# Patient Record
Sex: Female | Born: 1950 | Race: White | Hispanic: No | Marital: Married | Smoking: Former smoker
Health system: Southern US, Community
[De-identification: ages and names within clinical notes are randomized; demographics above are authoritative.]

## PROBLEM LIST (undated history)

## (undated) DIAGNOSIS — I10 Essential (primary) hypertension: Secondary | ICD-10-CM

## (undated) DIAGNOSIS — D649 Anemia, unspecified: Secondary | ICD-10-CM

## (undated) DIAGNOSIS — M545 Low back pain, unspecified: Secondary | ICD-10-CM

## (undated) DIAGNOSIS — K219 Gastro-esophageal reflux disease without esophagitis: Secondary | ICD-10-CM

## (undated) DIAGNOSIS — E785 Hyperlipidemia, unspecified: Secondary | ICD-10-CM

## (undated) DIAGNOSIS — F419 Anxiety disorder, unspecified: Secondary | ICD-10-CM

## (undated) DIAGNOSIS — G4700 Insomnia, unspecified: Secondary | ICD-10-CM

## (undated) DIAGNOSIS — G459 Transient cerebral ischemic attack, unspecified: Secondary | ICD-10-CM

## (undated) DIAGNOSIS — E538 Deficiency of other specified B group vitamins: Secondary | ICD-10-CM

## (undated) DIAGNOSIS — E559 Vitamin D deficiency, unspecified: Secondary | ICD-10-CM

## (undated) DIAGNOSIS — K589 Irritable bowel syndrome without diarrhea: Secondary | ICD-10-CM

## (undated) HISTORY — PX: BACK SURGERY: SHX140

## (undated) HISTORY — DX: Anxiety disorder, unspecified: F41.9

## (undated) HISTORY — PX: TONSILLECTOMY: SUR1361

## (undated) HISTORY — DX: Gastro-esophageal reflux disease without esophagitis: K21.9

## (undated) HISTORY — DX: Vitamin D deficiency, unspecified: E55.9

## (undated) HISTORY — DX: Deficiency of other specified B group vitamins: E53.8

## (undated) HISTORY — DX: Insomnia, unspecified: G47.00

## (undated) HISTORY — DX: Transient cerebral ischemic attack, unspecified: G45.9

## (undated) HISTORY — DX: Low back pain, unspecified: M54.50

## (undated) HISTORY — DX: Hyperlipidemia, unspecified: E78.5

## (undated) HISTORY — DX: Low back pain: M54.5

## (undated) HISTORY — DX: Essential (primary) hypertension: I10

## (undated) HISTORY — DX: Irritable bowel syndrome, unspecified: K58.9

## (undated) HISTORY — PX: ORIF RADIUS & ULNA FRACTURES: SHX2129

## (undated) HISTORY — PX: HERNIA MESH REMOVAL: SHX1745

## (undated) HISTORY — PX: OTHER SURGICAL HISTORY: SHX169

---

## 1995-01-09 HISTORY — PX: BREAST EXCISIONAL BIOPSY: SUR124

## 1998-12-05 ENCOUNTER — Other Ambulatory Visit: Admission: RE | Admit: 1998-12-05 | Discharge: 1998-12-05 | Payer: Self-pay | Admitting: Family Medicine

## 1998-12-26 ENCOUNTER — Ambulatory Visit (HOSPITAL_COMMUNITY): Admission: RE | Admit: 1998-12-26 | Discharge: 1998-12-26 | Payer: Self-pay | Admitting: Gastroenterology

## 1999-11-13 ENCOUNTER — Other Ambulatory Visit: Admission: RE | Admit: 1999-11-13 | Discharge: 1999-11-13 | Payer: Self-pay | Admitting: Internal Medicine

## 2000-04-30 ENCOUNTER — Encounter: Payer: Self-pay | Admitting: Gastroenterology

## 2000-04-30 ENCOUNTER — Encounter: Admission: RE | Admit: 2000-04-30 | Discharge: 2000-04-30 | Payer: Self-pay | Admitting: Gastroenterology

## 2000-10-04 ENCOUNTER — Ambulatory Visit (HOSPITAL_COMMUNITY): Admission: RE | Admit: 2000-10-04 | Discharge: 2000-10-04 | Payer: Self-pay | Admitting: Family Medicine

## 2000-10-04 ENCOUNTER — Encounter: Payer: Self-pay | Admitting: Family Medicine

## 2002-09-04 ENCOUNTER — Ambulatory Visit (HOSPITAL_COMMUNITY): Admission: RE | Admit: 2002-09-04 | Discharge: 2002-09-04 | Payer: Self-pay | Admitting: Gastroenterology

## 2003-01-15 ENCOUNTER — Other Ambulatory Visit: Admission: RE | Admit: 2003-01-15 | Discharge: 2003-01-15 | Payer: Self-pay | Admitting: Family Medicine

## 2003-08-26 ENCOUNTER — Ambulatory Visit (HOSPITAL_COMMUNITY): Admission: RE | Admit: 2003-08-26 | Discharge: 2003-08-26 | Payer: Self-pay | Admitting: Family Medicine

## 2003-10-11 ENCOUNTER — Encounter: Admission: RE | Admit: 2003-10-11 | Discharge: 2003-10-11 | Payer: Self-pay | Admitting: Family Medicine

## 2003-12-23 ENCOUNTER — Encounter: Admission: RE | Admit: 2003-12-23 | Discharge: 2003-12-23 | Payer: Self-pay | Admitting: Family Medicine

## 2004-01-20 ENCOUNTER — Other Ambulatory Visit: Admission: RE | Admit: 2004-01-20 | Discharge: 2004-01-20 | Payer: Self-pay | Admitting: Family Medicine

## 2004-08-03 ENCOUNTER — Encounter: Admission: RE | Admit: 2004-08-03 | Discharge: 2004-08-03 | Payer: Self-pay | Admitting: Family Medicine

## 2005-01-19 ENCOUNTER — Other Ambulatory Visit: Admission: RE | Admit: 2005-01-19 | Discharge: 2005-01-19 | Payer: Self-pay | Admitting: Family Medicine

## 2005-06-25 ENCOUNTER — Encounter: Admission: RE | Admit: 2005-06-25 | Discharge: 2005-06-25 | Payer: Self-pay | Admitting: Orthopaedic Surgery

## 2005-08-07 ENCOUNTER — Encounter: Admission: RE | Admit: 2005-08-07 | Discharge: 2005-08-07 | Payer: Self-pay | Admitting: Family Medicine

## 2006-01-22 ENCOUNTER — Encounter: Admission: RE | Admit: 2006-01-22 | Discharge: 2006-01-22 | Payer: Self-pay | Admitting: Gastroenterology

## 2006-01-22 ENCOUNTER — Other Ambulatory Visit: Admission: RE | Admit: 2006-01-22 | Discharge: 2006-01-22 | Payer: Self-pay | Admitting: Family Medicine

## 2006-11-29 ENCOUNTER — Encounter: Admission: RE | Admit: 2006-11-29 | Discharge: 2006-11-29 | Payer: Self-pay | Admitting: Family Medicine

## 2007-01-30 ENCOUNTER — Other Ambulatory Visit: Admission: RE | Admit: 2007-01-30 | Discharge: 2007-01-30 | Payer: Self-pay | Admitting: Family Medicine

## 2008-02-03 ENCOUNTER — Other Ambulatory Visit: Admission: RE | Admit: 2008-02-03 | Discharge: 2008-02-03 | Payer: Self-pay | Admitting: Family Medicine

## 2008-02-24 ENCOUNTER — Encounter: Admission: RE | Admit: 2008-02-24 | Discharge: 2008-02-24 | Payer: Self-pay | Admitting: Family Medicine

## 2008-11-08 ENCOUNTER — Inpatient Hospital Stay (HOSPITAL_COMMUNITY): Admission: AC | Admit: 2008-11-08 | Discharge: 2008-11-15 | Payer: Self-pay

## 2008-11-10 ENCOUNTER — Ambulatory Visit: Payer: Self-pay | Admitting: Physical Medicine & Rehabilitation

## 2009-02-04 ENCOUNTER — Other Ambulatory Visit: Admission: RE | Admit: 2009-02-04 | Discharge: 2009-02-04 | Payer: Self-pay | Admitting: Family Medicine

## 2009-02-24 ENCOUNTER — Ambulatory Visit (HOSPITAL_COMMUNITY): Admission: RE | Admit: 2009-02-24 | Discharge: 2009-02-24 | Payer: Self-pay | Admitting: Neurosurgery

## 2009-02-25 ENCOUNTER — Encounter (INDEPENDENT_AMBULATORY_CARE_PROVIDER_SITE_OTHER): Payer: Self-pay | Admitting: Neurosurgery

## 2009-03-11 ENCOUNTER — Encounter: Admission: RE | Admit: 2009-03-11 | Discharge: 2009-03-11 | Payer: Self-pay | Admitting: Family Medicine

## 2009-03-15 ENCOUNTER — Encounter: Admission: RE | Admit: 2009-03-15 | Discharge: 2009-03-15 | Payer: Self-pay | Admitting: Family Medicine

## 2009-04-30 ENCOUNTER — Emergency Department (HOSPITAL_COMMUNITY): Admission: EM | Admit: 2009-04-30 | Discharge: 2009-04-30 | Payer: Self-pay | Admitting: Emergency Medicine

## 2010-02-03 ENCOUNTER — Other Ambulatory Visit: Payer: Self-pay | Admitting: Family Medicine

## 2010-02-03 DIAGNOSIS — Z1239 Encounter for other screening for malignant neoplasm of breast: Secondary | ICD-10-CM

## 2010-03-15 ENCOUNTER — Ambulatory Visit
Admission: RE | Admit: 2010-03-15 | Discharge: 2010-03-15 | Disposition: A | Discharge status: Home / Self Care | Payer: Commercial Indemnity | Source: Ambulatory Visit | Attending: Family Medicine | Admitting: Family Medicine

## 2010-03-15 DIAGNOSIS — Z1239 Encounter for other screening for malignant neoplasm of breast: Secondary | ICD-10-CM

## 2010-03-29 LAB — HEPATIC FUNCTION PANEL
ALT: 16 U/L (ref 0–35)
AST: 16 U/L (ref 0–37)
Albumin: 4.5 g/dL (ref 3.5–5.2)
Alkaline Phosphatase: 55 U/L (ref 39–117)
Bilirubin, Direct: 0.2 mg/dL (ref 0.0–0.3)
Indirect Bilirubin: 0.2 mg/dL — ABNORMAL LOW (ref 0.3–0.9)
Total Bilirubin: 0.4 mg/dL (ref 0.3–1.2)
Total Protein: 6.9 g/dL (ref 6.0–8.3)

## 2010-03-29 LAB — CBC
HCT: 44 % (ref 36.0–46.0)
Hemoglobin: 15.4 g/dL — ABNORMAL HIGH (ref 12.0–15.0)
MCHC: 34.8 g/dL (ref 30.0–36.0)
MCV: 105 fL — ABNORMAL HIGH (ref 78.0–100.0)
Platelets: 274 10*3/uL (ref 150–400)
RBC: 4.2 MIL/uL (ref 3.87–5.11)
RDW: 14.1 % (ref 11.5–15.5)
WBC: 7 10*3/uL (ref 4.0–10.5)

## 2010-03-29 LAB — BASIC METABOLIC PANEL
BUN: 5 mg/dL — ABNORMAL LOW (ref 6–23)
CO2: 27 mEq/L (ref 19–32)
Calcium: 9.5 mg/dL (ref 8.4–10.5)
Chloride: 92 mEq/L — ABNORMAL LOW (ref 96–112)
Creatinine, Ser: 0.65 mg/dL (ref 0.4–1.2)
GFR calc Af Amer: >60 mL/min (ref >60)
GFR calc non Af Amer: >60 mL/min (ref >60)
Glucose, Bld: 105 mg/dL — ABNORMAL HIGH (ref 70–99)
Potassium: 4.3 mEq/L (ref 3.5–5.1)
Sodium: 128 mEq/L — ABNORMAL LOW (ref 135–145)

## 2010-03-29 LAB — SURGICAL PCR SCREEN
MRSA, PCR: NEGATIVE
Staphylococcus aureus: NEGATIVE

## 2010-03-29 LAB — DIFFERENTIAL
Basophils Absolute: 0 10*3/uL (ref 0.0–0.1)
Basophils Relative: 0 % (ref 0–1)
Eosinophils Absolute: 0.5 10*3/uL (ref 0.0–0.7)
Eosinophils Relative: 7 % — ABNORMAL HIGH (ref 0–5)
Lymphocytes Relative: 18 % (ref 12–46)
Lymphs Abs: 1.3 10*3/uL (ref 0.7–4.0)
Monocytes Absolute: 0.3 10*3/uL (ref 0.1–1.0)
Monocytes Relative: 5 % (ref 3–12)
Neutro Abs: 4.8 10*3/uL (ref 1.7–7.7)
Neutrophils Relative %: 70 % (ref 43–77)

## 2010-04-12 LAB — CBC
HCT: 31.2 % — ABNORMAL LOW (ref 36.0–46.0)
HCT: 35.2 % — ABNORMAL LOW (ref 36.0–46.0)
HCT: 35.8 % — ABNORMAL LOW (ref 36.0–46.0)
HCT: 35.9 % — ABNORMAL LOW (ref 36.0–46.0)
HCT: 36 % (ref 36.0–46.0)
HCT: 36 % (ref 36.0–46.0)
HCT: 36.3 % (ref 36.0–46.0)
HCT: 36.5 % (ref 36.0–46.0)
HCT: 36.5 % (ref 36.0–46.0)
Hemoglobin: 11.2 g/dL — ABNORMAL LOW (ref 12.0–15.0)
Hemoglobin: 12.6 g/dL (ref 12.0–15.0)
Hemoglobin: 12.7 g/dL (ref 12.0–15.0)
Hemoglobin: 12.8 g/dL (ref 12.0–15.0)
Hemoglobin: 12.9 g/dL (ref 12.0–15.0)
Hemoglobin: 12.9 g/dL (ref 12.0–15.0)
Hemoglobin: 12.9 g/dL (ref 12.0–15.0)
Hemoglobin: 13 g/dL (ref 12.0–15.0)
Hemoglobin: 13 g/dL (ref 12.0–15.0)
MCHC: 35.4 g/dL (ref 30.0–36.0)
MCHC: 35.6 g/dL (ref 30.0–36.0)
MCHC: 35.6 g/dL (ref 30.0–36.0)
MCHC: 35.6 g/dL (ref 30.0–36.0)
MCHC: 35.7 g/dL (ref 30.0–36.0)
MCHC: 35.8 g/dL (ref 30.0–36.0)
MCHC: 35.8 g/dL (ref 30.0–36.0)
MCHC: 35.8 g/dL (ref 30.0–36.0)
MCHC: 35.9 g/dL (ref 30.0–36.0)
MCV: 103 fL — ABNORMAL HIGH (ref 78.0–100.0)
MCV: 103.5 fL — ABNORMAL HIGH (ref 78.0–100.0)
MCV: 103.7 fL — ABNORMAL HIGH (ref 78.0–100.0)
MCV: 103.7 fL — ABNORMAL HIGH (ref 78.0–100.0)
MCV: 103.8 fL — ABNORMAL HIGH (ref 78.0–100.0)
MCV: 104 fL — ABNORMAL HIGH (ref 78.0–100.0)
MCV: 104 fL — ABNORMAL HIGH (ref 78.0–100.0)
MCV: 104.4 fL — ABNORMAL HIGH (ref 78.0–100.0)
MCV: 104.8 fL — ABNORMAL HIGH (ref 78.0–100.0)
Platelets: 136 10*3/uL — ABNORMAL LOW (ref 150–400)
Platelets: 152 10*3/uL (ref 150–400)
Platelets: 158 10*3/uL (ref 150–400)
Platelets: 160 10*3/uL (ref 150–400)
Platelets: 161 10*3/uL (ref 150–400)
Platelets: 168 10*3/uL (ref 150–400)
Platelets: 177 10*3/uL (ref 150–400)
Platelets: 180 10*3/uL (ref 150–400)
Platelets: 181 10*3/uL (ref 150–400)
RBC: 3.01 MIL/uL — ABNORMAL LOW (ref 3.87–5.11)
RBC: 3.4 MIL/uL — ABNORMAL LOW (ref 3.87–5.11)
RBC: 3.43 MIL/uL — ABNORMAL LOW (ref 3.87–5.11)
RBC: 3.46 MIL/uL — ABNORMAL LOW (ref 3.87–5.11)
RBC: 3.47 MIL/uL — ABNORMAL LOW (ref 3.87–5.11)
RBC: 3.47 MIL/uL — ABNORMAL LOW (ref 3.87–5.11)
RBC: 3.47 MIL/uL — ABNORMAL LOW (ref 3.87–5.11)
RBC: 3.51 MIL/uL — ABNORMAL LOW (ref 3.87–5.11)
RBC: 3.55 MIL/uL — ABNORMAL LOW (ref 3.87–5.11)
RDW: 13.4 % (ref 11.5–15.5)
RDW: 13.4 % (ref 11.5–15.5)
RDW: 13.5 % (ref 11.5–15.5)
RDW: 13.6 % (ref 11.5–15.5)
RDW: 13.8 % (ref 11.5–15.5)
RDW: 13.8 % (ref 11.5–15.5)
RDW: 13.9 % (ref 11.5–15.5)
RDW: 13.9 % (ref 11.5–15.5)
RDW: 14.3 % (ref 11.5–15.5)
WBC: 5.4 10*3/uL (ref 4.0–10.5)
WBC: 5.4 10*3/uL (ref 4.0–10.5)
WBC: 5.6 10*3/uL (ref 4.0–10.5)
WBC: 6.1 10*3/uL (ref 4.0–10.5)
WBC: 6.2 10*3/uL (ref 4.0–10.5)
WBC: 6.4 10*3/uL (ref 4.0–10.5)
WBC: 6.9 10*3/uL (ref 4.0–10.5)
WBC: 7.2 10*3/uL (ref 4.0–10.5)
WBC: 7.6 10*3/uL (ref 4.0–10.5)

## 2010-04-12 LAB — BASIC METABOLIC PANEL
BUN: 12 mg/dL (ref 6–23)
BUN: 3 mg/dL — ABNORMAL LOW (ref 6–23)
CO2: 23 mEq/L (ref 19–32)
CO2: 28 mEq/L (ref 19–32)
Calcium: 8 mg/dL — ABNORMAL LOW (ref 8.4–10.5)
Calcium: 8.4 mg/dL (ref 8.4–10.5)
Chloride: 103 mEq/L (ref 96–112)
Chloride: 107 mEq/L (ref 96–112)
Creatinine, Ser: 0.63 mg/dL (ref 0.4–1.2)
Creatinine, Ser: 0.65 mg/dL (ref 0.4–1.2)
GFR calc Af Amer: >60 mL/min (ref >60)
GFR calc Af Amer: >60 mL/min (ref >60)
GFR calc non Af Amer: >60 mL/min (ref >60)
GFR calc non Af Amer: >60 mL/min (ref >60)
Glucose, Bld: 109 mg/dL — ABNORMAL HIGH (ref 70–99)
Glucose, Bld: 136 mg/dL — ABNORMAL HIGH (ref 70–99)
Potassium: 3.7 mEq/L (ref 3.5–5.1)
Potassium: 4 mEq/L (ref 3.5–5.1)
Sodium: 136 mEq/L (ref 135–145)
Sodium: 137 mEq/L (ref 135–145)

## 2010-04-12 LAB — DIFFERENTIAL
Basophils Absolute: 0 10*3/uL (ref 0.0–0.1)
Basophils Relative: 0 % (ref 0–1)
Eosinophils Absolute: 0.1 10*3/uL (ref 0.0–0.7)
Eosinophils Relative: 2 % (ref 0–5)
Lymphocytes Relative: 13 % (ref 12–46)
Lymphs Abs: 0.7 10*3/uL (ref 0.7–4.0)
Monocytes Absolute: 0.2 10*3/uL (ref 0.1–1.0)
Monocytes Relative: 4 % (ref 3–12)
Neutro Abs: 4.4 10*3/uL (ref 1.7–7.7)
Neutrophils Relative %: 81 % — ABNORMAL HIGH (ref 43–77)

## 2010-04-12 LAB — MRSA PCR SCREENING: MRSA by PCR: NEGATIVE

## 2010-04-12 LAB — GLUCOSE, CAPILLARY: Glucose-Capillary: 130 mg/dL — ABNORMAL HIGH (ref 70–99)

## 2010-04-13 LAB — TYPE AND SCREEN
ABO/RH(D): O POS
Antibody Screen: NEGATIVE

## 2010-04-13 LAB — COMPREHENSIVE METABOLIC PANEL
ALT: 136 U/L — ABNORMAL HIGH (ref 0–35)
AST: 182 U/L — ABNORMAL HIGH (ref 0–37)
Albumin: 4.1 g/dL (ref 3.5–5.2)
Alkaline Phosphatase: 46 U/L (ref 39–117)
BUN: 15 mg/dL (ref 6–23)
CO2: 23 mEq/L (ref 19–32)
Calcium: 8.9 mg/dL (ref 8.4–10.5)
Chloride: 102 mEq/L (ref 96–112)
Creatinine, Ser: 0.89 mg/dL (ref 0.4–1.2)
GFR calc Af Amer: >60 mL/min (ref >60)
GFR calc non Af Amer: >60 mL/min (ref >60)
Glucose, Bld: 117 mg/dL — ABNORMAL HIGH (ref 70–99)
Potassium: 3.8 mEq/L (ref 3.5–5.1)
Sodium: 136 mEq/L (ref 135–145)
Total Bilirubin: 0.5 mg/dL (ref 0.3–1.2)
Total Protein: 6.4 g/dL (ref 6.0–8.3)

## 2010-04-13 LAB — CBC
HCT: 43.2 % (ref 36.0–46.0)
Hemoglobin: 15.3 g/dL — ABNORMAL HIGH (ref 12.0–15.0)
MCHC: 35.3 g/dL (ref 30.0–36.0)
MCV: 105.3 fL — ABNORMAL HIGH (ref 78.0–100.0)
Platelets: 253 10*3/uL (ref 150–400)
RBC: 4.1 MIL/uL (ref 3.87–5.11)
RDW: 14 % (ref 11.5–15.5)
WBC: 15.2 10*3/uL — ABNORMAL HIGH (ref 4.0–10.5)

## 2010-04-13 LAB — POCT I-STAT, CHEM 8
BUN: 18 mg/dL (ref 6–23)
Calcium, Ion: 0.8 mmol/L — ABNORMAL LOW (ref 1.12–1.32)
Chloride: 105 meq/L (ref 96–112)
Creatinine, Ser: 0.6 mg/dL (ref 0.4–1.2)
Glucose, Bld: 105 mg/dL — ABNORMAL HIGH (ref 70–99)
HCT: 44 % (ref 36.0–46.0)
Hemoglobin: 15 g/dL (ref 12.0–15.0)
Potassium: 4.1 mEq/L (ref 3.5–5.1)
Sodium: 132 mEq/L — ABNORMAL LOW (ref 135–145)
TCO2: 21 mmol/L (ref 0–100)

## 2010-04-13 LAB — PROTIME-INR
INR: 1.04 (ref 0.00–1.49)
Prothrombin Time: 13.5 seconds (ref 11.6–15.2)

## 2010-04-13 LAB — APTT: aPTT: 28 seconds (ref 24–37)

## 2010-04-13 LAB — LACTIC ACID, PLASMA: Lactic Acid, Venous: 2.6 mmol/L — ABNORMAL HIGH (ref 0.5–2.2)

## 2010-04-13 LAB — ABO/RH: ABO/RH(D): O POS

## 2010-05-26 NOTE — Op Note (Signed)
NAME:  Patricia [name], Patricia [name]                         ACCOUNT NO.:  [id number]   MEDICAL RECORD NO.:  [id number]                   PATIENT TYPE:  AMB   LOCATION:  ENDO                                 FACILITY:  [facility]   PHYSICIAN:  Petra Kuba, M.D.                 DATE OF BIRTH:  1950-10-11   DATE OF PROCEDURE:  09/04/2002  DATE OF DISCHARGE:                                 OPERATIVE REPORT   PROCEDURE:  Colonoscopy.   INDICATIONS FOR PROCEDURE:  Patient due for colonic screening, increased GI  symptoms probably related to her IBS, family history of colon polyps.   Consent was signed after risks, benefits, methods, and options were  thoroughly discussed in the office on multiple occasions.   MEDICINES USED:  Demerol 90, Versed 9.   DESCRIPTION OF PROCEDURE:  Rectal inspection was pertinent for small  external hemorrhoids. Digital exam was negative. The pediatric video  adjustable colonoscope was inserted and anal rectal pull through and  retroflexion confirmed some tiny to small hemorrhoids but no other anal  rectal abnormality. The scope was then straightened and despite a very  tortuous long looping colon was able to move into the cecum. This did  require various abdominal pressures and rolling her on her back. No obvious  abnormality was seen on insertion. The cecum was identified by the  appendiceal orifice and the ileocecal valve. In fact, the scope was inserted  a short ways into the terminal ileum which was normal. Photo documentation  was obtained. The scope was slowly withdrawn.  On slow withdrawal through  the colon, no abnormalities were seen. When we fell back around the loop, we  did try to readvance around the curves to decrease chances of missing things  but on slow withdrawal back to the rectum no abnormalities were seen. Again  anal rectal pull through and retroflexion did not reveal any additional  findings other than some tiny to small hemorrhoids. The scope was  reinserted  a short ways up the left side of the colon, air was suctioned, scope  removed. The patient tolerated the procedure well. There was no obvious or  immediate complications.   ENDOSCOPIC DIAGNOSIS:  1. Internal and external small hemorrhoids.  2. Long looping tortuous colon.  3. Otherwise within normal limits to the terminal ileum.    PLAN:  Happy to see back p.r.n., restart her usual medicines, consider  surgical consultation if her hemorrhoids continue to bother her. Repeat  screening probably in five years.                                               Petra Kuba, M.D.    MEM/MEDQ  D:  09/04/2002  T:  09/05/2002  Job:  [id number]   cc:  Holley Bouche, M.D.  [address]  [place], [place] [address]  Fax: [phone]

## 2010-12-11 ENCOUNTER — Other Ambulatory Visit: Payer: Self-pay | Admitting: Family Medicine

## 2010-12-11 DIAGNOSIS — N644 Mastodynia: Secondary | ICD-10-CM

## 2010-12-11 DIAGNOSIS — M542 Cervicalgia: Secondary | ICD-10-CM

## 2010-12-11 DIAGNOSIS — G8929 Other chronic pain: Secondary | ICD-10-CM

## 2010-12-18 ENCOUNTER — Ambulatory Visit
Admission: RE | Admit: 2010-12-18 | Discharge: 2010-12-18 | Disposition: A | Discharge status: Home / Self Care | Payer: Managed Care, Other (non HMO) | Source: Ambulatory Visit | Attending: Family Medicine | Admitting: Family Medicine

## 2010-12-18 DIAGNOSIS — G8929 Other chronic pain: Secondary | ICD-10-CM

## 2010-12-20 ENCOUNTER — Ambulatory Visit
Admission: RE | Admit: 2010-12-20 | Discharge: 2010-12-20 | Disposition: A | Discharge status: Home / Self Care | Payer: Managed Care, Other (non HMO) | Source: Ambulatory Visit | Attending: Family Medicine | Admitting: Family Medicine

## 2010-12-20 ENCOUNTER — Other Ambulatory Visit: Payer: Self-pay | Admitting: Family Medicine

## 2010-12-20 DIAGNOSIS — N644 Mastodynia: Secondary | ICD-10-CM

## 2011-09-21 ENCOUNTER — Other Ambulatory Visit: Payer: Self-pay | Admitting: Family Medicine

## 2011-09-21 DIAGNOSIS — Z1231 Encounter for screening mammogram for malignant neoplasm of breast: Secondary | ICD-10-CM

## 2011-10-16 ENCOUNTER — Ambulatory Visit
Admission: RE | Admit: 2011-10-16 | Discharge: 2011-10-16 | Disposition: A | Discharge status: Home / Self Care | Payer: Managed Care, Other (non HMO) | Source: Ambulatory Visit | Attending: Family Medicine | Admitting: Family Medicine

## 2011-10-16 DIAGNOSIS — Z1231 Encounter for screening mammogram for malignant neoplasm of breast: Secondary | ICD-10-CM

## 2012-02-18 ENCOUNTER — Other Ambulatory Visit (HOSPITAL_COMMUNITY)
Admission: RE | Admit: 2012-02-18 | Discharge: 2012-02-18 | Disposition: A | Discharge status: Home / Self Care | Payer: BC Managed Care – PPO | Source: Ambulatory Visit | Attending: Family Medicine | Admitting: Family Medicine

## 2012-02-18 ENCOUNTER — Other Ambulatory Visit: Payer: Self-pay | Admitting: Family Medicine

## 2012-02-18 DIAGNOSIS — Z01419 Encounter for gynecological examination (general) (routine) without abnormal findings: Secondary | ICD-10-CM | POA: Insufficient documentation

## 2012-09-16 ENCOUNTER — Other Ambulatory Visit: Payer: Self-pay

## 2012-09-16 DIAGNOSIS — Z1231 Encounter for screening mammogram for malignant neoplasm of breast: Secondary | ICD-10-CM

## 2012-10-02 ENCOUNTER — Other Ambulatory Visit: Payer: Self-pay | Admitting: Gastroenterology

## 2012-10-16 ENCOUNTER — Ambulatory Visit
Admission: RE | Admit: 2012-10-16 | Discharge: 2012-10-16 | Disposition: A | Discharge status: Home / Self Care | Payer: BC Managed Care – PPO | Source: Ambulatory Visit

## 2012-10-16 DIAGNOSIS — Z1231 Encounter for screening mammogram for malignant neoplasm of breast: Secondary | ICD-10-CM

## 2013-10-09 ENCOUNTER — Other Ambulatory Visit: Payer: Self-pay

## 2013-10-09 DIAGNOSIS — Z1231 Encounter for screening mammogram for malignant neoplasm of breast: Secondary | ICD-10-CM

## 2013-10-23 ENCOUNTER — Ambulatory Visit
Admission: RE | Admit: 2013-10-23 | Discharge: 2013-10-23 | Disposition: A | Discharge status: Home / Self Care | Payer: BC Managed Care – PPO | Source: Ambulatory Visit

## 2013-10-23 DIAGNOSIS — Z1231 Encounter for screening mammogram for malignant neoplasm of breast: Secondary | ICD-10-CM

## 2014-09-24 ENCOUNTER — Other Ambulatory Visit: Payer: Self-pay

## 2014-09-24 DIAGNOSIS — Z1231 Encounter for screening mammogram for malignant neoplasm of breast: Secondary | ICD-10-CM

## 2014-09-28 ENCOUNTER — Other Ambulatory Visit: Payer: Self-pay | Admitting: Family Medicine

## 2014-09-28 DIAGNOSIS — E2839 Other primary ovarian failure: Secondary | ICD-10-CM

## 2014-10-27 ENCOUNTER — Ambulatory Visit
Admission: RE | Admit: 2014-10-27 | Discharge: 2014-10-27 | Disposition: A | Discharge status: Home / Self Care | Payer: Medicare Other | Source: Ambulatory Visit

## 2014-10-27 DIAGNOSIS — Z1231 Encounter for screening mammogram for malignant neoplasm of breast: Secondary | ICD-10-CM

## 2014-11-03 ENCOUNTER — Ambulatory Visit
Admission: RE | Admit: 2014-11-03 | Discharge: 2014-11-03 | Disposition: A | Discharge status: Home / Self Care | Payer: Medicare Other | Source: Ambulatory Visit | Attending: Family Medicine | Admitting: Family Medicine

## 2014-11-03 DIAGNOSIS — E2839 Other primary ovarian failure: Secondary | ICD-10-CM

## 2014-12-15 ENCOUNTER — Ambulatory Visit
Admission: RE | Admit: 2014-12-15 | Discharge: 2014-12-15 | Disposition: A | Discharge status: Home / Self Care | Payer: Medicare Other | Source: Ambulatory Visit | Attending: Family Medicine | Admitting: Family Medicine

## 2015-03-11 ENCOUNTER — Other Ambulatory Visit: Payer: Self-pay | Admitting: Family Medicine

## 2015-03-11 ENCOUNTER — Other Ambulatory Visit (HOSPITAL_COMMUNITY)
Admission: RE | Admit: 2015-03-11 | Discharge: 2015-03-11 | Disposition: A | Discharge status: Home / Self Care | Payer: Medicare Other | Source: Ambulatory Visit | Attending: Family Medicine | Admitting: Family Medicine

## 2015-03-11 DIAGNOSIS — Z124 Encounter for screening for malignant neoplasm of cervix: Secondary | ICD-10-CM | POA: Diagnosis present

## 2015-03-14 LAB — CYTOLOGY - PAP

## 2015-09-27 ENCOUNTER — Other Ambulatory Visit: Payer: Self-pay | Admitting: Family Medicine

## 2015-09-27 DIAGNOSIS — Z1231 Encounter for screening mammogram for malignant neoplasm of breast: Secondary | ICD-10-CM

## 2015-10-28 ENCOUNTER — Ambulatory Visit
Admission: RE | Admit: 2015-10-28 | Discharge: 2015-10-28 | Disposition: A | Discharge status: Home / Self Care | Payer: Medicare Other | Source: Ambulatory Visit | Attending: Family Medicine | Admitting: Family Medicine

## 2015-10-28 DIAGNOSIS — Z1231 Encounter for screening mammogram for malignant neoplasm of breast: Secondary | ICD-10-CM

## 2016-01-27 DIAGNOSIS — K581 Irritable bowel syndrome with constipation: Secondary | ICD-10-CM | POA: Diagnosis not present

## 2016-03-19 DIAGNOSIS — E559 Vitamin D deficiency, unspecified: Secondary | ICD-10-CM | POA: Diagnosis not present

## 2016-03-19 DIAGNOSIS — K581 Irritable bowel syndrome with constipation: Secondary | ICD-10-CM | POA: Diagnosis not present

## 2016-03-19 DIAGNOSIS — M545 Low back pain: Secondary | ICD-10-CM | POA: Diagnosis not present

## 2016-03-19 DIAGNOSIS — E538 Deficiency of other specified B group vitamins: Secondary | ICD-10-CM | POA: Diagnosis not present

## 2016-03-19 DIAGNOSIS — Z0000 Encounter for general adult medical examination without abnormal findings: Secondary | ICD-10-CM | POA: Diagnosis not present

## 2016-03-19 DIAGNOSIS — I10 Essential (primary) hypertension: Secondary | ICD-10-CM | POA: Diagnosis not present

## 2016-03-19 DIAGNOSIS — K219 Gastro-esophageal reflux disease without esophagitis: Secondary | ICD-10-CM | POA: Diagnosis not present

## 2016-03-19 DIAGNOSIS — E785 Hyperlipidemia, unspecified: Secondary | ICD-10-CM | POA: Diagnosis not present

## 2016-03-19 DIAGNOSIS — R1031 Right lower quadrant pain: Secondary | ICD-10-CM | POA: Diagnosis not present

## 2016-05-21 DIAGNOSIS — H5201 Hypermetropia, right eye: Secondary | ICD-10-CM | POA: Diagnosis not present

## 2016-05-21 DIAGNOSIS — H353131 Nonexudative age-related macular degeneration, bilateral, early dry stage: Secondary | ICD-10-CM | POA: Diagnosis not present

## 2016-05-21 DIAGNOSIS — H2513 Age-related nuclear cataract, bilateral: Secondary | ICD-10-CM | POA: Diagnosis not present

## 2016-05-21 DIAGNOSIS — H52222 Regular astigmatism, left eye: Secondary | ICD-10-CM | POA: Diagnosis not present

## 2016-05-21 DIAGNOSIS — H524 Presbyopia: Secondary | ICD-10-CM | POA: Diagnosis not present

## 2016-05-21 DIAGNOSIS — H04123 Dry eye syndrome of bilateral lacrimal glands: Secondary | ICD-10-CM | POA: Diagnosis not present

## 2016-08-15 DIAGNOSIS — J208 Acute bronchitis due to other specified organisms: Secondary | ICD-10-CM | POA: Diagnosis not present

## 2016-09-19 ENCOUNTER — Other Ambulatory Visit: Payer: Self-pay | Admitting: Family Medicine

## 2016-09-19 DIAGNOSIS — I10 Essential (primary) hypertension: Secondary | ICD-10-CM | POA: Diagnosis not present

## 2016-09-19 DIAGNOSIS — G4700 Insomnia, unspecified: Secondary | ICD-10-CM | POA: Diagnosis not present

## 2016-09-19 DIAGNOSIS — M545 Low back pain: Secondary | ICD-10-CM | POA: Diagnosis not present

## 2016-09-19 DIAGNOSIS — K5900 Constipation, unspecified: Secondary | ICD-10-CM | POA: Diagnosis not present

## 2016-09-19 DIAGNOSIS — E785 Hyperlipidemia, unspecified: Secondary | ICD-10-CM | POA: Diagnosis not present

## 2016-09-19 DIAGNOSIS — Z1231 Encounter for screening mammogram for malignant neoplasm of breast: Secondary | ICD-10-CM

## 2016-09-19 DIAGNOSIS — Z23 Encounter for immunization: Secondary | ICD-10-CM | POA: Diagnosis not present

## 2016-09-19 DIAGNOSIS — K219 Gastro-esophageal reflux disease without esophagitis: Secondary | ICD-10-CM | POA: Diagnosis not present

## 2016-10-05 DIAGNOSIS — J209 Acute bronchitis, unspecified: Secondary | ICD-10-CM | POA: Diagnosis not present

## 2016-10-29 ENCOUNTER — Ambulatory Visit: Payer: Medicare Other

## 2016-10-29 ENCOUNTER — Ambulatory Visit
Admission: RE | Admit: 2016-10-29 | Discharge: 2016-10-29 | Disposition: A | Discharge status: Home / Self Care | Payer: Medicare HMO | Source: Ambulatory Visit | Attending: Family Medicine | Admitting: Family Medicine

## 2016-10-29 DIAGNOSIS — Z1231 Encounter for screening mammogram for malignant neoplasm of breast: Secondary | ICD-10-CM

## 2016-11-15 DIAGNOSIS — M9901 Segmental and somatic dysfunction of cervical region: Secondary | ICD-10-CM | POA: Diagnosis not present

## 2016-11-15 DIAGNOSIS — M9902 Segmental and somatic dysfunction of thoracic region: Secondary | ICD-10-CM | POA: Diagnosis not present

## 2016-11-15 DIAGNOSIS — M5384 Other specified dorsopathies, thoracic region: Secondary | ICD-10-CM | POA: Diagnosis not present

## 2016-11-15 DIAGNOSIS — M5412 Radiculopathy, cervical region: Secondary | ICD-10-CM | POA: Diagnosis not present

## 2016-11-19 DIAGNOSIS — M9901 Segmental and somatic dysfunction of cervical region: Secondary | ICD-10-CM | POA: Diagnosis not present

## 2016-11-19 DIAGNOSIS — M5384 Other specified dorsopathies, thoracic region: Secondary | ICD-10-CM | POA: Diagnosis not present

## 2016-11-19 DIAGNOSIS — M5412 Radiculopathy, cervical region: Secondary | ICD-10-CM | POA: Diagnosis not present

## 2016-11-19 DIAGNOSIS — M9902 Segmental and somatic dysfunction of thoracic region: Secondary | ICD-10-CM | POA: Diagnosis not present

## 2016-11-22 DIAGNOSIS — M5384 Other specified dorsopathies, thoracic region: Secondary | ICD-10-CM | POA: Diagnosis not present

## 2016-11-22 DIAGNOSIS — M9901 Segmental and somatic dysfunction of cervical region: Secondary | ICD-10-CM | POA: Diagnosis not present

## 2016-11-22 DIAGNOSIS — M5412 Radiculopathy, cervical region: Secondary | ICD-10-CM | POA: Diagnosis not present

## 2016-11-22 DIAGNOSIS — M9902 Segmental and somatic dysfunction of thoracic region: Secondary | ICD-10-CM | POA: Diagnosis not present

## 2017-01-03 DIAGNOSIS — J209 Acute bronchitis, unspecified: Secondary | ICD-10-CM | POA: Diagnosis not present

## 2017-02-21 DIAGNOSIS — J209 Acute bronchitis, unspecified: Secondary | ICD-10-CM | POA: Diagnosis not present

## 2017-03-20 DIAGNOSIS — Z1159 Encounter for screening for other viral diseases: Secondary | ICD-10-CM | POA: Diagnosis not present

## 2017-03-20 DIAGNOSIS — K219 Gastro-esophageal reflux disease without esophagitis: Secondary | ICD-10-CM | POA: Diagnosis not present

## 2017-03-20 DIAGNOSIS — M545 Low back pain: Secondary | ICD-10-CM | POA: Diagnosis not present

## 2017-03-20 DIAGNOSIS — F411 Generalized anxiety disorder: Secondary | ICD-10-CM | POA: Diagnosis not present

## 2017-03-20 DIAGNOSIS — G4700 Insomnia, unspecified: Secondary | ICD-10-CM | POA: Diagnosis not present

## 2017-03-20 DIAGNOSIS — K581 Irritable bowel syndrome with constipation: Secondary | ICD-10-CM | POA: Diagnosis not present

## 2017-03-20 DIAGNOSIS — J300 Vasomotor rhinitis: Secondary | ICD-10-CM | POA: Diagnosis not present

## 2017-03-20 DIAGNOSIS — I10 Essential (primary) hypertension: Secondary | ICD-10-CM | POA: Diagnosis not present

## 2017-03-20 DIAGNOSIS — Z0000 Encounter for general adult medical examination without abnormal findings: Secondary | ICD-10-CM | POA: Diagnosis not present

## 2017-03-20 DIAGNOSIS — E559 Vitamin D deficiency, unspecified: Secondary | ICD-10-CM | POA: Diagnosis not present

## 2017-05-27 DIAGNOSIS — H52222 Regular astigmatism, left eye: Secondary | ICD-10-CM | POA: Diagnosis not present

## 2017-05-27 DIAGNOSIS — H35033 Hypertensive retinopathy, bilateral: Secondary | ICD-10-CM | POA: Diagnosis not present

## 2017-05-27 DIAGNOSIS — H2513 Age-related nuclear cataract, bilateral: Secondary | ICD-10-CM | POA: Diagnosis not present

## 2017-05-27 DIAGNOSIS — H524 Presbyopia: Secondary | ICD-10-CM | POA: Diagnosis not present

## 2017-05-27 DIAGNOSIS — H353131 Nonexudative age-related macular degeneration, bilateral, early dry stage: Secondary | ICD-10-CM | POA: Diagnosis not present

## 2017-05-27 DIAGNOSIS — H04123 Dry eye syndrome of bilateral lacrimal glands: Secondary | ICD-10-CM | POA: Diagnosis not present

## 2017-09-19 ENCOUNTER — Other Ambulatory Visit: Payer: Self-pay | Admitting: Family Medicine

## 2017-09-19 DIAGNOSIS — Z1231 Encounter for screening mammogram for malignant neoplasm of breast: Secondary | ICD-10-CM

## 2017-09-24 DIAGNOSIS — G4700 Insomnia, unspecified: Secondary | ICD-10-CM | POA: Diagnosis not present

## 2017-09-24 DIAGNOSIS — M545 Low back pain: Secondary | ICD-10-CM | POA: Diagnosis not present

## 2017-09-24 DIAGNOSIS — K5901 Slow transit constipation: Secondary | ICD-10-CM | POA: Diagnosis not present

## 2017-09-24 DIAGNOSIS — I10 Essential (primary) hypertension: Secondary | ICD-10-CM | POA: Diagnosis not present

## 2017-10-08 DIAGNOSIS — G459 Transient cerebral ischemic attack, unspecified: Secondary | ICD-10-CM

## 2017-10-08 HISTORY — DX: Transient cerebral ischemic attack, unspecified: G45.9

## 2017-10-14 DIAGNOSIS — R202 Paresthesia of skin: Secondary | ICD-10-CM | POA: Diagnosis not present

## 2017-10-14 DIAGNOSIS — G459 Transient cerebral ischemic attack, unspecified: Secondary | ICD-10-CM | POA: Diagnosis not present

## 2017-10-14 DIAGNOSIS — E538 Deficiency of other specified B group vitamins: Secondary | ICD-10-CM | POA: Diagnosis not present

## 2017-10-15 ENCOUNTER — Other Ambulatory Visit: Payer: Self-pay | Admitting: Family Medicine

## 2017-10-15 DIAGNOSIS — R200 Anesthesia of skin: Secondary | ICD-10-CM

## 2017-10-15 DIAGNOSIS — R531 Weakness: Secondary | ICD-10-CM

## 2017-10-16 ENCOUNTER — Ambulatory Visit
Admission: RE | Admit: 2017-10-16 | Discharge: 2017-10-16 | Disposition: A | Discharge status: Home / Self Care | Payer: Medicare HMO | Source: Ambulatory Visit | Attending: Family Medicine | Admitting: Family Medicine

## 2017-10-16 DIAGNOSIS — R531 Weakness: Secondary | ICD-10-CM

## 2017-10-16 DIAGNOSIS — R200 Anesthesia of skin: Secondary | ICD-10-CM

## 2017-10-16 DIAGNOSIS — I63532 Cerebral infarction due to unspecified occlusion or stenosis of left posterior cerebral artery: Secondary | ICD-10-CM | POA: Diagnosis not present

## 2017-10-16 MED ORDER — GADOBENATE DIMEGLUMINE 529 MG/ML IV SOLN
15.0000 mL | Freq: Once | INTRAVENOUS | Status: AC | PRN
  Administered 2017-10-16: 15 mL via INTRAVENOUS

## 2017-10-18 ENCOUNTER — Encounter: Payer: Self-pay | Admitting: *Deleted

## 2017-10-18 ENCOUNTER — Encounter: Payer: Self-pay | Admitting: Diagnostic Neuroimaging

## 2017-10-18 ENCOUNTER — Ambulatory Visit: Payer: Medicare HMO | Admitting: Diagnostic Neuroimaging

## 2017-10-18 VITALS — BP 119/74 | HR 80 | Ht 67.5 in | Wt 169.0 lb

## 2017-10-18 DIAGNOSIS — I63432 Cerebral infarction due to embolism of left posterior cerebral artery: Secondary | ICD-10-CM | POA: Diagnosis not present

## 2017-10-18 MED ORDER — ATORVASTATIN CALCIUM 20 MG PO TABS: 20.0000 mg | ORAL_TABLET | Freq: Every day | ORAL | 6 refills | Status: DC

## 2017-10-18 MED ORDER — ASPIRIN 81 MG PO TABS: 81.0000 mg | ORAL_TABLET | Freq: Every day | ORAL | Status: DC

## 2017-10-18 NOTE — Progress Notes (Signed)
[facility]  PATIENT: Patricia [name] DOB: Jun 13, 1950  REFERRING CLINICIAN: Harris  HISTORY FROM: patient  REASON FOR VISIT: new consult    HISTORICAL  CHIEF COMPLAINT:  Chief Complaint  Patient presents with  . New Patient (Initial Visit)    Rm 7, husband  . Dr. Orlie Pollen    CVA, intermittent RU, LE numbness and weakness since last Wednesday.      HISTORY OF PRESENT ILLNESS:   67 year old female here for evaluation of stroke.  Patient has history of tobacco abuse, hypertension, insomnia, snoring.  Last Wednesday patient had 1 to 2-minute episode of right tongue, right face and right arm numbness.  On Thursday she had 2 more similar events each lasting 10 minutes.  On Friday she had right face arm and leg numbness and weakness lasting for 40 minutes.  On Saturday patient had another episode lasting for 5 minutes.  On Sunday he had another episode lasting for 5 minutes.  She spoke with her daughter who recommended she take an aspirin.  On Monday she went to PCP for evaluation and MRI of the brain was ordered.  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  Patient was started on aspirin and Plavix and referred here for further evaluation.  Patient denies any chest pain, shortness of breath, palpitations or history of atrial fibrillation.   REVIEW OF SYSTEMS: Full 14 system review of systems performed and negative with exception of: Constipation back pain neck pain frequent waking snoring.  ALLERGIES: Allergies  Allergen Reactions  . Augmentin [Amoxicillin-Pot Clavulanate]     GI upset  . Levaquin [Levofloxacin In D5w]     Joint problems   . Mom [Magnesium Hydroxide]     Welts   . Oxycodone     Nausea     HOME MEDICATIONS: Outpatient Medications Prior to Visit  Medication Sig Dispense Refill  . aspirin 325 MG tablet Take 325 mg by mouth daily.    . carboxymethylcellulose (REFRESH PLUS)  0.5 % SOLN 1 drop 3 (three) times daily as needed.    . carisoprodol (SOMA) 350 MG tablet TK 1 T PO TID.  5  . clonazePAM (KLONOPIN) 1 MG tablet TK 1 T PO QD HS  3  . clopidogrel (PLAVIX) 75 MG tablet     . diclofenac (VOLTAREN) 75 MG EC tablet Take 75 mg by mouth 2 (two) times daily.    . fluticasone (FLONASE) 50 MCG/ACT nasal spray U 2 SPRAYS IEN ONCE D  5  . gabapentin (NEURONTIN) 600 MG tablet Take 600 mg by mouth 2 (two) times daily.     Marland Kitchen ipratropium (ATROVENT) 0.06 % nasal spray     . LINZESS 290 MCG CAPS capsule TK ONE C PO ONCE A DAY WITH FIRST MEAL  6  . losartan (COZAAR) 100 MG tablet     . norethindrone-ethinyl estradiol (FEMHRT 1/5) 1-5 MG-MCG TABS tablet TK 1 T PO QD  6  . omeprazole (PRILOSEC) 20 MG capsule     . polyethylene glycol (MIRALAX / GLYCOLAX) packet Take 17 g by mouth daily.    . traMADol (ULTRAM) 50 MG tablet TK 1 T PO TID PRF PAIN  4  . VENTOLIN HFA 108 (90 Base) MCG/ACT inhaler INL 2 PFS PO Q 6 H PRN  5  . Vitamin D, Ergocalciferol, 2000 units CAPS Take by mouth.    . zaleplon (SONATA) 10 MG capsule Take 10 mg by mouth at  bedtime as needed for sleep.     No facility-administered medications prior to visit.     PAST MEDICAL HISTORY: Past Medical History:  Diagnosis Date  . Anxiety   . GERD (gastroesophageal reflux disease)   . Hyperlipidemia   . Hypertension   . IBS (irritable bowel syndrome)   . Insomnia   . Low back pain   . TIA (transient ischemic attack)   . Vitamin B12 deficiency   . Vitamin D deficiency     PAST SURGICAL HISTORY: Past Surgical History:  Procedure Laterality Date  . BACK SURGERY    . BREAST EXCISIONAL BIOPSY Left 1997   benign  . hemorrhoidecotmy    . HERNIA MESH REMOVAL    . ORIF RADIUS & ULNA FRACTURES      FAMILY HISTORY: No family history on file.  SOCIAL HISTORY: Social History   Socioeconomic History  . Marital status: Married    Spouse name: Not on file  . Number of children: Not on file  . Years of  education: Not on file  . Highest education level: Not on file  Occupational History  . Not on file  Social Needs  . Financial resource strain: Not on file  . Food insecurity:    Worry: Not on file    Inability: Not on file  . Transportation needs:    Medical: Not on file    Non-medical: Not on file  Tobacco Use  . Smoking status: Current Every Day Smoker    Packs/day: 1.00  . Smokeless tobacco: Never Used  Substance and Sexual Activity  . Alcohol use: Yes    Comment: occ once every 2 months  . Drug use: Never  . Sexual activity: Not on file  Lifestyle  . Physical activity:    Days per week: Not on file    Minutes per session: Not on file  . Stress: Not on file  Relationships  . Social connections:    Talks on phone: Not on file    Gets together: Not on file    Attends religious service: Not on file    Active member of club or organization: Not on file    Attends meetings of clubs or organizations: Not on file    Relationship status: Not on file  . Intimate partner violence:    Fear of current or ex partner: Not on file    Emotionally abused: Not on file    Physically abused: Not on file    Forced sexual activity: Not on file  Other Topics Concern  . Not on file  Social History Narrative   Live with husband.  Education HS.  Retired.  Children 2 (daughter). 1 cup coffee.       PHYSICAL EXAM  GENERAL EXAM/CONSTITUTIONAL: Vitals:  Vitals:   10/18/17 0841  BP: 119/74  Pulse: 80  Weight: 169 lb (76.7 kg)  Height: 5' 7.5" (1.715 m)     Body mass index is 26.08 kg/m. Wt Readings from Last 3 Encounters:  10/18/17 169 lb (76.7 kg)     Patient is in no distress; well developed, nourished and groomed; neck is supple  CARDIOVASCULAR:  Examination of carotid arteries is normal; no carotid bruits  Regular rate and rhythm, no murmurs  Examination of peripheral vascular system by observation and palpation is normal  EYES:  Ophthalmoscopic exam of optic  discs and posterior segments is normal; no papilledema or hemorrhages  Visual Acuity Screening   Right eye Left eye Both eyes  Without correction: 20/40 20/40   With correction:        MUSCULOSKELETAL:  Gait, strength, tone, movements noted in Neurologic exam below  NEUROLOGIC: MENTAL STATUS:  No flowsheet data found.  awake, alert, oriented to person, place and time  recent and remote memory intact  normal attention and concentration  language fluent, comprehension intact, naming intact  fund of knowledge appropriate  CRANIAL NERVE:   2nd - no papilledema on fundoscopic exam  2nd, 3rd, 4th, 6th - pupils equal and reactive to light, visual fields full to confrontation, extraocular muscles intact, no nystagmus  5th - facial sensation symmetric  7th - facial strength symmetric  8th - hearing intact  9th - palate elevates symmetrically, uvula midline  11th - shoulder shrug symmetric  12th - tongue protrusion midline  MOTOR:   normal bulk and tone, full strength in the BUE, BLE  SENSORY:   normal and symmetric to light touch, temperature, vibration  COORDINATION:   finger-nose-finger, fine finger movements normal  REFLEXES:   deep tendon reflexes present and symmetric  GAIT/STATION:   narrow based gait     DIAGNOSTIC DATA (LABS, IMAGING, TESTING) - I reviewed patient records, labs, notes, testing and imaging myself where available.  Lab Results  Component Value Date   WBC 7.0 02/22/2009   HGB 15.4 (H) 02/22/2009   HCT 44.0 02/22/2009   MCV 105.0 (H) 02/22/2009   PLT 274 02/22/2009      Component Value Date/Time   NA 128 (L) 02/22/2009 1429   K 4.3 02/22/2009 1429   CL 92 (L) 02/22/2009 1429   CO2 27 02/22/2009 1429   GLUCOSE 105 (H) 02/22/2009 1429   BUN 5 (L) 02/22/2009 1429   CREATININE 0.65 02/22/2009 1429   CALCIUM 9.5 02/22/2009 1429   PROT 6.9 02/22/2009 1429   ALBUMIN 4.5 02/22/2009 1429   AST 16 02/22/2009 1429   ALT 16  02/22/2009 1429   ALKPHOS 55 02/22/2009 1429   BILITOT 0.4 02/22/2009 1429   GFRNONAA >60 02/22/2009 1429   GFRAA  02/22/2009 1429    >60        The eGFR has been calculated using the MDRD equation. This calculation has not been validated in all clinical situations. eGFR's persistently <60 mL/min signify possible Chronic Kidney Disease.   No results found for: CHOL, HDL, LDLCALC, LDLDIRECT, TRIG, CHOLHDL No results found for: HGBA1C No results found for: VITAMINB12 No results found for: TSH   10/16/17 MRI brain / MRA head [I reviewed images myself and agree with interpretation. -VRP]  1. Cluster of small acute infarcts in the left occipital cortex. 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 2. Advanced right P3 branch narrowing. 3. No flow limiting stenosis or infarct in the anterior circulation.    ASSESSMENT AND PLAN  67 y.o. year old female here with intermittent episodes of right face, arm, leg numbness and weakness, sometimes associated with visual disturbance, here for evaluation of stroke.  On neuroanatomic basis, TIAs could have been triggered by left thalamic ischemia, arising from left PCA stenosis.  Patient had some visual disturbance as well and embolic phenomenon to the left occipital lobe could be responsible.  Will complete work-up with MRA of the neck and echocardiogram.  Also will check sleep study.    Patient currently on aspirin 325 mg plus Plavix 75 mg daily (per PCP), but we will change this to aspirin 81 mg with Plavix 75 mg daily, as this would have similar stroke  prevention benefit slightly less bleeding risk.  She can take this for 3 months and then released to single antiplatelet agent Plavix 75 mg daily alone.  Also advised patient to stop smoking immediately to reduce risk of stroke.   Dx:  1. 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  - check A1c diabetes screening per PCP - check MRA neck w/wo - check TTE - check sleep study (snoring, HTN, stroke; eval for OSA)  Orders Placed This Encounter  Procedures  . MR MRA NECK W WO CONTRAST  . Ambulatory referral to Sleep Studies  . ECHOCARDIOGRAM COMPLETE   Meds ordered this encounter  Medications  . aspirin 81 MG tablet    Sig: Take 1 tablet (81 mg total) by mouth daily.    Dispense:  30 tablet  . atorvastatin (LIPITOR) 20 MG tablet    Sig: Take 1 tablet (20 mg total) by mouth daily.    Dispense:  30 tablet    Refill:  6   Return in about 6 months (around 04/19/2018).  I reviewed images, labs, notes, records myself. I summarized findings and reviewed with patient, for this high risk condition (stroke) requiring high complexity decision making.    VIKRPenni Bombard 10/181/27/[id number] in Neurology, Neurophysiology and Neuroimaging  [facility] [address] [phone]

## 2017-10-18 NOTE — Patient Instructions (Signed)
-   continue aspirin 81mg  + plavix 75mg  daily x 3 months; then reduce to plavix 75mg  alone  - continue BP control  - stop smoking  - start atorvastatin 20mg  daily; follow up with Dr. Kenton Kingfisher  - check MRA neck w/wo  - check TTE (echocardiogram)  - check sleep study consult

## 2017-10-21 ENCOUNTER — Telehealth: Payer: Self-pay | Admitting: Diagnostic Neuroimaging

## 2017-10-21 NOTE — Telephone Encounter (Signed)
Mcarthur Rossetti Josem Kaufmann: [id number] (exp. 10/21/17 to 11/20/17) order sent to GI. They will reach out to the pt to schedule.

## 2017-10-21 NOTE — Telephone Encounter (Signed)
Pt is aware of this if she has not heard anything in the next 2-3 business days to give them at call. I also gave her their number of [phone].

## 2017-10-23 ENCOUNTER — Ambulatory Visit
Admission: RE | Admit: 2017-10-23 | Discharge: 2017-10-23 | Disposition: A | Discharge status: Home / Self Care | Payer: Medicare HMO | Source: Ambulatory Visit | Attending: Diagnostic Neuroimaging | Admitting: Diagnostic Neuroimaging

## 2017-10-23 DIAGNOSIS — I63432 Cerebral infarction due to embolism of left posterior cerebral artery: Secondary | ICD-10-CM | POA: Diagnosis not present

## 2017-10-23 MED ORDER — GADOBENATE DIMEGLUMINE 529 MG/ML IV SOLN
15.0000 mL | Freq: Once | INTRAVENOUS | Status: AC | PRN
  Administered 2017-10-23: 15 mL via INTRAVENOUS

## 2017-10-25 ENCOUNTER — Ambulatory Visit (HOSPITAL_COMMUNITY): Payer: Medicare HMO | Attending: Diagnostic Neuroimaging

## 2017-10-25 ENCOUNTER — Other Ambulatory Visit: Payer: Self-pay

## 2017-10-25 DIAGNOSIS — I63432 Cerebral infarction due to embolism of left posterior cerebral artery: Secondary | ICD-10-CM | POA: Diagnosis not present

## 2017-10-30 ENCOUNTER — Telehealth: Payer: Self-pay | Admitting: *Deleted

## 2017-10-30 NOTE — Telephone Encounter (Signed)
LVM informing the patient that her echocardiogram was an unremarkable study with no major findings. Advised her MRA neck showed unremarkable imaging results. Reviewed Dr Gladstone Lighter plan and left number for any questions.

## 2017-10-31 ENCOUNTER — Ambulatory Visit
Admission: RE | Admit: 2017-10-31 | Discharge: 2017-10-31 | Disposition: A | Discharge status: Home / Self Care | Payer: Medicare HMO | Source: Ambulatory Visit | Attending: Family Medicine | Admitting: Family Medicine

## 2017-10-31 DIAGNOSIS — Z1231 Encounter for screening mammogram for malignant neoplasm of breast: Secondary | ICD-10-CM

## 2017-10-31 NOTE — Telephone Encounter (Signed)
Pt has called RN Stanton Kidney C back, she is asking for a call back

## 2017-10-31 NOTE — Telephone Encounter (Addendum)
Returned call form patient who stated she wanted ot be sure her record didn't state she is diabetic. This RN reviewed Dr Gladstone Lighter note specifically: check A1c diabetes screening per PCP.  She stated she does get that screening done regularly. This RN assured her the chart does not state she is a diabetic.  She verbalized understanding, appreciation.

## 2017-12-09 ENCOUNTER — Ambulatory Visit: Payer: Medicare HMO | Admitting: Neurology

## 2017-12-09 ENCOUNTER — Encounter: Payer: Self-pay | Admitting: Neurology

## 2017-12-09 VITALS — BP 132/87 | HR 81 | Ht 67.75 in | Wt 170.0 lb

## 2017-12-09 DIAGNOSIS — R0683 Snoring: Secondary | ICD-10-CM | POA: Diagnosis not present

## 2017-12-09 DIAGNOSIS — E663 Overweight: Secondary | ICD-10-CM

## 2017-12-09 DIAGNOSIS — I63432 Cerebral infarction due to embolism of left posterior cerebral artery: Secondary | ICD-10-CM
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
## 2017-12-09 NOTE — Patient Instructions (Signed)
Thank you for choosing [facility] for your sleep related care! It was nice to meet you today! I appreciate that you entrust me with your sleep related healthcare concerns. I hope, I was able to address at least some of your concerns today, and that I can help you feel reassured and also get better.    Here is what we discussed today and what we came up with as our plan for you:    Based on your history of stroke, I believe you should have a sleep study to rule out obstructive sleep apnea (aka OSA), and I think we should proceed with a sleep study to determine whether you do or do not have OSA and how severe it is. Even, if you have mild OSA, I may want you to consider treatment with CPAP, as treatment of even borderline or mild sleep apnea can result and improvement of symptoms such as sleep disruption, daytime sleepiness, nighttime bathroom breaks, restless leg symptoms, improvement of headache syndromes, even improved mood disorder.   Please remember, the long-term risks and ramifications of untreated moderate to severe obstructive sleep apnea are: increased Cardiovascular disease, including congestive heart failure, stroke, difficult to control hypertension, treatment resistant obesity, arrhythmias, especially irregular heartbeat commonly known as A. Fib. (atrial fibrillation); even type 2 diabetes has been linked to untreated OSA.   Sleep apnea can cause disruption of sleep and sleep deprivation in most cases, which, in turn, can cause recurrent headaches, problems with memory, mood, concentration, focus, and vigilance. Most people with untreated sleep apnea report excessive daytime sleepiness, which can affect their ability to drive. Please do not drive if you feel sleepy. Patients with sleep apnea developed difficulty initiating and maintaining sleep (aka insomnia).   Having sleep apnea may increase your risk for other sleep disorders, including involuntary behaviors sleep such  as sleep terrors, sleep talking, sleepwalking.    Having sleep apnea can also increase your risk for restless leg syndrome and leg movements at night.   Please note that untreated obstructive sleep apnea may carry additional perioperative morbidity. Patients with significant obstructive sleep apnea (typically, in the moderate to severe degree) should receive, if possible, perioperative PAP (positive airway pressure) therapy and the surgeons and particularly the anesthesiologists should be informed of the diagnosis and the severity of the sleep disordered breathing.   I will likely see you back after your sleep study to go over the test results and where to go from there. We will call you after your sleep study to advise about the results (most likely, you will hear from Webb, my nurse) and to set up an appointment at the time, as necessary.    Our sleep lab administrative assistant will call you to schedule your sleep study and give you further instructions, regarding the check in process for the sleep study, arrival time, what to bring, when you can expect to leave after the study, etc., and to answer any other logistical questions you may have. If you don't hear back from her by about 2 weeks from now, please feel free to call her direct line at [phone] or you can call our general clinic number, or email [place] through My Chart.

## 2017-12-11 DIAGNOSIS — H35033 Hypertensive retinopathy, bilateral: Secondary | ICD-10-CM | POA: Diagnosis not present

## 2017-12-11 DIAGNOSIS — H2513 Age-related nuclear cataract, bilateral: Secondary | ICD-10-CM | POA: Diagnosis not present

## 2017-12-11 DIAGNOSIS — H04123 Dry eye syndrome of bilateral lacrimal glands: Secondary | ICD-10-CM | POA: Diagnosis not present

## 2017-12-11 DIAGNOSIS — H353131 Nonexudative age-related macular degeneration, bilateral, early dry stage: Secondary | ICD-10-CM | POA: Diagnosis not present

## 2018-01-20 DIAGNOSIS — J209 Acute bronchitis, unspecified: Secondary | ICD-10-CM | POA: Diagnosis not present

## 2018-02-28 DIAGNOSIS — J209 Acute bronchitis, unspecified: Secondary | ICD-10-CM | POA: Diagnosis not present

## 2018-03-12 DIAGNOSIS — H353131 Nonexudative age-related macular degeneration, bilateral, early dry stage: Secondary | ICD-10-CM | POA: Diagnosis not present

## 2018-03-12 DIAGNOSIS — H2513 Age-related nuclear cataract, bilateral: Secondary | ICD-10-CM | POA: Diagnosis not present

## 2018-03-12 DIAGNOSIS — H04123 Dry eye syndrome of bilateral lacrimal glands: Secondary | ICD-10-CM | POA: Diagnosis not present

## 2018-03-12 DIAGNOSIS — H53453 Other localized visual field defect, bilateral: Secondary | ICD-10-CM | POA: Diagnosis not present

## 2018-03-18 DIAGNOSIS — I10 Essential (primary) hypertension: Secondary | ICD-10-CM | POA: Diagnosis not present

## 2018-03-18 DIAGNOSIS — R42 Dizziness and giddiness: Secondary | ICD-10-CM | POA: Diagnosis not present

## 2018-03-18 DIAGNOSIS — F17210 Nicotine dependence, cigarettes, uncomplicated: Secondary | ICD-10-CM | POA: Diagnosis not present

## 2018-03-18 DIAGNOSIS — Z8673 Personal history of transient ischemic attack (TIA), and cerebral infarction without residual deficits: Secondary | ICD-10-CM | POA: Diagnosis not present

## 2018-03-18 DIAGNOSIS — R000 Tachycardia, unspecified: Secondary | ICD-10-CM | POA: Diagnosis not present

## 2018-04-03 DIAGNOSIS — K581 Irritable bowel syndrome with constipation: Secondary | ICD-10-CM | POA: Diagnosis not present

## 2018-04-03 DIAGNOSIS — F411 Generalized anxiety disorder: Secondary | ICD-10-CM | POA: Diagnosis not present

## 2018-04-03 DIAGNOSIS — I10 Essential (primary) hypertension: Secondary | ICD-10-CM | POA: Diagnosis not present

## 2018-04-03 DIAGNOSIS — L299 Pruritus, unspecified: Secondary | ICD-10-CM | POA: Diagnosis not present

## 2018-04-03 DIAGNOSIS — E559 Vitamin D deficiency, unspecified: Secondary | ICD-10-CM | POA: Diagnosis not present

## 2018-04-03 DIAGNOSIS — Z0000 Encounter for general adult medical examination without abnormal findings: Secondary | ICD-10-CM | POA: Diagnosis not present

## 2018-04-03 DIAGNOSIS — M545 Low back pain: Secondary | ICD-10-CM | POA: Diagnosis not present

## 2018-04-03 DIAGNOSIS — E538 Deficiency of other specified B group vitamins: Secondary | ICD-10-CM | POA: Diagnosis not present

## 2018-04-03 DIAGNOSIS — K5901 Slow transit constipation: Secondary | ICD-10-CM | POA: Diagnosis not present

## 2018-04-03 DIAGNOSIS — E7800 Pure hypercholesterolemia, unspecified: Secondary | ICD-10-CM | POA: Diagnosis not present

## 2018-04-20 NOTE — Progress Notes (Deleted)
Hello Patricia [name] continue yearly therapy just turned your microphone off there is ago.  We will return a video camera on [facility] [place]. [place] [address] [phone]     Virtual Visit via Telephone Note  I connected with Patricia [name] on 04/20/18 at 10:45 AM EDT by telephone located remotely within my own home and verified that I am speaking with the correct person using two identifiers who reports being located ***.    I discussed the limitations, risks, security and privacy concerns of performing an evaluation and management service by telephone and the availability of in person appointments. I also discussed with the patient that there may be a patient responsible charge related to this service. The patient expressed understanding and agreed to proceed.   History of Present Illness:  Patricia [name] is a 68 y.o. female who has been followed in this office for ***. *** was initially scheduled for face-to-face office visit today at this time but due to Patricia [name], face-to-face office visit rescheduled for non-face-to-face telephone visit.   No reoccurences  Stopped aspirin feb 11 plavix alone  Atorvastatin dr. Jeanette [name] satisfactory no change leg pain 20mg  every other day 20mg    Did not undergo sleep study - refuses at this time   Smoking 8 per day has been trying to decrease with nicorette gum. Was on a pack prior   3/8 tachycardic BP 97/77 HR 184 with SOB/lightheadedness - checked BP - has not experienced since. PCP will do EKG while happening   BP typically 140/84 HR 80s  Night sweats and hot flashes - femhrt - tried to stop but severely increased       Observations/Objective:    Assessment and Plan:   Follow Up Instructions:       I discussed the assessment and treatment plan with the patient.  The patient was provided an opportunity to ask questions and all were answered to their satisfaction. The patient agreed with  the plan and verbalized an understanding of the instructions.   I provided *** minutes of non-face-to-face time during this encounter.    Patricia [name], AGNP-BC  [facility] [address] [place], [place] [address]  Phone [phone] Fax [phone] Note: This document was prepared with digital dictation and possible smart phrase technology. Any transcriptional errors that result from this process are unintentional.

## 2018-04-21 ENCOUNTER — Ambulatory Visit (INDEPENDENT_AMBULATORY_CARE_PROVIDER_SITE_OTHER): Payer: Medicare HMO | Admitting: Adult Health

## 2018-04-21 ENCOUNTER — Other Ambulatory Visit: Payer: Self-pay

## 2018-04-21 ENCOUNTER — Encounter: Payer: Self-pay | Admitting: Adult Health

## 2018-04-21 DIAGNOSIS — E785 Hyperlipidemia, unspecified: Secondary | ICD-10-CM | POA: Diagnosis not present

## 2018-04-21 DIAGNOSIS — R000 Tachycardia, unspecified: Secondary | ICD-10-CM

## 2018-04-21 DIAGNOSIS — I63432 Cerebral infarction due to embolism of left posterior cerebral artery: Secondary | ICD-10-CM

## 2018-04-21 DIAGNOSIS — Z720 Tobacco use: Secondary | ICD-10-CM | POA: Diagnosis not present

## 2018-04-21 DIAGNOSIS — I10 Essential (primary) hypertension: Secondary | ICD-10-CM | POA: Diagnosis not present

## 2018-04-21 NOTE — Progress Notes (Signed)
[facility]  PATIENT: Patricia [name] DOB: [birth year]  REFERRING CLINICIAN: Harris  HISTORY FROM: patient  REASON FOR VISIT: Stroke follow-up   Virtual Visit via Video Note  I connected with Patricia [name] on 04/21/18 at 10:45 AM EDT by a video enabled telemedicine application located remotely in my own home and verified that I am speaking with the correct person using two identifiers who was located at their own home.   I discussed the limitations of evaluation and management by telemedicine and the availability of in person appointments. The patient expressed understanding and agreed to proceed.   HPI  Patricia [name] is a 68 y.o. female who has been followed in this office for left occipital ischemic infarct. She was initially scheduled for face-to-face office visit today at this time but due to [place], face-to-face office visit rescheduled for non-face-to-face telephone visit.   She was initially evaluated in this office by Dr. Leta Baptist on 10/18/2017 with office visit notes below.  She underwent echocardiogram and MRA which were unremarkable.  She has been stable since this time without any reoccurring or residual symptoms.  It was recommended DAPT for 3 months which was completed and continues on Plavix alone without side effects of bleeding or bruising.  She experienced statin myalgia on atorvastatin 20 mg daily and therefore recommended taking 20 mg every other day by her PCP.  She denies ongoing symptoms since reduction of dose.  She did have lab work obtained this month by PCP and per patient report, satisfactory (unable to see lab work as PCP at [place]).  She was evaluated by Dr. Rexene Alberts on 12/09/2017 for OSA evaluation and recommended sleep study but patient declined further evaluation at that time.  Blood pressure stable.  She continues tobacco use smoking approximately 8 cigarettes/day but continues to try to decrease use with assistance of Nicorette gum.  She  does report episode on 03/16/18 when she started to feel lightheaded with SOB and BP reading 97/77 and heart rate 184.  She continue to monitor throughout the day and normalized by the next day.  She denies feeling this sensation prior and has not experienced this since.  She denied any other associated symptom during this time.  Per review of her medication list, she is currently on norethindrone-ethinyl estradiol for management of hot flashes and night sweats and endorses being on this for "many years".  She has attempted to stop in the past but restarted due to increase in symptoms.  No further concerns at this time.      INITIAL VISIT 10/18/17 Copied from Dr. Gladstone Lighter office visit for reference purposes only   68 year old female here for evaluation of stroke.  Patient has history of tobacco abuse, hypertension, insomnia, snoring.  Last Wednesday patient had 1 to 2-minute episode of right tongue, right face and right arm numbness.  On Thursday she had 2 more similar events each lasting 10 minutes.  On Friday she had right face arm and leg numbness and weakness lasting for 40 minutes.  On Saturday patient had another episode lasting for 5 minutes.  On Sunday he had another episode lasting for 5 minutes.  She spoke with her daughter who recommended she take an aspirin.  On Monday she went to PCP for evaluation and MRI of the brain was ordered.  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  Patient was started on aspirin and Plavix and referred here for further evaluation.  Patient denies any chest pain, shortness of breath, palpitations or history of atrial fibrillation.   REVIEW OF SYSTEMS: Full 14 system review of systems performed and negative with exception of: Seasonal allergies   ALLERGIES: Allergies  Allergen Reactions  . Augmentin [Amoxicillin-Pot Clavulanate]     GI upset  . Levaquin [Levofloxacin In D5w]     Joint  problems   . Mom [Magnesium Hydroxide]     Welts   . Oxycodone     Nausea     HOME MEDICATIONS: Outpatient Medications Prior to Visit  Medication Sig Dispense Refill  . atorvastatin (LIPITOR) 20 MG tablet Take 1 tablet (20 mg total) by mouth daily. (Patient taking differently: Take 20 mg by mouth every other day. ) 30 tablet 6  . carboxymethylcellulose (REFRESH PLUS) 0.5 % SOLN 1 drop 3 (three) times daily as needed.    . carisoprodol (SOMA) 350 MG tablet TK 1 T PO TID.  5  . clonazePAM (KLONOPIN) 1 MG tablet TK 1 T PO QD HS  3  . clopidogrel (PLAVIX) 75 MG tablet     . fluticasone (FLONASE) 50 MCG/ACT nasal spray U 2 SPRAYS IEN ONCE D  5  . gabapentin (NEURONTIN) 600 MG tablet Take 600 mg by mouth 2 (two) times daily.     Marland Kitchen ipratropium (ATROVENT) 0.06 % nasal spray     . LINZESS 290 MCG CAPS capsule TK ONE C PO ONCE A DAY WITH FIRST MEAL  6  . losartan (COZAAR) 100 MG tablet     . norethindrone-ethinyl estradiol (FEMHRT 1/5) 1-5 MG-MCG TABS tablet TK 1 T PO QD  6  . omeprazole (PRILOSEC) 20 MG capsule     . polyethylene glycol (MIRALAX / GLYCOLAX) packet Take 17 g by mouth daily.    . traMADol (ULTRAM) 50 MG tablet TK 1 T PO TID PRF PAIN  4  . VENTOLIN HFA 108 (90 Base) MCG/ACT inhaler INL 2 PFS PO Q 6 H PRN  5  . Vitamin D, Ergocalciferol, 2000 units CAPS Take by mouth.    . zaleplon (SONATA) 10 MG capsule Take 10 mg by mouth at bedtime as needed for sleep.     No facility-administered medications prior to visit.     PAST MEDICAL HISTORY: Past Medical History:  Diagnosis Date  . Anxiety   . GERD (gastroesophageal reflux disease)   . Hyperlipidemia   . Hypertension   . IBS (irritable bowel syndrome)   . Insomnia   . Low back pain   . TIA (transient ischemic attack)   . Vitamin B12 deficiency   . Vitamin D deficiency     PAST SURGICAL HISTORY: Past Surgical History:  Procedure Laterality Date  . BACK SURGERY    . BREAST EXCISIONAL BIOPSY Left 1997   benign  .  hemorrhoidecotmy    . HERNIA MESH REMOVAL    . ORIF RADIUS & ULNA FRACTURES      FAMILY HISTORY: Family History  Problem Relation Age of Onset  . Breast cancer Sister   . Breast cancer Maternal Aunt   . Breast cancer Paternal Aunt     SOCIAL HISTORY: Social History   Socioeconomic History  . Marital status: Married    Spouse name: Not on file  . Number of children: Not on file  . Years of education: Not on file  . Highest education level: Not on file  Occupational History  . Not on file  Social Needs  . Financial resource strain: Not on  file  . Food insecurity:    Worry: Not on file    Inability: Not on file  . Transportation needs:    Medical: Not on file    Non-medical: Not on file  Tobacco Use  . Smoking status: Current Every Day Smoker    Packs/day: 1.00  . Smokeless tobacco: Never Used  Substance and Sexual Activity  . Alcohol use: Yes    Comment: occ once every 2 months  . Drug use: Never  . Sexual activity: Not on file  Lifestyle  . Physical activity:    Days per week: Not on file    Minutes per session: Not on file  . Stress: Not on file  Relationships  . Social connections:    Talks on phone: Not on file    Gets together: Not on file    Attends religious service: Not on file    Active member of club or organization: Not on file    Attends meetings of clubs or organizations: Not on file    Relationship status: Not on file  . Intimate partner violence:    Fear of current or ex partner: Not on file    Emotionally abused: Not on file    Physically abused: Not on file    Forced sexual activity: Not on file  Other Topics Concern  . Not on file  Social History Narrative   Live with husband.  Education HS.  Retired.  Children 2 (daughter). 1 cup coffee.       PHYSICAL EXAM  GENERAL EXAM/CONSTITUTIONAL:  *Limited exam due to visit type*   well developed pleasant middle-aged Caucasian female in no apparent distress   NEUROLOGIC: MENTAL  STATUS:   awake, alert, oriented to person, place and time  recent and remote memory intact  normal attention and concentration  language fluent, comprehension intact, naming intact  fund of knowledge appropriate  CRANIAL NERVE:   2nd - UTA  2nd, 3rd, 4th, 6th -  extraocular muscles intact, no nystagmus  5th - UTA  7th - facial strength symmetric  8th - hearing intact to voice  9th - UTA  11th - shoulder shrug symmetric  12th - tongue protrusion midline  MOTOR:   No evidence of weakness per drift assessment   SENSORY:   UTA - denies temperature variations or numbness/tingling   COORDINATION:   finger-nose-finger, fine finger movements normal  REFLEXES:   deep tendon reflexes present and symmetric  GAIT/STATION:   narrow based gait     DIAGNOSTIC DATA (LABS, IMAGING, TESTING) - I reviewed patient records, labs, notes, testing and imaging myself where available.  MR MRA NECK W WO CONTRAST 10/23/17 IMPRESSION:  Normal MRA neck (with and without).  ECHOCARDIOGRAM 10/25/17 Study Conclusions - Left ventricle: The cavity size was normal. Systolic function was   normal. The estimated ejection fraction was in the range of 55%   to 60%. Wall motion was normal; there were no regional wall   motion abnormalities. Doppler parameters are consistent with   abnormal left ventricular relaxation (grade 1 diastolic   dysfunction). There was no evidence of elevated ventricular   filling pressure by Doppler parameters. - Aortic valve: There was no regurgitation. - Mitral valve: There was trivial regurgitation. - Right ventricle: The cavity size was normal. Wall thickness was   normal. Systolic function was normal. - Tricuspid valve: There was mild regurgitation. - Pulmonary arteries: Systolic pressure was within the normal   range. - Inferior vena cava: The vessel  was normal in size. - Pericardium, extracardiac: There was no pericardial effusion.       Lab Results  Component Value Date   WBC 7.0 02/22/2009   HGB 15.4 (H) 02/22/2009   HCT 44.0 02/22/2009   MCV 105.0 (H) 02/22/2009   PLT 274 02/22/2009      Component Value Date/Time   NA 128 (L) 02/22/2009 1429   K 4.3 02/22/2009 1429   CL 92 (L) 02/22/2009 1429   CO2 27 02/22/2009 1429   GLUCOSE 105 (H) 02/22/2009 1429   BUN 5 (L) 02/22/2009 1429   CREATININE 0.65 02/22/2009 1429   CALCIUM 9.5 02/22/2009 1429   PROT 6.9 02/22/2009 1429   ALBUMIN 4.5 02/22/2009 1429   AST 16 02/22/2009 1429   ALT 16 02/22/2009 1429   ALKPHOS 55 02/22/2009 1429   BILITOT 0.4 02/22/2009 1429   GFRNONAA >60 02/22/2009 1429   GFRAA  02/22/2009 1429    >60        The eGFR has been calculated using the MDRD equation. This calculation has not been validated in all clinical situations. eGFR's persistently <60 mL/min signify possible Chronic Kidney Disease.   No results found for: CHOL, HDL, LDLCALC, LDLDIRECT, TRIG, CHOLHDL No results found for: HGBA1C No results found for: VITAMINB12 No results found for: TSH   10/16/17 MRI brain / MRA head [I reviewed images myself and agree with interpretation. -VRP]  1. Cluster of small acute infarcts in the left occipital cortex. 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 2. Advanced right P3 branch narrowing. 3. No flow limiting stenosis or infarct in the anterior circulation.      ASSESSMENT AND PLAN  68 y.o. year old female here with intermittent episodes of right face, arm, leg numbness and weakness, sometimes associated with visual disturbance, here for evaluation of stroke.  On neuroanatomic basis, TIAs could have been triggered by left thalamic ischemia, arising from left PCA stenosis.  Patient had some visual disturbance as well and embolic phenomenon to the left occipital lobe could be responsible.  Has been stable since prior visit without residual deficits, recurring or new stroke/TIA symptoms.  1. STROKE : Continue clopidogrel 75  mg daily  and lipitor  for secondary stroke prevention. Maintain strict control of hypertension with blood pressure goal below 130/90, diabetes with hemoglobin A1c goal below 6.5% and cholesterol with LDL cholesterol (bad cholesterol) goal below 70 mg/dL.  I also advised the patient to eat a healthy diet with plenty of whole grains, cereals, fruits and vegetables, exercise regularly with at least 30 minutes of continuous activity daily and maintain ideal body weight. 2. HTN: Advised to continue current treatment regimen.  Advised to continue to monitor at home along with continued follow-up with PCP for management 3. HLD: Advised to continue current treatment regimen along with continued follow-up with PCP for future prescribing and monitoring of lipid panel. If repeat levels elevated, may need to change statin medication as unable to tolerate increased dose of lipitor 4. ?PAF: Due to recent symptomatic tachycardic episode, recommend undergoing 30-day cardiac event monitor to assess for atrial fibrillation.  Order will be placed 5. ?OSA: Continues to decline sleep study at this time.  Educated on importance and risk of untreated sleep apnea and is aware to contact office if she would like to pursue further evaluation 6. Tobacco use: Highly encouraged smoking cessation with education of greatly increasing stroke risk with tobacco continuation   Follow up in 6 months or call earlier  if needed  Greater than 50% of time during this 25 minute visit was spent on counseling,explanation of diagnosis of stroke, reviewing risk factor management of HTN, HLD, and tobacco use, education provided regarding tobacco use and potential untreated OSA, need for cardiac evaluation,  planning of further management, discussion with patient and family and coordination of care     Venancio Poisson, AGNP-BC  [facility] [address] [place], [place] [address]  Phone [phone] Fax  [phone] Note: This document was prepared with digital dictation and possible smart phrase technology. Any transcriptional errors that result from this process are unintentional.

## 2018-04-28 NOTE — Progress Notes (Signed)
I reviewed note and agree with plan.   Penni Bombard, MD 5/87/2761, 8:48 AM Certified in Neurology, Neurophysiology and Neuroimaging  [facility] [address], [place], [place] [address] [phone]

## 2018-05-12 ENCOUNTER — Telehealth: Payer: Self-pay | Admitting: *Deleted

## 2018-05-12 NOTE — Telephone Encounter (Signed)
[employer] 30 day cardiac event monitor to be mailed to patients home.  Instruction reviewed briefly as they are included in monitor kit.

## 2018-05-14 ENCOUNTER — Encounter (INDEPENDENT_AMBULATORY_CARE_PROVIDER_SITE_OTHER): Payer: Medicare HMO

## 2018-05-14 DIAGNOSIS — I4891 Unspecified atrial fibrillation: Secondary | ICD-10-CM

## 2018-05-14 DIAGNOSIS — R000 Tachycardia, unspecified: Secondary | ICD-10-CM | POA: Diagnosis not present

## 2018-05-14 DIAGNOSIS — I639 Cerebral infarction, unspecified: Secondary | ICD-10-CM | POA: Diagnosis not present

## 2018-05-15 ENCOUNTER — Telehealth: Payer: Self-pay

## 2018-05-15 NOTE — Telephone Encounter (Signed)
Received serious notification from [employer] that on 05/14/2018 at 0506PM CST the patient had an autotriggered event of SVT. She is not currently a cardiac patient. The monitor was ordered by Neurology to r/o afib after a symptomatic event with elevated HR in March. Reviewed with Dr. Tamala Julian, who states the event was SVT. "R/o aflutter - doubt afib" The patient understands to continue to wear monitor and to call if she has any symptoms.

## 2018-05-16 ENCOUNTER — Telehealth: Payer: Self-pay

## 2018-05-16 NOTE — Telephone Encounter (Signed)
Received serious notification from [employer] that on 05/15/2018 at 8:57 PM the patient had an autotriggered event of PSVT. She is not currently a cardiac patient. The monitor was ordered by Neurology to r/o afib after a symptomatic event with elevated HR in March.  Reviewed with Dr. Nelda Severe to Pt not symptomatic (Pt denies feeling anything) with rate to continue to monitor.    The patient understands to continue to wear monitor and to call if she has any symptoms.

## 2018-05-19 ENCOUNTER — Other Ambulatory Visit: Payer: Self-pay | Admitting: Adult Health

## 2018-05-19 DIAGNOSIS — R000 Tachycardia, unspecified: Secondary | ICD-10-CM

## 2018-05-19 NOTE — Progress Notes (Signed)
Thank you for this update.  Referral was placed for evaluation by EP

## 2018-05-20 ENCOUNTER — Telehealth: Payer: Self-pay

## 2018-05-20 NOTE — Telephone Encounter (Signed)
Will send message to neurology to see if they would like to put in an order for EP referral.

## 2018-05-20 NOTE — Telephone Encounter (Signed)
Referral for EP evaluation was placed yesterday.  Please let me know if you have any further questions or concerns. Thank you, Janett Billow, NP

## 2018-05-21 DIAGNOSIS — Z03818 Encounter for observation for suspected exposure to other biological agents ruled out: Secondary | ICD-10-CM | POA: Diagnosis not present

## 2018-06-09 ENCOUNTER — Telehealth: Payer: Self-pay

## 2018-06-09 ENCOUNTER — Telehealth: Payer: Self-pay | Admitting: *Deleted

## 2018-06-09 NOTE — Telephone Encounter (Signed)

## 2018-06-09 NOTE — Telephone Encounter (Signed)
Received serious event from patient's monitor faxed by [employer]. Report analysis shows sinus rhythm, SVT (36 seconds) with PVCs (1 in 1 min)/Artifact at 4:52 PM on 06/06/18. Will review with DOD, Dr. Harrington Challenger. Patient stated she was cooking and eating around that time and did not feel anything. Dr. Harrington Challenger reviewed, no change, continue to monitor, and keep appt with week with Dr. Curt Bears.

## 2018-06-12 ENCOUNTER — Other Ambulatory Visit: Payer: Self-pay

## 2018-06-12 ENCOUNTER — Other Ambulatory Visit: Payer: Self-pay | Admitting: Adult Health

## 2018-06-12 DIAGNOSIS — R000 Tachycardia, unspecified: Secondary | ICD-10-CM

## 2018-06-12 DIAGNOSIS — I4891 Unspecified atrial fibrillation: Secondary | ICD-10-CM

## 2018-06-12 DIAGNOSIS — I639 Cerebral infarction, unspecified: Secondary | ICD-10-CM

## 2018-06-13 ENCOUNTER — Telehealth (INDEPENDENT_AMBULATORY_CARE_PROVIDER_SITE_OTHER): Payer: Medicare HMO | Admitting: Cardiology

## 2018-06-13 ENCOUNTER — Other Ambulatory Visit: Payer: Self-pay

## 2018-06-13 DIAGNOSIS — I471 Supraventricular tachycardia: Secondary | ICD-10-CM

## 2018-06-13 NOTE — Progress Notes (Signed)
Virtual Visit via Telephone Note   This visit type was conducted due to national recommendations for restrictions regarding the COVID-19 Pandemic (e.g. social distancing) in an effort to limit this patient's exposure and mitigate transmission in our community.  Due to her co-morbid illnesses, this patient is at least at moderate risk for complications without adequate follow up.  This format is felt to be most appropriate for this patient at this time.  The patient did not have access to video technology/had technical difficulties with video requiring transitioning to audio format only (telephone).  All issues noted in this document were discussed and addressed.  No physical exam could be performed with this format.  Please refer to the patient's chart for her  consent to telehealth for [facility].   Date:  06/13/2018   ID:  Patricia [name], DOB 05/01/1950, MRN [id number]   Patient Location: Home Provider Location: Home  PCP:  [name], MD  Cardiologist:  No primary care provider on file.  Electrophysiologist:  None   Evaluation Performed:  Consultation - Patricia [name] was referred by Venancio Poisson for the evaluation of SVT.  Chief Complaint:  SVT  History of Present Illness:    Patricia [name] is a 68 y.o. female with a history of hypertension, hyperlipidemia, and TIA.  She wore a cardiac monitor that showed evidence of SVT, but no atrial fibrillation was noted.She also smokes 8 cigarettes/day.  She had an episode 03/16/2018 when she was noted to feel lightheaded with shortness of breath.  Her blood pressure was 97/77 and her heart rate was reportedly 184 bpm. This episode lasted one hour.  Since that time, the patient has noted no further episodes of SVT.  She had 3 episodes while wearing a cardiac monitor.    The patient does not have symptoms concerning for COVID-19 infection (fever, chills, cough, or new shortness of breath).    Past Medical History:  Diagnosis Date   . Anxiety   . GERD (gastroesophageal reflux disease)   . Hyperlipidemia   . Hypertension   . IBS (irritable bowel syndrome)   . Insomnia   . Low back pain   . TIA (transient ischemic attack)   . Vitamin B12 deficiency   . Vitamin D deficiency    Past Surgical History:  Procedure Laterality Date  . BACK SURGERY    . BREAST EXCISIONAL BIOPSY Left 1997   benign  . hemorrhoidecotmy    . HERNIA MESH REMOVAL    . ORIF RADIUS & ULNA FRACTURES       Current Meds  Medication Sig  . atorvastatin (LIPITOR) 20 MG tablet Take 1 tablet (20 mg total) by mouth daily. (Patient taking differently: Take 20 mg by mouth every other day. )  . carboxymethylcellulose (REFRESH PLUS) 0.5 % SOLN 1 drop 3 (three) times daily as needed.  . carisoprodol (SOMA) 350 MG tablet TK 1 T PO TID.  Marland Kitchen clonazePAM (KLONOPIN) 1 MG tablet TK 1 T PO QD HS  . clopidogrel (PLAVIX) 75 MG tablet   . fluticasone (FLONASE) 50 MCG/ACT nasal spray U 2 SPRAYS IEN ONCE D  . gabapentin (NEURONTIN) 600 MG tablet Take 600 mg by mouth 2 (two) times daily.   Marland Kitchen ipratropium (ATROVENT) 0.06 % nasal spray   . LINZESS 290 MCG CAPS capsule TK ONE C PO ONCE A DAY WITH FIRST MEAL  . losartan (COZAAR) 100 MG tablet   . norethindrone-ethinyl estradiol (FEMHRT 1/5) 1-5 MG-MCG TABS tablet TK 1  T PO QD  . omeprazole (PRILOSEC) 20 MG capsule   . polyethylene glycol (MIRALAX / GLYCOLAX) packet Take 17 g by mouth daily.  . traMADol (ULTRAM) 50 MG tablet TK 1 T PO TID PRF PAIN  . VENTOLIN HFA 108 (90 Base) MCG/ACT inhaler INL 2 PFS PO Q 6 H PRN  . Vitamin D, Ergocalciferol, 2000 units CAPS Take by mouth.  . zaleplon (SONATA) 10 MG capsule Take 10 mg by mouth at bedtime as needed for sleep.     Allergies:   Augmentin [amoxicillin-pot clavulanate]; Levaquin [levofloxacin in d5w]; Mom [magnesium hydroxide]; and Oxycodone   Social History   Tobacco Use  . Smoking status: Current Every Day Smoker    Packs/day: 1.00  . Smokeless tobacco: Never Used   Substance Use Topics  . Alcohol use: Yes    Comment: occ once every 2 months  . Drug use: Never     Family Hx: The patient's family history includes Breast cancer in her maternal aunt, paternal aunt, and sister.  ROS:   Please see the history of present illness.     All other systems reviewed and are negative.   Prior CV studies:   The following studies were reviewed today:  TTE 10/25/17 - Left ventricle: The cavity size was normal. Systolic function was   normal. The estimated ejection fraction was in the range of 55%   to 60%. Wall motion was normal; there were no regional wall   motion abnormalities. Doppler parameters are consistent with   abnormal left ventricular relaxation (grade 1 diastolic   dysfunction). There was no evidence of elevated ventricular   filling pressure by Doppler parameters. - Aortic valve: There was no regurgitation. - Mitral valve: There was trivial regurgitation. - Right ventricle: The cavity size was normal. Wall thickness was   normal. Systolic function was normal. - Tricuspid valve: There was mild regurgitation. - Pulmonary arteries: Systolic pressure was within the normal   range. - Inferior vena cava: The vessel was normal in size. - Pericardium, extracardiac: There was no pericardial effusion.  Cardiac monitor 06/12/2018 personally reviewed Sinus rhythm with episodes of SVT Labs/Other Tests and Data Reviewed:    EKG:  No ECG reviewed.  Recent Labs: No results found for requested labs within last 8760 hours.   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  That being said, she certainly wishes to avoid medications or procedures.  I have discussed with her vagal maneuvers to try if she goes back into SVT. 2. Hypertension: Well-controlled 3. Hyperlipidemia: Plan per primary physician 4. CVA: Currently on Plavix and Lipitor.  Plan per neurology.  I Ayad Nieman discuss with them if they feel that Linq monitoring is indicated.  We Jennings Corado also check to see what the final results of her monitor showed.  COVID-19 Education: The signs and symptoms of COVID-19 were discussed with the patient and how to seek care for testing (follow up with PCP  or arrange E-visit).  The importance of social distancing was discussed today.  Time:   Today, I have spent 12 minutes with the patient with telehealth technology discussing the above problems.     Medication Adjustments/Labs and Tests Ordered: Current medicines are reviewed at length with the patient today.  Concerns regarding medicines are outlined above.   Tests Ordered: No orders of the defined types were placed in this encounter.   Medication Changes: No orders of the defined types were placed in this encounter.   Disposition:  Follow up in 1 year(s)  Signed, Aleyda Gindlesperger Meredith Leeds, MD  06/13/2018 9:10 AM    [place]

## 2018-06-18 NOTE — Progress Notes (Signed)
It looks as though her cardiac monitor was negative for atrial fibrillation but did show SVT.  I wonder if her symptoms are in relation to the SVT?  I do not think we need to pursue loop recorder from a stroke standpoint for longer duration monitoring may be beneficial from a cardiology standpoint.  Please let me know your thoughts regarding this.  I will forward you over the results note from cardiac monitor as I just received this today.

## 2018-06-20 NOTE — Progress Notes (Signed)
LINQ sounds reasonable. Fable Huisman discuss with the patient.

## 2018-06-23 NOTE — Progress Notes (Signed)
Sounds good.  Thank you for this update.

## 2018-08-14 ENCOUNTER — Telehealth: Payer: Self-pay | Admitting: *Deleted

## 2018-08-14 NOTE — Telephone Encounter (Signed)
Followed up with pt about ILR implant (delayed d/t COVID-19).   Pt reports only one episode since seeing Dr. Curt Bears on 6/5. States it was at night and she used cold water, "what Dr. Curt Bears told me to try", and it brought her HR down.  She doesn't feel that loop recorder is warranted at this time d/t infrequency of events and having to go through procedure during current pandemic along with monthly monitoring cost of ILR.  Informed pt of [employer] monitoring that she could purchase. She unfortunately doesn't own a smartphone. Advised to research b/c they carried a watch at one time and may be able to use that w/o smartphone. Pt appreciates the recommendation. Pt aware she will be considered PRN going forward w/ Dr. Curt Bears but we would gladly see her again, if necessary, in the future if events continue/become more frequent/worsen in nature.   Will forward to Dr. Curt Bears and Venancio Poisson, NP w/ neurology for their [facility], [place].

## 2018-08-18 NOTE — Telephone Encounter (Signed)
Thank you for this update.

## 2018-09-16 DIAGNOSIS — E7800 Pure hypercholesterolemia, unspecified: Secondary | ICD-10-CM | POA: Diagnosis not present

## 2018-09-16 DIAGNOSIS — I10 Essential (primary) hypertension: Secondary | ICD-10-CM | POA: Diagnosis not present

## 2018-09-16 DIAGNOSIS — G459 Transient cerebral ischemic attack, unspecified: Secondary | ICD-10-CM | POA: Diagnosis not present

## 2018-09-25 ENCOUNTER — Other Ambulatory Visit: Payer: Self-pay | Admitting: Family Medicine

## 2018-09-25 DIAGNOSIS — Z1231 Encounter for screening mammogram for malignant neoplasm of breast: Secondary | ICD-10-CM

## 2018-10-06 DIAGNOSIS — K581 Irritable bowel syndrome with constipation: Secondary | ICD-10-CM | POA: Diagnosis not present

## 2018-10-06 DIAGNOSIS — E7800 Pure hypercholesterolemia, unspecified: Secondary | ICD-10-CM | POA: Diagnosis not present

## 2018-10-06 DIAGNOSIS — K219 Gastro-esophageal reflux disease without esophagitis: Secondary | ICD-10-CM | POA: Diagnosis not present

## 2018-10-06 DIAGNOSIS — I10 Essential (primary) hypertension: Secondary | ICD-10-CM | POA: Diagnosis not present

## 2018-10-06 DIAGNOSIS — M545 Low back pain: Secondary | ICD-10-CM | POA: Diagnosis not present

## 2018-10-06 DIAGNOSIS — G4700 Insomnia, unspecified: Secondary | ICD-10-CM | POA: Diagnosis not present

## 2018-10-06 DIAGNOSIS — F411 Generalized anxiety disorder: Secondary | ICD-10-CM | POA: Diagnosis not present

## 2018-10-18 ENCOUNTER — Other Ambulatory Visit: Payer: Self-pay | Admitting: Diagnostic Neuroimaging

## 2018-10-22 ENCOUNTER — Telehealth: Payer: Self-pay

## 2018-10-22 NOTE — Telephone Encounter (Signed)
Opened in error

## 2018-10-23 ENCOUNTER — Ambulatory Visit: Payer: Medicare HMO | Admitting: Adult Health

## 2018-10-24 DIAGNOSIS — G459 Transient cerebral ischemic attack, unspecified: Secondary | ICD-10-CM | POA: Diagnosis not present

## 2018-10-24 DIAGNOSIS — I10 Essential (primary) hypertension: Secondary | ICD-10-CM | POA: Diagnosis not present

## 2018-10-24 DIAGNOSIS — E7800 Pure hypercholesterolemia, unspecified: Secondary | ICD-10-CM | POA: Diagnosis not present

## 2018-11-11 ENCOUNTER — Other Ambulatory Visit: Payer: Self-pay

## 2018-11-11 ENCOUNTER — Ambulatory Visit
Admission: RE | Admit: 2018-11-11 | Discharge: 2018-11-11 | Disposition: A | Discharge status: Home / Self Care | Payer: Medicare HMO | Source: Ambulatory Visit | Attending: Family Medicine | Admitting: Family Medicine

## 2018-11-11 DIAGNOSIS — Z1231 Encounter for screening mammogram for malignant neoplasm of breast: Secondary | ICD-10-CM | POA: Diagnosis not present

## 2018-11-26 ENCOUNTER — Encounter: Payer: Self-pay | Admitting: Adult Health

## 2018-11-26 ENCOUNTER — Ambulatory Visit (INDEPENDENT_AMBULATORY_CARE_PROVIDER_SITE_OTHER): Payer: Medicare HMO | Admitting: Adult Health

## 2018-11-26 ENCOUNTER — Other Ambulatory Visit: Payer: Self-pay

## 2018-11-26 VITALS — BP 136/78 | HR 84 | Temp 97.1°F | Ht 68.0 in | Wt 175.2 lb

## 2018-11-26 DIAGNOSIS — I10 Essential (primary) hypertension: Secondary | ICD-10-CM | POA: Diagnosis not present

## 2018-11-26 DIAGNOSIS — I63432 Cerebral infarction due to embolism of left posterior cerebral artery: Secondary | ICD-10-CM

## 2018-11-26 DIAGNOSIS — E785 Hyperlipidemia, unspecified: Secondary | ICD-10-CM

## 2018-11-26 DIAGNOSIS — Z720 Tobacco use: Secondary | ICD-10-CM | POA: Diagnosis not present

## 2018-11-26 DIAGNOSIS — N951 Menopausal and female climacteric states: Secondary | ICD-10-CM

## 2018-11-26 MED ORDER — ROSUVASTATIN CALCIUM 5 MG PO TABS: 5.0000 mg | ORAL_TABLET | Freq: Every day | ORAL | 6 refills | Status: DC

## 2018-11-26 NOTE — Patient Instructions (Signed)
Continue clopidogrel 75 mg daily  for secondary stroke prevention  Due to Lipitor side effects, discontinue and start rosuvastatin (Crestor) 5 mg daily -follow-up with your PCP to repeat lab work at next visit  Continue to follow up with PCP regarding cholesterol and blood pressure management   Continue to monitor blood pressure at home  Maintain strict control of hypertension with blood pressure goal below 130/90, diabetes with hemoglobin A1c goal below 6.5% and cholesterol with LDL cholesterol (bad cholesterol) goal below 70 mg/dL. I also advised the patient to eat a healthy diet with plenty of whole grains, cereals, fruits and vegetables, exercise regularly and maintain ideal body weight.         Thank you for coming to see [place] at [facility]. I hope we have been able to provide you high quality care today.  You may receive a patient satisfaction survey over the next few weeks. We would appreciate your feedback and comments so that we may continue to improve ourselves and the health of our patients.

## 2018-11-26 NOTE — Progress Notes (Signed)
[facility]  PATIENT: Patricia [name] DOB: 1950-04-07  REFERRING CLINICIAN: Kenton Kingfisher  HISTORY FROM: patient  REASON FOR VISIT: Stroke follow-up    HPI  Update 11/26/2018: Patricia [name] is a 68 year old female who is being seen today for stroke follow-up.  She has been doing well from a stroke standpoint without new or recurring stroke/TIA symptoms.  Discussion at prior visit regarding potential atorvastatin side effects causing myalgias and PCP recommended taking every other day.  She had difficulty obtaining refills therefore was off atorvastatin for 3 weeks and had complete resolution of symptoms.  She recently restarted atorvastatin and myalgias returned.  Discussion at prior visit regarding discontinuing hormonal treatment for menopausal symptoms and since that time, she has had worsening hot flashes.  She continues to smoke approximately 8 cigarettes/day and is aware of risks of continuing to smoke but has no interest in cessation at this time.  She continues on Plavix without bleeding or bruising.  Blood pressure today satisfactory 136/78.  Complaints of symptomatic elevated heart rate at prior visit therefore recommended 30-day cardiac event monitor to assess for potential atrial fibrillation.  Cardiac monitor completed which showed episodes of SVT.  She had follow-up with cardiology who recommended potentially having loop recorder placed but she declined at this time.  She has not had any additional episodes.  No further concerns at this time.  Virtual visit 04/21/2018: She was initially evaluated in this office by Dr. Leta Baptist on 10/18/2017 with office visit notes below.  She underwent echocardiogram and MRA which were unremarkable.  She has been stable since this time without any reoccurring or residual symptoms.  It was recommended DAPT for 3 months which was completed and continues on Plavix alone without side effects of bleeding or bruising.  She experienced statin  myalgia on atorvastatin 20 mg daily and therefore recommended taking 20 mg every other day by her PCP.  She denies ongoing symptoms since reduction of dose.  She did have lab work obtained this month by PCP and per patient report, satisfactory (unable to see lab work as PCP at [place]).  She was evaluated by Dr. Rexene Alberts on 12/09/2017 for OSA evaluation and recommended sleep study but patient declined further evaluation at that time.  Blood pressure stable.  She continues tobacco use smoking approximately 8 cigarettes/day but continues to try to decrease use with assistance of Nicorette gum.  She does report episode on 03/16/18 when she started to feel lightheaded with SOB and BP reading 97/77 and heart rate 184.  She continue to monitor throughout the day and normalized by the next day.  She denies feeling this sensation prior and has not experienced this since.  She denied any other associated symptom during this time.  Per review of her medication list, she is currently on norethindrone-ethinyl estradiol for management of hot flashes and night sweats and endorses being on this for "many years".  She has attempted to stop in the past but restarted due to increase in symptoms.  No further concerns at this time.      INITIAL VISIT 10/18/17 Copied from Dr. Gladstone Lighter office visit for reference purposes only   68 year old female here for evaluation of stroke.  Patient has history of tobacco abuse, hypertension, insomnia, snoring.  Last Wednesday patient had 1 to 2-minute episode of right tongue, right face and right arm numbness.  On Thursday she had 2 more similar events each lasting 10 minutes.  On Friday she had right face arm and leg numbness  and weakness lasting for 40 minutes.  On Saturday patient had another episode lasting for 5 minutes.  On Sunday he had another episode lasting for 5 minutes.  She spoke with her daughter who recommended she take an aspirin.  On Monday she went to PCP for evaluation and  MRI of the brain was ordered.  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  Patient was started on aspirin and Plavix and referred here for further evaluation.  Patient denies any chest pain, shortness of breath, palpitations or history of atrial fibrillation.   REVIEW OF SYSTEMS: Full 14 system review of systems performed and negative with exception of: Seasonal allergies, hot flashes and muscle pain   ALLERGIES: Allergies  Allergen Reactions  . Augmentin [Amoxicillin-Pot Clavulanate]     GI upset  . Levaquin [Levofloxacin In D5w]     Joint problems   . Mom [Magnesium Hydroxide]     Welts   . Oxycodone     Nausea     HOME MEDICATIONS: Outpatient Medications Prior to Visit  Medication Sig Dispense Refill  . carboxymethylcellulose (REFRESH PLUS) 0.5 % SOLN 1 drop 3 (three) times daily as needed.    . carisoprodol (SOMA) 350 MG tablet TK 1 T PO TID.  5  . clonazePAM (KLONOPIN) 1 MG tablet TK 1 T PO QD HS  3  . clopidogrel (PLAVIX) 75 MG tablet     . fluticasone (FLONASE) 50 MCG/ACT nasal spray U 2 SPRAYS IEN ONCE D  5  . gabapentin (NEURONTIN) 600 MG tablet Take 600 mg by mouth 2 (two) times daily.     Marland Kitchen LINZESS 290 MCG CAPS capsule TK ONE C PO ONCE A DAY WITH FIRST MEAL  6  . losartan (COZAAR) 100 MG tablet     . omeprazole (PRILOSEC) 20 MG capsule     . polyethylene glycol (MIRALAX / GLYCOLAX) packet Take 17 g by mouth daily.    . traMADol (ULTRAM) 50 MG tablet TK 1 T PO TID PRF PAIN  4  . VENTOLIN HFA 108 (90 Base) MCG/ACT inhaler INL 2 PFS PO Q 6 H PRN  5  . Vitamin D, Ergocalciferol, 2000 units CAPS Take by mouth.    . zaleplon (SONATA) 10 MG capsule Take 10 mg by mouth at bedtime as needed for sleep.    Marland Kitchen atorvastatin (LIPITOR) 20 MG tablet Take 1 tablet (20 mg total) by mouth daily. (Patient not taking: Reported on 11/26/2018) 30 tablet 6  . ipratropium (ATROVENT) 0.06 % nasal spray     . norethindrone-ethinyl  estradiol (FEMHRT 1/5) 1-5 MG-MCG TABS tablet TK 1 T PO QD  6   No facility-administered medications prior to visit.     PAST MEDICAL HISTORY: Past Medical History:  Diagnosis Date  . Anxiety   . GERD (gastroesophageal reflux disease)   . Hyperlipidemia   . Hypertension   . IBS (irritable bowel syndrome)   . Insomnia   . Low back pain   . TIA (transient ischemic attack)   . Vitamin B12 deficiency   . Vitamin D deficiency     PAST SURGICAL HISTORY: Past Surgical History:  Procedure Laterality Date  . BACK SURGERY    . BREAST EXCISIONAL BIOPSY Left 1997   benign  . hemorrhoidecotmy    . HERNIA MESH REMOVAL    . ORIF RADIUS & ULNA FRACTURES      FAMILY HISTORY: Family History  Problem Relation Age of Onset  .  Breast cancer Sister   . Breast cancer Maternal Aunt   . Breast cancer Paternal Aunt     SOCIAL HISTORY: Social History   Socioeconomic History  . Marital status: Married    Spouse name: Not on file  . Number of children: Not on file  . Years of education: Not on file  . Highest education level: Not on file  Occupational History  . Not on file  Social Needs  . Financial resource strain: Not on file  . Food insecurity    Worry: Not on file    Inability: Not on file  . Transportation needs    Medical: Not on file    Non-medical: Not on file  Tobacco Use  . Smoking status: Current Every Day Smoker    Packs/day: 1.00  . Smokeless tobacco: Never Used  Substance and Sexual Activity  . Alcohol use: Yes    Comment: occ once every 2 months  . Drug use: Never  . Sexual activity: Not on file  Lifestyle  . Physical activity    Days per week: Not on file    Minutes per session: Not on file  . Stress: Not on file  Relationships  . Social Herbalist on phone: Not on file    Gets together: Not on file    Attends religious service: Not on file    Active member of club or organization: Not on file    Attends meetings of clubs or organizations:  Not on file    Relationship status: Not on file  . Intimate partner violence    Fear of current or ex partner: Not on file    Emotionally abused: Not on file    Physically abused: Not on file    Forced sexual activity: Not on file  Other Topics Concern  . Not on file  Social History Narrative   Live with husband.  Education HS.  Retired.  Children 2 (daughter). 1 cup coffee.       PHYSICAL EXAM  Today's Vitals   11/26/18 1110  BP: 136/78  Pulse: 84  Temp: (!) 97.1 F (36.2 C)  TempSrc: Oral  Weight: 175 lb 3.2 oz (79.5 kg)  Height: '5\' 8"'  (1.727 m)   Body mass index is 26.64 kg/m.  General: well developed, well nourished,  pleasant middle-age Caucasian female, seated, in no evident distress Head: head normocephalic and atraumatic.   Neck: supple with no carotid or supraclavicular bruits Cardiovascular: regular rate and rhythm, no murmurs Musculoskeletal: no deformity Skin:  no rash/petichiae Vascular:  Normal pulses all extremities   Neurologic Exam Mental Status: Awake and fully alert. Oriented to place and time. Recent and remote memory intact. Attention span, concentration and fund of knowledge appropriate. Mood and affect appropriate.  Cranial Nerves: Pupils equal, briskly reactive to light. Extraocular movements full without nystagmus. Visual fields full to confrontation. Hearing intact. Facial sensation intact. Face, tongue, palate moves normally and symmetrically.  Motor: Normal bulk and tone. Normal strength in all tested extremity muscles. Sensory.: intact to touch , pinprick , position and vibratory sensation.  Coordination: Rapid alternating movements normal in all extremities. Finger-to-nose and heel-to-shin performed accurately bilaterally. Gait and Station: Arises from chair without difficulty. Stance is normal. Gait demonstrates normal stride length and balance Reflexes: 1+ and symmetric. Toes downgoing.       DIAGNOSTIC DATA (LABS, IMAGING, TESTING) -  I reviewed patient records, labs, notes, testing and imaging myself where available.  MR MRA NECK  W WO CONTRAST 10/23/17 IMPRESSION:  Normal MRA neck (with and without).  ECHOCARDIOGRAM 10/25/17 Study Conclusions - Left ventricle: The cavity size was normal. Systolic function was   normal. The estimated ejection fraction was in the range of 55%   to 60%. Wall motion was normal; there were no regional wall   motion abnormalities. Doppler parameters are consistent with   abnormal left ventricular relaxation (grade 1 diastolic   dysfunction). There was no evidence of elevated ventricular   filling pressure by Doppler parameters. - Aortic valve: There was no regurgitation. - Mitral valve: There was trivial regurgitation. - Right ventricle: The cavity size was normal. Wall thickness was   normal. Systolic function was normal. - Tricuspid valve: There was mild regurgitation. - Pulmonary arteries: Systolic pressure was within the normal   range. - Inferior vena cava: The vessel was normal in size. - Pericardium, extracardiac: There was no pericardial effusion.      Lab Results  Component Value Date   WBC 7.0 02/22/2009   HGB 15.4 (H) 02/22/2009   HCT 44.0 02/22/2009   MCV 105.0 (H) 02/22/2009   PLT 274 02/22/2009      Component Value Date/Time   NA 128 (L) 02/22/2009 1429   K 4.3 02/22/2009 1429   CL 92 (L) 02/22/2009 1429   CO2 27 02/22/2009 1429   GLUCOSE 105 (H) 02/22/2009 1429   BUN 5 (L) 02/22/2009 1429   CREATININE 0.65 02/22/2009 1429   CALCIUM 9.5 02/22/2009 1429   PROT 6.9 02/22/2009 1429   ALBUMIN 4.5 02/22/2009 1429   AST 16 02/22/2009 1429   ALT 16 02/22/2009 1429   ALKPHOS 55 02/22/2009 1429   BILITOT 0.4 02/22/2009 1429   GFRNONAA >60 02/22/2009 1429   GFRAA  02/22/2009 1429    >60        The eGFR has been calculated using the MDRD equation. This calculation has not been validated in all clinical situations. eGFR's persistently <60 mL/min signify  possible Chronic Kidney Disease.   10/16/17 MRI brain / MRA head  [I reviewed images myself and agree with interpretation. -VRP]  1. Cluster of small acute infarcts in the left occipital cortex. 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 2. Advanced right P3 branch narrowing. 3. No flow limiting stenosis or infarct in the anterior circulation.      ASSESSMENT AND PLAN  68 y.o. year old female here with intermittent episodes of right face, arm, leg numbness and weakness, sometimes associated with visual disturbance, here for evaluation of stroke.  On neuroanatomic basis, TIAs could have been triggered by left thalamic ischemia, arising from left PCA stenosis.  Patient had some visual disturbance as well and embolic phenomenon to the left occipital lobe could be responsible.  Has been stable since prior visit without residual deficits, recurring or new stroke/TIA symptoms.   1. STROKE : Continue clopidogrel 75 mg daily  for secondary stroke prevention.  Due to atorvastatin intolerance, recommend discontinuing and initiating Crestor 5 mg daily.  Maintain strict control of hypertension with blood pressure goal below 130/90, diabetes with hemoglobin A1c goal below 6.5% and cholesterol with LDL cholesterol (bad cholesterol) goal below 70 mg/dL.  I also advised the patient to eat a healthy diet with plenty of whole grains, cereals, fruits and vegetables, exercise regularly with at least 30 minutes of continuous activity daily and maintain ideal body weight. 2. HTN: Advised to continue current treatment regimen.  Advised to continue to monitor at home along with  continued follow-up with PCP for management 3. HLD: Atorvastatin intolerance and recommended discontinuing and initiating Crestor 5 mg daily.  Advised her to follow-up with her PCP for repeat lab work at her next follow-up visit 4. Tobacco use: Highly encouraged smoking cessation 5. Hot Flashes: Provided patient with education regarding  nonhormonal treatment options and to discuss further with PCP but to avoid restarting any type of hormonal treatment options due to risk of increased stroke   Overall stable from stroke standpoint recommend follow-up as needed  Greater than 50% of time during this 25 minute visit was spent on counseling,explanation of diagnosis of stroke, reviewing risk factor management of HTN, HLD, and tobacco use,  planning of further management, discussion with patient and family and coordination of care   Venancio Poisson, AGNP-BC  [facility] [address] [place], [place] [address]  Phone [phone] Fax [phone] Note: This document was prepared with digital dictation and possible smart phrase technology. Any transcriptional errors that result from this process are unintentional.

## 2018-11-28 NOTE — Progress Notes (Signed)
I agree with the above plan 

## 2018-12-15 DIAGNOSIS — H04123 Dry eye syndrome of bilateral lacrimal glands: Secondary | ICD-10-CM | POA: Diagnosis not present

## 2018-12-15 DIAGNOSIS — H35033 Hypertensive retinopathy, bilateral: Secondary | ICD-10-CM | POA: Diagnosis not present

## 2018-12-15 DIAGNOSIS — H353131 Nonexudative age-related macular degeneration, bilateral, early dry stage: Secondary | ICD-10-CM | POA: Diagnosis not present

## 2018-12-15 DIAGNOSIS — H2513 Age-related nuclear cataract, bilateral: Secondary | ICD-10-CM | POA: Diagnosis not present

## 2019-03-10 DIAGNOSIS — G4700 Insomnia, unspecified: Secondary | ICD-10-CM | POA: Diagnosis not present

## 2019-03-10 DIAGNOSIS — E7800 Pure hypercholesterolemia, unspecified: Secondary | ICD-10-CM | POA: Diagnosis not present

## 2019-03-10 DIAGNOSIS — I10 Essential (primary) hypertension: Secondary | ICD-10-CM | POA: Diagnosis not present

## 2019-03-10 DIAGNOSIS — G459 Transient cerebral ischemic attack, unspecified: Secondary | ICD-10-CM | POA: Diagnosis not present

## 2019-04-01 DIAGNOSIS — B078 Other viral warts: Secondary | ICD-10-CM | POA: Diagnosis not present

## 2019-04-08 DIAGNOSIS — E559 Vitamin D deficiency, unspecified: Secondary | ICD-10-CM | POA: Diagnosis not present

## 2019-04-08 DIAGNOSIS — E7800 Pure hypercholesterolemia, unspecified: Secondary | ICD-10-CM | POA: Diagnosis not present

## 2019-04-08 DIAGNOSIS — Z0000 Encounter for general adult medical examination without abnormal findings: Secondary | ICD-10-CM | POA: Diagnosis not present

## 2019-04-08 DIAGNOSIS — F411 Generalized anxiety disorder: Secondary | ICD-10-CM | POA: Diagnosis not present

## 2019-04-08 DIAGNOSIS — N760 Acute vaginitis: Secondary | ICD-10-CM | POA: Diagnosis not present

## 2019-04-08 DIAGNOSIS — I10 Essential (primary) hypertension: Secondary | ICD-10-CM | POA: Diagnosis not present

## 2019-04-08 DIAGNOSIS — G4700 Insomnia, unspecified: Secondary | ICD-10-CM | POA: Diagnosis not present

## 2019-04-08 DIAGNOSIS — E538 Deficiency of other specified B group vitamins: Secondary | ICD-10-CM | POA: Diagnosis not present

## 2019-04-08 DIAGNOSIS — K5901 Slow transit constipation: Secondary | ICD-10-CM | POA: Diagnosis not present

## 2019-04-29 DIAGNOSIS — B078 Other viral warts: Secondary | ICD-10-CM | POA: Diagnosis not present

## 2019-05-18 DIAGNOSIS — I10 Essential (primary) hypertension: Secondary | ICD-10-CM | POA: Diagnosis not present

## 2019-05-18 DIAGNOSIS — G4700 Insomnia, unspecified: Secondary | ICD-10-CM | POA: Diagnosis not present

## 2019-05-18 DIAGNOSIS — E7800 Pure hypercholesterolemia, unspecified: Secondary | ICD-10-CM | POA: Diagnosis not present

## 2019-05-18 DIAGNOSIS — G459 Transient cerebral ischemic attack, unspecified: Secondary | ICD-10-CM | POA: Diagnosis not present

## 2019-06-23 ENCOUNTER — Other Ambulatory Visit: Payer: Self-pay | Admitting: Adult Health

## 2019-06-25 DIAGNOSIS — Z8673 Personal history of transient ischemic attack (TIA), and cerebral infarction without residual deficits: Secondary | ICD-10-CM | POA: Diagnosis not present

## 2019-06-25 DIAGNOSIS — N9089 Other specified noninflammatory disorders of vulva and perineum: Secondary | ICD-10-CM | POA: Diagnosis not present

## 2019-06-25 DIAGNOSIS — N951 Menopausal and female climacteric states: Secondary | ICD-10-CM | POA: Diagnosis not present

## 2019-06-25 DIAGNOSIS — N952 Postmenopausal atrophic vaginitis: Secondary | ICD-10-CM | POA: Diagnosis not present

## 2019-06-29 DIAGNOSIS — I10 Essential (primary) hypertension: Secondary | ICD-10-CM | POA: Diagnosis not present

## 2019-06-29 DIAGNOSIS — E7800 Pure hypercholesterolemia, unspecified: Secondary | ICD-10-CM | POA: Diagnosis not present

## 2019-06-29 DIAGNOSIS — G4700 Insomnia, unspecified: Secondary | ICD-10-CM | POA: Diagnosis not present

## 2019-06-29 DIAGNOSIS — G459 Transient cerebral ischemic attack, unspecified: Secondary | ICD-10-CM | POA: Diagnosis not present

## 2019-07-01 DIAGNOSIS — B078 Other viral warts: Secondary | ICD-10-CM | POA: Diagnosis not present

## 2019-07-22 ENCOUNTER — Other Ambulatory Visit: Payer: Self-pay | Admitting: Adult Health

## 2019-08-05 DIAGNOSIS — G459 Transient cerebral ischemic attack, unspecified: Secondary | ICD-10-CM | POA: Diagnosis not present

## 2019-08-05 DIAGNOSIS — E7800 Pure hypercholesterolemia, unspecified: Secondary | ICD-10-CM | POA: Diagnosis not present

## 2019-08-05 DIAGNOSIS — I10 Essential (primary) hypertension: Secondary | ICD-10-CM | POA: Diagnosis not present

## 2019-08-05 DIAGNOSIS — G4700 Insomnia, unspecified: Secondary | ICD-10-CM | POA: Diagnosis not present

## 2019-09-29 DIAGNOSIS — Z8673 Personal history of transient ischemic attack (TIA), and cerebral infarction without residual deficits: Secondary | ICD-10-CM | POA: Diagnosis not present

## 2019-09-29 DIAGNOSIS — N952 Postmenopausal atrophic vaginitis: Secondary | ICD-10-CM | POA: Diagnosis not present

## 2019-09-29 DIAGNOSIS — L309 Dermatitis, unspecified: Secondary | ICD-10-CM | POA: Diagnosis not present

## 2019-10-02 ENCOUNTER — Other Ambulatory Visit: Payer: Self-pay | Admitting: Family Medicine

## 2019-10-02 DIAGNOSIS — Z1231 Encounter for screening mammogram for malignant neoplasm of breast: Secondary | ICD-10-CM

## 2019-10-08 DIAGNOSIS — M545 Low back pain: Secondary | ICD-10-CM | POA: Diagnosis not present

## 2019-10-08 DIAGNOSIS — K219 Gastro-esophageal reflux disease without esophagitis: Secondary | ICD-10-CM | POA: Diagnosis not present

## 2019-10-08 DIAGNOSIS — Z23 Encounter for immunization: Secondary | ICD-10-CM | POA: Diagnosis not present

## 2019-10-08 DIAGNOSIS — I10 Essential (primary) hypertension: Secondary | ICD-10-CM | POA: Diagnosis not present

## 2019-10-08 DIAGNOSIS — E7800 Pure hypercholesterolemia, unspecified: Secondary | ICD-10-CM | POA: Diagnosis not present

## 2019-10-15 DIAGNOSIS — M7062 Trochanteric bursitis, left hip: Secondary | ICD-10-CM | POA: Diagnosis not present

## 2019-10-15 DIAGNOSIS — M7061 Trochanteric bursitis, right hip: Secondary | ICD-10-CM | POA: Diagnosis not present

## 2019-10-15 DIAGNOSIS — M25561 Pain in right knee: Secondary | ICD-10-CM | POA: Diagnosis not present

## 2019-10-23 DIAGNOSIS — M25561 Pain in right knee: Secondary | ICD-10-CM | POA: Diagnosis not present

## 2019-11-02 DIAGNOSIS — M25561 Pain in right knee: Secondary | ICD-10-CM | POA: Diagnosis not present

## 2019-11-12 ENCOUNTER — Other Ambulatory Visit: Payer: Self-pay

## 2019-11-12 ENCOUNTER — Ambulatory Visit
Admission: RE | Admit: 2019-11-12 | Discharge: 2019-11-12 | Disposition: A | Discharge status: Home / Self Care | Payer: Medicare HMO | Source: Ambulatory Visit | Attending: Family Medicine | Admitting: Family Medicine

## 2019-11-12 DIAGNOSIS — Z1231 Encounter for screening mammogram for malignant neoplasm of breast: Secondary | ICD-10-CM | POA: Diagnosis not present

## 2019-11-17 ENCOUNTER — Other Ambulatory Visit: Payer: Self-pay

## 2019-11-17 ENCOUNTER — Encounter (HOSPITAL_COMMUNITY): Payer: Self-pay

## 2019-11-17 ENCOUNTER — Emergency Department (HOSPITAL_COMMUNITY)
Admission: EM | Admit: 2019-11-17 | Discharge: 2019-11-17 | Disposition: A | Discharge status: Home / Self Care | Payer: Medicare HMO | Attending: Emergency Medicine | Admitting: Emergency Medicine

## 2019-11-17 ENCOUNTER — Emergency Department (HOSPITAL_COMMUNITY): Payer: Medicare HMO

## 2019-11-17 DIAGNOSIS — Z20822 Contact with and (suspected) exposure to covid-19: Secondary | ICD-10-CM | POA: Insufficient documentation

## 2019-11-17 DIAGNOSIS — Z7901 Long term (current) use of anticoagulants: Secondary | ICD-10-CM | POA: Insufficient documentation

## 2019-11-17 DIAGNOSIS — S52601A Unspecified fracture of lower end of right ulna, initial encounter for closed fracture: Secondary | ICD-10-CM | POA: Diagnosis not present

## 2019-11-17 DIAGNOSIS — F17200 Nicotine dependence, unspecified, uncomplicated: Secondary | ICD-10-CM | POA: Diagnosis not present

## 2019-11-17 DIAGNOSIS — I10 Essential (primary) hypertension: Secondary | ICD-10-CM | POA: Diagnosis not present

## 2019-11-17 DIAGNOSIS — S6991XA Unspecified injury of right wrist, hand and finger(s), initial encounter: Secondary | ICD-10-CM | POA: Diagnosis present

## 2019-11-17 DIAGNOSIS — W08XXXA Fall from other furniture, initial encounter: Secondary | ICD-10-CM | POA: Insufficient documentation

## 2019-11-17 DIAGNOSIS — Z79899 Other long term (current) drug therapy: Secondary | ICD-10-CM | POA: Diagnosis not present

## 2019-11-17 DIAGNOSIS — R42 Dizziness and giddiness: Secondary | ICD-10-CM | POA: Diagnosis not present

## 2019-11-17 DIAGNOSIS — S52501A Unspecified fracture of the lower end of right radius, initial encounter for closed fracture: Secondary | ICD-10-CM

## 2019-11-17 DIAGNOSIS — S52291A Other fracture of shaft of right ulna, initial encounter for closed fracture: Secondary | ICD-10-CM | POA: Diagnosis not present

## 2019-11-17 DIAGNOSIS — I959 Hypotension, unspecified: Secondary | ICD-10-CM | POA: Diagnosis not present

## 2019-11-17 DIAGNOSIS — S52571A Other intraarticular fracture of lower end of right radius, initial encounter for closed fracture: Secondary | ICD-10-CM | POA: Diagnosis not present

## 2019-11-17 DIAGNOSIS — W19XXXA Unspecified fall, initial encounter: Secondary | ICD-10-CM | POA: Diagnosis not present

## 2019-11-17 DIAGNOSIS — R52 Pain, unspecified: Secondary | ICD-10-CM | POA: Diagnosis not present

## 2019-11-17 DIAGNOSIS — S52591A Other fractures of lower end of right radius, initial encounter for closed fracture: Secondary | ICD-10-CM | POA: Diagnosis not present

## 2019-11-17 DIAGNOSIS — Y9389 Activity, other specified: Secondary | ICD-10-CM | POA: Insufficient documentation

## 2019-11-17 DIAGNOSIS — R102 Pelvic and perineal pain: Secondary | ICD-10-CM | POA: Diagnosis not present

## 2019-11-17 DIAGNOSIS — R61 Generalized hyperhidrosis: Secondary | ICD-10-CM | POA: Diagnosis not present

## 2019-11-17 DIAGNOSIS — S52351A Displaced comminuted fracture of shaft of radius, right arm, initial encounter for closed fracture: Secondary | ICD-10-CM | POA: Diagnosis not present

## 2019-11-17 DIAGNOSIS — S52691A Other fracture of lower end of right ulna, initial encounter for closed fracture: Secondary | ICD-10-CM | POA: Diagnosis not present

## 2019-11-17 LAB — BASIC METABOLIC PANEL
Anion gap: 9 (ref 5–15)
BUN: 13 mg/dL (ref 8–23)
CO2: 26 mmol/L (ref 22–32)
Calcium: 8.5 mg/dL — ABNORMAL LOW (ref 8.9–10.3)
Chloride: 98 mmol/L (ref 98–111)
Creatinine, Ser: 0.91 mg/dL (ref 0.44–1.00)
GFR, Estimated: >60 mL/min (ref >60)
Glucose, Bld: 101 mg/dL — ABNORMAL HIGH (ref 70–99)
Potassium: 4 mmol/L (ref 3.5–5.1)
Sodium: 133 mmol/L — ABNORMAL LOW (ref 135–145)

## 2019-11-17 LAB — CBC WITH DIFFERENTIAL/PLATELET
Abs Immature Granulocytes: 0.03 10*3/uL (ref 0.00–0.07)
Basophils Absolute: 0 10*3/uL (ref 0.0–0.1)
Basophils Relative: 1 %
Eosinophils Absolute: 0.1 10*3/uL (ref 0.0–0.5)
Eosinophils Relative: 1 %
HCT: 42.6 % (ref 36.0–46.0)
Hemoglobin: 14.5 g/dL (ref 12.0–15.0)
Immature Granulocytes: 0 %
Lymphocytes Relative: 13 %
Lymphs Abs: 1.1 10*3/uL (ref 0.7–4.0)
MCH: 35 pg — ABNORMAL HIGH (ref 26.0–34.0)
MCHC: 34 g/dL (ref 30.0–36.0)
MCV: 102.9 fL — ABNORMAL HIGH (ref 80.0–100.0)
Monocytes Absolute: 0.5 10*3/uL (ref 0.1–1.0)
Monocytes Relative: 6 %
Neutro Abs: 6.7 10*3/uL (ref 1.7–7.7)
Neutrophils Relative %: 79 %
Platelets: 260 10*3/uL (ref 150–400)
RBC: 4.14 MIL/uL (ref 3.87–5.11)
RDW: 13.8 % (ref 11.5–15.5)
WBC: 8.5 10*3/uL (ref 4.0–10.5)
nRBC: 0 % (ref 0.0–0.2)

## 2019-11-17 LAB — RESPIRATORY PANEL BY RT PCR (FLU A&B, COVID)
Influenza A by PCR: NEGATIVE
Influenza B by PCR: NEGATIVE
SARS Coronavirus 2 by RT PCR: NEGATIVE

## 2019-11-17 MED ORDER — HYDROMORPHONE HCL 1 MG/ML IJ SOLN
1.0000 mg | Freq: Once | INTRAMUSCULAR | Status: AC
  Administered 2019-11-17: 1 mg via INTRAVENOUS
  Filled 2019-11-17: qty 1

## 2019-11-17 MED ORDER — OXYCODONE HCL 5 MG PO TABS: 5.0000 mg | ORAL_TABLET | ORAL | 0 refills | Status: DC | PRN

## 2019-11-17 MED ORDER — ONDANSETRON HCL 4 MG PO TABS: 4.0000 mg | ORAL_TABLET | Freq: Three times a day (TID) | ORAL | 1 refills | Status: AC | PRN

## 2019-11-17 MED ORDER — OXYCODONE-ACETAMINOPHEN 5-325 MG PO TABS
2.0000 | ORAL_TABLET | Freq: Once | ORAL | Status: AC
  Administered 2019-11-17: 2 via ORAL
  Filled 2019-11-17: qty 2

## 2019-11-17 MED ORDER — ONDANSETRON HCL 4 MG/2ML IJ SOLN
4.0000 mg | Freq: Once | INTRAMUSCULAR | Status: AC
  Administered 2019-11-17: 4 mg via INTRAVENOUS
  Filled 2019-11-17: qty 2

## 2019-11-17 NOTE — ED Notes (Signed)
Rounded on pt, pt reports being comfortable - pain tolerable on right wrist area. Pt already on short arm splint upon rounding. Family remains at bedside. Call bell within reach.

## 2019-11-17 NOTE — Consult Note (Signed)
Reason for Consult: Patient presents with right radius and ulna fractures after injury process. Referring Physician: ER staff  Patricia [name] is an 69 y.o. female.  HPI: Pleasant 69 year old female with history of left wrist fracture who presents with a right wrist fracture about the radius and ulna.  I have reviewed her examination at length and the findings.   This appears to be same level fall she is stable awake alert and oriented.  She has no signs of instability infection or dystrophy.  She is here with her husband of course.  Lower extremity examination appears to be benign there is no evidence of instability about the left upper extremity.  She has nonlabored breathing.  I reviewed her examination at length. Past Medical History:  Diagnosis Date  . Anxiety   . GERD (gastroesophageal reflux disease)   . Hyperlipidemia   . Hypertension   . IBS (irritable bowel syndrome)   . Insomnia   . Low back pain   . TIA (transient ischemic attack)   . Vitamin B12 deficiency   . Vitamin D deficiency     Past Surgical History:  Procedure Laterality Date  . BACK SURGERY    . BREAST EXCISIONAL BIOPSY Left 1997   benign  . hemorrhoidecotmy    . HERNIA MESH REMOVAL    . ORIF RADIUS & ULNA FRACTURES      Family History  Problem Relation Age of Onset  . Breast cancer Sister   . Breast cancer Maternal Aunt   . Breast cancer Paternal Aunt     Social History:  reports that she has been smoking. She has been smoking about 1.00 pack per day. She has never used smokeless tobacco. She reports current alcohol use. She reports that she does not use drugs.  Allergies:  Allergies  Allergen Reactions  . Augmentin [Amoxicillin-Pot Clavulanate] Nausea Only and Other (Patricia Comments)    GI upset  . Levaquin [Levofloxacin In D5w] Other (Patricia Comments)    Joint problems   . Mom [Magnesium Hydroxide] Other (Patricia Comments)    Welts   . Oxycodone Other (Patricia Comments)    Nausea      Medications: I have reviewed the patient's current medications.  Results for orders placed or performed during the hospital encounter of 11/17/19 (from the past 48 hour(s))  CBC with Differential/Platelet     Status: Abnormal   Collection Time: 11/17/19  5:01 PM  Result Value Ref Range   WBC 8.5 4.0 - 10.5 K/uL   RBC 4.14 3.87 - 5.11 MIL/uL   Hemoglobin 14.5 12.0 - 15.0 g/dL   HCT 42.6 36 - 46 %   MCV 102.9 (H) 80.0 - 100.0 fL   MCH 35.0 (H) 26.0 - 34.0 pg   MCHC 34.0 30.0 - 36.0 g/dL   RDW 13.8 11.5 - 15.5 %   Platelets 260 150 - 400 K/uL   nRBC 0.0 0.0 - 0.2 %   Neutrophils Relative % 79 %   Neutro Abs 6.7 1.7 - 7.7 K/uL   Lymphocytes Relative 13 %   Lymphs Abs 1.1 0.7 - 4.0 K/uL   Monocytes Relative 6 %   Monocytes Absolute 0.5 0.1 - 1.0 K/uL   Eosinophils Relative 1 %   Eosinophils Absolute 0.1 0.0 - 0.5 K/uL   Basophils Relative 1 %   Basophils Absolute 0.0 0.0 - 0.1 K/uL   Immature Granulocytes 0 %   Abs Immature Granulocytes 0.03 0.00 - 0.07 K/uL    Comment: Performed  at [facility], [place] [address] [place], [place] [address]  Basic metabolic panel     Status: Abnormal   Collection Time: 11/17/19  5:01 PM  Result Value Ref Range   Sodium 133 (L) 135 - 145 mmol/L   Potassium 4.0 3.5 - 5.1 mmol/L   Chloride 98 98 - 111 mmol/L   CO2 26 22 - 32 mmol/L   Glucose, Bld 101 (H) 70 - 99 mg/dL    Comment: Glucose reference range applies only to samples taken after fasting for at least 8 hours.   BUN 13 8 - 23 mg/dL   Creatinine, Ser 0.91 0.44 - 1.00 mg/dL   Calcium 8.5 (L) 8.9 - 10.3 mg/dL   GFR, Estimated >60 >60 mL/min    Comment: (NOTE) Calculated using the CKD-EPI Creatinine Equation (2021)    Anion gap 9 5 - 15    Comment: Performed at [facility] [address], [place], [place] [address]  Respiratory Panel by RT PCR (Flu A&B, Covid) - Nasopharyngeal Swab     Status: None   Collection Time: 11/17/19  5:01 PM   Specimen: Nasopharyngeal Swab   Result Value Ref Range   SARS Coronavirus 2 by RT PCR NEGATIVE NEGATIVE    Comment: (NOTE) SARS-CoV-2 target nucleic acids are NOT DETECTED.  The SARS-CoV-2 RNA is generally detectable in upper respiratoy specimens during the acute phase of infection. The lowest concentration of SARS-CoV-2 viral copies this assay can detect is 131 copies/mL. A negative result does not preclude SARS-Cov-2 infection and should not be used as the sole basis for treatment or other patient management decisions. A negative result may occur with  improper specimen collection/handling, submission of specimen other than nasopharyngeal swab, presence of viral mutation(s) within the areas targeted by this assay, and inadequate number of viral copies (<131 copies/mL). A negative result must be combined with clinical observations, patient history, and epidemiological information. The expected result is Negative.  Fact Sheet for Patients:  PinkCheek.be  Fact Sheet for Healthcare Providers:  GravelBags.it  This test is no t yet approved or cleared by the [place] FDA and  has been authorized for detection and/or diagnosis of SARS-CoV-2 by FDA under an Emergency Use Authorization (EUA). This EUA will remain  in effect (meaning this test can be used) for the duration of the COVID-19 declaration under Section 564(b)(1) of the Act, 21 U.S.C. section 360bbb-3(b)(1), unless the authorization is terminated or revoked sooner.     Influenza A by PCR NEGATIVE NEGATIVE   Influenza B by PCR NEGATIVE NEGATIVE    Comment: (NOTE) The Xpert Xpress SARS-CoV-2/FLU/RSV assay is intended as an aid in  the diagnosis of influenza from Nasopharyngeal swab specimens and  should not be used as a sole basis for treatment. Nasal washings and  aspirates are unacceptable for Xpert Xpress SARS-CoV-2/FLU/RSV  testing.  Fact Sheet for  Patients: PinkCheek.be  Fact Sheet for Healthcare Providers: GravelBags.it  This test is not yet approved or cleared by the [place] FDA and  has been authorized for detection and/or diagnosis of SARS-CoV-2 by  FDA under an Emergency Use Authorization (EUA). This EUA will remain  in effect (meaning this test can be used) for the duration of the  Covid-19 declaration under Section 564(b)(1) of the Act, 21  U.S.C. section 360bbb-3(b)(1), unless the authorization is  terminated or revoked. Performed at [facility], [place] [address] [place], [place] [address]     DG Pelvis 1-2  Views  Result Date: 11/17/2019 CLINICAL DATA:  Pain following fall EXAM: PELVIS - 1-2 VIEW COMPARISON:  October 15, 2019 FINDINGS: There is no evidence of pelvic fracture or dislocation. Joint spaces appear unremarkable. No erosive change. There are surgical clips in the lower left pelvis. IMPRESSION: No appreciable fracture or dislocation. No appreciable joint space narrowing or erosion. Electronically Signed   By: Lowella Grip [name] M.D.   On: 11/17/2019 17:40   DG Wrist Complete Right  Result Date: 11/17/2019 CLINICAL DATA:  Pain following fall EXAM: RIGHT WRIST - COMPLETE 3+ VIEW COMPARISON:  None. FINDINGS: Frontal, oblique, and lateral views were obtained. There is a comminuted fracture of the distal radial metaphysis with lateral displacement dorsal angulation distally and impaction at fracture site. There is an obliquely oriented fracture of the distal diaphysis of the ulna with lateral displacement and angulation distally. There is avulsion of the ulnar styloid. No other fractures are evident. No dislocation. 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 IMPRESSION: Comminuted fracture distal radial metaphysis with lateral displacement and dorsal angulation distally. There is a degree of impaction at  the fracture site. There is a fracture of the distal ulnar diaphysis with lateral displacement and angulation distally. Avulsion ulnar styloid. No dislocation. 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 Electronically Signed   By: Lowella Grip [name] M.D.   On: 11/17/2019 17:39    Review of Systems  Respiratory: Negative.   Cardiovascular: Negative.   Gastrointestinal: Negative.   Endocrine: Negative.    Blood pressure 130/74, pulse 69, temperature 98.5 F (36.9 C), temperature source Oral, resp. rate 18, height 5\' 7"  (1.702 m), weight 78 kg, SpO2 98 %. Physical Exam very pleasant female comminuted complex right distal radius and ulna fracture.  No evidence of shoulder or left upper extremity injury lower extreme examination is benign.  She has nonlabored breathing abdomen is nontender.  The patient is alert and oriented in no acute distress. The patient complains of pain in the affected upper extremity.  The patient is noted to have a normal HEENT exam. Lung fields show equal chest expansion and no shortness of breath. Abdomen exam is nontender without distention. Lower extremity examination does not show any fracture dislocation or blood clot symptoms. Pelvis is stable and the neck and back are stable and nontender.  Assessment/Plan: Comminuted complex right distal radius and ulna fracture.  She underwent provisional stabilization by the emergency room department.  I examined her after this.  She is in a sugar tong splint she is sensate has good refill and no signs of compartment syndrome.  We will ask her to hold her Plavix and will get her booked for surgical intervention in the form of open reduction internal fixation.  She had a TIA years ago and has been on a statin and Plavix.  We feel comfortable taking her off the Plavix at this juncture and moving forward with surgical reconstruction.  I discussed these issues with her at  length.  We are planning surgery for your upper extremity. The risk and benefits of surgery to include risk of bleeding, infection, anesthesia,  damage to normal structures and failure of the surgery to accomplish its intended goals of relieving symptoms and restoring function have been discussed in detail. With this in mind we plan to proceed. I have specifically discussed with the patient the pre-and postoperative regime and the dos and don'ts and risk and benefits in great detail. Risk and benefits  of surgery also include risk of dystrophy(CRPS), chronic nerve pain, failure of the healing process to go onto completion and other inherent risks of surgery The relavent the pathophysiology of the disease/injury process, as well as the alternatives for treatment and postoperative course of action has been discussed in great detail with the patient who desires to proceed.  We will do everything in our power to help you (the patient) restore function to the upper extremity. It is a pleasure to Patricia this patient today. Given the timeframe duration from injury to now and the history of blood thinner we will go ahead and schedule this electively.  She understands this.  Patricia [name] 11/17/2019, 7:58 PM

## 2019-11-17 NOTE — ED Triage Notes (Signed)
Pt fell from step stool. Obvious deformity right wrist/arm. Denies any other complaints. Pt in obvious pain.

## 2019-11-17 NOTE — ED Notes (Signed)
Ortho tech aware of need for pt to be in a sling.

## 2019-11-17 NOTE — ED Notes (Signed)
Patient transported to Xray via stretcher in stable condition. 

## 2019-11-17 NOTE — ED Provider Notes (Signed)
[facility] EMERGENCY DEPARTMENT Provider Note   CSN: [id number] Arrival date & time: 11/17/19  1612     History Chief Complaint  Patient presents with  . Fall    right arm deformity    Patricia [name] is a 69 y.o. female hx of IBS, previous left wrist fracture, here presenting with right wrist deformity.  Patient states that she was having some curtains and was coming down from a stepstool and missed the step and landed on the left hip and outstretched hand on the right.  Patient did not have any head injury.  Patient states that she did not pass out.  Patient has obvious right wrist deformity.  Patient states that she had ORIF of the left wrist previously by Dr. Amedeo Plenty and wants to request them again.   The history is provided by the patient.       Past Medical History:  Diagnosis Date  . Anxiety   . GERD (gastroesophageal reflux disease)   . Hyperlipidemia   . Hypertension   . IBS (irritable bowel syndrome)   . Insomnia   . Low back pain   . TIA (transient ischemic attack)   . Vitamin B12 deficiency   . Vitamin D deficiency     There are no problems to display for this patient.   Past Surgical History:  Procedure Laterality Date  . BACK SURGERY    . BREAST EXCISIONAL BIOPSY Left 1997   benign  . hemorrhoidecotmy    . HERNIA MESH REMOVAL    . ORIF RADIUS & ULNA FRACTURES       OB History   No obstetric history on file.     Family History  Problem Relation Age of Onset  . Breast cancer Sister   . Breast cancer Maternal Aunt   . Breast cancer Paternal Aunt     Social History   Tobacco Use  . Smoking status: Current Every Day Smoker    Packs/day: 1.00  . Smokeless tobacco: Never Used  Substance Use Topics  . Alcohol use: Yes    Comment: occ once every 2 months  . Drug use: Never    Home Medications Prior to Admission medications   Medication Sig Start Date End Date Taking? Authorizing Provider  carboxymethylcellulose  (REFRESH PLUS) 0.5 % SOLN 1 drop 3 (three) times daily as needed.    [provider]  carisoprodol (SOMA) 350 MG tablet TK 1 T PO TID. 09/26/17   [provider]  clonazePAM (KLONOPIN) 1 MG tablet TK 1 T PO QD HS 10/15/17   [provider]  clopidogrel (PLAVIX) 75 MG tablet  10/16/17   [provider]  fluticasone (FLONASE) 50 MCG/ACT nasal spray U 2 SPRAYS IEN ONCE D 10/11/17   [provider]  gabapentin (NEURONTIN) 600 MG tablet Take 600 mg by mouth 2 (two) times daily.  09/16/17   [provider]  LINZESS 290 MCG CAPS capsule TK ONE C PO ONCE A DAY WITH FIRST MEAL 09/13/17   [provider]  losartan (COZAAR) 100 MG tablet  08/27/17   [provider]  omeprazole (PRILOSEC) 20 MG capsule  08/27/17   [provider]  polyethylene glycol (MIRALAX / GLYCOLAX) packet Take 17 g by mouth daily.    [provider]  rosuvastatin (CRESTOR) 5 MG tablet TAKE 1 TABLET(5 MG) BY MOUTH DAILY 06/23/19   Frann Rider, NP  traMADol (ULTRAM) 50 MG tablet TK 1 T PO TID PRF PAIN  10/11/17   [provider]  VENTOLIN HFA 108 (90 Base) MCG/ACT inhaler INL 2 PFS PO Q 6 H PRN 09/30/17   [provider]  Vitamin D, Ergocalciferol, 2000 units CAPS Take by mouth.    [provider]  zaleplon (SONATA) 10 MG capsule Take 10 mg by mouth at bedtime as needed for sleep.    [provider]    Allergies    Augmentin [amoxicillin-pot clavulanate], Levaquin [levofloxacin in d5w], Mom [magnesium hydroxide], and Oxycodone  Review of Systems   Review of Systems  Musculoskeletal:       R wrist pain   All other systems reviewed and are negative.   Physical Exam Updated Vital Signs BP 123/71 (BP Location: Left Arm)   Pulse 96   Temp 97.9 F (36.6 C) (Oral)   Resp 15   Ht 5\' 7"  (1.702 m)   Wt 78 kg   SpO2 96%   BMI 26.94 kg/m   Physical Exam Vitals and nursing note reviewed.  HENT:     Head:  Atraumatic.     Nose: Nose normal.     Mouth/Throat:     Mouth: Mucous membranes are moist.  Eyes:     Extraocular Movements: Extraocular movements intact.     Pupils: Pupils are equal, round, and reactive to light.  Cardiovascular:     Rate and Rhythm: Normal rate and regular rhythm.     Pulses: Normal pulses.  Pulmonary:     Effort: Pulmonary effort is normal.     Breath sounds: Normal breath sounds.  Abdominal:     General: Abdomen is flat.     Palpations: Abdomen is soft.  Musculoskeletal:     Cervical back: Normal range of motion.     Comments: R wrist with obvious deformity.  Patient able to wiggle fingers.  Normal capillary refill.  Able to palpate radial pulse.  No spinal tenderness.  Normal range of motion of bilateral hips.   Skin:    General: Skin is warm.     Capillary Refill: Capillary refill takes less than 2 seconds.  Neurological:     General: No focal deficit present.     Mental Status: She is alert.  Psychiatric:        Mood and Affect: Mood normal.        Behavior: Behavior normal.     ED Results / Procedures / Treatments   Labs (all labs ordered are listed, but only abnormal results are displayed) Labs Reviewed  CBC WITH DIFFERENTIAL/PLATELET - Abnormal; Notable for the following components:      Result Value   MCV 102.9 (*)    MCH 35.0 (*)    All other components within normal limits  RESPIRATORY PANEL BY RT PCR (FLU A&B, COVID)  BASIC METABOLIC PANEL    EKG None  Radiology No results found.  Procedures Procedures (including critical care time)  SPLINT APPLICATION Date/Time: 9:98 PM Authorized by: Wandra Arthurs Consent: Verbal consent obtained. Risks and benefits: risks, benefits and alternatives were discussed Consent given by: patient Splint applied by: orthopedic technician and myself Location details: R wrist Splint type: sugar tongue Supplies used: fiber glass Post-procedure: The splinted body part was neurovascularly unchanged  following the procedure. Patient tolerance: Patient tolerated the procedure well with no immediate complications.     Medications Ordered in ED Medications  HYDROmorphone (DILAUDID) injection 1 mg (1 mg Intravenous Given 11/17/19 1700)  ondansetron (ZOFRAN) injection 4 mg (4 mg Intravenous Given 11/17/19  1700)    ED Course  I have reviewed the triage vital signs and the nursing notes.  Pertinent labs & imaging results that were available during my care of the patient were reviewed by me and considered in my medical decision making (see chart for details).    MDM Rules/Calculators/A&P                          Patricia [name] is a 69 y.o. female here presenting with fall with obvious right hip deformity.  Concern for possible right wrist fracture.  Plan to get x-rays and consult hand.  Will give pain medicine as well.  8:39 PM X-ray showed distal ulna and radius fractures.  Consulted Dr. Amedeo Plenty from hand surgery.  He recommended sugar tong splint which I applied with Ortho tech.  He also examined the patient afterwards and schedule her for surgery on Thursday.  Will hold Plavix until then.  Patient's pain is under control.  Was stable for discharge.  Dr. Amedeo Plenty prescribed pain medicine and nausea medicine for her already.   Final Clinical Impression(s) / ED Diagnoses Final diagnoses:  None    Rx / DC Orders ED Discharge Orders    None       Drenda Freeze, MD 11/17/19 2040

## 2019-11-17 NOTE — ED Notes (Signed)
Ortho tech at bedside, sling in place.

## 2019-11-17 NOTE — Progress Notes (Signed)
Orthopedic Tech Progress Note Patient Details:  Patricia [name] 1950-05-28 [id number]  Ortho Devices Type of Ortho Device: Sugartong splint Ortho Device/Splint Location: RUE Ortho Device/Splint Interventions: Ordered, Application, Adjustment   Post Interventions Patient Tolerated: Well Instructions Provided: Care of device, Poper ambulation with device   [name] 11/17/2019, 7:30 PM

## 2019-11-17 NOTE — Progress Notes (Signed)
Orthopedic Tech Progress Note Patient Details:  Beretta Ginsberg 04/26/1950 [id number]  Ortho Devices Type of Ortho Device: Sling immobilizer Ortho Device/Splint Location: Right Upper Extremity Ortho Device/Splint Interventions: Ordered, Application   Post Interventions Patient Tolerated: Well Instructions Provided: Adjustment of device, Care of device, Poper ambulation with device   Tammy Sours 11/17/2019, 9:31 PM

## 2019-11-17 NOTE — Discharge Instructions (Signed)
Take Zofran and oxycodone as prescribed by Dr. Amedeo Plenty for nausea and pain.  Please hold your Plavix.  You have an appointment for surgery with Dr. Amedeo Plenty on Thursday.  Return to ER if you have worse wrist pain, fingers turning blue.

## 2019-11-18 ENCOUNTER — Other Ambulatory Visit: Payer: Self-pay

## 2019-11-18 ENCOUNTER — Encounter (HOSPITAL_COMMUNITY): Payer: Self-pay | Admitting: Orthopedic Surgery

## 2019-11-18 NOTE — Progress Notes (Signed)
Patient denies shortness of breath, fever, cough or chest pain.  PCP - Dr [name] Cardiologist - Dr Allegra Lai  Chest x-ray - n/a EKG - DOS 11/19/19 Stress Test - n/a ECHO - 10/25/17 Cardiac Cath - n/a  ERAS: Clears til 2:30 pm  Plavix last dose was on 11/17/19.  STOP now taking any Aspirin (unless otherwise instructed by your surgeon), Aleve, Naproxen, Ibuprofen, Motrin, Advil, Goody's, BC's, all herbal medications, fish oil, and all vitamins.   Coronavirus Screening Covid test on 11/17/19 was negative.  Patient verbalized understanding of instructions that were given via phone.

## 2019-11-19 ENCOUNTER — Encounter (HOSPITAL_COMMUNITY)
Admission: RE | Disposition: A | Discharge status: Home / Self Care | Payer: Self-pay | Source: Ambulatory Visit | Attending: Orthopedic Surgery

## 2019-11-19 ENCOUNTER — Encounter (HOSPITAL_COMMUNITY): Payer: Self-pay | Admitting: Orthopedic Surgery

## 2019-11-19 ENCOUNTER — Ambulatory Visit (HOSPITAL_COMMUNITY): Payer: Medicare HMO | Admitting: Anesthesiology

## 2019-11-19 ENCOUNTER — Observation Stay (HOSPITAL_COMMUNITY)
Admission: RE | Admit: 2019-11-19 | Discharge: 2019-11-20 | Disposition: A | Discharge status: Home / Self Care | Payer: Medicare HMO | Source: Ambulatory Visit | Attending: Orthopedic Surgery | Admitting: Orthopedic Surgery

## 2019-11-19 ENCOUNTER — Ambulatory Visit (HOSPITAL_COMMUNITY): Payer: Medicare HMO

## 2019-11-19 DIAGNOSIS — X58XXXA Exposure to other specified factors, initial encounter: Secondary | ICD-10-CM | POA: Diagnosis not present

## 2019-11-19 DIAGNOSIS — F17210 Nicotine dependence, cigarettes, uncomplicated: Secondary | ICD-10-CM | POA: Diagnosis not present

## 2019-11-19 DIAGNOSIS — Z79899 Other long term (current) drug therapy: Secondary | ICD-10-CM | POA: Diagnosis not present

## 2019-11-19 DIAGNOSIS — G5601 Carpal tunnel syndrome, right upper limb: Secondary | ICD-10-CM | POA: Diagnosis not present

## 2019-11-19 DIAGNOSIS — I10 Essential (primary) hypertension: Secondary | ICD-10-CM | POA: Diagnosis not present

## 2019-11-19 DIAGNOSIS — S52501A Unspecified fracture of the lower end of right radius, initial encounter for closed fracture: Secondary | ICD-10-CM | POA: Diagnosis not present

## 2019-11-19 DIAGNOSIS — M6281 Muscle weakness (generalized): Secondary | ICD-10-CM | POA: Diagnosis not present

## 2019-11-19 DIAGNOSIS — S52291A Other fracture of shaft of right ulna, initial encounter for closed fracture: Secondary | ICD-10-CM | POA: Diagnosis not present

## 2019-11-19 DIAGNOSIS — E785 Hyperlipidemia, unspecified: Secondary | ICD-10-CM | POA: Diagnosis not present

## 2019-11-19 DIAGNOSIS — Z7901 Long term (current) use of anticoagulants: Secondary | ICD-10-CM | POA: Diagnosis not present

## 2019-11-19 DIAGNOSIS — S52601A Unspecified fracture of lower end of right ulna, initial encounter for closed fracture: Principal | ICD-10-CM | POA: Insufficient documentation

## 2019-11-19 DIAGNOSIS — S52571A Other intraarticular fracture of lower end of right radius, initial encounter for closed fracture: Secondary | ICD-10-CM | POA: Diagnosis not present

## 2019-11-19 DIAGNOSIS — S52531A Colles' fracture of right radius, initial encounter for closed fracture: Secondary | ICD-10-CM | POA: Diagnosis present

## 2019-11-19 HISTORY — PX: OPEN REDUCTION INTERNAL FIXATION (ORIF) DISTAL RADIAL FRACTURE: SHX5989

## 2019-11-19 SURGERY — OPEN REDUCTION INTERNAL FIXATION (ORIF) DISTAL RADIUS FRACTURE
Anesthesia: Monitor Anesthesia Care | Site: Wrist | Laterality: Right

## 2019-11-19 MED ORDER — PHENYLEPHRINE HCL-NACL 10-0.9 MG/250ML-% IV SOLN
INTRAVENOUS | Status: DC | PRN
  Administered 2019-11-19: 50 ug/min via INTRAVENOUS

## 2019-11-19 MED ORDER — CEFAZOLIN SODIUM-DEXTROSE 1-4 GM/50ML-% IV SOLN: 1.0000 g | Freq: Once | INTRAVENOUS | Status: DC

## 2019-11-19 MED ORDER — LACTATED RINGERS IV SOLN: INTRAVENOUS | Status: DC | PRN

## 2019-11-19 MED ORDER — OXYCODONE HCL 5 MG PO TABS
10.0000 mg | ORAL_TABLET | ORAL | Status: DC | PRN
  Administered 2019-11-20 (×2): 15 mg via ORAL
  Filled 2019-11-19 (×2): qty 3

## 2019-11-19 MED ORDER — PROPOFOL 500 MG/50ML IV EMUL
INTRAVENOUS | Status: DC | PRN
  Administered 2019-11-19: 75 ug/kg/min via INTRAVENOUS

## 2019-11-19 MED ORDER — PROPOFOL 10 MG/ML IV BOLUS
INTRAVENOUS | Status: AC
  Filled 2019-11-19: qty 40

## 2019-11-19 MED ORDER — LACTATED RINGERS IV SOLN: INTRAVENOUS | Status: DC

## 2019-11-19 MED ORDER — ONDANSETRON HCL 4 MG/2ML IJ SOLN
INTRAMUSCULAR | Status: AC
  Filled 2019-11-19: qty 2

## 2019-11-19 MED ORDER — 0.9 % SODIUM CHLORIDE (POUR BTL) OPTIME
TOPICAL | Status: DC | PRN
  Administered 2019-11-19: 1000 mL

## 2019-11-19 MED ORDER — LOSARTAN POTASSIUM 50 MG PO TABS
100.0000 mg | ORAL_TABLET | Freq: Every day | ORAL | Status: DC
  Administered 2019-11-20: 100 mg via ORAL
  Filled 2019-11-19: qty 2

## 2019-11-19 MED ORDER — LIDOCAINE 2% (20 MG/ML) 5 ML SYRINGE
INTRAMUSCULAR | Status: AC
  Filled 2019-11-19: qty 10

## 2019-11-19 MED ORDER — OXYCODONE HCL 5 MG PO TABS: 5.0000 mg | ORAL_TABLET | Freq: Once | ORAL | Status: DC | PRN

## 2019-11-19 MED ORDER — FENTANYL CITRATE (PF) 250 MCG/5ML IJ SOLN
INTRAMUSCULAR | Status: AC
  Filled 2019-11-19: qty 5

## 2019-11-19 MED ORDER — METHOCARBAMOL 1000 MG/10ML IJ SOLN
500.0000 mg | Freq: Four times a day (QID) | INTRAVENOUS | Status: DC | PRN
  Filled 2019-11-19: qty 5

## 2019-11-19 MED ORDER — HYDROMORPHONE HCL 1 MG/ML IJ SOLN: 0.5000 mg | INTRAMUSCULAR | Status: DC | PRN

## 2019-11-19 MED ORDER — PANTOPRAZOLE SODIUM 40 MG PO TBEC
40.0000 mg | DELAYED_RELEASE_TABLET | Freq: Every day | ORAL | Status: DC
  Administered 2019-11-20: 40 mg via ORAL
  Filled 2019-11-19: qty 1

## 2019-11-19 MED ORDER — ONDANSETRON HCL 4 MG/2ML IJ SOLN: 4.0000 mg | Freq: Four times a day (QID) | INTRAMUSCULAR | Status: DC | PRN

## 2019-11-19 MED ORDER — PHENYLEPHRINE 40 MCG/ML (10ML) SYRINGE FOR IV PUSH (FOR BLOOD PRESSURE SUPPORT)
PREFILLED_SYRINGE | INTRAVENOUS | Status: AC
  Filled 2019-11-19: qty 20

## 2019-11-19 MED ORDER — CEFAZOLIN SODIUM-DEXTROSE 1-4 GM/50ML-% IV SOLN
1.0000 g | Freq: Once | INTRAVENOUS | Status: DC
  Filled 2019-11-19: qty 50

## 2019-11-19 MED ORDER — PHENYLEPHRINE HCL (PRESSORS) 10 MG/ML IV SOLN
INTRAVENOUS | Status: DC | PRN
  Administered 2019-11-19: 100 ug via INTRAVENOUS
  Administered 2019-11-19: 50 ug via INTRAVENOUS

## 2019-11-19 MED ORDER — ONDANSETRON HCL 4 MG/2ML IJ SOLN
INTRAMUSCULAR | Status: DC | PRN
  Administered 2019-11-19: 4 mg via INTRAVENOUS

## 2019-11-19 MED ORDER — CEFAZOLIN SODIUM-DEXTROSE 2-3 GM-%(50ML) IV SOLR
INTRAVENOUS | Status: DC | PRN
  Administered 2019-11-19: 2 g via INTRAVENOUS

## 2019-11-19 MED ORDER — ONDANSETRON HCL 4 MG PO TABS: 4.0000 mg | ORAL_TABLET | Freq: Three times a day (TID) | ORAL | Status: DC | PRN

## 2019-11-19 MED ORDER — ACETAMINOPHEN 10 MG/ML IV SOLN: 1000.0000 mg | Freq: Once | INTRAVENOUS | Status: DC | PRN

## 2019-11-19 MED ORDER — CHLORHEXIDINE GLUCONATE 0.12 % MT SOLN
15.0000 mL | Freq: Once | OROMUCOSAL | Status: AC
  Administered 2019-11-19: 15 mL via OROMUCOSAL
  Filled 2019-11-19: qty 15

## 2019-11-19 MED ORDER — ASCORBIC ACID 500 MG PO TABS
1000.0000 mg | ORAL_TABLET | Freq: Every day | ORAL | Status: DC
  Administered 2019-11-20: 1000 mg via ORAL
  Filled 2019-11-19: qty 2

## 2019-11-19 MED ORDER — CEFAZOLIN SODIUM-DEXTROSE 2-4 GM/100ML-% IV SOLN
2.0000 g | INTRAVENOUS | Status: DC
  Filled 2019-11-19: qty 100

## 2019-11-19 MED ORDER — OXYCODONE HCL 5 MG PO TABS: 5.0000 mg | ORAL_TABLET | ORAL | Status: DC | PRN

## 2019-11-19 MED ORDER — DOCUSATE SODIUM 100 MG PO CAPS
100.0000 mg | ORAL_CAPSULE | Freq: Two times a day (BID) | ORAL | Status: DC
  Administered 2019-11-20 (×2): 100 mg via ORAL
  Filled 2019-11-19 (×2): qty 1

## 2019-11-19 MED ORDER — MIDAZOLAM HCL 2 MG/2ML IJ SOLN: 2.0000 mg | Freq: Once | INTRAMUSCULAR | Status: AC

## 2019-11-19 MED ORDER — FENTANYL CITRATE (PF) 100 MCG/2ML IJ SOLN
INTRAMUSCULAR | Status: DC | PRN
  Administered 2019-11-19: 25 ug via INTRAVENOUS

## 2019-11-19 MED ORDER — ACETAMINOPHEN 500 MG PO TABS: 1000.0000 mg | ORAL_TABLET | Freq: Once | ORAL | Status: DC | PRN

## 2019-11-19 MED ORDER — MIDAZOLAM HCL 2 MG/2ML IJ SOLN
INTRAMUSCULAR | Status: AC
  Administered 2019-11-19: 1 mg via INTRAVENOUS
  Filled 2019-11-19: qty 2

## 2019-11-19 MED ORDER — METHOCARBAMOL 500 MG PO TABS: 500.0000 mg | ORAL_TABLET | Freq: Four times a day (QID) | ORAL | Status: DC | PRN

## 2019-11-19 MED ORDER — ACETAMINOPHEN 160 MG/5ML PO SOLN: 1000.0000 mg | Freq: Once | ORAL | Status: DC | PRN

## 2019-11-19 MED ORDER — ROCURONIUM BROMIDE 10 MG/ML (PF) SYRINGE
PREFILLED_SYRINGE | INTRAVENOUS | Status: AC
  Filled 2019-11-19: qty 10

## 2019-11-19 MED ORDER — POLYETHYLENE GLYCOL 3350 17 G PO PACK: 34.0000 g | PACK | Freq: Every morning | ORAL | Status: DC

## 2019-11-19 MED ORDER — FENTANYL CITRATE (PF) 100 MCG/2ML IJ SOLN
INTRAMUSCULAR | Status: AC
  Administered 2019-11-19: 100 ug via INTRAVENOUS
  Filled 2019-11-19: qty 2

## 2019-11-19 MED ORDER — ACETAMINOPHEN 325 MG PO TABS: 325.0000 mg | ORAL_TABLET | Freq: Four times a day (QID) | ORAL | Status: DC | PRN

## 2019-11-19 MED ORDER — DEXAMETHASONE SODIUM PHOSPHATE 10 MG/ML IJ SOLN
INTRAMUSCULAR | Status: AC
  Filled 2019-11-19: qty 1

## 2019-11-19 MED ORDER — ONDANSETRON HCL 4 MG PO TABS: 4.0000 mg | ORAL_TABLET | Freq: Four times a day (QID) | ORAL | Status: DC | PRN

## 2019-11-19 MED ORDER — BUPIVACAINE-EPINEPHRINE (PF) 0.5% -1:200000 IJ SOLN
INTRAMUSCULAR | Status: DC | PRN
  Administered 2019-11-19: 30 mL via PERINEURAL

## 2019-11-19 MED ORDER — LIDOCAINE-EPINEPHRINE (PF) 1.5 %-1:200000 IJ SOLN
INTRAMUSCULAR | Status: DC | PRN
  Administered 2019-11-19: 10 mL via PERINEURAL

## 2019-11-19 MED ORDER — OXYCODONE HCL 5 MG/5ML PO SOLN: 5.0000 mg | Freq: Once | ORAL | Status: DC | PRN

## 2019-11-19 MED ORDER — ACETAMINOPHEN 500 MG PO TABS
1000.0000 mg | ORAL_TABLET | Freq: Four times a day (QID) | ORAL | Status: DC
  Administered 2019-11-20 (×3): 1000 mg via ORAL
  Filled 2019-11-19 (×3): qty 2

## 2019-11-19 MED ORDER — ORAL CARE MOUTH RINSE: 15.0000 mL | Freq: Once | OROMUCOSAL | Status: AC

## 2019-11-19 MED ORDER — PROMETHAZINE HCL 12.5 MG RE SUPP
12.5000 mg | Freq: Four times a day (QID) | RECTAL | Status: DC | PRN
  Filled 2019-11-19: qty 1

## 2019-11-19 MED ORDER — CEFAZOLIN SODIUM-DEXTROSE 1-4 GM/50ML-% IV SOLN
1.0000 g | Freq: Three times a day (TID) | INTRAVENOUS | Status: DC
  Administered 2019-11-20 (×2): 1 g via INTRAVENOUS
  Filled 2019-11-19 (×2): qty 50

## 2019-11-19 MED ORDER — FENTANYL CITRATE (PF) 100 MCG/2ML IJ SOLN: 100.0000 ug | Freq: Once | INTRAMUSCULAR | Status: AC

## 2019-11-19 MED ORDER — FENTANYL CITRATE (PF) 100 MCG/2ML IJ SOLN: 25.0000 ug | INTRAMUSCULAR | Status: DC | PRN

## 2019-11-19 MED ORDER — ALPRAZOLAM 0.5 MG PO TABS: 0.5000 mg | ORAL_TABLET | Freq: Four times a day (QID) | ORAL | Status: DC | PRN

## 2019-11-19 SURGICAL SUPPLY — 72 items
BIT DRILL 2.2 SS TIBIAL (BIT) ×3 IMPLANT
BLADE CLIPPER SURG (BLADE) IMPLANT
BNDG CMPR 9X4 STRL LF SNTH (GAUZE/BANDAGES/DRESSINGS) ×1
BNDG ELASTIC 3X5.8 VLCR STR LF (GAUZE/BANDAGES/DRESSINGS) ×3 IMPLANT
BNDG ELASTIC 4X5.8 VLCR STR LF (GAUZE/BANDAGES/DRESSINGS) ×3 IMPLANT
BNDG ESMARK 4X9 LF (GAUZE/BANDAGES/DRESSINGS) ×3 IMPLANT
BNDG GAUZE ELAST 4 BULKY (GAUZE/BANDAGES/DRESSINGS) ×6 IMPLANT
CORD BIPOLAR FORCEPS 12FT (ELECTRODE) ×3 IMPLANT
COVER SURGICAL LIGHT HANDLE (MISCELLANEOUS) ×3 IMPLANT
COVER WAND RF STERILE (DRAPES) ×3 IMPLANT
CUFF TOURN SGL QUICK 18X4 (TOURNIQUET CUFF) ×3 IMPLANT
CUFF TOURN SGL QUICK 24 (TOURNIQUET CUFF)
CUFF TRNQT CYL 24X4X16.5-23 (TOURNIQUET CUFF) IMPLANT
DECANTER SPIKE VIAL GLASS SM (MISCELLANEOUS) IMPLANT
DRAIN TLS ROUND 10FR (DRAIN) IMPLANT
DRAPE OEC MINIVIEW 54X84 (DRAPES) ×3 IMPLANT
DRAPE U-SHAPE 47X51 STRL (DRAPES) IMPLANT
DRSG ADAPTIC 3X8 NADH LF (GAUZE/BANDAGES/DRESSINGS) ×6 IMPLANT
GAUZE SPONGE 4X4 12PLY STRL (GAUZE/BANDAGES/DRESSINGS) ×6 IMPLANT
GAUZE XEROFORM 5X9 LF (GAUZE/BANDAGES/DRESSINGS) ×6 IMPLANT
GLOVE BIOGEL M 8.0 STRL (GLOVE) IMPLANT
GLOVE SS BIOGEL STRL SZ 8 (GLOVE) ×1 IMPLANT
GLOVE SUPERSENSE BIOGEL SZ 8 (GLOVE) ×2
GOWN STRL REUS W/ TWL LRG LVL3 (GOWN DISPOSABLE) ×2 IMPLANT
GOWN STRL REUS W/ TWL XL LVL3 (GOWN DISPOSABLE) ×1 IMPLANT
GOWN STRL REUS W/TWL LRG LVL3 (GOWN DISPOSABLE) ×6
GOWN STRL REUS W/TWL XL LVL3 (GOWN DISPOSABLE) ×3
KIT BASIN OR (CUSTOM PROCEDURE TRAY) ×3 IMPLANT
KIT TURNOVER KIT B (KITS) ×3 IMPLANT
MANIFOLD NEPTUNE II (INSTRUMENTS) ×3 IMPLANT
NEEDLE 22X1 1/2 (OR ONLY) (NEEDLE) IMPLANT
NS IRRIG 1000ML POUR BTL (IV SOLUTION) ×3 IMPLANT
PACK ORTHO EXTREMITY (CUSTOM PROCEDURE TRAY) ×3 IMPLANT
PAD ARMBOARD 7.5X6 YLW CONV (MISCELLANEOUS) ×6 IMPLANT
PAD CAST 3X4 CTTN HI CHSV (CAST SUPPLIES) ×2 IMPLANT
PAD CAST 4YDX4 CTTN HI CHSV (CAST SUPPLIES) IMPLANT
PADDING CAST COTTON 3X4 STRL (CAST SUPPLIES) ×6
PADDING CAST COTTON 4X4 STRL (CAST SUPPLIES)
PADDING UNDERCAST 2 STRL (CAST SUPPLIES) ×2
PADDING UNDERCAST 2X4 STRL (CAST SUPPLIES) ×1 IMPLANT
PEG LOCKING SMOOTH 2.2X18 (Peg) ×6 IMPLANT
PEG LOCKING SMOOTH 2.2X20 (Screw) ×15 IMPLANT
PILLOW ARM CARTER ADULT (MISCELLANEOUS) ×3 IMPLANT
PLATE DVR RT. RADIAL (Plate) ×3 IMPLANT
PLATE VOLAR RIM ST RT (Plate) ×3 IMPLANT
SCREW LOCK 12X2.7X 3 LD (Screw) ×4 IMPLANT
SCREW LOCK 14X2.7X 3 LD TPR (Screw) ×2 IMPLANT
SCREW LOCKING 2.7X12MM (Screw) ×12 IMPLANT
SCREW LOCKING 2.7X13MM (Screw) ×3 IMPLANT
SCREW LOCKING 2.7X14 (Screw) ×6 IMPLANT
SCREW LOCKING 2.7X15MM (Screw) ×3 IMPLANT
SCREW MULTI DIRECTIONAL 2.7X12 (Screw) ×3 IMPLANT
SCREW MULTI DIRECTIONAL 2.7X18 (Screw) ×3 IMPLANT
SOL PREP POV-IOD 4OZ 10% (MISCELLANEOUS) ×3 IMPLANT
SPLINT FIBERGLASS 3X12 (CAST SUPPLIES) ×3 IMPLANT
SPLINT FIBERGLASS 3X35 (CAST SUPPLIES) ×3 IMPLANT
SPONGE LAP 4X18 RFD (DISPOSABLE) IMPLANT
SUT MNCRL AB 4-0 PS2 18 (SUTURE) IMPLANT
SUT PROLENE 3 0 PS 1 (SUTURE) ×12 IMPLANT
SUT PROLENE 3 0 PS 2 (SUTURE) ×3 IMPLANT
SUT PROLENE 4 0 PS 2 18 (SUTURE) ×3 IMPLANT
SUT VIC AB 3-0 FS2 27 (SUTURE) ×3 IMPLANT
SYR CONTROL 10ML LL (SYRINGE) IMPLANT
SYSTEM CHEST DRAIN TLS 7FR (DRAIN) IMPLANT
TOWEL GREEN STERILE (TOWEL DISPOSABLE) ×3 IMPLANT
TOWEL GREEN STERILE FF (TOWEL DISPOSABLE) ×3 IMPLANT
TRAP DIGIT (INSTRUMENTS) ×3 IMPLANT
TUBE CONNECTING 12'X1/4 (SUCTIONS) ×1
TUBE CONNECTING 12X1/4 (SUCTIONS) ×2 IMPLANT
TUBE EVACUATION TLS (MISCELLANEOUS) IMPLANT
UNDERPAD 30X36 HEAVY ABSORB (UNDERPADS AND DIAPERS) ×3 IMPLANT
WATER STERILE IRR 1000ML POUR (IV SOLUTION) ×3 IMPLANT

## 2019-11-19 NOTE — Anesthesia Procedure Notes (Signed)
Procedure Name: MAC Date/Time: 11/19/2019 7:25 PM Performed by: Eligha Bridegroom, CRNA Pre-anesthesia Checklist: Patient identified, Emergency Drugs available, Suction available, Patient being monitored and Timeout performed Patient Re-evaluated:Patient Re-evaluated prior to induction Oxygen Delivery Method: Nasal cannula

## 2019-11-19 NOTE — Anesthesia Postprocedure Evaluation (Signed)
Anesthesia Post Note  Patient: Patricia [name]  Procedure(s) Performed: OPEN REDUCTION INTERNAL FIXATION (ORIF) DISTAL RADIUS AND ULNA FRACTURE WITH REPAIR AS NECESSARY (Right Wrist)     Patient location during evaluation: PACU Anesthesia Type: Regional Level of consciousness: awake and alert Pain management: pain level controlled Vital Signs Assessment: post-procedure vital signs reviewed and stable Respiratory status: spontaneous breathing, nonlabored ventilation, respiratory function stable and patient connected to nasal cannula oxygen Cardiovascular status: stable and blood pressure returned to baseline Postop Assessment: no apparent nausea or vomiting Anesthetic complications: no   No complications documented.  Last Vitals:  Vitals:   11/19/19 2215 11/19/19 2256  BP: 119/69 123/63  Pulse: 84 88  Resp: (!) 21 16  Temp: 37.1 C 36.8 C  SpO2: 99% 97%    Last Pain:  Vitals:   11/19/19 2215  TempSrc:   PainSc: 0-No pain                 Catalina Gravel

## 2019-11-19 NOTE — Transfer of Care (Signed)
Immediate Anesthesia Transfer of Care Note  Patient: Patricia [name]  Procedure(s) Performed: OPEN REDUCTION INTERNAL FIXATION (ORIF) DISTAL RADIUS AND ULNA FRACTURE WITH REPAIR AS NECESSARY (Right Wrist)  Patient Location: PACU  Anesthesia Type:MAC combined with regional for post-op pain  Level of Consciousness: awake and alert   Airway & Oxygen Therapy: Patient Spontanous Breathing  Post-op Assessment: Report given to RN and Post -op Vital signs reviewed and stable  Post vital signs: Reviewed and stable  Last Vitals:  Vitals Value Taken Time  BP    Temp    Pulse 94 11/19/19 2106  Resp    SpO2 89 % 11/19/19 2106  Vitals shown include unvalidated device data.  Last Pain:  Vitals:   11/19/19 1731  TempSrc:   PainSc: 0-No pain      Patients Stated Pain Goal: 3 (94/32/76 1470)  Complications: No complications documented.

## 2019-11-19 NOTE — Anesthesia Preprocedure Evaluation (Addendum)
Anesthesia Evaluation  Patient identified by MRN, date of birth, ID band Patient awake    Reviewed: Allergy & Precautions, NPO status , Patient's Chart, lab work & pertinent test results  Airway Mallampati: II  TM Distance: >3 FB Neck ROM: Full    Dental  (+) Dental Advisory Given   Pulmonary Current Smoker,  Covid-19 Nucleic Acid Test Results Lab Results      Component                Value               Date                      SARSCOV2NAA              NEGATIVE            11/17/2019              breath sounds clear to auscultation       Cardiovascular hypertension,  Rhythm:Regular  Left ventricle: The cavity size was normal. Systolic function was  normal. The estimated ejection fraction was in the range of 55%  to 60%. Wall motion was normal; there were no regional wall  motion abnormalities. Doppler parameters are consistent with  abnormal left ventricular relaxation (grade 1 diastolic  dysfunction). There was no evidence of elevated ventricular  filling pressure by Doppler parameters.  - Aortic valve: There was no regurgitation.  - Mitral valve: There was trivial regurgitation.  - Right ventricle: The cavity size was normal. Wall thickness was  normal. Systolic function was normal.  - Tricuspid valve: There was mild regurgitation.  - Pulmonary arteries: Systolic pressure was within the normal  range.  - Inferior vena cava: The vessel was normal in size.  - Pericardium, extracardiac: There was no pericardial effusion.    Neuro/Psych PSYCHIATRIC DISORDERS Anxiety TIA   GI/Hepatic   Endo/Other    Renal/GU      Musculoskeletal   Abdominal   Peds  Hematology   Anesthesia Other Findings   Reproductive/Obstetrics                           Anesthesia Physical Anesthesia Plan  ASA: II  Anesthesia Plan: MAC and Regional   Post-op Pain Management:    Induction:  Intravenous  PONV Risk Score and Plan: 1 and Propofol infusion and Treatment may vary due to age or medical condition  Airway Management Planned: Nasal Cannula  Additional Equipment: None  Intra-op Plan:   Post-operative Plan:   Informed Consent: I have reviewed the patients History and Physical, chart, labs and discussed the procedure including the risks, benefits and alternatives for the proposed anesthesia with the patient or authorized representative who has indicated his/her understanding and acceptance.     Dental advisory given  Plan Discussed with: CRNA and Surgeon  Anesthesia Plan Comments:         Anesthesia Quick Evaluation

## 2019-11-19 NOTE — Op Note (Signed)
Operative note November 19, 2019  Roseanne Kaufman MD  Preoperative diagnosis: Right distal radius fracture comminuted complex greater than 3 part intra-articular.  Right distal ulna fracture including the shaft and ulnar styloid displaced.  Numbness and tingling indicative of evolving carpal tunnel syndrome.  Postop diagnosis: Same  Procedure: #1 right open reduction internal fixation comminuted complex distal radius fracture with DVR [employer] cross lock plate and screw construct.  This was a greater than 3 part intra-articular fracture.  #2 AP lateral and oblique x-rays performed examined and interpreted by myself #3 right open reduction internal fixation ulna shaft fracture with Biomet plate #4 open right carpal tunnel release  Ruperto Kiernan MD  Anesthesia: Block with IV sedation  Estimated blood loss minimal  Complications none immediate  Operative indications the patient presents for evaluation and surgical care.  Patient understands risk benefits and desires to proceed.  We have discussed with the patient all issues plans and concerns with this in mind we will proceed accordingly. We are planning surgery for your upper extremity. The risk and benefits of surgery to include risk of bleeding, infection, anesthesia,  damage to normal structures and failure of the surgery to accomplish its intended goals of relieving symptoms and restoring function have been discussed in detail. With this in mind we plan to proceed. I have specifically discussed with the patient the pre-and postoperative regime and the dos and don'ts and risk and benefits in great detail. Risk and benefits of surgery also include risk of dystrophy(CRPS), chronic nerve pain, failure of the healing process to go onto completion and other inherent risks of surgery The relavent the pathophysiology of the disease/injury process, as well as the alternatives for treatment and postoperative course of action has been discussed in great detail with  the patient who desires to proceed.  We will do everything in our power to help you (the patient) restore function to the upper extremity. It is a pleasure to see this patient today.    Operative procedure: Patient was seen by myself and anesthesia.  Appropriate anesthesia was induced and following this the patient was prepped with a Hibiclens pre-scrub followed by 10-minute surgical Betadine scrub and paint. The patient had a preop exam which showed evolving carpal tunnel syndrome thus she was consented for carpal tunnel release.  She has some numbness and tingling that is evolving.  Given her history of blood thinners and the severity of the fractures this is not overly surprising.  She was consented and taken to the operative theater.  After prep and drape a timeout was observed of course.   Once this was completed the extremity was elevated and the tourniquet was insufflated to 250 mmHg.  Timeout was observed preoperative antibiotics were given and the patient then underwent a very careful and cautious approach to the extremity with volar radial incision under 250 mm tourniquet control.  FCR tendon sheath was identified and dissected.  There were no complicating features.  Once this was completed the carpal canal contents were retracted ulnarly and the FCR was retracted radially.  We took very meticulous care of the radial artery and the carpal canal contents during the approach.  The pronator was accessed incised and lifted off of the fracture.  The fracture was then reassembled with standard orthopedic equipment and a DVR plate and screw construct from [employer] was accomplished in terms of placement and fixation of the fracture.  I did use a volar rim plate given the complexity of the fracture I was  pleased with the recreation of anatomy and the stability.  Adequate radial height, volar tilt and radial inclination was restored.  The distal radial ulnar joint, radiocarpal and midcarpal joints all  were  stable and satisfactory.  We irrigated copiously and closed the pronator with 3-0 Vicryl followed by closure of the skin edge with Prolene.  Once again, the distal radius underwent open reduction internal fixation without complications.  Following ORIF of the radius and x-ray examination I then performed an open carpal tunnel release.  1 inch incision was made dissection was carried down the palmar fascia was incised.  Following incision of the palmar fascia retractor was placed and patient had the transverse carpal ligament released under 4.3 loupe magnification.  There was blood in the canal.  I verify complete distal release with fat pad aggression followed by distal to proximal dissection to release portions and antebrachial fascia and the proximal leaflet.  The median nerve was intact hyperemic and given the blood in the canal I felt this was a very wise decision.  That is the carpal tunnel release.  Following this we then turned attention towards the ulna patient was placed in a fingertrap traction tower tourniquet was deflated and a subcutaneous incision about the ulna border was made.  Dissection was carried down the fracture was accessed between the ECU and FCU interval.  This was reduced and a [employer] titanium plate was placed I was able achieve good reduction.  It was very fragile distally in terms of the bony architecture but I was pleased with the stability as tested under live fluoroscopy.  6 cortices proximal and 4 cortices distal were obtained.  Patient tolerated this well.  Area was irrigated and ultimately closed.  Thus ORIF of the radius distally with DVR plate and ORIF of an ulnar shaft fracture with associated carpal tunnel release and stress radiography was accomplished without difficulty.  There were no complicating features.  Given the patient's age and other issues I would like for her to spend the night for IV antibiotics and general postop observation I discussed this with  the patient's husband at great length.  All questions have been addressed.   Standard dressing of Adaptic Xeroform 4 x 4's gauze web roll Kerlix and a volar splint were applied.  The patient understands instructions of elevate move massage fingers notify us any problems occur and follow-up care according to our standard protocol for a DVR plate and screw construct.  Sugar tong cast/splint was applied noncompressive in nature to allow for additional swelling.  He has been a pleasure participate in the patient's care and we look forward to spent in the patient's recovery.   Given the complexity of the injury I do feel she has a long road ahead of her however we will do everything in our power to give her the best operative outcome possible Roseanne Kaufman MD

## 2019-11-19 NOTE — Anesthesia Procedure Notes (Signed)
Anesthesia Regional Block: Axillary brachial plexus block   Pre-Anesthetic Checklist: ,, timeout performed, Correct Patient, Correct Site, Correct Laterality, Correct Procedure, Correct Position, site marked, Risks and benefits discussed,  Surgical consent,  Pre-op evaluation,  At surgeon's request and post-op pain management  Laterality: Right and Upper  Prep: chloraprep       Needles:  Injection technique: Single-shot     Needle Length: 9cm  Needle Gauge: 22     Additional Needles: Arrow StimuQuik ECHO Echogenic Stimulating PNB Needle  Procedures:,,,, ultrasound used (permanent image in chart),,,,  Narrative:  Start time: 11/19/2019 4:41 PM End time: 11/19/2019 4:46 PM Injection made incrementally with aspirations every 5 mL.  Performed by: Personally  Anesthesiologist: Oleta Mouse, MD

## 2019-11-19 NOTE — H&P (Signed)
Patricia [name] is an 69 y.o. female.   Chief Complaint: Patient presents for right wrist reconstruction of the radius and ulna with repair is necessary. HPI: Patient presents for surgical management right distal radius and distal ulna fractures I have counseled the patient in regards to risk and benefits and she desires to proceed.  Patient presents for evaluation and treatment of the of their upper extremity predicament. The patient denies neck, back, chest or  abdominal pain. The patient notes that they have no lower extremity problems. The patients primary complaint is noted. We are planning surgical care pathway for the upper extremity.  Past Medical History:  Diagnosis Date  . Anxiety   . GERD (gastroesophageal reflux disease)   . Hyperlipidemia   . Hypertension   . IBS (irritable bowel syndrome)   . Insomnia   . Low back pain   . TIA (transient ischemic attack) 10/2017   no deficits  . Vitamin B12 deficiency   . Vitamin D deficiency     Past Surgical History:  Procedure Laterality Date  . BACK SURGERY    . BREAST EXCISIONAL BIOPSY Left 1997   benign  . hemorrhoidecotmy    . HERNIA MESH REMOVAL    . ORIF RADIUS & ULNA FRACTURES    . TONSILLECTOMY      Family History  Problem Relation Age of Onset  . Breast cancer Sister   . Breast cancer Maternal Aunt   . Breast cancer Paternal Aunt    Social History:  reports that she has been smoking cigarettes. She has a 50.00 pack-year smoking history. She has never used smokeless tobacco. She reports current alcohol use. She reports that she does not use drugs.  Allergies:  Allergies  Allergen Reactions  . Augmentin [Amoxicillin-Pot Clavulanate] Nausea Only and Other (See Comments)    GI upset  . Levaquin [Levofloxacin In D5w] Other (See Comments)    Joint problems   . Mom [Magnesium Hydroxide] Other (See Comments)    Welts   . Oxycodone Other (See Comments)    Nausea     Medications Prior to Admission   Medication Sig Dispense Refill  . Black Cohosh (REMIFEMIN PO) Take 1 tablet by mouth in the morning and at bedtime.    . carboxymethylcellulose (REFRESH PLUS) 0.5 % SOLN Place 1 drop into both eyes 3 (three) times daily as needed (for dryness).     . carisoprodol (SOMA) 350 MG tablet Take 350 mg by mouth 3 (three) times daily.   5  . Cholecalciferol (VITAMIN D3) 50 MCG (2000 UT) TABS Take 2,000 Units by mouth daily.    . clobetasol ointment (TEMOVATE) 3.24 % Apply 1 application topically See admin instructions. Apply to vaginal area daily as directed     . clonazePAM (KLONOPIN) 1 MG tablet Take 1 mg by mouth at bedtime.   3  . clopidogrel (PLAVIX) 75 MG tablet Take 75 mg by mouth daily.    Marland Kitchen gabapentin (NEURONTIN) 600 MG tablet Take 600 mg by mouth See admin instructions. Take 600 mg by mouth in the morning and at lunchtime    . hydrOXYzine (ATARAX/VISTARIL) 10 MG tablet Take 10 mg by mouth at bedtime as needed (for sleep).    Marland Kitchen LINZESS 290 MCG CAPS capsule Take 290 mcg by mouth daily at 4 PM.   6  . losartan (COZAAR) 100 MG tablet Take 100 mg by mouth daily.     Marland Kitchen omeprazole (PRILOSEC) 20 MG capsule Take 20 mg by mouth  daily before breakfast.     . ondansetron (ZOFRAN) 4 MG tablet Take 1 tablet (4 mg total) by mouth every 8 (eight) hours as needed for nausea or vomiting. 30 tablet 1  . oxyCODONE (ROXICODONE) 5 MG immediate release tablet Take 1 tablet (5 mg total) by mouth every 4 (four) hours as needed. 40 tablet 0  . polyethylene glycol (MIRALAX / GLYCOLAX) packet Take 34 g by mouth in the morning.     . rosuvastatin (CRESTOR) 5 MG tablet TAKE 1 TABLET(5 MG) BY MOUTH DAILY (Patient taking differently: Take 5 mg by mouth daily. ) 30 tablet 0  . traMADol (ULTRAM) 50 MG tablet Take 50-100 mg by mouth See admin instructions. Take 100 mg by mouth in the morning and 50 mg at lunchtime  4  . VENTOLIN HFA 108 (90 Base) MCG/ACT inhaler Inhale 2 puffs into the lungs every 6 (six) hours as needed for  wheezing or shortness of breath.   5  . zaleplon (SONATA) 10 MG capsule Take 10 mg by mouth at bedtime as needed (for interrupted sleep).     . fluticasone (FLONASE) 50 MCG/ACT nasal spray Place 2 sprays into both nostrils daily as needed for allergies or rhinitis.   5    No results found for this or any previous visit (from the past 48 hour(s)). No results found.  Review of Systems  Respiratory: Negative.   Cardiovascular: Negative.   Endocrine: Negative.   Genitourinary: Negative.     Blood pressure (!) 95/47, pulse 82, temperature 99.4 F (37.4 C), temperature source Oral, resp. rate 16, height 5' 7.5" (1.715 m), weight 78 kg, SpO2 100 %. Physical Exam  Comminuted fracture right distal radius with instability and displacement.  We will plan to proceed with surgical management.  Patient understands risk and benefits and desires to proceed.  There is no evidence of compartment syndrome or dystrophic abnormality at present time.  I reviewed this with the patient at length and the findings.  The patient is alert and oriented in no acute distress. The patient complains of pain in the affected upper extremity.  The patient is noted to have a normal HEENT exam. Lung fields show equal chest expansion and no shortness of breath. Abdomen exam is nontender without distention. Lower extremity examination does not show any fracture dislocation or blood clot symptoms. Pelvis is stable and the neck and back are stable and nontender. Assessment/Plan  We will plan for open reduction internal fixation comminuted complex radius and ulna fracture distally about the right upper extremity.  Based upon interoperative conditions we may perform a carpal tunnel release as well.  We are planning surgery for your upper extremity. The risk and benefits of surgery to include risk of bleeding, infection, anesthesia,  damage to normal structures and failure of the surgery to accomplish its intended goals of  relieving symptoms and restoring function have been discussed in detail. With this in mind we plan to proceed. I have specifically discussed with the patient the pre-and postoperative regime and the dos and don'ts and risk and benefits in great detail. Risk and benefits of surgery also include risk of dystrophy(CRPS), chronic nerve pain, failure of the healing process to go onto completion and other inherent risks of surgery The relavent the pathophysiology of the disease/injury process, as well as the alternatives for treatment and postoperative course of action has been discussed in great detail with the patient who desires to proceed.  We will do everything in our power  to help you (the patient) restore function to the upper extremity. It is a pleasure to see this patient today.   Willa Frater III, MD 11/19/2019, 5:39 PM

## 2019-11-20 ENCOUNTER — Encounter (HOSPITAL_COMMUNITY): Payer: Self-pay | Admitting: Orthopedic Surgery

## 2019-11-20 DIAGNOSIS — S52601A Unspecified fracture of lower end of right ulna, initial encounter for closed fracture: Secondary | ICD-10-CM | POA: Diagnosis not present

## 2019-11-20 DIAGNOSIS — M6281 Muscle weakness (generalized): Secondary | ICD-10-CM | POA: Diagnosis not present

## 2019-11-20 DIAGNOSIS — I10 Essential (primary) hypertension: Secondary | ICD-10-CM | POA: Diagnosis not present

## 2019-11-20 DIAGNOSIS — Z7901 Long term (current) use of anticoagulants: Secondary | ICD-10-CM | POA: Diagnosis not present

## 2019-11-20 DIAGNOSIS — F17210 Nicotine dependence, cigarettes, uncomplicated: Secondary | ICD-10-CM | POA: Diagnosis not present

## 2019-11-20 DIAGNOSIS — Z79899 Other long term (current) drug therapy: Secondary | ICD-10-CM | POA: Diagnosis not present

## 2019-11-20 MED ORDER — CLONAZEPAM 1 MG PO TABS
1.0000 mg | ORAL_TABLET | Freq: Every day | ORAL | Status: DC
  Administered 2019-11-20: 1 mg via ORAL
  Filled 2019-11-20: qty 1

## 2019-11-20 MED ORDER — NICOTINE 7 MG/24HR TD PT24
7.0000 mg | MEDICATED_PATCH | Freq: Every day | TRANSDERMAL | Status: DC
  Administered 2019-11-20: 7 mg via TRANSDERMAL
  Filled 2019-11-20: qty 1

## 2019-11-20 MED ORDER — DIPHENHYDRAMINE HCL 50 MG/ML IJ SOLN
25.0000 mg | Freq: Four times a day (QID) | INTRAMUSCULAR | Status: DC | PRN
  Administered 2019-11-20 (×2): 25 mg via INTRAVENOUS
  Filled 2019-11-20 (×2): qty 1
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
## 2019-11-20 NOTE — Discharge Instructions (Signed)
Elevate move massage keep your hands moving keep your arm elevated and call Dr. Amedeo Plenty for any problems.  Dr. Amedeo Plenty cell phone is [phone].  We will call for your follow-up appointment and 14 days.  We recommend that you to take vitamin C 1000 mg a day to promote healing. We also recommend that if you require  pain medicine that you take a stool softener to prevent constipation as most pain medicines will have constipation side effects. We recommend either Peri-Colace or Senokot and recommend that you also consider adding MiraLAX as well to prevent the constipation affects from pain medicine if you are required to use them. These medicines are over the counter and may be purchased at a local pharmacy. A cup of yogurt and a probiotic can also be helpful during the recovery process as the medicines can disrupt your intestinal environment. Keep bandage clean and dry.  Call for any problems.  No smoking.  Criteria for driving a car: you should be off your pain medicine for 7-8 hours, able to drive one handed(confident), thinking clearly and feeling able in your judgement to drive. Continue elevation as it will decrease swelling.  If instructed by MD move your fingers within the confines of the bandage/splint.  Use ice if instructed by your MD. Call immediately for any sudden loss of feeling in your hand/arm or change in functional abilities of the extremity.

## 2019-11-20 NOTE — Progress Notes (Signed)
Pt was given her AVS discharge papers and went over with her. IV was removed with catheter intact. Pt had no further questions.

## 2019-11-20 NOTE — Discharge Summary (Signed)
Physician Discharge Summary  Patient ID: Patricia [name] MRN: [id number] DOB/AGE: May 13, 1950 69 y.o.  Admit date: 11/19/2019 Discharge date:   Admission Diagnoses: Right distal radius and ulna fracture Past Medical History:  Diagnosis Date  . Anxiety   . GERD (gastroesophageal reflux disease)   . Hyperlipidemia   . Hypertension   . IBS (irritable bowel syndrome)   . Insomnia   . Low back pain   . TIA (transient ischemic attack) 10/2017   no deficits  . Vitamin B12 deficiency   . Vitamin D deficiency     Discharge Diagnoses:  Active Problems:   Colles' fracture of right radius, initial encounter for closed fracture   Surgeries: Procedure(s): OPEN REDUCTION INTERNAL FIXATION (ORIF) DISTAL RADIUS AND ULNA FRACTURE WITH REPAIR AS NECESSARY on 11/19/2019    Consultants:   Discharged Condition: Improved  Hospital Course: Patricia [name] is an 69 y.o. female who was admitted 11/19/2019 with a chief complaint of No chief complaint on file. , and found to have a diagnosis of Right distal radius and ulna fracture.  They were brought to the operating room on 11/19/2019 and underwent Procedure(s): OPEN REDUCTION INTERNAL FIXATION (ORIF) DISTAL RADIUS AND ULNA FRACTURE WITH REPAIR AS NECESSARY.    They were given perioperative antibiotics:  Anti-infectives (From admission, onward)   Start     Dose/Rate Route Frequency Ordered Stop   11/20/19 0700  ceFAZolin (ANCEF) IVPB 1 g/50 mL premix        1 g 100 mL/hr over 30 Minutes Intravenous Every 8 hours 11/19/19 2255     11/20/19 0600  ceFAZolin (ANCEF) IVPB 2g/100 mL premix  Status:  Discontinued        2 g 200 mL/hr over 30 Minutes Intravenous On call to O.R. 11/19/19 1506 11/19/19 2232   11/20/19 0200  ceFAZolin (ANCEF) IVPB 1 g/50 mL premix  Status:  Discontinued        1 g 100 mL/hr over 30 Minutes Intravenous  Once 11/19/19 2255 11/19/19 2301   11/19/19 2315  ceFAZolin (ANCEF) IVPB 1 g/50 mL premix        1 g 100 mL/hr  over 30 Minutes Intravenous  Once 11/19/19 2301      .  They were given sequential compression devices, early ambulation, and Other (comment) for DVT prophylaxis.  Recent vital signs:  Patient Vitals for the past 24 hrs:  BP Temp Temp src Pulse Resp SpO2  11/20/19 1425 127/71 98.6 F (37 C) Oral 83 17 93 %  11/20/19 0900 130/67 98.2 F (36.8 C) Oral 79 16 94 %  11/20/19 0432 122/65 98.2 F (36.8 C) Oral 83 15 91 %  11/20/19 0034 118/72 98 F (36.7 C) Oral 76 15 96 %  11/19/19 2256 123/63 98.2 F (36.8 C) Oral 88 16 97 %  11/19/19 2215 119/69 98.8 F (37.1 C) - 84 (!) 21 99 %  11/19/19 2200 126/77 - - 85 (!) 23 98 %  11/19/19 2145 (!) 118/54 - - 81 (!) 22 96 %  11/19/19 2130 123/75 - - 84 18 95 %  11/19/19 2115 - - - 100 19 90 %  11/19/19 2108 122/89 98.1 F (36.7 C) - - (!) 25 -  11/19/19 1731 (!) 95/47 - - 82 16 -  11/19/19 1725 91/69 - - 80 16 100 %  11/19/19 1720 (!) 88/26 - - 83 16 100 %  11/19/19 1715 (!) 94/46 - - 86 16 100 %  11/19/19 1710 (!) 90/35 - -  80 16 100 %  11/19/19 1705 (!) 90/39 - - 82 16 100 %  11/19/19 1700 (!) 89/39 - - 80 16 100 %  11/19/19 1655 (!) 95/40 - - 88 16 100 %  11/19/19 1650 - - - - 16 -  11/19/19 1645 (!) 75/29 - - 81 16 98 %  11/19/19 1635 (!) 112/58 - - 82 20 99 %  11/19/19 1615 (!) 111/58 - - 81 16 94 %  .  Recent laboratory studies: DG MINI C-ARM IMAGE ONLY  Result Date: 11/19/2019 There is no interpretation for this exam.  This order is for images obtained during a surgical procedure.  Please See "Surgeries" Tab for more information regarding the procedure.    Discharge Medications:   Allergies as of 11/20/2019      Reactions   Augmentin [amoxicillin-pot Clavulanate] Nausea Only, Other (See Comments)   GI upset   Levaquin [levofloxacin In D5w] Other (See Comments)   Joint problems   Mom [magnesium Hydroxide] Other (See Comments)   Welts   Oxycodone Other (See Comments)   Nausea      Medication List    TAKE these  medications   carboxymethylcellulose 0.5 % Soln Commonly known as: REFRESH PLUS Place 1 drop into both eyes 3 (three) times daily as needed (for dryness).   carisoprodol 350 MG tablet Commonly known as: SOMA Take 350 mg by mouth 3 (three) times daily.   clobetasol ointment 0.05 % Commonly known as: TEMOVATE Apply 1 application topically See admin instructions. Apply to vaginal area daily as directed   clonazePAM 1 MG tablet Commonly known as: KLONOPIN Take 1 mg by mouth at bedtime.   clopidogrel 75 MG tablet Commonly known as: PLAVIX Take 75 mg by mouth daily.   fluticasone 50 MCG/ACT nasal spray Commonly known as: FLONASE Place 2 sprays into both nostrils daily as needed for allergies or rhinitis.   gabapentin 600 MG tablet Commonly known as: NEURONTIN Take 600 mg by mouth See admin instructions. Take 600 mg by mouth in the morning and at lunchtime   hydrOXYzine 10 MG tablet Commonly known as: ATARAX/VISTARIL Take 10 mg by mouth at bedtime as needed (for sleep).   Linzess 290 MCG Caps capsule Generic drug: linaclotide Take 290 mcg by mouth daily at 4 PM.   losartan 100 MG tablet Commonly known as: COZAAR Take 100 mg by mouth daily.   omeprazole 20 MG capsule Commonly known as: PRILOSEC Take 20 mg by mouth daily before breakfast.   ondansetron 4 MG tablet Commonly known as: Zofran Take 1 tablet (4 mg total) by mouth every 8 (eight) hours as needed for nausea or vomiting.   oxyCODONE 5 MG immediate release tablet Commonly known as: Roxicodone Take 1 tablet (5 mg total) by mouth every 4 (four) hours as needed.   polyethylene glycol 17 g packet Commonly known as: MIRALAX / GLYCOLAX Take 34 g by mouth in the morning.   REMIFEMIN PO Take 1 tablet by mouth in the morning and at bedtime.   rosuvastatin 5 MG tablet Commonly known as: CRESTOR TAKE 1 TABLET(5 MG) BY MOUTH DAILY What changed: See the new instructions.   traMADol 50 MG tablet Commonly known as:  ULTRAM Take 50-100 mg by mouth See admin instructions. Take 100 mg by mouth in the morning and 50 mg at lunchtime   Ventolin HFA 108 (90 Base) MCG/ACT inhaler Generic drug: albuterol Inhale 2 puffs into the lungs every 6 (six) hours as needed for wheezing  or shortness of breath.   Vitamin D3 50 MCG (2000 UT) Tabs Take 2,000 Units by mouth daily.   zaleplon 10 MG capsule Commonly known as: SONATA Take 10 mg by mouth at bedtime as needed (for interrupted sleep).       Diagnostic Studies: DG Pelvis 1-2 Views  Result Date: 11/17/2019 CLINICAL DATA:  Pain following fall EXAM: PELVIS - 1-2 VIEW COMPARISON:  October 15, 2019 FINDINGS: There is no evidence of pelvic fracture or dislocation. Joint spaces appear unremarkable. No erosive change. There are surgical clips in the lower left pelvis. IMPRESSION: No appreciable fracture or dislocation. No appreciable joint space narrowing or erosion. Electronically Signed   By: Lowella Grip III M.D.   On: 11/17/2019 17:40   DG Wrist Complete Right  Result Date: 11/17/2019 CLINICAL DATA:  Pain following fall EXAM: RIGHT WRIST - COMPLETE 3+ VIEW COMPARISON:  None. FINDINGS: Frontal, oblique, and lateral views were obtained. There is a comminuted fracture of the distal radial metaphysis with lateral displacement dorsal angulation distally and impaction at fracture site. There is an obliquely oriented fracture of the distal diaphysis of the ulna with lateral displacement and angulation distally. There is avulsion of the ulnar styloid. No other fractures are evident. No dislocation. 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 IMPRESSION: Comminuted fracture distal radial metaphysis with lateral displacement and dorsal angulation distally. There is a degree of impaction at the fracture site. There is a fracture of the distal ulnar diaphysis with lateral displacement and angulation distally. Avulsion ulnar styloid. No  dislocation. 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 Electronically Signed   By: Lowella Grip III M.D.   On: 11/17/2019 17:39   MM 3D SCREEN BREAST BILATERAL  Result Date: 11/14/2019 CLINICAL DATA:  Screening. EXAM: DIGITAL SCREENING BILATERAL MAMMOGRAM WITH TOMO AND CAD COMPARISON:  Previous exam(s). ACR Breast Density Category b: There are scattered areas of fibroglandular density. FINDINGS: There are no findings suspicious for malignancy. Images were processed with CAD. IMPRESSION: No mammographic evidence of malignancy. A result letter of this screening mammogram will be mailed directly to the patient. RECOMMENDATION: Screening mammogram in one year. (Code:SM-B-01Y) BI-RADS CATEGORY  1: Negative. Electronically Signed   By: Margarette Canada M.D.   On: 11/14/2019 14:12   DG MINI C-ARM IMAGE ONLY  Result Date: 11/19/2019 There is no interpretation for this exam.  This order is for images obtained during a surgical procedure.  Please See "Surgeries" Tab for more information regarding the procedure.    They benefited maximally from their hospital stay and there were no complications.     Disposition: Discharge disposition: 01-Home or Self Care      Discharge Instructions    Call MD / Call 911   Complete by: As directed    If you experience chest pain or shortness of breath, CALL 911 and be transported to the hospital emergency room.  If you develope a fever above 101 F, pus (white drainage) or increased drainage or redness at the wound, or calf pain, call your surgeon's office.   Constipation Prevention   Complete by: As directed    Drink plenty of fluids.  Prune juice may be helpful.  You may use a stool softener, such as Colace (over the counter) 100 mg twice a day.  Use MiraLax (over the counter) for constipation as needed.   Diet - low sodium heart healthy   Complete by: As directed    Increase activity  slowly as tolerated   Complete  by: As directed       Follow-up Information    Roseanne Kaufman, MD Follow up in 14 day(s).   Specialty: Orthopedic Surgery Why: We will call to see you in 14 days Contact information: [address] [place] [address] [phone]               Status post reconstruction right radius ulna and carpal tunnel release.  Patient is doing very well today.  No signs of DVT infection dystrophy or vascular compromise.  No signs of UTI.  She will be discharged home.  She has oxycodone Zofran and antibiotics at home.  She will notify me same problems occur.  She has my cell phone for any issues.  I will see her in 14 days.  Overall she looks surprisingly well given the severity of her injury process.  We are pleased with her findings and she is anxious to go home. Signed: Satira Anis Lowery Paullin III 11/20/2019, 3:51 PM

## 2019-11-20 NOTE — Plan of Care (Signed)

## 2019-11-20 NOTE — Progress Notes (Signed)
PT Cancellation Note  Patient Details Name: Patricia [name] MRN: [id number] DOB: 07/22/1950   Cancelled Treatment:    Reason Eval/Treat Not Completed: PT screened, no needs identified, will sign off Per OT, patient has no apparent balance or mobility deficits. PT will sign off at this time. Re-order if needed.  Perrin Maltese, PT, DPT Acute Rehabilitation Services Pager [phone] Office [phone]    [name] 11/20/2019, 9:24 AM

## 2019-11-22 ENCOUNTER — Encounter (HOSPITAL_COMMUNITY): Payer: Self-pay | Admitting: Orthopedic Surgery

## 2019-11-23 ENCOUNTER — Encounter (HOSPITAL_COMMUNITY): Payer: Self-pay | Admitting: Orthopedic Surgery

## 2019-11-27 ENCOUNTER — Telehealth: Payer: Self-pay | Admitting: Cardiology

## 2019-11-27 DIAGNOSIS — R002 Palpitations: Secondary | ICD-10-CM

## 2019-11-27 NOTE — Telephone Encounter (Signed)
Pt advised that I am not sure that virtual visit will be agreeable to MD. Informed that I will discuss virtual vs in-person w/ Dr. Curt Bears and let her know next week. (PRN f/u w/ [facility] as of 08/2018) She does state that she is having pain control issues and that may be the problem. She is going to reach out to surgeons office to discuss pain med/control. We agreed to speak next week

## 2019-11-27 NOTE — Telephone Encounter (Signed)
Patient c/o Palpitations:  High priority if patient c/o lightheadedness, shortness of breath, or chest pain  1) How long have you had palpitations/irregular HR/ Afib? Are you having the symptoms now? Since yesterday afternoon  2) Are you currently experiencing lightheadedness, SOB or CP? Just lightheaded  3) Do you have a history of afib (atrial fibrillation) or irregular heart rhythm? yes  4) Have you checked your BP or HR? (document readings if available):     Highest HR: 199  Lowest HR: 90  Highest BP: 165/106  Lowest BP: 114/98  5) Are you experiencing any other symptoms? Shaky hands   Pt said she had two spells since yesterday. The first spell started about 3:00 pm yesterday and lasted for about 2 hours. The second spell was this morning and it started around 10:00 am and also lasted two hours.   The patient had this happen in the past but never two days in a row.  She did have a few spells of tachycardia before she had her Arm surgery about a week ago  She wanted to do a virtual visit with Dr. Curt Bears or an APP. She just had surgery on her Arm a week ago and does not want to come in if she doesn't have to

## 2019-11-30 DIAGNOSIS — S52531A Colles' fracture of right radius, initial encounter for closed fracture: Secondary | ICD-10-CM | POA: Diagnosis not present

## 2019-11-30 DIAGNOSIS — Z4789 Encounter for other orthopedic aftercare: Secondary | ICD-10-CM | POA: Diagnosis not present

## 2019-11-30 NOTE — Telephone Encounter (Signed)
Patricia [name] is calling back stating if Sherri calls her back today her best contact number is her cell. Please advise.

## 2019-12-01 ENCOUNTER — Encounter: Payer: Self-pay | Admitting: *Deleted

## 2019-12-01 NOTE — Progress Notes (Signed)
Patient ID: Patricia [name], female   DOB: 21-Sep-1950, 69 y.o.   MRN: [id number] Patient enrolled for [employer] to ship a 14 day ZIO XT long term holter monitor to her home.

## 2019-12-01 NOTE — Telephone Encounter (Signed)
Pt aware Dr. Curt Bears recommends a monitor to see what the palpitations are that she is feeling. She is agreeable to this plan and appreciates our consideration d/t her current recovery post arm surgery. She does report that she got some pain control, feeling better but still experiencing some palpitations. Will order 2 week monitor, pt aware it will be mailed to her home address (verified) and we will be in touch once received/reviewed.  She is aware it may be up to 2 weeks post mailing monitor back before she hears from the office w/ result.  Will determine follow up, if needed, once result reviewed. Patient verbalized understanding and agreeable to plan.

## 2019-12-05 ENCOUNTER — Encounter (HOSPITAL_COMMUNITY): Payer: Self-pay | Admitting: *Deleted

## 2019-12-05 ENCOUNTER — Ambulatory Visit (HOSPITAL_COMMUNITY)
Admission: EM | Admit: 2019-12-05 | Discharge: 2019-12-05 | Disposition: A | Discharge status: Home / Self Care | Payer: Medicare HMO | Attending: Family Medicine | Admitting: Family Medicine

## 2019-12-05 ENCOUNTER — Other Ambulatory Visit: Payer: Self-pay

## 2019-12-05 DIAGNOSIS — Z9889 Other specified postprocedural states: Secondary | ICD-10-CM

## 2019-12-05 DIAGNOSIS — M79601 Pain in right arm: Secondary | ICD-10-CM

## 2019-12-05 DIAGNOSIS — Z8781 Personal history of (healed) traumatic fracture: Secondary | ICD-10-CM | POA: Diagnosis not present

## 2019-12-05 DIAGNOSIS — S52531A Colles' fracture of right radius, initial encounter for closed fracture: Secondary | ICD-10-CM | POA: Diagnosis not present

## 2019-12-05 NOTE — ED Triage Notes (Signed)
Pt presents with cast problem. Pt reports the cast on rt arm slide down toward elbow and is causing pain to Small finger and thumb . Pt moves all fingers on rt hand.

## 2019-12-05 NOTE — ED Notes (Signed)
ortho tech in  Room with Pt.

## 2019-12-05 NOTE — Progress Notes (Signed)
Orthopedic Tech Progress Note Patient Details:  Patricia [name] August 31, 1950 [id number] Was asked to remove and reapply. Once cast was off I had MD/NP to come and look at skin and then I reapplied the new cast. Casting Type of Cast: Long arm cast Cast Location: RUE Cast Material: Fiberglass Cast Intervention: Removal, Re-application  Post Interventions Patient Tolerated: Well Instructions Provided: Care of device     Janit Pagan 12/05/2019, 4:26 PM

## 2019-12-05 NOTE — ED Provider Notes (Signed)
[facility]    CSN: [id number] Arrival date & time: 12/05/19  1322      History   Chief Complaint Chief Complaint  Patient presents with  . Cast Problem    HPI Patricia [name] is a 69 y.o. female.   Reports that she had radial and ulnar reconstructive surgery as well as carpal tunnel release.  Reports that she is experiencing pain and that her cast has slidden into the wrong place from its original position. States this is putting pressure on her incision and causing pain. Has already had initial post op visit with the surgeon. Reports that she is taking prescribed pain medication with no relief at this point. Reports that she called her surgeon and they recommended that she go to an urgent care to have her cast replaced.  ROS per HPI   The history is provided by the patient.    Past Medical History:  Diagnosis Date  . Anxiety   . GERD (gastroesophageal reflux disease)   . Hyperlipidemia   . Hypertension   . IBS (irritable bowel syndrome)   . Insomnia   . Low back pain   . TIA (transient ischemic attack) 10/2017   no deficits  . Vitamin B12 deficiency   . Vitamin D deficiency     Patient Active Problem List   Diagnosis Date Noted  . Colles' fracture of right radius, initial encounter for closed fracture 11/19/2019    Past Surgical History:  Procedure Laterality Date  . BACK SURGERY    . BREAST EXCISIONAL BIOPSY Left 1997   benign  . hemorrhoidecotmy    . HERNIA MESH REMOVAL    . OPEN REDUCTION INTERNAL FIXATION (ORIF) DISTAL RADIAL FRACTURE Right 11/19/2019   Procedure: OPEN REDUCTION INTERNAL FIXATION (ORIF) DISTAL RADIUS AND ULNA FRACTURE WITH REPAIR AS NECESSARY;  Surgeon: Roseanne Kaufman, MD;  Location: [facility];  Service: Orthopedics;  Laterality: Right;  2 hrs Block with IV sedation  . ORIF RADIUS & ULNA FRACTURES    . TONSILLECTOMY      OB History   No obstetric history on file.      Home Medications    Prior to Admission  medications   Medication Sig Start Date End Date Taking? Authorizing Provider  Black Cohosh (REMIFEMIN PO) Take 1 tablet by mouth in the morning and at bedtime.   Yes [provider]  carboxymethylcellulose (REFRESH PLUS) 0.5 % SOLN Place 1 drop into both eyes 3 (three) times daily as needed (for dryness).    Yes [provider]  carisoprodol (SOMA) 350 MG tablet Take 350 mg by mouth 3 (three) times daily.  09/26/17  Yes [provider]  Cholecalciferol (VITAMIN D3) 50 MCG (2000 UT) TABS Take 2,000 Units by mouth daily.   Yes [provider]  clobetasol ointment (TEMOVATE) 6.71 % Apply 1 application topically See admin instructions. Apply to vaginal area daily as directed    Yes [provider]  clonazePAM (KLONOPIN) 1 MG tablet Take 1 mg by mouth at bedtime.  10/15/17  Yes [provider]  clopidogrel (PLAVIX) 75 MG tablet Take 75 mg by mouth daily.   Yes [provider]  fluticasone (FLONASE) 50 MCG/ACT nasal spray Place 2 sprays into both nostrils daily as needed for allergies or rhinitis.  10/11/17  Yes [provider]  gabapentin (NEURONTIN) 600 MG tablet Take 600 mg by mouth See admin instructions. Take 600 mg by mouth in the morning and at lunchtime 09/16/17  Yes [provider]  hydrOXYzine (ATARAX/VISTARIL) 10 MG tablet Take 10 mg by mouth at bedtime as needed (for sleep).   Yes [provider]  LINZESS 290 MCG CAPS capsule Take 290 mcg by mouth daily at 4 PM.  09/13/17  Yes [provider]  losartan (COZAAR) 100 MG tablet Take 100 mg by mouth daily.  08/27/17  Yes [provider]  omeprazole (PRILOSEC) 20 MG capsule Take 20 mg by mouth daily before breakfast.  08/27/17  Yes [provider]  ondansetron (ZOFRAN) 4 MG tablet Take 1 tablet (4 mg total) by mouth every 8 (eight) hours as needed for nausea or vomiting. 11/17/19 11/16/20 Yes Roseanne Kaufman, MD  oxyCODONE (ROXICODONE) 5 MG  immediate release tablet Take 1 tablet (5 mg total) by mouth every 4 (four) hours as needed. 11/17/19 11/16/20 Yes Roseanne Kaufman, MD  polyethylene glycol (MIRALAX / GLYCOLAX) packet Take 34 g by mouth in the morning.    Yes [provider]  rosuvastatin (CRESTOR) 5 MG tablet TAKE 1 TABLET(5 MG) BY MOUTH DAILY Patient taking differently: Take 5 mg by mouth daily.  06/23/19  Yes McCue, Janett Billow, NP  traMADol (ULTRAM) 50 MG tablet Take 50-100 mg by mouth See admin instructions. Take 100 mg by mouth in the morning and 50 mg at lunchtime 10/11/17  Yes [provider]  VENTOLIN HFA 108 (90 Base) MCG/ACT inhaler Inhale 2 puffs into the lungs every 6 (six) hours as needed for wheezing or shortness of breath.  09/30/17  Yes [provider]  zaleplon (SONATA) 10 MG capsule Take 10 mg by mouth at bedtime as needed (for interrupted sleep).    Yes [provider]    Family History Family History  Problem Relation Age of Onset  . Breast cancer Sister   . Breast cancer Maternal Aunt   . Breast cancer Paternal Aunt     Social History Social History   Tobacco Use  . Smoking status: Current Every Day Smoker    Packs/day: 1.00    Years: 50.00    Pack years: 50.00    Types: Cigarettes  . Smokeless tobacco: Never Used  Vaping Use  . Vaping Use: Never used  Substance Use Topics  . Alcohol use: Yes    Comment: occ once every 2 months  . Drug use: Never     Allergies   Augmentin [amoxicillin-pot clavulanate], Levaquin [levofloxacin in d5w], Mom [magnesium hydroxide], and Oxycodone   Review of Systems Review of Systems   Physical Exam Triage Vital Signs ED Triage Vitals  Enc Vitals Group     BP 12/05/19 1507 (!) 163/100     Pulse Rate 12/05/19 1507 (!) 8     Resp 12/05/19 1507 20     Temp 12/05/19 1507 97.9 F (36.6 C)     Temp Source 12/05/19 1507 Oral     SpO2 12/05/19 1507 95 %     Weight 12/05/19 1503 172 lb (78 kg)     Height 12/05/19 1503 5' 7.5"  (1.715 m)     Head Circumference --      Peak Flow --      Pain Score 12/05/19 1502 8     Pain Loc --      Pain Edu? --      Excl. in [place]? --    No data found.  Updated Vital Signs BP (!) 163/100 (BP Location: Left Arm)   Pulse (!) 8   Temp 97.9 F (36.6 C) (Oral)  Resp 20   Ht 5' 7.5" (1.715 m)   Wt 172 lb (78 kg)   LMP  (LMP Unknown)   SpO2 95%   BMI 26.54 kg/m       Physical Exam Vitals and nursing note reviewed.  Constitutional:      General: She is not in acute distress.    Appearance: She is well-developed.  HENT:     Head: Normocephalic and atraumatic.  Eyes:     Conjunctiva/sclera: Conjunctivae normal.  Cardiovascular:     Rate and Rhythm: Normal rate and regular rhythm.     Heart sounds: No murmur heard.   Pulmonary:     Effort: Pulmonary effort is normal. No respiratory distress.     Breath sounds: Normal breath sounds.  Abdominal:     Palpations: Abdomen is soft.     Tenderness: There is no abdominal tenderness.  Musculoskeletal:        General: Swelling and tenderness present.     Cervical back: Neck supple.  Skin:    General: Skin is warm and dry.     Capillary Refill: Capillary refill takes less than 2 seconds.  Neurological:     General: No focal deficit present.     Mental Status: She is alert.  Psychiatric:        Mood and Affect: Mood normal.        Behavior: Behavior normal.        Thought Content: Thought content normal.           UC Treatments / Results  Labs (all labs ordered are listed, but only abnormal results are displayed) Labs Reviewed - No data to display  EKG   Radiology No results found.  Procedures Procedures (including critical care time)  Medications Ordered in UC Medications - No data to display  Initial Impression / Assessment and Plan / UC Course  I have reviewed the triage vital signs and the nursing notes.  Pertinent labs & imaging results that were available during my care of the patient were  reviewed by me and considered in my medical decision making (see chart for details).     Right arm Pain S/P ORIF Colles' fracture of right radius  Presents with malpositioned cast after open reduction and internal fixation of Colles' fracture of the right radius Has already had her initial postop visit, where the hard cast was originally placed Cast appears to have slid down the patient's arm, as we can no longer see her thumb through the opening in the cast Ortho tech removed all cast and applied sugar tong hard cast in office Patient instructed to follow-up with surgeon Follow-up with the ER if symptoms worsen  Final Clinical Impressions(s) / UC Diagnoses   Final diagnoses:  Right arm pain  S/P ORIF (open reduction internal fixation) fracture  Colles' fracture of right radius, initial encounter for closed fracture     Discharge Instructions     Follow up with surgeon as scheduled  If symptoms worsen, follow up in the ER    ED Prescriptions    None     PDMP not reviewed this encounter.   Faustino Congress, NP 12/08/19 1037

## 2019-12-05 NOTE — Discharge Instructions (Signed)
Follow up with surgeon as scheduled  If symptoms worsen, follow up in the ER

## 2019-12-07 DIAGNOSIS — M25631 Stiffness of right wrist, not elsewhere classified: Secondary | ICD-10-CM | POA: Diagnosis not present

## 2019-12-08 ENCOUNTER — Ambulatory Visit (INDEPENDENT_AMBULATORY_CARE_PROVIDER_SITE_OTHER): Payer: Medicare HMO

## 2019-12-08 DIAGNOSIS — R002 Palpitations: Secondary | ICD-10-CM | POA: Diagnosis not present

## 2019-12-14 ENCOUNTER — Encounter (HOSPITAL_COMMUNITY): Payer: Self-pay

## 2019-12-14 ENCOUNTER — Emergency Department (HOSPITAL_COMMUNITY)
Admission: EM | Admit: 2019-12-14 | Discharge: 2019-12-14 | Disposition: A | Discharge status: Home / Self Care | Payer: Medicare HMO | Attending: Emergency Medicine | Admitting: Emergency Medicine

## 2019-12-14 ENCOUNTER — Emergency Department (HOSPITAL_COMMUNITY): Payer: Medicare HMO

## 2019-12-14 DIAGNOSIS — R002 Palpitations: Secondary | ICD-10-CM | POA: Diagnosis not present

## 2019-12-14 DIAGNOSIS — Z7901 Long term (current) use of anticoagulants: Secondary | ICD-10-CM | POA: Insufficient documentation

## 2019-12-14 DIAGNOSIS — I10 Essential (primary) hypertension: Secondary | ICD-10-CM | POA: Diagnosis not present

## 2019-12-14 DIAGNOSIS — F17210 Nicotine dependence, cigarettes, uncomplicated: Secondary | ICD-10-CM | POA: Diagnosis not present

## 2019-12-14 DIAGNOSIS — M7989 Other specified soft tissue disorders: Secondary | ICD-10-CM | POA: Diagnosis not present

## 2019-12-14 DIAGNOSIS — Z79899 Other long term (current) drug therapy: Secondary | ICD-10-CM | POA: Insufficient documentation

## 2019-12-14 DIAGNOSIS — I471 Supraventricular tachycardia, unspecified: Secondary | ICD-10-CM

## 2019-12-14 DIAGNOSIS — R42 Dizziness and giddiness: Secondary | ICD-10-CM | POA: Diagnosis not present

## 2019-12-14 DIAGNOSIS — R202 Paresthesia of skin: Secondary | ICD-10-CM | POA: Diagnosis not present

## 2019-12-14 DIAGNOSIS — R55 Syncope and collapse: Secondary | ICD-10-CM | POA: Diagnosis not present

## 2019-12-14 DIAGNOSIS — S52501A Unspecified fracture of the lower end of right radius, initial encounter for closed fracture: Secondary | ICD-10-CM | POA: Diagnosis not present

## 2019-12-14 DIAGNOSIS — S52601A Unspecified fracture of lower end of right ulna, initial encounter for closed fracture: Secondary | ICD-10-CM | POA: Diagnosis not present

## 2019-12-14 LAB — COMPREHENSIVE METABOLIC PANEL
ALT: 15 U/L (ref 0–44)
AST: 18 U/L (ref 15–41)
Albumin: 3.6 g/dL (ref 3.5–5.0)
Alkaline Phosphatase: 72 U/L (ref 38–126)
Anion gap: 9 (ref 5–15)
BUN: 7 mg/dL — ABNORMAL LOW (ref 8–23)
CO2: 26 mmol/L (ref 22–32)
Calcium: 8.9 mg/dL (ref 8.9–10.3)
Chloride: 98 mmol/L (ref 98–111)
Creatinine, Ser: 0.57 mg/dL (ref 0.44–1.00)
GFR, Estimated: >60 mL/min (ref >60)
Glucose, Bld: 99 mg/dL (ref 70–99)
Potassium: 4 mmol/L (ref 3.5–5.1)
Sodium: 133 mmol/L — ABNORMAL LOW (ref 135–145)
Total Bilirubin: 0.5 mg/dL (ref 0.3–1.2)
Total Protein: 6.1 g/dL — ABNORMAL LOW (ref 6.5–8.1)

## 2019-12-14 LAB — CBC
HCT: 43.1 % (ref 36.0–46.0)
Hemoglobin: 15.1 g/dL — ABNORMAL HIGH (ref 12.0–15.0)
MCH: 36.1 pg — ABNORMAL HIGH (ref 26.0–34.0)
MCHC: 35 g/dL (ref 30.0–36.0)
MCV: 103.1 fL — ABNORMAL HIGH (ref 80.0–100.0)
Platelets: 255 10*3/uL (ref 150–400)
RBC: 4.18 MIL/uL (ref 3.87–5.11)
RDW: 14.2 % (ref 11.5–15.5)
WBC: 6.6 10*3/uL (ref 4.0–10.5)
nRBC: 0 % (ref 0.0–0.2)

## 2019-12-14 LAB — MAGNESIUM: Magnesium: 1.9 mg/dL (ref 1.7–2.4)

## 2019-12-14 MED ORDER — METOPROLOL TARTRATE 25 MG PO TABS: 12.5000 mg | ORAL_TABLET | Freq: Two times a day (BID) | ORAL | 1 refills | Status: DC

## 2019-12-14 MED ORDER — METOPROLOL TARTRATE 25 MG PO TABS
12.5000 mg | ORAL_TABLET | Freq: Once | ORAL | Status: AC
  Administered 2019-12-14: 12.5 mg via ORAL
  Filled 2019-12-14: qty 1

## 2019-12-14 NOTE — ED Triage Notes (Signed)
Pt arrived via ems with complaints of SVT. Pt has been diagnosed with SVT. Pt has had a syncopal episode and fell and injured her right arm. Pt denies LOC, did not hit head, and is not on blood thinners. Pt reports having palpitations, tingling of fingers, and feeling light headed during her episodes of SVT. Pt has a heart monitor to monitor SVT, EMS gave pt 250 ml NS and 6 mg of adenosine, which caused the pt to return to NSR. Pt denies sob, chest pain. EMS also reports pt has occasional PAC's during her episodes of SVT.

## 2019-12-14 NOTE — ED Notes (Signed)
Patient verbalized understanding of discharge instructions. Opportunity for questions and answers.  

## 2019-12-14 NOTE — ED Provider Notes (Signed)
[facility] EMERGENCY DEPARTMENT Provider Note   CSN: [id number] Arrival date & time: 12/14/19  1323     History Chief Complaint  Patient presents with  . Abnormal ECG    Patricia [name] is a 69 y.o. female.  HPI   This patient is a 69 year old female, she has a known history of B12 deficiency, history of TIA, hypertension and hyperlipidemia.  She reports approximately 10 episodes over the last year where she has had palpitations most of the time finishing or ending when she applies a cold rag to her face.  In fact she fell and broke her forearm couple of weeks ago and ended up having to have a repair, she had several episodes before the repair that all fixed with vagal maneuvers.  She has never had medications for it.  She has been wearing a chest attached heart monitor for the last couple of weeks after having a virtual visit with a cardiologist.  This morning while she was in her usual state of health sitting in a recliner she felt the acute onset of palpitations that occurred while she was sitting.  This lasted until the paramedics arrived and gave her 6 mg of adenosine.  This fixed her acutely.  SVT was seen on the prehospital cardiac monitoring strip, after the medication she was in normal sinus rhythm.  The patient has no complaints at this time and is feeling totally back to normal.  She is still smoke cigarettes, does not drink alcohol or use hard drugs, she does not take any other over-the-counter medications recently.  No increase in caffeine.  She has never had a heart attack.  Does not take any anticoagulants but is on antiplatelet and clopidogrel.  Past Medical History:  Diagnosis Date  . Anxiety   . GERD (gastroesophageal reflux disease)   . Hyperlipidemia   . Hypertension   . IBS (irritable bowel syndrome)   . Insomnia   . Low back pain   . TIA (transient ischemic attack) 10/2017   no deficits  . Vitamin B12 deficiency   . Vitamin D deficiency       Patient Active Problem List   Diagnosis Date Noted  . Colles' fracture of right radius, initial encounter for closed fracture 11/19/2019    Past Surgical History:  Procedure Laterality Date  . BACK SURGERY    . BREAST EXCISIONAL BIOPSY Left 1997   benign  . hemorrhoidecotmy    . HERNIA MESH REMOVAL    . OPEN REDUCTION INTERNAL FIXATION (ORIF) DISTAL RADIAL FRACTURE Right 11/19/2019   Procedure: OPEN REDUCTION INTERNAL FIXATION (ORIF) DISTAL RADIUS AND ULNA FRACTURE WITH REPAIR AS NECESSARY;  Surgeon: Roseanne Kaufman, MD;  Location: [facility];  Service: Orthopedics;  Laterality: Right;  2 hrs Block with IV sedation  . ORIF RADIUS & ULNA FRACTURES    . TONSILLECTOMY       OB History   No obstetric history on file.     Family History  Problem Relation Age of Onset  . Breast cancer Sister   . Breast cancer Maternal Aunt   . Breast cancer Paternal Aunt     Social History   Tobacco Use  . Smoking status: Current Every Day Smoker    Packs/day: 1.00    Years: 50.00    Pack years: 50.00    Types: Cigarettes  . Smokeless tobacco: Never Used  Vaping Use  . Vaping Use: Never used  Substance Use Topics  . Alcohol use: Yes  Comment: occ once every 2 months  . Drug use: Never    Home Medications Prior to Admission medications   Medication Sig Start Date End Date Taking? Authorizing Provider  Black Cohosh (REMIFEMIN PO) Take 1 tablet by mouth in the morning and at bedtime.    [provider]  carboxymethylcellulose (REFRESH PLUS) 0.5 % SOLN Place 1 drop into both eyes 3 (three) times daily as needed (for dryness).     [provider]  carisoprodol (SOMA) 350 MG tablet Take 350 mg by mouth 3 (three) times daily.  09/26/17   [provider]  Cholecalciferol (VITAMIN D3) 50 MCG (2000 UT) TABS Take 2,000 Units by mouth daily.    [provider]  clobetasol ointment (TEMOVATE) 2.42 % Apply 1 application topically See admin instructions. Apply  to vaginal area daily as directed     [provider]  clonazePAM (KLONOPIN) 1 MG tablet Take 1 mg by mouth at bedtime.  10/15/17   [provider]  clopidogrel (PLAVIX) 75 MG tablet Take 75 mg by mouth daily.    [provider]  fluticasone (FLONASE) 50 MCG/ACT nasal spray Place 2 sprays into both nostrils daily as needed for allergies or rhinitis.  10/11/17   [provider]  gabapentin (NEURONTIN) 600 MG tablet Take 600 mg by mouth See admin instructions. Take 600 mg by mouth in the morning and at lunchtime 09/16/17   [provider]  hydrOXYzine (ATARAX/VISTARIL) 10 MG tablet Take 10 mg by mouth at bedtime as needed (for sleep).    [provider]  LINZESS 290 MCG CAPS capsule Take 290 mcg by mouth daily at 4 PM.  09/13/17   [provider]  losartan (COZAAR) 100 MG tablet Take 100 mg by mouth daily.  08/27/17   [provider]  metoprolol tartrate (LOPRESSOR) 25 MG tablet Take 0.5 tablets (12.5 mg total) by mouth 2 (two) times daily. 12/14/19 01/13/20  Patricia Chapel, MD  omeprazole (PRILOSEC) 20 MG capsule Take 20 mg by mouth daily before breakfast.  08/27/17   [provider]  ondansetron (ZOFRAN) 4 MG tablet Take 1 tablet (4 mg total) by mouth every 8 (eight) hours as needed for nausea or vomiting. 11/17/19 11/16/20  Roseanne Kaufman, MD  oxyCODONE (ROXICODONE) 5 MG immediate release tablet Take 1 tablet (5 mg total) by mouth every 4 (four) hours as needed. 11/17/19 11/16/20  Roseanne Kaufman, MD  polyethylene glycol (MIRALAX / Floria Raveling) packet Take 34 g by mouth in the morning.     [provider]  rosuvastatin (CRESTOR) 5 MG tablet TAKE 1 TABLET(5 MG) BY MOUTH DAILY Patient taking differently: Take 5 mg by mouth daily.  06/23/19   Frann Rider, NP  traMADol (ULTRAM) 50 MG tablet Take 50-100 mg by mouth See admin instructions. Take 100 mg by mouth in the morning and 50 mg at lunchtime 10/11/17   [provider]   VENTOLIN HFA 108 (90 Base) MCG/ACT inhaler Inhale 2 puffs into the lungs every 6 (six) hours as needed for wheezing or shortness of breath.  09/30/17   [provider]  zaleplon (SONATA) 10 MG capsule Take 10 mg by mouth at bedtime as needed (for interrupted sleep).     [provider]    Allergies    Augmentin [amoxicillin-pot clavulanate], Levaquin [levofloxacin in d5w], Mom [magnesium hydroxide], and Oxycodone  Review of Systems   Review of Systems  All other systems reviewed and are negative.   Physical Exam Updated  Vital Signs BP 135/78   Pulse 70   Temp 98.1 F (36.7 C) (Oral)   Resp 17   LMP  (LMP Unknown)   SpO2 97%   Physical Exam Vitals and nursing note reviewed.  Constitutional:      General: She is not in acute distress.    Appearance: She is well-developed.  HENT:     Head: Normocephalic and atraumatic.     Mouth/Throat:     Pharynx: No oropharyngeal exudate.  Eyes:     General: No scleral icterus.       Right eye: No discharge.        Left eye: No discharge.     Conjunctiva/sclera: Conjunctivae normal.     Pupils: Pupils are equal, round, and reactive to light.  Neck:     Thyroid: No thyromegaly.     Vascular: No JVD.  Cardiovascular:     Rate and Rhythm: Normal rate and regular rhythm.     Heart sounds: Normal heart sounds. No murmur heard.  No friction rub. No gallop.   Pulmonary:     Effort: Pulmonary effort is normal. No respiratory distress.     Breath sounds: Normal breath sounds. No wheezing or rales.  Abdominal:     General: Bowel sounds are normal. There is no distension.     Palpations: Abdomen is soft. There is no mass.     Tenderness: There is no abdominal tenderness.  Musculoskeletal:        General: No tenderness. Normal range of motion.     Cervical back: Normal range of motion and neck supple.  Lymphadenopathy:     Cervical: No cervical adenopathy.  Skin:    General: Skin is warm and dry.     Findings: No  erythema or rash.  Neurological:     Mental Status: She is alert.     Coordination: Coordination normal.  Psychiatric:        Behavior: Behavior normal.     ED Results / Procedures / Treatments   Labs (all labs ordered are listed, but only abnormal results are displayed) Labs Reviewed  COMPREHENSIVE METABOLIC PANEL - Abnormal; Notable for the following components:      Result Value   Sodium 133 (*)    BUN 7 (*)    Total Protein 6.1 (*)    All other components within normal limits  CBC - Abnormal; Notable for the following components:   Hemoglobin 15.1 (*)    MCV 103.1 (*)    MCH 36.1 (*)    All other components within normal limits  MAGNESIUM    EKG EKG Interpretation  Date/Time:  Monday December 14 2019 13:36:53 EST Ventricular Rate:  85 PR Interval:    QRS Duration: 77 QT Interval:  354 QTC Calculation: 421 R Axis:   70 Text Interpretation: Sinus rhythm Normal ECG since last tracing no significant change Confirmed by Patricia [name] [phone] on 12/14/2019 2:20:40 PM   Radiology DG Forearm Right  Result Date: 12/14/2019 CLINICAL DATA:  Right forearm pain.  Recent fracture with ORIF. EXAM: RIGHT FOREARM - 2 VIEW COMPARISON:  Radiographs 11/17/2019 FINDINGS: Interval open reduction and internal fixation of the distal radial and ulnar fractures with plates and screws. The hardware appears intact and well positioned. There is improved alignment of the fractures. The ulnar styloid remains mildly displaced. No new fractures or bone destruction. Mild distal forearm soft tissue swelling noted. IMPRESSION: Improved alignment of distal radial and ulnar fractures status post ORIF. No  acute findings. Electronically Signed   By: Richardean Sale M.D.   On: 12/14/2019 14:45   DG Chest Port 1 View  Result Date: 12/14/2019 CLINICAL DATA:  Arrhythmia, syncopal episode EXAM: PORTABLE CHEST 1 VIEW COMPARISON:  December 11, 2011 FINDINGS: Device projects over the LEFT chest, of uncertain  significance. Perhaps related to cardiac monitor of some sort. Cardiomediastinal contours and hilar structures are normal. Lungs are clear. No effusion. On limited assessment no acute skeletal process. IMPRESSION: 1. No acute cardiopulmonary disease. Electronically Signed   By: Zetta Bills M.D.   On: 12/14/2019 14:33    Procedures Procedures (including critical care time)  Medications Ordered in ED Medications  metoprolol tartrate (LOPRESSOR) tablet 12.5 mg (12.5 mg Oral Given 12/14/19 1345)    ED Course  I have reviewed the triage vital signs and the nursing notes.  Pertinent labs & imaging results that were available during my care of the patient were reviewed by me and considered in my medical decision making (see chart for details).    MDM Rules/Calculators/A&P                          This patient is in normal sinus rhythm, she appears medically stable, it sounds like she is having paroxysmal SVT.  At this time we will treat this patient with a low-dose beta-blocker and consult with cardiology once labs are back.  The patient is agreeable to the plan.  She appears very stable at this time.    Discussed with Dr. Caryl Comes of the cardiology service recommends low-dose metoprolol twice a day and follow-up with cardiology, patient agreeable, no recurrent SVT, stable for discharge at this time  Final Clinical Impression(s) / ED Diagnoses Final diagnoses:  SVT (supraventricular tachycardia) ([place])    Rx / DC Orders ED Discharge Orders         Ordered    metoprolol tartrate (LOPRESSOR) 25 MG tablet  2 times daily        12/14/19 1531           Patricia Chapel, MD 12/14/19 1540

## 2019-12-14 NOTE — ED Notes (Signed)
Pt broke both bones on her right wrist due to fall. Pt reports increased swelling.

## 2019-12-14 NOTE — Discharge Instructions (Addendum)
Start taking metoprolol twice daily (low dose), this may make your blood pressure a little low -  If you have symptoms like light headedness, weakness, passing out or other sympotms stop this medicine and call your doctor  Call the cardiology office to be seen in the next week  ER for worsening symptoms.

## 2019-12-15 DIAGNOSIS — G459 Transient cerebral ischemic attack, unspecified: Secondary | ICD-10-CM | POA: Diagnosis not present

## 2019-12-15 DIAGNOSIS — G4700 Insomnia, unspecified: Secondary | ICD-10-CM | POA: Diagnosis not present

## 2019-12-15 DIAGNOSIS — I10 Essential (primary) hypertension: Secondary | ICD-10-CM | POA: Diagnosis not present

## 2019-12-15 DIAGNOSIS — K219 Gastro-esophageal reflux disease without esophagitis: Secondary | ICD-10-CM | POA: Diagnosis not present

## 2019-12-15 DIAGNOSIS — E7800 Pure hypercholesterolemia, unspecified: Secondary | ICD-10-CM | POA: Diagnosis not present

## 2019-12-17 DIAGNOSIS — Z4789 Encounter for other orthopedic aftercare: Secondary | ICD-10-CM | POA: Diagnosis not present

## 2019-12-17 DIAGNOSIS — M25531 Pain in right wrist: Secondary | ICD-10-CM | POA: Diagnosis not present

## 2019-12-17 DIAGNOSIS — S52531D Colles' fracture of right radius, subsequent encounter for closed fracture with routine healing: Secondary | ICD-10-CM | POA: Diagnosis not present

## 2019-12-22 NOTE — Progress Notes (Addendum)
Cardiology Office Note Date:  12/22/2019  Patient ID:  Patricia [name], Patricia [name] [birth year], MRN [id number] PCP:  [name], MD  Cardiologist/Electrophysiologist: Dr. Curt Bears    Chief Complaint: post ER visit  History of Present Illness: Patricia [name] is a 69 y.o. female with history of HTN, HLD, TIA, IBS, SVT, chronic back pain  She was referred to Dr. Curt Bears June 2020 for an episode of SVT.  She had worn a monitor noting SVT, no AFib (pending final report), planned to discuss case with neurology perhaps consider linq.  The patient wanted to avoid medicines/procedures, was instructed on vagal maneuvers. Final monitor report: Zero atrial fibrillation Sinus rhythm with episodes of paroxysmal SVT Will Camnitz, MD  No further visits noted.  Nov 2021, called with palpitations recommended 2 week monitoring, mentioned issues with pain as well and planned to f/u with her surgeon, thinking perhaps triggered by pain. 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 Date of surgery November 20, 2019)  12/14/19 had an ER visit brought via EMS, reportedly treated by EMS succesfully with adenosine Reported historically vagal maneuvers, cold rag on her face would resolve her palpitations until then. (ECG images noted in ED note) Was wearing long term monitor. She was prescribed lopressor 25mg  BID and discharged from the ER  TODAY She is feeling well outside of her SVT and R wrist. She denies any symptoms of CP, SOB, DOE, She is limited with her are casted currently otherwise denies any limitations or concerns. No dizzy spells, near syncope or syncope. She had not had any SVT since her last visit, thinks her fall, wrist fracture has triggered some palpitations. The day she called EMS was lasting hours, began to feel weak, lightheaded, numb in her hands, feet and was quite  scary. As soon as they stopped it she felt better. She is tolerating the metoprolol and has not had further palpitations. She mailed the monitor back in yesterday  She has been a week without smoking!  Past Medical History:  Diagnosis Date  . Anxiety   . GERD (gastroesophageal reflux disease)   . Hyperlipidemia   . Hypertension   . IBS (irritable bowel syndrome)   . Insomnia   . Low back pain   . TIA (transient ischemic attack) 10/2017   no deficits  . Vitamin B12 deficiency   . Vitamin D deficiency     Past Surgical History:  Procedure Laterality Date  . BACK SURGERY    . BREAST EXCISIONAL BIOPSY Left 1997   benign  . hemorrhoidecotmy    . HERNIA MESH REMOVAL    . OPEN REDUCTION INTERNAL FIXATION (ORIF) DISTAL RADIAL FRACTURE Right 11/19/2019   Procedure: OPEN REDUCTION INTERNAL FIXATION (ORIF) DISTAL RADIUS AND ULNA FRACTURE WITH REPAIR AS NECESSARY;  Surgeon: Roseanne Kaufman, MD;  Location: [facility];  Service: Orthopedics;  Laterality: Right;  2 hrs Block with IV sedation  . ORIF RADIUS & ULNA FRACTURES    . TONSILLECTOMY      Current Outpatient Medications  Medication Sig Dispense Refill  . Black Cohosh (REMIFEMIN PO) Take 1 tablet by mouth in the morning and at bedtime.    . carboxymethylcellulose (REFRESH PLUS) 0.5 % SOLN Place 1 drop into both eyes 3 (three) times daily as needed (for dryness).     . carisoprodol (SOMA) 350 MG tablet Take 350 mg by mouth 3 (three) times daily.  5  . Cholecalciferol (VITAMIN D3) 50 MCG (2000 UT) TABS Take 2,000 Units by mouth daily.    . clobetasol ointment (TEMOVATE) 8.10 % Apply 1 application topically See admin instructions. Apply to vaginal area daily as directed     . clonazePAM (KLONOPIN) 1 MG tablet Take 1 mg by mouth at bedtime.   3  . clopidogrel (PLAVIX) 75 MG tablet Take 75 mg by mouth daily.    . fluticasone (FLONASE) 50 MCG/ACT nasal spray Place 2 sprays into both nostrils daily as needed for allergies or rhinitis.   5   . gabapentin (NEURONTIN) 600 MG tablet Take 600 mg by mouth See admin instructions. Take 600 mg by mouth in the morning and at lunchtime    . hydrOXYzine (ATARAX/VISTARIL) 10 MG tablet Take 10 mg by mouth at bedtime as needed (for sleep).    Marland Kitchen LINZESS 290 MCG CAPS capsule Take 290 mcg by mouth daily at 4 PM.   6  . losartan (COZAAR) 100 MG tablet Take 100 mg by mouth daily.     . metoprolol tartrate (LOPRESSOR) 25 MG tablet Take 0.5 tablets (12.5 mg total) by mouth 2 (two) times daily. 60 tablet 1  . omeprazole (PRILOSEC) 20 MG capsule Take 20 mg by mouth daily before breakfast.     . ondansetron (ZOFRAN) 4 MG tablet Take 1 tablet (4 mg total) by mouth every 8 (eight) hours as needed for nausea or vomiting. 30 tablet 1  . oxyCODONE (ROXICODONE) 5 MG immediate release tablet Take 1 tablet (5 mg total) by mouth every 4 (four) hours as needed. 40 tablet 0  . polyethylene glycol (MIRALAX / GLYCOLAX) packet Take 34 g by mouth in the morning.     . rosuvastatin (CRESTOR) 5 MG tablet TAKE 1 TABLET(5 MG) BY MOUTH DAILY (Patient taking differently: Take 5 mg by mouth daily. ) 30 tablet 0  . traMADol (ULTRAM) 50 MG tablet Take 50-100 mg by mouth See admin instructions. Take 100 mg by mouth in the morning and 50 mg at lunchtime  4  . VENTOLIN HFA 108 (90 Base) MCG/ACT inhaler Inhale 2 puffs into the lungs every 6 (six) hours as needed for wheezing or shortness of breath.   5  . zaleplon (SONATA) 10 MG capsule Take 10 mg by mouth at bedtime as needed (for interrupted sleep).      No current facility-administered medications for this visit.    Allergies:   Augmentin [amoxicillin-pot clavulanate], Levaquin [levofloxacin in d5w], Mom [magnesium hydroxide], and Oxycodone   Social History:  The patient  reports that she has been smoking cigarettes. She has a 50.00 pack-year smoking history. She has never used smokeless tobacco. She reports current alcohol use. She reports that she does not use drugs.   Family  History:  The patient's family history includes Breast cancer in her maternal aunt, paternal aunt, and sister.  ROS:  Please see the history of present illness.    All other systems are reviewed and otherwise negative.   PHYSICAL EXAM:  VS:  LMP  (LMP Unknown)  BMI: There is no height or weight on file to calculate BMI. Well nourished, well developed, in no acute distress HEENT: normocephalic, atraumatic Neck: no JVD, carotid bruits or masses Cardiac:  RRR; no significant murmurs, no rubs, or gallops Lungs:  CTA b/l, no wheezing, rhonchi or rales Abd: soft, nontender MS: no deformity or atrophy Ext: no edema Skin: warm and dry, no rash Neuro:  No gross deficits appreciated  Psych: euthymic mood, full affect     EKG:  Not done today EMS tracings are reviewed 12/14/19; SVT, 168bpm, looks short RP SR 83bpm, some motion artifact   TTE 10/25/17 - Left ventricle: The cavity size was normal. Systolic function was normal. The estimated ejection fraction was in the range of 55% to 60%. Wall motion was normal; there were no regional wall motion abnormalities. Doppler parameters are consistent with abnormal left ventricular relaxation (grade 1 diastolic dysfunction). There was no evidence of elevated ventricular filling pressure by Doppler parameters. - Aortic valve: There was no regurgitation. - Mitral valve: There was trivial regurgitation. - Right ventricle: The cavity size was normal. Wall thickness was normal. Systolic function was normal. - Tricuspid valve: There was mild regurgitation. - Pulmonary arteries: Systolic pressure was within the normal range. - Inferior vena cava: The vessel was normal in size. - Pericardium, extracardiac: There was no pericardial effusion.  Cardiac monitor 06/12/2018  Sinus rhythm with episodes of SVT  Recent Labs: 12/14/2019: ALT 15; BUN 7; Creatinine, Ser 0.57; Hemoglobin 15.1; Magnesium 1.9; Platelets 255; Potassium 4.0; Sodium  133  No results found for requested labs within last 8760 hours.   CrCl cannot be calculated (Unknown ideal weight.).   Wt Readings from Last 3 Encounters:  12/05/19 172 lb (78 kg)  11/19/19 172 lb (78 kg)  11/17/19 172 lb (78 kg)     Other studies reviewed: Additional studies/records reviewed today include: summarized above  ASSESSMENT AND PLAN:  1. SVT     Tolerating metoprolol      Discussed treatment strategies, she would really like to void procedures      Will contriue the metoprolol, BP has room for titration, if more, may consider AAD    2. HTN     No changes today   3. Quit smoking!!     Discussed at length, she is emotional about quitting, she has good support from her husband, and is congratulated, discussed strategies for times of cravings.     She seems very motivated.    Disposition: will check a STH, plan f/u with Dr. Curt Bears in Feb, sooner if needed or recurrent tachycardia   Current medicines are reviewed at length with the patient today.  The patient did not have any concerns regarding medicines.  Venetia Night, PA-C 12/22/2019 5:58 AM     [address] [place] [address] [phone] (office)  [phone] (fax)

## 2019-12-23 ENCOUNTER — Encounter: Payer: Self-pay | Admitting: Physician Assistant

## 2019-12-23 ENCOUNTER — Other Ambulatory Visit: Payer: Self-pay

## 2019-12-23 ENCOUNTER — Ambulatory Visit (INDEPENDENT_AMBULATORY_CARE_PROVIDER_SITE_OTHER): Payer: Medicare HMO | Admitting: Physician Assistant

## 2019-12-23 VITALS — BP 140/80 | HR 74 | Ht 67.5 in | Wt 171.8 lb

## 2019-12-23 DIAGNOSIS — F17200 Nicotine dependence, unspecified, uncomplicated: Secondary | ICD-10-CM | POA: Diagnosis not present

## 2019-12-23 DIAGNOSIS — I471 Supraventricular tachycardia: Secondary | ICD-10-CM | POA: Diagnosis not present

## 2019-12-23 DIAGNOSIS — I10 Essential (primary) hypertension: Secondary | ICD-10-CM

## 2019-12-23 NOTE — Patient Instructions (Signed)
Medication Instructions:   Your physician recommends that you continue on your current medications as directed. Please refer to the Current Medication list given to you today.   *If you need a refill on your cardiac medications before your next appointment, please call your pharmacy*   Lab Work:  TSH TODAY   If you have labs (blood work) drawn today and your tests are completely normal, you will receive your results only by: Marland Kitchen MyChart Message (if you have MyChart) OR . A paper copy in the mail If you have any lab test that is abnormal or we need to change your treatment, we will call you to review the results.   Testing/Procedures: NONE ORDERED  TODAY   Follow-Up: At [facility], you and your health needs are our priority.  As part of our continuing mission to provide you with exceptional heart care, we have created designated Provider Care Teams.  These Care Teams include your primary Cardiologist (physician) and Advanced Practice Providers (APPs -  Physician Assistants and Nurse Practitioners) who all work together to provide you with the care you need, when you need it.  We recommend signing up for the patient portal called "MyChart".  Sign up information is provided on this After Visit Summary.  MyChart is used to connect with patients for Virtual Visits (Telemedicine).  Patients are able to view lab/test results, encounter notes, upcoming appointments, etc.  Non-urgent messages can be sent to your provider as well.   To learn more about what you can do with MyChart, go to [email or web address].    Your next appointment:   2 month(s) (February)  The format for your next appointment:   In Person  Provider:   Allegra Lai, MD   Other Instructions

## 2019-12-24 LAB — TSH: TSH: 1.58 u[IU]/mL (ref 0.450–4.500)

## 2019-12-25 ENCOUNTER — Other Ambulatory Visit: Payer: Self-pay | Admitting: Family Medicine

## 2019-12-25 DIAGNOSIS — E2839 Other primary ovarian failure: Secondary | ICD-10-CM

## 2019-12-25 DIAGNOSIS — S52101D Unspecified fracture of upper end of right radius, subsequent encounter for closed fracture with routine healing: Secondary | ICD-10-CM

## 2019-12-30 ENCOUNTER — Telehealth: Payer: Self-pay | Admitting: Cardiology

## 2019-12-30 DIAGNOSIS — R002 Palpitations: Secondary | ICD-10-CM | POA: Diagnosis not present

## 2019-12-30 NOTE — Telephone Encounter (Signed)
Returned call to [facility] who was calling in final report of 2 week Zio monitor. Reports that monitor showed symptomatic episode of AFLutter rate 195 pbm, lasting for one minute.  (states can be found on strip 10, page 22 of report). Pt had a 2% burden for the 2 week monitoring period. States that company will have final report uploaded to our system by end of day. Will forward note to MD for his FYI and to be on lookout for final report

## 2019-12-30 NOTE — Telephone Encounter (Signed)
Attempted to contact triage 2x with no answer. Please return call to [employer] at [phone] (reference #: [id number]).

## 2019-12-30 NOTE — Telephone Encounter (Signed)
Raquel Sarna with [employer] is calling to report abnormal Zio results.

## 2020-01-04 DIAGNOSIS — M25631 Stiffness of right wrist, not elsewhere classified: Secondary | ICD-10-CM | POA: Diagnosis not present

## 2020-01-07 DIAGNOSIS — M25531 Pain in right wrist: Secondary | ICD-10-CM | POA: Diagnosis not present

## 2020-01-07 DIAGNOSIS — Z4789 Encounter for other orthopedic aftercare: Secondary | ICD-10-CM | POA: Diagnosis not present

## 2020-01-07 DIAGNOSIS — S52531D Colles' fracture of right radius, subsequent encounter for closed fracture with routine healing: Secondary | ICD-10-CM | POA: Diagnosis not present

## 2020-01-13 DIAGNOSIS — M25631 Stiffness of right wrist, not elsewhere classified: Secondary | ICD-10-CM | POA: Diagnosis not present

## 2020-01-21 DIAGNOSIS — M25631 Stiffness of right wrist, not elsewhere classified: Secondary | ICD-10-CM | POA: Diagnosis not present

## 2020-01-28 DIAGNOSIS — Z4789 Encounter for other orthopedic aftercare: Secondary | ICD-10-CM | POA: Diagnosis not present

## 2020-01-28 DIAGNOSIS — S52531D Colles' fracture of right radius, subsequent encounter for closed fracture with routine healing: Secondary | ICD-10-CM | POA: Diagnosis not present

## 2020-02-04 DIAGNOSIS — E7800 Pure hypercholesterolemia, unspecified: Secondary | ICD-10-CM | POA: Diagnosis not present

## 2020-02-04 DIAGNOSIS — I10 Essential (primary) hypertension: Secondary | ICD-10-CM | POA: Diagnosis not present

## 2020-02-04 DIAGNOSIS — K219 Gastro-esophageal reflux disease without esophagitis: Secondary | ICD-10-CM | POA: Diagnosis not present

## 2020-02-04 DIAGNOSIS — G4700 Insomnia, unspecified: Secondary | ICD-10-CM | POA: Diagnosis not present

## 2020-02-04 DIAGNOSIS — G459 Transient cerebral ischemic attack, unspecified: Secondary | ICD-10-CM | POA: Diagnosis not present

## 2020-02-04 DIAGNOSIS — M25631 Stiffness of right wrist, not elsewhere classified: Secondary | ICD-10-CM | POA: Diagnosis not present

## 2020-02-08 DIAGNOSIS — M25631 Stiffness of right wrist, not elsewhere classified: Secondary | ICD-10-CM | POA: Diagnosis not present

## 2020-02-08 DIAGNOSIS — M25641 Stiffness of right hand, not elsewhere classified: Secondary | ICD-10-CM | POA: Diagnosis not present

## 2020-02-10 DIAGNOSIS — M25641 Stiffness of right hand, not elsewhere classified: Secondary | ICD-10-CM | POA: Diagnosis not present

## 2020-02-10 DIAGNOSIS — M25631 Stiffness of right wrist, not elsewhere classified: Secondary | ICD-10-CM | POA: Diagnosis not present

## 2020-02-11 ENCOUNTER — Encounter: Payer: Self-pay | Admitting: Cardiology

## 2020-02-11 ENCOUNTER — Ambulatory Visit: Payer: Medicare HMO | Admitting: Cardiology

## 2020-02-11 ENCOUNTER — Other Ambulatory Visit: Payer: Self-pay

## 2020-02-11 VITALS — BP 108/78 | HR 59 | Ht 67.5 in | Wt 173.6 lb

## 2020-02-11 DIAGNOSIS — I471 Supraventricular tachycardia: Secondary | ICD-10-CM

## 2020-02-11 MED ORDER — APIXABAN 5 MG PO TABS: 5.0000 mg | ORAL_TABLET | Freq: Two times a day (BID) | ORAL | 6 refills | Status: DC

## 2020-02-11 NOTE — Patient Instructions (Addendum)
Medication Instructions:  Your physician has recommended you make the following change in your medication:  1. START Eliquis 5 mg twice a day 2. STOP Plavix  *If you need a refill on your cardiac medications before your next appointment, please call your pharmacy*   Lab Work: None ordered   Testing/Procedures: None ordered   Follow-Up: At [facility], you and your health needs are our priority.  As part of our continuing mission to provide you with exceptional heart care, we have created designated Provider Care Teams.  These Care Teams include your primary Cardiologist (physician) and Advanced Practice Providers (APPs -  Physician Assistants and Nurse Practitioners) who all work together to provide you with the care you need, when you need it.  We recommend signing up for the patient portal called "MyChart".  Sign up information is provided on this After Visit Summary.  MyChart is used to connect with patients for Virtual Visits (Telemedicine).  Patients are able to view lab/test results, encounter notes, upcoming appointments, etc.  Non-urgent messages can be sent to your provider as well.   To learn more about what you can do with MyChart, go to [email or web address].    Your next appointment:   3 month(s)  The format for your next appointment:   In Person  Provider:   Tommye Standard, PA-C or Allegra Lai, MD    Thank you for choosing [facility]!!   Trinidad Curet, RN [phone]   Other Instructions  Apixaban Tablets What is this medicine? APIXABAN (a PIX a ban) is an anticoagulant (blood thinner). It is used to lower the chance of stroke in people with a medical condition called atrial fibrillation. It is also used to treat or prevent blood clots in the lungs or in the veins. This medicine may be used for other purposes; ask your health care provider or pharmacist if you have questions. COMMON BRAND NAME(S): Eliquis What should I tell my health care provider  before I take this medicine? They need to know if you have any of these conditions:  antiphospholipid antibody syndrome  bleeding disorders  bleeding in the brain  blood in your stools (black or tarry stools) or if you have blood in your vomit  history of blood clots  history of stomach bleeding  kidney disease  liver disease  mechanical heart valve  an unusual or allergic reaction to apixaban, other medicines, foods, dyes, or preservatives  pregnant or trying to get pregnant  breast-feeding How should I use this medicine? Take this medicine by mouth with a glass of water. Follow the directions on the prescription label. You can take it with or without food. If it upsets your stomach, take it with food. Take your medicine at regular intervals. Do not take it more often than directed. Do not stop taking except on your doctor's advice. Stopping this medicine may increase your risk of a blood clot. Be sure to refill your prescription before you run out of medicine. Talk to your pediatrician regarding the use of this medicine in children. Special care may be needed. Overdosage: If you think you have taken too much of this medicine contact a poison control center or emergency room at once. NOTE: This medicine is only for you. Do not share this medicine with others. What if I miss a dose? If you miss a dose, take it as soon as you can. If it is almost time for your next dose, take only that dose. Do not take double or  extra doses. What may interact with this medicine? This medicine may interact with the following:  aspirin and aspirin-like medicines  certain medicines for fungal infections like ketoconazole and itraconazole  certain medicines for seizures like carbamazepine and phenytoin  certain medicines that treat or prevent blood clots like warfarin, enoxaparin, and dalteparin  clarithromycin  NSAIDs, medicines for pain and inflammation, like ibuprofen or  naproxen  rifampin  ritonavir  St. John's wort This list may not describe all possible interactions. Give your health care provider a list of all the medicines, herbs, non-prescription drugs, or dietary supplements you use. Also tell them if you smoke, drink alcohol, or use illegal drugs. Some items may interact with your medicine. What should I watch for while using this medicine? Visit your healthcare professional for regular checks on your progress. You may need blood work done while you are taking this medicine. Your condition will be monitored carefully while you are receiving this medicine. It is important not to miss any appointments. Avoid sports and activities that might cause injury while you are using this medicine. Severe falls or injuries can cause unseen bleeding. Be careful when using sharp tools or knives. Consider using an Copy. Take special care brushing or flossing your teeth. Report any injuries, bruising, or red spots on the skin to your healthcare professional. If you are going to need surgery or other procedure, tell your healthcare professional that you are taking this medicine. Wear a medical ID bracelet or chain. Carry a card that describes your disease and details of your medicine and dosage times. What side effects may I notice from receiving this medicine? Side effects that you should report to your doctor or health care professional as soon as possible:  allergic reactions like skin rash, itching or hives, swelling of the face, lips, or tongue  signs and symptoms of bleeding such as bloody or black, tarry stools; red or dark-brown urine; spitting up blood or brown material that looks like coffee grounds; red spots on the skin; unusual bruising or bleeding from the eye, gums, or nose  signs and symptoms of a blood clot such as chest pain; shortness of breath; pain, swelling, or warmth in the leg  signs and symptoms of a stroke such as changes in vision;  confusion; trouble speaking or understanding; severe headaches; sudden numbness or weakness of the face, arm or leg; trouble walking; dizziness; loss of coordination This list may not describe all possible side effects. Call your doctor for medical advice about side effects. You may report side effects to FDA at 1-800-FDA-1088. Where should I keep my medicine? Keep out of the reach of children. Store at room temperature between 20 and 25 degrees C (68 and 77 degrees F). Throw away any unused medicine after the expiration date. NOTE: This sheet is a summary. It may not cover all possible information. If you have questions about this medicine, talk to your doctor, pharmacist, or health care provider.  2021 [employer] (2019-11-04 16:54:11)

## 2020-02-11 NOTE — Progress Notes (Signed)
Electrophysiology Office Note   Date:  02/12/2020   ID:  Artemis, Koller [birth year], MRN [id number]  PCP:  [name], MD  Cardiologist:   Primary Electrophysiologist:  Shequita Peplinski Meredith Leeds, MD    Chief Complaint: SVT   History of Present Illness: Patricia [name] is a 70 y.o. female who is being seen today for the evaluation of SVT at the request of [name], MD. Presenting today for electrophysiology evaluation.  She has a history of hypertension, hyperlipidemia, TIA, IBS, SVT, and back pain.  She was referred June 2020 for an episode of SVT.  She wore a cardiac monitor that did show SVT and no atrial fibrillation.  She went to the emergency room 12/14/2019 and was treated with adenosine with termination of SVT.  Historically, she would use vagal maneuvers.  Her symptoms were weakness, lightheadedness, numbness in her hands and feet.  The symptoms abated when she converted to normal rhythm.  She is currently on metoprolol and has not had further palpitations.  Today, she denies symptoms of palpitations, chest pain, shortness of breath, orthopnea, PND, lower extremity edema, claudication, dizziness, presyncope, syncope, bleeding, or neurologic sequela. The patient is tolerating medications without difficulties.  She did wear a cardiac monitor that showed episodes of SVT as well as episodes of atrial fibrillation.  She had a 2% atrial fibrillation burden.   Past Medical History:  Diagnosis Date  . Anxiety   . GERD (gastroesophageal reflux disease)   . Hyperlipidemia   . Hypertension   . IBS (irritable bowel syndrome)   . Insomnia   . Low back pain   . TIA (transient ischemic attack) 10/2017   no deficits  . Vitamin B12 deficiency   . Vitamin D deficiency    Past Surgical History:  Procedure Laterality Date  . BACK SURGERY    . BREAST EXCISIONAL BIOPSY Left 1997   benign  . hemorrhoidecotmy    . HERNIA MESH REMOVAL    . OPEN REDUCTION INTERNAL FIXATION  (ORIF) DISTAL RADIAL FRACTURE Right 11/19/2019   Procedure: OPEN REDUCTION INTERNAL FIXATION (ORIF) DISTAL RADIUS AND ULNA FRACTURE WITH REPAIR AS NECESSARY;  Surgeon: Roseanne Kaufman, MD;  Location: [facility];  Service: Orthopedics;  Laterality: Right;  2 hrs Block with IV sedation  . ORIF RADIUS & ULNA FRACTURES    . TONSILLECTOMY       Current Outpatient Medications  Medication Sig Dispense Refill  . acetaminophen (TYLENOL) 325 MG tablet 1 tablet as needed.    Marland Kitchen apixaban (ELIQUIS) 5 MG TABS tablet Take 1 tablet (5 mg total) by mouth 2 (two) times daily. 60 tablet 6  . Black Cohosh (REMIFEMIN PO) Take 1 tablet by mouth in the morning and at bedtime.    . carboxymethylcellulose (REFRESH PLUS) 0.5 % SOLN Place 1 drop into both eyes 3 (three) times daily as needed (for dryness).     . carisoprodol (SOMA) 350 MG tablet Take 350 mg by mouth 3 (three) times daily.   5  . Cholecalciferol (VITAMIN D3) 50 MCG (2000 UT) TABS Take 2,000 Units by mouth daily.    . clobetasol ointment (TEMOVATE) 8.56 % Apply 1 application topically See admin instructions. Apply to vaginal area daily as directed    . clonazePAM (KLONOPIN) 1 MG tablet Take 1 mg by mouth at bedtime.   3  . fluticasone (FLONASE) 50 MCG/ACT nasal spray Place 2 sprays into both nostrils daily as needed for allergies or rhinitis.   5  .  gabapentin (NEURONTIN) 600 MG tablet Take 600 mg by mouth See admin instructions. Take 600 mg by mouth in the morning and at lunchtime    . hydrOXYzine (ATARAX/VISTARIL) 10 MG tablet Take 10 mg by mouth at bedtime as needed (for sleep).    . hyoscyamine (LEVSIN) 0.125 MG tablet Take 0.125 mg by mouth as needed.    Marland Kitchen LINZESS 290 MCG CAPS capsule Take 290 mcg by mouth daily at 4 PM.   6  . losartan (COZAAR) 100 MG tablet Take 100 mg by mouth daily.     Marland Kitchen omeprazole (PRILOSEC) 20 MG capsule Take 20 mg by mouth daily before breakfast.     . ondansetron (ZOFRAN) 4 MG tablet Take 1 tablet (4 mg total) by mouth every 8  (eight) hours as needed for nausea or vomiting. 30 tablet 1  . polyethylene glycol (MIRALAX / GLYCOLAX) packet Take 34 g by mouth in the morning.     . rosuvastatin (CRESTOR) 5 MG tablet TAKE 1 TABLET(5 MG) BY MOUTH DAILY (Patient taking differently: Take 5 mg by mouth daily.) 30 tablet 0  . traMADol (ULTRAM) 50 MG tablet Take 100 mg by mouth as needed.    . VENTOLIN HFA 108 (90 Base) MCG/ACT inhaler Inhale 2 puffs into the lungs every 6 (six) hours as needed for wheezing or shortness of breath.   5  . zaleplon (SONATA) 10 MG capsule Take 10 mg by mouth at bedtime as needed (for interrupted sleep).     . metoprolol tartrate (LOPRESSOR) 25 MG tablet Take 0.5 tablets (12.5 mg total) by mouth 2 (two) times daily. 60 tablet 1   No current facility-administered medications for this visit.    Allergies:   Augmentin [amoxicillin-pot clavulanate], Levaquin [levofloxacin in d5w], Mom [magnesium hydroxide], and Oxycodone   Social History:  The patient  reports that she has been smoking cigarettes. She has a 50.00 pack-year smoking history. She has never used smokeless tobacco. She reports current alcohol use. She reports that she does not use drugs.   Family History:  The patient's family history includes Breast cancer in her maternal aunt, paternal aunt, and sister.    ROS:  Please see the history of present illness.   Otherwise, review of systems is positive for none.   All other systems are reviewed and negative.    PHYSICAL EXAM: VS:  BP 108/78   Pulse (!) 59   Ht 5' 7.5" (1.715 m)   Wt 173 lb 9.6 oz (78.7 kg)   LMP  (LMP Unknown)   SpO2 97%   BMI 26.79 kg/m  , BMI Body mass index is 26.79 kg/m. GEN: Well nourished, well developed, in no acute distress  HEENT: normal  Neck: no JVD, carotid bruits, or masses Cardiac: RRR; no murmurs, rubs, or gallops,no edema  Respiratory:  clear to auscultation bilaterally, normal work of breathing GI: soft, nontender, nondistended, + BS MS: no  deformity or atrophy  Skin: warm and dry Neuro:  Strength and sensation are intact Psych: euthymic mood, full affect  EKG:  EKG is ordered today. Personal review of the ekg ordered shows sinus rhythm, rate 59  Recent Labs: 12/14/2019: ALT 15; BUN 7; Creatinine, Ser 0.57; Hemoglobin 15.1; Magnesium 1.9; Platelets 255; Potassium 4.0; Sodium 133 12/23/2019: TSH 1.580    Lipid Panel  No results found for: CHOL, TRIG, HDL, CHOLHDL, VLDL, LDLCALC, LDLDIRECT   Wt Readings from Last 3 Encounters:  02/11/20 173 lb 9.6 oz (78.7 kg)  12/23/19 171 lb  12.8 oz (77.9 kg)  12/05/19 172 lb (78 kg)      Other studies Reviewed: Additional studies/ records that were reviewed today include: TTE 2019  Review of the above records today demonstrates:  - Left ventricle: The cavity size was normal. Systolic function was  normal. The estimated ejection fraction was in the range of 55%  to 60%. Wall motion was normal; there were no regional wall  motion abnormalities. Doppler parameters are consistent with  abnormal left ventricular relaxation (grade 1 diastolic  dysfunction). There was no evidence of elevated ventricular  filling pressure by Doppler parameters.  - Aortic valve: There was no regurgitation.  - Mitral valve: There was trivial regurgitation.  - Right ventricle: The cavity size was normal. Wall thickness was  normal. Systolic function was normal.  - Tricuspid valve: There was mild regurgitation.  - Pulmonary arteries: Systolic pressure was within the normal  range.  - Inferior vena cava: The vessel was normal in size.  - Pericardium, extracardiac: There was no pericardial effusion.   Monitor 01/04/20 Max 234 bpm 02:47pm, 12/01 Min 46 bpm 11:11pm, 12/07 Avg 76 bpm Less than 1% ventricular and supraventricular ectopy The predominant underlying rhythm was sinus rhythm 2% atrial fibrillation burden with heart rates of 107 to 234 bpm Triggered events associated with both  atrial fibrillation and SVT.   ASSESSMENT AND PLAN:  1.  SVT: Symptoms improved on her current dose of metoprolol.  No changes.  2.  Hypertension: Currently well controlled  Paroxysmal atrial fibrillation: 2% burden noted on cardiac monitor.  CHA2DS2-VASc of 5.  Due to that, we Ramaj Frangos start her on Eliquis.  She feels much better on her current dose of metoprolol.  We Twanisha Foulk hold off on antiarrhythmics for now.  We Donita Newland stop her Plavix.  Current medicines are reviewed at length with the patient today.   The patient does not have concerns regarding her medicines.  The following changes were made today:  Start Eliquis, start Plavix  Labs/ tests ordered today include:  Orders Placed This Encounter  Procedures  . EKG 12-Lead     Disposition:   FU with Shakoya Gilmore 3 months  Signed, Wiliam Cauthorn Meredith Leeds, MD  02/12/2020 7:02 AM     [facility] [address] [place] [address] [phone] (office) [phone] (fax)

## 2020-02-16 DIAGNOSIS — M25631 Stiffness of right wrist, not elsewhere classified: Secondary | ICD-10-CM | POA: Diagnosis not present

## 2020-02-16 DIAGNOSIS — M25641 Stiffness of right hand, not elsewhere classified: Secondary | ICD-10-CM | POA: Diagnosis not present

## 2020-02-18 DIAGNOSIS — M25641 Stiffness of right hand, not elsewhere classified: Secondary | ICD-10-CM | POA: Diagnosis not present

## 2020-02-18 DIAGNOSIS — M25631 Stiffness of right wrist, not elsewhere classified: Secondary | ICD-10-CM | POA: Diagnosis not present

## 2020-02-23 DIAGNOSIS — M25641 Stiffness of right hand, not elsewhere classified: Secondary | ICD-10-CM | POA: Diagnosis not present

## 2020-02-23 DIAGNOSIS — M25631 Stiffness of right wrist, not elsewhere classified: Secondary | ICD-10-CM | POA: Diagnosis not present

## 2020-02-25 DIAGNOSIS — M25641 Stiffness of right hand, not elsewhere classified: Secondary | ICD-10-CM | POA: Diagnosis not present

## 2020-02-25 DIAGNOSIS — M25631 Stiffness of right wrist, not elsewhere classified: Secondary | ICD-10-CM | POA: Diagnosis not present

## 2020-02-29 ENCOUNTER — Ambulatory Visit: Payer: Medicare HMO | Admitting: Cardiology

## 2020-03-01 DIAGNOSIS — M25631 Stiffness of right wrist, not elsewhere classified: Secondary | ICD-10-CM | POA: Diagnosis not present

## 2020-03-01 DIAGNOSIS — M25641 Stiffness of right hand, not elsewhere classified: Secondary | ICD-10-CM | POA: Diagnosis not present

## 2020-03-03 DIAGNOSIS — M25631 Stiffness of right wrist, not elsewhere classified: Secondary | ICD-10-CM | POA: Diagnosis not present

## 2020-03-03 DIAGNOSIS — M25641 Stiffness of right hand, not elsewhere classified: Secondary | ICD-10-CM | POA: Diagnosis not present

## 2020-03-07 DIAGNOSIS — M25641 Stiffness of right hand, not elsewhere classified: Secondary | ICD-10-CM | POA: Diagnosis not present

## 2020-03-07 DIAGNOSIS — M25631 Stiffness of right wrist, not elsewhere classified: Secondary | ICD-10-CM | POA: Diagnosis not present

## 2020-03-10 DIAGNOSIS — S52531D Colles' fracture of right radius, subsequent encounter for closed fracture with routine healing: Secondary | ICD-10-CM | POA: Diagnosis not present

## 2020-03-10 DIAGNOSIS — M25631 Stiffness of right wrist, not elsewhere classified: Secondary | ICD-10-CM | POA: Diagnosis not present

## 2020-03-14 DIAGNOSIS — M25641 Stiffness of right hand, not elsewhere classified: Secondary | ICD-10-CM | POA: Diagnosis not present

## 2020-03-14 DIAGNOSIS — M25531 Pain in right wrist: Secondary | ICD-10-CM | POA: Diagnosis not present

## 2020-03-15 DIAGNOSIS — M7062 Trochanteric bursitis, left hip: Secondary | ICD-10-CM | POA: Diagnosis not present

## 2020-03-15 DIAGNOSIS — M7061 Trochanteric bursitis, right hip: Secondary | ICD-10-CM | POA: Diagnosis not present

## 2020-03-15 DIAGNOSIS — M1711 Unilateral primary osteoarthritis, right knee: Secondary | ICD-10-CM | POA: Diagnosis not present

## 2020-03-18 DIAGNOSIS — M25531 Pain in right wrist: Secondary | ICD-10-CM | POA: Diagnosis not present

## 2020-03-18 DIAGNOSIS — M25641 Stiffness of right hand, not elsewhere classified: Secondary | ICD-10-CM | POA: Diagnosis not present

## 2020-03-21 DIAGNOSIS — J029 Acute pharyngitis, unspecified: Secondary | ICD-10-CM | POA: Diagnosis not present

## 2020-03-21 DIAGNOSIS — R059 Cough, unspecified: Secondary | ICD-10-CM | POA: Diagnosis not present

## 2020-03-22 DIAGNOSIS — M25631 Stiffness of right wrist, not elsewhere classified: Secondary | ICD-10-CM | POA: Diagnosis not present

## 2020-03-22 DIAGNOSIS — J029 Acute pharyngitis, unspecified: Secondary | ICD-10-CM | POA: Diagnosis not present

## 2020-03-22 DIAGNOSIS — R059 Cough, unspecified: Secondary | ICD-10-CM | POA: Diagnosis not present

## 2020-03-22 DIAGNOSIS — Z03818 Encounter for observation for suspected exposure to other biological agents ruled out: Secondary | ICD-10-CM | POA: Diagnosis not present

## 2020-03-24 DIAGNOSIS — M25631 Stiffness of right wrist, not elsewhere classified: Secondary | ICD-10-CM | POA: Diagnosis not present

## 2020-03-29 DIAGNOSIS — M25631 Stiffness of right wrist, not elsewhere classified: Secondary | ICD-10-CM | POA: Diagnosis not present

## 2020-03-31 DIAGNOSIS — M25631 Stiffness of right wrist, not elsewhere classified: Secondary | ICD-10-CM | POA: Diagnosis not present

## 2020-03-31 DIAGNOSIS — M25641 Stiffness of right hand, not elsewhere classified: Secondary | ICD-10-CM | POA: Diagnosis not present

## 2020-04-01 ENCOUNTER — Other Ambulatory Visit: Payer: Medicare HMO

## 2020-04-05 DIAGNOSIS — M25631 Stiffness of right wrist, not elsewhere classified: Secondary | ICD-10-CM | POA: Diagnosis not present

## 2020-04-07 DIAGNOSIS — M25631 Stiffness of right wrist, not elsewhere classified: Secondary | ICD-10-CM | POA: Diagnosis not present

## 2020-04-07 DIAGNOSIS — M25531 Pain in right wrist: Secondary | ICD-10-CM | POA: Diagnosis not present

## 2020-04-08 DIAGNOSIS — Z780 Asymptomatic menopausal state: Secondary | ICD-10-CM | POA: Diagnosis not present

## 2020-04-18 DIAGNOSIS — I480 Paroxysmal atrial fibrillation: Secondary | ICD-10-CM | POA: Diagnosis not present

## 2020-04-18 DIAGNOSIS — G8929 Other chronic pain: Secondary | ICD-10-CM | POA: Diagnosis not present

## 2020-04-18 DIAGNOSIS — E559 Vitamin D deficiency, unspecified: Secondary | ICD-10-CM | POA: Diagnosis not present

## 2020-04-18 DIAGNOSIS — I10 Essential (primary) hypertension: Secondary | ICD-10-CM | POA: Diagnosis not present

## 2020-04-18 DIAGNOSIS — K219 Gastro-esophageal reflux disease without esophagitis: Secondary | ICD-10-CM | POA: Diagnosis not present

## 2020-04-18 DIAGNOSIS — R490 Dysphonia: Secondary | ICD-10-CM | POA: Diagnosis not present

## 2020-04-18 DIAGNOSIS — I471 Supraventricular tachycardia: Secondary | ICD-10-CM | POA: Diagnosis not present

## 2020-04-18 DIAGNOSIS — E7800 Pure hypercholesterolemia, unspecified: Secondary | ICD-10-CM | POA: Diagnosis not present

## 2020-04-18 DIAGNOSIS — M545 Low back pain, unspecified: Secondary | ICD-10-CM | POA: Diagnosis not present

## 2020-04-18 DIAGNOSIS — Z0000 Encounter for general adult medical examination without abnormal findings: Secondary | ICD-10-CM | POA: Diagnosis not present

## 2020-04-18 DIAGNOSIS — F411 Generalized anxiety disorder: Secondary | ICD-10-CM | POA: Diagnosis not present

## 2020-04-18 DIAGNOSIS — K5901 Slow transit constipation: Secondary | ICD-10-CM | POA: Diagnosis not present

## 2020-04-29 DIAGNOSIS — J343 Hypertrophy of nasal turbinates: Secondary | ICD-10-CM | POA: Diagnosis not present

## 2020-04-29 DIAGNOSIS — K219 Gastro-esophageal reflux disease without esophagitis: Secondary | ICD-10-CM | POA: Diagnosis not present

## 2020-04-29 DIAGNOSIS — R070 Pain in throat: Secondary | ICD-10-CM | POA: Diagnosis not present

## 2020-05-10 ENCOUNTER — Other Ambulatory Visit: Payer: Self-pay

## 2020-05-10 ENCOUNTER — Ambulatory Visit: Payer: Medicare HMO | Admitting: Cardiology

## 2020-05-10 ENCOUNTER — Encounter: Payer: Self-pay | Admitting: Cardiology

## 2020-05-10 VITALS — BP 130/76 | HR 76 | Ht 67.5 in | Wt 179.4 lb

## 2020-05-10 DIAGNOSIS — I471 Supraventricular tachycardia: Secondary | ICD-10-CM

## 2020-05-10 NOTE — Progress Notes (Signed)
Electrophysiology Office Note   Date:  05/10/2020   ID:  Patricia, [name] 1950/03/21, MRN [id number]  PCP:  [name], MD  Cardiologist:   Primary Electrophysiologist:  Chrishauna Mee Meredith Leeds, MD    Chief Complaint: SVT   History of Present Illness: Patricia [name] is a 70 y.o. female who is being seen today for the evaluation of SVT at the request of [name], MD. Presenting today for electrophysiology evaluation.  She has a history of hypertension, hyperlipidemia, TIA, IBS, SVT, and back pain.  She was referred June 2020 for an episode of SVT.  She wore a cardiac monitor that showed episodes of SVT.  She presented to emergency room 12/14/2019 and was treated with adenosine.  Historically, she has tried vagal maneuvers.  Her symptoms are lightheadedness, weakness, and numbness in hands and feet.  She wore a cardiac monitor that did show evidence of atrial fibrillation with a 2% burden.  She has since been started on Eliquis.  Today, denies symptoms of palpitations, chest pain, shortness of breath, orthopnea, PND, lower extremity edema, claudication, dizziness, presyncope, syncope, bleeding, or neurologic sequela. The patient is tolerating medications without difficulties.  Since last being seen she has done well.  She has no chest pain or shortness of breath.  She has noted no further episodes of atrial fibrillation or SVT since starting metoprolol.  She has quit smoking, her last cigarette was in December.   Past Medical History:  Diagnosis Date  . Anxiety   . GERD (gastroesophageal reflux disease)   . Hyperlipidemia   . Hypertension   . IBS (irritable bowel syndrome)   . Insomnia   . Low back pain   . TIA (transient ischemic attack) 10/2017   no deficits  . Vitamin B12 deficiency   . Vitamin D deficiency    Past Surgical History:  Procedure Laterality Date  . BACK SURGERY    . BREAST EXCISIONAL BIOPSY Left 1997   benign  . hemorrhoidecotmy    . HERNIA  MESH REMOVAL    . OPEN REDUCTION INTERNAL FIXATION (ORIF) DISTAL RADIAL FRACTURE Right 11/19/2019   Procedure: OPEN REDUCTION INTERNAL FIXATION (ORIF) DISTAL RADIUS AND ULNA FRACTURE WITH REPAIR AS NECESSARY;  Surgeon: Roseanne Kaufman, MD;  Location: [place];  Service: Orthopedics;  Laterality: Right;  2 hrs Block with IV sedation  . ORIF RADIUS & ULNA FRACTURES    . TONSILLECTOMY       Current Outpatient Medications  Medication Sig Dispense Refill  . acetaminophen (TYLENOL) 325 MG tablet 1 tablet as needed.    Marland Kitchen apixaban (ELIQUIS) 5 MG TABS tablet Take 1 tablet (5 mg total) by mouth 2 (two) times daily. 60 tablet 6  . Black Cohosh (REMIFEMIN PO) Take 1 tablet by mouth in the morning and at bedtime.    . carboxymethylcellulose (REFRESH PLUS) 0.5 % SOLN Place 1 drop into both eyes 3 (three) times daily as needed (for dryness).     . carisoprodol (SOMA) 350 MG tablet Take 350 mg by mouth 3 (three) times daily.   5  . Cholecalciferol (VITAMIN D3) 50 MCG (2000 UT) TABS Take 2,000 Units by mouth daily.    . clobetasol ointment (TEMOVATE) 1.93 % Apply 1 application topically See admin instructions. Apply to vaginal area daily as directed    . clonazePAM (KLONOPIN) 1 MG tablet Take 1 mg by mouth at bedtime.   3  . fluticasone (FLONASE) 50 MCG/ACT nasal spray Place 2 sprays into both nostrils  daily as needed for allergies or rhinitis.   5  . gabapentin (NEURONTIN) 600 MG tablet Take 600 mg by mouth See admin instructions. Take 600 mg by mouth in the morning and at lunchtime    . hydrOXYzine (ATARAX/VISTARIL) 10 MG tablet Take 10 mg by mouth at bedtime as needed (for sleep).    . hyoscyamine (LEVSIN) 0.125 MG tablet Take 0.125 mg by mouth as needed.    Marland Kitchen LINZESS 290 MCG CAPS capsule Take 290 mcg by mouth daily at 4 PM.   6  . losartan (COZAAR) 100 MG tablet Take 100 mg by mouth daily.     Marland Kitchen omeprazole (PRILOSEC) 20 MG capsule Take 20 mg by mouth daily before breakfast.     . ondansetron (ZOFRAN) 4 MG  tablet Take 1 tablet (4 mg total) by mouth every 8 (eight) hours as needed for nausea or vomiting. 30 tablet 1  . polyethylene glycol (MIRALAX / GLYCOLAX) packet Take 34 g by mouth in the morning.     . traMADol (ULTRAM) 50 MG tablet Take 100 mg by mouth as needed.    . VENTOLIN HFA 108 (90 Base) MCG/ACT inhaler Inhale 2 puffs into the lungs every 6 (six) hours as needed for wheezing or shortness of breath.   5  . zaleplon (SONATA) 10 MG capsule Take 10 mg by mouth at bedtime as needed (for interrupted sleep).     . metoprolol tartrate (LOPRESSOR) 25 MG tablet Take 0.5 tablets (12.5 mg total) by mouth 2 (two) times daily. 60 tablet 1   No current facility-administered medications for this visit.    Allergies:   Augmentin [amoxicillin-pot clavulanate], Levaquin [levofloxacin in d5w], Mom [magnesium hydroxide], and Oxycodone   Social History:  The patient  reports that she has been smoking cigarettes. She has a 50.00 pack-year smoking history. She has never used smokeless tobacco. She reports current alcohol use. She reports that she does not use drugs.   Family History:  The patient's family history includes Breast cancer in her maternal aunt, paternal aunt, and sister.   ROS:  Please see the history of present illness.   Otherwise, review of systems is positive for none.   All other systems are reviewed and negative.   PHYSICAL EXAM: VS:  BP 130/76   Pulse 76   Ht 5' 7.5" (1.715 m)   Wt 179 lb 6.4 oz (81.4 kg)   LMP  (LMP Unknown)   SpO2 91%   BMI 27.68 kg/m  , BMI Body mass index is 27.68 kg/m. GEN: Well nourished, well developed, in no acute distress  HEENT: normal  Neck: no JVD, carotid bruits, or masses Cardiac: RRR; no murmurs, rubs, or gallops,no edema  Respiratory:  clear to auscultation bilaterally, normal work of breathing GI: soft, nontender, nondistended, + BS MS: no deformity or atrophy  Skin: warm and dry Neuro:  Strength and sensation are intact Psych: euthymic  mood, full affect  EKG:  EKG is ordered today. Personal review of the ekg ordered shows sinus rhythm  Recent Labs: 12/14/2019: ALT 15; BUN 7; Creatinine, Ser 0.57; Hemoglobin 15.1; Magnesium 1.9; Platelets 255; Potassium 4.0; Sodium 133 12/23/2019: TSH 1.580    Lipid Panel  No results found for: CHOL, TRIG, HDL, CHOLHDL, VLDL, LDLCALC, LDLDIRECT   Wt Readings from Last 3 Encounters:  05/10/20 179 lb 6.4 oz (81.4 kg)  02/11/20 173 lb 9.6 oz (78.7 kg)  12/23/19 171 lb 12.8 oz (77.9 kg)      Other studies  Reviewed: Additional studies/ records that were reviewed today include: TTE 2019  Review of the above records today demonstrates:  - Left ventricle: The cavity size was normal. Systolic function was  normal. The estimated ejection fraction was in the range of 55%  to 60%. Wall motion was normal; there were no regional wall  motion abnormalities. Doppler parameters are consistent with  abnormal left ventricular relaxation (grade 1 diastolic  dysfunction). There was no evidence of elevated ventricular  filling pressure by Doppler parameters.  - Aortic valve: There was no regurgitation.  - Mitral valve: There was trivial regurgitation.  - Right ventricle: The cavity size was normal. Wall thickness was  normal. Systolic function was normal.  - Tricuspid valve: There was mild regurgitation.  - Pulmonary arteries: Systolic pressure was within the normal  range.  - Inferior vena cava: The vessel was normal in size.  - Pericardium, extracardiac: There was no pericardial effusion.   Monitor 01/04/20 Max 234 bpm 02:47pm, 12/01 Min 46 bpm 11:11pm, 12/07 Avg 76 bpm Less than 1% ventricular and supraventricular ectopy The predominant underlying rhythm was sinus rhythm 2% atrial fibrillation burden with heart rates of 107 to 234 bpm Triggered events associated with both atrial fibrillation and SVT.   ASSESSMENT AND PLAN:  1.  SVT: Symptoms improved on current dose  of metoprolol.  No changes.  No further episodes on her metoprolol.  No changes.  2.  Hypertension: Currently well controlled  3.  Paroxysmal atrial fibrillation: 2% burden noted on cardiac monitor.  CHA2DS2-VASc of 5.  Currently on Eliquis.  No further episodes  Current medicines are reviewed at length with the patient today.   The patient does not have concerns regarding her medicines.  The following changes were made today: None  Labs/ tests ordered today include:  Orders Placed This Encounter  Procedures  . EKG 12-Lead     Disposition:   FU with Mickenzie Stolar 12 months  Signed, Amalea Ottey Meredith Leeds, MD  05/10/2020 1:53 PM     [address] [place] [address] [phone] (office) [phone] (fax)

## 2020-05-23 DIAGNOSIS — H2513 Age-related nuclear cataract, bilateral: Secondary | ICD-10-CM | POA: Diagnosis not present

## 2020-05-23 DIAGNOSIS — H35373 Puckering of macula, bilateral: Secondary | ICD-10-CM | POA: Diagnosis not present

## 2020-05-23 DIAGNOSIS — H35033 Hypertensive retinopathy, bilateral: Secondary | ICD-10-CM | POA: Diagnosis not present

## 2020-05-23 DIAGNOSIS — H04123 Dry eye syndrome of bilateral lacrimal glands: Secondary | ICD-10-CM | POA: Diagnosis not present

## 2020-05-25 DIAGNOSIS — K219 Gastro-esophageal reflux disease without esophagitis: Secondary | ICD-10-CM | POA: Diagnosis not present

## 2020-05-25 DIAGNOSIS — I10 Essential (primary) hypertension: Secondary | ICD-10-CM | POA: Diagnosis not present

## 2020-05-25 DIAGNOSIS — G4700 Insomnia, unspecified: Secondary | ICD-10-CM | POA: Diagnosis not present

## 2020-05-25 DIAGNOSIS — G459 Transient cerebral ischemic attack, unspecified: Secondary | ICD-10-CM | POA: Diagnosis not present

## 2020-05-25 DIAGNOSIS — E7800 Pure hypercholesterolemia, unspecified: Secondary | ICD-10-CM | POA: Diagnosis not present

## 2020-05-25 DIAGNOSIS — I480 Paroxysmal atrial fibrillation: Secondary | ICD-10-CM | POA: Diagnosis not present

## 2020-05-27 DIAGNOSIS — J209 Acute bronchitis, unspecified: Secondary | ICD-10-CM | POA: Diagnosis not present

## 2020-06-29 ENCOUNTER — Ambulatory Visit
Admission: RE | Admit: 2020-06-29 | Discharge: 2020-06-29 | Disposition: A | Discharge status: Home / Self Care | Payer: Medicare HMO | Source: Ambulatory Visit | Attending: Family Medicine | Admitting: Family Medicine

## 2020-06-29 ENCOUNTER — Other Ambulatory Visit: Payer: Self-pay | Admitting: Family Medicine

## 2020-06-29 DIAGNOSIS — R070 Pain in throat: Secondary | ICD-10-CM | POA: Diagnosis not present

## 2020-06-29 DIAGNOSIS — R059 Cough, unspecified: Secondary | ICD-10-CM

## 2020-06-29 DIAGNOSIS — K219 Gastro-esophageal reflux disease without esophagitis: Secondary | ICD-10-CM | POA: Diagnosis not present

## 2020-07-14 DIAGNOSIS — M1711 Unilateral primary osteoarthritis, right knee: Secondary | ICD-10-CM | POA: Diagnosis not present

## 2020-07-14 DIAGNOSIS — M7062 Trochanteric bursitis, left hip: Secondary | ICD-10-CM | POA: Diagnosis not present

## 2020-08-04 ENCOUNTER — Institutional Professional Consult (permissible substitution): Payer: Medicare HMO | Admitting: Internal Medicine

## 2020-08-10 DIAGNOSIS — M542 Cervicalgia: Secondary | ICD-10-CM | POA: Diagnosis not present

## 2020-08-11 ENCOUNTER — Other Ambulatory Visit: Payer: Self-pay

## 2020-08-11 ENCOUNTER — Encounter: Payer: Self-pay | Admitting: Internal Medicine

## 2020-08-11 ENCOUNTER — Ambulatory Visit (INDEPENDENT_AMBULATORY_CARE_PROVIDER_SITE_OTHER): Payer: Medicare HMO | Admitting: Internal Medicine

## 2020-08-11 DIAGNOSIS — J45909 Unspecified asthma, uncomplicated: Secondary | ICD-10-CM

## 2020-08-11 MED ORDER — BUDESONIDE-FORMOTEROL FUMARATE 80-4.5 MCG/ACT IN AERO: INHALATION_SPRAY | RESPIRATORY_TRACT | 12 refills | Status: DC

## 2020-08-11 MED ORDER — CEFDINIR 300 MG PO CAPS: 300.0000 mg | ORAL_CAPSULE | Freq: Two times a day (BID) | ORAL | 0 refills | Status: DC

## 2020-08-11 NOTE — Assessment & Plan Note (Addendum)
Quit smoking 12/2019 - 08/11/2020 rec trial of symb 80 or dulera 100 2bid and then return for pfts - sinus CT .08/11/2020   DDX of  difficult airways management almost all start with A and  include Adherence, Ace Inhibitors, Acid Reflux, Active Sinus Disease, Alpha 1 Antitripsin deficiency, Anxiety masquerading as Airways dz,  ABPA,  Allergy(esp in young), Aspiration (esp in elderly), Adverse effects of meds,  Active smoking or vaping, A bunch of PE's (a small clot burden can't cause this syndrome unless there is already severe underlying pulm or vascular dz with poor reserve) plus two Bs  = Bronchiectasis and Beta blocker use..and one C= CHF  Adherence is always the initial "prime suspect" and is a multilayered concern that requires a "trust but verify" approach in every patient - starting with knowing how to use medications, especially inhalers, correctly, keeping up with refills and understanding the fundamental difference between maintenance and prns vs those medications only taken for a very short course and then stopped and not refilled.  - - The proper method of use, as well as anticipated side effects, of a metered-dose inhaler were discussed and demonstrated to the patient using teach back method.   ? Allergy/ asthma > trial of symb 80 or dulera 100 2bid  ? Active sinus Dz > omnicef x 10 days and sinus ct rec   ? Acid (or non-acid) GERD > always difficult to exclude as up to 75% of pts in some series report no assoc GI/ Heartburn symptoms> rec change ppi to take 30-60 min ac   Alpha one or ABPA > check labs on return if not better  ? Bronchiectasis > hrct if all studies neg and no response to above rx           Each maintenance medication was reviewed in detail including emphasizing most importantly the difference between maintenance and prns and under what circumstances the prns are to be triggered using an action plan format where appropriate.  Total time for H and P, chart review,  counseling, reviewing hfa  device(s) and generating customized AVS unique to this new pt office visit / same day charting = 45 min

## 2020-08-11 NOTE — Progress Notes (Signed)
Patricia [name], female    DOB: 03/18/1950,    MRN: [id number]   Brief patient profile:  30 yowf  quit smoking 12/2019   referred to pulmonary clinic 08/11/2020 by Dr  Patricia [name] for cough.   Healthy child in her 60s some am and hs cough then stopped smoking due to SVT rx lopressor then w/in a few days onset of worse cough than baseline with green mucus production in am and all no better with pred or abx      History of Present Illness  08/11/2020  Pulmonary/ 1st office eval/Patricia [name]  Chief Complaint  Patient presents with   Consult    Referred by Dr. Gwyndolyn [name] for persistent cough times 8 months. Coughing green sputum.  Dyspnea:  worse in heat and p  20 lb wt, but still able to do shopping fine / pushing cart ok  Cough: first thing in am - better on ppi by Patricia [name] but still green, no change p 2 courses of abx by Dr Patricia [name]  Sleep: bed is flat, one pillow SABA use: albuterol helps some for a few hour  No obvious day to day or daytime variability or assoc excess/ purulent sputum or mucus plugs or hemoptysis or cp or chest tightness, subjective wheeze or overt sinus or hb symptoms.   Sleeping as above without nocturnal   exacerbation  of respiratory  c/o's or need for noct saba. Also denies any obvious fluctuation of symptoms with weather or environmental changes or other aggravating or alleviating factors except as outlined above   No unusual exposure hx or h/o childhood pna/ asthma or knowledge of premature birth.  Current Allergies, Complete Past Medical History, Past Surgical History, Family History, and Social History were reviewed in [employer] record.  ROS  The following are not active complaints unless bolded Hoarseness, sore throat, dysphagia, dental problems, itching, sneezing,  nasal congestion or discharge of excess mucus or purulent secretions, ear ache,   fever, chills, sweats, unintended wt loss or wt gain, classically pleuritic or exertional cp,  orthopnea pnd  or arm/hand swelling  or leg swelling, presyncope, palpitations, abdominal pain, anorexia, nausea, vomiting, diarrhea  or change in bowel habits or change in bladder habits, change in stools or change in urine, dysuria, hematuria,  rash, arthralgias, visual complaints, headache, numbness, weakness or ataxia or problems with walking or coordination,  change in mood or  memory.           Past Medical History:  Diagnosis Date   Anxiety    GERD (gastroesophageal reflux disease)    Hyperlipidemia    Hypertension    IBS (irritable bowel syndrome)    Insomnia    Low back pain    TIA (transient ischemic attack) 10/2017   no deficits   Vitamin B12 deficiency    Vitamin D deficiency     Outpatient Medications Prior to Visit  Medication Sig Dispense Refill   acetaminophen (TYLENOL) 325 MG tablet 1 tablet as needed.     apixaban (ELIQUIS) 5 MG TABS tablet Take 1 tablet (5 mg total) by mouth 2 (two) times daily. 60 tablet 6   Black Cohosh (REMIFEMIN PO) Take 1 tablet by mouth in the morning and at bedtime.     carboxymethylcellulose (REFRESH PLUS) 0.5 % SOLN Place 1 drop into both eyes 3 (three) times daily as needed (for dryness).      carisoprodol (SOMA) 350 MG tablet Take 350 mg by mouth 3 (three) times daily.  5   Cholecalciferol (VITAMIN D3) 50 MCG (2000 UT) TABS Take 2,000 Units by mouth daily.     clobetasol ointment (TEMOVATE) 5.64 % Apply 1 application topically See admin instructions. Apply to vaginal area daily as directed     clonazePAM (KLONOPIN) 1 MG tablet Take 1 mg by mouth at bedtime.   3   fluticasone (FLONASE) 50 MCG/ACT nasal spray Place 2 sprays into both nostrils daily as needed for allergies or rhinitis.   5   gabapentin (NEURONTIN) 600 MG tablet Take 600 mg by mouth See admin instructions. Take 600 mg by mouth in the morning and at lunchtime     hydrOXYzine (ATARAX/VISTARIL) 10 MG tablet Take 10 mg by mouth at bedtime as needed (for sleep).     hyoscyamine (LEVSIN) 0.125  MG tablet Take 0.125 mg by mouth as needed.     LINZESS 290 MCG CAPS capsule Take 290 mcg by mouth daily at 4 PM.   6   losartan (COZAAR) 100 MG tablet Take 100 mg by mouth daily.      omeprazole (PRILOSEC) 20 MG capsule Take 20 mg by mouth daily before breakfast.      ondansetron (ZOFRAN) 4 MG tablet Take 1 tablet (4 mg total) by mouth every 8 (eight) hours as needed for nausea or vomiting. 30 tablet 1   polyethylene glycol (MIRALAX / GLYCOLAX) packet Take 34 g by mouth in the morning.      rosuvastatin (CRESTOR) 5 MG tablet Take 5 mg by mouth daily.     traMADol (ULTRAM) 50 MG tablet Take 100 mg by mouth as needed.     VENTOLIN HFA 108 (90 Base) MCG/ACT inhaler Inhale 2 puffs into the lungs every 6 (six) hours as needed for wheezing or shortness of breath.   5   zaleplon (SONATA) 10 MG capsule Take 10 mg by mouth at bedtime as needed (for interrupted sleep).      metoprolol tartrate (LOPRESSOR) 25 MG tablet Take 0.5 tablets (12.5 mg total) by mouth 2 (two) times daily. 60 tablet 1   No facility-administered medications prior to visit.     Objective:     BP 116/68 (BP Location: Left Arm, Cuff Size: Normal)   Pulse 76   Ht 5\' 7"  (1.702 m)   Wt 185 lb 6.4 oz (84.1 kg)   LMP  (LMP Unknown)   SpO2 93% Comment: ra  BMI 29.04 kg/m   SpO2: 93 % (ra)  Amb wf nad   HEENT : pt wearing mask not removed for exam due to covid -19 concerns.    NECK :  without JVD/Nodes/TM/ nl carotid upstrokes bilaterally   LUNGS: no acc muscle use,  Nl contour chest which is clear to A and P bilaterally without cough on insp or exp maneuvers   CV:  RRR  no s3 or murmur or increase in P2, and no edema   ABD:  soft and nontender with nl inspiratory excursion in the supine position. No bruits or organomegaly appreciated, bowel sounds nl  MS:  Nl gait/ ext warm without deformities, calf tenderness, cyanosis or clubbing No obvious joint restrictions   SKIN: warm and dry without lesions    NEURO:   alert, approp, nl sensorium with  no motor or cerebellar deficits apparent.        I personally reviewed images and agree with radiology impression as follows:  CXR:  06/29/20 No active cardiopulmonary disease.     Assessment   Asthmatic bronchitis Quit  smoking 12/2019 - 08/11/2020 rec trial of symb 80 or dulera 100 2bid and then return for pfts - sinus CT .08/11/2020   DDX of  difficult airways management almost all start with A and  include Adherence, Ace Inhibitors, Acid Reflux, Active Sinus Disease, Alpha 1 Antitripsin deficiency, Anxiety masquerading as Airways dz,  ABPA,  Allergy(esp in young), Aspiration (esp in elderly), Adverse effects of meds,  Active smoking or vaping, A bunch of PE's (a small clot burden can't cause this syndrome unless there is already severe underlying pulm or vascular dz with poor reserve) plus two Bs  = Bronchiectasis and Beta blocker use..and one C= CHF  Adherence is always the initial "prime suspect" and is a multilayered concern that requires a "trust but verify" approach in every patient - starting with knowing how to use medications, especially inhalers, correctly, keeping up with refills and understanding the fundamental difference between maintenance and prns vs those medications only taken for a very short course and then stopped and not refilled.  - - The proper method of use, as well as anticipated side effects, of a metered-dose inhaler were discussed and demonstrated to the patient using teach back method.   ? Allergy/ asthma > trial of symb 80 or dulera 100 2bid  ? Active sinus Dz > omnicef x 10 days and sinus ct rec   ? Acid (or non-acid) GERD > always difficult to exclude as up to 75% of pts in some series report no assoc GI/ Heartburn symptoms> rec change ppi to take 30-60 min ac   Alpha one or ABPA > check labs on return if not better  ? Bronchiectasis > hrct if all studies neg and no response to above rx           Each maintenance  medication was reviewed in detail including emphasizing most importantly the difference between maintenance and prns and under what circumstances the prns are to be triggered using an action plan format where appropriate.  Total time for H and P, chart review, counseling, reviewing hfa  device(s) and generating customized AVS unique to this new pt office visit / same day charting = 45 min           Christinia Gully, MD 08/11/2020

## 2020-08-11 NOTE — Patient Instructions (Addendum)
Plan A = Automatic = Always=    symbicort 80 (or dulera100) Take 2 puffs first thing in am and then another 2 puffs about 12 hours later.    Work on inhaler technique:  relax and gently blow all the way out then take a nice smooth full deep breath back in, triggering the inhaler at same time you start breathing in.  Hold for up to 5 seconds if you can. Blow out thru nose. Rinse and gargle with water when done.  If mouth or throat bother you at all,  try brushing teeth/gums/tongue with arm and hammer toothpaste/ make a slurry and gargle and spit out.     Plan B = Backup (to supplement plan A, not to replace it) Only use your albuterol inhaler (ventolin) as a rescue medication to be used if you can't catch your breath by resting or doing a relaxed purse lip breathing pattern.  - The less you use it, the better it will work when you need it. - Ok to use the inhaler up to 2 puffs  every 4 hours if you must but call for appointment if use goes up over your usual need - Don't leave home without it !!  (think of it like the spare tire for your car)     Omnicef 300 mg twice daily x 10days   For mucinex dm 1200 mg every 12 hours as needed   Prilosec 20mg   Take 30-60 min before first meal of the day   We will call to schedule you for a ct scan   Please schedule a follow up office visit in 6 weeks, call sooner if needed - bring your inhalers with you  PFT's on return

## 2020-08-17 ENCOUNTER — Ambulatory Visit (INDEPENDENT_AMBULATORY_CARE_PROVIDER_SITE_OTHER)
Admission: RE | Admit: 2020-08-17 | Discharge: 2020-08-17 | Disposition: A | Discharge status: Home / Self Care | Payer: Medicare HMO | Source: Ambulatory Visit | Attending: Internal Medicine | Admitting: Internal Medicine

## 2020-08-17 ENCOUNTER — Other Ambulatory Visit: Payer: Self-pay

## 2020-08-17 DIAGNOSIS — J45909 Unspecified asthma, uncomplicated: Secondary | ICD-10-CM | POA: Diagnosis not present

## 2020-08-17 DIAGNOSIS — R519 Headache, unspecified: Secondary | ICD-10-CM | POA: Diagnosis not present

## 2020-08-18 ENCOUNTER — Encounter: Payer: Self-pay | Admitting: *Deleted

## 2020-08-18 DIAGNOSIS — I480 Paroxysmal atrial fibrillation: Secondary | ICD-10-CM | POA: Diagnosis not present

## 2020-08-18 DIAGNOSIS — E7800 Pure hypercholesterolemia, unspecified: Secondary | ICD-10-CM | POA: Diagnosis not present

## 2020-08-18 DIAGNOSIS — G459 Transient cerebral ischemic attack, unspecified: Secondary | ICD-10-CM | POA: Diagnosis not present

## 2020-08-18 DIAGNOSIS — G4700 Insomnia, unspecified: Secondary | ICD-10-CM | POA: Diagnosis not present

## 2020-08-18 DIAGNOSIS — K219 Gastro-esophageal reflux disease without esophagitis: Secondary | ICD-10-CM | POA: Diagnosis not present

## 2020-08-18 DIAGNOSIS — I10 Essential (primary) hypertension: Secondary | ICD-10-CM | POA: Diagnosis not present

## 2020-09-04 NOTE — Telephone Encounter (Signed)
MW please advise. Thanks  [name]  [facility] Pool Dr. Melvyn Novas I have been gargling and rinse my mouth after using my Aim a cord but apparently not good enough because I have a yeast infection in my mouth. Could you please call men in something for this as I feel awful  Thanks Patricia [name] All I use [facility] on [address]

## 2020-09-05 MED ORDER — CLOTRIMAZOLE 10 MG MT TROC: 10.0000 mg | Freq: Four times a day (QID) | OROMUCOSAL | 0 refills | Status: DC | PRN

## 2020-09-05 NOTE — Telephone Encounter (Signed)
Prefer be seen if this is the first time it's happened, but if nothing available with NP's today ok to try clotrimazole troche qid prn   # 20  without refills and we'll need to regroup and teach spacer at next available ov

## 2020-09-13 ENCOUNTER — Other Ambulatory Visit: Payer: Medicare HMO

## 2020-09-18 NOTE — Telephone Encounter (Signed)
MW please advise. Pt does have a pending appt with you on 9/26.  Well I have another yeast infection and I don't think I'm going to be able to use this Symbicort.I feel like its in my whole esophagus. Is there anything else that does not have cortisone in it. I rinse and gargle twice when I use it and it's just not helping. Thanks, [employer]

## 2020-09-19 ENCOUNTER — Ambulatory Visit (INDEPENDENT_AMBULATORY_CARE_PROVIDER_SITE_OTHER): Payer: Medicare HMO | Admitting: Internal Medicine

## 2020-09-19 ENCOUNTER — Encounter: Payer: Self-pay | Admitting: Internal Medicine

## 2020-09-19 DIAGNOSIS — J45909 Unspecified asthma, uncomplicated: Secondary | ICD-10-CM | POA: Diagnosis not present

## 2020-09-19 MED ORDER — FLUCONAZOLE 100 MG PO TABS: 100.0000 mg | ORAL_TABLET | Freq: Every day | ORAL | 0 refills | Status: DC

## 2020-09-19 NOTE — Assessment & Plan Note (Signed)
Quit smoking 12/2019 - 08/11/2020 rec trial of symb 80 or dulera 100 2bid and then return for pfts - sinus CT  08/17/20 Clear sinuses and nasal cavity. - d/cd symbicort 80 due to thoat irriation c/w thrush 09/15/20 > improved by 09/19/2020 s increase symptoms or need for saba  Reviewed indications to restart symbicort 80 at one bid if breathing worse / increase need for saba between now and f/u ov and low threshold to treat with diflucan 100 mg x one repeat up to 3 total doses if flares back on lower doses/ max oral hygiene reviewed.  Each maintenance medication was reviewed in detail including most importantly the difference between maintenance and as needed and under what circumstances the prns are to be used.  Please see AVS for specific  Instructions which are unique to this visit and I personally typed out  which were reviewed in detail over the phone with the patient and a copy provided by MyChart

## 2020-09-19 NOTE — Progress Notes (Signed)
Patricia [name], female    DOB: 27-Dec-1950,    MRN: [id number]   Brief patient profile:  36 yowf  quit smoking 12/2019   referred to pulmonary clinic 08/11/2020 by Dr  Kenton Kingfisher for cough.   Healthy child in her 3s some am and hs cough then stopped smoking due to SVT rx lopressor then w/in a few days onset of worse cough than baseline with green mucus production in am and all no better with pred or abx      History of Present Illness  08/11/2020  Pulmonary/ 1st office eval/Edyth Glomb  Chief Complaint  Patient presents with   Consult    Referred by Dr. Gwyndolyn Saxon for persistent cough times 8 months. Coughing green sputum.  Dyspnea:  worse in heat and p  20 lb wt, but still able to do shopping fine / pushing cart ok  Cough: first thing in am - better on ppi by Redmond Baseman but still green, no change p 2 courses of abx by Dr Kenton Kingfisher  Sleep: bed is flat, one pillow SABA use: albuterol helps some for a few hours Rec Plan A = Automatic = Always=    symbicort 80 (or dulera100) Take 2 puffs first thing in am and then another 2 puffs about 12 hours later.  Work on inhaler technique:   Plan B = Backup (to supplement plan A, not to replace it) Only use your albuterol inhaler (ventolin) as a rescue medication  Omnicef 300 mg twice daily x 10days  For mucinex dm 1200 mg every 12 hours as needed  Prilosec 20mg   Take 30-60 min before first meal of the day  Please schedule a follow up office visit in 6 weeks, call sooner if needed - bring your inhalers with you PFT's on return    Virtual Visit via Telephone Note 09/19/2020   I connected with Patricia [name] on 09/19/20 at  9:45 AM EDT by telephone and verified that I am speaking with the correct person using two identifiers. Pt is at home and this call made from my office with no other participants    I discussed the limitations, risks, security and privacy concerns of performing an evaluation and management service by telephone and the availability of in person  appointments. I also discussed with the patient that there may be a patient responsible charge related to this service. The patient expressed understanding and agreed to proceed.   History of Present Illness: Dyspnea:  only with ex  Cough: not as bad  Sleeping: no resp symptoms SABA use: stopped symbicort 09/15/20 due to st/dysphagia and no more saba  since stopped  02: none  Prior thrush had 100% resolved p diclucan x one /clotrimazole troch    No obvious day to day or daytime variability or assoc excess/ purulent sputum or mucus plugs or hemoptysis or cp or chest tightness, subjective wheeze or overt sinus  symptoms.    Also denies any obvious fluctuation of symptoms with weather or environmental changes or other aggravating or alleviating factors except as outlined above.   Meds reviewed/ med reconciliation completed     No outpatient medications have been marked as taking for the 09/19/20 encounter (Appointment) with Tanda Rockers, MD.         Observations/Objective: Good voice texture, no conversational sob or spont cough    Assessment and Plan: See problem list for active a/p's   Follow Up Instructions: See avs for instructions unique to this ov which includes revised/ updated  med list     I discussed the assessment and treatment plan with the patient. The patient was provided an opportunity to ask questions and all were answered. The patient agreed with the plan and demonstrated an understanding of the instructions.   The patient was advised to call back or seek an in-person evaluation if the symptoms worsen or if the condition fails to improve as anticipated.  I provided 24 minutes of non-face-to-face time during this encounter.   Christinia Gully, MD      Past Medical History:  Diagnosis Date   Anxiety    GERD (gastroesophageal reflux disease)    Hyperlipidemia    Hypertension    IBS (irritable bowel syndrome)    Insomnia    Low back pain    TIA (transient  ischemic attack) 10/2017   no deficits   Vitamin B12 deficiency    Vitamin D deficiency

## 2020-09-19 NOTE — Telephone Encounter (Signed)
Convert to televisit

## 2020-09-19 NOTE — Patient Instructions (Addendum)
For yeast infections >  diflucan 100 mg as needed   Hold off on using the symbicort for now - if breathing getting worse ok to resume one puff twice daily   Keep follow up visit planned - call sooner if needed

## 2020-09-30 ENCOUNTER — Inpatient Hospital Stay (HOSPITAL_BASED_OUTPATIENT_CLINIC_OR_DEPARTMENT_OTHER)
Admission: EM | Admit: 2020-09-30 | Discharge: 2020-10-05 | DRG: 312 | Disposition: A | Discharge status: Home / Self Care | Payer: Medicare HMO | Attending: Family Medicine | Admitting: Family Medicine

## 2020-09-30 ENCOUNTER — Other Ambulatory Visit: Payer: Self-pay

## 2020-09-30 ENCOUNTER — Emergency Department (HOSPITAL_BASED_OUTPATIENT_CLINIC_OR_DEPARTMENT_OTHER): Payer: Medicare HMO

## 2020-09-30 ENCOUNTER — Telehealth: Payer: Self-pay | Admitting: Cardiology

## 2020-09-30 ENCOUNTER — Encounter (HOSPITAL_BASED_OUTPATIENT_CLINIC_OR_DEPARTMENT_OTHER): Payer: Self-pay

## 2020-09-30 DIAGNOSIS — J45909 Unspecified asthma, uncomplicated: Secondary | ICD-10-CM | POA: Diagnosis present

## 2020-09-30 DIAGNOSIS — Z79891 Long term (current) use of opiate analgesic: Secondary | ICD-10-CM

## 2020-09-30 DIAGNOSIS — Z7951 Long term (current) use of inhaled steroids: Secondary | ICD-10-CM | POA: Diagnosis not present

## 2020-09-30 DIAGNOSIS — I480 Paroxysmal atrial fibrillation: Secondary | ICD-10-CM | POA: Diagnosis present

## 2020-09-30 DIAGNOSIS — Z79899 Other long term (current) drug therapy: Secondary | ICD-10-CM | POA: Diagnosis not present

## 2020-09-30 DIAGNOSIS — K580 Irritable bowel syndrome with diarrhea: Secondary | ICD-10-CM | POA: Diagnosis present

## 2020-09-30 DIAGNOSIS — Z881 Allergy status to other antibiotic agents status: Secondary | ICD-10-CM

## 2020-09-30 DIAGNOSIS — Z7901 Long term (current) use of anticoagulants: Secondary | ICD-10-CM | POA: Diagnosis not present

## 2020-09-30 DIAGNOSIS — E861 Hypovolemia: Secondary | ICD-10-CM | POA: Diagnosis present

## 2020-09-30 DIAGNOSIS — I10 Essential (primary) hypertension: Secondary | ICD-10-CM | POA: Diagnosis not present

## 2020-09-30 DIAGNOSIS — G8929 Other chronic pain: Secondary | ICD-10-CM | POA: Diagnosis present

## 2020-09-30 DIAGNOSIS — M542 Cervicalgia: Secondary | ICD-10-CM | POA: Diagnosis not present

## 2020-09-30 DIAGNOSIS — Y92002 Bathroom of unspecified non-institutional (private) residence single-family (private) house as the place of occurrence of the external cause: Secondary | ICD-10-CM

## 2020-09-30 DIAGNOSIS — F419 Anxiety disorder, unspecified: Secondary | ICD-10-CM | POA: Diagnosis present

## 2020-09-30 DIAGNOSIS — I471 Supraventricular tachycardia: Secondary | ICD-10-CM | POA: Diagnosis not present

## 2020-09-30 DIAGNOSIS — Z885 Allergy status to narcotic agent status: Secondary | ICD-10-CM

## 2020-09-30 DIAGNOSIS — E559 Vitamin D deficiency, unspecified: Secondary | ICD-10-CM | POA: Diagnosis present

## 2020-09-30 DIAGNOSIS — E860 Dehydration: Secondary | ICD-10-CM | POA: Diagnosis not present

## 2020-09-30 DIAGNOSIS — E669 Obesity, unspecified: Secondary | ICD-10-CM | POA: Diagnosis not present

## 2020-09-30 DIAGNOSIS — E785 Hyperlipidemia, unspecified: Secondary | ICD-10-CM | POA: Diagnosis present

## 2020-09-30 DIAGNOSIS — K219 Gastro-esophageal reflux disease without esophagitis: Secondary | ICD-10-CM | POA: Diagnosis present

## 2020-09-30 DIAGNOSIS — Z6828 Body mass index (BMI) 28.0-28.9, adult: Secondary | ICD-10-CM

## 2020-09-30 DIAGNOSIS — R55 Syncope and collapse: Principal | ICD-10-CM | POA: Diagnosis present

## 2020-09-30 DIAGNOSIS — W19XXXA Unspecified fall, initial encounter: Secondary | ICD-10-CM | POA: Diagnosis present

## 2020-09-30 DIAGNOSIS — R059 Cough, unspecified: Secondary | ICD-10-CM | POA: Diagnosis not present

## 2020-09-30 DIAGNOSIS — G4700 Insomnia, unspecified: Secondary | ICD-10-CM | POA: Diagnosis present

## 2020-09-30 DIAGNOSIS — E871 Hypo-osmolality and hyponatremia: Secondary | ICD-10-CM | POA: Diagnosis not present

## 2020-09-30 DIAGNOSIS — Z87891 Personal history of nicotine dependence: Secondary | ICD-10-CM

## 2020-09-30 DIAGNOSIS — Z20822 Contact with and (suspected) exposure to covid-19: Secondary | ICD-10-CM | POA: Diagnosis present

## 2020-09-30 DIAGNOSIS — Z8673 Personal history of transient ischemic attack (TIA), and cerebral infarction without residual deficits: Secondary | ICD-10-CM | POA: Diagnosis not present

## 2020-09-30 DIAGNOSIS — Z888 Allergy status to other drugs, medicaments and biological substances status: Secondary | ICD-10-CM

## 2020-09-30 DIAGNOSIS — S069X9A Unspecified intracranial injury with loss of consciousness of unspecified duration, initial encounter: Secondary | ICD-10-CM | POA: Diagnosis not present

## 2020-09-30 LAB — CBC
HCT: 40.3 % (ref 36.0–46.0)
Hemoglobin: 14.4 g/dL (ref 12.0–15.0)
MCH: 34.3 pg — ABNORMAL HIGH (ref 26.0–34.0)
MCHC: 35.7 g/dL (ref 30.0–36.0)
MCV: 96 fL (ref 80.0–100.0)
Platelets: 287 10*3/uL (ref 150–400)
RBC: 4.2 MIL/uL (ref 3.87–5.11)
RDW: 13.3 % (ref 11.5–15.5)
WBC: 6.6 10*3/uL (ref 4.0–10.5)
nRBC: 0 % (ref 0.0–0.2)

## 2020-09-30 LAB — RESP PANEL BY RT-PCR (FLU A&B, COVID) ARPGX2
Influenza A by PCR: NEGATIVE
Influenza B by PCR: NEGATIVE
SARS Coronavirus 2 by RT PCR: NEGATIVE

## 2020-09-30 LAB — BASIC METABOLIC PANEL
Anion gap: 8 (ref 5–15)
BUN: 13 mg/dL (ref 8–23)
CO2: 27 mmol/L (ref 22–32)
Calcium: 9.5 mg/dL (ref 8.9–10.3)
Chloride: 91 mmol/L — ABNORMAL LOW (ref 98–111)
Creatinine, Ser: 0.68 mg/dL (ref 0.44–1.00)
GFR, Estimated: >60 mL/min (ref >60)
Glucose, Bld: 84 mg/dL (ref 70–99)
Potassium: 4.3 mmol/L (ref 3.5–5.1)
Sodium: 126 mmol/L — ABNORMAL LOW (ref 135–145)

## 2020-09-30 LAB — TROPONIN I (HIGH SENSITIVITY)
Troponin I (High Sensitivity): 3 ng/L (ref <18)
Troponin I (High Sensitivity): 4 ng/L (ref <18)

## 2020-09-30 MED ORDER — AEROCHAMBER PLUS FLO-VU LARGE MISC
1.0000 | Freq: Once | Status: AC
  Administered 2020-09-30: 1
  Filled 2020-09-30: qty 1

## 2020-09-30 MED ORDER — SODIUM CHLORIDE 0.9 % IV BOLUS
1000.0000 mL | Freq: Once | INTRAVENOUS | Status: AC
  Administered 2020-09-30: 1000 mL via INTRAVENOUS

## 2020-09-30 MED ORDER — ALBUTEROL SULFATE HFA 108 (90 BASE) MCG/ACT IN AERS
2.0000 | INHALATION_SPRAY | Freq: Four times a day (QID) | RESPIRATORY_TRACT | Status: DC | PRN
  Administered 2020-09-30 – 2020-10-05 (×7): 2 via RESPIRATORY_TRACT
  Filled 2020-09-30: qty 6.7

## 2020-09-30 NOTE — Telephone Encounter (Signed)
Received call transferred directly from operator and spoke with patient.  She reports she passed out about 3:50 today.  States she "was out cold" and her husband found her on the bathroom floor.  She has been feeling bad all day.  Felt like she was in slow motion and couldn't take a big step.  Had stroke in the past but she is not having symptoms like she had at that time.  No one sided weakness, no speech difficulty.  She does not know if she hit her head when she passed out.  I advised patient she should be evaluated in the ED due to passing out and being on Eliquis.  She will go to ED at [facility]. Husband will drive her

## 2020-09-30 NOTE — ED Notes (Addendum)
RT educated pt on proper use of MDI/spacer. Pt familiar w/MDI but has never used a spacer. RT explained process and pt able to perform w/out difficulty. Pt respiratory status stable w/no distress noted at this time. Pt being transferred to [facility] via [facility] at this time w/inhaler and spacer in hand.

## 2020-09-30 NOTE — ED Notes (Signed)
Report/Care handoff provided to [facility] at this Time.

## 2020-09-30 NOTE — ED Notes (Signed)
Patient transported to CT at this time. 

## 2020-09-30 NOTE — ED Notes (Signed)
[facility] at Bedside for Transport

## 2020-09-30 NOTE — ED Notes (Signed)
Report/Care Handoff given to [place], RN at this Time. All Questions answered.

## 2020-09-30 NOTE — ED Triage Notes (Signed)
Pt presents POV, states she passed out earlier today in her bathroom at home.  Denies any injuries or hitting her head.  States she takes Eliquis.

## 2020-09-30 NOTE — ED Notes (Signed)
Patient returned from CT

## 2020-09-30 NOTE — Telephone Encounter (Signed)
Pt c/o BP issue: STAT if pt c/o blurred vision, one-sided weakness or slurred speech  1. What are your last 5 BP readings?  09/30/20 138/127 HR 95 (10 min ago)    154/90 HR 95 (9 min ago)    148/95 HR 120 ( 5 min ago)     145/111 HR 81 (now)  2. Are you having any other symptoms (ex. Dizziness, headache, blurred vision, passed out)? Patient passed out on the bathroom floor at about 3:50 pm  3. What is your BP issue? Patient said she did not know she did not feel good before she passed out. She did not think that she was going to pass out, but her Husband found her on her bathroom floor. Her husband checked her blood sugar and it was 102   Pt c/o Syncope: STAT if syncope occurred within 30 minutes and pt complains of lightheadedness High Priority if episode of passing out, completely, today or in last 24 hours   Did you pass out today? yes  When is the last time you passed out?in December before she saw Dr. Curt Bears  Has this occurred multiple times? no  Did you have any symptoms prior to passing out? Patient just said she just didn't feel well today

## 2020-09-30 NOTE — ED Notes (Signed)
Called [facility] to transport patient to [facility] [phone]

## 2020-09-30 NOTE — ED Notes (Signed)
Patient made aware of Hospital Transfer; Patient consented to Transfer and Signed Documentation.

## 2020-09-30 NOTE — ED Provider Notes (Signed)
[facility]ERGENCY DEPT Provider Note   CSN: [id number] Arrival date & time: 09/30/20  1731     History Chief Complaint  Patient presents with   Loss of Consciousness    Patricia [name] is a 70 y.o. female.  Patient presents ER chief complaint of a syncopal episode.  Patient states that she was out all day doing her shopping.  She states that she felt "off," she denies any prior headache or chest pain no reports of fevers or cough or vomiting or diarrhea.  She finished her shopping and come home and went to the bathroom to try to attach something to the wall.  She was standing up opening a package when the next and she knew she was waking up on the ground.  She denies any headache or chest pain or any other extremity pain.  Complaining of generalized fatigue and weakness.      Past Medical History:  Diagnosis Date   Anxiety    GERD (gastroesophageal reflux disease)    Hyperlipidemia    Hypertension    IBS (irritable bowel syndrome)    Insomnia    Low back pain    TIA (transient ischemic attack) 10/2017   no deficits   Vitamin B12 deficiency    Vitamin D deficiency     Patient Active Problem List   Diagnosis Date Noted   Asthmatic bronchitis 08/11/2020   Colles' fracture of right radius, initial encounter for closed fracture 11/19/2019    Past Surgical History:  Procedure Laterality Date   BACK SURGERY     BREAST EXCISIONAL BIOPSY Left 1997   benign   hemorrhoidecotmy     HERNIA MESH REMOVAL     OPEN REDUCTION INTERNAL FIXATION (ORIF) DISTAL RADIAL FRACTURE Right 11/19/2019   Procedure: OPEN REDUCTION INTERNAL FIXATION (ORIF) DISTAL RADIUS AND ULNA FRACTURE WITH REPAIR AS NECESSARY;  Surgeon: Roseanne Kaufman, MD;  Location: [facility];  Service: Orthopedics;  Laterality: Right;  2 hrs Block with IV sedation   ORIF RADIUS & ULNA FRACTURES     TONSILLECTOMY       OB History   No obstetric history on file.     Family History  Problem  Relation Age of Onset   Breast cancer Sister    Breast cancer Maternal Aunt    Breast cancer Paternal Aunt     Social History   Tobacco Use   Smoking status: Former    Packs/day: 1.00    Years: 50.00    Pack years: 50.00    Types: Cigarettes    Start date: 57    Quit date: 12/14/2019    Years since quitting: 0.7   Smokeless tobacco: Never  Vaping Use   Vaping Use: Never used  Substance Use Topics   Alcohol use: Yes    Comment: occ once every 2 months   Drug use: Never    Home Medications Prior to Admission medications   Medication Sig Start Date End Date Taking? Authorizing Provider  apixaban (ELIQUIS) 5 MG TABS tablet Take 1 tablet (5 mg total) by mouth 2 (two) times daily. 02/11/20  Yes Camnitz, Will Hassell Done, MD  Black Cohosh (REMIFEMIN PO) Take 1 tablet by mouth in the morning and at bedtime.   Yes [provider]  budesonide-formoterol (SYMBICORT) 80-4.5 MCG/ACT inhaler Take 2 puffs first thing in am and then another 2 puffs about 12 hours later. 08/11/20  Yes Tanda Rockers, MD  carboxymethylcellulose (REFRESH PLUS) 0.5 % SOLN Place  1 drop into both eyes 3 (three) times daily as needed (for dryness).    Yes [provider]  Cholecalciferol (VITAMIN D3) 50 MCG (2000 UT) TABS Take 2,000 Units by mouth daily.   Yes [provider]  clobetasol ointment (TEMOVATE) 1.76 % Apply 1 application topically See admin instructions. Apply to vaginal area daily as directed   Yes [provider]  clonazePAM (KLONOPIN) 1 MG tablet Take 1 mg by mouth at bedtime.  10/15/17  Yes [provider]  fluconazole (DIFLUCAN) 100 MG tablet Take 1 tablet (100 mg total) by mouth daily. 09/19/20  Yes Tanda Rockers, MD  fluticasone (FLONASE) 50 MCG/ACT nasal spray Place 2 sprays into both nostrils daily as needed for allergies or rhinitis.  10/11/17  Yes [provider]  gabapentin (NEURONTIN) 600 MG tablet Take 600 mg by mouth See admin instructions. Take  600 mg by mouth in the morning and at lunchtime 09/16/17  Yes [provider]  hydrOXYzine (ATARAX/VISTARIL) 10 MG tablet Take 10 mg by mouth at bedtime as needed (for sleep).   Yes [provider]  hyoscyamine (LEVSIN) 0.125 MG tablet Take 0.125 mg by mouth as needed. 02/06/10  Yes [provider]  LINZESS 290 MCG CAPS capsule Take 290 mcg by mouth daily at 4 PM.  09/13/17  Yes [provider]  losartan (COZAAR) 100 MG tablet Take 100 mg by mouth daily.  08/27/17  Yes [provider]  omeprazole (PRILOSEC) 20 MG capsule Take 20 mg by mouth daily before breakfast.  08/27/17  Yes [provider]  polyethylene glycol (MIRALAX / GLYCOLAX) packet Take 34 g by mouth in the morning.    Yes [provider]  traMADol (ULTRAM) 50 MG tablet Take 100 mg by mouth as needed. 10/08/19  Yes [provider]  VENTOLIN HFA 108 (90 Base) MCG/ACT inhaler Inhale 2 puffs into the lungs every 6 (six) hours as needed for wheezing or shortness of breath.  09/30/17  Yes [provider]  zaleplon (SONATA) 10 MG capsule Take 10 mg by mouth at bedtime as needed (for interrupted sleep).    Yes [provider]  acetaminophen (TYLENOL) 325 MG tablet 1 tablet as needed.    [provider]  carisoprodol (SOMA) 350 MG tablet Take 350 mg by mouth 3 (three) times daily.  09/26/17   [provider]  cefdinir (OMNICEF) 300 MG capsule Take 1 capsule (300 mg total) by mouth 2 (two) times daily. Patient not taking: Reported on 09/30/2020 08/11/20   Tanda Rockers, MD  clotrimazole (MYCELEX) 10 MG troche Take 1 tablet (10 mg total) by mouth 4 (four) times daily as needed. 09/05/20   Tanda Rockers, MD  metoprolol tartrate (LOPRESSOR) 25 MG tablet Take 0.5 tablets (12.5 mg total) by mouth 2 (two) times daily. 12/14/19 01/13/20  Noemi Chapel, MD  ondansetron (ZOFRAN) 4 MG tablet Take 1 tablet (4 mg total) by mouth every 8 (eight) hours as needed for  nausea or vomiting. Patient not taking: Reported on 09/30/2020 11/17/19 11/16/20  Roseanne Kaufman, MD  rosuvastatin (CRESTOR) 5 MG tablet Take 5 mg by mouth daily.    [provider]    Allergies    Augmentin [amoxicillin-pot clavulanate], Levaquin [levofloxacin in d5w], Mom [magnesium hydroxide], and Oxycodone  Review of Systems   Review of Systems  Constitutional:  Negative for fever.  HENT:  Negative for ear pain.   Eyes:  Negative for pain.  Respiratory:  Negative for  cough.   Cardiovascular:  Negative for chest pain.  Gastrointestinal:  Negative for abdominal pain.  Genitourinary:  Negative for flank pain.  Musculoskeletal:  Negative for back pain.  Skin:  Negative for rash.  Neurological:  Negative for headaches.   Physical Exam Updated Vital Signs BP (!) 163/95 (BP Location: Right Arm)   Pulse 63   Temp 98.7 F (37.1 C)   Resp 18   LMP  (LMP Unknown)   SpO2 100%   Physical Exam Constitutional:      General: She is not in acute distress.    Appearance: Normal appearance.  HENT:     Head: Normocephalic.     Nose: Nose normal.  Eyes:     Extraocular Movements: Extraocular movements intact.  Cardiovascular:     Rate and Rhythm: Normal rate.  Pulmonary:     Effort: Pulmonary effort is normal.  Musculoskeletal:        General: Normal range of motion.     Cervical back: Normal range of motion.  Neurological:     General: No focal deficit present.     Mental Status: She is alert. Mental status is at baseline.    ED Results / Procedures / Treatments   Labs (all labs ordered are listed, but only abnormal results are displayed) Labs Reviewed  BASIC METABOLIC PANEL - Abnormal; Notable for the following components:      Result Value   Sodium 126 (*)    Chloride 91 (*)    All other components within normal limits  CBC - Abnormal; Notable for the following components:   MCH 34.3 (*)    All other components within normal limits  RESP PANEL BY RT-PCR (FLU  A&B, COVID) ARPGX2  TROPONIN I (HIGH SENSITIVITY)  TROPONIN I (HIGH SENSITIVITY)    EKG None  Radiology CT Head Wo Contrast  Result Date: 09/30/2020 CLINICAL DATA:  Head trauma after loss of consciousness. EXAM: CT HEAD WITHOUT CONTRAST TECHNIQUE: Contiguous axial images were obtained from the base of the skull through the vertex without intravenous contrast. COMPARISON:  November 07, 2008. FINDINGS: Brain: No evidence of acute infarction, hemorrhage, hydrocephalus, extra-axial collection or mass lesion/mass effect. Vascular: No hyperdense vessel or unexpected calcification. Skull: Normal. Negative for fracture or focal lesion. Sinuses/Orbits: No acute finding. Other: None. IMPRESSION: No acute intracranial abnormality seen. Electronically Signed   By: Marijo Conception M.D.   On: 09/30/2020 20:27   DG Chest Port 1 View  Result Date: 09/30/2020 CLINICAL DATA:  Cough.  Patient reports syncope today. EXAM: PORTABLE CHEST 1 VIEW COMPARISON:  Chest radiograph 06/29/2020 FINDINGS: The cardiomediastinal contours are normal. Atherosclerosis of the aortic arch. Borderline hyperinflation which is similar to prior exam. Pulmonary vasculature is normal. No consolidation, pleural effusion, or pneumothorax. No acute osseous abnormalities are seen. IMPRESSION: No acute chest findings.  Stable borderline hyperinflation. Electronically Signed   By: Keith Rake M.D.   On: 09/30/2020 20:19    Procedures Procedures   Medications Ordered in ED Medications  sodium chloride 0.9 % bolus 1,000 mL (1,000 mLs Intravenous New Bag/Given 09/30/20 2013)    ED Course  I have reviewed the triage vital signs and the nursing notes.  Pertinent labs & imaging results that were available during my care of the patient were reviewed by me and considered in my medical decision making (see chart for details).    MDM Rules/Calculators/A&P  Labs show sodium of 126.  Chemistry otherwise unremarkable  troponin is negative.  EKG shows sinus rhythm no acute changes noted.  CT scan of the brain is unremarkable any acute findings.  Given the patient's advanced age and not any clear cause, will be brought in for further observation.  Patient given a liter bolus of normal saline hydration.   Final Clinical Impression(s) / ED Diagnoses Final diagnoses:  Syncope, unspecified syncope type  Hyponatremia    Rx / DC Orders ED Discharge Orders     None        Luna Fuse, MD 09/30/20 2054

## 2020-09-30 NOTE — ED Notes (Signed)
RT at Bedside for Albuterol Treatment.

## 2020-10-01 ENCOUNTER — Other Ambulatory Visit: Payer: Self-pay | Admitting: Cardiology

## 2020-10-01 ENCOUNTER — Inpatient Hospital Stay (HOSPITAL_COMMUNITY): Payer: Medicare HMO

## 2020-10-01 ENCOUNTER — Encounter (HOSPITAL_COMMUNITY): Payer: Self-pay | Admitting: Internal Medicine

## 2020-10-01 ENCOUNTER — Observation Stay (HOSPITAL_COMMUNITY): Payer: Medicare HMO

## 2020-10-01 DIAGNOSIS — Z20822 Contact with and (suspected) exposure to covid-19: Secondary | ICD-10-CM | POA: Diagnosis present

## 2020-10-01 DIAGNOSIS — E871 Hypo-osmolality and hyponatremia: Secondary | ICD-10-CM

## 2020-10-01 DIAGNOSIS — Y92002 Bathroom of unspecified non-institutional (private) residence single-family (private) house as the place of occurrence of the external cause: Secondary | ICD-10-CM | POA: Diagnosis not present

## 2020-10-01 DIAGNOSIS — Z7901 Long term (current) use of anticoagulants: Secondary | ICD-10-CM | POA: Diagnosis not present

## 2020-10-01 DIAGNOSIS — E559 Vitamin D deficiency, unspecified: Secondary | ICD-10-CM | POA: Diagnosis present

## 2020-10-01 DIAGNOSIS — R55 Syncope and collapse: Secondary | ICD-10-CM | POA: Diagnosis present

## 2020-10-01 DIAGNOSIS — I471 Supraventricular tachycardia: Secondary | ICD-10-CM | POA: Diagnosis present

## 2020-10-01 DIAGNOSIS — K219 Gastro-esophageal reflux disease without esophagitis: Secondary | ICD-10-CM | POA: Diagnosis present

## 2020-10-01 DIAGNOSIS — G4700 Insomnia, unspecified: Secondary | ICD-10-CM | POA: Diagnosis present

## 2020-10-01 DIAGNOSIS — E669 Obesity, unspecified: Secondary | ICD-10-CM | POA: Diagnosis present

## 2020-10-01 DIAGNOSIS — W19XXXA Unspecified fall, initial encounter: Secondary | ICD-10-CM | POA: Diagnosis present

## 2020-10-01 DIAGNOSIS — I10 Essential (primary) hypertension: Secondary | ICD-10-CM | POA: Diagnosis present

## 2020-10-01 DIAGNOSIS — F419 Anxiety disorder, unspecified: Secondary | ICD-10-CM | POA: Diagnosis present

## 2020-10-01 DIAGNOSIS — Z87891 Personal history of nicotine dependence: Secondary | ICD-10-CM | POA: Diagnosis not present

## 2020-10-01 DIAGNOSIS — I480 Paroxysmal atrial fibrillation: Secondary | ICD-10-CM | POA: Diagnosis present

## 2020-10-01 DIAGNOSIS — Z79899 Other long term (current) drug therapy: Secondary | ICD-10-CM | POA: Diagnosis not present

## 2020-10-01 DIAGNOSIS — J45909 Unspecified asthma, uncomplicated: Secondary | ICD-10-CM | POA: Diagnosis present

## 2020-10-01 DIAGNOSIS — Z7951 Long term (current) use of inhaled steroids: Secondary | ICD-10-CM | POA: Diagnosis not present

## 2020-10-01 DIAGNOSIS — Z8673 Personal history of transient ischemic attack (TIA), and cerebral infarction without residual deficits: Secondary | ICD-10-CM | POA: Diagnosis not present

## 2020-10-01 DIAGNOSIS — G8929 Other chronic pain: Secondary | ICD-10-CM | POA: Diagnosis present

## 2020-10-01 DIAGNOSIS — E861 Hypovolemia: Secondary | ICD-10-CM | POA: Diagnosis present

## 2020-10-01 DIAGNOSIS — Z6828 Body mass index (BMI) 28.0-28.9, adult: Secondary | ICD-10-CM | POA: Diagnosis not present

## 2020-10-01 DIAGNOSIS — K580 Irritable bowel syndrome with diarrhea: Secondary | ICD-10-CM | POA: Diagnosis present

## 2020-10-01 DIAGNOSIS — Z79891 Long term (current) use of opiate analgesic: Secondary | ICD-10-CM | POA: Diagnosis not present

## 2020-10-01 DIAGNOSIS — E860 Dehydration: Secondary | ICD-10-CM | POA: Diagnosis present

## 2020-10-01 DIAGNOSIS — E785 Hyperlipidemia, unspecified: Secondary | ICD-10-CM | POA: Diagnosis present

## 2020-10-01 LAB — BASIC METABOLIC PANEL
Anion gap: 7 (ref 5–15)
BUN: 10 mg/dL (ref 8–23)
CO2: 26 mmol/L (ref 22–32)
Calcium: 9 mg/dL (ref 8.9–10.3)
Chloride: 98 mmol/L (ref 98–111)
Creatinine, Ser: 0.51 mg/dL (ref 0.44–1.00)
GFR, Estimated: >60 mL/min (ref >60)
Glucose, Bld: 90 mg/dL (ref 70–99)
Potassium: 4 mmol/L (ref 3.5–5.1)
Sodium: 131 mmol/L — ABNORMAL LOW (ref 135–145)

## 2020-10-01 LAB — MAGNESIUM: Magnesium: 1.6 mg/dL — ABNORMAL LOW (ref 1.7–2.4)

## 2020-10-01 LAB — ECHOCARDIOGRAM COMPLETE
Area-P 1/2: 4.33 cm2
Height: 67 in
S' Lateral: 2.8 cm
Weight: 2913.6 oz

## 2020-10-01 LAB — TSH: TSH: 1.037 u[IU]/mL (ref 0.350–4.500)

## 2020-10-01 MED ORDER — VITAMIN D 25 MCG (1000 UNIT) PO TABS
2000.0000 [IU] | ORAL_TABLET | Freq: Every day | ORAL | Status: DC
  Administered 2020-10-01 – 2020-10-05 (×5): 2000 [IU] via ORAL
  Filled 2020-10-01 (×5): qty 2

## 2020-10-01 MED ORDER — METOPROLOL TARTRATE 25 MG PO TABS
25.0000 mg | ORAL_TABLET | Freq: Two times a day (BID) | ORAL | Status: DC
  Administered 2020-10-01: 25 mg via ORAL

## 2020-10-01 MED ORDER — LOSARTAN POTASSIUM 50 MG PO TABS
100.0000 mg | ORAL_TABLET | Freq: Every day | ORAL | Status: DC
  Administered 2020-10-01 – 2020-10-02 (×2): 100 mg via ORAL
  Filled 2020-10-01 (×3): qty 2

## 2020-10-01 MED ORDER — GABAPENTIN 300 MG PO CAPS
600.0000 mg | ORAL_CAPSULE | Freq: Two times a day (BID) | ORAL | Status: DC
  Administered 2020-10-01 – 2020-10-05 (×10): 600 mg via ORAL
  Filled 2020-10-01 (×10): qty 2

## 2020-10-01 MED ORDER — ACETAMINOPHEN 650 MG RE SUPP: 650.0000 mg | Freq: Four times a day (QID) | RECTAL | Status: DC | PRN

## 2020-10-01 MED ORDER — APIXABAN 5 MG PO TABS
5.0000 mg | ORAL_TABLET | Freq: Two times a day (BID) | ORAL | Status: DC
  Administered 2020-10-01 – 2020-10-05 (×10): 5 mg via ORAL
  Filled 2020-10-01 (×10): qty 1

## 2020-10-01 MED ORDER — LACTATED RINGERS IV SOLN: INTRAVENOUS | Status: DC

## 2020-10-01 MED ORDER — TRAMADOL HCL 50 MG PO TABS
100.0000 mg | ORAL_TABLET | Freq: Two times a day (BID) | ORAL | Status: DC | PRN
  Administered 2020-10-01 – 2020-10-05 (×6): 100 mg via ORAL
  Filled 2020-10-01 (×6): qty 2

## 2020-10-01 MED ORDER — METOPROLOL TARTRATE 25 MG PO TABS
12.5000 mg | ORAL_TABLET | Freq: Two times a day (BID) | ORAL | Status: DC
  Administered 2020-10-01: 12.5 mg via ORAL
  Filled 2020-10-01 (×2): qty 1

## 2020-10-01 MED ORDER — HYOSCYAMINE SULFATE 0.125 MG PO TBDP
0.1250 mg | ORAL_TABLET | Freq: Four times a day (QID) | ORAL | Status: DC | PRN
  Filled 2020-10-01: qty 1

## 2020-10-01 MED ORDER — MOMETASONE FURO-FORMOTEROL FUM 100-5 MCG/ACT IN AERO
2.0000 | INHALATION_SPRAY | Freq: Two times a day (BID) | RESPIRATORY_TRACT | Status: DC
  Administered 2020-10-01 – 2020-10-05 (×8): 2 via RESPIRATORY_TRACT
  Filled 2020-10-01: qty 8.8

## 2020-10-01 MED ORDER — METOPROLOL TARTRATE 25 MG PO TABS
25.0000 mg | ORAL_TABLET | Freq: Three times a day (TID) | ORAL | Status: DC
  Administered 2020-10-01 (×2): 25 mg via ORAL
  Filled 2020-10-01 (×2): qty 1

## 2020-10-01 MED ORDER — CARISOPRODOL 350 MG PO TABS: 350.0000 mg | ORAL_TABLET | Freq: Three times a day (TID) | ORAL | Status: DC

## 2020-10-01 MED ORDER — MAGNESIUM SULFATE 4 GM/100ML IV SOLN
4.0000 g | Freq: Once | INTRAVENOUS | Status: AC
  Administered 2020-10-01: 4 g via INTRAVENOUS
  Filled 2020-10-01: qty 100

## 2020-10-01 MED ORDER — CLONAZEPAM 1 MG PO TABS
1.0000 mg | ORAL_TABLET | Freq: Every day | ORAL | Status: DC
  Administered 2020-10-01 – 2020-10-04 (×5): 1 mg via ORAL
  Filled 2020-10-01 (×5): qty 1

## 2020-10-01 MED ORDER — FLUTICASONE PROPIONATE 50 MCG/ACT NA SUSP
2.0000 | Freq: Every day | NASAL | Status: DC | PRN
  Filled 2020-10-01: qty 16

## 2020-10-01 MED ORDER — ROSUVASTATIN CALCIUM 5 MG PO TABS
5.0000 mg | ORAL_TABLET | ORAL | Status: DC
  Administered 2020-10-03 – 2020-10-05 (×2): 5 mg via ORAL
  Filled 2020-10-01 (×3): qty 1

## 2020-10-01 MED ORDER — POLYETHYLENE GLYCOL 3350 17 G PO PACK: 34.0000 g | PACK | Freq: Every morning | ORAL | Status: DC

## 2020-10-01 MED ORDER — PANTOPRAZOLE SODIUM 40 MG PO TBEC
40.0000 mg | DELAYED_RELEASE_TABLET | Freq: Every day | ORAL | Status: DC
  Administered 2020-10-01 – 2020-10-05 (×5): 40 mg via ORAL
  Filled 2020-10-01 (×5): qty 1

## 2020-10-01 MED ORDER — ALBUTEROL SULFATE (2.5 MG/3ML) 0.083% IN NEBU: 3.0000 mL | INHALATION_SOLUTION | Freq: Four times a day (QID) | RESPIRATORY_TRACT | Status: DC | PRN

## 2020-10-01 MED ORDER — LINACLOTIDE 145 MCG PO CAPS
290.0000 ug | ORAL_CAPSULE | Freq: Every day | ORAL | Status: DC
  Filled 2020-10-01 (×2): qty 2

## 2020-10-01 MED ORDER — ACETAMINOPHEN 325 MG PO TABS
650.0000 mg | ORAL_TABLET | Freq: Four times a day (QID) | ORAL | Status: DC | PRN
  Administered 2020-10-01: 650 mg via ORAL
  Filled 2020-10-01: qty 2

## 2020-10-01 MED ORDER — POLYVINYL ALCOHOL 1.4 % OP SOLN
1.0000 [drp] | Freq: Three times a day (TID) | OPHTHALMIC | Status: DC | PRN
  Filled 2020-10-01: qty 15

## 2020-10-01 NOTE — Plan of Care (Signed)

## 2020-10-01 NOTE — Progress Notes (Signed)
   10/01/20 0125  Orthostatic Lying   BP- Lying (!) 140/96  Pulse- Lying 84  Orthostatic Sitting  BP- Sitting 162/81 (Taken @ 0128)  Pulse- Sitting 81  Orthostatic Standing at 0 minutes  BP- Standing at 0 minutes (!) 140/98 (Taken @ 0130)  Pulse- Standing at 0 minutes 88  Orthostatic Standing at 3 minutes  BP- Standing at 3 minutes (!) 155/108 (Taken @ 0133)  Pulse- Standing at 3 minutes 153   Notified on call attending about patient's orthostatic vitals. On call attending had nurse do a 12-lead EKG on patient due to the patient's elevated heart rate. Patient's EKG showed normal sinus rhythm with normal EKG. EKG was placed in patient's shadow chart. Patient does have a history of A. Fib.

## 2020-10-01 NOTE — Plan of Care (Signed)

## 2020-10-01 NOTE — H&P (Signed)
History and Physical    Patricia [name] JME:[id number] DOB: 12/24/1950 DOA: 09/30/2020  PCP: [name], MD   Patient coming from: Home  Chief Complaint: Syncope  HPI: Patricia [name] is a 70 y.o. female with medical history significant for HTN, HLD, hx of TIA/CVAs, IBS, anxiety who presents after a syncopal episode at home.  She states she was out doing errands all day and did not drink or eat very much.  After she got home she went to plug in a air freshener into the bathroom and while she was opening the package she has began to feel "off" and the next thing she knows she was laying on the floor.  Her husband was in the kitchen putting the groceries when he heard her fall and came to check on her.  There was no mention of any seizure type activity.  When asked her what she was doing the floor she opened her eyes and said just resting.  She denies any headache at this time.  States she has never had syncope in the past.  She has no numbness or weakness in her extremities and states that she feels at her normal baseline level.  ED Course: In the emergency room her vital signs were stable and she was found to have hyponatremia.  CT of the head was negative.  Chest x-ray was negative.  She was given IV fluids.  Hospitalist service was asked to serve overnight on telemetry to make sure no cardiac arrhythmia was the etiology.  Labs reveal sodium 126 potassium 4.3 chloride 91 bicarb 27 creatinine 0.68 BUN 13 glucose 84 CBC unremarkable troponin 3, COVID-19 swab negative.  Influenza A and B are negative  Review of Systems:  General: Denies fever, chills, weight loss, night sweats. Denies change in appetite HENT: Denies head trauma, headache, denies change in hearing, tinnitus.  Denies nasal congestion.  Denies sore throat, Denies difficulty swallowing Eyes: Denies blurry vision, pain in eye, drainage.  Denies discoloration of eyes. Neck: Denies pain.  Denies swelling.  Denies pain with  movement. Cardiovascular: Denies chest pain, palpitations.  Denies edema.  Denies orthopnea Respiratory: Denies shortness of breath, cough.  Denies wheezing.  Denies sputum production Gastrointestinal: Denies abdominal pain, swelling.  Denies nausea, vomiting, diarrhea.  Denies melena.  Denies hematemesis. Musculoskeletal: Denies limitation of movement.  Denies deformity or swelling.  Denies pain.  Denies arthralgias or myalgias. Genitourinary: Denies pelvic pain.  Denies urinary frequency or hesitancy.  Denies dysuria.  Skin: Denies rash.  Denies petechiae, purpura, ecchymosis. Neurological: Denies seizure activity.  Denies paresthesia. Denies slurred speech, drooping face. Denies visual change. Psychiatric: Denies depression, anxiety. Denies hallucinations.  Past Medical History:  Diagnosis Date   Anxiety    GERD (gastroesophageal reflux disease)    Hyperlipidemia    Hypertension    IBS (irritable bowel syndrome)    Insomnia    Low back pain    TIA (transient ischemic attack) 10/2017   no deficits   Vitamin B12 deficiency    Vitamin D deficiency     Past Surgical History:  Procedure Laterality Date   BACK SURGERY     BREAST EXCISIONAL BIOPSY Left 1997   benign   hemorrhoidecotmy     HERNIA MESH REMOVAL     OPEN REDUCTION INTERNAL FIXATION (ORIF) DISTAL RADIAL FRACTURE Right 11/19/2019   Procedure: OPEN REDUCTION INTERNAL FIXATION (ORIF) DISTAL RADIUS AND ULNA FRACTURE WITH REPAIR AS NECESSARY;  Surgeon: Roseanne Kaufman, MD;  Location: [facility];  Service:  Orthopedics;  Laterality: Right;  2 hrs Block with IV sedation   ORIF RADIUS & ULNA FRACTURES     TONSILLECTOMY      Social History  reports that she quit smoking about 9 months ago. Her smoking use included cigarettes. She started smoking about 50 years ago. She has a 50.00 pack-year smoking history. She has never used smokeless tobacco. She reports current alcohol use. She reports that she does not use drugs.  Allergies   Allergen Reactions   Augmentin [Amoxicillin-Pot Clavulanate] Nausea Only and Other (See Comments)    GI upset   Levaquin [Levofloxacin In D5w] Other (See Comments)    Joint problems    Mom [Magnesium Hydroxide] Other (See Comments)    Welts    Oxycodone Other (See Comments)    Nausea     Family History  Problem Relation Age of Onset   Breast cancer Sister    Breast cancer Maternal Aunt    Breast cancer Paternal Aunt      Prior to Admission medications   Medication Sig Start Date End Date Taking? Authorizing Provider  apixaban (ELIQUIS) 5 MG TABS tablet Take 1 tablet (5 mg total) by mouth 2 (two) times daily. 02/11/20  Yes Camnitz, Will Hassell Done, MD  Black Cohosh (REMIFEMIN PO) Take 1 tablet by mouth in the morning and at bedtime.   Yes [provider]  budesonide-formoterol (SYMBICORT) 80-4.5 MCG/ACT inhaler Take 2 puffs first thing in am and then another 2 puffs about 12 hours later. 08/11/20  Yes Tanda Rockers, MD  carboxymethylcellulose (REFRESH PLUS) 0.5 % SOLN Place 1 drop into both eyes 3 (three) times daily as needed (for dryness).    Yes [provider]  Cholecalciferol (VITAMIN D3) 50 MCG (2000 UT) TABS Take 2,000 Units by mouth daily.   Yes [provider]  clobetasol ointment (TEMOVATE) 8.29 % Apply 1 application topically See admin instructions. Apply to vaginal area daily as directed   Yes [provider]  clonazePAM (KLONOPIN) 1 MG tablet Take 1 mg by mouth at bedtime.  10/15/17  Yes [provider]  fluconazole (DIFLUCAN) 100 MG tablet Take 1 tablet (100 mg total) by mouth daily. 09/19/20  Yes Tanda Rockers, MD  fluticasone (FLONASE) 50 MCG/ACT nasal spray Place 2 sprays into both nostrils daily as needed for allergies or rhinitis.  10/11/17  Yes [provider]  gabapentin (NEURONTIN) 600 MG tablet Take 600 mg by mouth See admin instructions. Take 600 mg by mouth in the morning and at lunchtime 09/16/17  Yes [provider]  hydrOXYzine (ATARAX/VISTARIL) 10 MG tablet Take 10 mg by mouth at bedtime as needed (for sleep).   Yes [provider]  hyoscyamine (LEVSIN) 0.125 MG tablet Take 0.125 mg by mouth as needed. 02/06/10  Yes [provider]  LINZESS 290 MCG CAPS capsule Take 290 mcg by mouth daily at 4 PM.  09/13/17  Yes [provider]  losartan (COZAAR) 100 MG tablet Take 100 mg by mouth daily.  08/27/17  Yes [provider]  omeprazole (PRILOSEC) 20 MG capsule Take 20 mg by mouth daily before breakfast.  08/27/17  Yes [provider]  polyethylene glycol (MIRALAX / GLYCOLAX) packet Take 34 g by mouth in the morning.    Yes [provider]  traMADol (ULTRAM) 50 MG tablet Take 100 mg by mouth as needed. 10/08/19  Yes [provider]  VENTOLIN HFA 108 (90 Base) MCG/ACT inhaler Inhale 2 puffs into the  lungs every 6 (six) hours as needed for wheezing or shortness of breath.  09/30/17  Yes [provider]  zaleplon (SONATA) 10 MG capsule Take 10 mg by mouth at bedtime as needed (for interrupted sleep).    Yes [provider]  acetaminophen (TYLENOL) 325 MG tablet 1 tablet as needed.    [provider]  carisoprodol (SOMA) 350 MG tablet Take 350 mg by mouth 3 (three) times daily.  09/26/17   [provider]  cefdinir (OMNICEF) 300 MG capsule Take 1 capsule (300 mg total) by mouth 2 (two) times daily. Patient not taking: Reported on 09/30/2020 08/11/20   Tanda Rockers, MD  clotrimazole (MYCELEX) 10 MG troche Take 1 tablet (10 mg total) by mouth 4 (four) times daily as needed. 09/05/20   Tanda Rockers, MD  metoprolol tartrate (LOPRESSOR) 25 MG tablet Take 0.5 tablets (12.5 mg total) by mouth 2 (two) times daily. 12/14/19 01/13/20  Noemi Chapel, MD  ondansetron (ZOFRAN) 4 MG tablet Take 1 tablet (4 mg total) by mouth every 8 (eight) hours as needed for nausea or vomiting. Patient not taking: Reported on 09/30/2020 11/17/19  11/16/20  Roseanne Kaufman, MD  rosuvastatin (CRESTOR) 5 MG tablet Take 5 mg by mouth daily.    [provider]    Physical Exam: Vitals:   09/30/20 2104 09/30/20 2253 09/30/20 2330 09/30/20 2339  BP: (!) 160/103 138/71  (!) 159/86  Pulse: 71 75  76  Resp: 18 16  18   Temp:    98.3 F (36.8 C)  TempSrc:    Oral  SpO2: 97% 98%  99%  Weight:   82.6 kg   Height:   5\' 7"  (1.702 m)     Constitutional: NAD, calm, comfortable Vitals:   09/30/20 2104 09/30/20 2253 09/30/20 2330 09/30/20 2339  BP: (!) 160/103 138/71  (!) 159/86  Pulse: 71 75  76  Resp: 18 16  18   Temp:    98.3 F (36.8 C)  TempSrc:    Oral  SpO2: 97% 98%  99%  Weight:   82.6 kg   Height:   5\' 7"  (1.702 m)    General: WDWN, Alert and oriented x3.  Eyes: EOMI, PERRL, conjunctivae normal.  Sclera nonicteric HENT:  [place]/AT, external ears normal.  Nares patent without epistasis.  Mucous membranes are dry. Posterior pharynx clear   Neck: Soft, normal range of motion, supple, no masses, Trachea midline Respiratory: clear to auscultation bilaterally, no wheezing, no crackles. Normal respiratory effort. No accessory muscle use.  Cardiovascular: Regular rate and rhythm, no murmurs / rubs / gallops. No extremity edema. 2+ pedal pulses.   Abdomen: Soft, no tenderness, nondistended, no rebound or guarding.  No masses palpated. Bowel sounds normoactive Musculoskeletal: FROM. no cyanosis. No joint deformity upper and lower extremities.  Normal muscle tone.  Skin: Warm, dry, intact no rashes, lesions, ulcers. No induration Neurologic: CN 2-12 grossly intact. Normal speech. Sensation intact, patella DTR +1 bilaterally. Strength 5/5 in all extremities.   Psychiatric: Normal judgment and insight.  Normal mood.    Labs on Admission: I have personally reviewed following labs and imaging studies  CBC: Recent Labs  Lab 09/30/20 1812  WBC 6.6  HGB 14.4  HCT 40.3  MCV 96.0  PLT 096    Basic Metabolic Panel: Recent Labs   Lab 09/30/20 1812  NA 126*  K 4.3  CL 91*  CO2 27  GLUCOSE 84  BUN 13  CREATININE 0.68  CALCIUM 9.5  GFR: Estimated Creatinine Clearance: 71.3 mL/min (by C-G formula based on SCr of 0.68 mg/dL).  Liver Function Tests: No results for input(s): AST, ALT, ALKPHOS, BILITOT, PROT, ALBUMIN in the last 168 hours.  Urine analysis: No results found for: COLORURINE, APPEARANCEUR, [place], [place], GLUCOSEU, HGBUR, BILIRUBINUR, KETONESUR, PROTEINUR, UROBILINOGEN, NITRITE, LEUKOCYTESUR  Radiological Exams on Admission: CT Head Wo Contrast  Result Date: 09/30/2020 CLINICAL DATA:  Head trauma after loss of consciousness. EXAM: CT HEAD WITHOUT CONTRAST TECHNIQUE: Contiguous axial images were obtained from the base of the skull through the vertex without intravenous contrast. COMPARISON:  November 07, 2008. FINDINGS: Brain: No evidence of acute infarction, hemorrhage, hydrocephalus, extra-axial collection or mass lesion/mass effect. Vascular: No hyperdense vessel or unexpected calcification. Skull: Normal. Negative for fracture or focal lesion. Sinuses/Orbits: No acute finding. Other: None. IMPRESSION: No acute intracranial abnormality seen. Electronically Signed   By: Marijo Conception M.D.   On: 09/30/2020 20:27   DG Chest Port 1 View  Result Date: 09/30/2020 CLINICAL DATA:  Cough.  Patient reports syncope today. EXAM: PORTABLE CHEST 1 VIEW COMPARISON:  Chest radiograph 06/29/2020 FINDINGS: The cardiomediastinal contours are normal. Atherosclerosis of the aortic arch. Borderline hyperinflation which is similar to prior exam. Pulmonary vasculature is normal. No consolidation, pleural effusion, or pneumothorax. No acute osseous abnormalities are seen. IMPRESSION: No acute chest findings.  Stable borderline hyperinflation. Electronically Signed   By: Keith Rake M.D.   On: 09/30/2020 20:19    EKG: Independently reviewed.  EKG shows atrial tachycardia with occasional PACs.  No acute ST elevation or  depression.  QTc 472  Assessment/Plan Principal Problem:   Hyponatremia Patricia [name] is placed on telemetry for observation.  Given IVF with LR at 100 ml/hr.  Check BMP in am Pt is volume depleted and was given bolus in ER.   Active Problems:   Syncope No focal neurologic symptoms and no history of seizure activity.  Monitor on telemetry Check orthostatic BP    Essential hypertension Continue home medications. Monitor BP.    DVT prophylaxis: Is on Eliquis which is continued.  Code Status:   Full Code  Family Communication:  Diagnosis and plan discussed with patient and her husband who is at bedside.  They verbalized understanding and agree with plan.  Further recommendations to follow as clinical indicated Disposition Plan:   Patient is from:  Home  Anticipated DC to:  Home  Anticipated DC date:  Anticipate less than 2 midnight stay     Admission status:  Observation   Patricia Aline Zivah Mayr MD [facility]  How to contact the [facility] Attending or Consulting provider [place] or covering provider during after hours [place], for this patient?   Check the care team in [facility] and look for a) attending/consulting [facility] provider listed and b) the [facility] team listed Log into [email or web address] and use [place]'s universal password to access. If you do not have the password, please contact the hospital operator. Locate the [facility] provider you are looking for under [facility] and page to a number that you can be directly reached. If you still have difficulty reaching the provider, please page the [facility] (Director on Call) for the Hospitalists listed on amion for assistance.  10/01/2020, 12:34 AM

## 2020-10-01 NOTE — Progress Notes (Signed)
PROGRESS NOTE   Patricia [name]  XTG:[id number]    DOB: 1950-10-31    DOA: 09/30/2020  PCP: [name], MD   I have briefly reviewed patients previous medical records in [facility].  Chief Complaint  Patient presents with   Loss of Consciousness    Brief Narrative:  70 year old married female, independent, medical history significant for SVT, PAF on Eliquis, HTN, HLD, TIA/CVAs, IBS, anxiety, GERD, chronic low back pain, former smoker, prior syncope, presented to the ED after a syncopal episode and fall at home.  Admitted for syncope, noted paroxysmal SVT versus A. fib with RVR in the 140s.  Cardiology consulted and will see 9/25.   Assessment & Plan:  Principal Problem:   Hyponatremia Active Problems:   Syncope   Essential hypertension   Syncope with collapse: Patient reports a milder episode of syncope in December 2021 in the context of symptomatic SVT December 2021, aborted with adenosine 6 mg by paramedics.  Sees EP cardiology/Dr. Curt Bears.  Evaluation with cardiac monitor showed SVT and 2% burden of PAF.  Compliant with low-dose beta-blockers and Eliquis.  Unclear if this was her precipitant on admission.  On telemetry has very frequent episodes of sustained narrow complex tachycardia up to 140s, SVT versus A. fib with RVR.  CT head: Negative.  Continue telemetry.  TTE 10/01/2020: LVEF 27-03%, grade 1 diastolic dysfunction, no aortic stenosis.  Since she reports some neck PT on the morning of episode, checking carotid ultrasound although suspect will be low yield.  Orthostatics negative.  Check magnesium & aim to keep potassium >4 and magnesium >2.  Increased metoprolol from 12.5 Mg to 25 Mg twice daily.  Can titrate up further.  Will repeat TSH.  Cardiology was consulted and will likely see her 9/25.  Patient has been counseled in the presence of her 2 nurses at bedside that she should not drive for 6 months and she verbalizes understanding.  Paroxysmal SVT/A. fib with RVR:  Management as above.  TTE: LVEF 60-65%.  Increase metoprolol from 12.5-25 Mg twice daily.  Continue Eliquis for A. fib.  Hyponatremia: May be related to some dehydration due to overall poor oral intake yesterday.  Sodium improved from 126-131.  DC IV fluids and encourage oral intake.  Essential hypertension: Mildly uncontrolled.  Continue Cozaar and metoprolol as above and monitor closely.  Hyperlipidemia: Continue statins  IBS: Continue Linzess, MiraLAX and as needed Levsin  GERD: Continue PPI  Chronic pain: Continue Soma, gabapentin and tramadol.  Anxiety: Continue home dose of clonazepam  Asthmatic bronchitis: Stable.  Continue home regimen.  Follows with outpatient pulmonology  Body mass index is 28.52 kg/m.    DVT prophylaxis:   On full dose Eliquis   Code Status: Full Code Family Communication: None at bedside Disposition:  Status is: Observation  The patient will require care spanning > 2 midnights and should be moved to inpatient because: IV treatments appropriate due to intensity of illness or inability to take PO  Dispo: The patient is from: Home              Anticipated d/c is to: Home              Patient currently is not medically stable to d/c.   Difficult to place patient No        Consultants:   None  Procedures:   None  Antimicrobials:    Anti-infectives (From admission, onward)    None  Subjective:  Patient reports that she did not generally feel well all day on day of admission.  At around 11 AM had neck PT at [facility] and on mostly chin tucks, subsequently had make up appointment and is on shopping.  Had some yogurt for breakfast and cheeseburger for lunch.  Throughout the day felt weak and took small steps.  Subsequently passed out and fell in her bathroom with LOC of approximately 1 minute.  No injuries reported.  May have had some lightheadedness and "swimmy" sensation throughout her body prior to passing out.  Feels better  today but has had multiple episode of the same "swimmy" sensation since this morning.  No palpitations, chest pain or dyspnea.  Asking for tramadol.  Objective:   Vitals:   09/30/20 2339 10/01/20 0409 10/01/20 0815 10/01/20 1020  BP: (!) 159/86 (!) 150/82  (!) 148/93  Pulse: 76 73    Resp: 18 18    Temp: 98.3 F (36.8 C) 98 F (36.7 C)    TempSrc: Oral Oral    SpO2: 99% 99% 99%   Weight:      Height:        General exam: Elderly female, moderately built and nourished sitting up comfortably in bed without any distress. Respiratory system: Clear to auscultation. Respiratory effort normal. Cardiovascular system: S1 & S2 heard, RRR. No JVD, murmurs, rubs, gallops or clicks. No pedal edema.  Telemetry personally reviewed: Baseline sinus rhythm but multiple and frequent episodes of sustained narrow complex tachycardia up to 140s, SVT versus A. fib with RVR. Gastrointestinal system: Abdomen is nondistended, soft and nontender. No organomegaly or masses felt. Normal bowel sounds heard. Central nervous system: Alert and oriented. No focal neurological deficits. Extremities: Symmetric 5 x 5 power. Skin: No rashes, lesions or ulcers Psychiatry: Judgement and insight appear normal. Mood & affect appropriate. Neck: No carotid bruit.    Data Reviewed:   I have personally reviewed following labs and imaging studies   CBC: Recent Labs  Lab 09/30/20 1812  WBC 6.6  HGB 14.4  HCT 40.3  MCV 96.0  PLT 209    Basic Metabolic Panel: Recent Labs  Lab 09/30/20 1812 10/01/20 0438  NA 126* 131*  K 4.3 4.0  CL 91* 98  CO2 27 26  GLUCOSE 84 90  BUN 13 10  CREATININE 0.68 0.51  CALCIUM 9.5 9.0    Liver Function Tests: No results for input(s): AST, ALT, ALKPHOS, BILITOT, PROT, ALBUMIN in the last 168 hours.  CBG: No results for input(s): GLUCAP in the last 168 hours.  Microbiology Studies:   Recent Results (from the past 240 hour(s))  Resp Panel by RT-PCR (Flu A&B, Covid)  Nasopharyngeal Swab     Status: None   Collection Time: 09/30/20  7:50 PM   Specimen: Nasopharyngeal Swab; Nasopharyngeal(NP) swabs in vial transport medium  Result Value Ref Range Status   SARS Coronavirus 2 by RT PCR NEGATIVE NEGATIVE Final    Comment: (NOTE) SARS-CoV-2 target nucleic acids are NOT DETECTED.  The SARS-CoV-2 RNA is generally detectable in upper respiratory specimens during the acute phase of infection. The lowest concentration of SARS-CoV-2 viral copies this assay can detect is 138 copies/mL. A negative result does not preclude SARS-Cov-2 infection and should not be used as the sole basis for treatment or other patient management decisions. A negative result may occur with  improper specimen collection/handling, submission of specimen other than nasopharyngeal swab, presence of viral mutation(s) within the areas targeted by this assay, and  inadequate number of viral copies(<138 copies/mL). A negative result must be combined with clinical observations, patient history, and epidemiological information. The expected result is Negative.  Fact Sheet for Patients:  EntrepreneurPulse.com.au  Fact Sheet for Healthcare Providers:  IncredibleEmployment.be  This test is no t yet approved or cleared by the [place] FDA and  has been authorized for detection and/or diagnosis of SARS-CoV-2 by FDA under an Emergency Use Authorization (EUA). This EUA will remain  in effect (meaning this test can be used) for the duration of the COVID-19 declaration under Section 564(b)(1) of the Act, 21 U.S.C.section 360bbb-3(b)(1), unless the authorization is terminated  or revoked sooner.       Influenza A by PCR NEGATIVE NEGATIVE Final   Influenza B by PCR NEGATIVE NEGATIVE Final    Comment: (NOTE) The Xpert Xpress SARS-CoV-2/FLU/RSV plus assay is intended as an aid in the diagnosis of influenza from Nasopharyngeal swab specimens and should not be  used as a sole basis for treatment. Nasal washings and aspirates are unacceptable for Xpert Xpress SARS-CoV-2/FLU/RSV testing.  Fact Sheet for Patients: EntrepreneurPulse.com.au  Fact Sheet for Healthcare Providers: IncredibleEmployment.be  This test is not yet approved or cleared by the [place] FDA and has been authorized for detection and/or diagnosis of SARS-CoV-2 by FDA under an Emergency Use Authorization (EUA). This EUA will remain in effect (meaning this test can be used) for the duration of the COVID-19 declaration under Section 564(b)(1) of the Act, 21 U.S.C. section 360bbb-3(b)(1), unless the authorization is terminated or revoked.  Performed at [facility], [address] [place], [place] [address]      Radiology Studies:  CT Head Wo Contrast  Result Date: 09/30/2020 CLINICAL DATA:  Head trauma after loss of consciousness. EXAM: CT HEAD WITHOUT CONTRAST TECHNIQUE: Contiguous axial images were obtained from the base of the skull through the vertex without intravenous contrast. COMPARISON:  November 07, 2008. FINDINGS: Brain: No evidence of acute infarction, hemorrhage, hydrocephalus, extra-axial collection or mass lesion/mass effect. Vascular: No hyperdense vessel or unexpected calcification. Skull: Normal. Negative for fracture or focal lesion. Sinuses/Orbits: No acute finding. Other: None. IMPRESSION: No acute intracranial abnormality seen. Electronically Signed   By: Marijo Conception M.D.   On: 09/30/2020 20:27   DG Chest Port 1 View  Result Date: 09/30/2020 CLINICAL DATA:  Cough.  Patient reports syncope today. EXAM: PORTABLE CHEST 1 VIEW COMPARISON:  Chest radiograph 06/29/2020 FINDINGS: The cardiomediastinal contours are normal. Atherosclerosis of the aortic arch. Borderline hyperinflation which is similar to prior exam. Pulmonary vasculature is normal. No consolidation, pleural effusion, or pneumothorax. No  acute osseous abnormalities are seen. IMPRESSION: No acute chest findings.  Stable borderline hyperinflation. Electronically Signed   By: Keith Rake M.D.   On: 09/30/2020 20:19   ECHOCARDIOGRAM COMPLETE  Result Date: 10/01/2020    ECHOCARDIOGRAM REPORT   Patient Name:   Patricia [name] Date of Exam: 10/01/2020 Medical Rec #:  [id number]       Height:       67.0 in Accession #:    [id number]      Weight:       182.1 lb Date of Birth:  01-17-1950        BSA:          1.943 m Patient Age:    61 years        BP:           150/82 mmHg Patient Gender: F  HR:           90 bpm. Exam Location:  Inpatient Procedure: 2D Echo, Color Doppler and Cardiac Doppler Indications:    R55 Syncope  History:        Patient has prior history of Echocardiogram examinations, most                 recent 10/25/2017. Risk Factors:Hypertension and Dyslipidemia.  Sonographer:    Raquel Sarna Senior RDCS Referring Phys: [place]  1. Left ventricular ejection fraction, by estimation, is 60 to 65%. The left ventricle has normal function. The left ventricle has no regional wall motion abnormalities. Left ventricular diastolic parameters are consistent with Grade I diastolic dysfunction (impaired relaxation).  2. Right ventricular systolic function is normal. The right ventricular size is normal.  3. Prominent epicardial fat.  4. The mitral valve is normal in structure. No evidence of mitral valve regurgitation. No evidence of mitral stenosis.  5. The aortic valve was not well visualized. There is mild calcification of the aortic valve. Aortic valve regurgitation is not visualized. Mild aortic valve sclerosis is present, with no evidence of aortic valve stenosis.  6. The inferior vena cava is normal in size with <50% respiratory variability, suggesting right atrial pressure of 8 mmHg. FINDINGS  Left Ventricle: Left ventricular ejection fraction, by estimation, is 60 to 65%. The left ventricle has normal function.  The left ventricle has no regional wall motion abnormalities. The left ventricular internal cavity size was normal in size. There is  no left ventricular hypertrophy. Left ventricular diastolic parameters are consistent with Grade I diastolic dysfunction (impaired relaxation). Right Ventricle: The right ventricular size is normal. No increase in right ventricular wall thickness. Right ventricular systolic function is normal. Left Atrium: Left atrial size was normal in size. Right Atrium: Right atrial size was normal in size. Pericardium: Prominent epicardial fat. There is no evidence of pericardial effusion. Mitral Valve: The mitral valve is normal in structure. No evidence of mitral valve regurgitation. No evidence of mitral valve stenosis. Tricuspid Valve: The tricuspid valve is normal in structure. Tricuspid valve regurgitation is not demonstrated. No evidence of tricuspid stenosis. Aortic Valve: The aortic valve was not well visualized. There is mild calcification of the aortic valve. Aortic valve regurgitation is not visualized. Mild aortic valve sclerosis is present, with no evidence of aortic valve stenosis. Pulmonic Valve: The pulmonic valve was normal in structure. Pulmonic valve regurgitation is not visualized. No evidence of pulmonic stenosis. Aorta: The aortic root is normal in size and structure. Venous: The inferior vena cava is normal in size with less than 50% respiratory variability, suggesting right atrial pressure of 8 mmHg. IAS/Shunts: No atrial level shunt detected by color flow Doppler.  LEFT VENTRICLE PLAX 2D LVIDd:         3.90 cm  Diastology LVIDs:         2.80 cm  LV e' medial:    7.40 cm/s LV PW:         1.00 cm  LV E/e' medial:  8.6 LV IVS:        0.80 cm  LV e' lateral:   6.31 cm/s LVOT diam:     2.00 cm  LV E/e' lateral: 10.0 LV SV:         57 LV SV Index:   29 LVOT Area:     3.14 cm  RIGHT VENTRICLE RV S prime:     11.70 cm/s TAPSE (M-mode): 2.6 cm LEFT  ATRIUM             Index        RIGHT ATRIUM           Index LA diam:        2.20 cm 1.13 cm/m  RA Area:     11.30 cm LA Vol (A2C):   45.4 ml 23.36 ml/m RA Volume:   23.30 ml  11.99 ml/m LA Vol (A4C):   23.1 ml 11.89 ml/m LA Biplane Vol: 32.6 ml 16.78 ml/m  AORTIC VALVE LVOT Vmax:   91.40 cm/s LVOT Vmean:  62.800 cm/s LVOT VTI:    0.181 m  AORTA Ao Root diam: 3.60 cm Ao Asc diam:  3.10 cm MITRAL VALVE MV Area (PHT): 4.33 cm    SHUNTS MV Decel Time: 175 msec    Systemic VTI:  0.18 m MV E velocity: 63.40 cm/s  Systemic Diam: 2.00 cm MV A velocity: 83.60 cm/s MV E/A ratio:  0.76 Jenkins Rouge MD Electronically signed by Jenkins Rouge MD Signature Date/Time: 10/01/2020/10:44:38 AM    Final      Scheduled Meds:    apixaban  5 mg Oral BID   cholecalciferol  2,000 Units Oral Daily   clonazePAM  1 mg Oral QHS   gabapentin  600 mg Oral BID WC   linaclotide  290 mcg Oral q1600   losartan  100 mg Oral Daily   metoprolol tartrate  25 mg Oral BID   mometasone-formoterol  2 puff Inhalation BID   pantoprazole  40 mg Oral Daily   [START ON 10/02/2020] polyethylene glycol  34 g Oral q AM   [START ON 10/03/2020] rosuvastatin  5 mg Oral Q M,W,F    Continuous Infusions:    lactated ringers 100 mL/hr at 10/01/20 1134     LOS: 0 days     Vernell Leep, MD, [place], [facility]. [facility]    To contact the attending provider between 7A-7P or the covering provider during after hours 7P-7A, please log into the web site [email or web address] and access using universal [place] password for that web site. If you do not have the password, please call the hospital operator.  10/01/2020, 12:43 PM

## 2020-10-01 NOTE — Progress Notes (Signed)
Echocardiogram 2D Echocardiogram has been performed.  Oneal Deputy Taavi Hoose RDCS 10/01/2020, 9:39 AM

## 2020-10-01 NOTE — Consult Note (Signed)
Cardiology Consultation:   Patient ID: Lawrie Tunks MRN: [id number]; DOB: 1950/03/28  Admit date: 09/30/2020 Date of Consult: 10/01/2020  PCP:  [name], MD   [facility] Providers Cardiologist:  None  Electrophysiologist:  Will Meredith Leeds, MD       Patient Profile:   Zoie Sarin is a 70 y.o. female with a hx of PSVT and afib who is being seen 10/01/2020 for the evaluation of syncope at the request of Dr Algis Liming.  History of Present Illness:   Ms. Ruegg 70 yo female history of PSVT, HTN, HL, TIA, afib, presents with syncoe  From 05/2020 EP note SVT and afib were doing well on lopressor.   Reports episode of syncope yesterday. Occurred at home while standing in the bathroom. Had felt weak most of the day, legs felt heavy earlier on. Was standing in the bathroom and next thing she knew she was on the floor. Her husband heard her fall and came in, patient was arouseable but lethargic. She reports about a week of frequent loose stools, up to 7-8 per day. Reports not uncommon to have frequent loose stools when taking lenzess but this past week was more intense than usual  ER vitals p 66 bp 168/92 Na 126 BUN 13 Cr 0.68 WBC 6.6 Hgb 14.4 Plt 287  Hstrop 3-->4 COVID neg CXR no acute process CT head no acute process Echo LVEF 60-65%, no WMAs, grade I dd, normal RV EKG SVT, suspect atach Past Medical History:  Diagnosis Date   Anxiety    GERD (gastroesophageal reflux disease)    Hyperlipidemia    Hypertension    IBS (irritable bowel syndrome)    Insomnia    Low back pain    TIA (transient ischemic attack) 10/2017   no deficits   Vitamin B12 deficiency    Vitamin D deficiency     Past Surgical History:  Procedure Laterality Date   BACK SURGERY     BREAST EXCISIONAL BIOPSY Left 1997   benign   hemorrhoidecotmy     HERNIA MESH REMOVAL     OPEN REDUCTION INTERNAL FIXATION (ORIF) DISTAL RADIAL FRACTURE Right 11/19/2019   Procedure: OPEN REDUCTION  INTERNAL FIXATION (ORIF) DISTAL RADIUS AND ULNA FRACTURE WITH REPAIR AS NECESSARY;  Surgeon: Roseanne Kaufman, MD;  Location: [place];  Service: Orthopedics;  Laterality: Right;  2 hrs Block with IV sedation   ORIF RADIUS & ULNA FRACTURES     TONSILLECTOMY         Inpatient Medications: Scheduled Meds:  apixaban  5 mg Oral BID   cholecalciferol  2,000 Units Oral Daily   clonazePAM  1 mg Oral QHS   gabapentin  600 mg Oral BID WC   linaclotide  290 mcg Oral q1600   losartan  100 mg Oral Daily   metoprolol tartrate  25 mg Oral BID   mometasone-formoterol  2 puff Inhalation BID   pantoprazole  40 mg Oral Daily   [START ON 10/02/2020] polyethylene glycol  34 g Oral q AM   [START ON 10/03/2020] rosuvastatin  5 mg Oral Q M,W,F   Continuous Infusions:  PRN Meds: acetaminophen **OR** acetaminophen, albuterol, albuterol, fluticasone, hyoscyamine, polyvinyl alcohol, traMADol  Allergies:    Allergies  Allergen Reactions   Augmentin [Amoxicillin-Pot Clavulanate] Nausea Only and Other (See Comments)    GI upset   Levaquin [Levofloxacin In D5w] Other (See Comments)    Joint problems    Mom [Magnesium Hydroxide] Other (See Comments)    Welts  Oxycodone Other (See Comments)    Nausea     Social History:   Social History   Socioeconomic History   Marital status: Married    Spouse name: Not on file   Number of children: Not on file   Years of education: Not on file   Highest education level: Not on file  Occupational History   Not on file  Tobacco Use   Smoking status: Former    Packs/day: 1.00    Years: 50.00    Pack years: 50.00    Types: Cigarettes    Start date: 30    Quit date: 12/14/2019    Years since quitting: 0.8   Smokeless tobacco: Never  Vaping Use   Vaping Use: Never used  Substance and Sexual Activity   Alcohol use: Yes    Comment: occ once every 2 months   Drug use: Never   Sexual activity: Not on file  Other Topics Concern   Not on file  Social  History Narrative   Live with husband.  Education HS.  Retired.  Children 2 (daughter). 1 cup coffee.     Social Determinants of Health   Financial Resource Strain: Not on file  Food Insecurity: Not on file  Transportation Needs: Not on file  Physical Activity: Not on file  Stress: Not on file  Social Connections: Not on file  Intimate Partner Violence: Not on file    Family History:    Family History  Problem Relation Age of Onset   Breast cancer Sister    Breast cancer Maternal Aunt    Breast cancer Paternal Aunt      ROS:  Please see the history of present illness.   All other ROS reviewed and negative.     Physical Exam/Data:   Vitals:   09/30/20 2339 10/01/20 0409 10/01/20 0815 10/01/20 1020  BP: (!) 159/86 (!) 150/82  (!) 148/93  Pulse: 76 73    Resp: 18 18    Temp: 98.3 F (36.8 C) 98 F (36.7 C)    TempSrc: Oral Oral    SpO2: 99% 99% 99%   Weight:      Height:        Intake/Output Summary (Last 24 hours) at 10/01/2020 1351 Last data filed at 10/01/2020 1200 Gross per 24 hour  Intake 2618.91 ml  Output --  Net 2618.91 ml   Last 3 Weights 09/30/2020 08/11/2020 08/11/2020  Weight (lbs) 182 lb 1.6 oz 185 lb 6.4 oz 185 lb 6.4 oz  Weight (kg) 82.6 kg 84.097 kg 84.097 kg     Body mass index is 28.52 kg/m.  General:  Well nourished, well developed, in no acute distress HEENT: normal Neck: no JVD Vascular: No carotid bruits; Distal pulses 2+ bilaterally Cardiac:  normal S1, S2; RRR; no murmur  Lungs:  clear to auscultation bilaterally, no wheezing, rhonchi or rales  Abd: soft, nontender, no hepatomegaly  Ext: no edema Musculoskeletal:  No deformities, BUE and BLE strength normal and equal Skin: warm and dry  Neuro:  CNs 2-12 intact, no focal abnormalities noted Psych:  Normal affect     Laboratory Data:  High Sensitivity Troponin:   Recent Labs  Lab 09/30/20 1812 09/30/20 1950  TROPONINIHS 3 4     Chemistry Recent Labs  Lab 09/30/20 1812  10/01/20 0438  NA 126* 131*  K 4.3 4.0  CL 91* 98  CO2 27 26  GLUCOSE 84 90  BUN 13 10  CREATININE 0.68 0.51  CALCIUM 9.5 9.0  GFRNONAA >60 >60  ANIONGAP 8 7    No results for input(s): PROT, ALBUMIN, AST, ALT, ALKPHOS, BILITOT in the last 168 hours. Lipids No results for input(s): CHOL, TRIG, HDL, LABVLDL, LDLCALC, CHOLHDL in the last 168 hours.  Hematology Recent Labs  Lab 09/30/20 1812  WBC 6.6  RBC 4.20  HGB 14.4  HCT 40.3  MCV 96.0  MCH 34.3*  MCHC 35.7  RDW 13.3  PLT 287   Thyroid No results for input(s): TSH, FREET4 in the last 168 hours.  BNPNo results for input(s): BNP, PROBNP in the last 168 hours.  DDimer No results for input(s): DDIMER in the last 168 hours.   Radiology/Studies:  CT Head Wo Contrast  Result Date: 09/30/2020 CLINICAL DATA:  Head trauma after loss of consciousness. EXAM: CT HEAD WITHOUT CONTRAST TECHNIQUE: Contiguous axial images were obtained from the base of the skull through the vertex without intravenous contrast. COMPARISON:  November 07, 2008. FINDINGS: Brain: No evidence of acute infarction, hemorrhage, hydrocephalus, extra-axial collection or mass lesion/mass effect. Vascular: No hyperdense vessel or unexpected calcification. Skull: Normal. Negative for fracture or focal lesion. Sinuses/Orbits: No acute finding. Other: None. IMPRESSION: No acute intracranial abnormality seen. Electronically Signed   By: Marijo Conception M.D.   On: 09/30/2020 20:27   DG Chest Port 1 View  Result Date: 09/30/2020 CLINICAL DATA:  Cough.  Patient reports syncope today. EXAM: PORTABLE CHEST 1 VIEW COMPARISON:  Chest radiograph 06/29/2020 FINDINGS: The cardiomediastinal contours are normal. Atherosclerosis of the aortic arch. Borderline hyperinflation which is similar to prior exam. Pulmonary vasculature is normal. No consolidation, pleural effusion, or pneumothorax. No acute osseous abnormalities are seen. IMPRESSION: No acute chest findings.  Stable borderline  hyperinflation. Electronically Signed   By: Keith Rake M.D.   On: 09/30/2020 20:19   ECHOCARDIOGRAM COMPLETE  Result Date: 10/01/2020    ECHOCARDIOGRAM REPORT   Patient Name:   SANDRIA MCENROE Date of Exam: 10/01/2020 Medical Rec #:  [id number]       Height:       67.0 in Accession #:    [id number]      Weight:       182.1 lb Date of Birth:  October 30, 1950        BSA:          1.943 m Patient Age:    71 years        BP:           150/82 mmHg Patient Gender: F               HR:           90 bpm. Exam Location:  Inpatient Procedure: 2D Echo, Color Doppler and Cardiac Doppler Indications:    R55 Syncope  History:        Patient has prior history of Echocardiogram examinations, most                 recent 10/25/2017. Risk Factors:Hypertension and Dyslipidemia.  Sonographer:    Raquel Sarna Senior RDCS Referring Phys: [place]  1. Left ventricular ejection fraction, by estimation, is 60 to 65%. The left ventricle has normal function. The left ventricle has no regional wall motion abnormalities. Left ventricular diastolic parameters are consistent with Grade I diastolic dysfunction (impaired relaxation).  2. Right ventricular systolic function is normal. The right ventricular size is normal.  3. Prominent epicardial fat.  4. The mitral valve is normal in structure.  No evidence of mitral valve regurgitation. No evidence of mitral stenosis.  5. The aortic valve was not well visualized. There is mild calcification of the aortic valve. Aortic valve regurgitation is not visualized. Mild aortic valve sclerosis is present, with no evidence of aortic valve stenosis.  6. The inferior vena cava is normal in size with <50% respiratory variability, suggesting right atrial pressure of 8 mmHg. FINDINGS  Left Ventricle: Left ventricular ejection fraction, by estimation, is 60 to 65%. The left ventricle has normal function. The left ventricle has no regional wall motion abnormalities. The left ventricular internal  cavity size was normal in size. There is  no left ventricular hypertrophy. Left ventricular diastolic parameters are consistent with Grade I diastolic dysfunction (impaired relaxation). Right Ventricle: The right ventricular size is normal. No increase in right ventricular wall thickness. Right ventricular systolic function is normal. Left Atrium: Left atrial size was normal in size. Right Atrium: Right atrial size was normal in size. Pericardium: Prominent epicardial fat. There is no evidence of pericardial effusion. Mitral Valve: The mitral valve is normal in structure. No evidence of mitral valve regurgitation. No evidence of mitral valve stenosis. Tricuspid Valve: The tricuspid valve is normal in structure. Tricuspid valve regurgitation is not demonstrated. No evidence of tricuspid stenosis. Aortic Valve: The aortic valve was not well visualized. There is mild calcification of the aortic valve. Aortic valve regurgitation is not visualized. Mild aortic valve sclerosis is present, with no evidence of aortic valve stenosis. Pulmonic Valve: The pulmonic valve was normal in structure. Pulmonic valve regurgitation is not visualized. No evidence of pulmonic stenosis. Aorta: The aortic root is normal in size and structure. Venous: The inferior vena cava is normal in size with less than 50% respiratory variability, suggesting right atrial pressure of 8 mmHg. IAS/Shunts: No atrial level shunt detected by color flow Doppler.  LEFT VENTRICLE PLAX 2D LVIDd:         3.90 cm  Diastology LVIDs:         2.80 cm  LV e' medial:    7.40 cm/s LV PW:         1.00 cm  LV E/e' medial:  8.6 LV IVS:        0.80 cm  LV e' lateral:   6.31 cm/s LVOT diam:     2.00 cm  LV E/e' lateral: 10.0 LV SV:         57 LV SV Index:   29 LVOT Area:     3.14 cm  RIGHT VENTRICLE RV S prime:     11.70 cm/s TAPSE (M-mode): 2.6 cm LEFT ATRIUM             Index       RIGHT ATRIUM           Index LA diam:        2.20 cm 1.13 cm/m  RA Area:     11.30 cm LA  Vol (A2C):   45.4 ml 23.36 ml/m RA Volume:   23.30 ml  11.99 ml/m LA Vol (A4C):   23.1 ml 11.89 ml/m LA Biplane Vol: 32.6 ml 16.78 ml/m  AORTIC VALVE LVOT Vmax:   91.40 cm/s LVOT Vmean:  62.800 cm/s LVOT VTI:    0.181 m  AORTA Ao Root diam: 3.60 cm Ao Asc diam:  3.10 cm MITRAL VALVE MV Area (PHT): 4.33 cm    SHUNTS MV Decel Time: 175 msec    Systemic VTI:  0.18 m MV E velocity: 63.40 cm/s  Systemic Diam: 2.00 cm MV A velocity: 83.60 cm/s MV E/A ratio:  0.76 Jenkins Rouge MD Electronically signed by Jenkins Rouge MD Signature Date/Time: 10/01/2020/10:44:38 AM    Final      Assessment and Plan:   1.Syncope -orthostatics done at 130 AM off patterns. SBP and DBP increased lying to sitting, both decreased significantly sitting to standing sbp 166->140 and dbp 96 to 81. HR sitting 81, after standing 3 minutes up to 150. SHe was significnatly hyponatremic on admission, Na improved with IVFs. She reports frequent loose watery stools over the last week - EKG shows SVT, tele review shows SR and SVT to 140s at times. No heart rhythms detected so far would account for sycnope.   -I suspect her syncope is due to hypovolemia - continue to follow tele today and tomorrow - encouraged increased regular hydration, particularly when having loose stools.  - if recurrent syncope over time with maintaing hydration would repeat monitor  2.PSVT - episodes of SVT here, looks most like atach - lopressor increased to 25mg  bid, plenty of room to titrate further as needed. Increase to 25mg  tid today.    3. Afib - continue beta blocker, eliquis.    4. Hyponatremia - secondary to GI lossess, poor oral intake - improved with IVFs   For questions or updates, please contact [place] Please consult [email or web address] for contact info under    Signed, Carlyle Dolly, MD  10/01/2020 1:51 PM

## 2020-10-02 DIAGNOSIS — R55 Syncope and collapse: Secondary | ICD-10-CM | POA: Diagnosis not present

## 2020-10-02 DIAGNOSIS — I10 Essential (primary) hypertension: Secondary | ICD-10-CM | POA: Diagnosis not present

## 2020-10-02 DIAGNOSIS — E871 Hypo-osmolality and hyponatremia: Secondary | ICD-10-CM | POA: Diagnosis not present

## 2020-10-02 DIAGNOSIS — I471 Supraventricular tachycardia: Secondary | ICD-10-CM | POA: Diagnosis not present

## 2020-10-02 LAB — BASIC METABOLIC PANEL
Anion gap: 9 (ref 5–15)
Anion gap: 4 — ABNORMAL LOW (ref 5–15)
BUN: 10 mg/dL (ref 8–23)
BUN: 11 mg/dL (ref 8–23)
CO2: 24 mmol/L (ref 22–32)
CO2: 28 mmol/L (ref 22–32)
Calcium: 8.7 mg/dL — ABNORMAL LOW (ref 8.9–10.3)
Calcium: 9.1 mg/dL (ref 8.9–10.3)
Chloride: 95 mmol/L — ABNORMAL LOW (ref 98–111)
Chloride: 95 mmol/L — ABNORMAL LOW (ref 98–111)
Creatinine, Ser: 0.54 mg/dL (ref 0.44–1.00)
Creatinine, Ser: 0.79 mg/dL (ref 0.44–1.00)
GFR, Estimated: >60 mL/min (ref >60)
GFR, Estimated: >60 mL/min (ref >60)
Glucose, Bld: 113 mg/dL — ABNORMAL HIGH (ref 70–99)
Glucose, Bld: 108 mg/dL — ABNORMAL HIGH (ref 70–99)
Potassium: 3.6 mmol/L (ref 3.5–5.1)
Potassium: 4.5 mmol/L (ref 3.5–5.1)
Sodium: 128 mmol/L — ABNORMAL LOW (ref 135–145)
Sodium: 127 mmol/L — ABNORMAL LOW (ref 135–145)

## 2020-10-02 MED ORDER — SODIUM CHLORIDE 0.9 % IV BOLUS
500.0000 mL | Freq: Once | INTRAVENOUS | Status: AC
  Administered 2020-10-02: 500 mL via INTRAVENOUS

## 2020-10-02 MED ORDER — METOPROLOL TARTRATE 50 MG PO TABS: 50.0000 mg | ORAL_TABLET | Freq: Two times a day (BID) | ORAL | 0 refills | Status: DC

## 2020-10-02 MED ORDER — METOPROLOL TARTRATE 50 MG PO TABS
50.0000 mg | ORAL_TABLET | Freq: Two times a day (BID) | ORAL | Status: DC
  Administered 2020-10-02 – 2020-10-05 (×7): 50 mg via ORAL
  Filled 2020-10-02 (×7): qty 1

## 2020-10-02 MED ORDER — POTASSIUM CHLORIDE CRYS ER 20 MEQ PO TBCR
40.0000 meq | EXTENDED_RELEASE_TABLET | Freq: Once | ORAL | Status: AC
  Administered 2020-10-02: 40 meq via ORAL
  Filled 2020-10-02: qty 2

## 2020-10-02 MED ORDER — LINACLOTIDE 145 MCG PO CAPS: 145.0000 ug | ORAL_CAPSULE | Freq: Every day | ORAL | 0 refills | Status: DC

## 2020-10-02 MED ORDER — CARISOPRODOL 350 MG PO TABS
350.0000 mg | ORAL_TABLET | Freq: Three times a day (TID) | ORAL | Status: DC
  Administered 2020-10-02 – 2020-10-05 (×8): 350 mg via ORAL
  Filled 2020-10-02 (×8): qty 1

## 2020-10-02 MED ORDER — SODIUM CHLORIDE 0.9 % IV SOLN: INTRAVENOUS | Status: DC

## 2020-10-02 MED ORDER — LINACLOTIDE 145 MCG PO CAPS
145.0000 ug | ORAL_CAPSULE | Freq: Every day | ORAL | Status: DC
  Administered 2020-10-02 – 2020-10-04 (×2): 145 ug via ORAL
  Filled 2020-10-02 (×4): qty 1

## 2020-10-02 NOTE — Progress Notes (Addendum)
PROGRESS NOTE    Fortune Torosian   PXT:[id number]  DOB: 09-23-1950  DOA: 09/30/2020 PCP: [name], MD   Brief Narrative:  Madden Piazza is a 70 year old married female, independent, medical history significant for SVT, PAF on Eliquis, HTN, HLD, TIA/CVAs, IBS, anxiety, GERD, chronic low back pain, former smoker, prior syncope, presented to the ED after a syncopal episode and fall at home.  Admitted for syncope and noted to have paroxysmal SVT versus A. fib with RVR in the 140s.     Subjective: Diarrhea is improving now.     Assessment & Plan:   Principal Problem:   Hyponatremia in setting of diarrhea -Sodium has dropped again today from 131-128, chloride has also dropped from 98-95 - Have encouraged patient to increase oral intake - Have given a 500 cc normal saline bolus this morning - Addendum: repeat sodium now 127- will start NS at 50 cc /hr and repeat sodium in AM  Active Problems:   Syncope-possibly related to SVT - Patient reports a milder episode of syncope in December 2021 in the context of symptomatic SVT December 2021, aborted with adenosine 6 mg by paramedics.  Sees EP cardiology/Dr. Curt Bears.  Evaluation with cardiac monitor showed SVT and 2% burden of PAF.  Compliant with low-dose beta-blockers and Eliquis.  Unclear if this was her precipitant on admission.  On telemetry has very frequent episodes of sustained narrow complex tachycardia up to 140s, SVT versus A. fib with RVR.  CT head: Negative.  Continue telemetry.  TTE 10/01/2020: LVEF 62-70%, grade 1 diastolic dysfunction, no aortic stenosis.  Since she reports some neck PT on the morning of episode, checking carotid ultrasound although suspect will be low yield.  Orthostatics negative.  Check magnesium & aim to keep potassium >4 and magnesium >2.   - Lopressor increased from 25 to 50 mg BID today by cardiology as she continues to have runs of SVT- last run occurred this AM while ambulating to the bathroom -  TSH normal at 1.037  Diarrhea-IBS - The patient stated that she had 1 episode yesterday - She feels like she needs a smaller dose of Linzess as she has been having multiple loose bowel movements daily and plans to ask her GI doctor to reduce the dose by half - I have gone ahead and reduced her dose to 245 mcg daily - Hold MiraLAX for today    Essential hypertension -Continue metoprolol at 50 mg twice daily and losartan  Anxiety - Continue clonazepam at bedtime  Chronic lower back pain - Continue Neurontin 600 mg twice daily - resume Soma  Hyperlipidemia - Continue rosuvastatin  Time spent in minutes: 35 DVT prophylaxis:  apixaban (ELIQUIS) tablet 5 mg  Code Status: Full code Family Communication:  Level of Care: Level of care: Telemetry Disposition Plan:  Status is: Inpatient  Remains inpatient appropriate because:Persistent severe electrolyte disturbances continued SVT  Dispo: The patient is from: Home              Anticipated d/c is to: Home              Patient currently is not medically stable to d/c.   Difficult to place patient No      Consultants:  Cardiology Procedures:  None Antimicrobials:  Anti-infectives (From admission, onward)    None        Objective: Vitals:   10/02/20 0359 10/02/20 0750 10/02/20 0837 10/02/20 1408  BP: (!) 148/84  133/74 (!) 152/82  Pulse: 68  78 80  Resp: 18   16  Temp: 97.9 F (36.6 C)   98.2 F (36.8 C)  TempSrc: Oral   Oral  SpO2: 95% 97%  97%  Weight:      Height:        Intake/Output Summary (Last 24 hours) at 10/02/2020 1439 Last data filed at 10/02/2020 2440 Gross per 24 hour  Intake 372.55 ml  Output --  Net 372.55 ml   Filed Weights   09/30/20 2330  Weight: 82.6 kg    Examination: General exam: Appears comfortable  HEENT: PERRLA, oral mucosa moist, no sclera icterus or thrush Respiratory system: Clear to auscultation. Respiratory effort normal. Cardiovascular system: S1 & S2 heard, IIRR.    Gastrointestinal system: Abdomen soft, non-tender, nondistended. Normal bowel sounds. Central nervous system: Alert and oriented. No focal neurological deficits. Extremities: No cyanosis, clubbing or edema Skin: No rashes or ulcers Psychiatry:  Mood & affect appropriate.     Data Reviewed: I have personally reviewed following labs and imaging studies  CBC: Recent Labs  Lab 09/30/20 1812  WBC 6.6  HGB 14.4  HCT 40.3  MCV 96.0  PLT 102   Basic Metabolic Panel: Recent Labs  Lab 09/30/20 1812 10/01/20 0438 10/01/20 1319 10/02/20 0426  NA 126* 131*  --  128*  K 4.3 4.0  --  3.6  CL 91* 98  --  95*  CO2 27 26  --  24  GLUCOSE 84 90  --  113*  BUN 13 10  --  10  CREATININE 0.68 0.51  --  0.54  CALCIUM 9.5 9.0  --  8.7*  MG  --   --  1.6*  --    GFR: Estimated Creatinine Clearance: 71.3 mL/min (by C-G formula based on SCr of 0.54 mg/dL). Liver Function Tests: No results for input(s): AST, ALT, ALKPHOS, BILITOT, PROT, ALBUMIN in the last 168 hours. No results for input(s): LIPASE, AMYLASE in the last 168 hours. No results for input(s): AMMONIA in the last 168 hours. Coagulation Profile: No results for input(s): INR, PROTIME in the last 168 hours. Cardiac Enzymes: No results for input(s): CKTOTAL, CKMB, CKMBINDEX, TROPONINI in the last 168 hours. BNP (last 3 results) No results for input(s): PROBNP in the last 8760 hours. HbA1C: No results for input(s): HGBA1C in the last 72 hours. CBG: No results for input(s): GLUCAP in the last 168 hours. Lipid Profile: No results for input(s): CHOL, HDL, LDLCALC, TRIG, CHOLHDL, LDLDIRECT in the last 72 hours. Thyroid Function Tests: Recent Labs    10/01/20 1319  TSH 1.037   Anemia Panel: No results for input(s): VITAMINB12, FOLATE, FERRITIN, TIBC, IRON, RETICCTPCT in the last 72 hours. Urine analysis: No results found for: COLORURINE, APPEARANCEUR, LABSPEC, PHURINE, GLUCOSEU, HGBUR, BILIRUBINUR, KETONESUR, PROTEINUR,  UROBILINOGEN, NITRITE, LEUKOCYTESUR Sepsis Labs: @LABRCNTIP (procalcitonin:4,lacticidven:4) ) Recent Results (from the past 240 hour(s))  Resp Panel by RT-PCR (Flu A&B, Covid) Nasopharyngeal Swab     Status: None   Collection Time: 09/30/20  7:50 PM   Specimen: Nasopharyngeal Swab; Nasopharyngeal(NP) swabs in vial transport medium  Result Value Ref Range Status   SARS Coronavirus 2 by RT PCR NEGATIVE NEGATIVE Final    Comment: (NOTE) SARS-CoV-2 target nucleic acids are NOT DETECTED.  The SARS-CoV-2 RNA is generally detectable in upper respiratory specimens during the acute phase of infection. The lowest concentration of SARS-CoV-2 viral copies this assay can detect is 138 copies/mL. A negative result does not preclude SARS-Cov-2 infection and should not be used as  the sole basis for treatment or other patient management decisions. A negative result may occur with  improper specimen collection/handling, submission of specimen other than nasopharyngeal swab, presence of viral mutation(s) within the areas targeted by this assay, and inadequate number of viral copies(<138 copies/mL). A negative result must be combined with clinical observations, patient history, and epidemiological information. The expected result is Negative.  Fact Sheet for Patients:  EntrepreneurPulse.com.au  Fact Sheet for Healthcare Providers:  IncredibleEmployment.be  This test is no t yet approved or cleared by the [place] FDA and  has been authorized for detection and/or diagnosis of SARS-CoV-2 by FDA under an Emergency Use Authorization (EUA). This EUA will remain  in effect (meaning this test can be used) for the duration of the COVID-19 declaration under Section 564(b)(1) of the Act, 21 U.S.C.section 360bbb-3(b)(1), unless the authorization is terminated  or revoked sooner.       Influenza A by PCR NEGATIVE NEGATIVE Final   Influenza B by PCR NEGATIVE NEGATIVE  Final    Comment: (NOTE) The Xpert Xpress SARS-CoV-2/FLU/RSV plus assay is intended as an aid in the diagnosis of influenza from Nasopharyngeal swab specimens and should not be used as a sole basis for treatment. Nasal washings and aspirates are unacceptable for Xpert Xpress SARS-CoV-2/FLU/RSV testing.  Fact Sheet for Patients: EntrepreneurPulse.com.au  Fact Sheet for Healthcare Providers: IncredibleEmployment.be  This test is not yet approved or cleared by the [place] FDA and has been authorized for detection and/or diagnosis of SARS-CoV-2 by FDA under an Emergency Use Authorization (EUA). This EUA will remain in effect (meaning this test can be used) for the duration of the COVID-19 declaration under Section 564(b)(1) of the Act, 21 U.S.C. section 360bbb-3(b)(1), unless the authorization is terminated or revoked.  Performed at [facility], [address], [place], [place] [address]          Radiology Studies: CT Head Wo Contrast  Result Date: 09/30/2020 CLINICAL DATA:  Head trauma after loss of consciousness. EXAM: CT HEAD WITHOUT CONTRAST TECHNIQUE: Contiguous axial images were obtained from the base of the skull through the vertex without intravenous contrast. COMPARISON:  November 07, 2008. FINDINGS: Brain: No evidence of acute infarction, hemorrhage, hydrocephalus, extra-axial collection or mass lesion/mass effect. Vascular: No hyperdense vessel or unexpected calcification. Skull: Normal. Negative for fracture or focal lesion. Sinuses/Orbits: No acute finding. Other: None. IMPRESSION: No acute intracranial abnormality seen. Electronically Signed   By: Marijo Conception M.D.   On: 09/30/2020 20:27   DG Chest Port 1 View  Result Date: 09/30/2020 CLINICAL DATA:  Cough.  Patient reports syncope today. EXAM: PORTABLE CHEST 1 VIEW COMPARISON:  Chest radiograph 06/29/2020 FINDINGS: The cardiomediastinal contours are  normal. Atherosclerosis of the aortic arch. Borderline hyperinflation which is similar to prior exam. Pulmonary vasculature is normal. No consolidation, pleural effusion, or pneumothorax. No acute osseous abnormalities are seen. IMPRESSION: No acute chest findings.  Stable borderline hyperinflation. Electronically Signed   By: Keith Rake M.D.   On: 09/30/2020 20:19   ECHOCARDIOGRAM COMPLETE  Result Date: 10/01/2020    ECHOCARDIOGRAM REPORT   Patient Name:   BREEONNA MONE Date of Exam: 10/01/2020 Medical Rec #:  [id number]       Height:       67.0 in Accession #:    [id number]      Weight:       182.1 lb Date of Birth:  February 11, 1950        BSA:  1.943 m Patient Age:    48 years        BP:           150/82 mmHg Patient Gender: F               HR:           90 bpm. Exam Location:  Inpatient Procedure: 2D Echo, Color Doppler and Cardiac Doppler Indications:    R55 Syncope  History:        Patient has prior history of Echocardiogram examinations, most                 recent 10/25/2017. Risk Factors:Hypertension and Dyslipidemia.  Sonographer:    Raquel Sarna Senior RDCS Referring Phys: [place]  1. Left ventricular ejection fraction, by estimation, is 60 to 65%. The left ventricle has normal function. The left ventricle has no regional wall motion abnormalities. Left ventricular diastolic parameters are consistent with Grade I diastolic dysfunction (impaired relaxation).  2. Right ventricular systolic function is normal. The right ventricular size is normal.  3. Prominent epicardial fat.  4. The mitral valve is normal in structure. No evidence of mitral valve regurgitation. No evidence of mitral stenosis.  5. The aortic valve was not well visualized. There is mild calcification of the aortic valve. Aortic valve regurgitation is not visualized. Mild aortic valve sclerosis is present, with no evidence of aortic valve stenosis.  6. The inferior vena cava is normal in size with <50%  respiratory variability, suggesting right atrial pressure of 8 mmHg. FINDINGS  Left Ventricle: Left ventricular ejection fraction, by estimation, is 60 to 65%. The left ventricle has normal function. The left ventricle has no regional wall motion abnormalities. The left ventricular internal cavity size was normal in size. There is  no left ventricular hypertrophy. Left ventricular diastolic parameters are consistent with Grade I diastolic dysfunction (impaired relaxation). Right Ventricle: The right ventricular size is normal. No increase in right ventricular wall thickness. Right ventricular systolic function is normal. Left Atrium: Left atrial size was normal in size. Right Atrium: Right atrial size was normal in size. Pericardium: Prominent epicardial fat. There is no evidence of pericardial effusion. Mitral Valve: The mitral valve is normal in structure. No evidence of mitral valve regurgitation. No evidence of mitral valve stenosis. Tricuspid Valve: The tricuspid valve is normal in structure. Tricuspid valve regurgitation is not demonstrated. No evidence of tricuspid stenosis. Aortic Valve: The aortic valve was not well visualized. There is mild calcification of the aortic valve. Aortic valve regurgitation is not visualized. Mild aortic valve sclerosis is present, with no evidence of aortic valve stenosis. Pulmonic Valve: The pulmonic valve was normal in structure. Pulmonic valve regurgitation is not visualized. No evidence of pulmonic stenosis. Aorta: The aortic root is normal in size and structure. Venous: The inferior vena cava is normal in size with less than 50% respiratory variability, suggesting right atrial pressure of 8 mmHg. IAS/Shunts: No atrial level shunt detected by color flow Doppler.  LEFT VENTRICLE PLAX 2D LVIDd:         3.90 cm  Diastology LVIDs:         2.80 cm  LV e' medial:    7.40 cm/s LV PW:         1.00 cm  LV E/e' medial:  8.6 LV IVS:        0.80 cm  LV e' lateral:   6.31 cm/s LVOT  diam:     2.00 cm  LV E/e' lateral: 10.0 LV SV:         57 LV SV Index:   29 LVOT Area:     3.14 cm  RIGHT VENTRICLE RV S prime:     11.70 cm/s TAPSE (M-mode): 2.6 cm LEFT ATRIUM             Index       RIGHT ATRIUM           Index LA diam:        2.20 cm 1.13 cm/m  RA Area:     11.30 cm LA Vol (A2C):   45.4 ml 23.36 ml/m RA Volume:   23.30 ml  11.99 ml/m LA Vol (A4C):   23.1 ml 11.89 ml/m LA Biplane Vol: 32.6 ml 16.78 ml/m  AORTIC VALVE LVOT Vmax:   91.40 cm/s LVOT Vmean:  62.800 cm/s LVOT VTI:    0.181 m  AORTA Ao Root diam: 3.60 cm Ao Asc diam:  3.10 cm MITRAL VALVE MV Area (PHT): 4.33 cm    SHUNTS MV Decel Time: 175 msec    Systemic VTI:  0.18 m MV E velocity: 63.40 cm/s  Systemic Diam: 2.00 cm MV A velocity: 83.60 cm/s MV E/A ratio:  0.76 Jenkins Rouge MD Electronically signed by Jenkins Rouge MD Signature Date/Time: 10/01/2020/10:44:38 AM    Final    VAS US CAROTID  Result Date: 10/01/2020 Carotid Arterial Duplex Study Patient Name:  SHIRLENE ANDAYA  Date of Exam:   10/01/2020 Medical Rec #: [id number]           Accession #:    [id number] Date of Birth: [birth year]            Patient Gender: F Patient Age:   59 years Exam Location:  [facility] Procedure:      VAS US CAROTID Referring Phys: ANAND HONGALGI --------------------------------------------------------------------------------  Indications:      Syncope. Risk Factors:     Hypertension, hyperlipidemia, past history of smoking, prior                   CVA. Comparison Study: No prior studies. Performing Technologist: Darlin Coco RDMS, RVT  Examination Guidelines: A complete evaluation includes B-mode imaging, spectral Doppler, color Doppler, and power Doppler as needed of all accessible portions of each vessel. Bilateral testing is considered an integral part of a complete examination. Limited examinations for reoccurring indications may be performed as noted.  Right Carotid Findings:  +----------+--------+--------+--------+-------------------+--------+           PSV cm/sEDV cm/sStenosisPlaque Description Comments +----------+--------+--------+--------+-------------------+--------+ CCA Prox  56      17                                          +----------+--------+--------+--------+-------------------+--------+ CCA Distal52      19                                          +----------+--------+--------+--------+-------------------+--------+ ICA Prox  52      26      1-39%   calcific and smooth         +----------+--------+--------+--------+-------------------+--------+ ICA Distal87      30                                          +----------+--------+--------+--------+-------------------+--------+  ECA       45      9                                           +----------+--------+--------+--------+-------------------+--------+ +----------+--------+-------+----------------+-------------------+           PSV cm/sEDV cmsDescribe        Arm Pressure (mmHG) +----------+--------+-------+----------------+-------------------+ [id number]             Multiphasic, WNL                    +----------+--------+-------+----------------+-------------------+ +---------+--------+--+--------+--+---------+ VertebralPSV cm/s57EDV cm/s18Antegrade +---------+--------+--+--------+--+---------+  Left Carotid Findings: +----------+--------+--------+--------+----------------------+--------+           PSV cm/sEDV cm/sStenosisPlaque Description    Comments +----------+--------+--------+--------+----------------------+--------+ CCA Prox  76      18                                             +----------+--------+--------+--------+----------------------+--------+ CCA Distal66      18                                             +----------+--------+--------+--------+----------------------+--------+ ICA Prox  37      13      1-39%   focal and  heterogenous         +----------+--------+--------+--------+----------------------+--------+ ICA Distal61      23                                             +----------+--------+--------+--------+----------------------+--------+ ECA       58                                                     +----------+--------+--------+--------+----------------------+--------+ +----------+--------+--------+----------------+-------------------+           PSV cm/sEDV cm/sDescribe        Arm Pressure (mmHG) +----------+--------+--------+----------------+-------------------+ [id number]              Multiphasic, WNL                    +----------+--------+--------+----------------+-------------------+ +---------+--------+--+--------+-+---------+ VertebralPSV cm/s39EDV cm/s9Antegrade +---------+--------+--+--------+-+---------+   Summary: Right Carotid: Velocities in the right ICA are consistent with a 1-39% stenosis. Left Carotid: Velocities in the left ICA are consistent with a 1-39% stenosis. Vertebrals:  Bilateral vertebral arteries demonstrate antegrade flow. Subclavians: Normal flow hemodynamics were seen in bilateral subclavian              arteries. *See table(s) above for measurements and observations.  Electronically signed by Servando Snare MD on 10/01/2020 at 5:26:53 PM.    Final       Scheduled Meds:  apixaban  5 mg Oral BID   cholecalciferol  2,000 Units Oral Daily   clonazePAM  1 mg Oral QHS   gabapentin  600 mg Oral BID WC   linaclotide  290 mcg Oral q1600   losartan  100 mg Oral Daily   metoprolol tartrate  50 mg Oral BID   mometasone-formoterol  2 puff Inhalation BID   pantoprazole  40 mg Oral Daily   polyethylene glycol  34 g Oral q AM   [START ON 10/03/2020] rosuvastatin  5 mg Oral Q M,W,F   Continuous Infusions:   LOS: 1 day      Debbe Odea, MD [facility] Pager: [email or web address] 10/02/2020, 2:39 PM

## 2020-10-02 NOTE — Progress Notes (Signed)
Progress Note  Patient Name: Patricia [name] Date of Encounter: 10/02/2020  [facility] Cardiologist: Camnitz EP  Subjective   No complaints  Inpatient Medications    Scheduled Meds:  apixaban  5 mg Oral BID   cholecalciferol  2,000 Units Oral Daily   clonazePAM  1 mg Oral QHS   gabapentin  600 mg Oral BID WC   linaclotide  290 mcg Oral q1600   losartan  100 mg Oral Daily   metoprolol tartrate  25 mg Oral TID   mometasone-formoterol  2 puff Inhalation BID   pantoprazole  40 mg Oral Daily   polyethylene glycol  34 g Oral q AM   [START ON 10/03/2020] rosuvastatin  5 mg Oral Q M,W,F   Continuous Infusions:  PRN Meds: acetaminophen **OR** acetaminophen, albuterol, albuterol, fluticasone, hyoscyamine, polyvinyl alcohol, traMADol   Vital Signs    Vitals:   10/01/20 1020 10/01/20 2005 10/01/20 2006 10/02/20 0359  BP: (!) 148/93 139/78  (!) 148/84  Pulse:  69  68  Resp:  18  18  Temp:  98.4 F (36.9 C)  97.9 F (36.6 C)  TempSrc:  Oral  Oral  SpO2:  99% 100% 95%  Weight:      Height:        Intake/Output Summary (Last 24 hours) at 10/02/2020 0728 Last data filed at 10/01/2020 2246 Gross per 24 hour  Intake 1089.79 ml  Output --  Net 1089.79 ml   Last 3 Weights 09/30/2020 08/11/2020 08/11/2020  Weight (lbs) 182 lb 1.6 oz 185 lb 6.4 oz 185 lb 6.4 oz  Weight (kg) 82.6 kg 84.097 kg 84.097 kg      Telemetry    SR, occasional SVT - Personally Reviewed  ECG    N/a - Personally Reviewed  Physical Exam   GEN: No acute distress.   Neck: No JVD Cardiac: RRR, no murmurs, rubs, or gallops.  Respiratory: Clear to auscultation bilaterally. GI: Soft, nontender, non-distended  MS: No edema; No deformity. Neuro:  Nonfocal  Psych: Normal affect   Labs    High Sensitivity Troponin:   Recent Labs  Lab 09/30/20 1812 09/30/20 1950  TROPONINIHS 3 4     Chemistry Recent Labs  Lab 09/30/20 1812 10/01/20 0438 10/01/20 1319 10/02/20 0426  NA 126* 131*  --   128*  K 4.3 4.0  --  3.6  CL 91* 98  --  95*  CO2 27 26  --  24  GLUCOSE 84 90  --  113*  BUN 13 10  --  10  CREATININE 0.68 0.51  --  0.54  CALCIUM 9.5 9.0  --  8.7*  MG  --   --  1.6*  --   GFRNONAA >60 >60  --  >60  ANIONGAP 8 7  --  9    Lipids No results for input(s): CHOL, TRIG, HDL, LABVLDL, LDLCALC, CHOLHDL in the last 168 hours.  Hematology Recent Labs  Lab 09/30/20 1812  WBC 6.6  RBC 4.20  HGB 14.4  HCT 40.3  MCV 96.0  MCH 34.3*  MCHC 35.7  RDW 13.3  PLT 287   Thyroid  Recent Labs  Lab 10/01/20 1319  TSH 1.037    BNPNo results for input(s): BNP, PROBNP in the last 168 hours.  DDimer No results for input(s): DDIMER in the last 168 hours.   Radiology    CT Head Wo Contrast  Result Date: 09/30/2020 CLINICAL DATA:  Head trauma after loss of consciousness.  EXAM: CT HEAD WITHOUT CONTRAST TECHNIQUE: Contiguous axial images were obtained from the base of the skull through the vertex without intravenous contrast. COMPARISON:  November 07, 2008. FINDINGS: Brain: No evidence of acute infarction, hemorrhage, hydrocephalus, extra-axial collection or mass lesion/mass effect. Vascular: No hyperdense vessel or unexpected calcification. Skull: Normal. Negative for fracture or focal lesion. Sinuses/Orbits: No acute finding. Other: None. IMPRESSION: No acute intracranial abnormality seen. Electronically Signed   By: Marijo Conception M.D.   On: 09/30/2020 20:27   DG Chest Port 1 View  Result Date: 09/30/2020 CLINICAL DATA:  Cough.  Patient reports syncope today. EXAM: PORTABLE CHEST 1 VIEW COMPARISON:  Chest radiograph 06/29/2020 FINDINGS: The cardiomediastinal contours are normal. Atherosclerosis of the aortic arch. Borderline hyperinflation which is similar to prior exam. Pulmonary vasculature is normal. No consolidation, pleural effusion, or pneumothorax. No acute osseous abnormalities are seen. IMPRESSION: No acute chest findings.  Stable borderline hyperinflation. Electronically  Signed   By: Keith Rake M.D.   On: 09/30/2020 20:19   ECHOCARDIOGRAM COMPLETE  Result Date: 10/01/2020    ECHOCARDIOGRAM REPORT   Patient Name:   Patricia [name] Date of Exam: 10/01/2020 Medical Rec #:  [id number]       Height:       67.0 in Accession #:    [id number]      Weight:       182.1 lb Date of Birth:  01-05-1951        BSA:          1.943 m Patient Age:    70 years        BP:           150/82 mmHg Patient Gender: F               HR:           90 bpm. Exam Location:  Inpatient Procedure: 2D Echo, Color Doppler and Cardiac Doppler Indications:    R55 Syncope  History:        Patient has prior history of Echocardiogram examinations, most                 recent 10/25/2017. Risk Factors:Hypertension and Dyslipidemia.  Sonographer:    Raquel Sarna Senior RDCS Referring Phys: [place]  1. Left ventricular ejection fraction, by estimation, is 60 to 65%. The left ventricle has normal function. The left ventricle has no regional wall motion abnormalities. Left ventricular diastolic parameters are consistent with Grade I diastolic dysfunction (impaired relaxation).  2. Right ventricular systolic function is normal. The right ventricular size is normal.  3. Prominent epicardial fat.  4. The mitral valve is normal in structure. No evidence of mitral valve regurgitation. No evidence of mitral stenosis.  5. The aortic valve was not well visualized. There is mild calcification of the aortic valve. Aortic valve regurgitation is not visualized. Mild aortic valve sclerosis is present, with no evidence of aortic valve stenosis.  6. The inferior vena cava is normal in size with <50% respiratory variability, suggesting right atrial pressure of 8 mmHg. FINDINGS  Left Ventricle: Left ventricular ejection fraction, by estimation, is 60 to 65%. The left ventricle has normal function. The left ventricle has no regional wall motion abnormalities. The left ventricular internal cavity size was normal in size.  There is  no left ventricular hypertrophy. Left ventricular diastolic parameters are consistent with Grade I diastolic dysfunction (impaired relaxation). Right Ventricle: The right ventricular size is normal. No increase in  right ventricular wall thickness. Right ventricular systolic function is normal. Left Atrium: Left atrial size was normal in size. Right Atrium: Right atrial size was normal in size. Pericardium: Prominent epicardial fat. There is no evidence of pericardial effusion. Mitral Valve: The mitral valve is normal in structure. No evidence of mitral valve regurgitation. No evidence of mitral valve stenosis. Tricuspid Valve: The tricuspid valve is normal in structure. Tricuspid valve regurgitation is not demonstrated. No evidence of tricuspid stenosis. Aortic Valve: The aortic valve was not well visualized. There is mild calcification of the aortic valve. Aortic valve regurgitation is not visualized. Mild aortic valve sclerosis is present, with no evidence of aortic valve stenosis. Pulmonic Valve: The pulmonic valve was normal in structure. Pulmonic valve regurgitation is not visualized. No evidence of pulmonic stenosis. Aorta: The aortic root is normal in size and structure. Venous: The inferior vena cava is normal in size with less than 50% respiratory variability, suggesting right atrial pressure of 8 mmHg. IAS/Shunts: No atrial level shunt detected by color flow Doppler.  LEFT VENTRICLE PLAX 2D LVIDd:         3.90 cm  Diastology LVIDs:         2.80 cm  LV e' medial:    7.40 cm/s LV PW:         1.00 cm  LV E/e' medial:  8.6 LV IVS:        0.80 cm  LV e' lateral:   6.31 cm/s LVOT diam:     2.00 cm  LV E/e' lateral: 10.0 LV SV:         57 LV SV Index:   29 LVOT Area:     3.14 cm  RIGHT VENTRICLE RV S prime:     11.70 cm/s TAPSE (M-mode): 2.6 cm LEFT ATRIUM             Index       RIGHT ATRIUM           Index LA diam:        2.20 cm 1.13 cm/m  RA Area:     11.30 cm LA Vol (A2C):   45.4 ml 23.36 ml/m  RA Volume:   23.30 ml  11.99 ml/m LA Vol (A4C):   23.1 ml 11.89 ml/m LA Biplane Vol: 32.6 ml 16.78 ml/m  AORTIC VALVE LVOT Vmax:   91.40 cm/s LVOT Vmean:  62.800 cm/s LVOT VTI:    0.181 m  AORTA Ao Root diam: 3.60 cm Ao Asc diam:  3.10 cm MITRAL VALVE MV Area (PHT): 4.33 cm    SHUNTS MV Decel Time: 175 msec    Systemic VTI:  0.18 m MV E velocity: 63.40 cm/s  Systemic Diam: 2.00 cm MV A velocity: 83.60 cm/s MV E/A ratio:  0.76 Jenkins Rouge MD Electronically signed by Jenkins Rouge MD Signature Date/Time: 10/01/2020/10:44:38 AM    Final    VAS US CAROTID  Result Date: 10/01/2020 Carotid Arterial Duplex Study Patient Name:  Patricia [name]  Date of Exam:   10/01/2020 Medical Rec #: [id number]           Accession #:    [id number] Date of Birth: [birth year]            Patient Gender: F Patient Age:   17 years Exam Location:  [facility] Procedure:      VAS US CAROTID Referring Phys: ANAND HONGALGI --------------------------------------------------------------------------------  Indications:      Syncope. Risk Factors:     Hypertension, hyperlipidemia,  past history of smoking, prior                   CVA. Comparison Study: No prior studies. Performing Technologist: Darlin Coco RDMS, RVT  Examination Guidelines: A complete evaluation includes B-mode imaging, spectral Doppler, color Doppler, and power Doppler as needed of all accessible portions of each vessel. Bilateral testing is considered an integral part of a complete examination. Limited examinations for reoccurring indications may be performed as noted.  Right Carotid Findings: +----------+--------+--------+--------+-------------------+--------+           PSV cm/sEDV cm/sStenosisPlaque Description Comments +----------+--------+--------+--------+-------------------+--------+ CCA Prox  56      17                                          +----------+--------+--------+--------+-------------------+--------+ CCA Distal52      19                                           +----------+--------+--------+--------+-------------------+--------+ ICA Prox  52      26      1-39%   calcific and smooth         +----------+--------+--------+--------+-------------------+--------+ ICA Distal87      30                                          +----------+--------+--------+--------+-------------------+--------+ ECA       45      9                                           +----------+--------+--------+--------+-------------------+--------+ +----------+--------+-------+----------------+-------------------+           PSV cm/sEDV cmsDescribe        Arm Pressure (mmHG) +----------+--------+-------+----------------+-------------------+ [id number]             Multiphasic, WNL                    +----------+--------+-------+----------------+-------------------+ +---------+--------+--+--------+--+---------+ VertebralPSV cm/s57EDV cm/s18Antegrade +---------+--------+--+--------+--+---------+  Left Carotid Findings: +----------+--------+--------+--------+----------------------+--------+           PSV cm/sEDV cm/sStenosisPlaque Description    Comments +----------+--------+--------+--------+----------------------+--------+ CCA Prox  76      18                                             +----------+--------+--------+--------+----------------------+--------+ CCA Distal66      18                                             +----------+--------+--------+--------+----------------------+--------+ ICA Prox  37      13      1-39%   focal and heterogenous         +----------+--------+--------+--------+----------------------+--------+ ICA Distal61      23                                             +----------+--------+--------+--------+----------------------+--------+  ECA       58                                                     +----------+--------+--------+--------+----------------------+--------+  +----------+--------+--------+----------------+-------------------+           PSV cm/sEDV cm/sDescribe        Arm Pressure (mmHG) +----------+--------+--------+----------------+-------------------+ [id number]              Multiphasic, WNL                    +----------+--------+--------+----------------+-------------------+ +---------+--------+--+--------+-+---------+ VertebralPSV cm/s39EDV cm/s9Antegrade +---------+--------+--+--------+-+---------+   Summary: Right Carotid: Velocities in the right ICA are consistent with a 1-39% stenosis. Left Carotid: Velocities in the left ICA are consistent with a 1-39% stenosis. Vertebrals:  Bilateral vertebral arteries demonstrate antegrade flow. Subclavians: Normal flow hemodynamics were seen in bilateral subclavian              arteries. *See table(s) above for measurements and observations.  Electronically signed by Servando Snare MD on 10/01/2020 at 5:26:53 PM.    Final     Cardiac Studies     Patient Profile     Patricia [name] is a 70 y.o. female with a hx of PSVT and afib who is being seen 10/01/2020 for the evaluation of syncope at the request of Dr Algis Liming.  Assessment & Plan    1.Syncope -orthostatics done at 130 AM off patterns. SBP and DBP increased lying to sitting, both decreased significantly sitting to standing sbp 166->140 and dbp 96 to 81. HR sitting 81, after standing 3 minutes up to 150. SHe was significnatly hyponatremic on admission, Na improved with IVFs. She reports frequent loose watery stools over the last week - EKG shows SVT, tele review shows SR and SVT to 140s at times. No heart rhythms detected so far would account for sycnope.    -I suspect her syncope is due to hypovolemia - continue to follow tele today and tomorrow - encouraged increased regular hydration, particularly when having loose stools.  - if recurrent syncope over time with maintaing hydration would repeat monitor   2.PSVT - episodes of SVT  here, looks most like atach - lopressor increased this admit, now on 25mg  tid - still some runs of SVT but less burden, change lopressor to 50mg  bid  -K 3.6 will give 40 mEq PO today, Mg 1.6 yesterday was replaced     3. Afib - continue beta blocker, eliquis.      4. Hyponatremia - secondary to GI lossess, poor oral intake - improved with IVFs though trending back down    She has chronic PSVT and is overall asymptomatic during episodes, she very much wants to go home today. Would see how rates do with higher lopressor this AM, if episodes remain infrequent would be ok for discharge. We will arrange f/u with Dr Curt Bears office.      For questions or updates, please contact [place] Please consult [email or web address] for contact info under        Signed, Carlyle Dolly, MD  10/02/2020, 7:28 AM

## 2020-10-03 ENCOUNTER — Ambulatory Visit: Payer: Medicare HMO | Admitting: Internal Medicine

## 2020-10-03 DIAGNOSIS — E871 Hypo-osmolality and hyponatremia: Secondary | ICD-10-CM

## 2020-10-03 LAB — BASIC METABOLIC PANEL
Anion gap: 6 (ref 5–15)
BUN: 13 mg/dL (ref 8–23)
CO2: 26 mmol/L (ref 22–32)
Calcium: 8.7 mg/dL — ABNORMAL LOW (ref 8.9–10.3)
Chloride: 93 mmol/L — ABNORMAL LOW (ref 98–111)
Creatinine, Ser: 0.58 mg/dL (ref 0.44–1.00)
GFR, Estimated: >60 mL/min (ref >60)
Glucose, Bld: 103 mg/dL — ABNORMAL HIGH (ref 70–99)
Potassium: 4.7 mmol/L (ref 3.5–5.1)
Sodium: 125 mmol/L — ABNORMAL LOW (ref 135–145)

## 2020-10-03 LAB — URINALYSIS, ROUTINE W REFLEX MICROSCOPIC
Bilirubin Urine: NEGATIVE
Glucose, UA: NEGATIVE mg/dL
Hgb urine dipstick: NEGATIVE
Ketones, ur: NEGATIVE mg/dL
Leukocytes,Ua: NEGATIVE
Nitrite: NEGATIVE
Protein, ur: NEGATIVE mg/dL
Specific Gravity, Urine: >1.03 — ABNORMAL HIGH (ref 1.005–1.030)
pH: 6 (ref 5.0–8.0)

## 2020-10-03 LAB — MAGNESIUM: Magnesium: 1.8 mg/dL (ref 1.7–2.4)

## 2020-10-03 LAB — SODIUM, URINE, RANDOM: Sodium, Ur: 133 mmol/L

## 2020-10-03 LAB — OSMOLALITY: Osmolality: 262 mOsm/kg — ABNORMAL LOW (ref 275–295)

## 2020-10-03 MED ORDER — SODIUM CHLORIDE 0.9 % IV SOLN: INTRAVENOUS | Status: AC

## 2020-10-03 NOTE — Progress Notes (Deleted)
Patricia [name], female    DOB: 1950-03-01,    MRN: [id number]   Brief patient profile:  56 yowf  quit smoking 12/2019   referred to pulmonary clinic 08/11/2020 by Dr  Kenton Kingfisher for cough.   Healthy child in her 100s some am and hs cough then stopped smoking due to SVT rx lopressor then w/in a few days onset of worse cough than baseline with green mucus production in am and all no better with pred or abx      History of Present Illness  08/11/2020  Pulmonary/ 1st office eval/Cartina Brousseau  Chief Complaint  Patient presents with   Consult    Referred by Dr. Gwyndolyn Saxon for persistent cough times 8 months. Coughing green sputum.  Dyspnea:  worse in heat and p  20 lb wt, but still able to do shopping fine / pushing cart ok  Cough: first thing in am - better on ppi by Redmond Baseman but still green, no change p 2 courses of abx by Dr Kenton Kingfisher  Sleep: bed is flat, one pillow SABA use: albuterol helps some for a few hour Rec Plan A = Automatic = Always=    symbicort 80 (or dulera100) Take 2 puffs first thing in am and then another 2 puffs about 12 hours later.   Work on inhaler technique:  Plan B = Backup (to supplement plan A, not to replace it) Only use your albuterol inhaler (ventolin) as a rescue medication Omnicef 300 mg twice daily x 10days  For mucinex dm 1200 mg every 12 hours as needed  Prilosec 20mg   Take 30-60 min before first meal of the day   ct scan sinus>>>  neg  Please schedule a follow up office visit in 6 weeks, call sooner if needed - bring your inhalers with you PFT's on return    09/19/20 televisit re ? Thrush Rec For yeast infections >  diflucan 100 mg as needed  Hold off on using the symbicort for now - if breathing getting worse ok to resume one puff twice daily    10/03/2020  f/u ov/Patric Buckhalter re: ***   maint on ***  No chief complaint on file.   Dyspnea:  *** Cough: *** Sleeping: *** SABA use: *** 02: *** Covid status:   ***   No obvious day to day or daytime variability or assoc  excess/ purulent sputum or mucus plugs or hemoptysis or cp or chest tightness, subjective wheeze or overt sinus or hb symptoms.   *** without nocturnal  or early am exacerbation  of respiratory  c/o's or need for noct saba. Also denies any obvious fluctuation of symptoms with weather or environmental changes or other aggravating or alleviating factors except as outlined above   No unusual exposure hx or h/o childhood pna/ asthma or knowledge of premature birth.  Current Allergies, Complete Past Medical History, Past Surgical History, Family History, and Social History were reviewed in [employer] record.  ROS  The following are not active complaints unless bolded Hoarseness, sore throat, dysphagia, dental problems, itching, sneezing,  nasal congestion or discharge of excess mucus or purulent secretions, ear ache,   fever, chills, sweats, unintended wt loss or wt gain, classically pleuritic or exertional cp,  orthopnea pnd or arm/hand swelling  or leg swelling, presyncope, palpitations, abdominal pain, anorexia, nausea, vomiting, diarrhea  or change in bowel habits or change in bladder habits, change in stools or change in urine, dysuria, hematuria,  rash, arthralgias, visual complaints, headache, numbness, weakness  or ataxia or problems with walking or coordination,  change in mood or  memory.        No outpatient medications have been marked as taking for the 10/03/20 encounter (Appointment) with Tanda Rockers, MD.             Past Medical History:  Diagnosis Date   Anxiety    GERD (gastroesophageal reflux disease)    Hyperlipidemia    Hypertension    IBS (irritable bowel syndrome)    Insomnia    Low back pain    TIA (transient ischemic attack) 10/2017   no deficits   Vitamin B12 deficiency    Vitamin D deficiency       Objective:    Wt Readings from Last 3 Encounters:  09/30/20 182 lb 1.6 oz (82.6 kg)  08/11/20 185 lb 6.4 oz (84.1 kg)  05/10/20 179 lb  6.4 oz (81.4 kg)      Vital signs reviewed  10/03/2020  - Note at rest 02 sats  ***% on ***   General appearance:    ***     .     Assessment

## 2020-10-03 NOTE — Progress Notes (Signed)
PROGRESS NOTE   Patricia [name]  PNT:[id number]    DOB: [birth year]    DOA: 09/30/2020  PCP: [name], MD   I have briefly reviewed patients previous medical records in [facility]  Chief Complaint  Patient presents with   Loss of Consciousness    Brief Narrative:  70 year old married female, independent, medical history significant for SVT, PAF on Eliquis, HTN, HLD, TIA/CVAs, IBS, anxiety, GERD, chronic low back pain, former smoker, prior syncope, presented to the ED after a syncopal episode and fall at home.  Admitted for syncope, noted paroxysmal SVT versus A. fib with RVR in the 140s.  Cardiology assisting with management of SVT.  Nephrology consulted for progressively worsening hyponatremia.   Assessment & Plan:  Principal Problem:   Hyponatremia Active Problems:   Syncope   Essential hypertension   Syncope with collapse: Patient reports a milder episode of syncope in December 2021 in the context of symptomatic SVT December 2021, aborted with adenosine 6 mg by paramedics.  Sees EP cardiology/Dr. Curt Bears.  Evaluation with cardiac monitor showed SVT and 2% burden of PAF.  Compliant with low-dose beta-blockers and Eliquis.  Unclear if this was her precipitant on admission.  On telemetry has very frequent episodes of sustained narrow complex tachycardia up to 140s, SVT versus A. fib with RVR.  CT head: Negative.  Continue telemetry.  TTE 10/01/2020: LVEF 08-67%, grade 1 diastolic dysfunction, no aortic stenosis.  Carotid Dopplers: Bilateral 1-39% ICA stenosis.  Orthostatics negative.  Potassium and magnesium okay.  Cardiology follow-up appreciated and metoprolol was titrated up to 50 mg twice daily.  Infrequent and mild episodes of PSVT.  Cardiology signed off.  TSH 1.037.  Patient has been counseled that she should not drive for 6 months and she has verbalized understanding.  Overall syncope felt to be due to dehydration from GI losses.  Paroxysmal SVT/A. fib with RVR:  Management as above.  TTE: LVEF 60-65%.  Metoprolol has been titrated up to 50 mg twice daily.  Continue Eliquis for A. fib.  Cardiology signed off 9/26.  Hyponatremia: May be related to some dehydration due to overall poor oral intake yesterday.  Serum sodium has gradually dropped down to 125.  Reports drinking up to 2-3 L of free water at home.  Serum osmolarity 262.  Urine sodium and osmolarity pending.  Nephrology input appreciated and suspect SIADH, recommend holding ARB and continued monitoring of serum sodium level.  Fluid restriction.  Checking cortisol level.  Essential hypertension: Controlled on metoprolol.  Losartan held due to hyponatremia.  Hyperlipidemia: Continue statins  IBS/diarrhea: Continue Linzess-dose reduced by half due to diarrhea, MiraLAX and as needed Levsin.  Diarrhea improved.  GERD: Continue PPI  Chronic pain: Continue Soma, gabapentin and tramadol.  Anxiety: Continue home dose of clonazepam  Asthmatic bronchitis: Stable.  Continue home regimen.  Follows with outpatient pulmonology  Body mass index is 28.52 kg/m.    DVT prophylaxis:   On full dose Eliquis   Code Status: Full Code Family Communication: Discussed in detail with patient's daughter at bedside. Disposition:  Status is: Observation  The patient will require care spanning > 2 midnights and should be moved to inpatient because: IV treatments appropriate due to intensity of illness or inability to take PO  Dispo: The patient is from: Home              Anticipated d/c is to: Home pending improvement and stabilization of low sodium.  Patient currently is not medically stable to d/c.   Difficult to place patient No        Consultants:   Cardiology Nephrology.  Procedures:   None  Antimicrobials:    Anti-infectives (From admission, onward)    None         Subjective:  Occasional but infrequent and less severe feelings of "sinking" compared to 2 to 3 days ago.   Diarrhea improved.  No palpitations and denies any other complaints.  Objective:   Vitals:   10/03/20 0540 10/03/20 0834 10/03/20 0835 10/03/20 1229  BP: 140/88   (!) 144/76  Pulse: 69   61  Resp: 18   18  Temp: 98 F (36.7 C)   98 F (36.7 C)  TempSrc: Oral   Oral  SpO2: 100% 100% 100% 100%  Weight:      Height:        General exam: Elderly female, moderately built and nourished sitting up comfortably in reclining chair. Respiratory system: Clear to auscultation.  No increased work of breathing. Cardiovascular system: S1 and S2 heard, RRR.  No JVD, murmurs or pedal edema.  Telemetry personally reviewed, sinus rhythm.  Occasional and infrequent and milder episodes of paroxysmal SVT may be 1 or 2 with heart rates up in the 120s and lasting for about 4 minutes or so.  This is much improved compared to 48 hours ago. Gastrointestinal system: Abdomen is nondistended, soft and nontender. No organomegaly or masses felt. Normal bowel sounds heard. Central nervous system: Alert and oriented. No focal neurological deficits. Extremities: Symmetric 5 x 5 power. Skin: No rashes, lesions or ulcers Psychiatry: Judgement and insight appear normal. Mood & affect appropriate. Neck: No carotid bruit.    Data Reviewed:   I have personally reviewed following labs and imaging studies   CBC: Recent Labs  Lab 09/30/20 1812  WBC 6.6  HGB 14.4  HCT 40.3  MCV 96.0  PLT 709    Basic Metabolic Panel: Recent Labs  Lab 10/01/20 1319 10/02/20 0426 10/02/20 1508 10/03/20 0430  NA  --  128* 127* 125*  K  --  3.6 4.5 4.7  CL  --  95* 95* 93*  CO2  --  24 28 26   GLUCOSE  --  113* 108* 103*  BUN  --  10 11 13   CREATININE  --  0.54 0.79 0.58  CALCIUM  --  8.7* 9.1 8.7*  MG 1.6*  --   --  1.8    Liver Function Tests: No results for input(s): AST, ALT, ALKPHOS, BILITOT, PROT, ALBUMIN in the last 168 hours.  CBG: No results for input(s): GLUCAP in the last 168 hours.  Microbiology  Studies:   Recent Results (from the past 240 hour(s))  Resp Panel by RT-PCR (Flu A&B, Covid) Nasopharyngeal Swab     Status: None   Collection Time: 09/30/20  7:50 PM   Specimen: Nasopharyngeal Swab; Nasopharyngeal(NP) swabs in vial transport medium  Result Value Ref Range Status   SARS Coronavirus 2 by RT PCR NEGATIVE NEGATIVE Final    Comment: (NOTE) SARS-CoV-2 target nucleic acids are NOT DETECTED.  The SARS-CoV-2 RNA is generally detectable in upper respiratory specimens during the acute phase of infection. The lowest concentration of SARS-CoV-2 viral copies this assay can detect is 138 copies/mL. A negative result does not preclude SARS-Cov-2 infection and should not be used as the sole basis for treatment or other patient management decisions. A negative result may occur with  improper specimen  collection/handling, submission of specimen other than nasopharyngeal swab, presence of viral mutation(s) within the areas targeted by this assay, and inadequate number of viral copies(<138 copies/mL). A negative result must be combined with clinical observations, patient history, and epidemiological information. The expected result is Negative.  Fact Sheet for Patients:  EntrepreneurPulse.com.au  Fact Sheet for Healthcare Providers:  IncredibleEmployment.be  This test is no t yet approved or cleared by the [place] FDA and  has been authorized for detection and/or diagnosis of SARS-CoV-2 by FDA under an Emergency Use Authorization (EUA). This EUA will remain  in effect (meaning this test can be used) for the duration of the COVID-19 declaration under Section 564(b)(1) of the Act, 21 U.S.C.section 360bbb-3(b)(1), unless the authorization is terminated  or revoked sooner.       Influenza A by PCR NEGATIVE NEGATIVE Final   Influenza B by PCR NEGATIVE NEGATIVE Final    Comment: (NOTE) The Xpert Xpress SARS-CoV-2/FLU/RSV plus assay is  intended as an aid in the diagnosis of influenza from Nasopharyngeal swab specimens and should not be used as a sole basis for treatment. Nasal washings and aspirates are unacceptable for Xpert Xpress SARS-CoV-2/FLU/RSV testing.  Fact Sheet for Patients: EntrepreneurPulse.com.au  Fact Sheet for Healthcare Providers: IncredibleEmployment.be  This test is not yet approved or cleared by the [place] FDA and has been authorized for detection and/or diagnosis of SARS-CoV-2 by FDA under an Emergency Use Authorization (EUA). This EUA will remain in effect (meaning this test can be used) for the duration of the COVID-19 declaration under Section 564(b)(1) of the Act, 21 U.S.C. section 360bbb-3(b)(1), unless the authorization is terminated or revoked.  Performed at [facility], [address], [place], [place] [address]      Radiology Studies:  No results found.   Scheduled Meds:    apixaban  5 mg Oral BID   carisoprodol  350 mg Oral TID   cholecalciferol  2,000 Units Oral Daily   clonazePAM  1 mg Oral QHS   gabapentin  600 mg Oral BID WC   linaclotide  145 mcg Oral q1600   metoprolol tartrate  50 mg Oral BID   mometasone-formoterol  2 puff Inhalation BID   pantoprazole  40 mg Oral Daily   rosuvastatin  5 mg Oral Q M,W,F    Continuous Infusions:    sodium chloride 50 mL/hr at 10/02/20 2135     LOS: 2 days     Vernell Leep, MD, [place], [facility]. [facility]    To contact the attending provider between 7A-7P or the covering provider during after hours 7P-7A, please log into the web site [email or web address] and access using universal [place] password for that web site. If you do not have the password, please call the hospital operator.  10/03/2020, 4:27 PM

## 2020-10-03 NOTE — Plan of Care (Signed)
  Problem: Clinical Measurements: Goal: Diagnostic test results will improve Outcome: Progressing Goal: Cardiovascular complication will be avoided Outcome: Progressing   Problem: Coping: Goal: Level of anxiety will decrease Outcome: Progressing   Problem: Pain Managment: Goal: General experience of comfort will improve Outcome: Progressing   Problem: Safety: Goal: Ability to remain free from injury will improve Outcome: Progressing   Problem: Nutrition: Goal: Adequate nutrition will be maintained Outcome: Adequate for Discharge

## 2020-10-03 NOTE — Progress Notes (Addendum)
Progress Note  Patient Name: Patricia [name] Date of Encounter: 10/03/2020  [facility] Cardiologist: Dr Curt Bears EP  Subjective   Patient states she feels fine today. She is no longer having diarrhea after reduced dosage of Linzess. She denied recurrence of syncope or lightheadedness while here. She denied any episode of heart palpitation, dizziness, chest pain, heart racing sensation over the past 24 hours. She wants to go home. She endorses some mild headache.   Inpatient Medications    Scheduled Meds:  apixaban  5 mg Oral BID   carisoprodol  350 mg Oral TID   cholecalciferol  2,000 Units Oral Daily   clonazePAM  1 mg Oral QHS   gabapentin  600 mg Oral BID WC   linaclotide  145 mcg Oral q1600   losartan  100 mg Oral Daily   metoprolol tartrate  50 mg Oral BID   mometasone-formoterol  2 puff Inhalation BID   pantoprazole  40 mg Oral Daily   rosuvastatin  5 mg Oral Q M,W,F   Continuous Infusions:  sodium chloride 50 mL/hr at 10/02/20 2135   PRN Meds: acetaminophen **OR** acetaminophen, albuterol, albuterol, fluticasone, hyoscyamine, polyvinyl alcohol, traMADol   Vital Signs    Vitals:   10/02/20 2043 10/03/20 0540 10/03/20 0834 10/03/20 0835  BP:  140/88    Pulse:  69    Resp:  18    Temp:  98 F (36.7 C)    TempSrc:  Oral    SpO2: 97% 100% 100% 100%  Weight:      Height:        Intake/Output Summary (Last 24 hours) at 10/03/2020 0926 Last data filed at 10/03/2020 0600 Gross per 24 hour  Intake 1020.2 ml  Output --  Net 1020.2 ml   Last 3 Weights 09/30/2020 08/11/2020 08/11/2020  Weight (lbs) 182 lb 1.6 oz 185 lb 6.4 oz 185 lb 6.4 oz  Weight (kg) 82.6 kg 84.097 kg 84.097 kg      Telemetry    Sinus rhythm 70s this AM, brief episodes 5-10 minutes of atrial tachycardia with ventricular rate up 120s noted yesterday afternoon/evening and early morning around 3am over the past 24 hours - Personally Reviewed  ECG    N/A this AM - Personally  Reviewed  Physical Exam   GEN: No acute distress. Oral cavity moist.   Neck: No JVD Cardiac: RRR, no murmurs, rubs, or gallops.  Respiratory: Clear to auscultation bilaterally. On room air. Speaks full sentence.  GI: Soft, nontender, non-distended  MS: No edema; No deformity. Neuro:  Nonfocal  Psych: Mild anxiety   Labs    High Sensitivity Troponin:   Recent Labs  Lab 09/30/20 1812 09/30/20 1950  TROPONINIHS 3 4     Chemistry Recent Labs  Lab 10/01/20 1319 10/02/20 0426 10/02/20 1508 10/03/20 0430  NA  --  128* 127* 125*  K  --  3.6 4.5 4.7  CL  --  95* 95* 93*  CO2  --  24 28 26   GLUCOSE  --  113* 108* 103*  BUN  --  10 11 13   CREATININE  --  0.54 0.79 0.58  CALCIUM  --  8.7* 9.1 8.7*  MG 1.6*  --   --  1.8  GFRNONAA  --  >60 >60 >60  ANIONGAP  --  9 4* 6    Lipids No results for input(s): CHOL, TRIG, HDL, LABVLDL, LDLCALC, CHOLHDL in the last 168 hours.  Hematology Recent Labs  Lab 09/30/20  1812  WBC 6.6  RBC 4.20  HGB 14.4  HCT 40.3  MCV 96.0  MCH 34.3*  MCHC 35.7  RDW 13.3  PLT 287   Thyroid  Recent Labs  Lab 10/01/20 1319  TSH 1.037    BNPNo results for input(s): BNP, PROBNP in the last 168 hours.  DDimer No results for input(s): DDIMER in the last 168 hours.   Radiology    ECHOCARDIOGRAM COMPLETE  Result Date: 10/01/2020    ECHOCARDIOGRAM REPORT   Patient Name:   Patricia [name] Date of Exam: 10/01/2020 Medical Rec #:  [id number]       Height:       67.0 in Accession #:    [id number]      Weight:       182.1 lb Date of Birth:  July 23, 1950        BSA:          1.943 m Patient Age:    70 years        BP:           150/82 mmHg Patient Gender: F               HR:           90 bpm. Exam Location:  Inpatient Procedure: 2D Echo, Color Doppler and Cardiac Doppler Indications:    R55 Syncope  History:        Patient has prior history of Echocardiogram examinations, most                 recent 10/25/2017. Risk Factors:Hypertension and Dyslipidemia.   Sonographer:    Raquel Sarna Senior RDCS Referring Phys: [place]  1. Left ventricular ejection fraction, by estimation, is 60 to 65%. The left ventricle has normal function. The left ventricle has no regional wall motion abnormalities. Left ventricular diastolic parameters are consistent with Grade I diastolic dysfunction (impaired relaxation).  2. Right ventricular systolic function is normal. The right ventricular size is normal.  3. Prominent epicardial fat.  4. The mitral valve is normal in structure. No evidence of mitral valve regurgitation. No evidence of mitral stenosis.  5. The aortic valve was not well visualized. There is mild calcification of the aortic valve. Aortic valve regurgitation is not visualized. Mild aortic valve sclerosis is present, with no evidence of aortic valve stenosis.  6. The inferior vena cava is normal in size with <50% respiratory variability, suggesting right atrial pressure of 8 mmHg. FINDINGS  Left Ventricle: Left ventricular ejection fraction, by estimation, is 60 to 65%. The left ventricle has normal function. The left ventricle has no regional wall motion abnormalities. The left ventricular internal cavity size was normal in size. There is  no left ventricular hypertrophy. Left ventricular diastolic parameters are consistent with Grade I diastolic dysfunction (impaired relaxation). Right Ventricle: The right ventricular size is normal. No increase in right ventricular wall thickness. Right ventricular systolic function is normal. Left Atrium: Left atrial size was normal in size. Right Atrium: Right atrial size was normal in size. Pericardium: Prominent epicardial fat. There is no evidence of pericardial effusion. Mitral Valve: The mitral valve is normal in structure. No evidence of mitral valve regurgitation. No evidence of mitral valve stenosis. Tricuspid Valve: The tricuspid valve is normal in structure. Tricuspid valve regurgitation is not demonstrated.  No evidence of tricuspid stenosis. Aortic Valve: The aortic valve was not well visualized. There is mild calcification of the aortic valve. Aortic valve regurgitation is not  visualized. Mild aortic valve sclerosis is present, with no evidence of aortic valve stenosis. Pulmonic Valve: The pulmonic valve was normal in structure. Pulmonic valve regurgitation is not visualized. No evidence of pulmonic stenosis. Aorta: The aortic root is normal in size and structure. Venous: The inferior vena cava is normal in size with less than 50% respiratory variability, suggesting right atrial pressure of 8 mmHg. IAS/Shunts: No atrial level shunt detected by color flow Doppler.  LEFT VENTRICLE PLAX 2D LVIDd:         3.90 cm  Diastology LVIDs:         2.80 cm  LV e' medial:    7.40 cm/s LV PW:         1.00 cm  LV E/e' medial:  8.6 LV IVS:        0.80 cm  LV e' lateral:   6.31 cm/s LVOT diam:     2.00 cm  LV E/e' lateral: 10.0 LV SV:         57 LV SV Index:   29 LVOT Area:     3.14 cm  RIGHT VENTRICLE RV S prime:     11.70 cm/s TAPSE (M-mode): 2.6 cm LEFT ATRIUM             Index       RIGHT ATRIUM           Index LA diam:        2.20 cm 1.13 cm/m  RA Area:     11.30 cm LA Vol (A2C):   45.4 ml 23.36 ml/m RA Volume:   23.30 ml  11.99 ml/m LA Vol (A4C):   23.1 ml 11.89 ml/m LA Biplane Vol: 32.6 ml 16.78 ml/m  AORTIC VALVE LVOT Vmax:   91.40 cm/s LVOT Vmean:  62.800 cm/s LVOT VTI:    0.181 m  AORTA Ao Root diam: 3.60 cm Ao Asc diam:  3.10 cm MITRAL VALVE MV Area (PHT): 4.33 cm    SHUNTS MV Decel Time: 175 msec    Systemic VTI:  0.18 m MV E velocity: 63.40 cm/s  Systemic Diam: 2.00 cm MV A velocity: 83.60 cm/s MV E/A ratio:  0.76 Jenkins Rouge MD Electronically signed by Jenkins Rouge MD Signature Date/Time: 10/01/2020/10:44:38 AM    Final    VAS US CAROTID  Result Date: 10/01/2020 Carotid Arterial Duplex Study Patient Name:  NASHALIE SALLIS  Date of Exam:   10/01/2020 Medical Rec #: [id number]           Accession #:     [id number] Date of Birth: 1950-11-30            Patient Gender: F Patient Age:   28 years Exam Location:  [facility] Procedure:      VAS US CAROTID Referring Phys: ANAND HONGALGI --------------------------------------------------------------------------------  Indications:      Syncope. Risk Factors:     Hypertension, hyperlipidemia, past history of smoking, prior                   CVA. Comparison Study: No prior studies. Performing Technologist: Darlin Coco RDMS, RVT  Examination Guidelines: A complete evaluation includes B-mode imaging, spectral Doppler, color Doppler, and power Doppler as needed of all accessible portions of each vessel. Bilateral testing is considered an integral part of a complete examination. Limited examinations for reoccurring indications may be performed as noted.  Right Carotid Findings: +----------+--------+--------+--------+-------------------+--------+           PSV cm/sEDV cm/sStenosisPlaque Description Comments +----------+--------+--------+--------+-------------------+--------+  CCA Prox  56      17                                          +----------+--------+--------+--------+-------------------+--------+ CCA Distal52      19                                          +----------+--------+--------+--------+-------------------+--------+ ICA Prox  52      26      1-39%   calcific and smooth         +----------+--------+--------+--------+-------------------+--------+ ICA Distal87      30                                          +----------+--------+--------+--------+-------------------+--------+ ECA       45      9                                           +----------+--------+--------+--------+-------------------+--------+ +----------+--------+-------+----------------+-------------------+           PSV cm/sEDV cmsDescribe        Arm Pressure (mmHG) +----------+--------+-------+----------------+-------------------+ [id number]              Multiphasic, WNL                    +----------+--------+-------+----------------+-------------------+ +---------+--------+--+--------+--+---------+ VertebralPSV cm/s57EDV cm/s18Antegrade +---------+--------+--+--------+--+---------+  Left Carotid Findings: +----------+--------+--------+--------+----------------------+--------+           PSV cm/sEDV cm/sStenosisPlaque Description    Comments +----------+--------+--------+--------+----------------------+--------+ CCA Prox  76      18                                             +----------+--------+--------+--------+----------------------+--------+ CCA Distal66      18                                             +----------+--------+--------+--------+----------------------+--------+ ICA Prox  37      13      1-39%   focal and heterogenous         +----------+--------+--------+--------+----------------------+--------+ ICA Distal61      23                                             +----------+--------+--------+--------+----------------------+--------+ ECA       58                                                     +----------+--------+--------+--------+----------------------+--------+ +----------+--------+--------+----------------+-------------------+           PSV cm/sEDV cm/sDescribe  Arm Pressure (mmHG) +----------+--------+--------+----------------+-------------------+ [id number]              Multiphasic, WNL                    +----------+--------+--------+----------------+-------------------+ +---------+--------+--+--------+-+---------+ VertebralPSV cm/s39EDV cm/s9Antegrade +---------+--------+--+--------+-+---------+   Summary: Right Carotid: Velocities in the right ICA are consistent with a 1-39% stenosis. Left Carotid: Velocities in the left ICA are consistent with a 1-39% stenosis. Vertebrals:  Bilateral vertebral arteries demonstrate antegrade flow. Subclavians: Normal  flow hemodynamics were seen in bilateral subclavian              arteries. *See table(s) above for measurements and observations.  Electronically signed by Servando Snare MD on 10/01/2020 at 5:26:53 PM.    Final     Cardiac Studies   Echo from 10/01/20:   1. Left ventricular ejection fraction, by estimation, is 60 to 65%. The  left ventricle has normal function. The left ventricle has no regional  wall motion abnormalities. Left ventricular diastolic parameters are  consistent with Grade I diastolic dysfunction (impaired relaxation).   2. Right ventricular systolic function is normal. The right ventricular  size is normal.   3. Prominent epicardial fat.   4. The mitral valve is normal in structure. No evidence of mitral valve  regurgitation. No evidence of mitral stenosis.   5. The aortic valve was not well visualized. There is mild calcification  of the aortic valve. Aortic valve regurgitation is not visualized. Mild  aortic valve sclerosis is present, with no evidence of aortic valve  stenosis.   6. The inferior vena cava is normal in size with <50% respiratory  variability, suggesting right atrial pressure of 8 mmHg.   2 weeks event monitor 12/08/19:  Max 234 bpm 02:47pm, 12/01 Min 46 bpm 11:11pm, 12/07 Avg 76 bpm Less than 1% ventricular and supraventricular ectopy The predominant underlying rhythm was sinus rhythm 2% atrial fibrillation burden with heart rates of 107 to 234 bpm Triggered events associated with both atrial fibrillation and SVT.   Patient Profile     70 y.o. female with PMH of PSVT, Paroxysmal A fib on metoprolol + eliquis, HTN, HLD, TIA, IBS, anxiety, GERD, back pain, who is admitted for hyponatremia, diarrhea, syncope. Cardiology is following since 10/01/20 for syncope and hx of SVT.    Assessment & Plan    Syncope  - EKG and telemetry showed SVTs since admission, no other arrhythmia accountable for syncope   - orthostatic VS negative 10/01/20  - syncope was  felt due to dehydration/hypovolemia from diarrhea   PSVT - telemetry over the past 24 hours with AT/SVT up to 120s lasting 5-10 mins, asymptomatic  - historically on metoprolol 12.5mg  BID and follows EP Dr Curt Bears  - metoprolol increased to 50mg  BID this admission, may further up-tritiate if needed  - optimize electrolytes, K >4 and Mag >2  - Echo from 10/01/20 grossly unremarkable  - will arrange follow up with EP upon discharge   Paroxysmal A fib - currently in SR with runs of SVTs/Ats over the past 24 hours, asymptomatic  - continue metoprolol 50mg  BID for rate control - continue anticoagulation with Eliquis for CHA2DS2VASc of 5  HTN - BP is overall controlled on metoprolol. Losartan has been stopped.   Acute hyponatremia, worsening - consider further workup for SIADH, etc  Diarrhea IBS Anxiety GERD Chronic back pain HLD - managed per IM     For questions or updates, please contact [facility] Please consult [email or web address] for  contact info under        Signed, Margie Billet, NP  10/03/2020, 9:26 AM    Patient seen and examined with Margie Billet NP.  Agree as above, with the following exceptions and changes as noted below. Feeling well overall, asymptomatic from SVT. Gen: NAD, CV: RRR, no murmurs, Lungs: clear, Abd: soft, Extrem: Warm, well perfused, no edema, Neuro/Psych: alert and oriented x 3, normal mood and affect. All available labs, radiology testing, previous records reviewed. Anticipating nephrology consult for hyponatremia workup per patient. Continue metoprolol tartrate 50 mg BID.  At this time, cardiology will sign off given no significant symptoms. Will arrange outpt follow up with EP which is established. Please call our serviced if needed while patient in hospital.   Elouise Munroe, MD 10/03/20 1:46 PM

## 2020-10-03 NOTE — Telephone Encounter (Signed)
Eliquis 5 mg refill request received. Patient is 70 years old, weight- 82.6 kg, Crea- 0.58 on 10/03/20, Diagnosis-aifb, and last seen by Dr. Curt Bears on 05/10/20. Dose is appropriate based on dosing criteria. Will send in refill to requested pharmacy.

## 2020-10-03 NOTE — Consult Note (Signed)
Reason for Consult: Hyponatremia (acute on chronic) Referring Physician: Vernell Leep MD [facility])  HPI:  70 year old Caucasian woman past medical history significant for hypertension, dyslipidemia, irritable bowel syndrome, history of TIA/CVAs, paroxysmal atrial fibrillation on Eliquis, anxiety and baseline hyponatremia with a sodium level from 133.  She reports brought to the emergency room after an apparent syncopal event - she was noted to have paroxysmal SVT versus A. fib with RVR along with preceding history of diarrhea and poor oral intake.  She is suspected to have had some volume depletion leading up to her current hospitalization and concern is raised with decreasing sodium level that is now down to 125 from 131 over the past 2 days.  Her urine output is not charted.  Past Medical History:  Diagnosis Date   Anxiety    GERD (gastroesophageal reflux disease)    Hyperlipidemia    Hypertension    IBS (irritable bowel syndrome)    Insomnia    Low back pain    TIA (transient ischemic attack) 10/2017   no deficits   Vitamin B12 deficiency    Vitamin D deficiency     Past Surgical History:  Procedure Laterality Date   BACK SURGERY     BREAST EXCISIONAL BIOPSY Left 1997   benign   hemorrhoidecotmy     HERNIA MESH REMOVAL     OPEN REDUCTION INTERNAL FIXATION (ORIF) DISTAL RADIAL FRACTURE Right 11/19/2019   Procedure: OPEN REDUCTION INTERNAL FIXATION (ORIF) DISTAL RADIUS AND ULNA FRACTURE WITH REPAIR AS NECESSARY;  Surgeon: Roseanne Kaufman, MD;  Location: [place];  Service: Orthopedics;  Laterality: Right;  2 hrs Block with IV sedation   ORIF RADIUS & ULNA FRACTURES     TONSILLECTOMY      Family History  Problem Relation Age of Onset   Breast cancer Sister    Breast cancer Maternal Aunt    Breast cancer Paternal Aunt     Social History:  reports that she quit smoking about 9 months ago. Her smoking use included cigarettes. She started smoking about 50 years ago. She has a 50.00  pack-year smoking history. She has never used smokeless tobacco. She reports current alcohol use. She reports that she does not use drugs.  Allergies:  Allergies  Allergen Reactions   Augmentin [Amoxicillin-Pot Clavulanate] Nausea Only and Other (See Comments)    GI upset   Levaquin [Levofloxacin In D5w] Other (See Comments)    Joint problems    Mom [Magnesium Hydroxide] Other (See Comments)    Welts    Oxycodone Other (See Comments)    Nausea     Medications: Prior to Admission:  Medications Prior to Admission  Medication Sig Dispense Refill Last Dose   acetaminophen (TYLENOL) 325 MG tablet 1 tablet as needed.   unknown at unknown   Black Cohosh (REMIFEMIN PO) Take 1 tablet by mouth in the morning and at bedtime.   09/30/2020   budesonide-formoterol (SYMBICORT) 80-4.5 MCG/ACT inhaler Take 2 puffs first thing in am and then another 2 puffs about 12 hours later. 1 each 12 09/30/2020   carboxymethylcellulose (REFRESH PLUS) 0.5 % SOLN Place 1 drop into both eyes 3 (three) times daily as needed (for dryness).    09/30/2020   carisoprodol (SOMA) 350 MG tablet Take 350 mg by mouth 3 (three) times daily.   5 Past Month   Cholecalciferol (VITAMIN D3) 50 MCG (2000 UT) TABS Take 2,000 Units by mouth daily.   09/30/2020   clobetasol ointment (TEMOVATE) 0.05 % Apply 1  application topically See admin instructions. Apply to vaginal area daily as directed   Past Month   clonazePAM (KLONOPIN) 1 MG tablet Take 1 mg by mouth at bedtime.   3 09/30/2020   fluconazole (DIFLUCAN) 100 MG tablet Take 1 tablet (100 mg total) by mouth daily. 3 tablet 0 Past Month   fluticasone (FLONASE) 50 MCG/ACT nasal spray Place 2 sprays into both nostrils daily as needed for allergies or rhinitis.   5 09/30/2020   gabapentin (NEURONTIN) 600 MG tablet Take 600 mg by mouth See admin instructions. Take 600 mg by mouth in the morning and at lunchtime   09/30/2020   hydrOXYzine (ATARAX/VISTARIL) 10 MG tablet Take 10 mg by mouth at  bedtime as needed (for sleep).   Past Week   hyoscyamine (LEVSIN) 0.125 MG tablet Take 0.125 mg by mouth as needed.   09/30/2020   LINZESS 290 MCG CAPS capsule Take 290 mcg by mouth daily at 4 PM.   6 Past Week   losartan (COZAAR) 100 MG tablet Take 100 mg by mouth daily.    09/30/2020   metoprolol tartrate (LOPRESSOR) 25 MG tablet Take 0.5 tablets (12.5 mg total) by mouth 2 (two) times daily. 60 tablet 1 09/30/2020   omeprazole (PRILOSEC) 20 MG capsule Take 20 mg by mouth daily before breakfast.    09/30/2020   polyethylene glycol (MIRALAX / GLYCOLAX) packet Take 34 g by mouth in the morning.    09/30/2020   rosuvastatin (CRESTOR) 5 MG tablet Take 5 mg by mouth 3 (three) times a week. Mondays Wednesdays Fridays   09/30/2020   traMADol (ULTRAM) 50 MG tablet Take 100 mg by mouth as needed.   09/30/2020   VENTOLIN HFA 108 (90 Base) MCG/ACT inhaler Inhale 2 puffs into the lungs every 6 (six) hours as needed for wheezing or shortness of breath.   5 Past Week   zaleplon (SONATA) 10 MG capsule Take 10 mg by mouth at bedtime as needed (for interrupted sleep).    Past Week   cefdinir (OMNICEF) 300 MG capsule Take 1 capsule (300 mg total) by mouth 2 (two) times daily. (Patient not taking: No sig reported) 20 capsule 0 Not Taking   clotrimazole (MYCELEX) 10 MG troche Take 1 tablet (10 mg total) by mouth 4 (four) times daily as needed. (Patient not taking: Reported on 10/01/2020) 20 tablet 0 Completed Course   ondansetron (ZOFRAN) 4 MG tablet Take 1 tablet (4 mg total) by mouth every 8 (eight) hours as needed for nausea or vomiting. (Patient not taking: No sig reported) 30 tablet 1 Not Taking   Scheduled:  apixaban  5 mg Oral BID   carisoprodol  350 mg Oral TID   cholecalciferol  2,000 Units Oral Daily   clonazePAM  1 mg Oral QHS   gabapentin  600 mg Oral BID WC   linaclotide  145 mcg Oral q1600   metoprolol tartrate  50 mg Oral BID   mometasone-formoterol  2 puff Inhalation BID   pantoprazole  40 mg Oral Daily    rosuvastatin  5 mg Oral Q M,W,F    BMP Latest Ref Rng & Units 10/03/2020 10/02/2020 10/02/2020  Glucose 70 - 99 mg/dL 103(H) 108(H) 113(H)  BUN 8 - 23 mg/dL 13 11 10   Creatinine 0.44 - 1.00 mg/dL 0.58 0.79 0.54  Sodium 135 - 145 mmol/L 125(L) 127(L) 128(L)  Potassium 3.5 - 5.1 mmol/L 4.7 4.5 3.6  Chloride 98 - 111 mmol/L 93(L) 95(L) 95(L)  CO2 22 -  32 mmol/L 26 28 24   Calcium 8.9 - 10.3 mg/dL 8.7(L) 9.1 8.7(L)   CBC Latest Ref Rng & Units 09/30/2020 12/14/2019 11/17/2019  WBC 4.0 - 10.5 K/uL 6.6 6.6 8.5  Hemoglobin 12.0 - 15.0 g/dL 14.4 15.1(H) 14.5  Hematocrit 36.0 - 46.0 % 40.3 43.1 42.6  Platelets 150 - 400 K/uL 287 255 260     VAS US CAROTID  Result Date: 10/01/2020 Carotid Arterial Duplex Study Patient Name:  Patricia [name]  Date of Exam:   10/01/2020 Medical Rec #: [id number]           Accession #:    [id number] Date of Birth: 01/08/1951            Patient Gender: F Patient Age:   64 years Exam Location:  [facility] Procedure:      VAS US CAROTID Referring Phys: Everlene Farrier HONGALGI --------------------------------------------------------------------------------  Indications:      Syncope. Risk Factors:     Hypertension, hyperlipidemia, past history of smoking, prior                   CVA. Comparison Study: No prior studies. Performing Technologist: Darlin Coco RDMS, RVT  Examination Guidelines: A complete evaluation includes B-mode imaging, spectral Doppler, color Doppler, and power Doppler as needed of all accessible portions of each vessel. Bilateral testing is considered an integral part of a complete examination. Limited examinations for reoccurring indications may be performed as noted.  Right Carotid Findings: +----------+--------+--------+--------+-------------------+--------+           PSV cm/sEDV cm/sStenosisPlaque Description Comments +----------+--------+--------+--------+-------------------+--------+ CCA Prox  56      17                                           +----------+--------+--------+--------+-------------------+--------+ CCA Distal52      19                                          +----------+--------+--------+--------+-------------------+--------+ ICA Prox  52      26      1-39%   calcific and smooth         +----------+--------+--------+--------+-------------------+--------+ ICA Distal87      30                                          +----------+--------+--------+--------+-------------------+--------+ ECA       45      9                                           +----------+--------+--------+--------+-------------------+--------+ +----------+--------+-------+----------------+-------------------+           PSV cm/sEDV cmsDescribe        Arm Pressure (mmHG) +----------+--------+-------+----------------+-------------------+ [id number]             Multiphasic, WNL                    +----------+--------+-------+----------------+-------------------+ +---------+--------+--+--------+--+---------+ VertebralPSV cm/s57EDV cm/s18Antegrade +---------+--------+--+--------+--+---------+  Left Carotid Findings: +----------+--------+--------+--------+----------------------+--------+           PSV cm/sEDV cm/sStenosisPlaque Description    Comments +----------+--------+--------+--------+----------------------+--------+  CCA Prox  76      18                                             +----------+--------+--------+--------+----------------------+--------+ CCA Distal66      18                                             +----------+--------+--------+--------+----------------------+--------+ ICA Prox  37      13      1-39%   focal and heterogenous         +----------+--------+--------+--------+----------------------+--------+ ICA Distal61      23                                             +----------+--------+--------+--------+----------------------+--------+ ECA       58                                                      +----------+--------+--------+--------+----------------------+--------+ +----------+--------+--------+----------------+-------------------+           PSV cm/sEDV cm/sDescribe        Arm Pressure (mmHG) +----------+--------+--------+----------------+-------------------+ [id number]              Multiphasic, WNL                    +----------+--------+--------+----------------+-------------------+ +---------+--------+--+--------+-+---------+ VertebralPSV cm/s39EDV cm/s9Antegrade +---------+--------+--+--------+-+---------+   Summary: Right Carotid: Velocities in the right ICA are consistent with a 1-39% stenosis. Left Carotid: Velocities in the left ICA are consistent with a 1-39% stenosis. Vertebrals:  Bilateral vertebral arteries demonstrate antegrade flow. Subclavians: Normal flow hemodynamics were seen in bilateral subclavian              arteries. *See table(s) above for measurements and observations.  Electronically signed by Servando Snare MD on 10/01/2020 at 5:26:53 PM.    Final     Review of Systems  Constitutional:  Negative for chills, fatigue and fever.  HENT:  Negative for hearing loss, nosebleeds, sinus pressure, sore throat and trouble swallowing.   Eyes:  Negative for photophobia and visual disturbance.  Respiratory:  Negative for cough, chest tightness and shortness of breath.   Cardiovascular:  Negative for leg swelling.  Gastrointestinal:  Positive for diarrhea. Negative for abdominal pain, blood in stool, nausea and vomiting.  Genitourinary:  Positive for decreased urine volume. Negative for dysuria, flank pain, hematuria and urgency.  Musculoskeletal:  Negative for back pain, gait problem and myalgias.  Neurological:  Positive for syncope and weakness. Negative for tremors.  Blood pressure (!) 144/76, pulse 61, temperature 98 F (36.7 C), temperature source Oral, resp. rate 18, height 5\' 7"  (1.702 m), weight 82.6 kg, SpO2 100  %. Physical Exam Vitals and nursing note reviewed.  Constitutional:      General: She is not in acute distress.    Appearance: She is obese. She is not ill-appearing.  HENT:     Head: Normocephalic and atraumatic.     Right Ear: External ear normal.  Left Ear: External ear normal.     Nose: Nose normal.     Mouth/Throat:     Mouth: Mucous membranes are dry.     Pharynx: Oropharynx is clear.  Eyes:     General: No scleral icterus.    Extraocular Movements: Extraocular movements intact.     Pupils: Pupils are equal, round, and reactive to light.  Cardiovascular:     Rate and Rhythm: Normal rate and regular rhythm.     Pulses: Normal pulses.     Heart sounds: Normal heart sounds.  Pulmonary:     Effort: Pulmonary effort is normal.     Breath sounds: Normal breath sounds.  Abdominal:     General: Abdomen is flat.     Palpations: Abdomen is soft.     Tenderness: There is no abdominal tenderness. There is no guarding.  Musculoskeletal:     Cervical back: Normal range of motion and neck supple.     Right lower leg: No edema.     Left lower leg: No edema.  Skin:    General: Skin is warm and dry.     Findings: No erythema.  Neurological:     General: No focal deficit present.     Mental Status: She is alert and oriented to person, place, and time.  Psychiatric:        Mood and Affect: Mood normal.    Assessment/Plan: 1.  Hyponatremia: This appears to be true hyponatremia with correspondingly low serum osmolality and transient hypovolemia (improved with intravenous fluids)-likely inappropriately elevated ADH (pending confirmation from urine osmolality).  I suspect that she has had a lag with correction of hyponatremia from being on ARB therapy and I will hold it at this time and will continue to follow sodium level.  Based on her blood pressure/symptomatic assessment, her volume status appears to be back to baseline and I will institute fluid restriction.  Her TSH level was  within normal range and I will check a cortisol level for evaluation of adrenal insufficiency. 2.  Syncopal event: With SVT noted since admission and suspected to be largely from volume contraction secondary to GI losses.  Beta-blockers adjusted for management of paroxysmal SVT. 3.  Hypertension: Blood pressure currently well controlled with titration of metoprolol, holding losartan to allow for hyponatremia correction. 4.  Diarrhea: Resolved  Kolby Schara K. 10/03/2020, 1:33 PM

## 2020-10-04 DIAGNOSIS — E871 Hypo-osmolality and hyponatremia: Secondary | ICD-10-CM | POA: Diagnosis not present

## 2020-10-04 LAB — RENAL FUNCTION PANEL
Albumin: 3.6 g/dL (ref 3.5–5.0)
Anion gap: 6 (ref 5–15)
BUN: 12 mg/dL (ref 8–23)
CO2: 27 mmol/L (ref 22–32)
Calcium: 8.7 mg/dL — ABNORMAL LOW (ref 8.9–10.3)
Chloride: 92 mmol/L — ABNORMAL LOW (ref 98–111)
Creatinine, Ser: 0.57 mg/dL (ref 0.44–1.00)
GFR, Estimated: >60 mL/min (ref >60)
Glucose, Bld: 99 mg/dL (ref 70–99)
Phosphorus: 3.8 mg/dL (ref 2.5–4.6)
Potassium: 4.1 mmol/L (ref 3.5–5.1)
Sodium: 125 mmol/L — ABNORMAL LOW (ref 135–145)

## 2020-10-04 LAB — HEPATIC FUNCTION PANEL
ALT: 21 U/L (ref 0–44)
AST: 20 U/L (ref 15–41)
Albumin: 3.6 g/dL (ref 3.5–5.0)
Alkaline Phosphatase: 65 U/L (ref 38–126)
Bilirubin, Direct: 0.1 mg/dL (ref 0.0–0.2)
Indirect Bilirubin: 0.2 mg/dL — ABNORMAL LOW (ref 0.3–0.9)
Total Bilirubin: 0.3 mg/dL (ref 0.3–1.2)
Total Protein: 6.3 g/dL — ABNORMAL LOW (ref 6.5–8.1)

## 2020-10-04 LAB — SODIUM: Sodium: 131 mmol/L — ABNORMAL LOW (ref 135–145)

## 2020-10-04 LAB — OSMOLALITY, URINE: Osmolality, Ur: 762 mOsm/kg (ref 300–900)

## 2020-10-04 LAB — CORTISOL: Cortisol, Plasma: 7.7 ug/dL

## 2020-10-04 MED ORDER — TOLVAPTAN 15 MG PO TABS
15.0000 mg | ORAL_TABLET | Freq: Once | ORAL | Status: AC
  Administered 2020-10-04: 15 mg via ORAL
  Filled 2020-10-04: qty 1

## 2020-10-04 NOTE — Care Management Important Message (Signed)
Important Message  Patient Details IM Letter given to the Patient. Name: Patricia [name] MRN: [id number] Date of Birth: Mar 03, 1950   Medicare Important Message Given:  Yes     Kerin Salen 10/04/2020, 10:39 AM

## 2020-10-04 NOTE — Progress Notes (Signed)
PROGRESS NOTE   Patricia [name]  XBJ:[id number]    DOB: [birth year]    DOA: 09/30/2020  PCP: [name], MD   I have briefly reviewed patients previous medical [facility] in [facility]  Chief Complaint  Patient presents with   Loss of Consciousness    Brief Narrative:  70 year old married female, independent, medical history significant for SVT, PAF on Eliquis, HTN, HLD, TIA/CVAs, IBS, anxiety, GERD, chronic low back pain, former smoker, prior syncope, presented to the ED after a syncopal episode and fall at home.  Admitted for syncope, noted paroxysmal SVT versus A. fib with RVR in the 140s.  Cardiology assisting with management of SVT.  Nephrology consulted for progressively worsening hyponatremia.   Assessment & Plan:  Principal Problem:   Hyponatremia Active Problems:   Syncope   Essential hypertension   Syncope with collapse: Patient reports a milder episode of syncope in December 2021 in the context of symptomatic SVT December 2021, aborted with adenosine 6 mg by paramedics.  Sees EP cardiology/Dr. Curt Bears.  Evaluation with cardiac monitor showed SVT and 2% burden of PAF.  Compliant with low-dose beta-blockers and Eliquis.  Unclear if this was her precipitant on admission.  On telemetry has very frequent episodes of sustained narrow complex tachycardia up to 140s, SVT versus A. fib with RVR.  CT head: Negative.  Continue telemetry.  TTE 10/01/2020: LVEF 30-86%, grade 1 diastolic dysfunction, no aortic stenosis.  Carotid Dopplers: Bilateral 1-39% ICA stenosis.  Orthostatics negative.  Potassium and magnesium okay.  Cardiology follow-up appreciated and metoprolol was titrated up to 50 mg twice daily.  Infrequent and mild episodes of PSVT.  Cardiology signed off.  TSH 1.037.  Patient has been counseled that she should not drive for 6 months and she has verbalized understanding.  Overall syncope felt to be due to dehydration from GI losses.  Paroxysmal SVT/A. fib with RVR:  Management as above.  TTE: LVEF 60-65%.  Metoprolol has been titrated up to 50 mg twice daily.  Continue Eliquis for A. fib.  Cardiology signed off 9/26.  Hyponatremia/SIADH: Initially presented with serum sodium of 126 serum, felt to be volume depleted and treated with IV fluids, serum sodium peaked to 131, IV fluids were discontinued and then serum sodium gradually dropped down to 125.  Nephrology was consulted.  Fluid restriction without much effect.  Serum osmolarity 262, urine osmolarity 762 and urine sodium 133.  Euvolemic.  ARB has been discontinued.  A.m. cortisol 7.7.  Nephrology started tolvaptan.  Follow BMP closely.  Essential hypertension: Controlled on metoprolol.  Losartan held due to hyponatremia.  Hyperlipidemia: Continue statins  IBS/diarrhea: Continue Linzess-dose reduced by half due to diarrhea, MiraLAX and as needed Levsin.  Diarrhea improved.  GERD: Continue PPI  Chronic pain: Continue Soma, gabapentin and tramadol.  Anxiety: Continue home dose of clonazepam  Asthmatic bronchitis: Stable.  Continue home regimen.  Follows with outpatient pulmonology  Body mass index is 28.52 kg/m.    DVT prophylaxis:   On full dose Eliquis   Code Status: Full Code Family Communication: None at bedside today. Disposition:  Status is: Inpatient.  The patient will require care spanning > 2 midnights and should be moved to inpatient because: IV treatments appropriate due to intensity of illness or inability to take PO  Dispo: The patient is from: Home              Anticipated d/c is to: Home pending improvement and stabilization of low sodium.  Patient currently is not medically stable to d/c.   Difficult to place patient No        Consultants:   Cardiology Nephrology.  Procedures:   None  Antimicrobials:    Anti-infectives (From admission, onward)    None         Subjective:  "I know I am not going home today because my sodium is 125".  Denies  complaints.  No diarrhea reported.  No further episodes of "sinking feeling".  Has been following fluid restriction.  Objective:   Vitals:   10/04/20 0446 10/04/20 0812 10/04/20 0814 10/04/20 1301  BP: (!) 142/79   128/68  Pulse: 65   66  Resp: 20   18  Temp: 98.2 F (36.8 C)   97.7 F (36.5 C)  TempSrc: Oral   Oral  SpO2: 99% 99% 99% 96%  Weight:      Height:        General exam: Elderly female, moderately built and nourished sitting up comfortably in bed. Respiratory system: Clear to auscultation. Cardiovascular system: S1 and S2 heard, RRR.  No JVD, murmurs or pedal edema.  Telemetry with sinus rhythm.  No SVTs noted in the last 24 hours Gastrointestinal system: Abdomen is nondistended, soft and nontender. No organomegaly or masses felt. Normal bowel sounds heard. Central nervous system: Alert and oriented. No focal neurological deficits. Extremities: Symmetric 5 x 5 power. Skin: No rashes, lesions or ulcers Psychiatry: Judgement and insight appear normal. Mood & affect appropriate. Neck: No carotid bruit.    Data Reviewed:   I have personally reviewed following labs and imaging studies   CBC: Recent Labs  Lab 09/30/20 1812  WBC 6.6  HGB 14.4  HCT 40.3  MCV 96.0  PLT 379    Basic Metabolic Panel: Recent Labs  Lab 10/01/20 1319 10/02/20 0426 10/02/20 1508 10/03/20 0430 10/04/20 0515  NA  --    < > 127* 125* 125*  K  --    < > 4.5 4.7 4.1  CL  --    < > 95* 93* 92*  CO2  --    < > 28 26 27   GLUCOSE  --    < > 108* 103* 99  BUN  --    < > 11 13 12   CREATININE  --    < > 0.79 0.58 0.57  CALCIUM  --    < > 9.1 8.7* 8.7*  MG 1.6*  --   --  1.8  --   PHOS  --   --   --   --  3.8   < > = values in this interval not displayed.    Liver Function Tests: Recent Labs  Lab 10/04/20 0515 10/04/20 1423  AST  --  20  ALT  --  21  ALKPHOS  --  65  BILITOT  --  0.3  PROT  --  6.3*  ALBUMIN 3.6 3.6    CBG: No results for input(s): GLUCAP in the last 168  hours.  Microbiology Studies:   Recent Results (from the past 240 hour(s))  Resp Panel by RT-PCR (Flu A&B, Covid) Nasopharyngeal Swab     Status: None   Collection Time: 09/30/20  7:50 PM   Specimen: Nasopharyngeal Swab; Nasopharyngeal(NP) swabs in vial transport medium  Result Value Ref Range Status   SARS Coronavirus 2 by RT PCR NEGATIVE NEGATIVE Final    Comment: (NOTE) SARS-CoV-2 target nucleic acids are NOT DETECTED.  The SARS-CoV-2 RNA is  generally detectable in upper respiratory specimens during the acute phase of infection. The lowest concentration of SARS-CoV-2 viral copies this assay can detect is 138 copies/mL. A negative result does not preclude SARS-Cov-2 infection and should not be used as the sole basis for treatment or other patient management decisions. A negative result may occur with  improper specimen collection/handling, submission of specimen other than nasopharyngeal swab, presence of viral mutation(s) within the areas targeted by this assay, and inadequate number of viral copies(<138 copies/mL). A negative result must be combined with clinical observations, patient history, and epidemiological information. The expected result is Negative.  Fact Sheet for Patients:  EntrepreneurPulse.com.au  Fact Sheet for Healthcare Providers:  IncredibleEmployment.be  This test is no t yet approved or cleared by the [place] FDA and  has been authorized for detection and/or diagnosis of SARS-CoV-2 by FDA under an Emergency Use Authorization (EUA). This EUA will remain  in effect (meaning this test can be used) for the duration of the COVID-19 declaration under Section 564(b)(1) of the Act, 21 U.S.C.section 360bbb-3(b)(1), unless the authorization is terminated  or revoked sooner.       Influenza A by PCR NEGATIVE NEGATIVE Final   Influenza B by PCR NEGATIVE NEGATIVE Final    Comment: (NOTE) The Xpert Xpress  SARS-CoV-2/FLU/RSV plus assay is intended as an aid in the diagnosis of influenza from Nasopharyngeal swab specimens and should not be used as a sole basis for treatment. Nasal washings and aspirates are unacceptable for Xpert Xpress SARS-CoV-2/FLU/RSV testing.  Fact Sheet for Patients: EntrepreneurPulse.com.au  Fact Sheet for Healthcare Providers: IncredibleEmployment.be  This test is not yet approved or cleared by the [place] FDA and has been authorized for detection and/or diagnosis of SARS-CoV-2 by FDA under an Emergency Use Authorization (EUA). This EUA will remain in effect (meaning this test can be used) for the duration of the COVID-19 declaration under Section 564(b)(1) of the Act, 21 U.S.C. section 360bbb-3(b)(1), unless the authorization is terminated or revoked.  Performed at [facility], [address] [place], [place] [address]      Radiology Studies:  No results found.   Scheduled Meds:    apixaban  5 mg Oral BID   carisoprodol  350 mg Oral TID   cholecalciferol  2,000 Units Oral Daily   clonazePAM  1 mg Oral QHS   gabapentin  600 mg Oral BID WC   linaclotide  145 mcg Oral q1600   metoprolol tartrate  50 mg Oral BID   mometasone-formoterol  2 puff Inhalation BID   pantoprazole  40 mg Oral Daily   rosuvastatin  5 mg Oral Q M,W,F    Continuous Infusions:       LOS: 3 days     Vernell Leep, MD, [place], [facility]. [facility]    To contact the attending provider between 7A-7P or the covering provider during after hours 7P-7A, please log into the web site [email or web address] and access using universal [place] password for that web site. If you do not have the password, please call the hospital operator.  10/04/2020, 5:27 PM

## 2020-10-04 NOTE — Plan of Care (Signed)
  Problem: Clinical Measurements: Goal: Diagnostic test results will improve Outcome: Progressing   Problem: Coping: Goal: Level of anxiety will decrease Outcome: Progressing   Problem: Nutrition: Goal: Adequate nutrition will be maintained Outcome: Adequate for Discharge

## 2020-10-04 NOTE — Progress Notes (Signed)
Patient ID: Patricia [name], female   DOB: [birth year], 70 y.o.   MRN: [id number] [facility] Progress Note   Assessment/ Plan:   1.  Hyponatremia: True hyponatremia with euvolemia and an inappropriately elevated ADH (high urine osmolality).  Poor response to limiting free water after earlier administration of isotonic fluids. Will order tolvaptan to correct sodium level.  2.  Syncopal event: With SVT noted since admission and suspected to be largely from volume contraction secondary to GI losses.  Beta-blockers adjusted for management of paroxysmal SVT. 3.  Hypertension: Blood pressure currently well controlled with titration of metoprolol, holding losartan to allow for hyponatremia correction. 4.  Diarrhea: Resolved and no complaining of constipation.   Subjective:   Complains of constipation and dry mouth from fluid restriction.    Objective:   BP 128/68 (BP Location: Left Arm)   Pulse 66   Temp 97.7 F (36.5 C) (Oral)   Resp 18   Ht 5\' 7"  (1.702 m)   Wt 82.6 kg   LMP  (LMP Unknown)   SpO2 96%   BMI 28.52 kg/m   Intake/Output Summary (Last 24 hours) at 10/04/2020 1405 Last data filed at 10/04/2020 1225 Gross per 24 hour  Intake 470 ml  Output 2600 ml  Net -2130 ml   Weight change:   Physical Exam: TJQ:[id number] resting in bed AUQ:JFHLK regular rhythm and normal rate, normal s1 and s2 Resp:clear to auscultation bilaterally, no rales/rhonchi TGY:BWLS, obese, non-tender Ext:No LE edema  Imaging: No results found.  Labs: BMET Recent Labs  Lab 09/30/20 1812 10/01/20 0438 10/02/20 0426 10/02/20 1508 10/03/20 0430 10/04/20 0515  NA 126* 131* 128* 127* 125* 125*  K 4.3 4.0 3.6 4.5 4.7 4.1  CL 91* 98 95* 95* 93* 92*  CO2 27 26 24 28 26 27   GLUCOSE 84 90 113* 108* 103* 99  BUN 13 10 10 11 13 12   CREATININE 0.68 0.51 0.54 0.79 0.58 0.57  CALCIUM 9.5 9.0 8.7* 9.1 8.7* 8.7*  PHOS  --   --   --   --   --  3.8   CBC Recent Labs  Lab 09/30/20 1812   WBC 6.6  HGB 14.4  HCT 40.3  MCV 96.0  PLT 287    Medications:     apixaban  5 mg Oral BID   carisoprodol  350 mg Oral TID   cholecalciferol  2,000 Units Oral Daily   clonazePAM  1 mg Oral QHS   gabapentin  600 mg Oral BID WC   linaclotide  145 mcg Oral q1600   metoprolol tartrate  50 mg Oral BID   mometasone-formoterol  2 puff Inhalation BID   pantoprazole  40 mg Oral Daily   rosuvastatin  5 mg Oral Q M,W,F   tolvaptan  15 mg Oral Once   Elmarie Shiley, MD 10/04/2020, 2:05 PM

## 2020-10-05 LAB — RENAL FUNCTION PANEL
Albumin: 3.8 g/dL (ref 3.5–5.0)
Anion gap: 4 — ABNORMAL LOW (ref 5–15)
BUN: 12 mg/dL (ref 8–23)
CO2: 29 mmol/L (ref 22–32)
Calcium: 9.3 mg/dL (ref 8.9–10.3)
Chloride: 102 mmol/L (ref 98–111)
Creatinine, Ser: 0.73 mg/dL (ref 0.44–1.00)
GFR, Estimated: >60 mL/min (ref >60)
Glucose, Bld: 98 mg/dL (ref 70–99)
Phosphorus: 4.6 mg/dL (ref 2.5–4.6)
Potassium: 4.3 mmol/L (ref 3.5–5.1)
Sodium: 135 mmol/L (ref 135–145)

## 2020-10-05 LAB — SODIUM: Sodium: 137 mmol/L (ref 135–145)

## 2020-10-05 NOTE — Progress Notes (Signed)
Patient ID: Patricia [name], female   DOB: 01/19/1950, 70 y.o.   MRN: [id number] [facility] Progress Note   Assessment/ Plan:   1.  Hyponatremia: True hyponatremia with euvolemia and an inappropriately elevated ADH (high urine osmolality).  Poor response to limiting free water after earlier administration of isotonic fluids. Sodium level corrected with single dose tolvaptan. Stable from renal standpoint to DC home to f/u labs with PCP.   2.  Syncopal event: With SVT noted since admission and suspected to be largely from volume contraction secondary to GI losses.  Beta-blockers adjusted for management of paroxysmal SVT. 3.  Hypertension: Blood pressure currently well controlled with titration of metoprolol, restart her losartan now that we have hyponatremia correction. 4.  Diarrhea: Resolved.   Subjective:   Reports to be feeling better, denies chest pain or shortness of breath.     Objective:   BP (!) 107/57 (BP Location: Left Arm)   Pulse 70   Temp 97.9 F (36.6 C) (Oral)   Resp 19   Ht 5\' 7"  (1.702 m)   Wt 82.6 kg   LMP  (LMP Unknown)   SpO2 96%   BMI 28.52 kg/m   Intake/Output Summary (Last 24 hours) at 10/05/2020 1320 Last data filed at 10/05/2020 0857 Gross per 24 hour  Intake 480 ml  Output 1900 ml  Net -1420 ml   Weight change:   Physical Exam: [id number] resting in bed, husband at her side TDS:KAJGO regular rhythm and normal rate, normal s1 and s2 Resp:clear to auscultation bilaterally, no rales/rhonchi TLX:BWIO, obese, non-tender Ext:No LE edema  Imaging: No results found.  Labs: BMET Recent Labs  Lab 09/30/20 1812 10/01/20 0355 10/02/20 0426 10/02/20 1508 10/03/20 0430 10/04/20 0515 10/04/20 2128 10/05/20 0638  NA 126* 131* 128* 127* 125* 125* 131* 135  137  K 4.3 4.0 3.6 4.5 4.7 4.1  --  4.3  CL 91* 98 95* 95* 93* 92*  --  102  CO2 27 26 24 28 26 27   --  29  GLUCOSE 84 90 113* 108* 103* 99  --  98  BUN 13 10 10 11 13 12   --   12  CREATININE 0.68 0.51 0.54 0.79 0.58 0.57  --  0.73  CALCIUM 9.5 9.0 8.7* 9.1 8.7* 8.7*  --  9.3  PHOS  --   --   --   --   --  3.8  --  4.6   CBC Recent Labs  Lab 09/30/20 1812  WBC 6.6  HGB 14.4  HCT 40.3  MCV 96.0  PLT 287    Medications:     apixaban  5 mg Oral BID   carisoprodol  350 mg Oral TID   cholecalciferol  2,000 Units Oral Daily   clonazePAM  1 mg Oral QHS   gabapentin  600 mg Oral BID WC   linaclotide  145 mcg Oral q1600   metoprolol tartrate  50 mg Oral BID   mometasone-formoterol  2 puff Inhalation BID   pantoprazole  40 mg Oral Daily   rosuvastatin  5 mg Oral Q M,W,F   Elmarie Shiley, MD 10/05/2020, 1:20 PM

## 2020-10-05 NOTE — Discharge Summary (Signed)
Physician Discharge Summary  Patricia [name] HDQ:[id number] DOB: 1950/06/14 DOA: 09/30/2020  PCP: [name], MD  Admit date: 09/30/2020 Discharge date: 10/05/2020  Admitted From: Home Disposition: Home  Recommendations for Outpatient Follow-up:  Follow up with PCP in 1 week Please obtain BMP/CBC in one week Please follow up on the following pending results: None  Home Health: None Equipment/Devices: None  Discharge Condition: Stable CODE STATUS: Full code Diet recommendation: Heart healthy   Brief/Interim Summary:  Admission HPI written by Harrold Donath, MD   HPI: Patricia [name] is a 70 y.o. female with medical history significant for HTN, HLD, hx of TIA/CVAs, IBS, anxiety who presents after a syncopal episode at home.  She states she was out doing errands all day and did not drink or eat very much.  After she got home she went to plug in a air freshener into the bathroom and while she was opening the package she has began to feel "off" and the next thing she knows she was laying on the floor.  Her husband was in the kitchen putting the groceries when he heard her fall and came to check on her.  There was no mention of any seizure type activity.  When asked her what she was doing the floor she opened her eyes and said just resting.  She denies any headache at this time.  States she has never had syncope in the past.  She has no numbness or weakness in her extremities and states that she feels at her normal baseline level.    Hospital course:  Syncope with collapse Patient followed with EP as an outpatient. Patient is on Eliquis/metoprolol for known history of SVT and atrial fibrillation. Concern syncope was secondary to cardiac arrhythmia versus dehydration from GI fluid losses. CT head negative. Transthoracic Echocardiogram significant for an EF of 60-65% and grade 1 diastolic dysfunction, without aortic stenosis. Carotid dopplers with 1-39% ICA stenosis.  Cardiology was consulted for management with recommendations below.   Paroxysmal SVT Atrial fibrillation with RVR Cardiology consulted. Transthoracic Echocardiogram with an EF of 60-65%. Metoprolol titrated up to 50 mg BID. Continued Eliquis for known atrial fibrillation.  Hyponatremia SIADH Initial serum sodium of 126 with concern for volume depletion. Initial treatment with IV fluids with initial improvement before drop down to 125. Nephrology was consulted and initiated fluid restriction with stable sodium. Tolvaptan given with significant improvement. Outpatient BMP.  Primary hypertension Continue Losartan and metoprolol.  Hyperlipidemia Continue home Crestor  IBS Diarrhea Linzess decreased to 145 mcg on discharge secondary to diarrhea. Continue Miralax and Levsin.  GERD Continue Prilosec  Chronic pain Continue Soma, gabapentin, Tramadol  Anxiety Continue Klonopin  Asthmatic bronchitis Stable.   Discharge Diagnoses:  Principal Problem:   Hyponatremia Active Problems:   Syncope   Essential hypertension    Discharge Instructions   Allergies as of 10/05/2020       Reactions   Augmentin [amoxicillin-pot Clavulanate] Nausea Only, Other (See Comments)   GI upset   Levaquin [levofloxacin In D5w] Other (See Comments)   Joint problems   Mom [magnesium Hydroxide] Other (See Comments)   Welts   Oxycodone Other (See Comments)   Nausea        Medication List     STOP taking these medications    apixaban 5 MG Tabs tablet Commonly known as: ELIQUIS   cefdinir 300 MG capsule Commonly known as: OMNICEF   clotrimazole 10 MG troche Commonly known as: MYCELEX   fluconazole 100  MG tablet Commonly known as: Diflucan       TAKE these medications    acetaminophen 325 MG tablet Commonly known as: TYLENOL 1 tablet as needed.   budesonide-formoterol 80-4.5 MCG/ACT inhaler Commonly known as: Symbicort Take 2 puffs first thing in am and then another 2  puffs about 12 hours later.   carboxymethylcellulose 0.5 % Soln Commonly known as: REFRESH PLUS Place 1 drop into both eyes 3 (three) times daily as needed (for dryness).   carisoprodol 350 MG tablet Commonly known as: SOMA Take 350 mg by mouth 3 (three) times daily.   clobetasol ointment 0.05 % Commonly known as: TEMOVATE Apply 1 application topically See admin instructions. Apply to vaginal area daily as directed   clonazePAM 1 MG tablet Commonly known as: KLONOPIN Take 1 mg by mouth at bedtime.   fluticasone 50 MCG/ACT nasal spray Commonly known as: FLONASE Place 2 sprays into both nostrils daily as needed for allergies or rhinitis.   gabapentin 600 MG tablet Commonly known as: NEURONTIN Take 600 mg by mouth See admin instructions. Take 600 mg by mouth in the morning and at lunchtime   hydrOXYzine 10 MG tablet Commonly known as: ATARAX/VISTARIL Take 10 mg by mouth at bedtime as needed (for sleep).   hyoscyamine 0.125 MG tablet Commonly known as: LEVSIN Take 0.125 mg by mouth as needed.   linaclotide 145 MCG Caps capsule Commonly known as: LINZESS Take 1 capsule (145 mcg total) by mouth daily at 4 PM. What changed:  medication strength how much to take   losartan 100 MG tablet Commonly known as: COZAAR Take 100 mg by mouth daily.   metoprolol tartrate 50 MG tablet Commonly known as: LOPRESSOR Take 1 tablet (50 mg total) by mouth 2 (two) times daily. What changed:  medication strength how much to take   omeprazole 20 MG capsule Commonly known as: PRILOSEC Take 20 mg by mouth daily before breakfast.   ondansetron 4 MG tablet Commonly known as: Zofran Take 1 tablet (4 mg total) by mouth every 8 (eight) hours as needed for nausea or vomiting.   polyethylene glycol 17 g packet Commonly known as: MIRALAX / GLYCOLAX Take 34 g by mouth in the morning.   REMIFEMIN PO Take 1 tablet by mouth in the morning and at bedtime.   rosuvastatin 5 MG tablet Commonly  known as: CRESTOR Take 5 mg by mouth 3 (three) times a week. Mondays Wednesdays Fridays   traMADol 50 MG tablet Commonly known as: ULTRAM Take 100 mg by mouth as needed.   Ventolin HFA 108 (90 Base) MCG/ACT inhaler Generic drug: albuterol Inhale 2 puffs into the lungs every 6 (six) hours as needed for wheezing or shortness of breath.   Vitamin D3 50 MCG (2000 UT) Tabs Take 2,000 Units by mouth daily.   zaleplon 10 MG capsule Commonly known as: SONATA Take 10 mg by mouth at bedtime as needed (for interrupted sleep).        Follow-up Information     Constance Haw, MD Follow up.   Specialty: Cardiology Why: his office will call you Monday, if you have not heard from them by wed this week call his office. Contact information: [address] [place] [address] [phone]         [name], MD. Schedule an appointment as soon as possible for a visit in 3 day(s).   Specialty: Family Medicine Why: Repeat metabolic panel within 3-5 days. Hospital follow-up visit within 1  week Contact information: [place] [address] [phone]                Allergies  Allergen Reactions   Augmentin [Amoxicillin-Pot Clavulanate] Nausea Only and Other (See Comments)    GI upset   Levaquin [Levofloxacin In D5w] Other (See Comments)    Joint problems    Mom [Magnesium Hydroxide] Other (See Comments)    Welts    Oxycodone Other (See Comments)    Nausea     Consultations: Cardiology   Procedures/Studies: CT Head Wo Contrast  Result Date: 09/30/2020 CLINICAL DATA:  Head trauma after loss of consciousness. EXAM: CT HEAD WITHOUT CONTRAST TECHNIQUE: Contiguous axial images were obtained from the base of the skull through the vertex without intravenous contrast. COMPARISON:  November 07, 2008. FINDINGS: Brain: No evidence of acute infarction, hemorrhage, hydrocephalus, extra-axial collection or mass lesion/mass effect.  Vascular: No hyperdense vessel or unexpected calcification. Skull: Normal. Negative for fracture or focal lesion. Sinuses/Orbits: No acute finding. Other: None. IMPRESSION: No acute intracranial abnormality seen. Electronically Signed   By: Marijo Conception M.D.   On: 09/30/2020 20:27   DG Chest Port 1 View  Result Date: 09/30/2020 CLINICAL DATA:  Cough.  Patient reports syncope today. EXAM: PORTABLE CHEST 1 VIEW COMPARISON:  Chest radiograph 06/29/2020 FINDINGS: The cardiomediastinal contours are normal. Atherosclerosis of the aortic arch. Borderline hyperinflation which is similar to prior exam. Pulmonary vasculature is normal. No consolidation, pleural effusion, or pneumothorax. No acute osseous abnormalities are seen. IMPRESSION: No acute chest findings.  Stable borderline hyperinflation. Electronically Signed   By: Keith Rake M.D.   On: 09/30/2020 20:19   ECHOCARDIOGRAM COMPLETE  Result Date: 10/01/2020    ECHOCARDIOGRAM REPORT   Patient Name:   Patricia [name] Date of Exam: 10/01/2020 Medical Rec #:  [id number]       Height:       67.0 in Accession #:    [id number]      Weight:       182.1 lb Date of Birth:  1950-01-13        BSA:          1.943 m Patient Age:    79 years        BP:           150/82 mmHg Patient Gender: F               HR:           90 bpm. Exam Location:  Inpatient Procedure: 2D Echo, Color Doppler and Cardiac Doppler Indications:    R55 Syncope  History:        Patient has prior history of Echocardiogram examinations, most                 recent 10/25/2017. Risk Factors:Hypertension and Dyslipidemia.  Sonographer:    Raquel Sarna Senior RDCS Referring Phys: [place]  1. Left ventricular ejection fraction, by estimation, is 60 to 65%. The left ventricle has normal function. The left ventricle has no regional wall motion abnormalities. Left ventricular diastolic parameters are consistent with Grade I diastolic dysfunction (impaired relaxation).  2. Right ventricular  systolic function is normal. The right ventricular size is normal.  3. Prominent epicardial fat.  4. The mitral valve is normal in structure. No evidence of mitral valve regurgitation. No evidence of mitral stenosis.  5. The aortic valve was not well visualized. There is mild calcification of the  aortic valve. Aortic valve regurgitation is not visualized. Mild aortic valve sclerosis is present, with no evidence of aortic valve stenosis.  6. The inferior vena cava is normal in size with <50% respiratory variability, suggesting right atrial pressure of 8 mmHg. FINDINGS  Left Ventricle: Left ventricular ejection fraction, by estimation, is 60 to 65%. The left ventricle has normal function. The left ventricle has no regional wall motion abnormalities. The left ventricular internal cavity size was normal in size. There is  no left ventricular hypertrophy. Left ventricular diastolic parameters are consistent with Grade I diastolic dysfunction (impaired relaxation). Right Ventricle: The right ventricular size is normal. No increase in right ventricular wall thickness. Right ventricular systolic function is normal. Left Atrium: Left atrial size was normal in size. Right Atrium: Right atrial size was normal in size. Pericardium: Prominent epicardial fat. There is no evidence of pericardial effusion. Mitral Valve: The mitral valve is normal in structure. No evidence of mitral valve regurgitation. No evidence of mitral valve stenosis. Tricuspid Valve: The tricuspid valve is normal in structure. Tricuspid valve regurgitation is not demonstrated. No evidence of tricuspid stenosis. Aortic Valve: The aortic valve was not well visualized. There is mild calcification of the aortic valve. Aortic valve regurgitation is not visualized. Mild aortic valve sclerosis is present, with no evidence of aortic valve stenosis. Pulmonic Valve: The pulmonic valve was normal in structure. Pulmonic valve regurgitation is not visualized. No evidence  of pulmonic stenosis. Aorta: The aortic root is normal in size and structure. Venous: The inferior vena cava is normal in size with less than 50% respiratory variability, suggesting right atrial pressure of 8 mmHg. IAS/Shunts: No atrial level shunt detected by color flow Doppler.  LEFT VENTRICLE PLAX 2D LVIDd:         3.90 cm  Diastology LVIDs:         2.80 cm  LV e' medial:    7.40 cm/s LV PW:         1.00 cm  LV E/e' medial:  8.6 LV IVS:        0.80 cm  LV e' lateral:   6.31 cm/s LVOT diam:     2.00 cm  LV E/e' lateral: 10.0 LV SV:         57 LV SV Index:   29 LVOT Area:     3.14 cm  RIGHT VENTRICLE RV S prime:     11.70 cm/s TAPSE (M-mode): 2.6 cm LEFT ATRIUM             Index       RIGHT ATRIUM           Index LA diam:        2.20 cm 1.13 cm/m  RA Area:     11.30 cm LA Vol (A2C):   45.4 ml 23.36 ml/m RA Volume:   23.30 ml  11.99 ml/m LA Vol (A4C):   23.1 ml 11.89 ml/m LA Biplane Vol: 32.6 ml 16.78 ml/m  AORTIC VALVE LVOT Vmax:   91.40 cm/s LVOT Vmean:  62.800 cm/s LVOT VTI:    0.181 m  AORTA Ao Root diam: 3.60 cm Ao Asc diam:  3.10 cm MITRAL VALVE MV Area (PHT): 4.33 cm    SHUNTS MV Decel Time: 175 msec    Systemic VTI:  0.18 m MV E velocity: 63.40 cm/s  Systemic Diam: 2.00 cm MV A velocity: 83.60 cm/s MV E/A ratio:  0.76 Jenkins Rouge MD Electronically signed by Jenkins Rouge MD Signature Date/Time: 10/01/2020/10:44:38  AM    Final    VAS US CAROTID  Result Date: 10/01/2020 Carotid Arterial Duplex Study Patient Name:  Patricia [name]  Date of Exam:   10/01/2020 Medical Rec #: [id number]           Accession #:    [id number] Date of Birth: March 27, 1950            Patient Gender: F Patient Age:   51 years Exam Location:  [facility] Procedure:      VAS US CAROTID Referring Phys: ANAND HONGALGI --------------------------------------------------------------------------------  Indications:      Syncope. Risk Factors:     Hypertension, hyperlipidemia, past history of smoking, prior                   CVA.  Comparison Study: No prior studies. Performing Technologist: Darlin Coco RDMS, RVT  Examination Guidelines: A complete evaluation includes B-mode imaging, spectral Doppler, color Doppler, and power Doppler as needed of all accessible portions of each vessel. Bilateral testing is considered an integral part of a complete examination. Limited examinations for reoccurring indications may be performed as noted.  Right Carotid Findings: +----------+--------+--------+--------+-------------------+--------+           PSV cm/sEDV cm/sStenosisPlaque Description Comments +----------+--------+--------+--------+-------------------+--------+ CCA Prox  56      17                                          +----------+--------+--------+--------+-------------------+--------+ CCA Distal52      19                                          +----------+--------+--------+--------+-------------------+--------+ ICA Prox  52      26      1-39%   calcific and smooth         +----------+--------+--------+--------+-------------------+--------+ ICA Distal87      30                                          +----------+--------+--------+--------+-------------------+--------+ ECA       45      9                                           +----------+--------+--------+--------+-------------------+--------+ +----------+--------+-------+----------------+-------------------+           PSV cm/sEDV cmsDescribe        Arm Pressure (mmHG) +----------+--------+-------+----------------+-------------------+ [id number]             Multiphasic, WNL                    +----------+--------+-------+----------------+-------------------+ +---------+--------+--+--------+--+---------+ VertebralPSV cm/s57EDV cm/s18Antegrade +---------+--------+--+--------+--+---------+  Left Carotid Findings: +----------+--------+--------+--------+----------------------+--------+           PSV cm/sEDV cm/sStenosisPlaque  Description    Comments +----------+--------+--------+--------+----------------------+--------+ CCA Prox  76      18                                             +----------+--------+--------+--------+----------------------+--------+ CCA  Distal66      18                                             +----------+--------+--------+--------+----------------------+--------+ ICA Prox  37      13      1-39%   focal and heterogenous         +----------+--------+--------+--------+----------------------+--------+ ICA Distal61      23                                             +----------+--------+--------+--------+----------------------+--------+ ECA       58                                                     +----------+--------+--------+--------+----------------------+--------+ +----------+--------+--------+----------------+-------------------+           PSV cm/sEDV cm/sDescribe        Arm Pressure (mmHG) +----------+--------+--------+----------------+-------------------+ [id number]              Multiphasic, WNL                    +----------+--------+--------+----------------+-------------------+ +---------+--------+--+--------+-+---------+ VertebralPSV cm/s39EDV cm/s9Antegrade +---------+--------+--+--------+-+---------+   Summary: Right Carotid: Velocities in the right ICA are consistent with a 1-39% stenosis. Left Carotid: Velocities in the left ICA are consistent with a 1-39% stenosis. Vertebrals:  Bilateral vertebral arteries demonstrate antegrade flow. Subclavians: Normal flow hemodynamics were seen in bilateral subclavian              arteries. *See table(s) above for measurements and observations.  Electronically signed by Servando Snare MD on 10/01/2020 at 5:26:53 PM.    Final       Subjective: No issues  Discharge Exam: Vitals:   10/05/20 0810 10/05/20 1312  BP:  (!) 107/57  Pulse:  70  Resp:  19  Temp:  97.9 F (36.6 C)  SpO2: 96% 96%    Vitals:   10/05/20 0454 10/05/20 0808 10/05/20 0810 10/05/20 1312  BP: 129/76   (!) 107/57  Pulse: 73   70  Resp:    19  Temp: 97.9 F (36.6 C)   97.9 F (36.6 C)  TempSrc:    Oral  SpO2: 96% 96% 96% 96%  Weight:      Height:        General: Pt is alert, awake, not in acute distress    The results of significant diagnostics from this hospitalization (including imaging, microbiology, ancillary and laboratory) are listed below for reference.     Microbiology: Recent Results (from the past 240 hour(s))  Resp Panel by RT-PCR (Flu A&B, Covid) Nasopharyngeal Swab     Status: None   Collection Time: 09/30/20  7:50 PM   Specimen: Nasopharyngeal Swab; Nasopharyngeal(NP) swabs in vial transport medium  Result Value Ref Range Status   SARS Coronavirus 2 by RT PCR NEGATIVE NEGATIVE Final    Comment: (NOTE) SARS-CoV-2 target nucleic acids are NOT DETECTED.  The SARS-CoV-2 RNA is generally detectable in upper respiratory specimens during the acute phase of infection. The lowest concentration of SARS-CoV-2 viral copies this assay can detect is 138 copies/mL. A negative  result does not preclude SARS-Cov-2 infection and should not be used as the sole basis for treatment or other patient management decisions. A negative result may occur with  improper specimen collection/handling, submission of specimen other than nasopharyngeal swab, presence of viral mutation(s) within the areas targeted by this assay, and inadequate number of viral copies(<138 copies/mL). A negative result must be combined with clinical observations, patient history, and epidemiological information. The expected result is Negative.  Fact Sheet for Patients:  EntrepreneurPulse.com.au  Fact Sheet for Healthcare Providers:  IncredibleEmployment.be  This test is no t yet approved or cleared by the [place] FDA and  has been authorized for detection and/or diagnosis of  SARS-CoV-2 by FDA under an Emergency Use Authorization (EUA). This EUA will remain  in effect (meaning this test can be used) for the duration of the COVID-19 declaration under Section 564(b)(1) of the Act, 21 U.S.C.section 360bbb-3(b)(1), unless the authorization is terminated  or revoked sooner.       Influenza A by PCR NEGATIVE NEGATIVE Final   Influenza B by PCR NEGATIVE NEGATIVE Final    Comment: (NOTE) The Xpert Xpress SARS-CoV-2/FLU/RSV plus assay is intended as an aid in the diagnosis of influenza from Nasopharyngeal swab specimens and should not be used as a sole basis for treatment. Nasal washings and aspirates are unacceptable for Xpert Xpress SARS-CoV-2/FLU/RSV testing.  Fact Sheet for Patients: EntrepreneurPulse.com.au  Fact Sheet for Healthcare Providers: IncredibleEmployment.be  This test is not yet approved or cleared by the [place] FDA and has been authorized for detection and/or diagnosis of SARS-CoV-2 by FDA under an Emergency Use Authorization (EUA). This EUA will remain in effect (meaning this test can be used) for the duration of the COVID-19 declaration under Section 564(b)(1) of the Act, 21 U.S.C. section 360bbb-3(b)(1), unless the authorization is terminated or revoked.  Performed at [facility], [address] [place], [place] [address]      Labs: BNP (last 3 results) No results for input(s): BNP in the last 8760 hours. Basic Metabolic Panel: Recent Labs  Lab 10/01/20 1319 10/02/20 0426 10/02/20 1508 10/03/20 0430 10/04/20 0515 10/04/20 2128 10/05/20 0638  NA  --  128* 127* 125* 125* 131* 135  137  K  --  3.6 4.5 4.7 4.1  --  4.3  CL  --  95* 95* 93* 92*  --  102  CO2  --  24 28 26 27   --  29  GLUCOSE  --  113* 108* 103* 99  --  98  BUN  --  10 11 13 12   --  12  CREATININE  --  0.54 0.79 0.58 0.57  --  0.73  CALCIUM  --  8.7* 9.1 8.7* 8.7*  --  9.3  MG 1.6*  --   --  1.8   --   --   --   PHOS  --   --   --   --  3.8  --  4.6   Liver Function Tests: Recent Labs  Lab 10/04/20 0515 10/04/20 1423 10/05/20 0638  AST  --  20  --   ALT  --  21  --   ALKPHOS  --  65  --   BILITOT  --  0.3  --   PROT  --  6.3*  --   ALBUMIN 3.6 3.6 3.8   No results for input(s): LIPASE, AMYLASE in the last 168 hours. No results for input(s): AMMONIA in the last 168 hours. CBC:  Recent Labs  Lab 09/30/20 1812  WBC 6.6  HGB 14.4  HCT 40.3  MCV 96.0  PLT 287   Cardiac Enzymes: No results for input(s): CKTOTAL, CKMB, CKMBINDEX, TROPONINI in the last 168 hours. BNP: Invalid input(s): POCBNP CBG: No results for input(s): GLUCAP in the last 168 hours. D-Dimer No results for input(s): DDIMER in the last 72 hours. Hgb A1c No results for input(s): HGBA1C in the last 72 hours. Lipid Profile No results for input(s): CHOL, HDL, LDLCALC, TRIG, CHOLHDL, LDLDIRECT in the last 72 hours. Thyroid function studies No results for input(s): TSH, T4TOTAL, T3FREE, THYROIDAB in the last 72 hours.  Invalid input(s): FREET3 Anemia work up No results for input(s): VITAMINB12, FOLATE, FERRITIN, TIBC, IRON, RETICCTPCT in the last 72 hours. Urinalysis    Component Value Date/Time   COLORURINE YELLOW 10/03/2020 1813   APPEARANCEUR CLEAR 10/03/2020 1813   LABSPEC >1.030 (H) 10/03/2020 1813   PHURINE 6.0 10/03/2020 1813   GLUCOSEU NEGATIVE 10/03/2020 1813   HGBUR NEGATIVE 10/03/2020 1813   BILIRUBINUR NEGATIVE 10/03/2020 1813   KETONESUR NEGATIVE 10/03/2020 1813   PROTEINUR NEGATIVE 10/03/2020 1813   NITRITE NEGATIVE 10/03/2020 1813   LEUKOCYTESUR NEGATIVE 10/03/2020 1813   Sepsis Labs Invalid input(s): PROCALCITONIN,  WBC,  LACTICIDVEN Microbiology Recent Results (from the past 240 hour(s))  Resp Panel by RT-PCR (Flu A&B, Covid) Nasopharyngeal Swab     Status: None   Collection Time: 09/30/20  7:50 PM   Specimen: Nasopharyngeal Swab; Nasopharyngeal(NP) swabs in vial transport  medium  Result Value Ref Range Status   SARS Coronavirus 2 by RT PCR NEGATIVE NEGATIVE Final    Comment: (NOTE) SARS-CoV-2 target nucleic acids are NOT DETECTED.  The SARS-CoV-2 RNA is generally detectable in upper respiratory specimens during the acute phase of infection. The lowest concentration of SARS-CoV-2 viral copies this assay can detect is 138 copies/mL. A negative result does not preclude SARS-Cov-2 infection and should not be used as the sole basis for treatment or other patient management decisions. A negative result may occur with  improper specimen collection/handling, submission of specimen other than nasopharyngeal swab, presence of viral mutation(s) within the areas targeted by this assay, and inadequate number of viral copies(<138 copies/mL). A negative result must be combined with clinical observations, patient history, and epidemiological information. The expected result is Negative.  Fact Sheet for Patients:  EntrepreneurPulse.com.au  Fact Sheet for Healthcare Providers:  IncredibleEmployment.be  This test is no t yet approved or cleared by the [place] FDA and  has been authorized for detection and/or diagnosis of SARS-CoV-2 by FDA under an Emergency Use Authorization (EUA). This EUA will remain  in effect (meaning this test can be used) for the duration of the COVID-19 declaration under Section 564(b)(1) of the Act, 21 U.S.C.section 360bbb-3(b)(1), unless the authorization is terminated  or revoked sooner.       Influenza A by PCR NEGATIVE NEGATIVE Final   Influenza B by PCR NEGATIVE NEGATIVE Final    Comment: (NOTE) The Xpert Xpress SARS-CoV-2/FLU/RSV plus assay is intended as an aid in the diagnosis of influenza from Nasopharyngeal swab specimens and should not be used as a sole basis for treatment. Nasal washings and aspirates are unacceptable for Xpert Xpress SARS-CoV-2/FLU/RSV testing.  Fact Sheet for  Patients: EntrepreneurPulse.com.au  Fact Sheet for Healthcare Providers: IncredibleEmployment.be  This test is not yet approved or cleared by the [place] FDA and has been authorized for detection and/or diagnosis of SARS-CoV-2 by FDA under an Emergency Use Authorization (EUA).  This EUA will remain in effect (meaning this test can be used) for the duration of the COVID-19 declaration under Section 564(b)(1) of the Act, 21 U.S.C. section 360bbb-3(b)(1), unless the authorization is terminated or revoked.  Performed at [facility], [address], [place], [place] [address]      Time coordinating discharge: 35 minutes  SIGNED:   Cordelia Poche, MD [facility] 10/05/2020, 1:47 PM

## 2020-10-05 NOTE — Discharge Instructions (Addendum)
[address] [place],  You were in the hospital after passing out. This may have been related to dehydration but you also were found to have a very fast heart rate. While you were here, you showed evidence of low sodium (hyponatremia). The kidney doctor was consulted and you were given medication to improve your sodium levels. Please follow-up with your primary care and cardiology physicians. You will need to have your sodium rechecked in 3-5 days

## 2020-10-05 NOTE — Plan of Care (Signed)
  Problem: Clinical Measurements: Goal: Diagnostic test results will improve Outcome: Progressing   Problem: Coping: Goal: Level of anxiety will decrease Outcome: Progressing   Problem: Pain Managment: Goal: General experience of comfort will improve Outcome: Progressing   Problem: Safety: Goal: Ability to remain free from injury will improve Outcome: Progressing

## 2020-10-08 DIAGNOSIS — C3490 Malignant neoplasm of unspecified part of unspecified bronchus or lung: Secondary | ICD-10-CM

## 2020-10-08 HISTORY — DX: Malignant neoplasm of unspecified part of unspecified bronchus or lung: C34.90

## 2020-10-10 DIAGNOSIS — Z23 Encounter for immunization: Secondary | ICD-10-CM | POA: Diagnosis not present

## 2020-10-10 DIAGNOSIS — E871 Hypo-osmolality and hyponatremia: Secondary | ICD-10-CM | POA: Diagnosis not present

## 2020-10-10 DIAGNOSIS — M542 Cervicalgia: Secondary | ICD-10-CM | POA: Diagnosis not present

## 2020-10-10 DIAGNOSIS — I10 Essential (primary) hypertension: Secondary | ICD-10-CM | POA: Diagnosis not present

## 2020-10-10 DIAGNOSIS — I480 Paroxysmal atrial fibrillation: Secondary | ICD-10-CM | POA: Diagnosis not present

## 2020-10-13 ENCOUNTER — Inpatient Hospital Stay (HOSPITAL_COMMUNITY)
Admission: EM | Admit: 2020-10-13 | Discharge: 2020-10-19 | DRG: 644 | Disposition: A | Discharge status: Home / Self Care | Payer: Medicare HMO | Attending: Internal Medicine | Admitting: Internal Medicine

## 2020-10-13 ENCOUNTER — Other Ambulatory Visit: Payer: Self-pay

## 2020-10-13 ENCOUNTER — Encounter (HOSPITAL_COMMUNITY): Payer: Self-pay | Admitting: Emergency Medicine

## 2020-10-13 DIAGNOSIS — K5900 Constipation, unspecified: Secondary | ICD-10-CM | POA: Diagnosis present

## 2020-10-13 DIAGNOSIS — K219 Gastro-esophageal reflux disease without esophagitis: Secondary | ICD-10-CM | POA: Diagnosis present

## 2020-10-13 DIAGNOSIS — E785 Hyperlipidemia, unspecified: Secondary | ICD-10-CM | POA: Diagnosis present

## 2020-10-13 DIAGNOSIS — I471 Supraventricular tachycardia: Secondary | ICD-10-CM | POA: Diagnosis not present

## 2020-10-13 DIAGNOSIS — Z880 Allergy status to penicillin: Secondary | ICD-10-CM

## 2020-10-13 DIAGNOSIS — E871 Hypo-osmolality and hyponatremia: Secondary | ICD-10-CM | POA: Diagnosis present

## 2020-10-13 DIAGNOSIS — R911 Solitary pulmonary nodule: Secondary | ICD-10-CM

## 2020-10-13 DIAGNOSIS — G8929 Other chronic pain: Secondary | ICD-10-CM | POA: Diagnosis present

## 2020-10-13 DIAGNOSIS — C3432 Malignant neoplasm of lower lobe, left bronchus or lung: Secondary | ICD-10-CM | POA: Diagnosis not present

## 2020-10-13 DIAGNOSIS — Z7901 Long term (current) use of anticoagulants: Secondary | ICD-10-CM

## 2020-10-13 DIAGNOSIS — Z9889 Other specified postprocedural states: Secondary | ICD-10-CM

## 2020-10-13 DIAGNOSIS — J449 Chronic obstructive pulmonary disease, unspecified: Secondary | ICD-10-CM | POA: Diagnosis not present

## 2020-10-13 DIAGNOSIS — C801 Malignant (primary) neoplasm, unspecified: Secondary | ICD-10-CM | POA: Diagnosis not present

## 2020-10-13 DIAGNOSIS — G4700 Insomnia, unspecified: Secondary | ICD-10-CM | POA: Diagnosis present

## 2020-10-13 DIAGNOSIS — Z79899 Other long term (current) drug therapy: Secondary | ICD-10-CM | POA: Diagnosis not present

## 2020-10-13 DIAGNOSIS — R918 Other nonspecific abnormal finding of lung field: Secondary | ICD-10-CM | POA: Diagnosis not present

## 2020-10-13 DIAGNOSIS — Z87891 Personal history of nicotine dependence: Secondary | ICD-10-CM | POA: Diagnosis not present

## 2020-10-13 DIAGNOSIS — R635 Abnormal weight gain: Secondary | ICD-10-CM | POA: Diagnosis not present

## 2020-10-13 DIAGNOSIS — I480 Paroxysmal atrial fibrillation: Secondary | ICD-10-CM | POA: Diagnosis not present

## 2020-10-13 DIAGNOSIS — F419 Anxiety disorder, unspecified: Secondary | ICD-10-CM

## 2020-10-13 DIAGNOSIS — E222 Syndrome of inappropriate secretion of antidiuretic hormone: Principal | ICD-10-CM | POA: Diagnosis present

## 2020-10-13 DIAGNOSIS — Z888 Allergy status to other drugs, medicaments and biological substances status: Secondary | ICD-10-CM | POA: Diagnosis not present

## 2020-10-13 DIAGNOSIS — Z419 Encounter for procedure for purposes other than remedying health state, unspecified: Secondary | ICD-10-CM

## 2020-10-13 DIAGNOSIS — C771 Secondary and unspecified malignant neoplasm of intrathoracic lymph nodes: Secondary | ICD-10-CM | POA: Diagnosis not present

## 2020-10-13 DIAGNOSIS — R590 Localized enlarged lymph nodes: Secondary | ICD-10-CM | POA: Diagnosis not present

## 2020-10-13 DIAGNOSIS — Z8673 Personal history of transient ischemic attack (TIA), and cerebral infarction without residual deficits: Secondary | ICD-10-CM | POA: Diagnosis not present

## 2020-10-13 DIAGNOSIS — R599 Enlarged lymph nodes, unspecified: Secondary | ICD-10-CM | POA: Diagnosis not present

## 2020-10-13 DIAGNOSIS — C3402 Malignant neoplasm of left main bronchus: Secondary | ICD-10-CM | POA: Diagnosis not present

## 2020-10-13 DIAGNOSIS — J45909 Unspecified asthma, uncomplicated: Secondary | ICD-10-CM

## 2020-10-13 DIAGNOSIS — Z881 Allergy status to other antibiotic agents status: Secondary | ICD-10-CM | POA: Diagnosis not present

## 2020-10-13 DIAGNOSIS — J432 Centrilobular emphysema: Secondary | ICD-10-CM | POA: Diagnosis not present

## 2020-10-13 DIAGNOSIS — Z7951 Long term (current) use of inhaled steroids: Secondary | ICD-10-CM | POA: Diagnosis not present

## 2020-10-13 DIAGNOSIS — Z20822 Contact with and (suspected) exposure to covid-19: Secondary | ICD-10-CM | POA: Diagnosis present

## 2020-10-13 DIAGNOSIS — I10 Essential (primary) hypertension: Secondary | ICD-10-CM | POA: Diagnosis present

## 2020-10-13 DIAGNOSIS — C3490 Malignant neoplasm of unspecified part of unspecified bronchus or lung: Secondary | ICD-10-CM | POA: Diagnosis not present

## 2020-10-13 DIAGNOSIS — G319 Degenerative disease of nervous system, unspecified: Secondary | ICD-10-CM | POA: Diagnosis not present

## 2020-10-13 DIAGNOSIS — Z885 Allergy status to narcotic agent status: Secondary | ICD-10-CM | POA: Diagnosis not present

## 2020-10-13 DIAGNOSIS — I2510 Atherosclerotic heart disease of native coronary artery without angina pectoris: Secondary | ICD-10-CM | POA: Diagnosis not present

## 2020-10-13 DIAGNOSIS — R110 Nausea: Secondary | ICD-10-CM | POA: Diagnosis not present

## 2020-10-13 DIAGNOSIS — M47814 Spondylosis without myelopathy or radiculopathy, thoracic region: Secondary | ICD-10-CM | POA: Diagnosis not present

## 2020-10-13 DIAGNOSIS — I700 Atherosclerosis of aorta: Secondary | ICD-10-CM | POA: Diagnosis not present

## 2020-10-13 LAB — COMPREHENSIVE METABOLIC PANEL
ALT: 23 U/L (ref 0–44)
AST: 20 U/L (ref 15–41)
Albumin: 4.1 g/dL (ref 3.5–5.0)
Alkaline Phosphatase: 76 U/L (ref 38–126)
Anion gap: 9 (ref 5–15)
BUN: 12 mg/dL (ref 8–23)
CO2: 23 mmol/L (ref 22–32)
Calcium: 8.9 mg/dL (ref 8.9–10.3)
Chloride: 86 mmol/L — ABNORMAL LOW (ref 98–111)
Creatinine, Ser: 0.48 mg/dL (ref 0.44–1.00)
GFR, Estimated: >60 mL/min (ref >60)
Glucose, Bld: 133 mg/dL — ABNORMAL HIGH (ref 70–99)
Potassium: 3.6 mmol/L (ref 3.5–5.1)
Sodium: 118 mmol/L — CL (ref 135–145)
Total Bilirubin: 0.6 mg/dL (ref 0.3–1.2)
Total Protein: 6.9 g/dL (ref 6.5–8.1)

## 2020-10-13 LAB — URINALYSIS, ROUTINE W REFLEX MICROSCOPIC
Bacteria, UA: NONE SEEN
Bilirubin Urine: NEGATIVE
Glucose, UA: NEGATIVE mg/dL
Hgb urine dipstick: NEGATIVE
Ketones, ur: NEGATIVE mg/dL
Nitrite: NEGATIVE
Protein, ur: 100 mg/dL — AB
Specific Gravity, Urine: 1.024 (ref 1.005–1.030)
pH: 5 (ref 5.0–8.0)

## 2020-10-13 LAB — CBC WITH DIFFERENTIAL/PLATELET
Abs Immature Granulocytes: 0.02 10*3/uL (ref 0.00–0.07)
Basophils Absolute: 0 10*3/uL (ref 0.0–0.1)
Basophils Relative: 1 %
Eosinophils Absolute: 0.1 10*3/uL (ref 0.0–0.5)
Eosinophils Relative: 3 %
HCT: 40.4 % (ref 36.0–46.0)
Hemoglobin: 14.5 g/dL (ref 12.0–15.0)
Immature Granulocytes: 0 %
Lymphocytes Relative: 18 %
Lymphs Abs: 0.9 10*3/uL (ref 0.7–4.0)
MCH: 34.4 pg — ABNORMAL HIGH (ref 26.0–34.0)
MCHC: 35.9 g/dL (ref 30.0–36.0)
MCV: 95.7 fL (ref 80.0–100.0)
Monocytes Absolute: 0.4 10*3/uL (ref 0.1–1.0)
Monocytes Relative: 9 %
Neutro Abs: 3.4 10*3/uL (ref 1.7–7.7)
Neutrophils Relative %: 69 %
Platelets: 286 10*3/uL (ref 150–400)
RBC: 4.22 MIL/uL (ref 3.87–5.11)
RDW: 13 % (ref 11.5–15.5)
WBC: 4.9 10*3/uL (ref 4.0–10.5)
nRBC: 0 % (ref 0.0–0.2)

## 2020-10-13 MED ORDER — ACETAMINOPHEN 500 MG PO TABS
1000.0000 mg | ORAL_TABLET | Freq: Once | ORAL | Status: AC
  Administered 2020-10-13: 1000 mg via ORAL
  Filled 2020-10-13: qty 2

## 2020-10-13 NOTE — ED Notes (Signed)
Pt ambulatory to bathroom w/ HHA for safety. Walks independently at baseline.

## 2020-10-13 NOTE — ED Provider Notes (Signed)
Emergency Medicine Provider Triage Evaluation Note  Patricia [name] , a 70 y.o. female  was evaluated in triage.  Pt complains of of weakness.  Patient recently admitted on September 23 and discharged on September 28.  Patient had a syncopal episode and was found to be hyponatremic.  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  Patient fluid restricted and started on tolvaptan with significant improvement.  Since being discharged she states that she has been following up with her regular doctor and her sodium has been trending downwards once again.  She was told today that her sodium levels were 118 and she was told to come to the emergency department.  She reports associated weakness but denies any further syncopal episodes.  Denies any alcohol use.  Denies being on diuretic medications.  Physical Exam  BP (!) 163/99 (BP Location: Left Arm)   Pulse 76   Ht 5\' 7"  (1.702 m)   Wt 82.6 kg   LMP  (LMP Unknown)   SpO2 97%   BMI 28.51 kg/m  Gen:   Awake, no distress   Resp:  Normal effort  MSK:   Moves extremities without difficulty  Other:    Medical Decision Making  Medically screening exam initiated at 8:54 PM.  Appropriate orders placed.  Shamar Kracke was informed that the remainder of the evaluation will be completed by another provider, this initial triage assessment does not replace that evaluation, and the importance of remaining in the ED until their evaluation is complete.   Rayna Sexton, PA-C 10/13/20 2056    Charlesetta Shanks, MD 10/24/20 1537

## 2020-10-13 NOTE — ED Triage Notes (Signed)
Pt states her PCP sent her here due to low sodium. Recently admitted for same. Pt also c/o weakness, n/v.

## 2020-10-13 NOTE — ED Provider Notes (Signed)
[name] DEPT Provider Note   CSN: [id number] Arrival date & time: 10/13/20  2034     History Chief Complaint  Patient presents with   Abnormal Lab   Weakness    Patricia [name] is a 70 y.o. female with Pmhx HTN, HLD, GERD who presents to the ED today for abnormal lab.  Was recently admitted to the hospital from 09/23 to 09/28 for low sodium at 126.  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  She was treated with tolvaptan with significant improvement and discharged home.  She states that she saw her PCP earlier this week with a sodium of 135 however today she began feeling generally weak.  She went to her PCP again and they checked her sodium level which was 118 and they advised she come to the ED for further evaluation.  She denies any recent diarrhea or vomiting.  She has no other complaints at this time.   The history is provided by the patient and medical records.      Past Medical History:  Diagnosis Date   Anxiety    GERD (gastroesophageal reflux disease)    Hyperlipidemia    Hypertension    IBS (irritable bowel syndrome)    Insomnia    Low back pain    TIA (transient ischemic attack) 10/2017   no deficits   Vitamin B12 deficiency    Vitamin D deficiency     Patient Active Problem List   Diagnosis Date Noted   Hyponatremia 10/01/2020   Essential hypertension 10/01/2020   Syncope 09/30/2020   Asthmatic bronchitis 08/11/2020   Colles' fracture of right radius, initial encounter for closed fracture 11/19/2019    Past Surgical History:  Procedure Laterality Date   BACK SURGERY     BREAST EXCISIONAL BIOPSY Left 1997   benign   hemorrhoidecotmy     HERNIA MESH REMOVAL     OPEN REDUCTION INTERNAL FIXATION (ORIF) DISTAL RADIAL FRACTURE Right 11/19/2019   Procedure: OPEN REDUCTION INTERNAL FIXATION (ORIF) DISTAL RADIUS AND ULNA FRACTURE WITH REPAIR AS NECESSARY;  Surgeon: Roseanne Kaufman, MD;  Location:  [facility];  Service: Orthopedics;  Laterality: Right;  2 hrs Block with IV sedation   ORIF RADIUS & ULNA FRACTURES     TONSILLECTOMY       OB History   No obstetric history on file.     Family History  Problem Relation Age of Onset   Breast cancer Sister    Breast cancer Maternal Aunt    Breast cancer Paternal Aunt     Social History   Tobacco Use   Smoking status: Former    Packs/day: 1.00    Years: 50.00    Pack years: 50.00    Types: Cigarettes    Start date: 83    Quit date: 12/14/2019    Years since quitting: 0.8   Smokeless tobacco: Never  Vaping Use   Vaping Use: Never used  Substance Use Topics   Alcohol use: Yes    Comment: occ once every 2 months   Drug use: Never    Home Medications Prior to Admission medications   Medication Sig Start Date End Date Taking? Authorizing Provider  acetaminophen (TYLENOL) 325 MG tablet 1 tablet as needed.   Yes [provider]  Black Cohosh (REMIFEMIN PO) Take 1 tablet by mouth in the morning and at bedtime.   Yes [provider]  budesonide-formoterol (SYMBICORT) 80-4.5 MCG/ACT inhaler Take  2 puffs first thing in am and then another 2 puffs about 12 hours later. 08/11/20  Yes Tanda Rockers, MD  carboxymethylcellulose (REFRESH PLUS) 0.5 % SOLN Place 1 drop into both eyes 3 (three) times daily as needed (for dryness).    Yes [provider]  carisoprodol (SOMA) 350 MG tablet Take 350 mg by mouth 3 (three) times daily.  09/26/17  Yes [provider]  Cholecalciferol (VITAMIN D3) 50 MCG (2000 UT) TABS Take 2,000 Units by mouth daily.   Yes [provider]  clobetasol ointment (TEMOVATE) 1.61 % Apply 1 application topically See admin instructions. Apply to vaginal area daily as directed   Yes [provider]  clonazePAM (KLONOPIN) 1 MG tablet Take 1 mg by mouth at bedtime.  10/15/17  Yes [provider]  ELIQUIS 5 MG TABS tablet TAKE 1 TABLET(5 MG) BY MOUTH TWICE DAILY  10/03/20  Yes Camnitz, Will Hassell Done, MD  fluticasone (FLONASE) 50 MCG/ACT nasal spray Place 2 sprays into both nostrils daily as needed for allergies or rhinitis.  10/11/17  Yes [provider]  gabapentin (NEURONTIN) 600 MG tablet Take 600 mg by mouth See admin instructions. Take 600 mg by mouth in the morning and at lunchtime 09/16/17  Yes [provider]  hyoscyamine (LEVSIN) 0.125 MG tablet Take 0.125 mg by mouth as needed. 02/06/10  Yes [provider]  linaclotide Rolan Lipa) 145 MCG CAPS capsule Take 1 capsule (145 mcg total) by mouth daily at 4 PM. 10/03/20  Yes Rizwan, Eunice Blase, MD  losartan (COZAAR) 100 MG tablet Take 100 mg by mouth daily.  08/27/17  Yes [provider]  metoprolol tartrate (LOPRESSOR) 50 MG tablet Take 1 tablet (50 mg total) by mouth 2 (two) times daily. 10/02/20  Yes Debbe Odea, MD  omeprazole (PRILOSEC) 20 MG capsule Take 20 mg by mouth daily before breakfast.  08/27/17  Yes [provider]  ondansetron (ZOFRAN) 4 MG tablet Take 1 tablet (4 mg total) by mouth every 8 (eight) hours as needed for nausea or vomiting. 11/17/19 11/16/20 Yes Roseanne Kaufman, MD  ondansetron (ZOFRAN-ODT) 8 MG disintegrating tablet Take 8 mg by mouth 3 (three) times daily. 10/13/20  Yes [provider]  polyethylene glycol (MIRALAX / GLYCOLAX) packet Take 34 g by mouth in the morning.    Yes [provider]  rosuvastatin (CRESTOR) 5 MG tablet Take 5 mg by mouth 3 (three) times a week. Mondays Wednesdays Fridays   Yes [provider]  traMADol (ULTRAM) 50 MG tablet Take 100 mg by mouth as needed. 10/08/19  Yes [provider]  VENTOLIN HFA 108 (90 Base) MCG/ACT inhaler Inhale 2 puffs into the lungs every 6 (six) hours as needed for wheezing or shortness of breath.  09/30/17  Yes [provider]  zaleplon (SONATA) 10 MG capsule Take 10 mg by mouth at bedtime as needed (for interrupted sleep).    Yes [provider]   hydrOXYzine (ATARAX/VISTARIL) 10 MG tablet Take 10 mg by mouth at bedtime as needed (for sleep). Patient not taking: Reported on 10/13/2020    [provider]    Allergies    Augmentin [amoxicillin-pot clavulanate], Levaquin [levofloxacin in d5w], Mom [magnesium hydroxide], and Oxycodone  Review of Systems   Review of Systems  Constitutional:  Positive for fatigue. Negative for chills and fever.  Cardiovascular:  Negative for chest pain.  Gastrointestinal:  Negative for diarrhea and vomiting.  Neurological:  Positive for weakness (generalized).  All other systems reviewed  and are negative.  Physical Exam Updated Vital Signs BP (!) 153/78   Pulse 72   Temp 97.8 F (36.6 C) (Oral)   Resp 20   Ht 5\' 7"  (1.702 m)   Wt 79.4 kg   LMP  (LMP Unknown)   SpO2 94%   BMI 27.41 kg/m   Physical Exam Vitals and nursing note reviewed.  Constitutional:      Appearance: She is not ill-appearing or diaphoretic.  HENT:     Head: Normocephalic and atraumatic.  Eyes:     Conjunctiva/sclera: Conjunctivae normal.  Cardiovascular:     Rate and Rhythm: Normal rate and regular rhythm.     Pulses: Normal pulses.  Pulmonary:     Effort: Pulmonary effort is normal.     Breath sounds: Normal breath sounds. No wheezing, rhonchi or rales.  Abdominal:     Palpations: Abdomen is soft.     Tenderness: There is no abdominal tenderness.  Musculoskeletal:     Cervical back: Neck supple.  Skin:    General: Skin is warm and dry.  Neurological:     Mental Status: She is alert.     Comments: Alert and oriented to self, place, time and event.   Speech is fluent, clear without dysarthria or dysphasia.   Strength 5/5 in upper/lower extremities   Sensation intact in upper/lower extremities   Normal gait.  Negative Romberg. No pronator drift.  Normal finger-to-nose and feet tapping.  CN I not tested  CN II grossly intact visual fields bilaterally. Did not visualize posterior eye.  CN III,  IV, VI PERRLA and EOMs intact bilaterally  CN V Intact sensation to sharp and light touch to the face  CN VII facial movements symmetric  CN VIII not tested  CN IX, X no uvula deviation, symmetric rise of soft palate  CN XI 5/5 SCM and trapezius strength bilaterally  CN XII Midline tongue protrusion, symmetric L/R movements      ED Results / Procedures / Treatments   Labs (all labs ordered are listed, but only abnormal results are displayed) Labs Reviewed  COMPREHENSIVE METABOLIC PANEL - Abnormal; Notable for the following components:      Result Value   Sodium 118 (*)    Chloride 86 (*)    Glucose, Bld 133 (*)    All other components within normal limits  CBC WITH DIFFERENTIAL/PLATELET - Abnormal; Notable for the following components:   MCH 34.4 (*)    All other components within normal limits  URINALYSIS, ROUTINE W REFLEX MICROSCOPIC - Abnormal; Notable for the following components:   Protein, ur 100 (*)    Leukocytes,Ua SMALL (*)    All other components within normal limits  RESP PANEL BY RT-PCR (FLU A&B, COVID) [id number]    EKG EKG Interpretation  Date/Time:  Thursday October 13 2020 21:36:10 EDT Ventricular Rate:  75 PR Interval:  202 QRS Duration: 87 QT Interval:  408 QTC Calculation: 456 R Axis:   66 Text Interpretation: Sinus rhythm normal, no sig change from previous except rate controlled Confirmed by Charlesetta Shanks [phone]) on 10/13/2020 11:03:24 PM  Radiology No results found.  Procedures Procedures   Medications Ordered in ED Medications  acetaminophen (TYLENOL) tablet 1,000 mg (1,000 mg Oral Given 10/13/20 2333)    ED Course  I have reviewed the triage vital signs and the nursing notes.  Pertinent labs & imaging results that were available during my care of the patient were reviewed by me and considered in my medical  decision making (see chart for details).    MDM Rules/Calculators/A&P                           70 year old female presents to  the ED today for low sodium level of 118.  Recent admission for same, treated with tolvaptan.  Arrival to the ED today vitals are stable.  Patient had screening labs done with a sodium again of 118.  On my exam she has no focal neurodeficits.  She does complain of generalized weakness.  She will need to be admitted again for same.  We will hold off on fluids at this time however as patient does not appear volume depleted.  Discussed case with Dr. Myna Hidalgo who agrees to evaluate patient for admission.   This note was prepared using Dragon voice recognition software and may include unintentional dictation errors due to the inherent limitations of voice recognition software.   Final Clinical Impression(s) / ED Diagnoses Final diagnoses:  Hyponatremia    Rx / DC Orders ED Discharge Orders     None        Eustaquio Maize, PA-C 10/13/20 2337    Charlesetta Shanks, MD 10/24/20 1538

## 2020-10-14 ENCOUNTER — Encounter (HOSPITAL_COMMUNITY): Payer: Self-pay | Admitting: Family Medicine

## 2020-10-14 DIAGNOSIS — G8929 Other chronic pain: Secondary | ICD-10-CM | POA: Diagnosis present

## 2020-10-14 DIAGNOSIS — E871 Hypo-osmolality and hyponatremia: Secondary | ICD-10-CM | POA: Diagnosis not present

## 2020-10-14 DIAGNOSIS — F419 Anxiety disorder, unspecified: Secondary | ICD-10-CM | POA: Diagnosis present

## 2020-10-14 DIAGNOSIS — I471 Supraventricular tachycardia: Secondary | ICD-10-CM | POA: Diagnosis present

## 2020-10-14 DIAGNOSIS — I480 Paroxysmal atrial fibrillation: Secondary | ICD-10-CM | POA: Diagnosis present

## 2020-10-14 LAB — BASIC METABOLIC PANEL
Anion gap: 10 (ref 5–15)
BUN: 8 mg/dL (ref 8–23)
CO2: 25 mmol/L (ref 22–32)
Calcium: 9.1 mg/dL (ref 8.9–10.3)
Chloride: 91 mmol/L — ABNORMAL LOW (ref 98–111)
Creatinine, Ser: 0.43 mg/dL — ABNORMAL LOW (ref 0.44–1.00)
GFR, Estimated: >60 mL/min (ref >60)
Glucose, Bld: 119 mg/dL — ABNORMAL HIGH (ref 70–99)
Potassium: 4 mmol/L (ref 3.5–5.1)
Sodium: 126 mmol/L — ABNORMAL LOW (ref 135–145)

## 2020-10-14 LAB — CBC
HCT: 38.9 % (ref 36.0–46.0)
Hemoglobin: 14.1 g/dL (ref 12.0–15.0)
MCH: 34.7 pg — ABNORMAL HIGH (ref 26.0–34.0)
MCHC: 36.2 g/dL — ABNORMAL HIGH (ref 30.0–36.0)
MCV: 95.8 fL (ref 80.0–100.0)
Platelets: 286 10*3/uL (ref 150–400)
RBC: 4.06 MIL/uL (ref 3.87–5.11)
RDW: 13 % (ref 11.5–15.5)
WBC: 5.5 10*3/uL (ref 4.0–10.5)
nRBC: 0 % (ref 0.0–0.2)

## 2020-10-14 LAB — SODIUM
Sodium: 119 mmol/L — CL (ref 135–145)
Sodium: 121 mmol/L — ABNORMAL LOW (ref 135–145)
Sodium: 133 mmol/L — ABNORMAL LOW (ref 135–145)
Sodium: 119 mmol/L — CL (ref 135–145)
Sodium: 119 mmol/L — CL (ref 135–145)
Sodium: 123 mmol/L — ABNORMAL LOW (ref 135–145)

## 2020-10-14 LAB — RESP PANEL BY RT-PCR (FLU A&B, COVID) ARPGX2
Influenza A by PCR: NEGATIVE
Influenza B by PCR: NEGATIVE
SARS Coronavirus 2 by RT PCR: NEGATIVE

## 2020-10-14 LAB — CORTISOL: Cortisol, Plasma: 15.1 ug/dL

## 2020-10-14 LAB — MRSA NEXT GEN BY PCR, NASAL: MRSA by PCR Next Gen: NOT DETECTED

## 2020-10-14 LAB — OSMOLALITY: Osmolality: 255 mOsm/kg — ABNORMAL LOW (ref 275–295)

## 2020-10-14 LAB — SODIUM, URINE, RANDOM: Sodium, Ur: 83 mmol/L

## 2020-10-14 LAB — OSMOLALITY, URINE: Osmolality, Ur: 755 mOsm/kg (ref 300–900)

## 2020-10-14 LAB — TSH: TSH: 2.021 u[IU]/mL (ref 0.350–4.500)

## 2020-10-14 MED ORDER — LINACLOTIDE 145 MCG PO CAPS
145.0000 ug | ORAL_CAPSULE | Freq: Every day | ORAL | Status: DC
  Administered 2020-10-14 – 2020-10-17 (×3): 145 ug via ORAL
  Filled 2020-10-14 (×7): qty 1

## 2020-10-14 MED ORDER — ROSUVASTATIN CALCIUM 5 MG PO TABS
5.0000 mg | ORAL_TABLET | ORAL | Status: DC
  Administered 2020-10-14 – 2020-10-19 (×3): 5 mg via ORAL
  Filled 2020-10-14 (×4): qty 1

## 2020-10-14 MED ORDER — ACETAMINOPHEN 325 MG PO TABS
650.0000 mg | ORAL_TABLET | Freq: Four times a day (QID) | ORAL | Status: DC | PRN
  Administered 2020-10-14 – 2020-10-16 (×3): 650 mg via ORAL
  Filled 2020-10-14 (×4): qty 2

## 2020-10-14 MED ORDER — PANTOPRAZOLE SODIUM 40 MG PO TBEC
40.0000 mg | DELAYED_RELEASE_TABLET | Freq: Every day | ORAL | Status: DC
  Administered 2020-10-14 – 2020-10-15 (×2): 40 mg via ORAL
  Filled 2020-10-14 (×2): qty 1

## 2020-10-14 MED ORDER — ONDANSETRON HCL 4 MG PO TABS
4.0000 mg | ORAL_TABLET | Freq: Four times a day (QID) | ORAL | Status: DC | PRN
  Administered 2020-10-14: 4 mg via ORAL
  Filled 2020-10-14: qty 1

## 2020-10-14 MED ORDER — MOMETASONE FURO-FORMOTEROL FUM 100-5 MCG/ACT IN AERO
2.0000 | INHALATION_SPRAY | Freq: Two times a day (BID) | RESPIRATORY_TRACT | Status: DC
Start: 2020-10-14 — End: 2020-10-19
  Administered 2020-10-14 – 2020-10-19 (×10): 2 via RESPIRATORY_TRACT
  Filled 2020-10-14 (×2): qty 8.8

## 2020-10-14 MED ORDER — CHLORHEXIDINE GLUCONATE CLOTH 2 % EX PADS
6.0000 | MEDICATED_PAD | Freq: Every day | CUTANEOUS | Status: DC
  Administered 2020-10-14 – 2020-10-19 (×6): 6 via TOPICAL

## 2020-10-14 MED ORDER — CLONAZEPAM 1 MG PO TABS
1.0000 mg | ORAL_TABLET | Freq: Every day | ORAL | Status: DC
  Administered 2020-10-14 (×2): 1 mg via ORAL
  Filled 2020-10-14: qty 1
  Filled 2020-10-14: qty 2

## 2020-10-14 MED ORDER — ALBUTEROL SULFATE (2.5 MG/3ML) 0.083% IN NEBU
3.0000 mL | INHALATION_SOLUTION | Freq: Four times a day (QID) | RESPIRATORY_TRACT | Status: DC | PRN
  Administered 2020-10-14: 3 mL via RESPIRATORY_TRACT
  Filled 2020-10-14: qty 3

## 2020-10-14 MED ORDER — APIXABAN 5 MG PO TABS
5.0000 mg | ORAL_TABLET | Freq: Two times a day (BID) | ORAL | Status: DC
  Administered 2020-10-14 – 2020-10-15 (×5): 5 mg via ORAL
  Filled 2020-10-14 (×5): qty 1

## 2020-10-14 MED ORDER — CARISOPRODOL 350 MG PO TABS
350.0000 mg | ORAL_TABLET | Freq: Three times a day (TID) | ORAL | Status: DC
  Administered 2020-10-14 – 2020-10-19 (×16): 350 mg via ORAL
  Filled 2020-10-14 (×16): qty 1

## 2020-10-14 MED ORDER — POLYVINYL ALCOHOL 1.4 % OP SOLN: 1.0000 [drp] | Freq: Three times a day (TID) | OPHTHALMIC | Status: DC | PRN

## 2020-10-14 MED ORDER — ZOLPIDEM TARTRATE 5 MG PO TABS
5.0000 mg | ORAL_TABLET | Freq: Every evening | ORAL | Status: DC | PRN
  Administered 2020-10-15 – 2020-10-18 (×3): 5 mg via ORAL
  Filled 2020-10-14 (×3): qty 1

## 2020-10-14 MED ORDER — METOPROLOL TARTRATE 25 MG PO TABS
50.0000 mg | ORAL_TABLET | Freq: Two times a day (BID) | ORAL | Status: DC
  Administered 2020-10-14 – 2020-10-15 (×4): 50 mg via ORAL
  Filled 2020-10-14 (×4): qty 2

## 2020-10-14 MED ORDER — POLYETHYLENE GLYCOL 3350 17 G PO PACK: 17.0000 g | PACK | Freq: Every day | ORAL | Status: DC | PRN

## 2020-10-14 MED ORDER — ACETAMINOPHEN 650 MG RE SUPP: 650.0000 mg | Freq: Four times a day (QID) | RECTAL | Status: DC | PRN

## 2020-10-14 MED ORDER — SODIUM CHLORIDE 3 % IV SOLN
INTRAVENOUS | Status: DC
  Filled 2020-10-14 (×2): qty 500

## 2020-10-14 MED ORDER — SODIUM CHLORIDE 3 % IV SOLN
INTRAVENOUS | Status: DC
  Administered 2020-10-15: 32 mL/h via INTRAVENOUS
  Filled 2020-10-14 (×2): qty 500

## 2020-10-14 MED ORDER — HYOSCYAMINE SULFATE 0.125 MG PO TABS
0.1250 mg | ORAL_TABLET | ORAL | Status: DC | PRN
  Filled 2020-10-14: qty 1

## 2020-10-14 MED ORDER — TRAMADOL HCL 50 MG PO TABS
100.0000 mg | ORAL_TABLET | Freq: Two times a day (BID) | ORAL | Status: DC | PRN
  Administered 2020-10-14: 100 mg via ORAL
  Filled 2020-10-14: qty 2

## 2020-10-14 MED ORDER — ONDANSETRON HCL 4 MG/2ML IJ SOLN
4.0000 mg | Freq: Four times a day (QID) | INTRAMUSCULAR | Status: DC | PRN
  Administered 2020-10-14: 4 mg via INTRAVENOUS
  Filled 2020-10-14: qty 2

## 2020-10-14 MED ORDER — GABAPENTIN 300 MG PO CAPS
600.0000 mg | ORAL_CAPSULE | Freq: Two times a day (BID) | ORAL | Status: DC
  Administered 2020-10-14 – 2020-10-15 (×4): 600 mg via ORAL
  Filled 2020-10-14 (×4): qty 2

## 2020-10-14 NOTE — Plan of Care (Signed)
Patient admitted to room 1223. She is alert, oriented x 4, no neurological deficits, but has slight headache. 3% NS infusing and IV watch in place. Initiated purewick external female catheter to maintain strict I/O status as patient reports occasional incontinence. No signs or reports from patient of discomfort or distress. Patient verbalized understanding of care plan including fluid restrictions and safety precautions.    Problem: Education: Goal: Knowledge of General Education information will improve Description: Including pain rating scale, medication(s)/side effects and non-pharmacologic comfort measures Outcome: Progressing   Problem: Health Behavior/Discharge Planning: Goal: Ability to manage health-related needs will improve Outcome: Progressing   Problem: Clinical Measurements: Goal: Ability to maintain clinical measurements within normal limits will improve Outcome: Progressing Goal: Will remain free from infection Outcome: Progressing Goal: Diagnostic test results will improve Outcome: Progressing Goal: Respiratory complications will improve Outcome: Progressing Goal: Cardiovascular complication will be avoided Outcome: Progressing   Problem: Activity: Goal: Risk for activity intolerance will decrease Outcome: Progressing   Problem: Nutrition: Goal: Adequate nutrition will be maintained Outcome: Progressing   Problem: Coping: Goal: Level of anxiety will decrease Outcome: Progressing   Problem: Elimination: Goal: Will not experience complications related to bowel motility Outcome: Progressing Goal: Will not experience complications related to urinary retention Outcome: Progressing   Problem: Pain Managment: Goal: General experience of comfort will improve Outcome: Progressing   Problem: Safety: Goal: Ability to remain free from injury will improve Outcome: Progressing   Problem: Skin Integrity: Goal: Risk for impaired skin integrity will  decrease Outcome: Progressing

## 2020-10-14 NOTE — Consult Note (Signed)
Renal Service Consult Note [facility]  Patricia [name] 10/14/2020 Sol Blazing, MD Requesting Physician: Dr. Pietro Cassis  Reason for Consult: Hyponatremia HPI: The patient is a 70 y.o. year-old w/ hx of HTN, HL, IBS, chronic back pain, TIA, anxiety presented w/ fatigue, nausea and HA. Her PCP checked her labs and Na was low and she was sent to the ED.  In the ED her Na was 118. She was admitted and started on 3% saline overnight at 25 cc/hr. We are asked to see for hyponatremia.   Pt was just admitted and dc'd on 9/28 for hyponatremia w/ Na level of 126 at the lowest, seen by renal and failed fliud restriction but responded to dc of losartan and one does tolvaptan. Na improved to 135 and pt dc'd.   Pt smoked many years and quit a few mos ago, gained 20lbs since then. Losartan stopped during recent, they ^'d her metoprolol form 12.5 (for tachycardia) to 50mg  per day. She takes soma and tramadol for pain , also gabapentin (for many yrs) and Klonopin for nerves. No SSRI. No hx thyroid , cardiac, liver or kidney disease.    ROS - denies CP, no joint pain, no HA, no blurry vision, no rash, no diarrhea, no nausea/ vomiting, no dysuria, no difficulty voiding   Past Medical History  Past Medical History:  Diagnosis Date   Anxiety    GERD (gastroesophageal reflux disease)    Hyperlipidemia    Hypertension    IBS (irritable bowel syndrome)    Insomnia    Low back pain    TIA (transient ischemic attack) 10/2017   no deficits   Vitamin B12 deficiency    Vitamin D deficiency    Past Surgical History  Past Surgical History:  Procedure Laterality Date   BACK SURGERY     BREAST EXCISIONAL BIOPSY Left 1997   benign   hemorrhoidecotmy     HERNIA MESH REMOVAL     OPEN REDUCTION INTERNAL FIXATION (ORIF) DISTAL RADIAL FRACTURE Right 11/19/2019   Procedure: OPEN REDUCTION INTERNAL FIXATION (ORIF) DISTAL RADIUS AND ULNA FRACTURE WITH REPAIR AS NECESSARY;  Surgeon: Roseanne Kaufman, MD;  Location: [facility];  Service: Orthopedics;  Laterality: Right;  2 hrs Block with IV sedation   ORIF RADIUS & ULNA FRACTURES     TONSILLECTOMY     Family History  Family History  Problem Relation Age of Onset   Breast cancer Sister    Breast cancer Maternal Aunt    Breast cancer Paternal Aunt    Social History  reports that she quit smoking about 10 months ago. Her smoking use included cigarettes. She started smoking about 50 years ago. She has a 50.00 pack-year smoking history. She has never used smokeless tobacco. She reports current alcohol use. She reports that she does not use drugs. Allergies  Allergies  Allergen Reactions   Augmentin [Amoxicillin-Pot Clavulanate] Nausea Only and Other (See Comments)    GI upset   Levaquin [Levofloxacin In D5w] Other (See Comments)    Joint problems    Mom [Magnesium Hydroxide] Other (See Comments)    Welts    Oxycodone Other (See Comments)    Nausea    Home medications Prior to Admission medications   Medication Sig Start Date End Date Taking? Authorizing Provider  acetaminophen (TYLENOL) 325 MG tablet 1 tablet as needed.   Yes [provider]  Black Cohosh (REMIFEMIN PO) Take 1 tablet by mouth in the morning and at bedtime.  Yes [provider]  budesonide-formoterol (SYMBICORT) 80-4.5 MCG/ACT inhaler Take 2 puffs first thing in am and then another 2 puffs about 12 hours later. 08/11/20  Yes Tanda Rockers, MD  carboxymethylcellulose (REFRESH PLUS) 0.5 % SOLN Place 1 drop into both eyes 3 (three) times daily as needed (for dryness).    Yes [provider]  carisoprodol (SOMA) 350 MG tablet Take 350 mg by mouth 3 (three) times daily.  09/26/17  Yes [provider]  Cholecalciferol (VITAMIN D3) 50 MCG (2000 UT) TABS Take 2,000 Units by mouth daily.   Yes [provider]  clobetasol ointment (TEMOVATE) 2.77 % Apply 1 application topically See admin instructions. Apply to vaginal area  daily as directed   Yes [provider]  clonazePAM (KLONOPIN) 1 MG tablet Take 1 mg by mouth at bedtime.  10/15/17  Yes [provider]  ELIQUIS 5 MG TABS tablet TAKE 1 TABLET(5 MG) BY MOUTH TWICE DAILY 10/03/20  Yes Camnitz, Will Hassell Done, MD  fluticasone (FLONASE) 50 MCG/ACT nasal spray Place 2 sprays into both nostrils daily as needed for allergies or rhinitis.  10/11/17  Yes [provider]  gabapentin (NEURONTIN) 600 MG tablet Take 600 mg by mouth See admin instructions. Take 600 mg by mouth in the morning and at lunchtime 09/16/17  Yes [provider]  hyoscyamine (LEVSIN) 0.125 MG tablet Take 0.125 mg by mouth as needed. 02/06/10  Yes [provider]  linaclotide Rolan Lipa) 145 MCG CAPS capsule Take 1 capsule (145 mcg total) by mouth daily at 4 PM. 10/03/20  Yes Rizwan, Eunice Blase, MD  losartan (COZAAR) 100 MG tablet Take 100 mg by mouth daily.  08/27/17  Yes [provider]  metoprolol tartrate (LOPRESSOR) 50 MG tablet Take 1 tablet (50 mg total) by mouth 2 (two) times daily. 10/02/20  Yes Debbe Odea, MD  omeprazole (PRILOSEC) 20 MG capsule Take 20 mg by mouth daily before breakfast.  08/27/17  Yes [provider]  ondansetron (ZOFRAN) 4 MG tablet Take 1 tablet (4 mg total) by mouth every 8 (eight) hours as needed for nausea or vomiting. 11/17/19 11/16/20 Yes Roseanne Kaufman, MD  ondansetron (ZOFRAN-ODT) 8 MG disintegrating tablet Take 8 mg by mouth 3 (three) times daily. 10/13/20  Yes [provider]  polyethylene glycol (MIRALAX / GLYCOLAX) packet Take 34 g by mouth in the morning.    Yes [provider]  rosuvastatin (CRESTOR) 5 MG tablet Take 5 mg by mouth 3 (three) times a week. Mondays Wednesdays Fridays   Yes [provider]  traMADol (ULTRAM) 50 MG tablet Take 100 mg by mouth as needed. 10/08/19  Yes [provider]  VENTOLIN HFA 108 (90 Base) MCG/ACT inhaler Inhale 2 puffs into the lungs every 6 (six)  hours as needed for wheezing or shortness of breath.  09/30/17  Yes [provider]  zaleplon (SONATA) 10 MG capsule Take 10 mg by mouth at bedtime as needed (for interrupted sleep).    Yes [provider]  hydrOXYzine (ATARAX/VISTARIL) 10 MG tablet Take 10 mg by mouth at bedtime as needed (for sleep). Patient not taking: Reported on 10/13/2020    [provider]     Vitals:   10/14/20 0500 10/14/20 0600 10/14/20 0800 10/14/20 1103  BP: (!) 167/81 (!) 153/72    Pulse: 63 (!) 57    Resp: (!) 22     Temp: 98.1 F (36.7 C)  98.4 F (36.9 C)   TempSrc: Oral  Oral  SpO2: 94% 93%  97%  Weight: 82.6 kg     Height: 5\' 7"  (1.702 m)      Exam Gen alert, no distress No rash, cyanosis or gangrene Sclera anicteric, throat clear  No jvd or bruits Chest clear bilat to bases, no rales/ wheezing RRR no MRG Abd soft ntnd no mass or ascites +bs GU normal MS no joint effusions or deformity Ext no LE or UE edema, no wounds or ulcers Neuro is alert, Ox 3 , nf     UNa 83,  UOsm 755   UA negative   Na 119  K 4.0 BUN 8  Cr 0.43  Ca 9.1  Alb 3.8  LFT's wnl   WBC 5K  Hb 14  plt 286   CT head 10/01/31 - negative    TSH 2.0, cortisol 15.1     Assessment/ Plan: Hyponatremia - recurrent, 1st episode was milder 2 wks ago, lowest Na was 125. Here now w/ Na 118-119.  No signs of heart, liver or renal failure. Normal thyroid and cortisol tests.  Heavy smoker recently quit.  Will plan to correct w/ 3% saline for now until Na+ > 125.  Continue fluid restriction. When more stable will get CT of chest / abd/ pelvis. CT head was negative in last month.  If negative then will probably have to stop some of her psychotropic medications (clonazepam, gabapentin). Will follow.  HTN - cont metoprolol Chronic pain / anxiety - on clonazepam, soma, tramadol, gabapentin      Rob Yazleemar Strassner  MD 10/14/2020, 1:56 PM  Recent Labs  Lab 10/13/20 2105 10/14/20 0115  WBC 4.9 5.5  HGB 14.5 14.1    Recent Labs  Lab 10/13/20 2105 10/14/20 1036  K 3.6 4.0  BUN 12 8  CREATININE 0.48 0.43*  CALCIUM 8.9 9.1

## 2020-10-14 NOTE — Progress Notes (Signed)
PROGRESS NOTE  [name]  DOB: [birth year]  PCP: [name], MD HBZ:[id number]  DOA: 10/13/2020  LOS: 1 day  Hospital Day: 2   Chief Complaint  Patient presents with   Abnormal Lab   Weakness    Brief narrative: Patricia [name] is a 70 y.o. female with PMH significant for paroxysmal atrial fibrillation on Eliquis, history of TIA/CVA, hypertension, anxiety, chronic pain, and hyponatremia during recent admission. Patient presented to the ED on 10/6 with fatigue, nausea, headache and recurrent hyponatremia on outpatient blood work.  She had a sodium level of 135 on the day of discharge on 10/05/2020, repeat sodium level on 10/6 was very low at 118 and hence directed to the ED. In her recent hospitalization from 9/23 through 9/28, patient had a low sodium level of 126 which further dropped down to 225 with IV fluid.  Nephrology was consulted.  Patient was initiated on fluid restriction and was also given a dose of tolvaptan.  Sodium level at discharge was 135.  In the ED, patient was afebrile, blood pressure elevated 163/99, breathing on room air Recheck of sodium level showed 119, serum osmolality low at 255, urine osmolality elevated to 755.  Labs otherwise unremarkable Admitted to hospitalist service and started on 3% saline See below for details  Subjective: Patient was seen and examined this morning.  Pleasant elderly Caucasian female.  Lying down in bed.  Feels tired.  Daughter at bedside. Sodium level rechecked this morning was at 133.  However it seems that blood sample was taken from the same arm patient was getting 3%..  We will recheck sodium level at this time.  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 -Appears euvolemic on clinical exam. -Complains of severe fatigue, nausea, headache but has no seizure or obtundation -Currently on 3% saline, fluid restriction.  Losartan  on hold. -Continue to monitor sodium level.  Repeat sodium level this morning. -Obtain nephrology consult Recent Labs  Lab 10/13/20 2105 10/14/20 0115 10/14/20 0557 10/14/20 0833  NA 118* 119* 121* 133*   Weight gain in last 1 year -Patient states that for the last 1 year since he left smoking, she has been gaining weight.  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  No history of CHF.  Last echo from 9/24 showed EF of 60 to 65%, no wall motion abnormality, grade 1 diastolic dysfunction.   -Patient states that in the last 1 year, she has had several rounds of prednisone treatment for breathing.  But she is not on regular oral prednisone. -Last chest x-ray from 9/23 did not show any acute findings.  Atrial fibrillation, paroxysmal SVT  -Currently in sinus rhythm -Continue Eliquis and metoprolol    History of TIA/CVA -Continue Eliquis and Crestor   Essential hypertension  -Continue metoprolol, keep losartan on hold.    Chronic pain  -Continue home meds -Neurontin, tramadol   Anxiety, insomnia  -Continue home regimen  -Soma, Klonopin, Sonata   Chronic bronchitis  -Stable on admission  -Continue ICS/LABA and as-needed albuterol    Constipation -Continue Linzess   Mobility: Encourage ambulation Code Status:   Code Status: Full Code  Nutritional status: Body mass index is 28.52 kg/m.     Diet:  Diet Order             Diet Heart Room service appropriate? Yes; Fluid consistency: Thin; Fluid restriction: 1500 mL Fluid  Diet effective now  DVT prophylaxis:   apixaban (ELIQUIS) tablet 5 mg   Antimicrobials: None Fluid: 3% saline at 25 mill per hour Consultants: Nephrology Family Communication: Daughter Ms. Hilliary at bedside  Status is: Inpatient  Remains inpatient appropriate because: Needs correction of sodium level  Dispo: The patient is from: Home              Anticipated d/c is to: Home in 2 to 3 days              Patient currently is  not medically stable to d/c.   Difficult to place patient No     Infusions:   sodium chloride (hypertonic) 25 mL/hr at 10/14/20 0500    Scheduled Meds:  apixaban  5 mg Oral BID   carisoprodol  350 mg Oral TID   Chlorhexidine Gluconate Cloth  6 each Topical Daily   clonazePAM  1 mg Oral QHS   gabapentin  600 mg Oral BID WC   linaclotide  145 mcg Oral q1600   metoprolol tartrate  50 mg Oral BID   mometasone-formoterol  2 puff Inhalation BID   pantoprazole  40 mg Oral Daily   rosuvastatin  5 mg Oral Once per day on Mon Wed Fri    Antimicrobials: Anti-infectives (From admission, onward)    None       PRN meds: acetaminophen **OR** acetaminophen, albuterol, hyoscyamine, ondansetron **OR** ondansetron (ZOFRAN) IV, polyethylene glycol, polyvinyl alcohol, traMADol, zolpidem   Objective: Vitals:   10/14/20 0600 10/14/20 0800  BP: (!) 153/72   Pulse: (!) 57   Resp:    Temp:  98.4 F (36.9 C)  SpO2: 93%     Intake/Output Summary (Last 24 hours) at 10/14/2020 1031 Last data filed at 10/14/2020 0500 Gross per 24 hour  Intake 279.46 ml  Output --  Net 279.46 ml   Filed Weights   10/13/20 2048 10/13/20 2137 10/14/20 0500  Weight: 82.6 kg 79.4 kg 82.6 kg   Weight change:  Body mass index is 28.52 kg/m.   Physical Exam: General exam: Pleasant, elderly Caucasian female.  Not in distress at this time Skin: No rashes, lesions or ulcers. HEENT: Atraumatic, normocephalic, no obvious bleeding Lungs: Clear to auscultation bilaterally CVS: Regular rate and rhythm, no murmur GI/Abd soft, nontender, nondistended, bowel sound present CNS: Alert, awake, oriented x3 Psychiatry: Mood appropriate Extremities: No pedal edema, no calf tenderness  Data Review: I have personally reviewed the laboratory data and studies available.  Recent Labs  Lab 10/13/20 2105 10/14/20 0115  WBC 4.9 5.5  NEUTROABS 3.4  --   HGB 14.5 14.1  HCT 40.4 38.9  MCV 95.7 95.8  PLT 286 286    Recent Labs  Lab 10/13/20 2105 10/14/20 0115 10/14/20 0557 10/14/20 0833  NA 118* 119* 121* 133*  K 3.6  --   --   --   CL 86*  --   --   --   CO2 23  --   --   --   GLUCOSE 133*  --   --   --   BUN 12  --   --   --   CREATININE 0.48  --   --   --   CALCIUM 8.9  --   --   --     F/u labs ordered Unresulted Labs (From admission, onward)     Start     Ordered   10/15/20 0177  Basic metabolic panel  Daily,   R  Question:  Specimen collection method  Answer:  Lab=Lab collect   10/14/20 0903   10/14/20 7939  Basic metabolic panel  ONCE - STAT,   STAT       Question:  Specimen collection method  Answer:  Lab=Lab collect   10/14/20 1012   10/14/20 0948  Sodium  Once-Timed,   TIMED       Question:  Specimen collection method  Answer:  Lab=Lab collect   10/14/20 0947   10/14/20 0500  CBC  Daily,   R      10/14/20 0259            Signed, Terrilee Croak, MD [facility] 10/14/2020

## 2020-10-14 NOTE — H&P (Signed)
History and Physical    Patricia [name] HBZ:[id number] DOB: Nov 05, 1950 DOA: 10/13/2020  PCP: [name], MD   Patient coming from: home   Chief Complaint: malaise, fatigue, nausea, headache, low sodium   HPI: Patricia [name] is a 70 y.o. female with medical history significant for paroxysmal atrial fibrillation on Eliquis, history of TIA/CVA, hypertension, anxiety, chronic pain, and hyponatremia during recent admission, now returning to the emergency department with fatigue, nausea, headache, and recurrent hyponatremia on outpatient blood work.  The patient reports that she was feeling well after the hospital discharge on 10/05/2020, had a serum sodium of 131 (per patient report) when she followed up with her PCP on 10/10/2020, but then asked to be seen again today after developing severe fatigue, general malaise, nausea, and headache, and had repeat blood work which was concerning for sodium of 118.  She was contacted with these results and directed to the ED.  She reports adherence with fluid restriction since the recent discharge.  She was given at a dose of tolvaptan during the recent admission but reports no new medications since discharge.  ED Course: Upon arrival to the ED, patient is found to be afebrile, saturating mid 90s on room air, and with stable blood pressure.  EKG features sinus rhythm.  Chemistry panel with sodium 118.  CBC unremarkable.  Patient was given a dose of acetaminophen in the emergency department and hospitalists asked to admit.  Review of Systems:  All other systems reviewed and apart from HPI, are negative.  Past Medical History:  Diagnosis Date   Anxiety    GERD (gastroesophageal reflux disease)    Hyperlipidemia    Hypertension    IBS (irritable bowel syndrome)    Insomnia    Low back pain    TIA (transient ischemic attack) 10/2017   no deficits   Vitamin B12 deficiency    Vitamin D deficiency     Past Surgical History:  Procedure Laterality  Date   BACK SURGERY     BREAST EXCISIONAL BIOPSY Left 1997   benign   hemorrhoidecotmy     HERNIA MESH REMOVAL     OPEN REDUCTION INTERNAL FIXATION (ORIF) DISTAL RADIAL FRACTURE Right 11/19/2019   Procedure: OPEN REDUCTION INTERNAL FIXATION (ORIF) DISTAL RADIUS AND ULNA FRACTURE WITH REPAIR AS NECESSARY;  Surgeon: Roseanne Kaufman, MD;  Location: [facility];  Service: Orthopedics;  Laterality: Right;  2 hrs Block with IV sedation   ORIF RADIUS & ULNA FRACTURES     TONSILLECTOMY      Social History:   reports that she quit smoking about 10 months ago. Her smoking use included cigarettes. She started smoking about 50 years ago. She has a 50.00 pack-year smoking history. She has never used smokeless tobacco. She reports current alcohol use. She reports that she does not use drugs.  Allergies  Allergen Reactions   Augmentin [Amoxicillin-Pot Clavulanate] Nausea Only and Other (See Comments)    GI upset   Levaquin [Levofloxacin In D5w] Other (See Comments)    Joint problems    Mom [Magnesium Hydroxide] Other (See Comments)    Welts    Oxycodone Other (See Comments)    Nausea     Family History  Problem Relation Age of Onset   Breast cancer Sister    Breast cancer Maternal Aunt    Breast cancer Paternal Aunt      Prior to Admission medications   Medication Sig Start Date End Date Taking? Authorizing Provider  acetaminophen (TYLENOL) 325 MG  tablet 1 tablet as needed.   Yes [provider]  Black Cohosh (REMIFEMIN PO) Take 1 tablet by mouth in the morning and at bedtime.   Yes [provider]  budesonide-formoterol (SYMBICORT) 80-4.5 MCG/ACT inhaler Take 2 puffs first thing in am and then another 2 puffs about 12 hours later. 08/11/20  Yes Tanda Rockers, MD  carboxymethylcellulose (REFRESH PLUS) 0.5 % SOLN Place 1 drop into both eyes 3 (three) times daily as needed (for dryness).    Yes [provider]  carisoprodol (SOMA) 350 MG tablet Take 350 mg by mouth 3  (three) times daily.  09/26/17  Yes [provider]  Cholecalciferol (VITAMIN D3) 50 MCG (2000 UT) TABS Take 2,000 Units by mouth daily.   Yes [provider]  clobetasol ointment (TEMOVATE) 9.89 % Apply 1 application topically See admin instructions. Apply to vaginal area daily as directed   Yes [provider]  clonazePAM (KLONOPIN) 1 MG tablet Take 1 mg by mouth at bedtime.  10/15/17  Yes [provider]  ELIQUIS 5 MG TABS tablet TAKE 1 TABLET(5 MG) BY MOUTH TWICE DAILY 10/03/20  Yes Camnitz, Will Hassell Done, MD  fluticasone (FLONASE) 50 MCG/ACT nasal spray Place 2 sprays into both nostrils daily as needed for allergies or rhinitis.  10/11/17  Yes [provider]  gabapentin (NEURONTIN) 600 MG tablet Take 600 mg by mouth See admin instructions. Take 600 mg by mouth in the morning and at lunchtime 09/16/17  Yes [provider]  hyoscyamine (LEVSIN) 0.125 MG tablet Take 0.125 mg by mouth as needed. 02/06/10  Yes [provider]  linaclotide Rolan Lipa) 145 MCG CAPS capsule Take 1 capsule (145 mcg total) by mouth daily at 4 PM. 10/03/20  Yes Rizwan, Eunice Blase, MD  losartan (COZAAR) 100 MG tablet Take 100 mg by mouth daily.  08/27/17  Yes [provider]  metoprolol tartrate (LOPRESSOR) 50 MG tablet Take 1 tablet (50 mg total) by mouth 2 (two) times daily. 10/02/20  Yes Debbe Odea, MD  omeprazole (PRILOSEC) 20 MG capsule Take 20 mg by mouth daily before breakfast.  08/27/17  Yes [provider]  ondansetron (ZOFRAN) 4 MG tablet Take 1 tablet (4 mg total) by mouth every 8 (eight) hours as needed for nausea or vomiting. 11/17/19 11/16/20 Yes Roseanne Kaufman, MD  ondansetron (ZOFRAN-ODT) 8 MG disintegrating tablet Take 8 mg by mouth 3 (three) times daily. 10/13/20  Yes [provider]  polyethylene glycol (MIRALAX / GLYCOLAX) packet Take 34 g by mouth in the morning.    Yes [provider]  rosuvastatin (CRESTOR) 5 MG tablet  Take 5 mg by mouth 3 (three) times a week. Mondays Wednesdays Fridays   Yes [provider]  traMADol (ULTRAM) 50 MG tablet Take 100 mg by mouth as needed. 10/08/19  Yes [provider]  VENTOLIN HFA 108 (90 Base) MCG/ACT inhaler Inhale 2 puffs into the lungs every 6 (six) hours as needed for wheezing or shortness of breath.  09/30/17  Yes [provider]  zaleplon (SONATA) 10 MG capsule Take 10 mg by mouth at bedtime as needed (for interrupted sleep).    Yes [provider]  hydrOXYzine (ATARAX/VISTARIL) 10 MG tablet Take 10 mg by mouth at bedtime as needed (for sleep). Patient not taking: Reported on 10/13/2020    [provider]    Physical Exam: Vitals:   10/13/20 2300 10/13/20 2330 10/14/20 0030 10/14/20 0115  BP: (!) 153/91 (!) 153/78 (!) 161/90 (!) 153/101  Pulse: 70 72 66 72  Resp: 19 20 19 13   Temp:      TempSrc:      SpO2: 94% 94% 94% 98%  Weight:      Height:        Constitutional: NAD, calm  Eyes: PERTLA, lids and conjunctivae normal ENMT: Mucous membranes are moist. Posterior pharynx clear of any exudate or lesions.   Neck: supple, no masses  Respiratory:  no wheezing, no crackles. No accessory muscle use.  Cardiovascular: S1 & S2 heard, regular rate and rhythm. No extremity edema.   Abdomen: No distension, no tenderness, soft. Bowel sounds active.  Musculoskeletal: no clubbing / cyanosis. No joint deformity upper and lower extremities.   Skin: no significant rashes, lesions, ulcers. Warm, dry, well-perfused. Neurologic: CN 2-12 grossly intact. Moving all extremities. Alert and oriented.  Psychiatric: Pleasant. Cooperative.    Labs and Imaging on Admission: I have personally reviewed following labs and imaging studies  CBC: Recent Labs  Lab 10/13/20 2105  WBC 4.9  NEUTROABS 3.4  HGB 14.5  HCT 40.4  MCV 95.7  PLT 950   Basic Metabolic Panel: Recent Labs  Lab 10/13/20 2105  NA 118*  K 3.6  CL 86*  CO2 23   GLUCOSE 133*  BUN 12  CREATININE 0.48  CALCIUM 8.9   GFR: Estimated Creatinine Clearance: 70 mL/min (by C-G formula based on SCr of 0.48 mg/dL). Liver Function Tests: Recent Labs  Lab 10/13/20 2105  AST 20  ALT 23  ALKPHOS 76  BILITOT 0.6  PROT 6.9  ALBUMIN 4.1   No results for input(s): LIPASE, AMYLASE in the last 168 hours. No results for input(s): AMMONIA in the last 168 hours. Coagulation Profile: No results for input(s): INR, PROTIME in the last 168 hours. Cardiac Enzymes: No results for input(s): CKTOTAL, CKMB, CKMBINDEX, TROPONINI in the last 168 hours. BNP (last 3 results) No results for input(s): PROBNP in the last 8760 hours. HbA1C: No results for input(s): HGBA1C in the last 72 hours. CBG: No results for input(s): GLUCAP in the last 168 hours. Lipid Profile: No results for input(s): CHOL, HDL, LDLCALC, TRIG, CHOLHDL, LDLDIRECT in the last 72 hours. Thyroid Function Tests: No results for input(s): TSH, T4TOTAL, FREET4, T3FREE, THYROIDAB in the last 72 hours. Anemia Panel: No results for input(s): VITAMINB12, FOLATE, FERRITIN, TIBC, IRON, RETICCTPCT in the last 72 hours. Urine analysis:    Component Value Date/Time   COLORURINE YELLOW 10/13/2020 2130   APPEARANCEUR CLEAR 10/13/2020 2130   LABSPEC 1.024 10/13/2020 2130   PHURINE 5.0 10/13/2020 2130   GLUCOSEU NEGATIVE 10/13/2020 2130   HGBUR NEGATIVE 10/13/2020 2130   [place] NEGATIVE 10/13/2020 2130   [place] NEGATIVE 10/13/2020 2130   PROTEINUR 100 (A) 10/13/2020 2130   NITRITE NEGATIVE 10/13/2020 2130   LEUKOCYTESUR SMALL (A) 10/13/2020 2130   Sepsis Labs: @LABRCNTIP (procalcitonin:4,lacticidven:4) ) Recent Results (from the past 240 hour(s))  Resp Panel by RT-PCR (Flu A&B, Covid) Nasopharyngeal Swab     Status: None   Collection Time: 10/13/20 11:14 PM   Specimen: Nasopharyngeal Swab; Nasopharyngeal(NP) swabs in vial transport medium  Result Value Ref Range Status   SARS Coronavirus 2 by  RT PCR NEGATIVE NEGATIVE Final    Comment: (NOTE) SARS-CoV-2 target nucleic acids are NOT DETECTED.  The SARS-CoV-2 RNA is generally detectable in upper respiratory specimens during the acute phase of infection. The lowest concentration of SARS-CoV-2 viral copies this assay can detect is 138 copies/mL. A negative result does not preclude SARS-Cov-2 infection  and should not be used as the sole basis for treatment or other patient management decisions. A negative result may occur with  improper specimen collection/handling, submission of specimen other than nasopharyngeal swab, presence of viral mutation(s) within the areas targeted by this assay, and inadequate number of viral copies(<138 copies/mL). A negative result must be combined with clinical observations, patient history, and epidemiological information. The expected result is Negative.  Fact Sheet for Patients:  EntrepreneurPulse.com.au  Fact Sheet for Healthcare Providers:  IncredibleEmployment.be  This test is no t yet approved or cleared by the [place] FDA and  has been authorized for detection and/or diagnosis of SARS-CoV-2 by FDA under an Emergency Use Authorization (EUA). This EUA will remain  in effect (meaning this test can be used) for the duration of the COVID-19 declaration under Section 564(b)(1) of the Act, 21 U.S.C.section 360bbb-3(b)(1), unless the authorization is terminated  or revoked sooner.       Influenza A by PCR NEGATIVE NEGATIVE Final   Influenza B by PCR NEGATIVE NEGATIVE Final    Comment: (NOTE) The Xpert Xpress SARS-CoV-2/FLU/RSV plus assay is intended as an aid in the diagnosis of influenza from Nasopharyngeal swab specimens and should not be used as a sole basis for treatment. Nasal washings and aspirates are unacceptable for Xpert Xpress SARS-CoV-2/FLU/RSV testing.  Fact Sheet for Patients: EntrepreneurPulse.com.au  Fact Sheet  for Healthcare Providers: IncredibleEmployment.be  This test is not yet approved or cleared by the [place] FDA and has been authorized for detection and/or diagnosis of SARS-CoV-2 by FDA under an Emergency Use Authorization (EUA). This EUA will remain in effect (meaning this test can be used) for the duration of the COVID-19 declaration under Section 564(b)(1) of the Act, 21 U.S.C. section 360bbb-3(b)(1), unless the authorization is terminated or revoked.  Performed at [facility], [place] [address] [place], [place] [address]      Radiological Exams on Admission: No results found.  EKG: Independently reviewed. Sinus rhythm.   Assessment/Plan   1. Hyponatremia  - Patient with hyponatremia on recent admission that corrected with tolvaptan and was attributed to SIADH presents with recurrent hyponatremia on outpatient labs despite fluid restriction  - Serum sodium is 118 in ED  - Appears euvolemic  - She has severe fatigue, nausea, and headache but no seizure or obtundation  - Continue fluid restrictions, hold losartan, start slow 3% saline infusion with frequent sodium levels with goal sodium 122-124 in 24 hrs    2. Atrial fibrillation, paroxysmal SVT  - In SR on admission  - Continue Eliquis and metoprolol    3. Hypertension  - Hold losartan, continue metoprolol    4. Chronic pain  - No pain complaints on admission  - Database reviewed, continue home regimen    5. Anxiety, insomnia  - Continue home regimen    6. Chronic bronchitis  - Stable on admission  - Continue ICS/LABA and as-needed albuterol     DVT prophylaxis: Eliquis  Code Status: Full  Level of Care: Level of care: Stepdown Family Communication: None present  Disposition Plan:  Patient is from: Home  Anticipated d/c is to: Home  Anticipated d/c date is: 10/17/20  Patient currently: Pending correction of hyponatremia  Consults called: none  Admission status:  Inpatient     Vianne Bulls, MD [facility]  10/14/2020, 3:01 AM

## 2020-10-15 ENCOUNTER — Encounter (HOSPITAL_COMMUNITY): Payer: Self-pay | Admitting: Family Medicine

## 2020-10-15 ENCOUNTER — Inpatient Hospital Stay (HOSPITAL_COMMUNITY): Payer: Medicare HMO

## 2020-10-15 DIAGNOSIS — E871 Hypo-osmolality and hyponatremia: Secondary | ICD-10-CM | POA: Diagnosis not present

## 2020-10-15 LAB — BASIC METABOLIC PANEL
Anion gap: 6 (ref 5–15)
Anion gap: 5 (ref 5–15)
BUN: 10 mg/dL (ref 8–23)
BUN: 8 mg/dL (ref 8–23)
CO2: 24 mmol/L (ref 22–32)
CO2: 26 mmol/L (ref 22–32)
Calcium: 8.6 mg/dL — ABNORMAL LOW (ref 8.9–10.3)
Calcium: 8.5 mg/dL — ABNORMAL LOW (ref 8.9–10.3)
Chloride: 97 mmol/L — ABNORMAL LOW (ref 98–111)
Chloride: 88 mmol/L — ABNORMAL LOW (ref 98–111)
Creatinine, Ser: 0.55 mg/dL (ref 0.44–1.00)
Creatinine, Ser: 0.53 mg/dL (ref 0.44–1.00)
GFR, Estimated: >60 mL/min (ref >60)
GFR, Estimated: >60 mL/min (ref >60)
Glucose, Bld: 93 mg/dL (ref 70–99)
Glucose, Bld: 83 mg/dL (ref 70–99)
Potassium: 4.3 mmol/L (ref 3.5–5.1)
Potassium: 4.3 mmol/L (ref 3.5–5.1)
Sodium: 127 mmol/L — ABNORMAL LOW (ref 135–145)
Sodium: 119 mmol/L — CL (ref 135–145)

## 2020-10-15 LAB — CBC
HCT: 36.2 % (ref 36.0–46.0)
Hemoglobin: 13.2 g/dL (ref 12.0–15.0)
MCH: 34.4 pg — ABNORMAL HIGH (ref 26.0–34.0)
MCHC: 36.5 g/dL — ABNORMAL HIGH (ref 30.0–36.0)
MCV: 94.3 fL (ref 80.0–100.0)
Platelets: 235 10*3/uL (ref 150–400)
RBC: 3.84 MIL/uL — ABNORMAL LOW (ref 3.87–5.11)
RDW: 12.8 % (ref 11.5–15.5)
WBC: 4.2 10*3/uL (ref 4.0–10.5)
nRBC: 0 % (ref 0.0–0.2)

## 2020-10-15 LAB — HEPATIC FUNCTION PANEL
ALT: 17 U/L (ref 0–44)
AST: 16 U/L (ref 15–41)
Albumin: 3.9 g/dL (ref 3.5–5.0)
Alkaline Phosphatase: 68 U/L (ref 38–126)
Bilirubin, Direct: <0.1 mg/dL (ref 0.0–0.2)
Total Bilirubin: 0.3 mg/dL (ref 0.3–1.2)
Total Protein: 6.3 g/dL — ABNORMAL LOW (ref 6.5–8.1)

## 2020-10-15 LAB — SODIUM
Sodium: 119 mmol/L — CL (ref 135–145)
Sodium: 121 mmol/L — ABNORMAL LOW (ref 135–145)
Sodium: 122 mmol/L — ABNORMAL LOW (ref 135–145)
Sodium: 129 mmol/L — ABNORMAL LOW (ref 135–145)

## 2020-10-15 LAB — OSMOLALITY: Osmolality: 255 mOsm/kg — ABNORMAL LOW (ref 275–295)

## 2020-10-15 MED ORDER — GABAPENTIN 300 MG PO CAPS
600.0000 mg | ORAL_CAPSULE | Freq: Two times a day (BID) | ORAL | Status: DC
  Administered 2020-10-16 – 2020-10-19 (×6): 600 mg via ORAL
  Filled 2020-10-15 (×6): qty 2

## 2020-10-15 MED ORDER — TOLVAPTAN 15 MG PO TABS
15.0000 mg | ORAL_TABLET | Freq: Once | ORAL | Status: AC
  Administered 2020-10-15: 15 mg via ORAL
  Filled 2020-10-15: qty 1

## 2020-10-15 MED ORDER — TOLVAPTAN 15 MG PO TABS: 15.0000 mg | ORAL_TABLET | Freq: Once | ORAL | Status: DC

## 2020-10-15 MED ORDER — DEXTROSE 5 % IV SOLN: INTRAVENOUS | Status: DC

## 2020-10-15 MED ORDER — IOHEXOL 350 MG/ML SOLN
80.0000 mL | Freq: Once | INTRAVENOUS | Status: AC | PRN
  Administered 2020-10-15: 80 mL via INTRAVENOUS

## 2020-10-15 MED ORDER — METOPROLOL TARTRATE 25 MG PO TABS
25.0000 mg | ORAL_TABLET | Freq: Two times a day (BID) | ORAL | Status: DC
  Administered 2020-10-15 – 2020-10-17 (×4): 25 mg via ORAL
  Filled 2020-10-15 (×4): qty 1

## 2020-10-15 MED ORDER — IOHEXOL 9 MG/ML PO SOLN
500.0000 mL | ORAL | Status: AC
Start: 2020-10-15 — End: 2020-10-15
  Administered 2020-10-15 (×2): 500 mL via ORAL

## 2020-10-15 MED ORDER — CLONAZEPAM 1 MG PO TABS
1.0000 mg | ORAL_TABLET | Freq: Every day | ORAL | Status: DC
  Administered 2020-10-16 – 2020-10-18 (×4): 1 mg via ORAL
  Filled 2020-10-15 (×4): qty 1

## 2020-10-15 NOTE — Evaluation (Signed)
Physical Therapy Evaluation Patient Details Name: Patricia [name] MRN: [id number] DOB: [birth year] Today's Date: 10/15/2020  History of Present Illness  70 y.o. female admitted on 10/13/20 formaliase, fatigue, nausea, headache, low sodium levels.  Suspected SIADH.  Pt with significant PMH of HTN, back surgery, ORIF radius and ulna fx R.  Clinical Impression  Pt is mildly weak and mildly wobbly on her feet, but compensates with hallway railing.  She reports she has pulled out her mother's walker at home (mother is no longer with [place]) and uses it for support and husband is with her, "nearly all the time".  I am confident once her sodium normalizes and she gets home and moving more she will be back to her normal independent self.  PT to bring rollator next time to encourage increased gait distance without fear of fatigue.   PT to follow acutely for deficits listed below.        Recommendations for follow up therapy are one component of a multi-disciplinary discharge planning process, led by the attending physician.  Recommendations may be updated based on patient status, additional functional criteria and insurance authorization.  Follow Up Recommendations No PT follow up    Equipment Recommendations  None recommended by PT    Recommendations for Other Services       Precautions / Restrictions Precautions Precautions: Fall Precaution Comments: weak and unsteday      Mobility  Bed Mobility Overal bed mobility: Modified Independent                  Transfers Overall transfer level: Needs assistance Equipment used: None Transfers: Sit to/from Stand Sit to Stand: Supervision         General transfer comment: close supervision for safety from bed and lower toilet, used hands for transitions and grab bars in bathroom.  Ambulation/Gait Ambulation/Gait assistance: Min guard Gait Distance (Feet): 200 Feet Assistive device:  (hallway railing) Gait Pattern/deviations:  Step-through pattern;Staggering right;Staggering left     General Gait Details: Pt with staggering gait patttern reaching for hallway railing and when not available holding PT's hand.  Pt does well with light support, just feels weak since her sodium levels have been low.  Stairs            Wheelchair Mobility    Modified Rankin (Stroke Patients Only)       Balance Overall balance assessment: Mild deficits observed, not formally tested                                           Pertinent Vitals/Pain Pain Assessment: No/denies pain    Home Living Family/patient expects to be discharged to:: Private residence Living Arrangements: Spouse/significant other Available Help at Discharge: Family;Available 24 hours/day Type of Home: House Home Access: Stairs to enter     Home Layout: One level Home Equipment: Bedside commode;Hand held shower head;Walker - 2 wheels      Prior Function Level of Independence: Independent         Comments: got her mother in law's walker out to use as she was feeling weak at home     Hand Dominance   Dominant Hand: Right    Extremity/Trunk Assessment   Upper Extremity Assessment Upper Extremity Assessment: Generalized weakness    Lower Extremity Assessment Lower Extremity Assessment: Generalized weakness (at least 3+/5 per gross functional assessment and equal bil)  Cervical / Trunk Assessment Cervical / Trunk Assessment: Other exceptions Cervical / Trunk Exceptions: h/o back surgery  Communication   Communication: No difficulties  Cognition Arousal/Alertness: Awake/alert Behavior During Therapy: WFL for tasks assessed/performed Overall Cognitive Status: Within Functional Limits for tasks assessed                                        General Comments General comments (skin integrity, edema, etc.): Vss on RA    Exercises     Assessment/Plan    PT Assessment Patient needs continued  PT services  PT Problem List Decreased strength;Decreased activity tolerance;Decreased balance;Decreased mobility;Decreased knowledge of use of DME;Decreased knowledge of precautions       PT Treatment Interventions DME instruction;Gait training;Stair training;Functional mobility training;Therapeutic activities;Therapeutic exercise;Balance training;Patient/family education    PT Goals (Current goals can be found in the Care Plan section)  Acute Rehab PT Goals Patient Stated Goal: to go home soon if she can and get back to feeling stronger PT Goal Formulation: With patient/family Time For Goal Achievement: 10/29/20 Potential to Achieve Goals: Good    Frequency Min 3X/week   Barriers to discharge        Co-evaluation               AM-PAC PT "6 Clicks" Mobility  Outcome Measure Help needed turning from your back to your side while in a flat bed without using bedrails?: None Help needed moving from lying on your back to sitting on the side of a flat bed without using bedrails?: None Help needed moving to and from a bed to a chair (including a wheelchair)?: A Little Help needed standing up from a chair using your arms (e.g., wheelchair or bedside chair)?: A Little Help needed to walk in hospital room?: A Little Help needed climbing 3-5 steps with a railing? : A Little 6 Click Score: 20    End of Session   Activity Tolerance: Patient limited by fatigue Patient left: in bed;with call bell/phone within reach;with family/visitor present Nurse Communication: Mobility status PT Visit Diagnosis: Muscle weakness (generalized) (M62.81);Unsteadiness on feet (R26.81)    Time: 8657-8469 PT Time Calculation (min) (ACUTE ONLY): 24 min   Charges:   PT Evaluation $PT Eval Moderate Complexity: 1 Mod PT Treatments $Gait Training: 8-22 mins        Verdene Lennert, PT, DPT  Acute Rehabilitation Ortho Tech Supervisor [phone] pager [phone]) [phone] office

## 2020-10-15 NOTE — Progress Notes (Signed)
PROGRESS NOTE  Patricia [name]  DOB: 1950-07-20  PCP: [name], MD BPZ:[id number]  DOA: 10/13/2020  LOS: 2 days  Hospital Day: 3   Chief Complaint  Patient presents with   Abnormal Lab   Weakness    Brief narrative: Patricia [name] is a 70 y.o. female with PMH significant for paroxysmal atrial fibrillation on Eliquis, history of TIA/CVA, hypertension, anxiety, chronic pain, and hyponatremia during recent admission. Patient presented to the ED on 10/6 with fatigue, nausea, headache and recurrent hyponatremia on outpatient blood work.  She had a sodium level of 135 on the day of discharge on 10/05/2020, repeat sodium level on 10/6 was very low at 118 and hence directed to the ED. In her recent hospitalization from 9/23 through 9/28, patient had a low sodium level of 126 which further dropped down to 225 with IV fluid.  Nephrology was consulted.  Patient was initiated on fluid restriction and was also given a dose of tolvaptan.  Sodium level at discharge was 135.  In the ED, patient was afebrile, blood pressure elevated 163/99, breathing on room air Recheck of sodium level showed 119, serum osmolality low at 255, urine osmolality elevated to 755.  Labs otherwise unremarkable Admitted to hospitalist service and started on 3% saline See below for details  Subjective: Patient was seen and examined this morning.   Lying down in bed.  Not in distress.  No family at bedside.   Sodium level being monitored every 4 hours.  Last check was 127 which is up from 121 4 hours prior.  3% saline was held after the last reading.  Repeat blood work pending this morning.    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  Euvolemic on exam. -Currently on 3% saline which is as of now on hold since 4:00 this morning after the sodium level of 127.  Repeat level pending at  this time. -Nephrology following. -I ordered for CT chest/abdomen/pelvis with contrast to look for any possible malignancy as a cause of hyponatremia in a patient with history of smoking. Recent Labs  Lab 10/13/20 2105 10/14/20 0115 10/14/20 0557 10/14/20 0833 10/14/20 1036 10/14/20 1207 10/14/20 1542 10/14/20 2003 10/15/20 0013 10/15/20 0407  NA 118* 119* 121* 133* 126* 119* 119* 123* 121* 127*   20 pound weight gain in last 1 year -Patient states that for the last 1 year since he left smoking, she has been gaining weight about 20 pounds.  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  No history of CHF.  Last echo from 9/24 showed EF of 60 to 65%, no wall motion abnormality, grade 1 diastolic dysfunction.   -Patient states that in the last 1 year, she has had several rounds of prednisone treatment for breathing.  But she is not on regular oral prednisone. -Last chest x-ray from 9/23 did not show any acute findings.  Pending further today.  Atrial fibrillation, paroxysmal SVT  -Currently in sinus rhythm -Continue Eliquis and metoprolol    History of TIA/CVA -Continue Eliquis and Crestor   Essential hypertension  -Continue metoprolol, losartan remains on hold at this time  Chronic pain  -Continue home meds -Neurontin, tramadol   Anxiety, insomnia  -Continue home regimen  -Soma, Klonopin, Sonata   Chronic bronchitis  -Stable on admission  -Continue ICS/LABA and as-needed albuterol    Constipation -Continue Linzess which she takes about 2 times  a week   Mobility: Encourage ambulation Code Status:   Code Status: Full Code  Nutritional status: Body mass index is 28.52 kg/m.     Diet:  Diet Order             Diet Heart Room service appropriate? Yes; Fluid consistency: Thin; Fluid restriction: 1500 mL Fluid  Diet effective now                  DVT prophylaxis:   apixaban (ELIQUIS) tablet 5 mg   Antimicrobials: None Fluid: 3% saline as of now on  hold Consultants: Nephrology Family Communication: Family not at bedside this morning   Status is: Inpatient  Remains inpatient appropriate because: Needs correction of sodium level  Dispo: The patient is from: Home              Anticipated d/c is to: Home in 1 to 2 days              Patient currently is not medically stable to d/c.   Difficult to place patient No     Infusions:     Scheduled Meds:  apixaban  5 mg Oral BID   carisoprodol  350 mg Oral TID   Chlorhexidine Gluconate Cloth  6 each Topical Daily   clonazePAM  1 mg Oral QHS   gabapentin  600 mg Oral BID WC   linaclotide  145 mcg Oral q1600   metoprolol tartrate  50 mg Oral BID   mometasone-formoterol  2 puff Inhalation BID   pantoprazole  40 mg Oral Daily   rosuvastatin  5 mg Oral Once per day on Mon Wed Fri    Antimicrobials: Anti-infectives (From admission, onward)    None       PRN meds: acetaminophen **OR** acetaminophen, albuterol, hyoscyamine, ondansetron **OR** ondansetron (ZOFRAN) IV, polyethylene glycol, polyvinyl alcohol, traMADol, zolpidem   Objective: Vitals:   10/15/20 0744 10/15/20 0803  BP:    Pulse:    Resp:    Temp:  (!) 97.2 F (36.2 C)  SpO2: 99%     Intake/Output Summary (Last 24 hours) at 10/15/2020 0927 Last data filed at 10/15/2020 0726 Gross per 24 hour  Intake 1046.54 ml  Output 300 ml  Net 746.54 ml   Filed Weights   10/13/20 2048 10/13/20 2137 10/14/20 0500  Weight: 82.6 kg 79.4 kg 82.6 kg   Weight change:  Body mass index is 28.52 kg/m.   Physical Exam: General exam: Pleasant, elderly Caucasian female.  Not in physical distress Skin: No rashes, lesions or ulcers. HEENT: Atraumatic, normocephalic, no obvious bleeding Lungs: Clear to auscultation bilaterally CVS: Regular rate and rhythm, no murmur GI/Abd soft, nontender, nondistended, bowel sound present CNS: Alert, awake, oriented x3 Psychiatry: Mood appropriate Extremities: No pedal edema, no calf  tenderness  Data Review: I have personally reviewed the laboratory data and studies available.  Recent Labs  Lab 10/13/20 2105 10/14/20 0115 10/15/20 0407  WBC 4.9 5.5 4.2  NEUTROABS 3.4  --   --   HGB 14.5 14.1 13.2  HCT 40.4 38.9 36.2  MCV 95.7 95.8 94.3  PLT 286 286 235   Recent Labs  Lab 10/13/20 2105 10/14/20 0115 10/14/20 1036 10/14/20 1207 10/14/20 1542 10/14/20 2003 10/15/20 0013 10/15/20 0407  NA 118*   < > 126* 119* 119* 123* 121* 127*  K 3.6  --  4.0  --   --   --   --  4.3  CL 86*  --  91*  --   --   --   --  97*  CO2 23  --  25  --   --   --   --  24  GLUCOSE 133*  --  119*  --   --   --   --  93  BUN 12  --  8  --   --   --   --  10  CREATININE 0.48  --  0.43*  --   --   --   --  0.55  CALCIUM 8.9  --  9.1  --   --   --   --  8.6*   < > = values in this interval not displayed.    F/u labs ordered Unresulted Labs (From admission, onward)     Start     Ordered   10/15/20 6759  Basic metabolic panel  Daily,   R     Question:  Specimen collection method  Answer:  Lab=Lab collect   10/14/20 0903   10/14/20 1600  Sodium  Now then every 4 hours,   R     Question:  Specimen collection method  Answer:  Lab=Lab collect   10/14/20 1409   10/14/20 0500  CBC  Daily,   R      10/14/20 0259            Signed, Terrilee Croak, MD [facility] 10/15/2020

## 2020-10-15 NOTE — Progress Notes (Signed)
Date and time results received: 10/15/20 1238 (use smartphrase ".now" to insert current time)  Test: Sodium Critical Value: 119  Name of Provider Notified: Dahal, MD  Orders Received? Or Actions Taken?: MD added Tolvaptan

## 2020-10-15 NOTE — Progress Notes (Addendum)
[facility] Progress Note  Subjective: coughing, o/w no new c/o.  4 am Na 127 and 3% stopped, 8 am is 122.   Vitals:   10/15/20 0803 10/15/20 1100 10/15/20 1153 10/15/20 1200  BP:  103/88  (!) 103/37  Pulse:  64  62  Resp:  17  (!) 23  Temp: (!) 97.2 F (36.2 C)  97.8 F (36.6 C)   TempSrc: Oral  Oral   SpO2:  100%  98%  Weight:      Height:        Exam:  alert, nad   no jvd  Chest cta bilat  Cor reg no RG  Abd soft ntnd no ascites   Ext no LE edema   Alert, NF, ox3      UNa 83,  UOsm 755   UA negative   CT head 10/01/31 - negative    TSH 2.0, cortisol 15.1         Assessment/ Plan: Hyponatremia - recurrent, 1st episode was milder 2 wks ago, lowest Na was 125. Here now w/ Na 118.  No signs of heart, liver or renal failure. Normal thyroid and cortisol tests.  Heavy smoker recently quit.  Got 3% saline overnight and was stopped for Na 127 overnight. Will give tolvaptan today 15mg  and f/u labs q 8 hrs. Recommend CT chest/ abd/ pelvis today. Will hold clonazepam and neurontin for now as well. Loosen fluid restriction a bit w/ tolvaptan today.  HTN - cont metoprolol Chronic pain / anxiety - on clonazepam, soma, tramadol, gabapentin     Patricia [name] 10/15/2020, 12:59 PM   Recent Labs  Lab 10/14/20 0115 10/14/20 1036 10/15/20 0407  K  --  4.0 4.3  BUN  --  8 10  CREATININE  --  0.43* 0.55  CALCIUM  --  9.1 8.6*  HGB 14.1  --  13.2   Inpatient medications:  apixaban  5 mg Oral BID   carisoprodol  350 mg Oral TID   Chlorhexidine Gluconate Cloth  6 each Topical Daily   clonazePAM  1 mg Oral QHS   gabapentin  600 mg Oral BID WC   linaclotide  145 mcg Oral q1600   metoprolol tartrate  50 mg Oral BID   mometasone-formoterol  2 puff Inhalation BID   pantoprazole  40 mg Oral Daily   rosuvastatin  5 mg Oral Once per day on Mon Wed Fri   tolvaptan  15 mg Oral Once    acetaminophen **OR** acetaminophen, albuterol, hyoscyamine, iohexol, ondansetron  **OR** ondansetron (ZOFRAN) IV, polyethylene glycol, polyvinyl alcohol, traMADol, zolpidem

## 2020-10-16 ENCOUNTER — Inpatient Hospital Stay (HOSPITAL_COMMUNITY): Payer: Medicare HMO

## 2020-10-16 DIAGNOSIS — R918 Other nonspecific abnormal finding of lung field: Secondary | ICD-10-CM | POA: Diagnosis not present

## 2020-10-16 DIAGNOSIS — E871 Hypo-osmolality and hyponatremia: Secondary | ICD-10-CM | POA: Diagnosis not present

## 2020-10-16 LAB — CBC
HCT: 40.5 % (ref 36.0–46.0)
Hemoglobin: 14.5 g/dL (ref 12.0–15.0)
MCH: 34.5 pg — ABNORMAL HIGH (ref 26.0–34.0)
MCHC: 35.8 g/dL (ref 30.0–36.0)
MCV: 96.4 fL (ref 80.0–100.0)
Platelets: 274 10*3/uL (ref 150–400)
RBC: 4.2 MIL/uL (ref 3.87–5.11)
RDW: 13.5 % (ref 11.5–15.5)
WBC: 4 10*3/uL (ref 4.0–10.5)
nRBC: 0 % (ref 0.0–0.2)

## 2020-10-16 LAB — BASIC METABOLIC PANEL
Anion gap: 7 (ref 5–15)
BUN: 12 mg/dL (ref 8–23)
CO2: 28 mmol/L (ref 22–32)
Calcium: 9.6 mg/dL (ref 8.9–10.3)
Chloride: 97 mmol/L — ABNORMAL LOW (ref 98–111)
Creatinine, Ser: 0.71 mg/dL (ref 0.44–1.00)
GFR, Estimated: >60 mL/min (ref >60)
Glucose, Bld: 107 mg/dL — ABNORMAL HIGH (ref 70–99)
Potassium: 4.2 mmol/L (ref 3.5–5.1)
Sodium: 132 mmol/L — ABNORMAL LOW (ref 135–145)

## 2020-10-16 LAB — SODIUM
Sodium: 131 mmol/L — ABNORMAL LOW (ref 135–145)
Sodium: 136 mmol/L (ref 135–145)

## 2020-10-16 MED ORDER — GADOBUTROL 1 MMOL/ML IV SOLN
7.5000 mL | Freq: Once | INTRAVENOUS | Status: AC | PRN
  Administered 2020-10-16: 7.5 mL via INTRAVENOUS

## 2020-10-16 MED ORDER — FUROSEMIDE 20 MG PO TABS
20.0000 mg | ORAL_TABLET | Freq: Every day | ORAL | Status: DC
  Administered 2020-10-16 – 2020-10-19 (×3): 20 mg via ORAL
  Filled 2020-10-16 (×3): qty 1

## 2020-10-16 MED ORDER — SODIUM CHLORIDE 1 G PO TABS
2.0000 g | ORAL_TABLET | Freq: Two times a day (BID) | ORAL | Status: DC
  Administered 2020-10-16 – 2020-10-19 (×5): 2 g via ORAL
  Filled 2020-10-16 (×8): qty 2

## 2020-10-16 NOTE — Progress Notes (Addendum)
PROGRESS NOTE  Patricia [name]  DOB: Sep 21, 1950  PCP: [name], MD PQZ:[id number]  DOA: 10/13/2020  LOS: 3 days  Hospital Day: 4   Chief Complaint  Patient presents with   Abnormal Lab   Weakness    Brief narrative: Patricia [name] is a 70 y.o. female with PMH significant for paroxysmal atrial fibrillation on Eliquis, history of TIA/CVA, hypertension, anxiety, chronic pain, and hyponatremia during recent admission. Patient presented to the ED on 10/6 with fatigue, nausea, headache and recurrent hyponatremia on outpatient blood work.  She had a sodium level of 135 on the day of discharge on 10/05/2020, repeat sodium level on 10/6 was very low at 118 and hence directed to the ED. In her recent hospitalization from 9/23 through 9/28, patient had a low sodium level of 126 which further dropped down to 225 with IV fluid.  Nephrology was consulted.  Patient was initiated on fluid restriction and was also given a dose of tolvaptan.  Sodium level at discharge was 135.  In the ED, patient was afebrile, blood pressure elevated 163/99, breathing on room air Recheck of sodium level showed 119, serum osmolality low at 255, urine osmolality elevated to 755.  Labs otherwise unremarkable Admitted to hospitalist service and started on 3% saline See below for details  Subjective: Patient was seen and examined this morning.   Emotional, tearful with a new finding of possible bronchogenic carcinoma in her CT chest.  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  Euvolemic on exam. -Work-up revealed progressive carcinoma, likely cause. -Patient is currently followed by nephrology and is on 3% saline.  Sodium level being monitored.  132 this morning. -This morning, nephrology has started the patient on p.o. salt tabs for a week, Lasix daily  and 1000 mL fluid restriction -Continue to monitor labs. Recent Labs  Lab 10/14/20 1207 10/14/20 1542 10/14/20 2003 10/15/20 0013 10/15/20 0407 10/15/20 0840 10/15/20 1200 10/15/20 1259 10/15/20 2122 10/16/20 0450  NA 119* 119* 123* 121* 127* 122* 119* 119* 129* 132*   New diagnosis of bronchogenic carcinoma -50 years of smoking, quitting 4 years ago.  He has had few visits in last 1 year for COPD flareup.  Multiple chest x-rays in the interval remained traditionally unremarkable.  However CT chest on 10/8 identified a 3.8 cm central left lower lobe mass, left hilar and subcarinal lymphadenopathy consistent with primary lung cancer. -Pulmonary consult with Dr. Lamonte Sakai appreciated.  Noted a plan for robotic assisted navigational bronchoscopy, EBUS on Tuesday 10/11 at [facility]  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  Noted the plan to obtain MRI brain. -Patient was previously on Eliquis.  We will keep it on hold at this time.  Last dose last night.  Paroxysmal atrial fibrillation -Currently in sinus rhythm on metoprolol. -Previously on Eliquis.  We will keep it on hold at this time because of the plan of bronchoscopy on Tuesday 10/11.   History of TIA/CVA -Continue Crestor.  Eliquis on hold.   Essential hypertension  -Continue metoprolol.  Losartan remains on hold at this time  Chronic pain  -Continue home meds -Neurontin, tramadol   Anxiety, insomnia  -Continue home regimen  -Soma, Klonopin, Sonata   Chronic bronchitis  -Stable on admission  -Continue ICS/LABA and as-needed albuterol    Constipation -Continue Linzess which she takes about 2 times a week  Mobility: Encourage ambulation Code Status:   Code Status: Full Code  Nutritional status: Body mass index is 28.52 kg/m.     Diet:  Diet Order             Diet regular Room service appropriate? Yes; Fluid consistency: Thin; Fluid restriction: Other (see comments)  Diet effective now                   DVT prophylaxis: Eliquis kept on hold.  SCDs for now   Antimicrobials: None Fluid:  Consultants: Nephrology Family Communication: Family not at bedside this morning   Status is: Inpatient  Remains inpatient appropriate because: Pending bronchoscopy on Tuesday  Dispo: The patient is from: Home              Anticipated d/c is to: Pending bronchoscopy on Tuesday, home after that hopefully              Patient currently is not medically stable to d/c.   Difficult to place patient No     Infusions:   dextrose 50 mL/hr at 10/16/20 0700     Scheduled Meds:  carisoprodol  350 mg Oral TID   Chlorhexidine Gluconate Cloth  6 each Topical Daily   clonazePAM  1 mg Oral QHS   furosemide  20 mg Oral Daily   gabapentin  600 mg Oral BID   linaclotide  145 mcg Oral q1600   metoprolol tartrate  25 mg Oral BID   mometasone-formoterol  2 puff Inhalation BID   rosuvastatin  5 mg Oral Once per day on Mon Wed Fri   sodium chloride  2 g Oral BID WC    Antimicrobials: Anti-infectives (From admission, onward)    None       PRN meds: acetaminophen **OR** acetaminophen, albuterol, hyoscyamine, ondansetron **OR** ondansetron (ZOFRAN) IV, polyethylene glycol, polyvinyl alcohol, traMADol, zolpidem   Objective: Vitals:   10/16/20 1000 10/16/20 1005  BP: (!) 161/108 (!) 142/75  Pulse: 90 93  Resp: (!) 23 14  Temp:    SpO2: 97% 97%    Intake/Output Summary (Last 24 hours) at 10/16/2020 1053 Last data filed at 10/16/2020 0700 Gross per 24 hour  Intake 1733.99 ml  Output 4500 ml  Net -2766.01 ml   Filed Weights   10/13/20 2048 10/13/20 2137 10/14/20 0500  Weight: 82.6 kg 79.4 kg 82.6 kg   Weight change:  Body mass index is 28.52 kg/m.   Physical Exam: General exam: Pleasant, elderly Caucasian female.  Not in physical distress Skin: No rashes, lesions or ulcers. HEENT: Atraumatic, normocephalic, no obvious bleeding Lungs: Clear to auscultation bilaterally CVS: Regular  rate and rhythm, no murmur GI/Abd soft, nontender, nondistended, bowel sound present CNS: Alert, awake, oriented x3 Psychiatry: Emotional, tearful Extremities: No pedal edema, no calf tenderness  Data Review: I have personally reviewed the laboratory data and studies available.  Recent Labs  Lab 10/13/20 2105 10/14/20 0115 10/15/20 0407 10/16/20 0450  WBC 4.9 5.5 4.2 4.0  NEUTROABS 3.4  --   --   --   HGB 14.5 14.1 13.2 14.5  HCT 40.4 38.9 36.2 40.5  MCV 95.7 95.8 94.3 96.4  PLT 286 286 235 274   Recent Labs  Lab 10/13/20 2105 10/14/20 0115 10/14/20 1036 10/14/20 1207 10/15/20 0407 10/15/20 0840 10/15/20 1200 10/15/20 1259 10/15/20 2122 10/16/20 0450  NA 118*   < > 126*   < > 127* 122* 119* 119* 129* 132*  K 3.6  --  4.0  --  4.3  --   --  4.3  --  4.2  CL 86*  --  91*  --  97*  --   --  88*  --  97*  CO2 23  --  25  --  24  --   --  26  --  28  GLUCOSE 133*  --  119*  --  93  --   --  83  --  107*  BUN 12  --  8  --  10  --   --  8  --  12  CREATININE 0.48  --  0.43*  --  0.55  --   --  0.53  --  0.71  CALCIUM 8.9  --  9.1  --  8.6*  --   --  8.5*  --  9.6   < > = values in this interval not displayed.    F/u labs ordered Unresulted Labs (From admission, onward)     Start     Ordered   10/15/20 4174  Basic metabolic panel  Daily,   R     Question:  Specimen collection method  Answer:  Lab=Lab collect   10/14/20 0814            Signed, Terrilee Croak, MD [facility] 10/16/2020

## 2020-10-16 NOTE — Consult Note (Signed)
NAME:  Patricia [name], MRN:  [id number], DOB:  [birth year], LOS: 3 ADMISSION DATE:  10/13/2020, CONSULTATION DATE:  10/16/20 REFERRING MD:  Dr Pietro Cassis, CHIEF COMPLAINT:  Lung mass   History of Present Illness:  70 year old former smoker (50 pack years) with a history of hypertension, paroxysmal A. fib, IBS, GERD.  She has been seen in pulmonary office this year for chronic cough, chest x-ray on 09/30/2020 read as normal.  Has been admitted twice in the last month for hyponatremia with associated nausea, headache, fatigue and malaise. CT chest abdomen pelvis done on 10/8 identified a 3.8 cm central left lower lobe mass, left hilar and subcarinal lymphadenopathy consistent with primary lung cancer.  PCCM consulted to assist with tissue diagnosis.  Pertinent  Medical History   Past Medical History:  Diagnosis Date   Anxiety    GERD (gastroesophageal reflux disease)    Hyperlipidemia    Hypertension    IBS (irritable bowel syndrome)    Insomnia    Low back pain    TIA (transient ischemic attack) 10/2017   no deficits   Vitamin B12 deficiency    Vitamin D deficiency     Significant Hospital Events: Including procedures, antibiotic start and stop dates in addition to other pertinent events   CT chest abdomen pelvis 10/15/2020 >> irregular solid 3.8 cm central left lower lobe mass, left infrahilar and subcarinal lymphadenopathy.  Question cirrhotic change.  No intra-abdominal mass  Interim History / Subjective:  Feeling better, less nausea, less headache with correction of sodium  Objective   Blood pressure (!) 146/73, pulse 90, temperature 97.9 F (36.6 C), temperature source Oral, resp. rate (!) 25, height 5\' 7"  (1.702 m), weight 82.6 kg, SpO2 100 %.        Intake/Output Summary (Last 24 hours) at 10/16/2020 1019 Last data filed at 10/16/2020 0700 Gross per 24 hour  Intake 1733.99 ml  Output 4500 ml  Net -2766.01 ml   Filed Weights   10/13/20 2048 10/13/20 2137 10/14/20 0500   Weight: 82.6 kg 79.4 kg 82.6 kg    Examination: General: Comfortable woman sitting up in bed HENT: Oropharynx clear, pupils equal.  Strong voice, no stridor Lungs: Clear bilaterally, no wheezes or crackles Cardiovascular: Regular, no murmur, NSR on monitor Abdomen: Nondistended, positive bowel sounds Extremities: No edema Neuro: Awake, alert, interacting appropriately, nonfocal  Resolved Hospital Problem list   Hypernatremia  Assessment & Plan:  Left lower lobe mass with associated hilar and subcarinal lymphadenopathy.  Discussed options for possible tissue diagnosis with her.  I believe that bronchoscopy with navigation to her left lower lobe mass would be highest yield.  Her subcarinal and hilar adenopathy would probably be accessible by EBUS, but I am concerned diagnostic yield may be decreased as there may be necrotic change based on appearance of these nodes on CT chest.  I have recommended that we proceed with robotic assisted navigational bronchoscopy, EBUS.  Goal will be to set this up with Dr. Valeta Harms on Tuesday 10/11 at [facility].  Please continue to hold her DOAC.  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   Labs   CBC: Recent Labs  Lab 10/13/20 2105 10/14/20 0115 10/15/20 0407 10/16/20 0450  WBC 4.9 5.5 4.2 4.0  NEUTROABS 3.4  --   --   --   HGB 14.5 14.1 13.2 14.5  HCT 40.4 38.9 36.2 40.5  MCV 95.7 95.8 94.3 96.4  PLT  286 286 235 010    Basic Metabolic Panel: Recent Labs  Lab 10/13/20 2105 10/14/20 0115 10/14/20 1036 10/14/20 1207 10/15/20 0407 10/15/20 0840 10/15/20 1200 10/15/20 1259 10/15/20 2122 10/16/20 0450  NA 118*   < > 126*   < > 127* 122* 119* 119* 129* 132*  K 3.6  --  4.0  --  4.3  --   --  4.3  --  4.2  CL 86*  --  91*  --  97*  --   --  88*  --  97*  CO2 23  --  25  --  24  --   --  26  --  28  GLUCOSE 133*  --  119*  --  93  --   --  83  --  107*  BUN 12  --  8  --  10  --   --  8   --  12  CREATININE 0.48  --  0.43*  --  0.55  --   --  0.53  --  0.71  CALCIUM 8.9  --  9.1  --  8.6*  --   --  8.5*  --  9.6   < > = values in this interval not displayed.   GFR: Estimated Creatinine Clearance: 71.3 mL/min (by C-G formula based on SCr of 0.71 mg/dL). Recent Labs  Lab 10/13/20 2105 10/14/20 0115 10/15/20 0407 10/16/20 0450  WBC 4.9 5.5 4.2 4.0    Liver Function Tests: Recent Labs  Lab 10/13/20 2105 10/15/20 1259  AST 20 16  ALT 23 17  ALKPHOS 76 68  BILITOT 0.6 0.3  PROT 6.9 6.3*  ALBUMIN 4.1 3.9   No results for input(s): LIPASE, AMYLASE in the last 168 hours. No results for input(s): AMMONIA in the last 168 hours.  ABG    Component Value Date/Time   TCO2 21 11/07/2008 2249     Coagulation Profile: No results for input(s): INR, PROTIME in the last 168 hours.  Cardiac Enzymes: No results for input(s): CKTOTAL, CKMB, CKMBINDEX, TROPONINI in the last 168 hours.  HbA1C: No results found for: HGBA1C  CBG: No results for input(s): GLUCAP in the last 168 hours.  Review of Systems:   As per HPI  Past Medical History:  She,  has a past medical history of Anxiety, GERD (gastroesophageal reflux disease), Hyperlipidemia, Hypertension, IBS (irritable bowel syndrome), Insomnia, Low back pain, TIA (transient ischemic attack) (10/2017), Vitamin B12 deficiency, and Vitamin D deficiency.   Surgical History:   Past Surgical History:  Procedure Laterality Date   BACK SURGERY     BREAST EXCISIONAL BIOPSY Left 1997   benign   hemorrhoidecotmy     HERNIA MESH REMOVAL     OPEN REDUCTION INTERNAL FIXATION (ORIF) DISTAL RADIAL FRACTURE Right 11/19/2019   Procedure: OPEN REDUCTION INTERNAL FIXATION (ORIF) DISTAL RADIUS AND ULNA FRACTURE WITH REPAIR AS NECESSARY;  Surgeon: Roseanne Kaufman, MD;  Location: [place];  Service: Orthopedics;  Laterality: Right;  2 hrs Block with IV sedation   ORIF RADIUS & ULNA FRACTURES     TONSILLECTOMY       Social History:    reports that she quit smoking about 10 months ago. Her smoking use included cigarettes. She started smoking about 50 years ago. She has a 50.00 pack-year smoking history. She has never used smokeless tobacco. She reports current alcohol use. She reports that she does not use drugs.   Family History:  Her family history includes  Breast cancer in her maternal aunt, paternal aunt, and sister.   Allergies Allergies  Allergen Reactions   Augmentin [Amoxicillin-Pot Clavulanate] Nausea Only and Other (See Comments)    GI upset   Levaquin [Levofloxacin In D5w] Other (See Comments)    Joint problems    Mom [Magnesium Hydroxide] Other (See Comments)    Welts    Oxycodone Other (See Comments)    Nausea      Home Medications  Prior to Admission medications   Medication Sig Start Date End Date Taking? Authorizing Provider  acetaminophen (TYLENOL) 325 MG tablet 1 tablet as needed.   Yes [provider]  Black Cohosh (REMIFEMIN PO) Take 1 tablet by mouth in the morning and at bedtime.   Yes [provider]  budesonide-formoterol (SYMBICORT) 80-4.5 MCG/ACT inhaler Take 2 puffs first thing in am and then another 2 puffs about 12 hours later. 08/11/20  Yes Tanda Rockers, MD  carboxymethylcellulose (REFRESH PLUS) 0.5 % SOLN Place 1 drop into both eyes 3 (three) times daily as needed (for dryness).    Yes [provider]  carisoprodol (SOMA) 350 MG tablet Take 350 mg by mouth 3 (three) times daily.  09/26/17  Yes [provider]  Cholecalciferol (VITAMIN D3) 50 MCG (2000 UT) TABS Take 2,000 Units by mouth daily.   Yes [provider]  clobetasol ointment (TEMOVATE) 2.63 % Apply 1 application topically See admin instructions. Apply to vaginal area daily as directed   Yes [provider]  clonazePAM (KLONOPIN) 1 MG tablet Take 1 mg by mouth at bedtime.  10/15/17  Yes [provider]  ELIQUIS 5 MG TABS tablet TAKE 1 TABLET(5 MG) BY MOUTH TWICE  DAILY 10/03/20  Yes Camnitz, Will Hassell Done, MD  fluticasone (FLONASE) 50 MCG/ACT nasal spray Place 2 sprays into both nostrils daily as needed for allergies or rhinitis.  10/11/17  Yes [provider]  gabapentin (NEURONTIN) 600 MG tablet Take 600 mg by mouth See admin instructions. Take 600 mg by mouth in the morning and at lunchtime 09/16/17  Yes [provider]  hyoscyamine (LEVSIN) 0.125 MG tablet Take 0.125 mg by mouth as needed. 02/06/10  Yes [provider]  linaclotide Rolan Lipa) 145 MCG CAPS capsule Take 1 capsule (145 mcg total) by mouth daily at 4 PM. 10/03/20  Yes Rizwan, Eunice Blase, MD  losartan (COZAAR) 100 MG tablet Take 100 mg by mouth daily.  08/27/17  Yes [provider]  metoprolol tartrate (LOPRESSOR) 50 MG tablet Take 1 tablet (50 mg total) by mouth 2 (two) times daily. 10/02/20  Yes Debbe Odea, MD  omeprazole (PRILOSEC) 20 MG capsule Take 20 mg by mouth daily before breakfast.  08/27/17  Yes [provider]  ondansetron (ZOFRAN) 4 MG tablet Take 1 tablet (4 mg total) by mouth every 8 (eight) hours as needed for nausea or vomiting. 11/17/19 11/16/20 Yes Roseanne Kaufman, MD  ondansetron (ZOFRAN-ODT) 8 MG disintegrating tablet Take 8 mg by mouth 3 (three) times daily. 10/13/20  Yes [provider]  polyethylene glycol (MIRALAX / GLYCOLAX) packet Take 34 g by mouth in the morning.    Yes [provider]  rosuvastatin (CRESTOR) 5 MG tablet Take 5 mg by mouth 3 (three) times a week. Mondays Wednesdays Fridays   Yes [provider]  traMADol (ULTRAM) 50 MG tablet Take 100 mg by mouth as needed. 10/08/19  Yes [provider]  VENTOLIN HFA 108 (90 Base) MCG/ACT inhaler Inhale 2 puffs into the lungs every  6 (six) hours as needed for wheezing or shortness of breath.  09/30/17  Yes [provider]  zaleplon (SONATA) 10 MG capsule Take 10 mg by mouth at bedtime as needed (for interrupted sleep).    Yes [provider]  hydrOXYzine (ATARAX/VISTARIL) 10 MG tablet Take 10 mg by mouth at bedtime as needed (for sleep). Patient not taking: Reported on 10/13/2020    [provider]     Critical care time: NA     Baltazar Apo, MD, PhD 10/16/2020, 10:29 AM [facility] [phone] or if no answer before 7:00PM call [phone] For any issues after 7:00PM please call eLink [phone]

## 2020-10-16 NOTE — Progress Notes (Signed)
[facility] Progress Note  Subjective: Na 132 this morning, no new c/o's.  CT showing L perihilar lung mass w/ some LN's involved on the same side.    Vitals:   10/16/20 0800 10/16/20 0801 10/16/20 0815 10/16/20 0835  BP:   (!) 150/124 (!) 146/73  Pulse: 82  92 90  Resp: 20  20 (!) 25  Temp:  97.9 F (36.6 C)    TempSrc:  Oral    SpO2: 100%  97% 100%  Weight:      Height:        Exam:  alert, nad   no jvd  Chest cta bilat  Cor reg no RG  Abd soft ntnd no ascites   Ext no LE edema   Alert, NF, ox3      UNa 83,  UOsm 755   UA negative   CT head 10/01/31 - negative    TSH 2.0, cortisol 15.1         Assessment/ Plan: Hyponatremia - recurrent, 1st episode was milder 2 wks ago w/ lowest Na 125.Marland Kitchen Here now w/ Na 118.  No heart, liver or renal failure. Normal thyroid and cortisol tests.  Heavy smoker recently quit.  Got 3% saline initially, then tolvaptan x 1 on 10/8. Na 132 today. 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 Very high Uosm in 700's. Have ordered 1 week of po salt tabs and once daily lasix,  also continue 1000 fluid restriction, hopefully Na+ levels will stabilize.  Rx'ing the tumor will be the primary rx for hyponatremia. Will be available if needed please call.  L perihilar lung mass - by CT. Per primary, pulm team is aware and plan for bronch this week to get tissue diagnosis. HTN - cont metoprolol Chronic pain / anxiety - on clonazepam, soma, tramadol, gabapentin. Continue     Rob Patricia [name] 10/16/2020, 10:17 AM   Recent Labs  Lab 10/15/20 0407 10/15/20 1259 10/16/20 0450  K 4.3 4.3 4.2  BUN 10 8 12   CREATININE 0.55 0.53 0.71  CALCIUM 8.6* 8.5* 9.6  HGB 13.2  --  14.5    Inpatient medications:  carisoprodol  350 mg Oral TID   Chlorhexidine Gluconate Cloth  6 each Topical Daily   clonazePAM  1 mg Oral QHS   gabapentin  600 mg Oral BID   linaclotide  145 mcg Oral q1600   metoprolol tartrate  25 mg Oral BID    mometasone-formoterol  2 puff Inhalation BID   rosuvastatin  5 mg Oral Once per day on Mon Wed Fri    dextrose 50 mL/hr at 10/16/20 0700   acetaminophen **OR** acetaminophen, albuterol, hyoscyamine, ondansetron **OR** ondansetron (ZOFRAN) IV, polyethylene glycol, polyvinyl alcohol, traMADol, zolpidem

## 2020-10-17 DIAGNOSIS — E871 Hypo-osmolality and hyponatremia: Secondary | ICD-10-CM | POA: Diagnosis not present

## 2020-10-17 LAB — BASIC METABOLIC PANEL
Anion gap: 7 (ref 5–15)
BUN: 12 mg/dL (ref 8–23)
CO2: 29 mmol/L (ref 22–32)
Calcium: 9.3 mg/dL (ref 8.9–10.3)
Chloride: 100 mmol/L (ref 98–111)
Creatinine, Ser: 0.67 mg/dL (ref 0.44–1.00)
GFR, Estimated: >60 mL/min (ref >60)
Glucose, Bld: 95 mg/dL (ref 70–99)
Potassium: 4 mmol/L (ref 3.5–5.1)
Sodium: 136 mmol/L (ref 135–145)

## 2020-10-17 MED ORDER — METOPROLOL TARTRATE 25 MG PO TABS
25.0000 mg | ORAL_TABLET | Freq: Once | ORAL | Status: AC
  Administered 2020-10-17: 25 mg via ORAL
  Filled 2020-10-17: qty 1

## 2020-10-17 MED ORDER — METOPROLOL TARTRATE 50 MG PO TABS
50.0000 mg | ORAL_TABLET | Freq: Two times a day (BID) | ORAL | Status: DC
  Administered 2020-10-17 – 2020-10-19 (×4): 50 mg via ORAL
  Filled 2020-10-17: qty 4
  Filled 2020-10-17: qty 2
  Filled 2020-10-17: qty 1
  Filled 2020-10-17: qty 2
  Filled 2020-10-17 (×2): qty 1

## 2020-10-17 NOTE — TOC Initial Note (Signed)
Transition of Care [facility]) - Initial/Assessment Note    Patient Details  Name: Patricia [name] MRN: [id number] Date of Birth: [birth year]  Transition of [facility]) CM/SW Contact:    Leeroy Cha, RN Phone Number: 10/17/2020, 1:05 PM  Clinical Narrative:                 Plan of care: to return to home with self care.  Expected Discharge Plan: Home/Self Care Barriers to Discharge: No Barriers Identified   Patient Goals and CMS Choice Patient states their goals for this hospitalization and ongoing recovery are:: to go back home CMS Medicare.gov Compare Post Acute Care list provided to:: Patient    Expected Discharge Plan and Services Expected Discharge Plan: Home/Self Care   Discharge Planning Services: CM Consult   Living arrangements for the past 2 months: Single Family Home                                      Prior Living Arrangements/Services Living arrangements for the past 2 months: Single Family Home Lives with:: Spouse Patient language and need for interpreter reviewed:: Yes Do you feel safe going back to the place where you live?: Yes            Criminal Activity/Legal Involvement Pertinent to Current Situation/Hospitalization: No - Comment as needed  Activities of Daily Living Home Assistive Devices/Equipment: Eyeglasses ADL Screening (condition at time of admission) Patient's cognitive ability adequate to safely complete daily activities?: Yes Is the patient deaf or have difficulty hearing?: No Does the patient have difficulty seeing, even when wearing glasses/contacts?: No Does the patient have difficulty concentrating, remembering, or making decisions?: No Patient able to express need for assistance with ADLs?: Yes Does the patient have difficulty dressing or bathing?: No Independently performs ADLs?: Yes (appropriate for developmental age) Does the patient have difficulty walking or climbing stairs?: No Weakness of Legs: None Weakness of  Arms/Hands: None  Permission Sought/Granted                  Emotional Assessment Appearance:: Appears stated age     Orientation: : Oriented to Self, Oriented to Place, Oriented to  Time, Oriented to Situation Alcohol / Substance Use: Not Applicable Psych Involvement: No (comment)  Admission diagnosis:  Hyponatremia [E87.1] Patient Active Problem List   Diagnosis Date Noted   Anxiety    AF (paroxysmal atrial fibrillation) (HCC)    Paroxysmal SVT (supraventricular tachycardia) ([place])    Chronic pain    Hyponatremia 10/01/2020   Essential hypertension 10/01/2020   Syncope 09/30/2020   Asthmatic bronchitis 08/11/2020   Colles' fracture of right radius, initial encounter for closed fracture 11/19/2019   PCP:  [name], MD Pharmacy:   [facility] [place], [address] [place] [address] Phone: [phone] Fax: [phone]  [id number] - [place], [place] - [address] [place] [address] Phone: [phone] Fax: [phone]  [facility] - [address] [place] [address] Phone: [phone] Fax: [phone]     Social Determinants of Health (SDOH) Interventions    Readmission Risk Interventions Readmission Risk Prevention Plan 10/17/2020  Post Dischage Appt Complete  Medication Screening Complete  Transportation Screening Complete  Some recent data might be  hidden

## 2020-10-17 NOTE — Plan of Care (Signed)

## 2020-10-17 NOTE — Progress Notes (Signed)
NAME:  Patricia [name], MRN:  [id number], DOB:  February 21, 1950, LOS: 4 ADMISSION DATE:  10/13/2020, CONSULTATION DATE:  10/16/20 REFERRING MD:  Dr Fletcher Anon, CHIEF COMPLAINT:  lung mass   History of Present Illness:  70 year old former smoker (50 pack years) with a history of hypertension, paroxysmal A. fib, IBS, GERD.  She has been seen in pulmonary office this year for chronic cough, chest x-ray on 09/30/2020 read as normal.  Has been admitted twice in the last month for hyponatremia with associated nausea, headache, fatigue and malaise. CT chest abdomen pelvis done on 10/8 identified a 3.8 cm central left lower lobe mass, left hilar and subcarinal lymphadenopathy consistent with primary lung cancer.  PCCM consulted to assist with tissue diagnosis.  Pertinent  Medical History   Past Medical History:  Diagnosis Date   Anxiety    GERD (gastroesophageal reflux disease)    Hyperlipidemia    Hypertension    IBS (irritable bowel syndrome)    Insomnia    Low back pain    TIA (transient ischemic attack) 10/2017   no deficits   Vitamin B12 deficiency    Vitamin D deficiency    Significant Hospital Events: Including procedures, antibiotic start and stop dates in addition to other pertinent events   CT chest abdomen pelvis 10/15/2020 >> irregular solid 3.8 cm central left lower lobe mass, left infrahilar and subcarinal lymphadenopathy.  Question cirrhotic change.  No intra-abdominal mass  Interim History / Subjective:  No overnight events, no chest pain or chest discomfort No lightheadedness  Objective   Blood pressure (!) 193/173, pulse 80, temperature 98.2 F (36.8 C), temperature source Oral, resp. rate (!) 22, height 5\' 7"  (1.702 m), weight 83 kg, SpO2 97 %.        Intake/Output Summary (Last 24 hours) at 10/17/2020 0849 Last data filed at 10/17/2020 0656 Gross per 24 hour  Intake 1098.25 ml  Output 900 ml  Net 198.25 ml   Filed Weights   10/13/20 2137 10/14/20 0500 10/17/20 0500   Weight: 79.4 kg 82.6 kg 83 kg    Examination: General: Elderly lady, does appear comfortable HENT: Moist oral mucosa Lungs: Clear breath sounds bilaterally Cardiovascular: S1-S2 appreciated Abdomen: Bowel sounds appreciated Extremities: No edema, no clubbing Neuro: Alert and oriented x3 GU:   Reviewed CT scan of the chest 10/15/2020 myself -Left medial left lower lobe lung mass, subcarinal adenopathy, hilar adenopathy  Resolved Hospital Problem list   Hyponatremia  Assessment & Plan:  Patient with a left lower lobe mass with associated subcarinal and hilar adenopathy -Will require bronchoscopy -Schedule for bronchoscopy 10/18/2020  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 -Continue bronchodilator treatments  Paroxysmal atrial fibrillation  Hypertension  Hyponatremia -This is corrected -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     Labs   CBC: Recent Labs  Lab 10/13/20 2105 10/14/20 0115 10/15/20 0407 10/16/20 0450  WBC 4.9 5.5 4.2 4.0  NEUTROABS 3.4  --   --   --   HGB 14.5 14.1 13.2 14.5  HCT 40.4 38.9 36.2 40.5  MCV 95.7 95.8 94.3 96.4  PLT 286 286 235 656    Basic Metabolic Panel: Recent Labs  Lab 10/14/20 1036 10/14/20 1207 10/15/20 0407 10/15/20 0840 10/15/20 1259 10/15/20 2122 10/16/20 0450 10/16/20 1630 10/16/20 2255 10/17/20 0608  NA 126*   < > 127*   < > 119* 129* 132* 131* 136 136  K 4.0  --  4.3  --  4.3  --  4.2  --   --  4.0  CL 91*  --  97*  --  88*  --  97*  --   --  100  CO2 25  --  24  --  26  --  28  --   --  29  GLUCOSE 119*  --  93  --  83  --  107*  --   --  95  BUN 8  --  10  --  8  --  12  --   --  12  CREATININE 0.43*  --  0.55  --  0.53  --  0.71  --   --  0.67  CALCIUM 9.1  --  8.6*  --  8.5*  --  9.6  --   --  9.3   < > = values in this interval not displayed.   GFR: Estimated Creatinine Clearance: 71.5 mL/min (by C-G formula based on SCr of 0.67 mg/dL). Recent Labs  Lab 10/13/20 2105 10/14/20 0115  10/15/20 0407 10/16/20 0450  WBC 4.9 5.5 4.2 4.0    Liver Function Tests: Recent Labs  Lab 10/13/20 2105 10/15/20 1259  AST 20 16  ALT 23 17  ALKPHOS 76 68  BILITOT 0.6 0.3  PROT 6.9 6.3*  ALBUMIN 4.1 3.9   No results for input(s): LIPASE, AMYLASE in the last 168 hours. No results for input(s): AMMONIA in the last 168 hours.  ABG    Component Value Date/Time   TCO2 21 11/07/2008 2249     Coagulation Profile: No results for input(s): INR, PROTIME in the last 168 hours.  Cardiac Enzymes: No results for input(s): CKTOTAL, CKMB, CKMBINDEX, TROPONINI in the last 168 hours.  HbA1C: No results found for: HGBA1C  CBG: No results for input(s): GLUCAP in the last 168 hours.  Review of Systems:   No pain or discomfort at present No headaches, no Lightheadedness  Past Medical History:  She,  has a past medical history of Anxiety, GERD (gastroesophageal reflux disease), Hyperlipidemia, Hypertension, IBS (irritable bowel syndrome), Insomnia, Low back pain, TIA (transient ischemic attack) (10/2017), Vitamin B12 deficiency, and Vitamin D deficiency.   Surgical History:   Past Surgical History:  Procedure Laterality Date   BACK SURGERY     BREAST EXCISIONAL BIOPSY Left 1997   benign   hemorrhoidecotmy     HERNIA MESH REMOVAL     OPEN REDUCTION INTERNAL FIXATION (ORIF) DISTAL RADIAL FRACTURE Right 11/19/2019   Procedure: OPEN REDUCTION INTERNAL FIXATION (ORIF) DISTAL RADIUS AND ULNA FRACTURE WITH REPAIR AS NECESSARY;  Surgeon: Roseanne Kaufman, MD;  Location: [facility];  Service: Orthopedics;  Laterality: Right;  2 hrs Block with IV sedation   ORIF RADIUS & ULNA FRACTURES     TONSILLECTOMY       Social History:   reports that she quit smoking about 10 months ago. Her smoking use included cigarettes. She started smoking about 50 years ago. She has a 50.00 pack-year smoking history. She has never used smokeless tobacco. She reports current alcohol use. She reports that she does  not use drugs.   Family History:  Her family history includes Breast cancer in her maternal aunt, paternal aunt, and sister.    Sherrilyn Rist, MD [place] [facility] Pager: See Shea Evans

## 2020-10-17 NOTE — H&P (View-Only) (Signed)
NAME:  Patricia [name], MRN:  [id number], DOB:  [birth year], LOS: 4 ADMISSION DATE:  10/13/2020, CONSULTATION DATE:  10/16/20 REFERRING MD:  Dr Fletcher Anon, CHIEF COMPLAINT:  lung mass   History of Present Illness:  70 year old former smoker (50 pack years) with a history of hypertension, paroxysmal A. fib, IBS, GERD.  She has been seen in pulmonary office this year for chronic cough, chest x-ray on 09/30/2020 read as normal.  Has been admitted twice in the last month for hyponatremia with associated nausea, headache, fatigue and malaise. CT chest abdomen pelvis done on 10/8 identified a 3.8 cm central left lower lobe mass, left hilar and subcarinal lymphadenopathy consistent with primary lung cancer.  PCCM consulted to assist with tissue diagnosis.  Pertinent  Medical History   Past Medical History:  Diagnosis Date   Anxiety    GERD (gastroesophageal reflux disease)    Hyperlipidemia    Hypertension    IBS (irritable bowel syndrome)    Insomnia    Low back pain    TIA (transient ischemic attack) 10/2017   no deficits   Vitamin B12 deficiency    Vitamin D deficiency    Significant Hospital Events: Including procedures, antibiotic start and stop dates in addition to other pertinent events   CT chest abdomen pelvis 10/15/2020 >> irregular solid 3.8 cm central left lower lobe mass, left infrahilar and subcarinal lymphadenopathy.  Question cirrhotic change.  No intra-abdominal mass  Interim History / Subjective:  No overnight events, no chest pain or chest discomfort No lightheadedness  Objective   Blood pressure (!) 193/173, pulse 80, temperature 98.2 F (36.8 C), temperature source Oral, resp. rate (!) 22, height 5\' 7"  (1.702 m), weight 83 kg, SpO2 97 %.        Intake/Output Summary (Last 24 hours) at 10/17/2020 0849 Last data filed at 10/17/2020 0656 Gross per 24 hour  Intake 1098.25 ml  Output 900 ml  Net 198.25 ml   Filed Weights   10/13/20 2137 10/14/20 0500 10/17/20 0500   Weight: 79.4 kg 82.6 kg 83 kg    Examination: General: Elderly lady, does appear comfortable HENT: Moist oral mucosa Lungs: Clear breath sounds bilaterally Cardiovascular: S1-S2 appreciated Abdomen: Bowel sounds appreciated Extremities: No edema, no clubbing Neuro: Alert and oriented x3 GU:   Reviewed CT scan of the chest 10/15/2020 myself -Left medial left lower lobe lung mass, subcarinal adenopathy, hilar adenopathy  Resolved Hospital Problem list   Hyponatremia  Assessment & Plan:  Patient with a left lower lobe mass with associated subcarinal and hilar adenopathy -Will require bronchoscopy -Schedule for bronchoscopy 10/18/2020  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 -Continue bronchodilator treatments  Paroxysmal atrial fibrillation  Hypertension  Hyponatremia -This is corrected -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     Labs   CBC: Recent Labs  Lab 10/13/20 2105 10/14/20 0115 10/15/20 0407 10/16/20 0450  WBC 4.9 5.5 4.2 4.0  NEUTROABS 3.4  --   --   --   HGB 14.5 14.1 13.2 14.5  HCT 40.4 38.9 36.2 40.5  MCV 95.7 95.8 94.3 96.4  PLT 286 286 235 852    Basic Metabolic Panel: Recent Labs  Lab 10/14/20 1036 10/14/20 1207 10/15/20 0407 10/15/20 0840 10/15/20 1259 10/15/20 2122 10/16/20 0450 10/16/20 1630 10/16/20 2255 10/17/20 0608  NA 126*   < > 127*   < > 119* 129* 132* 131* 136 136  K 4.0  --  4.3  --  4.3  --  4.2  --   --  4.0  CL 91*  --  97*  --  88*  --  97*  --   --  100  CO2 25  --  24  --  26  --  28  --   --  29  GLUCOSE 119*  --  93  --  83  --  107*  --   --  95  BUN 8  --  10  --  8  --  12  --   --  12  CREATININE 0.43*  --  0.55  --  0.53  --  0.71  --   --  0.67  CALCIUM 9.1  --  8.6*  --  8.5*  --  9.6  --   --  9.3   < > = values in this interval not displayed.   GFR: Estimated Creatinine Clearance: 71.5 mL/min (by C-G formula based on SCr of 0.67 mg/dL). Recent Labs  Lab 10/13/20 2105 10/14/20 0115  10/15/20 0407 10/16/20 0450  WBC 4.9 5.5 4.2 4.0    Liver Function Tests: Recent Labs  Lab 10/13/20 2105 10/15/20 1259  AST 20 16  ALT 23 17  ALKPHOS 76 68  BILITOT 0.6 0.3  PROT 6.9 6.3*  ALBUMIN 4.1 3.9   No results for input(s): LIPASE, AMYLASE in the last 168 hours. No results for input(s): AMMONIA in the last 168 hours.  ABG    Component Value Date/Time   TCO2 21 11/07/2008 2249     Coagulation Profile: No results for input(s): INR, PROTIME in the last 168 hours.  Cardiac Enzymes: No results for input(s): CKTOTAL, CKMB, CKMBINDEX, TROPONINI in the last 168 hours.  HbA1C: No results found for: HGBA1C  CBG: No results for input(s): GLUCAP in the last 168 hours.  Review of Systems:   No pain or discomfort at present No headaches, no Lightheadedness  Past Medical History:  She,  has a past medical history of Anxiety, GERD (gastroesophageal reflux disease), Hyperlipidemia, Hypertension, IBS (irritable bowel syndrome), Insomnia, Low back pain, TIA (transient ischemic attack) (10/2017), Vitamin B12 deficiency, and Vitamin D deficiency.   Surgical History:   Past Surgical History:  Procedure Laterality Date   BACK SURGERY     BREAST EXCISIONAL BIOPSY Left 1997   benign   hemorrhoidecotmy     HERNIA MESH REMOVAL     OPEN REDUCTION INTERNAL FIXATION (ORIF) DISTAL RADIAL FRACTURE Right 11/19/2019   Procedure: OPEN REDUCTION INTERNAL FIXATION (ORIF) DISTAL RADIUS AND ULNA FRACTURE WITH REPAIR AS NECESSARY;  Surgeon: Roseanne Kaufman, MD;  Location: [place];  Service: Orthopedics;  Laterality: Right;  2 hrs Block with IV sedation   ORIF RADIUS & ULNA FRACTURES     TONSILLECTOMY       Social History:   reports that she quit smoking about 10 months ago. Her smoking use included cigarettes. She started smoking about 50 years ago. She has a 50.00 pack-year smoking history. She has never used smokeless tobacco. She reports current alcohol use. She reports that she does  not use drugs.   Family History:  Her family history includes Breast cancer in her maternal aunt, paternal aunt, and sister.    Sherrilyn Rist, MD [facility] Pager: See Shea Evans

## 2020-10-17 NOTE — Progress Notes (Signed)
PROGRESS NOTE  Patricia [name]  DOB: [birth year]  PCP: [name], MD ONG:[id number]  DOA: 10/13/2020  LOS: 4 days  Hospital Day: 5   Chief Complaint  Patient presents with   Abnormal Lab   Weakness    Brief narrative: Patricia [name] is a 70 y.o. female with PMH significant for paroxysmal atrial fibrillation on Eliquis, history of TIA/CVA, hypertension, anxiety, chronic pain, and hyponatremia during recent admission. Patient presented to the ED on 10/6 with fatigue, nausea, headache and recurrent hyponatremia on outpatient blood work.  She had a sodium level of 135 on the day of discharge on 10/05/2020, repeat sodium level on 10/6 was very low at 118 and hence directed to the ED. In her recent hospitalization from 9/23 through 9/28, patient had a low sodium level of 126 which further dropped down to 225 with IV fluid.  Nephrology was consulted.  Patient was initiated on fluid restriction and was also given a dose of tolvaptan.  Sodium level at discharge was 135.  In the ED, patient was afebrile, blood pressure elevated 163/99, breathing on room air Recheck of sodium level showed 119, serum osmolality low at 255, urine osmolality elevated to 755.  Labs otherwise unremarkable Admitted to hospitalist service and started on 3% saline See below for details  Subjective: Patient was seen and examined this morning.   Propped up in bed.  Not in distress.  No new symptoms.  Waiting for bronchoscopy tomorrow. Sodium level improved to 136 today.  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  Euvolemic on exam. -Patient was treated with 3% saline. 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  -Patient is currently followed by nephrology and is on 3% saline.  -Per nephrology recommendation.  Currently patient is on  7-day course of 2 g sodium chloride tablets twice daily, Lasix 20 mg oral daily and 1000 mL fluid restriction -Continue to monitor labs. Recent Labs  Lab 10/15/20 0013 10/15/20 0407 10/15/20 0840 10/15/20 1200 10/15/20 1259 10/15/20 2122 10/16/20 0450 10/16/20 1630 10/16/20 2255 10/17/20 0608  NA 121* 127* 122* 119* 119* 129* 132* 131* 136 136   New diagnosis of bronchogenic carcinoma -50 years of smoking, quitting 4 years ago.  She has had few visits in last 1 year for COPD flareup.  Multiple chest x-rays in the interval remained traditionally unremarkable.  However CT chest on 10/8 identified a 3.8 cm central left lower lobe mass, left hilar and subcarinal lymphadenopathy consistent with primary lung cancer. -Pulmonary consult with Dr. Lamonte Sakai appreciated.  Noted a plan for robotic assisted navigational bronchoscopy, EBUS on Tuesday 10/11 at [facility].  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  MRI brain negative for any intracranial metastasis. -Patient was previously on Eliquis.  Currently it is on hold.  Paroxysmal atrial fibrillation -Currently in sinus rhythm on metoprolol. -Eliquis on hold.  History of TIA/CVA -Continue Crestor.  Eliquis on hold.   Essential hypertension  -Continue metoprolol.  Losartan remains on hold at this time  Chronic pain  -Continue home meds -Neurontin, tramadol   Anxiety, insomnia  -Continue home regimen  -Soma, Klonopin, Sonata   Chronic bronchitis  -Stable on admission  -Continue ICS/LABA and as-needed albuterol    Constipation -Continue Linzess which she takes about 2 times a week   Mobility: Encourage ambulation Code Status:   Code Status: Full Code  Nutritional status: Body mass index is 28.66 kg/m.     Diet:  Diet Order             Diet NPO time specified  Diet effective midnight           Diet regular Room service appropriate? Yes; Fluid consistency: Thin; Fluid restriction: Other (see comments)  Diet effective  now                  DVT prophylaxis: Eliquis kept on hold.  SCDs for now   Antimicrobials: None Fluid:  Consultants: Nephrology Family Communication: Family not at bedside this morning   Status is: Inpatient  Remains inpatient appropriate because: Pending bronchoscopy tomorrow 10/11.  Dispo: The patient is from: Home              Anticipated d/c is to: Pending bronchoscopy on Tuesday, home after that hopefully              Patient currently is not medically stable to d/c.   Difficult to place patient No     Infusions:      Scheduled Meds:  carisoprodol  350 mg Oral TID   Chlorhexidine Gluconate Cloth  6 each Topical Daily   clonazePAM  1 mg Oral QHS   furosemide  20 mg Oral Daily   gabapentin  600 mg Oral BID   linaclotide  145 mcg Oral q1600   metoprolol tartrate  25 mg Oral BID   mometasone-formoterol  2 puff Inhalation BID   rosuvastatin  5 mg Oral Once per day on Mon Wed Fri   sodium chloride  2 g Oral BID WC    Antimicrobials: Anti-infectives (From admission, onward)    None       PRN meds: acetaminophen **OR** acetaminophen, albuterol, hyoscyamine, ondansetron **OR** ondansetron (ZOFRAN) IV, polyethylene glycol, polyvinyl alcohol, traMADol, zolpidem   Objective: Vitals:   10/17/20 0736 10/17/20 0800  BP: (!) 143/73 (!) 150/90  Pulse: 78 73  Resp: 16 20  Temp:  98.2 F (36.8 C)  SpO2: 98% 95%    Intake/Output Summary (Last 24 hours) at 10/17/2020 0935 Last data filed at 10/17/2020 0656 Gross per 24 hour  Intake 1098.25 ml  Output 900 ml  Net 198.25 ml   Filed Weights   10/13/20 2137 10/14/20 0500 10/17/20 0500  Weight: 79.4 kg 82.6 kg 83 kg   Weight change:  Body mass index is 28.66 kg/m.   Physical Exam: General exam: Pleasant, elderly Caucasian female.  Not in physical distress Skin: No rashes, lesions or ulcers. HEENT: Atraumatic, normocephalic, no obvious bleeding Lungs: Clear to auscultation bilaterally CVS: Regular  rate and rhythm, no murmur GI/Abd soft, nontender, nondistended, bowel sound present CNS: Alert, awake, oriented x3 Psychiatry: Trying to stay calm. Extremities: No pedal edema, no calf tenderness  Data Review: I have personally reviewed the laboratory data and studies available.  Recent Labs  Lab 10/13/20 2105 10/14/20 0115 10/15/20 0407 10/16/20 0450  WBC 4.9 5.5 4.2 4.0  NEUTROABS 3.4  --   --   --   HGB 14.5 14.1 13.2 14.5  HCT 40.4 38.9 36.2 40.5  MCV 95.7 95.8 94.3 96.4  PLT 286 286 235 274   Recent Labs  Lab 10/14/20 1036 10/14/20 1207 10/15/20 0407 10/15/20 0840 10/15/20 1259 10/15/20 2122 10/16/20 0450 10/16/20 1630 10/16/20 2255 10/17/20 0608  NA 126*   < > 127*   < > 119* 129* 132* 131* 136 136  K 4.0  --  4.3  --  4.3  --  4.2  --   --  4.0  CL 91*  --  97*  --  88*  --  97*  --   --  100  CO2 25  --  24  --  26  --  28  --   --  29  GLUCOSE 119*  --  93  --  83  --  107*  --   --  95  BUN 8  --  10  --  8  --  12  --   --  12  CREATININE 0.43*  --  0.55  --  0.53  --  0.71  --   --  0.67  CALCIUM 9.1  --  8.6*  --  8.5*  --  9.6  --   --  9.3   < > = values in this interval not displayed.    F/u labs ordered Unresulted Labs (From admission, onward)     Start     Ordered   Unscheduled  CBC with Differential/Platelet  Daily,   R     Question:  Specimen collection method  Answer:  Lab=Lab collect   10/17/20 0935   Unscheduled  Basic metabolic panel  Daily,   R     Question:  Specimen collection method  Answer:  Lab=Lab collect   10/17/20 0935   Pending  Sodium  Now then every 8 hours,   R     Question:  Specimen collection method  Answer:  Lab=Lab collect   Pending            Signed, Terrilee Croak, MD [facility] 10/17/2020

## 2020-10-18 ENCOUNTER — Inpatient Hospital Stay (HOSPITAL_COMMUNITY): Payer: Medicare HMO | Admitting: Certified Registered Nurse Anesthetist

## 2020-10-18 ENCOUNTER — Inpatient Hospital Stay (HOSPITAL_COMMUNITY): Payer: Medicare HMO

## 2020-10-18 ENCOUNTER — Encounter (HOSPITAL_COMMUNITY): Payer: Self-pay | Admitting: Family Medicine

## 2020-10-18 ENCOUNTER — Encounter (HOSPITAL_COMMUNITY)
Admission: EM | Disposition: A | Discharge status: Home / Self Care | Payer: Self-pay | Source: Home / Self Care | Attending: Internal Medicine

## 2020-10-18 DIAGNOSIS — R911 Solitary pulmonary nodule: Secondary | ICD-10-CM

## 2020-10-18 DIAGNOSIS — E871 Hypo-osmolality and hyponatremia: Secondary | ICD-10-CM | POA: Diagnosis not present

## 2020-10-18 HISTORY — PX: VIDEO BRONCHOSCOPY WITH ENDOBRONCHIAL ULTRASOUND: SHX6177

## 2020-10-18 HISTORY — PX: BRONCHIAL BIOPSY: SHX5109

## 2020-10-18 HISTORY — PX: VIDEO BRONCHOSCOPY WITH RADIAL ENDOBRONCHIAL ULTRASOUND: SHX6849

## 2020-10-18 HISTORY — PX: VIDEO BRONCHOSCOPY WITH ENDOBRONCHIAL NAVIGATION: SHX6175

## 2020-10-18 HISTORY — PX: BRONCHIAL WASHINGS: SHX5105

## 2020-10-18 HISTORY — PX: BRONCHIAL NEEDLE ASPIRATION BIOPSY: SHX5106

## 2020-10-18 HISTORY — PX: BRONCHIAL BRUSHINGS: SHX5108

## 2020-10-18 LAB — BASIC METABOLIC PANEL
Anion gap: 8 (ref 5–15)
BUN: 13 mg/dL (ref 8–23)
CO2: 27 mmol/L (ref 22–32)
Calcium: 8.6 mg/dL — ABNORMAL LOW (ref 8.9–10.3)
Chloride: 99 mmol/L (ref 98–111)
Creatinine, Ser: 0.57 mg/dL (ref 0.44–1.00)
GFR, Estimated: >60 mL/min (ref >60)
Glucose, Bld: 89 mg/dL (ref 70–99)
Potassium: 3.6 mmol/L (ref 3.5–5.1)
Sodium: 134 mmol/L — ABNORMAL LOW (ref 135–145)

## 2020-10-18 LAB — CBC WITH DIFFERENTIAL/PLATELET
Abs Immature Granulocytes: 0.02 10*3/uL (ref 0.00–0.07)
Basophils Absolute: 0 10*3/uL (ref 0.0–0.1)
Basophils Relative: 1 %
Eosinophils Absolute: 0.1 10*3/uL (ref 0.0–0.5)
Eosinophils Relative: 2 %
HCT: 38.7 % (ref 36.0–46.0)
Hemoglobin: 13.5 g/dL (ref 12.0–15.0)
Immature Granulocytes: 0 %
Lymphocytes Relative: 24 %
Lymphs Abs: 1.2 10*3/uL (ref 0.7–4.0)
MCH: 34.7 pg — ABNORMAL HIGH (ref 26.0–34.0)
MCHC: 34.9 g/dL (ref 30.0–36.0)
MCV: 99.5 fL (ref 80.0–100.0)
Monocytes Absolute: 0.4 10*3/uL (ref 0.1–1.0)
Monocytes Relative: 8 %
Neutro Abs: 3.2 10*3/uL (ref 1.7–7.7)
Neutrophils Relative %: 65 %
Platelets: 256 10*3/uL (ref 150–400)
RBC: 3.89 MIL/uL (ref 3.87–5.11)
RDW: 14.6 % (ref 11.5–15.5)
WBC: 5 10*3/uL (ref 4.0–10.5)
nRBC: 0 % (ref 0.0–0.2)

## 2020-10-18 SURGERY — VIDEO BRONCHOSCOPY WITH ENDOBRONCHIAL NAVIGATION
Anesthesia: General | Laterality: Left

## 2020-10-18 MED ORDER — ROCURONIUM BROMIDE 10 MG/ML (PF) SYRINGE
PREFILLED_SYRINGE | INTRAVENOUS | Status: DC | PRN
  Administered 2020-10-18: 80 mg via INTRAVENOUS

## 2020-10-18 MED ORDER — FENTANYL CITRATE (PF) 250 MCG/5ML IJ SOLN
INTRAMUSCULAR | Status: DC | PRN
  Administered 2020-10-18: 100 ug via INTRAVENOUS

## 2020-10-18 MED ORDER — LACTATED RINGERS IV SOLN: INTRAVENOUS | Status: DC

## 2020-10-18 MED ORDER — PROPOFOL 10 MG/ML IV BOLUS
INTRAVENOUS | Status: DC | PRN
  Administered 2020-10-18: 150 mg via INTRAVENOUS

## 2020-10-18 MED ORDER — PHENYLEPHRINE 40 MCG/ML (10ML) SYRINGE FOR IV PUSH (FOR BLOOD PRESSURE SUPPORT)
PREFILLED_SYRINGE | INTRAVENOUS | Status: DC | PRN
  Administered 2020-10-18: 80 ug via INTRAVENOUS
  Administered 2020-10-18 (×2): 120 ug via INTRAVENOUS
  Administered 2020-10-18: 80 ug via INTRAVENOUS

## 2020-10-18 MED ORDER — SUGAMMADEX SODIUM 200 MG/2ML IV SOLN
INTRAVENOUS | Status: DC | PRN
  Administered 2020-10-18: 200 mg via INTRAVENOUS

## 2020-10-18 MED ORDER — LIDOCAINE 2% (20 MG/ML) 5 ML SYRINGE
INTRAMUSCULAR | Status: DC | PRN
  Administered 2020-10-18: 60 mg via INTRAVENOUS

## 2020-10-18 MED ORDER — METOPROLOL TARTRATE 5 MG/5ML IV SOLN
2.5000 mg | Freq: Four times a day (QID) | INTRAVENOUS | Status: DC | PRN
  Administered 2020-10-18: 2.5 mg via INTRAVENOUS
  Filled 2020-10-18: qty 5

## 2020-10-18 MED ORDER — METOPROLOL TARTRATE 5 MG/5ML IV SOLN
2.5000 mg | INTRAVENOUS | Status: DC | PRN
  Administered 2020-10-18 – 2020-10-19 (×2): 2.5 mg via INTRAVENOUS
  Filled 2020-10-18 (×2): qty 5

## 2020-10-18 MED ORDER — DEXAMETHASONE SODIUM PHOSPHATE 10 MG/ML IJ SOLN
INTRAMUSCULAR | Status: DC | PRN
  Administered 2020-10-18: 8 mg via INTRAVENOUS

## 2020-10-18 MED ORDER — ONDANSETRON HCL 4 MG/2ML IJ SOLN
INTRAMUSCULAR | Status: DC | PRN
  Administered 2020-10-18: 4 mg via INTRAVENOUS

## 2020-10-18 MED ORDER — CHLORHEXIDINE GLUCONATE 0.12 % MT SOLN
OROMUCOSAL | Status: AC
  Administered 2020-10-18: 15 mL via OROMUCOSAL
  Filled 2020-10-18: qty 15

## 2020-10-18 MED ORDER — CHLORHEXIDINE GLUCONATE 0.12 % MT SOLN: 15.0000 mL | OROMUCOSAL | Status: AC

## 2020-10-18 MED ORDER — PHENYLEPHRINE HCL-NACL 20-0.9 MG/250ML-% IV SOLN
INTRAVENOUS | Status: DC | PRN
  Administered 2020-10-18: 30 ug/min via INTRAVENOUS

## 2020-10-18 MED ORDER — MIDAZOLAM HCL 2 MG/2ML IJ SOLN
INTRAMUSCULAR | Status: DC | PRN
  Administered 2020-10-18 (×2): 1 mg via INTRAVENOUS

## 2020-10-18 SURGICAL SUPPLY — 53 items
ADAPTER BRONCH F/PENTAX (ADAPTER) ×4 IMPLANT
ADAPTER VALVE BIOPSY EBUS (MISCELLANEOUS) IMPLANT
ADPR BSCP EDG PNTX (ADAPTER) ×3
ADPTR VALVE BIOPSY EBUS (MISCELLANEOUS)
BRUSH CYTOL CELLEBRITY 1.5X140 (MISCELLANEOUS) ×4 IMPLANT
BRUSH SUPERTRAX BIOPSY (INSTRUMENTS) IMPLANT
BRUSH SUPERTRAX NDL-TIP CYTO (INSTRUMENTS) ×4 IMPLANT
CANISTER SUCT 3000ML PPV (MISCELLANEOUS) ×4 IMPLANT
CHANNEL WORK EXTEND EDGE 180 (KITS) IMPLANT
CHANNEL WORK EXTEND EDGE 45 (KITS) IMPLANT
CHANNEL WORK EXTEND EDGE 90 (KITS) IMPLANT
CONT SPEC 4OZ CLIKSEAL STRL BL (MISCELLANEOUS) ×4 IMPLANT
COVER BACK TABLE 60X90IN (DRAPES) ×4 IMPLANT
COVER DOME SNAP 22 D (MISCELLANEOUS) ×4 IMPLANT
FILTER STRAW FLUID ASPIR (MISCELLANEOUS) IMPLANT
FORCEPS BIOP RJ4 1.8 (CUTTING FORCEPS) IMPLANT
FORCEPS BIOP SUPERTRX PREMAR (INSTRUMENTS) ×4 IMPLANT
GAUZE SPONGE 4X4 12PLY STRL (GAUZE/BANDAGES/DRESSINGS) ×4 IMPLANT
GLOVE BIO SURGEON STRL SZ7.5 (GLOVE) ×4 IMPLANT
GLOVE SURG SS PI 7.5 STRL IVOR (GLOVE) ×8 IMPLANT
GOWN STRL REUS W/ TWL LRG LVL3 (GOWN DISPOSABLE) ×6 IMPLANT
GOWN STRL REUS W/TWL LRG LVL3 (GOWN DISPOSABLE) ×8
KIT CLEAN ENDO COMPLIANCE (KITS) ×8 IMPLANT
KIT LOCATABLE GUIDE (CANNULA) IMPLANT
KIT MARKER FIDUCIAL DELIVERY (KITS) IMPLANT
KIT PROCEDURE EDGE 180 (KITS) IMPLANT
KIT PROCEDURE EDGE 45 (KITS) IMPLANT
KIT PROCEDURE EDGE 90 (KITS) IMPLANT
KIT TURNOVER KIT B (KITS) ×4 IMPLANT
MARKER SKIN DUAL TIP RULER LAB (MISCELLANEOUS) ×4 IMPLANT
NDL EBUS SONO TIP PENTAX (NEEDLE) ×3 IMPLANT
NDL SUPERTRX PREMARK BIOPSY (NEEDLE) ×3 IMPLANT
NEEDLE EBUS SONO TIP PENTAX (NEEDLE) ×4 IMPLANT
NEEDLE SUPERTRX PREMARK BIOPSY (NEEDLE) ×4 IMPLANT
NS IRRIG 1000ML POUR BTL (IV SOLUTION) ×4 IMPLANT
OIL SILICONE PENTAX (PARTS (SERVICE/REPAIRS)) ×4 IMPLANT
PAD ARMBOARD 7.5X6 YLW CONV (MISCELLANEOUS) ×8 IMPLANT
PATCHES PATIENT (LABEL) ×12 IMPLANT
SOL ANTI FOG 6CC (MISCELLANEOUS) ×3 IMPLANT
SOLUTION ANTI FOG 6CC (MISCELLANEOUS) ×1
SYR 20CC LL (SYRINGE) ×8 IMPLANT
SYR 20ML ECCENTRIC (SYRINGE) ×8 IMPLANT
SYR 50ML SLIP (SYRINGE) ×4 IMPLANT
SYR 5ML LUER SLIP (SYRINGE) ×4 IMPLANT
TOWEL OR 17X24 6PK STRL BLUE (TOWEL DISPOSABLE) ×4 IMPLANT
TRAP SPECIMEN MUCOUS 40CC (MISCELLANEOUS) IMPLANT
TUBE CONNECTING 20X1/4 (TUBING) ×8 IMPLANT
UNDERPAD 30X30 (UNDERPADS AND DIAPERS) ×4 IMPLANT
VALVE BIOPSY  SINGLE USE (MISCELLANEOUS) ×4
VALVE BIOPSY SINGLE USE (MISCELLANEOUS) ×3 IMPLANT
VALVE DISPOSABLE (MISCELLANEOUS) ×4 IMPLANT
VALVE SUCTION BRONCHIO DISP (MISCELLANEOUS) ×4 IMPLANT
WATER STERILE IRR 1000ML POUR (IV SOLUTION) ×4 IMPLANT

## 2020-10-18 NOTE — Discharge Instructions (Signed)
Flexible Bronchoscopy, Care After This sheet gives you information about how to care for yourself after your test. Your doctor may also give you more specific instructions. If you have problems or questions, contact your doctor. Follow these instructions at home: Eating and drinking Do not eat or drink anything (not even water) for 2 hours after your test, or until your numbing medicine (local anesthetic) wears off. When your numbness is gone and your cough and gag reflexes have come back, you may: Eat only soft foods. Slowly drink liquids. The day after the test, go back to your normal diet. Driving Do not drive for 24 hours if you were given a medicine to help you relax (sedative). Do not drive or use heavy machinery while taking prescription pain medicine. General instructions  Take over-the-counter and prescription medicines only as told by your doctor. Return to your normal activities as told. Ask what activities are safe for you. Do not use any products that have nicotine or tobacco in them. This includes cigarettes and e-cigarettes. If you need help quitting, ask your doctor. Keep all follow-up visits as told by your doctor. This is important. It is very important if you had a tissue sample (biopsy) taken. Get help right away if: You have shortness of breath that gets worse. You get light-headed. You feel like you are going to pass out (faint). You have chest pain. You cough up: More than a little blood. More blood than before. Summary Do not eat or drink anything (not even water) for 2 hours after your test, or until your numbing medicine wears off. Do not use cigarettes. Do not use e-cigarettes. Get help right away if you have chest pain.  This information is not intended to replace advice given to you by your health care provider. Make sure you discuss any questions you have with your health care provider. Document Released: 10/22/2008 Document Revised: 12/07/2016 Document  Reviewed: 01/13/2016 Elsevier Patient Education  2020 [employer].

## 2020-10-18 NOTE — Interval H&P Note (Signed)
History and Physical Interval Note:  10/18/2020 10:03 AM  Patricia [name]  has presented today for surgery, with the diagnosis of lung nodule, adenopathy.  The various methods of treatment have been discussed with the patient and family. After consideration of risks, benefits and other options for treatment, the patient has consented to  Procedure(s) with comments: [place] (Left) - ION VIDEO BRONCHOSCOPY WITH ENDOBRONCHIAL ULTRASOUND (Bilateral) as a surgical intervention.  The patient's history has been reviewed, patient examined, no change in status, stable for surgery.  I have reviewed the patient's chart and labs.  Questions were answered to the patient's satisfaction.     [place]

## 2020-10-18 NOTE — Progress Notes (Signed)
Patient's HR in the 130's.  Dr. Ermalene Postin aware - no new orders received.   Scheduled metoprolol 50 mg given at 0959.

## 2020-10-18 NOTE — Progress Notes (Signed)
PROGRESS NOTE  Patricia [name]  DOB: 1950-02-19  PCP: [name], MD PPI:[id number]  DOA: 10/13/2020  LOS: 5 days  Hospital Day: 6   Chief Complaint  Patient presents with   Abnormal Lab   Weakness    Brief narrative: Patricia [name] is a 70 y.o. female with PMH significant for paroxysmal atrial fibrillation on Eliquis, history of TIA/CVA, hypertension, anxiety, chronic pain, and hyponatremia during recent admission. Patient presented to the ED on 10/6 with fatigue, nausea, headache and recurrent hyponatremia on outpatient blood work.  She had a sodium level of 135 on the day of discharge on 10/05/2020, repeat sodium level on 10/6 was very low at 118 and hence directed to the ED. In her recent hospitalization from 9/23 through 9/28, patient had a low sodium level of 126 which further dropped down to 225 with IV fluid.  Nephrology was consulted.  Patient was initiated on fluid restriction and was also given a dose of tolvaptan.  Sodium level at discharge was 135.  In the ED, patient was afebrile, blood pressure elevated 163/99, breathing on room air Recheck of sodium level showed 119, serum osmolality low at 255, urine osmolality elevated to 755.  Labs otherwise unremarkable Admitted to hospitalist service and started on 3% saline See below for details  Subjective: Patient was seen and examined this morning.   Propped up in bed.  Not in distress.  Not on supplemental oxygen.  Waiting for bronchoscopy today.  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  Euvolemic on exam. -Patient was treated with 3% saline. 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  -Per nephrology recommendation, currently patient is on 7-day course of 2 g sodium chloride tablets twice daily, Lasix 20 mg oral  daily and 1000 mL fluid restriction -Continue to monitor labs.  Sodium level today 134. Recent Labs  Lab 10/15/20 0407 10/15/20 0840 10/15/20 1200 10/15/20 1259 10/15/20 2122 10/16/20 0450 10/16/20 1630 10/16/20 2255 10/17/20 0608 10/18/20 0235  NA 127* 122* 119* 119* 129* 132* 131* 136 136 134*   New diagnosis of bronchogenic carcinoma -50 years of smoking, quitting 4 years ago.  She has had few visits in last 1 year for COPD flareup.  Multiple chest x-rays in the interval remained traditionally unremarkable.  However CT chest on 10/8 identified a 3.8 cm central left lower lobe mass, left hilar and subcarinal lymphadenopathy consistent with primary lung cancer. -Pulmonary consult with Dr. Lamonte Sakai appreciated.  Patient is planned for robotic assisted navigational bronchoscopy, EBUS today. MRI brain negative for any intracranial metastasis. -Patient was previously on Eliquis.  Currently it is on hold.  Paroxysmal atrial fibrillation -Currently in sinus rhythm on metoprolol. -Eliquis on hold.  History of TIA/CVA -Continue Crestor.  Eliquis on hold.   Essential hypertension  -Continue metoprolol.  Losartan remains on hold at this time  Chronic pain  -Continue home meds -Neurontin, tramadol   Anxiety, insomnia  -Continue home regimen  -Soma, Klonopin, Sonata   Chronic bronchitis  -Stable on admission  -Continue ICS/LABA and as-needed albuterol    Constipation -Continue Linzess which she takes about 2 times a week   Mobility: Encourage ambulation Code Status:   Code Status: Full Code  Nutritional status: Body mass index is 28.66 kg/m.     Diet:  Diet Order  Diet NPO time specified  Diet effective midnight                  DVT prophylaxis: Eliquis on hold.  SCDs for now   Antimicrobials: None Fluid:  Consultants: Nephrology Family Communication: Family not at bedside this morning   Status is: Inpatient  Remains inpatient appropriate because:  Pending bronchoscopy today 10/11.  Dispo: The patient is from: Home              Anticipated d/c is to: Pending bronchoscopy today.  Hopefully home tomorrow.              Patient currently is not medically stable to d/c.   Difficult to place patient No     Infusions:      Scheduled Meds:  [MAR Hold] carisoprodol  350 mg Oral TID   [MAR Hold] Chlorhexidine Gluconate Cloth  6 each Topical Daily   [MAR Hold] clonazePAM  1 mg Oral QHS   [MAR Hold] furosemide  20 mg Oral Daily   [MAR Hold] gabapentin  600 mg Oral BID   [MAR Hold] linaclotide  145 mcg Oral q1600   [MAR Hold] metoprolol tartrate  50 mg Oral BID   [MAR Hold] mometasone-formoterol  2 puff Inhalation BID   [MAR Hold] rosuvastatin  5 mg Oral Once per day on Mon Wed Fri   [facility] Hold] sodium chloride  2 g Oral BID WC    Antimicrobials: Anti-infectives (From admission, onward)    None       PRN meds: [MAR Hold] acetaminophen **OR** [MAR Hold] acetaminophen, [MAR Hold] albuterol, [MAR Hold] hyoscyamine, [MAR Hold] ondansetron **OR** [MAR Hold] ondansetron (ZOFRAN) IV, [MAR Hold] polyethylene glycol, [MAR Hold] polyvinyl alcohol, [MAR Hold] traMADol, [MAR Hold] zolpidem   Objective: Vitals:   10/18/20 0832 10/18/20 0914  BP:  (!) 110/91  Pulse:  77  Resp:    Temp: 98.1 F (36.7 C)   SpO2:      Intake/Output Summary (Last 24 hours) at 10/18/2020 0951 Last data filed at 10/18/2020 0600 Gross per 24 hour  Intake 285.49 ml  Output 200 ml  Net 85.49 ml   Filed Weights   10/14/20 0500 10/17/20 0500 10/18/20 0500  Weight: 82.6 kg 83 kg 83 kg   Weight change: 0 kg Body mass index is 28.66 kg/m.   Physical Exam: General exam: Pleasant, elderly Caucasian female.  Not in physical distress Skin: No rashes, lesions or ulcers. HEENT: Atraumatic, normocephalic, no obvious bleeding Lungs: Clear to auscultation bilaterally CVS: Regular rate and rhythm, no murmur GI/Abd soft, nontender, nondistended, bowel sound  present CNS: Alert, awake, oriented x3 Psychiatry: Mood appropriate.  Trying to stay calm. Extremities: No pedal edema, no calf tenderness  Data Review: I have personally reviewed the laboratory data and studies available.  Recent Labs  Lab 10/13/20 2105 10/14/20 0115 10/15/20 0407 10/16/20 0450 10/18/20 0235  WBC 4.9 5.5 4.2 4.0 5.0  NEUTROABS 3.4  --   --   --  3.2  HGB 14.5 14.1 13.2 14.5 13.5  HCT 40.4 38.9 36.2 40.5 38.7  MCV 95.7 95.8 94.3 96.4 99.5  PLT 286 286 235 274 256   Recent Labs  Lab 10/15/20 0407 10/15/20 0840 10/15/20 1259 10/15/20 2122 10/16/20 0450 10/16/20 1630 10/16/20 2255 10/17/20 0608 10/18/20 0235  NA 127*   < > 119*   < > 132* 131* 136 136 134*  K 4.3  --  4.3  --  4.2  --   --  4.0 3.6  CL 97*  --  88*  --  97*  --   --  100 99  CO2 24  --  26  --  28  --   --  29 27  GLUCOSE 93  --  83  --  107*  --   --  95 89  BUN 10  --  8  --  12  --   --  12 13  CREATININE 0.55  --  0.53  --  0.71  --   --  0.67 0.57  CALCIUM 8.6*  --  8.5*  --  9.6  --   --  9.3 8.6*   < > = values in this interval not displayed.    F/u labs ordered Unresulted Labs (From admission, onward)     Start     Ordered   10/18/20 0500  CBC with Differential/Platelet  Daily,   R     Question:  Specimen collection method  Answer:  Lab=Lab collect   10/17/20 0935   10/18/20 5277  Basic metabolic panel  Daily,   R     Question:  Specimen collection method  Answer:  Lab=Lab collect   10/17/20 0935   Pending  Surgical PCR screen  Once,   R        Pending            Signed, Terrilee Croak, MD [facility] 10/18/2020

## 2020-10-18 NOTE — Anesthesia Preprocedure Evaluation (Signed)
Anesthesia Evaluation  Patient identified by MRN, date of birth, ID band Patient awake    Reviewed: Allergy & Precautions, NPO status , Patient's Chart, lab work & pertinent test results, reviewed documented beta blocker date and time   History of Anesthesia Complications Negative for: history of anesthetic complications  Airway Mallampati: II  TM Distance: >3 FB Neck ROM: Full    Dental  (+) Dental Advisory Given, Teeth Intact   Pulmonary asthma , neg sleep apnea, neg recent URI, former smoker, neg PE   breath sounds clear to auscultation       Cardiovascular hypertension, Pt. on medications and Pt. on home beta blockers + dysrhythmias Atrial Fibrillation and Supra Ventricular Tachycardia  Rhythm:Regular  1. Left ventricular ejection fraction, by estimation, is 60 to 65%. The  left ventricle has normal function. The left ventricle has no regional  wall motion abnormalities. Left ventricular diastolic parameters are  consistent with Grade I diastolic  dysfunction (impaired relaxation).  2. Right ventricular systolic function is normal. The right ventricular  size is normal.  3. Prominent epicardial fat.  4. The mitral valve is normal in structure. No evidence of mitral valve  regurgitation. No evidence of mitral stenosis.  5. The aortic valve was not well visualized. There is mild calcification  of the aortic valve. Aortic valve regurgitation is not visualized. Mild  aortic valve sclerosis is present, with no evidence of aortic valve  stenosis.  6. The inferior vena cava is normal in size with <50% respiratory  variability, suggesting right atrial pressure of 8 mmHg.    Neuro/Psych PSYCHIATRIC DISORDERS Anxiety TIA   GI/Hepatic Neg liver ROS, GERD  Medicated and Controlled,  Endo/Other  Lab Results      Component                Value               Date                      NA                       134 (L)              10/18/2020                K                        3.6                 10/18/2020                CO2                      27                  10/18/2020                GLUCOSE                  89                  10/18/2020                BUN                      13  10/18/2020                CREATININE               0.57                10/18/2020                CALCIUM                  8.6 (L)             10/18/2020                GFRNONAA                 >60                 10/18/2020             Renal/GU negative Renal ROS     Musculoskeletal   Abdominal   Peds  Hematology Lab Results      Component                Value               Date                      WBC                      5.0                 10/18/2020                HGB                      13.5                10/18/2020                HCT                      38.7                10/18/2020                MCV                      99.5                10/18/2020                PLT                      256                 10/18/2020            eliquis   Anesthesia Other Findings   Reproductive/Obstetrics                             Anesthesia Physical Anesthesia Plan  ASA: 3  Anesthesia Plan: General   Post-op Pain Management:    Induction: Intravenous  PONV Risk Score and Plan: 3 and Ondansetron and Dexamethasone  Airway Management Planned: Oral ETT  Additional Equipment: None  Intra-op Plan:   Post-operative Plan: Extubation in OR  Informed Consent: I have reviewed the patients  History and Physical, chart, labs and discussed the procedure including the risks, benefits and alternatives for the proposed anesthesia with the patient or authorized representative who has indicated his/her understanding and acceptance.     Dental advisory given  Plan Discussed with: CRNA and Anesthesiologist  Anesthesia Plan Comments:         Anesthesia Quick  Evaluation

## 2020-10-18 NOTE — Transfer of Care (Signed)
Immediate Anesthesia Transfer of Care Note  Patient: Patricia [name]  Procedure(s) Performed: VIDEO BRONCHOSCOPY WITH ENDOBRONCHIAL NAVIGATION (Left) VIDEO BRONCHOSCOPY WITH ENDOBRONCHIAL ULTRASOUND (Bilateral) BRONCHIAL BIOPSIES BRONCHIAL BRUSHINGS BRONCHIAL NEEDLE ASPIRATION BIOPSIES VIDEO BRONCHOSCOPY WITH RADIAL ENDOBRONCHIAL ULTRASOUND BRONCHIAL WASHINGS  Patient Location: PACU  Anesthesia Type:General  Level of Consciousness: awake and alert   Airway & Oxygen Therapy: Patient Spontanous Breathing  Post-op Assessment: Report given to RN and Post -op Vital signs reviewed and stable  Post vital signs: Reviewed and stable  Last Vitals:  Vitals Value Taken Time  BP 154/80 10/18/20 1200  Temp    Pulse 74 10/18/20 1203  Resp 18 10/18/20 1203  SpO2 99 % 10/18/20 1203  Vitals shown include unvalidated device data.  Last Pain:  Vitals:   10/18/20 0951  TempSrc: Oral  PainSc: 0-No pain      Patients Stated Pain Goal: 0 (37/90/24 0973)  Complications: No notable events documented.

## 2020-10-18 NOTE — Op Note (Signed)
Video Bronchoscopy with Robotic Assisted Bronchoscopic Navigation  Video Bronchoscopy with Endobronchial Ultrasound Procedure Note  Date of Operation: 10/18/2020   Pre-op Diagnosis: Left lung mass, adenopathy  Post-op Diagnosis: Left lung mass, adenopathy  Surgeon: [name], DO   Assistants: None  Anesthesia: General endotracheal anesthesia  Operation: Flexible video fiberoptic bronchoscopy with robotic assistance and biopsies.  Estimated Blood Loss: Minimal  Complications: None  Indications and History: Patricia [name] is a 70 y.o. female with history of left lung mass, adenopathy. The risks, benefits, complications, treatment options and expected outcomes were discussed with the patient.  The possibilities of pneumothorax, pneumonia, reaction to medication, pulmonary aspiration, perforation of a viscus, bleeding, failure to diagnose a condition and creating a complication requiring transfusion or operation were discussed with the patient who freely signed the consent.    Description of Procedure: The patient was seen in the Preoperative Area, was examined and was deemed appropriate to proceed.  The patient was taken to [facility] endoscopy room 3, identified as Patricia [name] and the procedure verified as Flexible Video Fiberoptic Bronchoscopy.  A Time Out was held and the above information confirmed.   Prior to the date of the procedure a high-resolution CT scan of the chest was performed. Utilizing ION software program a virtual tracheobronchial tree was generated to allow the creation of distinct navigation pathways to the patient's parenchymal abnormalities. After being taken to the operating room general anesthesia was initiated and the patient  was orally intubated. The video fiberoptic bronchoscope was introduced via the endotracheal tube and a general inspection was performed which showed normal right and left lung anatomy, aspiration of the bilateral mainstems was completed  to remove any remaining secretions. Robotic catheter inserted into patient's endotracheal tube.   Target #1 left lower lobe: The distinct navigation pathways prepared prior to this procedure were then utilized to navigate to patient's lesion identified on CT scan. The robotic catheter was secured into place and the vision probe was withdrawn.  Lesion location was approximated using fluoroscopy and radial endobronchial ultrasound for peripheral targeting. Under fluoroscopic guidance transbronchial needle brushings, transbronchial needle biopsies, and transbronchial forceps biopsies were performed to be sent for cytology and pathology. A bronchioalveolar lavage was performed in the left lower lobe and sent for cytology.  Target #2 subcarinal lymph node: The standard scope was then withdrawn and the endobronchial ultrasound was used to identify and characterize the peritracheal, hilar and bronchial lymph nodes. Inspection showed enlarged subcarinal adenopathy. Using real-time ultrasound guidance Wang needle biopsies were take from Station 7 nodes and were sent for cytology. The patient tolerated the procedure well without apparent complications. There was no significant blood loss. The bronchoscope was withdrawn. Anesthesia was reversed and the patient was taken to the PACU for recovery.   At the end of the procedure a general airway inspection was performed and there was no evidence of active bleeding. The bronchoscope was removed.  The patient tolerated the procedure well. There was no significant blood loss and there were no obvious complications. A post-procedural chest x-ray is pending.  Samples Target #1: 1. Transbronchial needle brushings from left lower lobe 2. Transbronchial Wang needle biopsies from left lower lobe 3. Transbronchial forceps biopsies from left lower lobe 4. Bronchoalveolar lavage from left lower lobe  Samples Target #2: 1. Wang needle biopsies from station 7 node  Plans:   The patient will be discharged from the PACU to home when recovered from anesthesia and after chest x-ray is reviewed. We  will review the cytology, pathology and microbiology results with the patient when they become available. Outpatient followup will be with [name], DO.    [name], DO [facility] 10/18/2020 12:08 PM

## 2020-10-18 NOTE — Anesthesia Procedure Notes (Signed)
Procedure Name: Intubation Date/Time: 10/18/2020 10:49 AM Performed by: Inda Coke, CRNA Pre-anesthesia Checklist: Patient identified, Emergency Drugs available, Suction available and Patient being monitored Patient Re-evaluated:Patient Re-evaluated prior to induction Oxygen Delivery Method: Circle System Utilized Preoxygenation: Pre-oxygenation with 100% oxygen Induction Type: IV induction Ventilation: Mask ventilation without difficulty Laryngoscope Size: Mac and 3 Grade View: Grade I Tube type: Oral Tube size: 8.5 mm Number of attempts: 1 Airway Equipment and Method: Stylet and Oral airway Placement Confirmation: ETT inserted through vocal cords under direct vision, positive ETCO2 and breath sounds checked- equal and bilateral Secured at: 22 cm Tube secured with: Tape Dental Injury: Teeth and Oropharynx as per pre-operative assessment

## 2020-10-18 NOTE — Progress Notes (Signed)
PT Cancellation Note  Patient Details Name: Patricia [name] MRN: [id number] DOB: [birth year]   Cancelled Treatment:     Pt at [facility] for EBUS Bronchoscope.  Will attempt to see tomorrow for mobility   Rica Koyanagi  PTA Acute  Rehabilitation Services Pager      [phone] Office      [phone]

## 2020-10-19 ENCOUNTER — Telehealth: Payer: Self-pay | Admitting: Acute Care

## 2020-10-19 DIAGNOSIS — E871 Hypo-osmolality and hyponatremia: Secondary | ICD-10-CM | POA: Diagnosis not present

## 2020-10-19 LAB — CBC WITH DIFFERENTIAL/PLATELET
Abs Immature Granulocytes: 0.03 10*3/uL (ref 0.00–0.07)
Basophils Absolute: 0 10*3/uL (ref 0.0–0.1)
Basophils Relative: 0 %
Eosinophils Absolute: 0 10*3/uL (ref 0.0–0.5)
Eosinophils Relative: 0 %
HCT: 39 % (ref 36.0–46.0)
Hemoglobin: 13.5 g/dL (ref 12.0–15.0)
Immature Granulocytes: 0 %
Lymphocytes Relative: 12 %
Lymphs Abs: 0.8 10*3/uL (ref 0.7–4.0)
MCH: 34.1 pg — ABNORMAL HIGH (ref 26.0–34.0)
MCHC: 34.6 g/dL (ref 30.0–36.0)
MCV: 98.5 fL (ref 80.0–100.0)
Monocytes Absolute: 0.4 10*3/uL (ref 0.1–1.0)
Monocytes Relative: 6 %
Neutro Abs: 5.7 10*3/uL (ref 1.7–7.7)
Neutrophils Relative %: 82 %
Platelets: 268 10*3/uL (ref 150–400)
RBC: 3.96 MIL/uL (ref 3.87–5.11)
RDW: 14.1 % (ref 11.5–15.5)
WBC: 7.1 10*3/uL (ref 4.0–10.5)
nRBC: 0 % (ref 0.0–0.2)

## 2020-10-19 LAB — BASIC METABOLIC PANEL
Anion gap: 8 (ref 5–15)
BUN: 13 mg/dL (ref 8–23)
CO2: 26 mmol/L (ref 22–32)
Calcium: 9.1 mg/dL (ref 8.9–10.3)
Chloride: 98 mmol/L (ref 98–111)
Creatinine, Ser: 0.6 mg/dL (ref 0.44–1.00)
GFR, Estimated: >60 mL/min (ref >60)
Glucose, Bld: 109 mg/dL — ABNORMAL HIGH (ref 70–99)
Potassium: 4.5 mmol/L (ref 3.5–5.1)
Sodium: 132 mmol/L — ABNORMAL LOW (ref 135–145)

## 2020-10-19 MED ORDER — FUROSEMIDE 20 MG PO TABS: 20.0000 mg | ORAL_TABLET | Freq: Every day | ORAL | 2 refills | Status: DC

## 2020-10-19 MED ORDER — APIXABAN 5 MG PO TABS
5.0000 mg | ORAL_TABLET | Freq: Two times a day (BID) | ORAL | Status: DC
  Administered 2020-10-19: 5 mg via ORAL
  Filled 2020-10-19: qty 1

## 2020-10-19 MED ORDER — SODIUM CHLORIDE 1 G PO TABS: 2.0000 g | ORAL_TABLET | Freq: Three times a day (TID) | ORAL | 0 refills | Status: AC

## 2020-10-19 NOTE — Telephone Encounter (Signed)
Patient in patient. F/U appt made. Nothing further needed at this time.

## 2020-10-19 NOTE — Progress Notes (Signed)
   10/18/20 2120 10/19/20 0212  Assess: MEWS Score  Pulse Rate (!) 145 (IV metoprolol given per orders) (!) 127 (IV metoprolol given per orders for HR >120 and sustaining)  Assess: MEWS Score  MEWS Temp 0 0  MEWS Systolic 0 0  MEWS Pulse 3 2  MEWS RR 0 0  MEWS LOC 0 0  MEWS Score 3 2  MEWS Score Color Yellow Yellow  Assess: if the MEWS score is Yellow or Red  Were vital signs taken at a resting state?  --  Yes  Focused Assessment  --  No change from prior assessment (yellow MEWs only d/t elevated pulse - PRN metoprolol given per orders)  Does the patient meet 2 or more of the SIRS criteria?  --  No  MEWS guidelines implemented *See Row Information*  --  Yes  Take Vital Signs  Increase Vital Sign Frequency   --  Yellow: Q 2hr X 2 then Q 4hr X 2, if remains yellow, continue Q 4hrs  Escalate  MEWS: Escalate  --  Yellow: discuss with charge nurse/RN and consider discussing with provider and RRT  Notify: Charge Nurse/RN  Name of Charge Nurse/RN Notified  --  Colletta Maryland, RN  Date Charge Nurse/RN Notified  --  10/19/20  Time Charge Nurse/RN Notified  --  0212  Notify: Provider  Provider Name/Title  --  J. Olena Heckle  Date Provider Notified  --  10/19/20  Time Provider Notified  --  [phone]  Notification Type  --   (secure chat)  Notification Reason  --  Other (Comment) (yellow MEWs d/t pulse only - administering PRN meds to lower pulse)  Provider response  --  No new orders  Date of Provider Response  --  10/19/20  Time of Provider Response  --  0220  Assess: SIRS CRITERIA  SIRS Temperature  0 0  SIRS Pulse 1 1  SIRS Respirations  0 0  SIRS WBC 0 0  SIRS Score Sum  1 1  Yellow MEWs d/t tachycardia again - with HR sustaining at 127. Per orders, IV metoprolol given and pulse stabilized to mid 70's again. Provider and charge nurse notified as well. Vitals stable. Continuing with yellow MEWs protocol to further monitor pulse. Pt now back in green MEWs. Will continue to monitor.

## 2020-10-19 NOTE — Anesthesia Postprocedure Evaluation (Signed)
Anesthesia Post Note  Patient: Patricia [name]  Procedure(s) Performed: VIDEO BRONCHOSCOPY WITH ENDOBRONCHIAL NAVIGATION (Left) VIDEO BRONCHOSCOPY WITH ENDOBRONCHIAL ULTRASOUND (Bilateral) BRONCHIAL BIOPSIES BRONCHIAL BRUSHINGS BRONCHIAL NEEDLE ASPIRATION BIOPSIES VIDEO BRONCHOSCOPY WITH RADIAL ENDOBRONCHIAL ULTRASOUND BRONCHIAL WASHINGS     Patient location during evaluation: PACU Anesthesia Type: General Level of consciousness: awake and alert Pain management: pain level controlled Vital Signs Assessment: post-procedure vital signs reviewed and stable Respiratory status: spontaneous breathing, nonlabored ventilation, respiratory function stable and patient connected to nasal cannula oxygen Cardiovascular status: blood pressure returned to baseline and stable Postop Assessment: no apparent nausea or vomiting Anesthetic complications: no   No notable events documented.  Last Vitals:  Vitals:   10/19/20 0645 10/19/20 0816  BP: (!) 147/88   Pulse: (!) 129   Resp: 18   Temp: 36.7 C   SpO2: 96% 96%    Last Pain:  Vitals:   10/19/20 0645  TempSrc: Oral  PainSc:                  Patricia [name]

## 2020-10-19 NOTE — Discharge Summary (Signed)
Physician Discharge Summary  Patricia [name] VZC:[id number] DOB: 1950/09/25 DOA: 10/13/2020  PCP: Patricia Frees, MD  Admit date: 10/13/2020 Discharge date: 10/19/2020  Admitted From: Home Discharge disposition: Home   Code Status: Full Code   Discharge Diagnosis:   Principal Problem:   Hyponatremia Active Problems:   Asthmatic bronchitis   Essential hypertension   Anxiety   AF (paroxysmal atrial fibrillation) (HCC)   Paroxysmal SVT (supraventricular tachycardia) (HCC)   Chronic pain   Lung nodule    Chief Complaint  Patient presents with   Abnormal Lab   Weakness    Brief narrative: Patricia [name] is a 70 y.o. female with PMH significant for paroxysmal atrial fibrillation on Eliquis, history of TIA/CVA, hypertension, anxiety, chronic pain, and hyponatremia during recent admission. Patient presented to the ED on 10/6 with fatigue, nausea, headache and recurrent hyponatremia on outpatient blood work.  She had a sodium level of 135 on the day of discharge on 10/05/2020, repeat sodium level on 10/6 was very low at 118 and hence directed to the ED. In her recent hospitalization from 9/23 through 9/28, patient had a low sodium level of 126 which further dropped down to 225 with IV fluid.  Nephrology was consulted.  Patient was initiated on fluid restriction and was also given a dose of tolvaptan.  Sodium level at discharge was 135.  In the ED, patient was afebrile, blood pressure elevated 163/99, breathing on room air Recheck of sodium level showed 119, serum osmolality low at 255, urine osmolality elevated to 755.  Labs otherwise unremarkable Admitted to hospitalist service and started on 3% saline See below for details  Subjective: Patient was seen and examined this morning.   Propped up in bed.  Not in distress.  Not on supplemental oxygen.  Pastor at bedside.  Sodium level slightly lower today at 132.  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  Euvolemic on exam. -Patient was treated with 3% saline. 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  -Per nephrology recommendation, currently patient is 2 g sodium chloride tablets twice daily, Lasix 20 mg oral daily and 1000 mL fluid restriction. -Sodium level has been gradually downtrending for last 48 hours, 132 today. -I discussed the case this morning with nephrologist Dr. Candiss Norse.  Based on patient's declining sodium level despite being on 2 g twice daily of salt tablets, he recommended to increase the dose to 2 g 3 times daily and discharged the same for at least a month supply.  Also continue Lasix and fluid restriction.  Patient will follow up with PCP for sodium level check in a week and also with nephrology. Recent Labs  Lab 10/15/20 0840 10/15/20 1200 10/15/20 1259 10/15/20 2122 10/16/20 0450 10/16/20 1630 10/16/20 2255 10/17/20 0608 10/18/20 0235 10/19/20 0535  NA 122* 119* 119* 129* 132* 131* 136 136 134* 132*   New diagnosis of bronchogenic carcinoma -50 years of smoking, quitting 4 years ago.  She has had few visits in last 1 year for COPD flareup.  Multiple chest x-rays in the interval remained traditionally unremarkable.  However CT chest on 10/8 identified a 3.8 cm central left lower lobe mass, left hilar and subcarinal lymphadenopathy consistent with primary lung cancer. -Pulmonary consult with Dr. Lamonte Sakai appreciated.  On 10/11, patient underwent robotic assisted navigational bronchoscopy.  Pending report.  Follow-up with pulmonology as an  outpatient. -MRI brain negative for any intracranial metastasis. -Patient was previously on Eliquis.  Currently it is on hold.  Paroxysmal atrial fibrillation -Currently in sinus rhythm on metoprolol. -Eliquis was held for the procedure.  It has been resumed now.  History of  TIA/CVA -Continue Crestor and Eliquis   Essential hypertension  -Continue metoprolol.  Losartan remains on hold at this time.  Blood pressure gradually trending up.  Can resume losartan post discharge.  Chronic pain  -Continue home meds -Neurontin, tramadol   Anxiety, insomnia  -Continue home regimen  -Soma, Klonopin, Sonata   Chronic bronchitis  -Stable on admission  -Continue ICS/LABA and as-needed albuterol    Constipation -Continue Linzess which she takes about 2 times a week   Allergies as of 10/19/2020       Reactions   Augmentin [amoxicillin-pot Clavulanate] Nausea Only, Other (See Comments)   GI upset   Levaquin [levofloxacin In D5w] Other (See Comments)   Joint problems   Mom [magnesium Hydroxide] Other (See Comments)   Welts   Oxycodone Other (See Comments)   Nausea        Medication List     STOP taking these medications    hydrOXYzine 10 MG tablet Commonly known as: ATARAX/VISTARIL   REMIFEMIN PO       TAKE these medications    acetaminophen 325 MG tablet Commonly known as: TYLENOL 1 tablet as needed.   budesonide-formoterol 80-4.5 MCG/ACT inhaler Commonly known as: Symbicort Take 2 puffs first thing in am and then another 2 puffs about 12 hours later.   carboxymethylcellulose 0.5 % Soln Commonly known as: REFRESH PLUS Place 1 drop into both eyes 3 (three) times daily as needed (for dryness).   carisoprodol 350 MG tablet Commonly known as: SOMA Take 350 mg by mouth 3 (three) times daily.   clobetasol ointment 0.05 % Commonly known as: TEMOVATE Apply 1 application topically See admin instructions. Apply to vaginal area daily as directed   clonazePAM 1 MG tablet Commonly known as: KLONOPIN Take 1 mg by mouth at bedtime.   Eliquis 5 MG Tabs tablet Generic drug: apixaban TAKE 1 TABLET(5 MG) BY MOUTH TWICE DAILY   fluticasone 50 MCG/ACT nasal spray Commonly known as: FLONASE Place 2 sprays into both nostrils daily as needed for  allergies or rhinitis.   furosemide 20 MG tablet Commonly known as: LASIX Take 1 tablet (20 mg total) by mouth daily. Start taking on: October 20, 2020   gabapentin 600 MG tablet Commonly known as: NEURONTIN Take 600 mg by mouth See admin instructions. Take 600 mg by mouth in the morning and at lunchtime   hyoscyamine 0.125 MG tablet Commonly known as: LEVSIN Take 0.125 mg by mouth as needed.   linaclotide 145 MCG Caps capsule Commonly known as: LINZESS Take 1 capsule (145 mcg total) by mouth daily at 4 PM.   losartan 100 MG tablet Commonly known as: COZAAR Take 100 mg by mouth daily.   metoprolol tartrate 50 MG tablet Commonly known as: LOPRESSOR Take 1 tablet (50 mg total) by mouth 2 (two) times daily.   omeprazole 20 MG capsule Commonly known as: PRILOSEC Take 20 mg by mouth daily before breakfast.   ondansetron 4 MG tablet Commonly known as: Zofran Take 1 tablet (4 mg total) by mouth every 8 (eight) hours as needed for nausea or vomiting.   ondansetron 8 MG disintegrating tablet Commonly known as: ZOFRAN-ODT Take 8 mg by mouth 3 (three) times daily.   polyethylene glycol 17  g packet Commonly known as: MIRALAX / GLYCOLAX Take 34 g by mouth in the morning.   rosuvastatin 5 MG tablet Commonly known as: CRESTOR Take 5 mg by mouth 3 (three) times a week. Mondays Wednesdays Fridays   sodium chloride 1 g tablet Take 2 tablets (2 g total) by mouth 3 (three) times daily with meals.   traMADol 50 MG tablet Commonly known as: ULTRAM Take 100 mg by mouth as needed.   Ventolin HFA 108 (90 Base) MCG/ACT inhaler Generic drug: albuterol Inhale 2 puffs into the lungs every 6 (six) hours as needed for wheezing or shortness of breath.   Vitamin D3 50 MCG (2000 UT) Tabs Take 2,000 Units by mouth daily.   zaleplon 10 MG capsule Commonly known as: SONATA Take 10 mg by mouth at bedtime as needed (for interrupted sleep).        Discharge Instructions:  Diet  Recommendation: Regular diet with fluid restriction at 1000 mL/day  Follow with Primary MD Patricia Frees, MD in 7 days   Get CBC/BMP checked in next visit within 1 week by PCP or SNF MD. (We routinely change or add medications that can affect your baseline labs and fluid status, therefore we recommend that you get the mentioned basic workup next visit with your PCP, your PCP may decide not to get them or add new tests based on their clinical decision)  On your next visit with your PCP, please get your medicines reviewed and adjusted.  Please request your PCP  to go over all hospital tests, procedures, radiology results at the follow up, please get all Hospital records sent to your PCP by signing hospital release before you go home.  Activity: As tolerated with Full fall precautions use walker/cane & assistance as needed  Avoid using any recreational substances like cigarette, tobacco, alcohol, or non-prescribed drug.  If you experience worsening of your admission symptoms, develop shortness of breath, life threatening emergency, suicidal or homicidal thoughts you must seek medical attention immediately by calling 911 or calling your MD immediately  if symptoms less severe.  You must read complete instructions/literature along with all the possible adverse reactions/side effects for all the medicines you take and that have been prescribed to you. Take any new medicine only after you have completely understood and accepted all the possible adverse reactions/side effects.   Do not drive, operate heavy machinery, perform activities at heights, swimming or participation in water activities or provide baby sitting services if your were admitted for syncope or siezures until you have seen by Primary MD or a Neurologist and advised to do so again.  Do not drive when taking Pain medications.  Do not take more than prescribed Pain, Sleep and Anxiety Medications  Wear Seat belts while  driving.  Please note You were cared for by a hospitalist during your hospital stay. If you have any questions about your discharge medications or the care you received while you were in the hospital after you are discharged, you can call the unit and asked to speak with the hospitalist on call if the hospitalist that took care of you is not available. Once you are discharged, your primary care physician will handle any further medical issues. Please note that NO REFILLS for any discharge medications will be authorized once you are discharged, as it is imperative that you return to your primary care physician (or establish a relationship with a primary care physician if you do not have one) for your aftercare needs so  that they can reassess your need for medications and monitor your lab values.   Follow ups:    Follow-up Information     Icard, Bradley L, DO. Schedule an appointment as soon as possible for a visit.   Specialty: Pulmonary Disease Contact information: [address] [place] [address] [phone]         Patricia Frees, MD Follow up.   Specialty: Family Medicine Contact information: [place] [address] [phone]         Constance Haw, MD .   Specialty: Cardiology Contact information: [address] [place] [address] [phone]         Gean Quint, MD Follow up.   Specialty: Nephrology Contact information: [address] [place] [address] [phone]                 Wound care:   Incision (Closed) 11/19/19 Wrist Right (Active)  Date First Assessed/Time First Assessed: 11/19/19 2015   Location: Wrist  Location Orientation: Right    Assessments 11/19/2019  9:15 PM 11/20/2019 10:00 AM  Dressing Type Compression wrap Compression wrap  Dressing Clean;Dry;Intact Clean;Dry;Intact  Site / Wound Assessment Dressing in place / Unable to assess Dressing in place / Unable to  assess  Drainage Amount None None  Treatment Ice applied --     No Linked orders to display    Discharge Exam:   Vitals:   10/19/20 0436 10/19/20 0645 10/19/20 0816 10/19/20 1114  BP: (!) 121/96 (!) 147/88  (!) 142/78  Pulse: 72 (!) 129  74  Resp: 17 18  17   Temp: 98.6 F (37 C) 98.1 F (36.7 C)  98.8 F (37.1 C)  TempSrc: Oral Oral  Oral  SpO2: 99% 96% 96% 99%  Weight:      Height:        Body mass index is 28.66 kg/m.  General exam: Pleasant, elderly Caucasian female.  Not in distress Skin: No rashes, lesions or ulcers. HEENT: Atraumatic, normocephalic, no obvious bleeding Lungs: Clear to auscultation bilaterally CVS: Regular rate and rhythm, no murmur GI/Abd soft, nontender, nondistended, bowel sound present CNS: Alert, awake, oriented x3 Psychiatry: Mood appropriate Extremities: No pedal edema, no calf tenderness  Time coordinating discharge: 35 minutes   The results of significant diagnostics from this hospitalization (including imaging, microbiology, ancillary and laboratory) are listed below for reference.    Procedures and Diagnostic Studies:   No results found.   Labs:   Basic Metabolic Panel: Recent Labs  Lab 10/15/20 1259 10/15/20 2122 10/16/20 0450 10/16/20 1630 10/16/20 2255 10/17/20 0608 10/18/20 0235 10/19/20 0535  NA 119*   < > 132* 131* 136 136 134* 132*  K 4.3  --  4.2  --   --  4.0 3.6 4.5  CL 88*  --  97*  --   --  100 99 98  CO2 26  --  28  --   --  29 27 26   GLUCOSE 83  --  107*  --   --  95 89 109*  BUN 8  --  12  --   --  12 13 13   CREATININE 0.53  --  0.71  --   --  0.67 0.57 0.60  CALCIUM 8.5*  --  9.6  --   --  9.3 8.6* 9.1   < > = values in this interval not displayed.   GFR Estimated Creatinine Clearance: 71.5  mL/min (by C-G formula based on SCr of 0.6 mg/dL). Liver Function Tests: Recent Labs  Lab 10/13/20 2105 10/15/20 1259  AST 20 16  ALT 23 17  ALKPHOS 76 68  BILITOT 0.6 0.3  PROT 6.9 6.3*  ALBUMIN 4.1  3.9   No results for input(s): LIPASE, AMYLASE in the last 168 hours. No results for input(s): AMMONIA in the last 168 hours. Coagulation profile No results for input(s): INR, PROTIME in the last 168 hours.  CBC: Recent Labs  Lab 10/13/20 2105 10/14/20 0115 10/15/20 0407 10/16/20 0450 10/18/20 0235 10/19/20 0535  WBC 4.9 5.5 4.2 4.0 5.0 7.1  NEUTROABS 3.4  --   --   --  3.2 5.7  HGB 14.5 14.1 13.2 14.5 13.5 13.5  HCT 40.4 38.9 36.2 40.5 38.7 39.0  MCV 95.7 95.8 94.3 96.4 99.5 98.5  PLT 286 286 235 274 256 268   Cardiac Enzymes: No results for input(s): CKTOTAL, CKMB, CKMBINDEX, TROPONINI in the last 168 hours. BNP: Invalid input(s): POCBNP CBG: No results for input(s): GLUCAP in the last 168 hours. D-Dimer No results for input(s): DDIMER in the last 72 hours. Hgb A1c No results for input(s): HGBA1C in the last 72 hours. Lipid Profile No results for input(s): CHOL, HDL, LDLCALC, TRIG, CHOLHDL, LDLDIRECT in the last 72 hours. Thyroid function studies No results for input(s): TSH, T4TOTAL, T3FREE, THYROIDAB in the last 72 hours.  Invalid input(s): FREET3 Anemia work up No results for input(s): VITAMINB12, FOLATE, FERRITIN, TIBC, IRON, RETICCTPCT in the last 72 hours. Microbiology Recent Results (from the past 240 hour(s))  Resp Panel by RT-PCR (Flu A&B, Covid) Nasopharyngeal Swab     Status: None   Collection Time: 10/13/20 11:14 PM   Specimen: Nasopharyngeal Swab; Nasopharyngeal(NP) swabs in vial transport medium  Result Value Ref Range Status   SARS Coronavirus 2 by RT PCR NEGATIVE NEGATIVE Final    Comment: (NOTE) SARS-CoV-2 target nucleic acids are NOT DETECTED.  The SARS-CoV-2 RNA is generally detectable in upper respiratory specimens during the acute phase of infection. The lowest concentration of SARS-CoV-2 viral copies this assay can detect is 138 copies/mL. A negative result does not preclude SARS-Cov-2 infection and should not be used as the sole basis  for treatment or other patient management decisions. A negative result may occur with  improper specimen collection/handling, submission of specimen other than nasopharyngeal swab, presence of viral mutation(s) within the areas targeted by this assay, and inadequate number of viral copies(<138 copies/mL). A negative result must be combined with clinical observations, patient history, and epidemiological information. The expected result is Negative.  Fact Sheet for Patients:  EntrepreneurPulse.com.au  Fact Sheet for Healthcare Providers:  IncredibleEmployment.be  This test is no t yet approved or cleared by the [place] FDA and  has been authorized for detection and/or diagnosis of SARS-CoV-2 by FDA under an Emergency Use Authorization (EUA). This EUA will remain  in effect (meaning this test can be used) for the duration of the COVID-19 declaration under Section 564(b)(1) of the Act, 21 U.S.C.section 360bbb-3(b)(1), unless the authorization is terminated  or revoked sooner.       Influenza A by PCR NEGATIVE NEGATIVE Final   Influenza B by PCR NEGATIVE NEGATIVE Final    Comment: (NOTE) The Xpert Xpress SARS-CoV-2/FLU/RSV plus assay is intended as an aid in the diagnosis of influenza from Nasopharyngeal swab specimens and should not be used as a sole basis for treatment. Nasal washings and aspirates are unacceptable for Xpert Xpress SARS-CoV-2/FLU/RSV  testing.  Fact Sheet for Patients: EntrepreneurPulse.com.au  Fact Sheet for Healthcare Providers: IncredibleEmployment.be  This test is not yet approved or cleared by the [place] FDA and has been authorized for detection and/or diagnosis of SARS-CoV-2 by FDA under an Emergency Use Authorization (EUA). This EUA will remain in effect (meaning this test can be used) for the duration of the COVID-19 declaration under Section 564(b)(1) of the Act, 21  U.S.C. section 360bbb-3(b)(1), unless the authorization is terminated or revoked.  Performed at [facility], [place] [address], [place], [place] [address]   MRSA Next Gen by PCR, Nasal     Status: None   Collection Time: 10/14/20  4:44 AM   Specimen: Nasal Mucosa; Nasal Swab  Result Value Ref Range Status   MRSA by PCR Next Gen NOT DETECTED NOT DETECTED Final    Comment: (NOTE) The GeneXpert MRSA Assay (FDA approved for NASAL specimens only), is one component of a comprehensive MRSA colonization surveillance program. It is not intended to diagnose MRSA infection nor to guide or monitor treatment for MRSA infections. Test performance is not FDA approved in patients less than 97 years old. Performed at [facility], [place] [address] [place],  [address]      Signed: Marlowe Aschoff Marybelle Giraldo  [facility] 10/19/2020, 1:37 PM

## 2020-10-19 NOTE — Care Management Important Message (Signed)
Important Message  Patient Details IM Letter given to the Patient. Name: Patricia [name] MRN: [id number] Date of Birth: [birth year]   Medicare Important Message Given:  Yes     Kerin Salen 10/19/2020, 10:42 AM

## 2020-10-19 NOTE — Progress Notes (Signed)
Patient seen today  Post bronchoscopy with biopsy  Discussed findings with patient and spouse at bedside  Updated patient's daughter [name] by phone  Patient was upset about being told about surgery not being an option for management of her cancer -Based on location of the lesion and adenopathy-will not be a candidate for surgery  Further management depends on tissue type  Physical exam is unchanged Decreased air entry bilaterally  Lung mass with adenopathy Presence of subcarinal and hilar adenopathy  Chronic obstructive pulmonary disease/emphysema  Paroxysmal atrial fibrillation  Hypertension  Hyponatremia  Stable from a pulmonary perspective to be discharged To follow-up with oncology Outpatient follow-up with Dr. Arlean Hopping, MD [place] [facility] Pager: See Shea Evans

## 2020-10-19 NOTE — Progress Notes (Signed)
PCCM Progress Note  Patient seen today post bronch and states she feels well with no acute complaints. Reports anxiety and sadness regarding ongoing workup for likley new diagnosis for lung cancer.   No acute events overnight, from a pulmonary standpoint patient can be discharged when deemed appropriate from primary team.   Will set up follow up apt with pulmonary at discharge.   PCCM will sign off. Thank you for the opportunity to participate in this patient's care. Please contact if we can be of further assistance.  Patricia [name] D. Kenton Kingfisher, NP-C [facility] Personal contact information can be found on Amion  10/19/2020, 11:39 AM

## 2020-10-19 NOTE — Progress Notes (Signed)
   10/18/20 2120  Assess: MEWS Score  Pulse Rate (!) 145 (IV metoprolol given per orders)  Assess: MEWS Score  MEWS Temp 0  MEWS Systolic 0  MEWS Pulse 3  MEWS RR 0  MEWS LOC 0  MEWS Score 3  MEWS Score Color Yellow  Assess: if the MEWS score is Yellow or Red  Were vital signs taken at a resting state? Yes  Focused Assessment No change from prior assessment  Does the patient meet 2 or more of the SIRS criteria? No  MEWS guidelines implemented *See Row Information* No, vital signs rechecked (pulse immediately dropped after IV metoprolol administered)  Notify: Charge Nurse/RN  Name of Charge Nurse/RN Notified [place], RN  Date Charge Nurse/RN Notified 10/18/20  Time Charge Nurse/RN Notified 2120  Notify: Provider  Provider Name/Title J. Olena Heckle  Date Provider Notified 10/18/20  Time Provider Notified 2120  Notification Type  (secure chat)  Notification Reason Other (Comment)  Document  Patient Outcome Stabilized after interventions  Assess: SIRS CRITERIA  SIRS Temperature  0  SIRS Pulse 1  SIRS Respirations  0  SIRS WBC 0  SIRS Score Sum  1  Pt tachycardic with heart rate of 145 - provider notified and PRN metoprolol administered per orders. HR immediately stabilized and sustained in the 70s. Pt was completely asymptomatic and all other vitals within normal range. Charge nurse notified as well.

## 2020-10-20 ENCOUNTER — Other Ambulatory Visit: Payer: Self-pay

## 2020-10-20 ENCOUNTER — Telehealth: Payer: Self-pay | Admitting: *Deleted

## 2020-10-20 ENCOUNTER — Encounter (HOSPITAL_COMMUNITY): Payer: Self-pay | Admitting: Pulmonary Disease

## 2020-10-20 ENCOUNTER — Other Ambulatory Visit: Payer: Self-pay | Admitting: Internal Medicine

## 2020-10-20 DIAGNOSIS — R911 Solitary pulmonary nodule: Secondary | ICD-10-CM

## 2020-10-20 DIAGNOSIS — C3490 Malignant neoplasm of unspecified part of unspecified bronchus or lung: Secondary | ICD-10-CM

## 2020-10-20 LAB — CYTOLOGY - NON PAP

## 2020-10-20 NOTE — Telephone Encounter (Signed)
I received referral on Patricia [name] today.  I updated Dr. Julien Nordmann.  He states he will see her tomorrow. I called patient to update on appt. She verbalized understanding of appt.

## 2020-10-20 NOTE — Progress Notes (Signed)
Patricia [name],   Please place referral to Dr. Julien Nordmann.  Also needs a referral to radiation oncology.  She is already had a brain MRI.  Please schedule nuclear medicine PET scan.  She was just discharged from the hospital.  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  Thanks,  [name]  [name], DO [facility] 10/20/2020 12:23 PM

## 2020-10-21 ENCOUNTER — Other Ambulatory Visit: Payer: Self-pay

## 2020-10-21 ENCOUNTER — Encounter: Payer: Self-pay | Admitting: *Deleted

## 2020-10-21 ENCOUNTER — Inpatient Hospital Stay: Payer: Medicare HMO

## 2020-10-21 ENCOUNTER — Inpatient Hospital Stay: Payer: Medicare HMO | Attending: Internal Medicine | Admitting: Internal Medicine

## 2020-10-21 ENCOUNTER — Encounter: Payer: Self-pay | Admitting: Internal Medicine

## 2020-10-21 VITALS — BP 142/88 | HR 77 | Temp 97.9°F | Resp 20 | Ht 67.0 in | Wt 185.7 lb

## 2020-10-21 DIAGNOSIS — I252 Old myocardial infarction: Secondary | ICD-10-CM | POA: Diagnosis not present

## 2020-10-21 DIAGNOSIS — Z803 Family history of malignant neoplasm of breast: Secondary | ICD-10-CM | POA: Diagnosis not present

## 2020-10-21 DIAGNOSIS — C3490 Malignant neoplasm of unspecified part of unspecified bronchus or lung: Secondary | ICD-10-CM

## 2020-10-21 DIAGNOSIS — I10 Essential (primary) hypertension: Secondary | ICD-10-CM

## 2020-10-21 DIAGNOSIS — C3432 Malignant neoplasm of lower lobe, left bronchus or lung: Secondary | ICD-10-CM | POA: Diagnosis not present

## 2020-10-21 DIAGNOSIS — E559 Vitamin D deficiency, unspecified: Secondary | ICD-10-CM

## 2020-10-21 DIAGNOSIS — E538 Deficiency of other specified B group vitamins: Secondary | ICD-10-CM | POA: Diagnosis not present

## 2020-10-21 DIAGNOSIS — Z5111 Encounter for antineoplastic chemotherapy: Secondary | ICD-10-CM | POA: Diagnosis not present

## 2020-10-21 DIAGNOSIS — E871 Hypo-osmolality and hyponatremia: Secondary | ICD-10-CM | POA: Diagnosis not present

## 2020-10-21 DIAGNOSIS — Z87891 Personal history of nicotine dependence: Secondary | ICD-10-CM

## 2020-10-21 LAB — CBC WITH DIFFERENTIAL (CANCER CENTER ONLY)
Abs Immature Granulocytes: 0 10*3/uL (ref 0.00–0.07)
Basophils Absolute: 0 10*3/uL (ref 0.0–0.1)
Basophils Relative: 1 %
Eosinophils Absolute: 0.1 10*3/uL (ref 0.0–0.5)
Eosinophils Relative: 3 %
HCT: 37.9 % (ref 36.0–46.0)
Hemoglobin: 12.8 g/dL (ref 12.0–15.0)
Immature Granulocytes: 0 %
Lymphocytes Relative: 21 %
Lymphs Abs: 1 10*3/uL (ref 0.7–4.0)
MCH: 34 pg (ref 26.0–34.0)
MCHC: 33.8 g/dL (ref 30.0–36.0)
MCV: 100.8 fL — ABNORMAL HIGH (ref 80.0–100.0)
Monocytes Absolute: 0.5 10*3/uL (ref 0.1–1.0)
Monocytes Relative: 11 %
Neutro Abs: 2.9 10*3/uL (ref 1.7–7.7)
Neutrophils Relative %: 64 %
Platelet Count: 258 10*3/uL (ref 150–400)
RBC: 3.76 MIL/uL — ABNORMAL LOW (ref 3.87–5.11)
RDW: 14.6 % (ref 11.5–15.5)
WBC Count: 4.5 10*3/uL (ref 4.0–10.5)
nRBC: 0 % (ref 0.0–0.2)

## 2020-10-21 LAB — CMP (CANCER CENTER ONLY)
ALT: 26 U/L (ref 0–44)
AST: 25 U/L (ref 15–41)
Albumin: 3.7 g/dL (ref 3.5–5.0)
Alkaline Phosphatase: 68 U/L (ref 38–126)
Anion gap: 7 (ref 5–15)
BUN: 13 mg/dL (ref 8–23)
CO2: 30 mmol/L (ref 22–32)
Calcium: 8.5 mg/dL — ABNORMAL LOW (ref 8.9–10.3)
Chloride: 102 mmol/L (ref 98–111)
Creatinine: 0.72 mg/dL (ref 0.44–1.00)
GFR, Estimated: >60 mL/min (ref >60)
Glucose, Bld: 79 mg/dL (ref 70–99)
Potassium: 3.8 mmol/L (ref 3.5–5.1)
Sodium: 139 mmol/L (ref 135–145)
Total Bilirubin: 0.3 mg/dL (ref 0.3–1.2)
Total Protein: 6.3 g/dL — ABNORMAL LOW (ref 6.5–8.1)

## 2020-10-21 MED ORDER — PROCHLORPERAZINE MALEATE 10 MG PO TABS: 10.0000 mg | ORAL_TABLET | Freq: Four times a day (QID) | ORAL | 0 refills | Status: DC | PRN

## 2020-10-21 NOTE — Progress Notes (Signed)
Oncology Nurse Navigator Documentation  Oncology Nurse Navigator Flowsheets 10/21/2020  Abnormal Finding Date 10/15/2020  Confirmed Diagnosis Date 10/18/2020  Diagnosis Status Confirmed Diagnosis Complete  Planned Course of Treatment Chemo/Radiation Concurrent  Phase of Treatment Radiation  Navigator Follow Up Date: 10/25/2020  Navigator Follow Up Reason: Appointment Review  Navigator Location [facility]  Referral Date to RadOnc/MedOnc 10/20/2020  Navigator Encounter Type Initial MedOnc  Patient Visit Type Initial;MedOnc  Treatment Phase Pre-Tx/Tx Discussion  Barriers/Navigation Needs Education  Education Newly Diagnosed Cancer Education;Other  Interventions Psycho-Social Support;Education  Acuity Level 2-Minimal Needs (1-2 Barriers Identified)  Education Method Written;Verbal  Time Spent with Patient 30

## 2020-10-21 NOTE — Progress Notes (Signed)
START ON PATHWAY REGIMEN - Small Cell Lung     A cycle is every 21 days:     Etoposide      Cisplatin   **Always confirm dose/schedule in your pharmacy ordering system**  Patient Characteristics: Newly Diagnosed, Preoperative or Nonsurgical Candidate (Clinical Staging), First Line, Limited Stage, Nonsurgical Candidate Therapeutic Status: Newly Diagnosed, Preoperative or Nonsurgical Candidate (Clinical Staging) AJCC T Category: cT2a AJCC N Category: cN2 AJCC M Category: cM0 AJCC 8 Stage Grouping: IIIA Stage Classification: Limited Surgical Candidacy: Nonsurgical Candidate Intent of Therapy: Curative Intent, Discussed with Patient

## 2020-10-21 NOTE — Progress Notes (Signed)
[place] Telephone:([phone]) [phone]   [phone]) [phone]  CONSULT NOTE  REFERRING PHYSICIAN: Dr. Leory Plowman Icard  REASON FOR CONSULTATION:  70 years old white female recently diagnosed with lung cancer.  HPI Patricia [name] is a 70 y.o. female with past medical history significant for hypertension, GERD, dyslipidemia, irritable bowel syndrome, TIA, and anxiety as well as long history of smoking.  The patient mentioned that she has been complaining of increasing fatigue and weakness as well as nausea and headaches.  She passed out at home around 3 weeks ago.  She was evaluated at one of the urgent care center and was found to have hyponatremia.  She was then sent to the emergency department for evaluation and she had CT scan of the head that was unremarkable.  She also has MRI of the brain that was negative for any acute intracranial abnormalities.  The patient then had CT scan of the chest, abdomen pelvis on 10/15/2020 and that showed irregular solid 3.8 x 3.0 cm central left lower lobe lung mass in addition to enlarged 1.4 cm left infrahilar node.  There was also enlarged 2.1 cm subcarinal node.  The patient was seen by Dr. Valeta Harms and on October 18, 2020 she underwent video bronchoscopy with endobronchial ultrasound and biopsies. 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 The malignant cells are positive with cytokeratin AE1/AE3, CD56,  synaptophysin, chromogranin and TTF-1 and they are negative with p63 and cytokeratin 5/6.  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  Dr. Valeta Harms kindly referred the patient to me today for evaluation and recommendation regarding treatment of her condition. When seen today she is feeling fine except for the swimmy head and constipation.  She also has cough productive of greenish-whitish sputum.  She has no chest pain, shortness of breath or hemoptysis.  She  denied having any recent weight loss or night sweats.  She has no headache or visual changes. Family history significant for mother died from old age.  Father had Parkinson's disease and sister had breast cancer. The patient is married and has 2 daughters she was accompanied today by her husband Patricia [name] and her 2 daughters Patricia [name] and Patricia [name] were available by phone during the visit.  She used to work as a Garment/textile technologist.  She has a history of smoking more than 1 pack/day for around 50 years and quit December 14, 2019.  She drinks alcohol occasionally and no history of drug abuse.  HPI  Past Medical History:  Diagnosis Date   Anxiety    GERD (gastroesophageal reflux disease)    Hyperlipidemia    Hypertension    IBS (irritable bowel syndrome)    Insomnia    Low back pain    TIA (transient ischemic attack) 10/2017   no deficits   Vitamin B12 deficiency    Vitamin D deficiency     Past Surgical History:  Procedure Laterality Date   BACK SURGERY     BREAST EXCISIONAL BIOPSY Left 1997   benign   BRONCHIAL BIOPSY  10/18/2020   Procedure: BRONCHIAL BIOPSIES;  Surgeon: [name], DO;  Location: [facility];  Service: Pulmonary;;   BRONCHIAL BRUSHINGS  10/18/2020   Procedure: BRONCHIAL BRUSHINGS;  Surgeon: [name], DO;  Location: [facility];  Service: Pulmonary;;   BRONCHIAL NEEDLE ASPIRATION BIOPSY  10/18/2020   Procedure: BRONCHIAL NEEDLE ASPIRATION BIOPSIES;  Surgeon: [name], DO;  Location: [facility];  Service: Pulmonary;;  BRONCHIAL WASHINGS  10/18/2020   Procedure: BRONCHIAL WASHINGS;  Surgeon: [name], DO;  Location: [facility];  Service: Pulmonary;;   hemorrhoidecotmy     HERNIA MESH REMOVAL     OPEN REDUCTION INTERNAL FIXATION (ORIF) DISTAL RADIAL FRACTURE Right 11/19/2019   Procedure: OPEN REDUCTION INTERNAL FIXATION (ORIF) DISTAL RADIUS AND ULNA FRACTURE WITH REPAIR AS NECESSARY;  Surgeon: Roseanne Kaufman, MD;  Location: [facility];  Service:  Orthopedics;  Laterality: Right;  2 hrs Block with IV sedation   ORIF RADIUS & ULNA FRACTURES     TONSILLECTOMY     VIDEO BRONCHOSCOPY WITH ENDOBRONCHIAL NAVIGATION Left 10/18/2020   Procedure: VIDEO BRONCHOSCOPY WITH ENDOBRONCHIAL NAVIGATION;  Surgeon: [name], DO;  Location: [facility];  Service: Pulmonary;  Laterality: Left;  ION   VIDEO BRONCHOSCOPY WITH ENDOBRONCHIAL ULTRASOUND Bilateral 10/18/2020   Procedure: VIDEO BRONCHOSCOPY WITH ENDOBRONCHIAL ULTRASOUND;  Surgeon: [name], DO;  Location: [facility];  Service: Pulmonary;  Laterality: Bilateral;   VIDEO BRONCHOSCOPY WITH RADIAL ENDOBRONCHIAL ULTRASOUND  10/18/2020   Procedure: VIDEO BRONCHOSCOPY WITH RADIAL ENDOBRONCHIAL ULTRASOUND;  Surgeon: [name], DO;  Location: [facility];  Service: Pulmonary;;    Family History  Problem Relation Age of Onset   Breast cancer Sister    Breast cancer Maternal Aunt    Breast cancer Paternal Aunt     Social History Social History   Tobacco Use   Smoking status: Former    Packs/day: 1.00    Years: 50.00    Pack years: 50.00    Types: Cigarettes    Start date: 34    Quit date: 12/14/2019    Years since quitting: 0.8   Smokeless tobacco: Never  Vaping Use   Vaping Use: Never used  Substance Use Topics   Alcohol use: Yes    Comment: occ once every 2 months   Drug use: Never    Allergies  Allergen Reactions   Augmentin [Amoxicillin-Pot Clavulanate] Nausea Only and Other (See Comments)    GI upset   Levaquin [Levofloxacin In D5w] Other (See Comments)    Joint problems    Mom [Magnesium Hydroxide] Other (See Comments)    Welts    Oxycodone Other (See Comments)    Nausea     Current Outpatient Medications  Medication Sig Dispense Refill   acetaminophen (TYLENOL) 325 MG tablet 1 tablet as needed.     budesonide-formoterol (SYMBICORT) 80-4.5 MCG/ACT inhaler Take 2 puffs first thing in am and then another 2 puffs about 12 hours later. 1 each  12   carboxymethylcellulose (REFRESH PLUS) 0.5 % SOLN Place 1 drop into both eyes 3 (three) times daily as needed (for dryness).      carisoprodol (SOMA) 350 MG tablet Take 350 mg by mouth 3 (three) times daily.   5   Cholecalciferol (VITAMIN D3) 50 MCG (2000 UT) TABS Take 2,000 Units by mouth daily.     clobetasol ointment (TEMOVATE) 3.66 % Apply 1 application topically See admin instructions. Apply to vaginal area daily as directed     clonazePAM (KLONOPIN) 1 MG tablet Take 1 mg by mouth at bedtime.   3   ELIQUIS 5 MG TABS tablet TAKE 1 TABLET(5 MG) BY MOUTH TWICE DAILY 60 tablet 6   fluticasone (FLONASE) 50 MCG/ACT nasal spray Place 2 sprays into both nostrils daily as needed for allergies or rhinitis.   5   furosemide (LASIX) 20 MG tablet Take 1 tablet (20 mg total) by mouth daily. 30 tablet 2  gabapentin (NEURONTIN) 600 MG tablet Take 600 mg by mouth See admin instructions. Take 600 mg by mouth in the morning and at lunchtime     hyoscyamine (LEVSIN) 0.125 MG tablet Take 0.125 mg by mouth as needed.     linaclotide (LINZESS) 145 MCG CAPS capsule Take 1 capsule (145 mcg total) by mouth daily at 4 PM. 30 capsule 0   losartan (COZAAR) 100 MG tablet Take 100 mg by mouth daily.      metoprolol tartrate (LOPRESSOR) 50 MG tablet Take 1 tablet (50 mg total) by mouth 2 (two) times daily. 60 tablet 0   omeprazole (PRILOSEC) 20 MG capsule Take 20 mg by mouth daily before breakfast.      ondansetron (ZOFRAN) 4 MG tablet Take 1 tablet (4 mg total) by mouth every 8 (eight) hours as needed for nausea or vomiting. 30 tablet 1   ondansetron (ZOFRAN-ODT) 8 MG disintegrating tablet Take 8 mg by mouth 3 (three) times daily.     polyethylene glycol (MIRALAX / GLYCOLAX) packet Take 34 g by mouth in the morning.      rosuvastatin (CRESTOR) 5 MG tablet Take 5 mg by mouth 3 (three) times a week. Mondays Wednesdays Fridays     sodium chloride 1 g tablet Take 2 tablets (2 g total) by mouth 3 (three) times daily with  meals. 180 tablet 0   traMADol (ULTRAM) 50 MG tablet Take 100 mg by mouth as needed.     VENTOLIN HFA 108 (90 Base) MCG/ACT inhaler Inhale 2 puffs into the lungs every 6 (six) hours as needed for wheezing or shortness of breath.   5   zaleplon (SONATA) 10 MG capsule Take 10 mg by mouth at bedtime as needed (for interrupted sleep).      No current facility-administered medications for this visit.    Review of Systems  Constitutional: positive for fatigue Eyes: negative Ears, nose, mouth, throat, and face: negative Respiratory: positive for cough Cardiovascular: negative Gastrointestinal: negative Genitourinary:negative Integument/breast: negative Hematologic/lymphatic: negative Musculoskeletal:negative Neurological: positive for dizziness Behavioral/Psych: negative Endocrine: negative Allergic/Immunologic: negative  Physical Exam  DGL:OVFIE, healthy, no distress, well nourished, well developed, and anxious SKIN: skin color, texture, turgor are normal, no rashes or significant lesions HEAD: Normocephalic, No masses, lesions, tenderness or abnormalities EYES: normal, PERRLA, Conjunctiva are pink and non-injected EARS: External ears normal, Canals clear OROPHARYNX:no exudate, no erythema, and lips, buccal mucosa, and tongue normal  NECK: supple, no adenopathy, no JVD LYMPH:  no palpable lymphadenopathy, no hepatosplenomegaly BREAST:not examined LUNGS: clear to auscultation , and palpation HEART: regular rate & rhythm, no murmurs, and no gallops ABDOMEN:abdomen soft, non-tender, normal bowel sounds, and no masses or organomegaly BACK: Back symmetric, no curvature., No CVA tenderness EXTREMITIES:no joint deformities, effusion, or inflammation, no edema  NEURO: alert & oriented x 3 with fluent speech, no focal motor/sensory deficits  PERFORMANCE STATUS: ECOG 1  LABORATORY DATA: Lab Results  Component Value Date   WBC 4.5 10/21/2020   HGB 12.8 10/21/2020   HCT 37.9  10/21/2020   MCV 100.8 (H) 10/21/2020   PLT 258 10/21/2020      Chemistry      Component Value Date/Time   NA 132 (L) 10/19/2020 0535   K 4.5 10/19/2020 0535   CL 98 10/19/2020 0535   CO2 26 10/19/2020 0535   BUN 13 10/19/2020 0535   CREATININE 0.60 10/19/2020 0535      Component Value Date/Time   CALCIUM 9.1 10/19/2020 0535   ALKPHOS 68 10/15/2020  1259   AST 16 10/15/2020 1259   ALT 17 10/15/2020 1259   BILITOT 0.3 10/15/2020 1259       RADIOGRAPHIC STUDIES: CT Head Wo Contrast  Result Date: 09/30/2020 CLINICAL DATA:  Head trauma after loss of consciousness. EXAM: CT HEAD WITHOUT CONTRAST TECHNIQUE: Contiguous axial images were obtained from the base of the skull through the vertex without intravenous contrast. COMPARISON:  November 07, 2008. FINDINGS: Brain: No evidence of acute infarction, hemorrhage, hydrocephalus, extra-axial collection or mass lesion/mass effect. Vascular: No hyperdense vessel or unexpected calcification. Skull: Normal. Negative for fracture or focal lesion. Sinuses/Orbits: No acute finding. Other: None. IMPRESSION: No acute intracranial abnormality seen. Electronically Signed   By: Marijo Conception M.D.   On: 09/30/2020 20:27   MR BRAIN W WO CONTRAST  Result Date: 10/16/2020 CLINICAL DATA:  Non-small cell lung cancer, staging. EXAM: MRI HEAD WITHOUT AND WITH CONTRAST TECHNIQUE: Multiplanar, multiecho pulse sequences of the brain and surrounding structures were obtained without and with intravenous contrast. CONTRAST:  7.59mL GADAVIST GADOBUTROL 1 MMOL/ML IV SOLN COMPARISON:  CT 09/30/2020.  Brain MRI 10/16/2017. FINDINGS: Brain: Mild generalized cerebral and cerebellar atrophy. Mild multifocal T2 FLAIR hyperintense signal abnormality within the cerebral white matter, nonspecific but compatible with chronic small vessel ischemic disease. There is no acute infarct. No evidence of an intracranial mass. No chronic intracranial blood products. No extra-axial fluid  collection. No midline shift. No pathologic intracranial enhancement is demonstrated to suggest intracranial metastatic disease. Vascular: Maintained flow voids within the proximal large arterial vessels. Skull and upper cervical spine: No focal suspicious marrow lesion. Sinuses/Orbits: Visualized orbits show no acute finding. Mild mucosal thickening versus small volume fluid within the anterior right ethmoid air cells. Other: Trace fluid within the right greater than left mastoid air cells. IMPRESSION: No evidence of intracranial metastatic disease. Mild chronic small vessel ischemic changes within the cerebral white matter. Mild generalized parenchymal atrophy. Mild right ethmoid sinusitis. Trace fluid within the bilateral mastoid air cells. Electronically Signed   By: Kellie Simmering D.O.   On: 10/16/2020 16:17   CT CHEST ABDOMEN PELVIS W CONTRAST  Result Date: 10/15/2020 CLINICAL DATA:  Inpatient. Severe hyponatremia in a previous smoker. 20 pound weight gain in 1 year. EXAM: CT CHEST, ABDOMEN, AND PELVIS WITH CONTRAST TECHNIQUE: Multidetector CT imaging of the chest, abdomen and pelvis was performed following the standard protocol during bolus administration of intravenous contrast. CONTRAST:  47mL OMNIPAQUE IOHEXOL 350 MG/ML SOLN COMPARISON:  11/07/2008 CT chest, abdomen and pelvis. FINDINGS: CT CHEST FINDINGS Cardiovascular: Normal heart size. No significant pericardial effusion/thickening. Three-vessel coronary atherosclerosis. Atherosclerotic nonaneurysmal thoracic aorta. Normal caliber pulmonary arteries. No central pulmonary emboli. Mediastinum/Nodes: No discrete thyroid nodules. Unremarkable esophagus. No axillary adenopathy. Enlarged 2.1 cm subcarinal node (series 2/image 29). No additional pathologically enlarged mediastinal nodes. No right hilar adenopathy. Enlarged 1.4 cm left infrahilar node (series 2/image 37). Lungs/Pleura: No pneumothorax. No pleural effusion. Moderate to severe centrilobular  emphysema with mild diffuse bronchial wall thickening. Irregular solid 3.8 x 3.0 cm central left lower lobe lung mass (series 4/image 105), new. No additional significant pulmonary nodules. Musculoskeletal: No aggressive appearing focal osseous lesions. Mild thoracic spondylosis. CT ABDOMEN PELVIS FINDINGS Hepatobiliary: Liver surface appears finely irregular, cannot exclude cirrhosis. No liver masses. Normal gallbladder with no radiopaque cholelithiasis. No biliary ductal dilatation. Pancreas: Normal, with no mass or duct dilation. Spleen: Normal size. No mass. Adrenals/Urinary Tract: Normal adrenals. Subcentimeter hypodense lower right and interpolar left renal cortical lesions are too small to  characterize and require no follow-up. No suspicious renal masses. No hydronephrosis. Normal bladder. Stomach/Bowel: Normal non-distended stomach. Normal caliber small bowel with no small bowel wall thickening. Normal appendix. Normal large bowel with no diverticulosis, large bowel wall thickening or pericolonic fat stranding. Vascular/Lymphatic: Atherosclerotic nonaneurysmal abdominal aorta. Patent portal, splenic, hepatic and renal veins. No pathologically enlarged lymph nodes in the abdomen or pelvis. Reproductive: Grossly normal uterus.  No adnexal mass. Other: No pneumoperitoneum, ascites or focal fluid collection. Musculoskeletal: No aggressive appearing focal osseous lesions. Chronic moderate L2 vertebral compression fracture status post vertebroplasty. Mild lumbar spondylosis. IMPRESSION: 1. Irregular solid 3.8 cm central left lower lobe lung mass, new since 2010 comparison chest CT, highly suspicious for primary bronchogenic carcinoma. 2. Left infrahilar and subcarinal lymphadenopathy, suspicious for nodal metastases. 3. Multidisciplinary thoracic oncology consultation and PET-CT suggested. 4. Liver surface appears finely irregular, cannot exclude cirrhosis. No liver masses. Suggest correlation with liver function  tests. Consider outpatient hepatic elastography for further liver fibrosis risk stratification, as clinically warranted. 5. Three-vessel coronary atherosclerosis. 6. Aortic Atherosclerosis (ICD10-I70.0). Electronically Signed   By: Ilona Sorrel M.D.   On: 10/15/2020 15:30   DG CHEST PORT 1 VIEW  Result Date: 10/18/2020 CLINICAL DATA:  Status post bronchoscopy. EXAM: PORTABLE CHEST 1 VIEW COMPARISON:  September 30, 2020. FINDINGS: The heart size and mediastinal contours are within normal limits. Both lungs are clear. No pneumothorax or pleural effusion is noted. The visualized skeletal structures are unremarkable. IMPRESSION: No active disease. Aortic Atherosclerosis (ICD10-I70.0). Electronically Signed   By: Marijo Conception M.D.   On: 10/18/2020 12:41   DG Chest Port 1 View  Result Date: 09/30/2020 CLINICAL DATA:  Cough.  Patient reports syncope today. EXAM: PORTABLE CHEST 1 VIEW COMPARISON:  Chest radiograph 06/29/2020 FINDINGS: The cardiomediastinal contours are normal. Atherosclerosis of the aortic arch. Borderline hyperinflation which is similar to prior exam. Pulmonary vasculature is normal. No consolidation, pleural effusion, or pneumothorax. No acute osseous abnormalities are seen. IMPRESSION: No acute chest findings.  Stable borderline hyperinflation. Electronically Signed   By: Keith Rake M.D.   On: 09/30/2020 20:19   ECHOCARDIOGRAM COMPLETE  Result Date: 10/01/2020    ECHOCARDIOGRAM REPORT   Patient Name:   Patricia [name] Date of Exam: 10/01/2020 Medical Rec #:  [id number]       Height:       67.0 in Accession #:    [id number]      Weight:       182.1 lb Date of Birth:  31-May-1950        BSA:          1.943 m Patient Age:    17 years        BP:           150/82 mmHg Patient Gender: F               HR:           90 bpm. Exam Location:  Inpatient Procedure: 2D Echo, Color Doppler and Cardiac Doppler Indications:    R55 Syncope  History:        Patient has prior history of Echocardiogram  examinations, most                 recent 10/25/2017. Risk Factors:Hypertension and Dyslipidemia.  Sonographer:    Raquel Sarna Senior RDCS Referring Phys: [place]  1. Left ventricular ejection fraction, by estimation, is 60 to 65%. The left ventricle has normal function. The  left ventricle has no regional wall motion abnormalities. Left ventricular diastolic parameters are consistent with Grade I diastolic dysfunction (impaired relaxation).  2. Right ventricular systolic function is normal. The right ventricular size is normal.  3. Prominent epicardial fat.  4. The mitral valve is normal in structure. No evidence of mitral valve regurgitation. No evidence of mitral stenosis.  5. The aortic valve was not well visualized. There is mild calcification of the aortic valve. Aortic valve regurgitation is not visualized. Mild aortic valve sclerosis is present, with no evidence of aortic valve stenosis.  6. The inferior vena cava is normal in size with <50% respiratory variability, suggesting right atrial pressure of 8 mmHg. FINDINGS  Left Ventricle: Left ventricular ejection fraction, by estimation, is 60 to 65%. The left ventricle has normal function. The left ventricle has no regional wall motion abnormalities. The left ventricular internal cavity size was normal in size. There is  no left ventricular hypertrophy. Left ventricular diastolic parameters are consistent with Grade I diastolic dysfunction (impaired relaxation). Right Ventricle: The right ventricular size is normal. No increase in right ventricular wall thickness. Right ventricular systolic function is normal. Left Atrium: Left atrial size was normal in size. Right Atrium: Right atrial size was normal in size. Pericardium: Prominent epicardial fat. There is no evidence of pericardial effusion. Mitral Valve: The mitral valve is normal in structure. No evidence of mitral valve regurgitation. No evidence of mitral valve stenosis. Tricuspid  Valve: The tricuspid valve is normal in structure. Tricuspid valve regurgitation is not demonstrated. No evidence of tricuspid stenosis. Aortic Valve: The aortic valve was not well visualized. There is mild calcification of the aortic valve. Aortic valve regurgitation is not visualized. Mild aortic valve sclerosis is present, with no evidence of aortic valve stenosis. Pulmonic Valve: The pulmonic valve was normal in structure. Pulmonic valve regurgitation is not visualized. No evidence of pulmonic stenosis. Aorta: The aortic root is normal in size and structure. Venous: The inferior vena cava is normal in size with less than 50% respiratory variability, suggesting right atrial pressure of 8 mmHg. IAS/Shunts: No atrial level shunt detected by color flow Doppler.  LEFT VENTRICLE PLAX 2D LVIDd:         3.90 cm  Diastology LVIDs:         2.80 cm  LV e' medial:    7.40 cm/s LV PW:         1.00 cm  LV E/e' medial:  8.6 LV IVS:        0.80 cm  LV e' lateral:   6.31 cm/s LVOT diam:     2.00 cm  LV E/e' lateral: 10.0 LV SV:         57 LV SV Index:   29 LVOT Area:     3.14 cm  RIGHT VENTRICLE RV S prime:     11.70 cm/s TAPSE (M-mode): 2.6 cm LEFT ATRIUM             Index       RIGHT ATRIUM           Index LA diam:        2.20 cm 1.13 cm/m  RA Area:     11.30 cm LA Vol (A2C):   45.4 ml 23.36 ml/m RA Volume:   23.30 ml  11.99 ml/m LA Vol (A4C):   23.1 ml 11.89 ml/m LA Biplane Vol: 32.6 ml 16.78 ml/m  AORTIC VALVE LVOT Vmax:   91.40 cm/s LVOT Vmean:  62.800 cm/s LVOT  VTI:    0.181 m  AORTA Ao Root diam: 3.60 cm Ao Asc diam:  3.10 cm MITRAL VALVE MV Area (PHT): 4.33 cm    SHUNTS MV Decel Time: 175 msec    Systemic VTI:  0.18 m MV E velocity: 63.40 cm/s  Systemic Diam: 2.00 cm MV A velocity: 83.60 cm/s MV E/A ratio:  0.76 Jenkins Rouge MD Electronically signed by Jenkins Rouge MD Signature Date/Time: 10/01/2020/10:44:38 AM    Final    VAS US CAROTID  Result Date: 10/01/2020 Carotid Arterial Duplex Study Patient Name:   Patricia [name]  Date of Exam:   10/01/2020 Medical Rec #: [id number]           Accession #:    [id number] Date of Birth: 1950/05/13            Patient Gender: F Patient Age:   70 years Exam Location:  [facility] Procedure:      VAS US CAROTID Referring Phys: Everlene Farrier HONGALGI --------------------------------------------------------------------------------  Indications:      Syncope. Risk Factors:     Hypertension, hyperlipidemia, past history of smoking, prior                   CVA. Comparison Study: No prior studies. Performing Technologist: Darlin Coco RDMS, RVT  Examination Guidelines: A complete evaluation includes B-mode imaging, spectral Doppler, color Doppler, and power Doppler as needed of all accessible portions of each vessel. Bilateral testing is considered an integral part of a complete examination. Limited examinations for reoccurring indications may be performed as noted.  Right Carotid Findings: +----------+--------+--------+--------+-------------------+--------+           PSV cm/sEDV cm/sStenosisPlaque Description Comments +----------+--------+--------+--------+-------------------+--------+ CCA Prox  56      17                                          +----------+--------+--------+--------+-------------------+--------+ CCA Distal52      19                                          +----------+--------+--------+--------+-------------------+--------+ ICA Prox  52      26      1-39%   calcific and smooth         +----------+--------+--------+--------+-------------------+--------+ ICA Distal87      30                                          +----------+--------+--------+--------+-------------------+--------+ ECA       45      9                                           +----------+--------+--------+--------+-------------------+--------+ +----------+--------+-------+----------------+-------------------+           PSV cm/sEDV cmsDescribe        Arm  Pressure (mmHG) +----------+--------+-------+----------------+-------------------+ [id number]             Multiphasic, WNL                    +----------+--------+-------+----------------+-------------------+ +---------+--------+--+--------+--+---------+ VertebralPSV cm/s57EDV cm/s18Antegrade +---------+--------+--+--------+--+---------+  Left Carotid Findings: +----------+--------+--------+--------+----------------------+--------+  PSV cm/sEDV cm/sStenosisPlaque Description    Comments +----------+--------+--------+--------+----------------------+--------+ CCA Prox  76      18                                             +----------+--------+--------+--------+----------------------+--------+ CCA Distal66      18                                             +----------+--------+--------+--------+----------------------+--------+ ICA Prox  37      13      1-39%   focal and heterogenous         +----------+--------+--------+--------+----------------------+--------+ ICA Distal61      23                                             +----------+--------+--------+--------+----------------------+--------+ ECA       58                                                     +----------+--------+--------+--------+----------------------+--------+ +----------+--------+--------+----------------+-------------------+           PSV cm/sEDV cm/sDescribe        Arm Pressure (mmHG) +----------+--------+--------+----------------+-------------------+ [id number]              Multiphasic, WNL                    +----------+--------+--------+----------------+-------------------+ +---------+--------+--+--------+-+---------+ VertebralPSV cm/s39EDV cm/s9Antegrade +---------+--------+--+--------+-+---------+   Summary: Right Carotid: Velocities in the right ICA are consistent with a 1-39% stenosis. Left Carotid: Velocities in the left ICA are consistent with a  1-39% stenosis. Vertebrals:  Bilateral vertebral arteries demonstrate antegrade flow. Subclavians: Normal flow hemodynamics were seen in bilateral subclavian              arteries. *See table(s) above for measurements and observations.  Electronically signed by Servando Snare MD on 10/01/2020 at 5:26:53 PM.    Final    DG C-ARM BRONCHOSCOPY  Result Date: 10/18/2020 C-ARM BRONCHOSCOPY: Fluoroscopy was utilized by the requesting physician.  No radiographic interpretation.    ASSESSMENT: This is a very pleasant 70 years old white female recently diagnosed with limited stage (T2 a, N 2, M0) small cell lung cancer presented with left lower lobe lung mass in addition to left infrahilar and subcarinal lymphadenopathy diagnosed in October 2022.   PLAN: I had a lengthy discussion with the patient and her family today about her current disease stage, prognosis and treatment options. I personally and independently reviewed the scan images and discussed the results and showed the images to the patient and her husband. I recommended for the patient to complete the staging work-up by ordering a PET scan to rule out any other metastatic disease. If the patient has no evidence of metastatic disease outside the currently seen lesions, she will be treated with curative intention with a course of systemic chemotherapy with cisplatin 80 Mg/M2 on day 1 and 2 etoposide 100 Mg/M2 on days 1, 2 and 3 every 3 weeks.  This will  be concurrent with radiotherapy for 6 weeks during the course of the chemotherapy.  This will also be followed by prophylactic cranial irradiation if the patient has no disease progression. I discussed with the patient the adverse effect of this treatment including but not limited to alopecia, myelosuppression, nausea and vomiting, peripheral neuropathy, liver or renal dysfunction as well as hearing deficit. The patient is interested in proceeding with the treatment and she is expected to start the first  cycle of her treatment on October 28, 2020. If the PET scan showed any concerning findings for progression, I will modify her treatment by replacing cisplatin with carboplatin and adding immunotherapy. I will call her pharmacy with prescription for Compazine 10 mg p.o. every 6 hours as needed for nausea. For the hyponatremia, she has significant improvement of her serum sodium.  She is currently on fluid restriction as well as salt tablets.  She will continue this treatment for now until improvement of her disease with the chemotherapy. The patient will have a chemotherapy education class before the first dose of her treatment. She will come back for follow-up visit in 2 weeks for evaluation and management of any adverse effect of her chemotherapy. The patient was advised to call immediately if she has any other concerning symptoms in the interval.  The patient voices understanding of current disease status and treatment options and is in agreement with the current care plan.  All questions were answered. The patient knows to call the clinic with any problems, questions or concerns. We can certainly see the patient much sooner if necessary.  Thank you so much for allowing me to participate in the care of [facility]. I will continue to follow up the patient with you and assist in her care.  The total time spent in the appointment was 90 minutes.  Disclaimer: This note was dictated with voice recognition software. Similar sounding words can inadvertently be transcribed and may not be corrected upon review.   Eilleen Kempf October 21, 2020, 10:09 AM

## 2020-10-23 ENCOUNTER — Encounter: Payer: Self-pay | Admitting: Internal Medicine

## 2020-10-24 ENCOUNTER — Telehealth: Payer: Self-pay | Admitting: Internal Medicine

## 2020-10-24 DIAGNOSIS — C3492 Malignant neoplasm of unspecified part of left bronchus or lung: Secondary | ICD-10-CM | POA: Diagnosis not present

## 2020-10-24 DIAGNOSIS — E871 Hypo-osmolality and hyponatremia: Secondary | ICD-10-CM | POA: Diagnosis not present

## 2020-10-24 NOTE — Telephone Encounter (Signed)
Left message with follow-up appointments per 10/14 los.

## 2020-10-25 ENCOUNTER — Telehealth: Payer: Self-pay | Admitting: Internal Medicine

## 2020-10-25 DIAGNOSIS — C3490 Malignant neoplasm of unspecified part of unspecified bronchus or lung: Secondary | ICD-10-CM | POA: Diagnosis not present

## 2020-10-25 LAB — CYTOLOGY - NON PAP

## 2020-10-25 NOTE — Telephone Encounter (Signed)
Scheduled follow-up appointments per 10/14 los. Patient is aware.

## 2020-10-26 DIAGNOSIS — E871 Hypo-osmolality and hyponatremia: Secondary | ICD-10-CM | POA: Diagnosis not present

## 2020-10-26 NOTE — Progress Notes (Signed)
Pharmacist Chemotherapy Monitoring - Initial Assessment    Anticipated start date: 10/31/20   The following has been reviewed per standard work regarding the patient's treatment regimen: The patient's diagnosis, treatment plan and drug doses, and organ/hematologic function Lab orders and baseline tests specific to treatment regimen  The treatment plan start date, drug sequencing, and pre-medications Prior authorization status  Patient's documented medication list, including drug-drug interaction screen and prescriptions for anti-emetics and supportive care specific to the treatment regimen The drug concentrations, fluid compatibility, administration routes, and timing of the medications to be used The patient's access for treatment and lifetime cumulative dose history, if applicable  The patient's medication allergies and previous infusion related reactions, if applicable   Changes made to treatment plan:  N/A  Follow up needed:  N/A   Patricia [name], [place], 10/26/2020  1:05 PM

## 2020-10-27 ENCOUNTER — Other Ambulatory Visit: Payer: Self-pay

## 2020-10-27 ENCOUNTER — Inpatient Hospital Stay: Payer: Medicare HMO

## 2020-10-27 DIAGNOSIS — C3432 Malignant neoplasm of lower lobe, left bronchus or lung: Secondary | ICD-10-CM

## 2020-10-27 MED ORDER — PROCHLORPERAZINE MALEATE 10 MG PO TABS: 10.0000 mg | ORAL_TABLET | Freq: Four times a day (QID) | ORAL | 0 refills | Status: DC | PRN

## 2020-10-31 ENCOUNTER — Other Ambulatory Visit: Payer: Self-pay

## 2020-10-31 ENCOUNTER — Inpatient Hospital Stay: Payer: Medicare HMO

## 2020-10-31 VITALS — BP 142/77 | HR 59 | Temp 97.7°F | Resp 17

## 2020-10-31 DIAGNOSIS — C3432 Malignant neoplasm of lower lobe, left bronchus or lung: Secondary | ICD-10-CM

## 2020-10-31 DIAGNOSIS — E871 Hypo-osmolality and hyponatremia: Secondary | ICD-10-CM | POA: Diagnosis not present

## 2020-10-31 DIAGNOSIS — Z5111 Encounter for antineoplastic chemotherapy: Secondary | ICD-10-CM | POA: Diagnosis not present

## 2020-10-31 LAB — CMP (CANCER CENTER ONLY)
ALT: 28 U/L (ref 0–44)
AST: 32 U/L (ref 15–41)
Albumin: 4.1 g/dL (ref 3.5–5.0)
Alkaline Phosphatase: 69 U/L (ref 38–126)
Anion gap: 7 (ref 5–15)
BUN: 12 mg/dL (ref 8–23)
CO2: 29 mmol/L (ref 22–32)
Calcium: 9.4 mg/dL (ref 8.9–10.3)
Chloride: 104 mmol/L (ref 98–111)
Creatinine: 0.75 mg/dL (ref 0.44–1.00)
GFR, Estimated: >60 mL/min (ref >60)
Glucose, Bld: 87 mg/dL (ref 70–99)
Potassium: 4.1 mmol/L (ref 3.5–5.1)
Sodium: 140 mmol/L (ref 135–145)
Total Bilirubin: 0.3 mg/dL (ref 0.3–1.2)
Total Protein: 6.4 g/dL — ABNORMAL LOW (ref 6.5–8.1)

## 2020-10-31 LAB — CBC WITH DIFFERENTIAL (CANCER CENTER ONLY)
Abs Immature Granulocytes: 0.02 10*3/uL (ref 0.00–0.07)
Basophils Absolute: 0 10*3/uL (ref 0.0–0.1)
Basophils Relative: 1 %
Eosinophils Absolute: 0.1 10*3/uL (ref 0.0–0.5)
Eosinophils Relative: 2 %
HCT: 38.5 % (ref 36.0–46.0)
Hemoglobin: 13.2 g/dL (ref 12.0–15.0)
Immature Granulocytes: 0 %
Lymphocytes Relative: 15 %
Lymphs Abs: 1 10*3/uL (ref 0.7–4.0)
MCH: 34.6 pg — ABNORMAL HIGH (ref 26.0–34.0)
MCHC: 34.3 g/dL (ref 30.0–36.0)
MCV: 101 fL — ABNORMAL HIGH (ref 80.0–100.0)
Monocytes Absolute: 0.4 10*3/uL (ref 0.1–1.0)
Monocytes Relative: 6 %
Neutro Abs: 4.7 10*3/uL (ref 1.7–7.7)
Neutrophils Relative %: 76 %
Platelet Count: 224 10*3/uL (ref 150–400)
RBC: 3.81 MIL/uL — ABNORMAL LOW (ref 3.87–5.11)
RDW: 14.6 % (ref 11.5–15.5)
WBC Count: 6.2 10*3/uL (ref 4.0–10.5)
nRBC: 0 % (ref 0.0–0.2)

## 2020-10-31 LAB — MAGNESIUM: Magnesium: 1.6 mg/dL — ABNORMAL LOW (ref 1.7–2.4)

## 2020-10-31 MED ORDER — SODIUM CHLORIDE 0.9 % IV SOLN
100.0000 mg/m2 | Freq: Once | INTRAVENOUS | Status: AC
  Administered 2020-10-31: 200 mg via INTRAVENOUS
  Filled 2020-10-31: qty 10

## 2020-10-31 MED ORDER — POTASSIUM CHLORIDE IN NACL 20-0.9 MEQ/L-% IV SOLN
Freq: Once | INTRAVENOUS | Status: AC
  Filled 2020-10-31: qty 1000

## 2020-10-31 MED ORDER — CISPLATIN CHEMO INJECTION 100MG/100ML
80.0000 mg/m2 | Freq: Once | INTRAVENOUS | Status: AC
  Administered 2020-10-31: 160 mg via INTRAVENOUS
  Filled 2020-10-31: qty 100

## 2020-10-31 MED ORDER — MAGNESIUM SULFATE 2 GM/50ML IV SOLN
2.0000 g | Freq: Once | INTRAVENOUS | Status: AC
  Administered 2020-10-31: 2 g via INTRAVENOUS
  Filled 2020-10-31: qty 50

## 2020-10-31 MED ORDER — SODIUM CHLORIDE 0.9 % IV SOLN: Freq: Once | INTRAVENOUS | Status: AC

## 2020-10-31 MED ORDER — SODIUM CHLORIDE 0.9 % IV SOLN
150.0000 mg | Freq: Once | INTRAVENOUS | Status: AC
  Administered 2020-10-31: 150 mg via INTRAVENOUS
  Filled 2020-10-31: qty 150

## 2020-10-31 MED ORDER — PALONOSETRON HCL INJECTION 0.25 MG/5ML
0.2500 mg | Freq: Once | INTRAVENOUS | Status: AC
  Administered 2020-10-31: 0.25 mg via INTRAVENOUS
  Filled 2020-10-31: qty 5

## 2020-10-31 MED ORDER — SODIUM CHLORIDE 0.9 % IV SOLN
10.0000 mg | Freq: Once | INTRAVENOUS | Status: AC
  Administered 2020-10-31: 10 mg via INTRAVENOUS
  Filled 2020-10-31: qty 10

## 2020-10-31 NOTE — Patient Instructions (Signed)
[facility] [place]  Discharge Instructions: Thank you for choosing [facility] to provide your oncology and hematology care.   If you have a lab appointment with the [facility], please go directly to the [facility] and check in at the registration area.  Wear comfortable clothing and clothing appropriate for easy access to any Portacath or PICC line.   We strive to give you quality time with your provider. You may need to reschedule your appointment if you arrive late (15 or more minutes).  Arriving late affects you and other patients whose appointments are after yours.  Also, if you miss three or more appointments without notifying the office, you may be dismissed from the clinic at the provider's discretion.      For prescription refill requests, have your pharmacy contact our office and allow 72 hours for refills to be completed.    Today you received the following chemotherapy and/or immunotherapy agents Cisplatin and Etoposide      To help prevent nausea and vomiting after your treatment, we encourage you to take your nausea medication as directed.  BELOW ARE SYMPTOMS THAT SHOULD BE REPORTED IMMEDIATELY: *FEVER GREATER THAN 100.4 F (38 C) OR HIGHER *CHILLS OR SWEATING *NAUSEA AND VOMITING THAT IS NOT CONTROLLED WITH YOUR NAUSEA MEDICATION *UNUSUAL SHORTNESS OF BREATH *UNUSUAL BRUISING OR BLEEDING *URINARY PROBLEMS (pain or burning when urinating, or frequent urination) *BOWEL PROBLEMS (unusual diarrhea, constipation, pain near the anus) TENDERNESS IN MOUTH AND THROAT WITH OR WITHOUT PRESENCE OF ULCERS (sore throat, sores in mouth, or a toothache) UNUSUAL RASH, SWELLING OR PAIN  UNUSUAL VAGINAL DISCHARGE OR ITCHING   Items with * indicate a potential emergency and should be followed up as soon as possible or go to the Emergency Department if any problems should occur.  Please show the CHEMOTHERAPY ALERT CARD or IMMUNOTHERAPY ALERT CARD at  check-in to the Emergency Department and triage nurse. Should you have questions after your visit or need to cancel or reschedule your appointment, please contact [facility]  [phone] and follow the prompts.  Office hours are 8:00 a.m. to 4:30 p.m. Monday - Friday. Please note that voicemails left after 4:00 p.m. may not be returned until the following business day.  We are closed weekends and major holidays. You have access to a nurse at all times for urgent questions. Please call the main number to the clinic [phone] and follow the prompts.  For any non-urgent questions, you may also contact your provider using MyChart. We now offer e-Visits for anyone 2 and older to request care online for non-urgent symptoms. For details visit [email or web address].   Also download the MyChart app! Go to the app store, search "MyChart", open the app, select [facility], and log in with your MyChart username and password.  Due to Covid, a mask is required upon entering the hospital/clinic. If you do not have a mask, one will be given to you upon arrival. For doctor visits, patients may have 1 support person aged 55 or older with them. For treatment visits, patients cannot have anyone with them due to current Covid guidelines and our immunocompromised population.

## 2020-10-31 NOTE — Progress Notes (Signed)
Per Dr. Julien Nordmann -VO-Ok to run Hydration fluids with cisplatin.

## 2020-11-01 ENCOUNTER — Inpatient Hospital Stay: Payer: Medicare HMO

## 2020-11-01 VITALS — BP 155/55 | HR 56 | Temp 97.8°F | Resp 18

## 2020-11-01 DIAGNOSIS — Z5111 Encounter for antineoplastic chemotherapy: Secondary | ICD-10-CM | POA: Diagnosis not present

## 2020-11-01 DIAGNOSIS — E871 Hypo-osmolality and hyponatremia: Secondary | ICD-10-CM | POA: Diagnosis not present

## 2020-11-01 DIAGNOSIS — C3432 Malignant neoplasm of lower lobe, left bronchus or lung: Secondary | ICD-10-CM

## 2020-11-01 MED ORDER — SODIUM CHLORIDE 0.9 % IV SOLN
10.0000 mg | Freq: Once | INTRAVENOUS | Status: AC
  Administered 2020-11-01: 10 mg via INTRAVENOUS
  Filled 2020-11-01: qty 10

## 2020-11-01 MED ORDER — SODIUM CHLORIDE 0.9 % IV SOLN
100.0000 mg/m2 | Freq: Once | INTRAVENOUS | Status: AC
  Administered 2020-11-01: 200 mg via INTRAVENOUS
  Filled 2020-11-01: qty 10

## 2020-11-01 MED ORDER — SODIUM CHLORIDE 0.9 % IV SOLN: Freq: Once | INTRAVENOUS | Status: AC

## 2020-11-01 NOTE — Patient Instructions (Signed)
[facility]  Discharge Instructions: Thank you for choosing [facility] to provide your oncology and hematology care.   If you have a lab appointment with the [facility], please go directly to the [facility] and check in at the registration area.  Wear comfortable clothing and clothing appropriate for easy access to any Portacath or PICC line.   We strive to give you quality time with your provider. You may need to reschedule your appointment if you arrive late (15 or more minutes).  Arriving late affects you and other patients whose appointments are after yours.  Also, if you miss three or more appointments without notifying the office, you may be dismissed from the clinic at the provider's discretion.      For prescription refill requests, have your pharmacy contact our office and allow 72 hours for refills to be completed.    Today you received the following chemotherapy and/or immunotherapy agents Etoposife.   To help prevent nausea and vomiting after your treatment, we encourage you to take your nausea medication as directed.  BELOW ARE SYMPTOMS THAT SHOULD BE REPORTED IMMEDIATELY: *FEVER GREATER THAN 100.4 F (38 C) OR HIGHER *CHILLS OR SWEATING *NAUSEA AND VOMITING THAT IS NOT CONTROLLED WITH YOUR NAUSEA MEDICATION *UNUSUAL SHORTNESS OF BREATH *UNUSUAL BRUISING OR BLEEDING *URINARY PROBLEMS (pain or burning when urinating, or frequent urination) *BOWEL PROBLEMS (unusual diarrhea, constipation, pain near the anus) TENDERNESS IN MOUTH AND THROAT WITH OR WITHOUT PRESENCE OF ULCERS (sore throat, sores in mouth, or a toothache) UNUSUAL RASH, SWELLING OR PAIN  UNUSUAL VAGINAL DISCHARGE OR ITCHING   Items with * indicate a potential emergency and should be followed up as soon as possible or go to the Emergency Department if any problems should occur.  Please show the CHEMOTHERAPY ALERT CARD or IMMUNOTHERAPY ALERT CARD at check-in to the  Emergency Department and triage nurse. Should you have questions after your visit or need to cancel or reschedule your appointment, please contact [facility]  [phone] and follow the prompts.  Office hours are 8:00 a.m. to 4:30 p.m. Monday - Friday. Please note that voicemails left after 4:00 p.m. may not be returned until the following business day.  We are closed weekends and major holidays. You have access to a nurse at all times for urgent questions. Please call the main number to the clinic [phone] and follow the prompts.  For any non-urgent questions, you may also contact your provider using MyChart. We now offer e-Visits for anyone 16 and older to request care online for non-urgent symptoms. For details visit [email or web address].   Also download the MyChart app! Go to the app store, search "MyChart", open the app, select [facility], and log in with your MyChart username and password.  Due to Covid, a mask is required upon entering the hospital/clinic. If you do not have a mask, one will be given to you upon arrival. For doctor visits, patients may have 1 support person aged 67 or older with them. For treatment visits, patients cannot have anyone with them due to current Covid guidelines and our immunocompromised population.

## 2020-11-02 ENCOUNTER — Other Ambulatory Visit: Payer: Self-pay

## 2020-11-02 ENCOUNTER — Inpatient Hospital Stay: Payer: Medicare HMO

## 2020-11-02 VITALS — BP 179/93 | HR 60 | Temp 98.3°F | Resp 20

## 2020-11-02 DIAGNOSIS — C771 Secondary and unspecified malignant neoplasm of intrathoracic lymph nodes: Secondary | ICD-10-CM | POA: Diagnosis not present

## 2020-11-02 DIAGNOSIS — Z5111 Encounter for antineoplastic chemotherapy: Secondary | ICD-10-CM | POA: Diagnosis not present

## 2020-11-02 DIAGNOSIS — I10 Essential (primary) hypertension: Secondary | ICD-10-CM | POA: Diagnosis not present

## 2020-11-02 DIAGNOSIS — C3432 Malignant neoplasm of lower lobe, left bronchus or lung: Secondary | ICD-10-CM

## 2020-11-02 DIAGNOSIS — E871 Hypo-osmolality and hyponatremia: Secondary | ICD-10-CM | POA: Diagnosis not present

## 2020-11-02 MED ORDER — SODIUM CHLORIDE 0.9 % IV SOLN: Freq: Once | INTRAVENOUS | Status: AC

## 2020-11-02 MED ORDER — SODIUM CHLORIDE 0.9 % IV SOLN
10.0000 mg | Freq: Once | INTRAVENOUS | Status: AC
  Administered 2020-11-02: 10 mg via INTRAVENOUS
  Filled 2020-11-02: qty 10

## 2020-11-02 MED ORDER — SODIUM CHLORIDE 0.9 % IV SOLN
100.0000 mg/m2 | Freq: Once | INTRAVENOUS | Status: AC
  Administered 2020-11-02: 200 mg via INTRAVENOUS
  Filled 2020-11-02: qty 10

## 2020-11-02 NOTE — Patient Instructions (Signed)
[facility]  Discharge Instructions: Thank you for choosing [facility] to provide your oncology and hematology care.   If you have a lab appointment with the [facility], please go directly to the [facility] and check in at the registration area.  Wear comfortable clothing and clothing appropriate for easy access to any Portacath or PICC line.   We strive to give you quality time with your provider. You may need to reschedule your appointment if you arrive late (15 or more minutes).  Arriving late affects you and other patients whose appointments are after yours.  Also, if you miss three or more appointments without notifying the office, you may be dismissed from the clinic at the provider's discretion.      For prescription refill requests, have your pharmacy contact our office and allow 72 hours for refills to be completed.    Today you received the following chemotherapy and/or immunotherapy agents Etoposide.   To help prevent nausea and vomiting after your treatment, we encourage you to take your nausea medication as directed.  BELOW ARE SYMPTOMS THAT SHOULD BE REPORTED IMMEDIATELY: *FEVER GREATER THAN 100.4 F (38 C) OR HIGHER *CHILLS OR SWEATING *NAUSEA AND VOMITING THAT IS NOT CONTROLLED WITH YOUR NAUSEA MEDICATION *UNUSUAL SHORTNESS OF BREATH *UNUSUAL BRUISING OR BLEEDING *URINARY PROBLEMS (pain or burning when urinating, or frequent urination) *BOWEL PROBLEMS (unusual diarrhea, constipation, pain near the anus) TENDERNESS IN MOUTH AND THROAT WITH OR WITHOUT PRESENCE OF ULCERS (sore throat, sores in mouth, or a toothache) UNUSUAL RASH, SWELLING OR PAIN  UNUSUAL VAGINAL DISCHARGE OR ITCHING   Items with * indicate a potential emergency and should be followed up as soon as possible or go to the Emergency Department if any problems should occur.  Please show the CHEMOTHERAPY ALERT CARD or IMMUNOTHERAPY ALERT CARD at check-in to the  Emergency Department and triage nurse. Should you have questions after your visit or need to cancel or reschedule your appointment, please contact [facility]  [phone] and follow the prompts.  Office hours are 8:00 a.m. to 4:30 p.m. Monday - Friday. Please note that voicemails left after 4:00 p.m. may not be returned until the following business day.  We are closed weekends and major holidays. You have access to a nurse at all times for urgent questions. Please call the main number to the clinic [phone] and follow the prompts.  For any non-urgent questions, you may also contact your provider using MyChart. We now offer e-Visits for anyone 77 and older to request care online for non-urgent symptoms. For details visit [email or web address].   Also download the MyChart app! Go to the app store, search "MyChart", open the app, select [facility], and log in with your MyChart username and password.  Due to Covid, a mask is required upon entering the hospital/clinic. If you do not have a mask, one will be given to you upon arrival. For doctor visits, patients may have 1 support person aged 54 or older with them. For treatment visits, patients cannot have anyone with them due to current Covid guidelines and our immunocompromised population.

## 2020-11-03 ENCOUNTER — Telehealth: Payer: Self-pay | Admitting: Medical Oncology

## 2020-11-03 ENCOUNTER — Ambulatory Visit: Payer: Medicare HMO | Admitting: Cardiology

## 2020-11-03 ENCOUNTER — Encounter: Payer: Self-pay | Admitting: Cardiology

## 2020-11-03 VITALS — BP 118/68 | HR 55 | Ht 66.0 in | Wt 192.0 lb

## 2020-11-03 DIAGNOSIS — I471 Supraventricular tachycardia: Secondary | ICD-10-CM | POA: Diagnosis not present

## 2020-11-03 NOTE — Progress Notes (Signed)
Electrophysiology Office Note   Date:  11/03/2020   ID:  Suheily, Birks 1950/09/19, MRN [id number]  PCP:  Patricia Frees, MD  Cardiologist:   Primary Electrophysiologist:  Kaileia Flow Meredith Leeds, MD    Chief Complaint: SVT   History of Present Illness: Patricia [name] is a 70 y.o. female who is being seen today for the evaluation of SVT at the request of Patricia Frees, MD. Presenting today for electrophysiology evaluation.  He has a history significant for hypertension, hyperlipidemia, TIA, IBS, SVT.  She wore a cardiac monitor that showed episodes of SVT.  She presented emergency room 12/14/2019 and was treated with adenosine.  Historically she has tried vagal maneuvers.  Her symptoms are lightheadedness, numbness in her hands and feet.  Her cardiac monitor also showed a 2% atrial fibrillation burden she is now on Eliquis.  She presented to the hospital September 22 with episode of syncope.  It was thought her syncope was due to a dehydration/hypovolemia from dehydration.  She continued to have short episodes of SVT, but was asymptomatic from this.  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  She had a biopsy of her cancer at that time.  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  She has been since been started on chemotherapy.  Today, denies symptoms of palpitations, chest pain, shortness of breath, orthopnea, PND, lower extremity edema, claudication, dizziness, presyncope, syncope, bleeding, or neurologic sequela. The patient is tolerating medications without difficulties.  She feels well today.  She started chemotherapy this week.  Her main complaint is a sore throat, but she is having no other issues.  She has intermittent palpitations, but she is overall comfortable with her control.   Past Medical History:  Diagnosis Date   Anxiety    GERD (gastroesophageal reflux disease)     Hyperlipidemia    Hypertension    IBS (irritable bowel syndrome)    Insomnia    Low back pain    TIA (transient ischemic attack) 10/2017   no deficits   Vitamin B12 deficiency    Vitamin D deficiency    Past Surgical History:  Procedure Laterality Date   BACK SURGERY     BREAST EXCISIONAL BIOPSY Left 1997   benign   BRONCHIAL BIOPSY  10/18/2020   Procedure: BRONCHIAL BIOPSIES;  Surgeon: [name], DO;  Location: [facility];  Service: Pulmonary;;   BRONCHIAL BRUSHINGS  10/18/2020   Procedure: BRONCHIAL BRUSHINGS;  Surgeon: [name], DO;  Location: [facility];  Service: Pulmonary;;   BRONCHIAL NEEDLE ASPIRATION BIOPSY  10/18/2020   Procedure: BRONCHIAL NEEDLE ASPIRATION BIOPSIES;  Surgeon: [name], DO;  Location: [facility];  Service: Pulmonary;;   BRONCHIAL WASHINGS  10/18/2020   Procedure: BRONCHIAL WASHINGS;  Surgeon: [name], DO;  Location: [facility];  Service: Pulmonary;;   hemorrhoidecotmy     HERNIA MESH REMOVAL     OPEN REDUCTION INTERNAL FIXATION (ORIF) DISTAL RADIAL FRACTURE Right 11/19/2019   Procedure: OPEN REDUCTION INTERNAL FIXATION (ORIF) DISTAL RADIUS AND ULNA FRACTURE WITH REPAIR AS NECESSARY;  Surgeon: Roseanne Kaufman, MD;  Location: [place];  Service: Orthopedics;  Laterality: Right;  2 hrs Block with IV sedation   ORIF RADIUS & ULNA FRACTURES     TONSILLECTOMY     VIDEO BRONCHOSCOPY WITH ENDOBRONCHIAL NAVIGATION Left 10/18/2020   Procedure: VIDEO BRONCHOSCOPY WITH ENDOBRONCHIAL NAVIGATION;  Surgeon: [name], DO;  Location: [facility];  Service: Pulmonary;  Laterality: Left;  ION   VIDEO BRONCHOSCOPY WITH ENDOBRONCHIAL ULTRASOUND Bilateral 10/18/2020   Procedure: VIDEO BRONCHOSCOPY WITH ENDOBRONCHIAL ULTRASOUND;  Surgeon: [name], DO;  Location: [place];  Service: Pulmonary;  Laterality: Bilateral;   VIDEO BRONCHOSCOPY WITH RADIAL ENDOBRONCHIAL ULTRASOUND  10/18/2020   Procedure: VIDEO BRONCHOSCOPY  WITH RADIAL ENDOBRONCHIAL ULTRASOUND;  Surgeon: [name], DO;  Location: [facility];  Service: Pulmonary;;     Current Outpatient Medications  Medication Sig Dispense Refill   acetaminophen (TYLENOL) 325 MG tablet 1 tablet as needed.     budesonide-formoterol (SYMBICORT) 80-4.5 MCG/ACT inhaler Take 2 puffs first thing in am and then another 2 puffs about 12 hours later. 1 each 12   carboxymethylcellulose (REFRESH PLUS) 0.5 % SOLN Place 1 drop into both eyes 3 (three) times daily as needed (for dryness).      carisoprodol (SOMA) 350 MG tablet Take 350 mg by mouth 3 (three) times daily.   5   Cholecalciferol (VITAMIN D3) 50 MCG (2000 UT) TABS Take 2,000 Units by mouth daily.     clobetasol ointment (TEMOVATE) 2.42 % Apply 1 application topically See admin instructions. Apply to vaginal area daily as directed     clonazePAM (KLONOPIN) 1 MG tablet Take 1 mg by mouth at bedtime.   3   ELIQUIS 5 MG TABS tablet TAKE 1 TABLET(5 MG) BY MOUTH TWICE DAILY 60 tablet 6   fluticasone (FLONASE) 50 MCG/ACT nasal spray Place 2 sprays into both nostrils daily as needed for allergies or rhinitis.   5   furosemide (LASIX) 20 MG tablet Take 1 tablet (20 mg total) by mouth daily. 30 tablet 2   gabapentin (NEURONTIN) 600 MG tablet Take 600 mg by mouth See admin instructions. Take 600 mg by mouth in the morning and at lunchtime     hyoscyamine (LEVSIN) 0.125 MG tablet Take 0.125 mg by mouth as needed.     linaclotide (LINZESS) 145 MCG CAPS capsule Take 1 capsule (145 mcg total) by mouth daily at 4 PM. 30 capsule 0   metoprolol tartrate (LOPRESSOR) 50 MG tablet Take 1 tablet (50 mg total) by mouth 2 (two) times daily. 60 tablet 0   omeprazole (PRILOSEC) 20 MG capsule Take 20 mg by mouth daily before breakfast.      ondansetron (ZOFRAN) 4 MG tablet Take 1 tablet (4 mg total) by mouth every 8 (eight) hours as needed for nausea or vomiting. 30 tablet 1   ondansetron (ZOFRAN-ODT) 8 MG disintegrating tablet Take 8  mg by mouth 3 (three) times daily.     polyethylene glycol (MIRALAX / GLYCOLAX) packet Take 34 g by mouth in the morning.      prochlorperazine (COMPAZINE) 10 MG tablet Take 1 tablet (10 mg total) by mouth every 6 (six) hours as needed for nausea or vomiting. 30 tablet 0   rosuvastatin (CRESTOR) 5 MG tablet Take 5 mg by mouth 3 (three) times a week. Mondays Wednesdays Fridays     sodium chloride 1 g tablet Take 2 tablets (2 g total) by mouth 3 (three) times daily with meals. (Patient taking differently: Take 1 g by mouth 3 (three) times daily with meals.) 180 tablet 0   traMADol (ULTRAM) 50 MG tablet Take 100 mg by mouth as needed.     VENTOLIN HFA 108 (90 Base) MCG/ACT inhaler Inhale 2 puffs into the lungs every 6 (six) hours as needed for wheezing or shortness of breath.   5  zaleplon (SONATA) 10 MG capsule Take 10 mg by mouth at bedtime as needed (for interrupted sleep).      No current facility-administered medications for this visit.    Allergies:   Augmentin [amoxicillin-pot clavulanate], Levaquin [levofloxacin in d5w], Mom [magnesium hydroxide], and Oxycodone   Social History:  The patient  reports that she quit smoking about 10 months ago. Her smoking use included cigarettes. She started smoking about 50 years ago. She has a 50.00 pack-year smoking history. She has never used smokeless tobacco. She reports current alcohol use. She reports that she does not use drugs.   Family History:  The patient's family history includes Breast cancer in her maternal aunt, paternal aunt, and sister.   ROS:  Please see the history of present illness.   Otherwise, review of systems is positive for none.   All other systems are reviewed and negative.   PHYSICAL EXAM: VS:  BP 118/68   Pulse (!) 55   Ht 5\' 6"  (1.676 m)   Wt 192 lb (87.1 kg)   LMP  (LMP Unknown)   SpO2 94%   BMI 30.99 kg/m  , BMI Body mass index is 30.99 kg/m. GEN: Well nourished, well developed, in no acute distress  HEENT:  normal  Neck: no JVD, carotid bruits, or masses Cardiac: RRR; no murmurs, rubs, or gallops,no edema  Respiratory:  clear to auscultation bilaterally, normal work of breathing GI: soft, nontender, nondistended, + BS MS: no deformity or atrophy  Skin: warm and dry Neuro:  Strength and sensation are intact Psych: euthymic mood, full affect  EKG:  EKG is not ordered today. Personal review of the ekg ordered 10/14/20 shows sinus rhythm   Recent Labs: 10/14/2020: TSH 2.021 10/31/2020: ALT 28; BUN 12; Creatinine 0.75; Hemoglobin 13.2; Magnesium 1.6; Platelet Count 224; Potassium 4.1; Sodium 140    Lipid Panel  No results found for: CHOL, TRIG, HDL, CHOLHDL, VLDL, LDLCALC, LDLDIRECT   Wt Readings from Last 3 Encounters:  11/03/20 192 lb (87.1 kg)  10/21/20 185 lb 11.2 oz (84.2 kg)  10/18/20 182 lb 15.7 oz (83 kg)      Other studies Reviewed: Additional studies/ records that were reviewed today include: TTE 2019  Review of the above records today demonstrates:  - Left ventricle: The cavity size was normal. Systolic function was    normal. The estimated ejection fraction was in the range of 55%    to 60%. Wall motion was normal; there were no regional wall    motion abnormalities. Doppler parameters are consistent with    abnormal left ventricular relaxation (grade 1 diastolic    dysfunction). There was no evidence of elevated ventricular    filling pressure by Doppler parameters.  - Aortic valve: There was no regurgitation.  - Mitral valve: There was trivial regurgitation.  - Right ventricle: The cavity size was normal. Wall thickness was    normal. Systolic function was normal.  - Tricuspid valve: There was mild regurgitation.  - Pulmonary arteries: Systolic pressure was within the normal    range.  - Inferior vena cava: The vessel was normal in size.  - Pericardium, extracardiac: There was no pericardial effusion.   Monitor 01/04/20 Max 234 bpm 02:47pm, 12/01 Min 46 bpm  11:11pm, 12/07 Avg 76 bpm Less than 1% ventricular and supraventricular ectopy The predominant underlying rhythm was sinus rhythm 2% atrial fibrillation burden with heart rates of 107 to 234 bpm Triggered events associated with both atrial fibrillation and SVT.   ASSESSMENT  AND PLAN:  1.  SVT: Currently on metoprolol 50 mg twice daily.  She is currently feeling well.  She is having minimal SVT symptoms.  2.  Hypertension: Currently well controlled  3.  Paroxysmal atrial fibrillation: 2% burden on noted on cardiac monitor.  CHA2DS2-VASc of 5.  Currently Eliquis 5 mg twice daily.  4.  Small cell lung cancer: Currently undergoing therapy with Dr. Julien Nordmann.  Has started chemo.  Current medicines are reviewed at length with the patient today.   The patient does not have concerns regarding her medicines.  The following changes were made today: None  Labs/ tests ordered today include:  No orders of the defined types were placed in this encounter.    Disposition:   FU with Patricia [name] 12 months  Signed, Rakeem Colley Meredith Leeds, MD  11/03/2020 10:48 AM     [facility] [address] [place] [address] [phone] (office) [phone] (fax)

## 2020-11-03 NOTE — Patient Instructions (Addendum)
Medication Instructions:  Your physician recommends that you continue on your current medications as directed. Please refer to the Current Medication list given to you today.  *If you need a refill on your cardiac medications before your next appointment, please call your pharmacy*   Lab Work: None ordered If you have labs (blood work) drawn today and your tests are completely normal, you will receive your results only by: [place] (if you have MyChart) OR A paper copy in the mail If you have any lab test that is abnormal or we need to change your treatment, we will call you to review the results.   Testing/Procedures: None ordered   Follow-Up: At [facility], you and your health needs are our priority.  As part of our continuing mission to provide you with exceptional heart care, we have created designated Provider Care Teams.  These Care Teams include your primary Cardiologist (physician) and Advanced Practice Providers (APPs -  Physician Assistants and Nurse Practitioners) who all work together to provide you with the care you need, when you need it.  We recommend signing up for the patient portal called "MyChart".  Sign up information is provided on this After Visit Summary.  MyChart is used to connect with patients for Virtual Visits (Telemedicine).  Patients are able to view lab/test results, encounter notes, upcoming appointments, etc.  Non-urgent messages can be sent to your provider as well.   To learn more about what you can do with MyChart, go to [email or web address].    Your next appointment:   1 year(s)  The format for your next appointment:   In Person  Provider:   Allegra Lai, MD    Thank you for choosing [facility]!!   Trinidad Curet, RN [phone]               Other Instructions  [place] for Eliquis:  [phone]

## 2020-11-03 NOTE — Telephone Encounter (Signed)
Pt reports she has possible yeast infection in mouth.  Fluconazole 100 mg ( 3 tabs) prescribed by Dr Melvyn Novas . Pt asking is it ok to take. I recommended she follow Dr Morrison Old instructions to take the medication.

## 2020-11-04 ENCOUNTER — Encounter (HOSPITAL_COMMUNITY)
Admission: RE | Admit: 2020-11-04 | Discharge: 2020-11-04 | Disposition: A | Discharge status: Home / Self Care | Payer: Medicare HMO | Source: Ambulatory Visit | Attending: Pulmonary Disease | Admitting: Pulmonary Disease

## 2020-11-04 ENCOUNTER — Other Ambulatory Visit: Payer: Self-pay

## 2020-11-04 ENCOUNTER — Other Ambulatory Visit: Payer: Self-pay | Admitting: Medical Oncology

## 2020-11-04 ENCOUNTER — Inpatient Hospital Stay (HOSPITAL_BASED_OUTPATIENT_CLINIC_OR_DEPARTMENT_OTHER): Payer: Medicare HMO | Admitting: Physician Assistant

## 2020-11-04 ENCOUNTER — Telehealth: Payer: Self-pay | Admitting: Medical Oncology

## 2020-11-04 VITALS — BP 159/88 | HR 70 | Temp 98.5°F | Resp 17 | Ht 66.0 in | Wt 188.0 lb

## 2020-11-04 DIAGNOSIS — J439 Emphysema, unspecified: Secondary | ICD-10-CM | POA: Diagnosis not present

## 2020-11-04 DIAGNOSIS — E871 Hypo-osmolality and hyponatremia: Secondary | ICD-10-CM | POA: Diagnosis not present

## 2020-11-04 DIAGNOSIS — C3432 Malignant neoplasm of lower lobe, left bronchus or lung: Secondary | ICD-10-CM

## 2020-11-04 DIAGNOSIS — B370 Candidal stomatitis: Secondary | ICD-10-CM

## 2020-11-04 DIAGNOSIS — C3490 Malignant neoplasm of unspecified part of unspecified bronchus or lung: Secondary | ICD-10-CM | POA: Diagnosis not present

## 2020-11-04 DIAGNOSIS — I2510 Atherosclerotic heart disease of native coronary artery without angina pectoris: Secondary | ICD-10-CM | POA: Diagnosis not present

## 2020-11-04 DIAGNOSIS — I6523 Occlusion and stenosis of bilateral carotid arteries: Secondary | ICD-10-CM | POA: Diagnosis not present

## 2020-11-04 DIAGNOSIS — Z5111 Encounter for antineoplastic chemotherapy: Secondary | ICD-10-CM | POA: Diagnosis not present

## 2020-11-04 LAB — GLUCOSE, CAPILLARY: Glucose-Capillary: 113 mg/dL — ABNORMAL HIGH (ref 70–99)

## 2020-11-04 MED ORDER — LIDOCAINE VISCOUS HCL 2 % MT SOLN: 15.0000 mL | Freq: Four times a day (QID) | OROMUCOSAL | 0 refills | Status: DC | PRN

## 2020-11-04 MED ORDER — MAGIC MOUTHWASH
5.0000 mL | Freq: Four times a day (QID) | ORAL | 1 refills | Status: DC | PRN
Start: 2020-11-04 — End: 2021-09-23

## 2020-11-04 MED ORDER — FLUDEOXYGLUCOSE F - 18 (FDG) INJECTION
9.2000 | Freq: Once | INTRAVENOUS | Status: AC
  Administered 2020-11-04: 9.57 via INTRAVENOUS

## 2020-11-04 NOTE — Progress Notes (Deleted)
[place] OFFICE PROGRESS NOTE  [name], MD [place] [id number]  DIAGNOSIS:  limited stage (T2 a, N 2, M0) *** small cell lung cancer presented with left lower lobe lung mass in addition to left infrahilar and subcarinal lymphadenopathy diagnosed in October 2022.  PRIOR THERAPY: None   CURRENT THERAPY: Cisplatin 80 mg per metered squared on days 1 and etoposide 100 mg per metered squared on days 1, 2, and 3 IV every 3 weeks.  With concurrent radiation.  First dose on 10/31/20.   INTERVAL HISTORY: Patricia [name] 70 y.o. female returns to the clinic today for follow-up visit.  The patient is recently diagnosed with limited stage lung cancer.  She underwent her first cycle of treatment last week and she tolerated it ***.  She noticed a sore throat following treatment without any signs and symptoms of infection including fever, nasal congestion, cough, chills, or malaise.  They thought they saw white dots in her throat.  The patient was evaluated in the symptomatically clinic on 11/04/2020 for possible thrush.  She was given a prescription for viscous lidocaine and nystatin.  She also was encouraged to use baking soda rinses to avoid mucositis.  She does have a prescription for Magic mouthwash.  The patient's symptoms have ***at this time.  Otherwise, she tolerated the first cycle of treatment well without any concerning adverse side effects except for***.  She does feel like her hearing has been diminished in both ears last few days.  Denies any ear pain.  She denies ever being evaluated by ENT previously.  She denies any unexplained weight loss or night sweats.  She reports a baseline cough coughing up greenish/whitish sputum.  Denies any chest pain, shortness of breath, or hemoptysis.  Denies any nausea, vomiting, diarrhea, or constipation.  She denies any headache or visual changes.  The patient recently had a staging brain MRI and PET scan  performed.  She is here today for evaluation to review her results and for 1 week follow-up visit to manage any adverse side effects of treatment.     MEDICAL HISTORY: Past Medical History:  Diagnosis Date   Anxiety    GERD (gastroesophageal reflux disease)    Hyperlipidemia    Hypertension    IBS (irritable bowel syndrome)    Insomnia    Low back pain    TIA (transient ischemic attack) 10/2017   no deficits   Vitamin B12 deficiency    Vitamin D deficiency     ALLERGIES:  is allergic to augmentin [amoxicillin-pot clavulanate], levaquin [levofloxacin in d5w], mom [magnesium hydroxide], and oxycodone.  MEDICATIONS:  Current Outpatient Medications  Medication Sig Dispense Refill   acetaminophen (TYLENOL) 325 MG tablet 1 tablet as needed.     budesonide-formoterol (SYMBICORT) 80-4.5 MCG/ACT inhaler Take 2 puffs first thing in am and then another 2 puffs about 12 hours later. 1 each 12   carboxymethylcellulose (REFRESH PLUS) 0.5 % SOLN Place 1 drop into both eyes 3 (three) times daily as needed (for dryness).      carisoprodol (SOMA) 350 MG tablet Take 350 mg by mouth 3 (three) times daily.   5   Cholecalciferol (VITAMIN D3) 50 MCG (2000 UT) TABS Take 2,000 Units by mouth daily.     clobetasol ointment (TEMOVATE) 8.31 % Apply 1 application topically See admin instructions. Apply to vaginal area daily as directed     clonazePAM (KLONOPIN) 1 MG tablet Take 1 mg by mouth  at bedtime.   3   ELIQUIS 5 MG TABS tablet TAKE 1 TABLET(5 MG) BY MOUTH TWICE DAILY 60 tablet 6   fluticasone (FLONASE) 50 MCG/ACT nasal spray Place 2 sprays into both nostrils daily as needed for allergies or rhinitis.   5   furosemide (LASIX) 20 MG tablet Take 1 tablet (20 mg total) by mouth daily. 30 tablet 2   gabapentin (NEURONTIN) 600 MG tablet Take 600 mg by mouth See admin instructions. Take 600 mg by mouth in the morning and at lunchtime     hyoscyamine (LEVSIN) 0.125 MG tablet Take 0.125 mg by mouth as needed.      lidocaine (XYLOCAINE) 2 % solution Use as directed 15 mLs in the mouth or throat every 6 (six) hours as needed for mouth pain. 100 mL 0   linaclotide (LINZESS) 145 MCG CAPS capsule Take 1 capsule (145 mcg total) by mouth daily at 4 PM. 30 capsule 0   magic mouthwash SOLN Take 5 mLs by mouth 4 (four) times daily as needed for mouth pain. Pt is allergic to Magnesium 240 mL 1   metoprolol tartrate (LOPRESSOR) 50 MG tablet Take 1 tablet (50 mg total) by mouth 2 (two) times daily. 60 tablet 0   omeprazole (PRILOSEC) 20 MG capsule Take 20 mg by mouth daily before breakfast.      ondansetron (ZOFRAN) 4 MG tablet Take 1 tablet (4 mg total) by mouth every 8 (eight) hours as needed for nausea or vomiting. 30 tablet 1   ondansetron (ZOFRAN-ODT) 8 MG disintegrating tablet Take 8 mg by mouth 3 (three) times daily.     polyethylene glycol (MIRALAX / GLYCOLAX) packet Take 34 g by mouth in the morning.      prochlorperazine (COMPAZINE) 10 MG tablet Take 1 tablet (10 mg total) by mouth every 6 (six) hours as needed for nausea or vomiting. 30 tablet 0   rosuvastatin (CRESTOR) 5 MG tablet Take 5 mg by mouth 3 (three) times a week. Mondays Wednesdays Fridays     sodium chloride 1 g tablet Take 2 tablets (2 g total) by mouth 3 (three) times daily with meals. (Patient taking differently: Take 1 g by mouth 3 (three) times daily with meals.) 180 tablet 0   traMADol (ULTRAM) 50 MG tablet Take 100 mg by mouth as needed.     VENTOLIN HFA 108 (90 Base) MCG/ACT inhaler Inhale 2 puffs into the lungs every 6 (six) hours as needed for wheezing or shortness of breath.   5   zaleplon (SONATA) 10 MG capsule Take 10 mg by mouth at bedtime as needed (for interrupted sleep).      No current facility-administered medications for this visit.    SURGICAL HISTORY:  Past Surgical History:  Procedure Laterality Date   BACK SURGERY     BREAST EXCISIONAL BIOPSY Left 1997   benign   BRONCHIAL BIOPSY  10/18/2020   Procedure: BRONCHIAL  BIOPSIES;  Surgeon: [name], DO;  Location: [facility];  Service: Pulmonary;;   BRONCHIAL BRUSHINGS  10/18/2020   Procedure: BRONCHIAL BRUSHINGS;  Surgeon: [name], DO;  Location: [facility];  Service: Pulmonary;;   BRONCHIAL NEEDLE ASPIRATION BIOPSY  10/18/2020   Procedure: BRONCHIAL NEEDLE ASPIRATION BIOPSIES;  Surgeon: [name], DO;  Location: [facility];  Service: Pulmonary;;   BRONCHIAL WASHINGS  10/18/2020   Procedure: BRONCHIAL WASHINGS;  Surgeon: [name], DO;  Location: [facility];  Service: Pulmonary;;   hemorrhoidecotmy     HERNIA  MESH REMOVAL     OPEN REDUCTION INTERNAL FIXATION (ORIF) DISTAL RADIAL FRACTURE Right 11/19/2019   Procedure: OPEN REDUCTION INTERNAL FIXATION (ORIF) DISTAL RADIUS AND ULNA FRACTURE WITH REPAIR AS NECESSARY;  Surgeon: Roseanne Kaufman, MD;  Location: [facility];  Service: Orthopedics;  Laterality: Right;  2 hrs Block with IV sedation   ORIF RADIUS & ULNA FRACTURES     TONSILLECTOMY     VIDEO BRONCHOSCOPY WITH ENDOBRONCHIAL NAVIGATION Left 10/18/2020   Procedure: VIDEO BRONCHOSCOPY WITH ENDOBRONCHIAL NAVIGATION;  Surgeon: [name], DO;  Location: [facility];  Service: Pulmonary;  Laterality: Left;  ION   VIDEO BRONCHOSCOPY WITH ENDOBRONCHIAL ULTRASOUND Bilateral 10/18/2020   Procedure: VIDEO BRONCHOSCOPY WITH ENDOBRONCHIAL ULTRASOUND;  Surgeon: [name], DO;  Location: [facility];  Service: Pulmonary;  Laterality: Bilateral;   VIDEO BRONCHOSCOPY WITH RADIAL ENDOBRONCHIAL ULTRASOUND  10/18/2020   Procedure: VIDEO BRONCHOSCOPY WITH RADIAL ENDOBRONCHIAL ULTRASOUND;  Surgeon: [name], DO;  Location: [facility];  Service: Pulmonary;;    REVIEW OF SYSTEMS:   Review of Systems  Constitutional: Negative for appetite change, chills, fatigue, fever and unexpected weight change.  HENT:   Negative for mouth sores, nosebleeds, sore throat and trouble swallowing.   Eyes: Negative for eye problems and  icterus.  Respiratory: Negative for cough, hemoptysis, shortness of breath and wheezing.   Cardiovascular: Negative for chest pain and leg swelling.  Gastrointestinal: Negative for abdominal pain, constipation, diarrhea, nausea and vomiting.  Genitourinary: Negative for bladder incontinence, difficulty urinating, dysuria, frequency and hematuria.   Musculoskeletal: Negative for back pain, gait problem, neck pain and neck stiffness.  Skin: Negative for itching and rash.  Neurological: Negative for dizziness, extremity weakness, gait problem, headaches, light-headedness and seizures.  Hematological: Negative for adenopathy. Does not bruise/bleed easily.  Psychiatric/Behavioral: Negative for confusion, depression and sleep disturbance. The patient is not nervous/anxious.     PHYSICAL EXAMINATION:  There were no vitals taken for this visit.  ECOG PERFORMANCE STATUS: {CHL ONC ECOG [id number]  Physical Exam  Constitutional: Oriented to person, place, and time and well-developed, well-nourished, and in no distress. No distress.  HENT:  Head: Normocephalic and atraumatic.  Mouth/Throat: Oropharynx is clear and moist. No oropharyngeal exudate.  Eyes: Conjunctivae are normal. Right eye exhibits no discharge. Left eye exhibits no discharge. No scleral icterus.  Neck: Normal range of motion. Neck supple.  Cardiovascular: Normal rate, regular rhythm, normal heart sounds and intact distal pulses.   Pulmonary/Chest: Effort normal and breath sounds normal. No respiratory distress. No wheezes. No rales.  Abdominal: Soft. Bowel sounds are normal. Exhibits no distension and no mass. There is no tenderness.  Musculoskeletal: Normal range of motion. Exhibits no edema.  Lymphadenopathy:    No cervical adenopathy.  Neurological: Alert and oriented to person, place, and time. Exhibits normal muscle tone. Gait normal. Coordination normal.  Skin: Skin is warm and dry. No rash noted. Not diaphoretic. No  erythema. No pallor.  Psychiatric: Mood, memory and judgment normal.  Vitals reviewed.  LABORATORY DATA: Lab Results  Component Value Date   WBC 6.2 10/31/2020   HGB 13.2 10/31/2020   HCT 38.5 10/31/2020   MCV 101.0 (H) 10/31/2020   PLT 224 10/31/2020      Chemistry      Component Value Date/Time   NA 140 10/31/2020 0740   K 4.1 10/31/2020 0740   CL 104 10/31/2020 0740   CO2 29 10/31/2020 0740   BUN 12 10/31/2020 0740   CREATININE 0.75 10/31/2020 0740  Component Value Date/Time   CALCIUM 9.4 10/31/2020 0740   ALKPHOS 69 10/31/2020 0740   AST 32 10/31/2020 0740   ALT 28 10/31/2020 0740   BILITOT 0.3 10/31/2020 0740       RADIOGRAPHIC STUDIES:  MR BRAIN W WO CONTRAST  Result Date: 10/16/2020 CLINICAL DATA:  Non-small cell lung cancer, staging. EXAM: MRI HEAD WITHOUT AND WITH CONTRAST TECHNIQUE: Multiplanar, multiecho pulse sequences of the brain and surrounding structures were obtained without and with intravenous contrast. CONTRAST:  7.15mL GADAVIST GADOBUTROL 1 MMOL/ML IV SOLN COMPARISON:  CT 09/30/2020.  Brain MRI 10/16/2017. FINDINGS: Brain: Mild generalized cerebral and cerebellar atrophy. Mild multifocal T2 FLAIR hyperintense signal abnormality within the cerebral white matter, nonspecific but compatible with chronic small vessel ischemic disease. There is no acute infarct. No evidence of an intracranial mass. No chronic intracranial blood products. No extra-axial fluid collection. No midline shift. No pathologic intracranial enhancement is demonstrated to suggest intracranial metastatic disease. Vascular: Maintained flow voids within the proximal large arterial vessels. Skull and upper cervical spine: No focal suspicious marrow lesion. Sinuses/Orbits: Visualized orbits show no acute finding. Mild mucosal thickening versus small volume fluid within the anterior right ethmoid air cells. Other: Trace fluid within the right greater than left mastoid air cells. IMPRESSION: No  evidence of intracranial metastatic disease. Mild chronic small vessel ischemic changes within the cerebral white matter. Mild generalized parenchymal atrophy. Mild right ethmoid sinusitis. Trace fluid within the bilateral mastoid air cells. Electronically Signed   By: Kellie Simmering D.O.   On: 10/16/2020 16:17   CT CHEST ABDOMEN PELVIS W CONTRAST  Result Date: 10/15/2020 CLINICAL DATA:  Inpatient. Severe hyponatremia in a previous smoker. 20 pound weight gain in 1 year. EXAM: CT CHEST, ABDOMEN, AND PELVIS WITH CONTRAST TECHNIQUE: Multidetector CT imaging of the chest, abdomen and pelvis was performed following the standard protocol during bolus administration of intravenous contrast. CONTRAST:  40mL OMNIPAQUE IOHEXOL 350 MG/ML SOLN COMPARISON:  11/07/2008 CT chest, abdomen and pelvis. FINDINGS: CT CHEST FINDINGS Cardiovascular: Normal heart size. No significant pericardial effusion/thickening. Three-vessel coronary atherosclerosis. Atherosclerotic nonaneurysmal thoracic aorta. Normal caliber pulmonary arteries. No central pulmonary emboli. Mediastinum/Nodes: No discrete thyroid nodules. Unremarkable esophagus. No axillary adenopathy. Enlarged 2.1 cm subcarinal node (series 2/image 29). No additional pathologically enlarged mediastinal nodes. No right hilar adenopathy. Enlarged 1.4 cm left infrahilar node (series 2/image 37). Lungs/Pleura: No pneumothorax. No pleural effusion. Moderate to severe centrilobular emphysema with mild diffuse bronchial wall thickening. Irregular solid 3.8 x 3.0 cm central left lower lobe lung mass (series 4/image 105), new. No additional significant pulmonary nodules. Musculoskeletal: No aggressive appearing focal osseous lesions. Mild thoracic spondylosis. CT ABDOMEN PELVIS FINDINGS Hepatobiliary: Liver surface appears finely irregular, cannot exclude cirrhosis. No liver masses. Normal gallbladder with no radiopaque cholelithiasis. No biliary ductal dilatation. Pancreas: Normal, with  no mass or duct dilation. Spleen: Normal size. No mass. Adrenals/Urinary Tract: Normal adrenals. Subcentimeter hypodense lower right and interpolar left renal cortical lesions are too small to characterize and require no follow-up. No suspicious renal masses. No hydronephrosis. Normal bladder. Stomach/Bowel: Normal non-distended stomach. Normal caliber small bowel with no small bowel wall thickening. Normal appendix. Normal large bowel with no diverticulosis, large bowel wall thickening or pericolonic fat stranding. Vascular/Lymphatic: Atherosclerotic nonaneurysmal abdominal aorta. Patent portal, splenic, hepatic and renal veins. No pathologically enlarged lymph nodes in the abdomen or pelvis. Reproductive: Grossly normal uterus.  No adnexal mass. Other: No pneumoperitoneum, ascites or focal fluid collection. Musculoskeletal: No aggressive appearing focal osseous lesions. Chronic  moderate L2 vertebral compression fracture status post vertebroplasty. Mild lumbar spondylosis. IMPRESSION: 1. Irregular solid 3.8 cm central left lower lobe lung mass, new since 2010 comparison chest CT, highly suspicious for primary bronchogenic carcinoma. 2. Left infrahilar and subcarinal lymphadenopathy, suspicious for nodal metastases. 3. Multidisciplinary thoracic oncology consultation and PET-CT suggested. 4. Liver surface appears finely irregular, cannot exclude cirrhosis. No liver masses. Suggest correlation with liver function tests. Consider outpatient hepatic elastography for further liver fibrosis risk stratification, as clinically warranted. 5. Three-vessel coronary atherosclerosis. 6. Aortic Atherosclerosis (ICD10-I70.0). Electronically Signed   By: Ilona Sorrel M.D.   On: 10/15/2020 15:30   DG CHEST PORT 1 VIEW  Result Date: 10/18/2020 CLINICAL DATA:  Status post bronchoscopy. EXAM: PORTABLE CHEST 1 VIEW COMPARISON:  September 30, 2020. FINDINGS: The heart size and mediastinal contours are within normal limits. Both  lungs are clear. No pneumothorax or pleural effusion is noted. The visualized skeletal structures are unremarkable. IMPRESSION: No active disease. Aortic Atherosclerosis (ICD10-I70.0). Electronically Signed   By: Marijo Conception M.D.   On: 10/18/2020 12:41   DG C-ARM BRONCHOSCOPY  Result Date: 10/18/2020 C-ARM BRONCHOSCOPY: Fluoroscopy was utilized by the requesting physician.  No radiographic interpretation.     ASSESSMENT/PLAN:  This is a very pleasant 70 years old white female recently diagnosed with limited stage (T2 a, N 2, M0)*** small cell lung cancer presented with left lower lobe lung mass in addition to left infrahilar and subcarinal lymphadenopathy diagnosed in October 2022.  She is currently undergoing concurrent chemoradiation with cisplatin 80 mg per metered squared on days 1 and etoposide 100 mg per metered squared on days 1, 2, and 3 IV every 3 weeks.  She is status post her first cycle.  She tolerated it well except for ***.   The patient was also seen with Dr. Julien Nordmann who personally independently reviewed her PET scan and discussed results with the patient today.  The scan showed ***Mohamed recommend that she continue with the same treatment at the same dose.  We will see her back for follow-up visit in 2 weeks for evaluation before starting cycle #2.  Thrush?  Hearing and referral to audiology?  The patient was advised to call immediately if she has any concerning symptoms in the interval. The patient voices understanding of current disease status and treatment options and is in agreement with the current care plan. All questions were answered. The patient knows to call the clinic with any problems, questions or concerns. We can certainly see the patient much sooner if necessary        No orders of the defined types were placed in this encounter.    I spent {CHL ONC TIME VISIT - ZSWFU:[id number]} counseling the patient face to face. The total time spent in the  appointment was {CHL ONC TIME VISIT - GURKY:[id number]}.  Anayi Bricco L Kilie Rund, PA-C 11/04/20

## 2020-11-04 NOTE — Progress Notes (Signed)
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 today with chief complaint of sore throat x2 days.  She states when she woke up today of symptom onset her spouse noticed white patches on the back of her throat.  She has a leftover prescription for fluconazole for the last time she had oral candidiasis in September 2022 so she tried taking 2 of those pills with minimal symptom improvement.  She describes the pain as soreness and aching.  She is able to still tolerate liquids and feels that she is staying well-hydrated.  Patient also admits that she has hearing loss in both ears.  She states that she has been aging she noticed she does not hear as well however over the last 3 days noticed that hearing loss seemed worse.  Denies any associated pain.  No sick contacts or known COVID exposures.    Review of Systems  Constitutional:  Negative for chills and fever.  HENT:  Positive for hearing loss and  sore throat. Negative for congestion, ear discharge, ear pain, sinus pain and tinnitus.   Eyes:  Negative for pain.  Respiratory:  Negative for cough, hemoptysis, sputum production, shortness of breath and wheezing.   Cardiovascular:  Negative for chest pain and leg swelling.  Gastrointestinal:  Negative for abdominal pain, constipation, diarrhea, nausea and vomiting.  Genitourinary:  Negative for dysuria.  Musculoskeletal:  Negative for joint pain, myalgias and neck pain.  Skin:  Negative for itching and rash.  Neurological:  Negative for dizziness and weakness.  Endo/Heme/Allergies:  Does not bruise/bleed easily.  Psychiatric/Behavioral:  The patient is not nervous/anxious.       Allergies  Allergen Reactions   Augmentin [Amoxicillin-Pot Clavulanate] Nausea Only and Other (See Comments)    GI upset   Levaquin [Levofloxacin In D5w] Other (See Comments)    Joint problems    Mom [Magnesium Hydroxide] Other (See Comments)    Welts    Oxycodone Other (See Comments)    Nausea      Past Medical History:  Diagnosis Date   Anxiety    GERD (gastroesophageal reflux disease)    Hyperlipidemia    Hypertension    IBS (irritable bowel syndrome)    Insomnia    Low back pain    TIA (transient ischemic attack) 10/2017   no deficits   Vitamin B12 deficiency    Vitamin D deficiency      Past Surgical History:  Procedure Laterality Date   BACK SURGERY     BREAST EXCISIONAL BIOPSY Left 1997   benign   BRONCHIAL BIOPSY  10/18/2020   Procedure: BRONCHIAL BIOPSIES;  Surgeon: [name], DO;  Location: [place] [facility];  Service: Pulmonary;;   BRONCHIAL BRUSHINGS  10/18/2020   Procedure: BRONCHIAL BRUSHINGS;  Surgeon: [name], DO;  Location: [place];  Service: Pulmonary;;   BRONCHIAL NEEDLE ASPIRATION BIOPSY  10/18/2020   Procedure: BRONCHIAL NEEDLE ASPIRATION BIOPSIES;  Surgeon: [name], DO;  Location: [place];  Service: Pulmonary;;   BRONCHIAL WASHINGS   10/18/2020   Procedure: BRONCHIAL WASHINGS;  Surgeon: [name], DO;  Location: [place] [facility];  Service: Pulmonary;;   hemorrhoidecotmy     HERNIA MESH REMOVAL     OPEN REDUCTION INTERNAL FIXATION (ORIF) DISTAL RADIAL FRACTURE Right 11/19/2019   Procedure: OPEN REDUCTION INTERNAL FIXATION (ORIF) DISTAL RADIUS AND ULNA FRACTURE WITH REPAIR AS NECESSARY;  Surgeon: Roseanne Kaufman, MD;  Location: [place];  Service: Orthopedics;  Laterality: Right;  2 hrs Block with IV sedation   ORIF RADIUS & ULNA FRACTURES     TONSILLECTOMY     VIDEO BRONCHOSCOPY WITH ENDOBRONCHIAL NAVIGATION Left 10/18/2020   Procedure: VIDEO BRONCHOSCOPY WITH ENDOBRONCHIAL NAVIGATION;  Surgeon: [name], DO;  Location: [place];  Service: Pulmonary;  Laterality: Left;  ION   VIDEO BRONCHOSCOPY WITH ENDOBRONCHIAL ULTRASOUND Bilateral 10/18/2020   Procedure: VIDEO BRONCHOSCOPY WITH ENDOBRONCHIAL ULTRASOUND;  Surgeon: [name], DO;  Location: [place];  Service: Pulmonary;  Laterality: Bilateral;   VIDEO BRONCHOSCOPY WITH RADIAL ENDOBRONCHIAL ULTRASOUND  10/18/2020   Procedure: VIDEO BRONCHOSCOPY WITH RADIAL ENDOBRONCHIAL ULTRASOUND;  Surgeon: [name], DO;  Location: [facility];  Service: Pulmonary;;    Social History   Socioeconomic History   Marital status: Married    Spouse name: Not on file   Number of children: Not on file   Years of education: Not on file   Highest education level: Not on file  Occupational History   Not on file  Tobacco Use   Smoking status: Former    Packs/day: 1.00    Years: 50.00    Pack years: 50.00    Types: Cigarettes    Start date: 41    Quit date: 12/14/2019    Years since quitting: 0.8   Smokeless tobacco: Never  Vaping Use   Vaping Use: Never used  Substance and Sexual Activity   Alcohol use: Yes    Comment: occ once every 2 months   Drug use: Never   Sexual activity: Not on file  Other Topics Concern   Not on file  Social History  Narrative   Live with husband.  Education HS.  Retired.  Children 2 (daughter). 1 cup coffee.     Social Determinants of Health   Financial Resource Strain: Not on file  Food Insecurity: Not on file  Transportation Needs: Not on file  Physical Activity: Not on file  Stress: Not on file  Social Connections: Not on file  Intimate Partner Violence: Not on file    Family History  Problem Relation Age of Onset   Breast cancer Sister    Breast cancer Maternal Aunt    Breast cancer Paternal Aunt      Current Outpatient Medications:    lidocaine (XYLOCAINE) 2 % solution, Use as directed 15 mLs in the mouth or throat every 6 (six) hours as needed for mouth pain., Disp: 100 mL, Rfl: 0   acetaminophen (  TYLENOL) 325 MG tablet, 1 tablet as needed., Disp: , Rfl:    budesonide-formoterol (SYMBICORT) 80-4.5 MCG/ACT inhaler, Take 2 puffs first thing in am and then another 2 puffs about 12 hours later., Disp: 1 each, Rfl: 12   carboxymethylcellulose (REFRESH PLUS) 0.5 % SOLN, Place 1 drop into both eyes 3 (three) times daily as needed (for dryness). , Disp: , Rfl:    carisoprodol (SOMA) 350 MG tablet, Take 350 mg by mouth 3 (three) times daily. , Disp: , Rfl: 5   Cholecalciferol (VITAMIN D3) 50 MCG (2000 UT) TABS, Take 2,000 Units by mouth daily., Disp: , Rfl:    clobetasol ointment (TEMOVATE) 0.17 %, Apply 1 application topically See admin instructions. Apply to vaginal area daily as directed, Disp: , Rfl:    clonazePAM (KLONOPIN) 1 MG tablet, Take 1 mg by mouth at bedtime. , Disp: , Rfl: 3   ELIQUIS 5 MG TABS tablet, TAKE 1 TABLET(5 MG) BY MOUTH TWICE DAILY, Disp: 60 tablet, Rfl: 6   fluticasone (FLONASE) 50 MCG/ACT nasal spray, Place 2 sprays into both nostrils daily as needed for allergies or rhinitis. , Disp: , Rfl: 5   furosemide (LASIX) 20 MG tablet, Take 1 tablet (20 mg total) by mouth daily., Disp: 30 tablet, Rfl: 2   gabapentin (NEURONTIN) 600 MG tablet, Take 600 mg by mouth See admin  instructions. Take 600 mg by mouth in the morning and at lunchtime, Disp: , Rfl:    hyoscyamine (LEVSIN) 0.125 MG tablet, Take 0.125 mg by mouth as needed., Disp: , Rfl:    linaclotide (LINZESS) 145 MCG CAPS capsule, Take 1 capsule (145 mcg total) by mouth daily at 4 PM., Disp: 30 capsule, Rfl: 0   magic mouthwash SOLN, Take 5 mLs by mouth 4 (four) times daily as needed for mouth pain. Pt is allergic to Magnesium, Disp: 240 mL, Rfl: 1   metoprolol tartrate (LOPRESSOR) 50 MG tablet, Take 1 tablet (50 mg total) by mouth 2 (two) times daily., Disp: 60 tablet, Rfl: 0   omeprazole (PRILOSEC) 20 MG capsule, Take 20 mg by mouth daily before breakfast. , Disp: , Rfl:    ondansetron (ZOFRAN) 4 MG tablet, Take 1 tablet (4 mg total) by mouth every 8 (eight) hours as needed for nausea or vomiting., Disp: 30 tablet, Rfl: 1   ondansetron (ZOFRAN-ODT) 8 MG disintegrating tablet, Take 8 mg by mouth 3 (three) times daily., Disp: , Rfl:    polyethylene glycol (MIRALAX / GLYCOLAX) packet, Take 34 g by mouth in the morning. , Disp: , Rfl:    prochlorperazine (COMPAZINE) 10 MG tablet, Take 1 tablet (10 mg total) by mouth every 6 (six) hours as needed for nausea or vomiting., Disp: 30 tablet, Rfl: 0   rosuvastatin (CRESTOR) 5 MG tablet, Take 5 mg by mouth 3 (three) times a week. Mondays Wednesdays Fridays, Disp: , Rfl:    sodium chloride 1 g tablet, Take 2 tablets (2 g total) by mouth 3 (three) times daily with meals. (Patient taking differently: Take 1 g by mouth 3 (three) times daily with meals.), Disp: 180 tablet, Rfl: 0   traMADol (ULTRAM) 50 MG tablet, Take 100 mg by mouth as needed., Disp: , Rfl:    VENTOLIN HFA 108 (90 Base) MCG/ACT inhaler, Inhale 2 puffs into the lungs every 6 (six) hours as needed for wheezing or shortness of breath. , Disp: , Rfl: 5   zaleplon (SONATA) 10 MG capsule, Take 10 mg by mouth at bedtime as needed (for  interrupted sleep). , Disp: , Rfl:   PHYSICAL EXAM: ECOG FS:1 - Symptomatic but  completely ambulatory    Vitals:   11/04/20 1052  BP: (!) 159/88  Pulse: 70  Resp: 17  Temp: 98.5 F (36.9 C)  TempSrc: Oral  SpO2: 98%  Weight: 188 lb (85.3 kg)  Height: 5\' 6"  (1.676 m)   Physical Exam Vitals and nursing note reviewed.  Constitutional:      General: She is not in acute distress.    Appearance: She is not ill-appearing.  HENT:     Head: Normocephalic and atraumatic.     Comments: No sinus or temporal tenderness.     Right Ear: Tympanic membrane and external ear normal. No tenderness. No middle ear effusion. There is no impacted cerumen. No mastoid tenderness. No hemotympanum. Tympanic membrane is not injected, perforated, erythematous, retracted or bulging.     Left Ear: Tympanic membrane and external ear normal. No tenderness.  No middle ear effusion. There is no impacted cerumen. No mastoid tenderness. No hemotympanum. Tympanic membrane is not injected, perforated, erythematous, retracted or bulging.     Nose: Nose normal.     Mouth/Throat:     Mouth: Mucous membranes are moist.     Pharynx: Oropharynx is clear. Uvula midline. No uvula swelling.     Comments: Minor erythema to oropharynx, no edema, no exudate, no tonsillar swelling, voice normal, neck supple without lymphadenopathy. White patches seen on posterior soft palate. No open sores, no bleeding   Eyes:     General: No scleral icterus.       Right eye: No discharge.        Left eye: No discharge.     Extraocular Movements: Extraocular movements intact.     Conjunctiva/sclera: Conjunctivae normal.     Pupils: Pupils are equal, round, and reactive to light.  Neck:     Vascular: No JVD.  Cardiovascular:     Rate and Rhythm: Normal rate and regular rhythm.     Pulses: Normal pulses.          Radial pulses are 2+ on the right side and 2+ on the left side.     Heart sounds: Normal heart sounds.  Pulmonary:     Comments: Lungs clear to auscultation in all fields. Symmetric chest rise. No wheezing,  rales, or rhonchi. Abdominal:     Comments: Abdomen is soft, non-distended, and non-tender in all quadrants. No rigidity, no guarding. No peritoneal signs.  Musculoskeletal:        General: Normal range of motion.     Cervical back: Normal range of motion.  Skin:    General: Skin is warm and dry.     Capillary Refill: Capillary refill takes less than 2 seconds.  Neurological:     Mental Status: She is oriented to person, place, and time.     GCS: GCS eye subscore is 4. GCS verbal subscore is 5. GCS motor subscore is 6.     Comments: Fluent speech, no facial droop.  Psychiatric:        Behavior: Behavior normal.       LABORATORY DATA: I have reviewed the data as listed CBC Latest Ref Rng & Units 10/31/2020 10/21/2020 10/19/2020  WBC 4.0 - 10.5 K/uL 6.2 4.5 7.1  Hemoglobin 12.0 - 15.0 g/dL 13.2 12.8 13.5  Hematocrit 36.0 - 46.0 % 38.5 37.9 39.0  Platelets 150 - 400 K/uL 224 258 268     CMP Latest Ref Rng & Units  10/31/2020 10/21/2020 10/19/2020  Glucose 70 - 99 mg/dL 87 79 109(H)  BUN 8 - 23 mg/dL 12 13 13   Creatinine 0.44 - 1.00 mg/dL 0.75 0.72 0.60  Sodium 135 - 145 mmol/L 140 139 132(L)  Potassium 3.5 - 5.1 mmol/L 4.1 3.8 4.5  Chloride 98 - 111 mmol/L 104 102 98  CO2 22 - 32 mmol/L 29 30 26   Calcium 8.9 - 10.3 mg/dL 9.4 8.5(L) 9.1  Total Protein 6.5 - 8.1 g/dL 6.4(L) 6.3(L) -  Total Bilirubin 0.3 - 1.2 mg/dL 0.3 0.3 -  Alkaline Phos 38 - 126 U/L 69 68 -  AST 15 - 41 U/L 32 25 -  ALT 0 - 44 U/L 28 26 -       RADIOGRAPHIC STUDIES: I have personally reviewed the radiological images as listed and agreed with the findings in the report. No images are attached to the encounter. MR BRAIN W WO CONTRAST  Result Date: 10/16/2020 CLINICAL DATA:  Non-small cell lung cancer, staging. EXAM: MRI HEAD WITHOUT AND WITH CONTRAST TECHNIQUE: Multiplanar, multiecho pulse sequences of the brain and surrounding structures were obtained without and with intravenous contrast. CONTRAST:   7.38mL GADAVIST GADOBUTROL 1 MMOL/ML IV SOLN COMPARISON:  CT 09/30/2020.  Brain MRI 10/16/2017. FINDINGS: Brain: Mild generalized cerebral and cerebellar atrophy. Mild multifocal T2 FLAIR hyperintense signal abnormality within the cerebral white matter, nonspecific but compatible with chronic small vessel ischemic disease. There is no acute infarct. No evidence of an intracranial mass. No chronic intracranial blood products. No extra-axial fluid collection. No midline shift. No pathologic intracranial enhancement is demonstrated to suggest intracranial metastatic disease. Vascular: Maintained flow voids within the proximal large arterial vessels. Skull and upper cervical spine: No focal suspicious marrow lesion. Sinuses/Orbits: Visualized orbits show no acute finding. Mild mucosal thickening versus small volume fluid within the anterior right ethmoid air cells. Other: Trace fluid within the right greater than left mastoid air cells. IMPRESSION: No evidence of intracranial metastatic disease. Mild chronic small vessel ischemic changes within the cerebral white matter. Mild generalized parenchymal atrophy. Mild right ethmoid sinusitis. Trace fluid within the bilateral mastoid air cells. Electronically Signed   By: Kellie Simmering D.O.   On: 10/16/2020 16:17   CT CHEST ABDOMEN PELVIS W CONTRAST  Result Date: 10/15/2020 CLINICAL DATA:  Inpatient. Severe hyponatremia in a previous smoker. 20 pound weight gain in 1 year. EXAM: CT CHEST, ABDOMEN, AND PELVIS WITH CONTRAST TECHNIQUE: Multidetector CT imaging of the chest, abdomen and pelvis was performed following the standard protocol during bolus administration of intravenous contrast. CONTRAST:  32mL OMNIPAQUE IOHEXOL 350 MG/ML SOLN COMPARISON:  11/07/2008 CT chest, abdomen and pelvis. FINDINGS: CT CHEST FINDINGS Cardiovascular: Normal heart size. No significant pericardial effusion/thickening. Three-vessel coronary atherosclerosis. Atherosclerotic nonaneurysmal thoracic  aorta. Normal caliber pulmonary arteries. No central pulmonary emboli. Mediastinum/Nodes: No discrete thyroid nodules. Unremarkable esophagus. No axillary adenopathy. Enlarged 2.1 cm subcarinal node (series 2/image 29). No additional pathologically enlarged mediastinal nodes. No right hilar adenopathy. Enlarged 1.4 cm left infrahilar node (series 2/image 37). Lungs/Pleura: No pneumothorax. No pleural effusion. Moderate to severe centrilobular emphysema with mild diffuse bronchial wall thickening. Irregular solid 3.8 x 3.0 cm central left lower lobe lung mass (series 4/image 105), new. No additional significant pulmonary nodules. Musculoskeletal: No aggressive appearing focal osseous lesions. Mild thoracic spondylosis. CT ABDOMEN PELVIS FINDINGS Hepatobiliary: Liver surface appears finely irregular, cannot exclude cirrhosis. No liver masses. Normal gallbladder with no radiopaque cholelithiasis. No biliary ductal dilatation. Pancreas: Normal, with no mass or duct  dilation. Spleen: Normal size. No mass. Adrenals/Urinary Tract: Normal adrenals. Subcentimeter hypodense lower right and interpolar left renal cortical lesions are too small to characterize and require no follow-up. No suspicious renal masses. No hydronephrosis. Normal bladder. Stomach/Bowel: Normal non-distended stomach. Normal caliber small bowel with no small bowel wall thickening. Normal appendix. Normal large bowel with no diverticulosis, large bowel wall thickening or pericolonic fat stranding. Vascular/Lymphatic: Atherosclerotic nonaneurysmal abdominal aorta. Patent portal, splenic, hepatic and renal veins. No pathologically enlarged lymph nodes in the abdomen or pelvis. Reproductive: Grossly normal uterus.  No adnexal mass. Other: No pneumoperitoneum, ascites or focal fluid collection. Musculoskeletal: No aggressive appearing focal osseous lesions. Chronic moderate L2 vertebral compression fracture status post vertebroplasty. Mild lumbar spondylosis.  IMPRESSION: 1. Irregular solid 3.8 cm central left lower lobe lung mass, new since 2010 comparison chest CT, highly suspicious for primary bronchogenic carcinoma. 2. Left infrahilar and subcarinal lymphadenopathy, suspicious for nodal metastases. 3. Multidisciplinary thoracic oncology consultation and PET-CT suggested. 4. Liver surface appears finely irregular, cannot exclude cirrhosis. No liver masses. Suggest correlation with liver function tests. Consider outpatient hepatic elastography for further liver fibrosis risk stratification, as clinically warranted. 5. Three-vessel coronary atherosclerosis. 6. Aortic Atherosclerosis (ICD10-I70.0). Electronically Signed   By: Ilona Sorrel M.D.   On: 10/15/2020 15:30   DG CHEST PORT 1 VIEW  Result Date: 10/18/2020 CLINICAL DATA:  Status post bronchoscopy. EXAM: PORTABLE CHEST 1 VIEW COMPARISON:  September 30, 2020. FINDINGS: The heart size and mediastinal contours are within normal limits. Both lungs are clear. No pneumothorax or pleural effusion is noted. The visualized skeletal structures are unremarkable. IMPRESSION: No active disease. Aortic Atherosclerosis (ICD10-I70.0). Electronically Signed   By: Marijo Conception M.D.   On: 10/18/2020 12:41   DG C-ARM BRONCHOSCOPY  Result Date: 10/18/2020 C-ARM BRONCHOSCOPY: Fluoroscopy was utilized by the requesting physician.  No radiographic interpretation.     ASSESSMENT & PLAN: Patient is a 70 y.o. female with history of Limited stage (T2 a, N 2, M0) small cell lung cancer presented with left lower lobe lung mass in addition to left infrahilar and subcarinal lymphadenopathy diagnosed in October 2022 followed by oncologist Dr. Julien Nordmann.   #) Sore throat- Patient prescribed Fluconazole 100 mg (3 tabs) x last month by Dr. Melvyn Novas which she tried taking with minimal improvement, only took 2 pills total. Exam is suggestive of oral thrush. Strep unlikely given presentation, patient's ago and lack of fever.  Will prescribe  nystatin and also viscous lidocaine to help with pain. Recommended baking soda rinses to help protect against mucositis. Encouraged patient to follow up in clinic if symptoms worsen or do not improve. Discussed signs of infection to watch for. Discussed ED precautions as well.   #) hearing loss- no signs of otitis media, external or perforation. No mastoid tenderness. Could be side effect of chemo and recommended if symptoms continue to discuss with oncologist.  #) Limited stage (T2 a, N 2, M0) small cell lung cancer presented with left lower lobe lung mass in addition to left infrahilar and subcarinal lymphadenopathy- continue treatment per primary oncologist.    Visit Diagnosis: 1. Thrush, oral   2. 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   All questions were answered. The patient knows to call the clinic with any problems, questions or concerns. No barriers to learning was detected.  I have spent a total of 20 minutes  minutes of face-to-face and non-face-to-face time, preparing to see the patient, obtaining and/or reviewing separately obtained history, performing a medically appropriate examination, counseling and educating the patient, ordering tests,  documenting clinical information in the electronic health record, and care coordination.     Thank you for allowing me to participate in the care of this patient.    Barrie Folk, PA-C Department of Hematology/Oncology [place] at [facility] Phone: [phone]   11/04/2020 3:17 PM

## 2020-11-04 NOTE — Telephone Encounter (Signed)
Called in rx for Magic Mouthwash.

## 2020-11-04 NOTE — Patient Instructions (Signed)
-  Rinse your mouth (swish, lightly gargle and spit) before and after meals and at bedtime with either: Salt water (1 tsp of table salt to 1 quart [32 oz.] of water) or; Salt and Soda rinse (1 tsp of salt and 1 tsp of baking soda in 1 quart [32 ounces] of warm water).  - Use a soft-bristle toothbrush after meals and at bedtime. Soaking in hot water can make the brush bristles softer. If the brush causes pain, toothettes may be used (but these are not as effective as a soft or super soft brush). -Use non-abrasive toothpaste (or mix 1 tsp baking soda in 2 cups water). -Avoid toothpastes with whiteners or other additives. -Keep lips moist with moisturizers (like Chap stick or Blistex).

## 2020-11-04 NOTE — Telephone Encounter (Signed)
Persistent sore throat -possible thrush .   Chemo 10/24 x 3 days. No GCSF.  I think she had an old rx for diflucan and she took it yesterday. She has not had any pain relief.  Message sent to [facility] . I told pt to come her after her PET scan.

## 2020-11-05 DIAGNOSIS — C3490 Malignant neoplasm of unspecified part of unspecified bronchus or lung: Secondary | ICD-10-CM | POA: Diagnosis not present

## 2020-11-06 DIAGNOSIS — K219 Gastro-esophageal reflux disease without esophagitis: Secondary | ICD-10-CM | POA: Diagnosis not present

## 2020-11-06 DIAGNOSIS — I480 Paroxysmal atrial fibrillation: Secondary | ICD-10-CM | POA: Diagnosis not present

## 2020-11-06 DIAGNOSIS — G4700 Insomnia, unspecified: Secondary | ICD-10-CM | POA: Diagnosis not present

## 2020-11-06 DIAGNOSIS — I10 Essential (primary) hypertension: Secondary | ICD-10-CM | POA: Diagnosis not present

## 2020-11-06 DIAGNOSIS — C3492 Malignant neoplasm of unspecified part of left bronchus or lung: Secondary | ICD-10-CM | POA: Diagnosis not present

## 2020-11-06 DIAGNOSIS — E7800 Pure hypercholesterolemia, unspecified: Secondary | ICD-10-CM | POA: Diagnosis not present

## 2020-11-06 DIAGNOSIS — G459 Transient cerebral ischemic attack, unspecified: Secondary | ICD-10-CM | POA: Diagnosis not present

## 2020-11-07 NOTE — Progress Notes (Signed)
Thoracic Location of Tumor / Histology: Left Lower Lobe Lung  Patient presented after passing out at home and was seen at urgent care.  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 Metastatic left infrahilar and subcarinal adenopathy.  No findings for abdominal/pelvic metastatic disease or osseous metastatic disease.  Bronchoscopy 10/18/2020:   MRI Brain 10/16/2020: No evidence of intracranial metastatic disease.  CT CAP 10/15/2020: Irregular solid 3.8 x 3.0 cm central left lower lobe lung mass in addition to enlarged 1.4 cm infrahilar node.  There was also enlarged 2.1 cm subcarinal node.   Biopsies of LLL Lung 10/18/2020    Tobacco/Marijuana/Snuff/ETOH use: Former Smoker, quit 12/14/2019  Past/Anticipated interventions by cardiothoracic surgery, if any:   Past/Anticipated interventions by medical oncology, if any:  Dr. Julien Nordmann 10/21/2020 -I recommended for the patient to complete the staging work-up by ordering a PET scan to rule out any other metastatic disease. -If the patient has no evidence of metastatic disease outside the currently seen lesions, she will be treated with curative intention with a course of systemic chemotherapy with cisplatin 80 Mg/M2 on day 1 and 2 etoposide 100 Mg/M2 on days 1, 2 and 3 every 3 weeks.  This will be concurrent with radiotherapy for 6 weeks during the course of the chemotherapy.  This will also be followed by prophylactic cranial irradiation if the patient has no disease progression. -The patient is interested in proceeding with the treatment and she is expected to start the first cycle of her treatment on October 28, 2020.  -If the PET scan showed any concerning findings for progression, I will modify her treatment by replacing cisplatin with carboplatin and adding immunotherapy.   Signs/Symptoms Weight changes, if any: Gained about 20-30 pounds since she quit smoking last year. Respiratory  complaints, if any: Notes some SOB with increased activities, uses Albuterol inhaler. Hemoptysis, if any: Has productive cough with clear "sticky" phlegm.  Denies hemoptysis. Pain issues, if any: Denies chest pain, pressure, or tightness.   SAFETY ISSUES: Prior radiation? No Pacemaker/ICD? No Possible current pregnancy? Postmenopausal Is the patient on methotrexate? No  Current Complaints / other details:

## 2020-11-08 ENCOUNTER — Encounter: Payer: Self-pay | Admitting: Radiation Oncology

## 2020-11-08 ENCOUNTER — Ambulatory Visit
Admission: RE | Admit: 2020-11-08 | Discharge: 2020-11-08 | Disposition: A | Discharge status: Home / Self Care | Payer: Medicare HMO | Source: Ambulatory Visit | Attending: Radiation Oncology | Admitting: Radiation Oncology

## 2020-11-08 ENCOUNTER — Other Ambulatory Visit: Payer: Self-pay | Admitting: Physician Assistant

## 2020-11-08 ENCOUNTER — Inpatient Hospital Stay (HOSPITAL_BASED_OUTPATIENT_CLINIC_OR_DEPARTMENT_OTHER): Payer: Medicare HMO | Admitting: Physician Assistant

## 2020-11-08 ENCOUNTER — Other Ambulatory Visit: Payer: Self-pay

## 2020-11-08 ENCOUNTER — Inpatient Hospital Stay: Payer: Medicare HMO

## 2020-11-08 ENCOUNTER — Encounter: Payer: Self-pay | Admitting: Physician Assistant

## 2020-11-08 ENCOUNTER — Ambulatory Visit: Payer: Medicare HMO | Admitting: Physician Assistant

## 2020-11-08 VITALS — BP 147/72 | HR 72 | Temp 97.8°F | Resp 17 | Ht 66.0 in | Wt 184.2 lb

## 2020-11-08 VITALS — BP 144/77 | HR 71 | Temp 96.8°F | Resp 18 | Ht 66.0 in | Wt 184.0 lb

## 2020-11-08 DIAGNOSIS — Z5189 Encounter for other specified aftercare: Secondary | ICD-10-CM | POA: Insufficient documentation

## 2020-11-08 DIAGNOSIS — E538 Deficiency of other specified B group vitamins: Secondary | ICD-10-CM | POA: Diagnosis not present

## 2020-11-08 DIAGNOSIS — Z803 Family history of malignant neoplasm of breast: Secondary | ICD-10-CM | POA: Diagnosis not present

## 2020-11-08 DIAGNOSIS — E876 Hypokalemia: Secondary | ICD-10-CM

## 2020-11-08 DIAGNOSIS — K219 Gastro-esophageal reflux disease without esophagitis: Secondary | ICD-10-CM | POA: Insufficient documentation

## 2020-11-08 DIAGNOSIS — Z5111 Encounter for antineoplastic chemotherapy: Secondary | ICD-10-CM | POA: Insufficient documentation

## 2020-11-08 DIAGNOSIS — C3432 Malignant neoplasm of lower lobe, left bronchus or lung: Secondary | ICD-10-CM | POA: Insufficient documentation

## 2020-11-08 DIAGNOSIS — K589 Irritable bowel syndrome without diarrhea: Secondary | ICD-10-CM | POA: Diagnosis not present

## 2020-11-08 DIAGNOSIS — E559 Vitamin D deficiency, unspecified: Secondary | ICD-10-CM | POA: Insufficient documentation

## 2020-11-08 DIAGNOSIS — D701 Agranulocytosis secondary to cancer chemotherapy: Secondary | ICD-10-CM | POA: Insufficient documentation

## 2020-11-08 DIAGNOSIS — Z7901 Long term (current) use of anticoagulants: Secondary | ICD-10-CM | POA: Insufficient documentation

## 2020-11-08 DIAGNOSIS — F419 Anxiety disorder, unspecified: Secondary | ICD-10-CM | POA: Diagnosis not present

## 2020-11-08 DIAGNOSIS — G4700 Insomnia, unspecified: Secondary | ICD-10-CM | POA: Diagnosis not present

## 2020-11-08 DIAGNOSIS — Z8673 Personal history of transient ischemic attack (TIA), and cerebral infarction without residual deficits: Secondary | ICD-10-CM | POA: Insufficient documentation

## 2020-11-08 DIAGNOSIS — Z87891 Personal history of nicotine dependence: Secondary | ICD-10-CM | POA: Diagnosis not present

## 2020-11-08 DIAGNOSIS — I2510 Atherosclerotic heart disease of native coronary artery without angina pectoris: Secondary | ICD-10-CM | POA: Diagnosis not present

## 2020-11-08 DIAGNOSIS — I10 Essential (primary) hypertension: Secondary | ICD-10-CM | POA: Diagnosis not present

## 2020-11-08 DIAGNOSIS — E785 Hyperlipidemia, unspecified: Secondary | ICD-10-CM | POA: Diagnosis not present

## 2020-11-08 DIAGNOSIS — M47814 Spondylosis without myelopathy or radiculopathy, thoracic region: Secondary | ICD-10-CM | POA: Diagnosis not present

## 2020-11-08 DIAGNOSIS — T451X5A Adverse effect of antineoplastic and immunosuppressive drugs, initial encounter: Secondary | ICD-10-CM | POA: Insufficient documentation

## 2020-11-08 LAB — CBC WITH DIFFERENTIAL (CANCER CENTER ONLY)
Abs Immature Granulocytes: 0.04 10*3/uL (ref 0.00–0.07)
Basophils Absolute: 0 10*3/uL (ref 0.0–0.1)
Basophils Relative: 1 %
Eosinophils Absolute: 0 10*3/uL (ref 0.0–0.5)
Eosinophils Relative: 2 %
HCT: 37 % (ref 36.0–46.0)
Hemoglobin: 13 g/dL (ref 12.0–15.0)
Immature Granulocytes: 2 %
Lymphocytes Relative: 27 %
Lymphs Abs: 0.5 10*3/uL — ABNORMAL LOW (ref 0.7–4.0)
MCH: 34.8 pg — ABNORMAL HIGH (ref 26.0–34.0)
MCHC: 35.1 g/dL (ref 30.0–36.0)
MCV: 98.9 fL (ref 80.0–100.0)
Monocytes Absolute: 0 10*3/uL — ABNORMAL LOW (ref 0.1–1.0)
Monocytes Relative: 2 %
Neutro Abs: 1.3 10*3/uL — ABNORMAL LOW (ref 1.7–7.7)
Neutrophils Relative %: 66 %
Platelet Count: 164 10*3/uL (ref 150–400)
RBC: 3.74 MIL/uL — ABNORMAL LOW (ref 3.87–5.11)
RDW: 13.4 % (ref 11.5–15.5)
WBC Count: 2 10*3/uL — ABNORMAL LOW (ref 4.0–10.5)
nRBC: 0 % (ref 0.0–0.2)

## 2020-11-08 LAB — CMP (CANCER CENTER ONLY)
ALT: 32 U/L (ref 0–44)
AST: 18 U/L (ref 15–41)
Albumin: 3.6 g/dL (ref 3.5–5.0)
Alkaline Phosphatase: 85 U/L (ref 38–126)
Anion gap: 11 (ref 5–15)
BUN: 18 mg/dL (ref 8–23)
CO2: 31 mmol/L (ref 22–32)
Calcium: 8.4 mg/dL — ABNORMAL LOW (ref 8.9–10.3)
Chloride: 98 mmol/L (ref 98–111)
Creatinine: 0.9 mg/dL (ref 0.44–1.00)
GFR, Estimated: >60 mL/min (ref >60)
Glucose, Bld: 88 mg/dL (ref 70–99)
Potassium: 3.2 mmol/L — ABNORMAL LOW (ref 3.5–5.1)
Sodium: 140 mmol/L (ref 135–145)
Total Bilirubin: 0.4 mg/dL (ref 0.3–1.2)
Total Protein: 6.3 g/dL — ABNORMAL LOW (ref 6.5–8.1)

## 2020-11-08 LAB — MAGNESIUM: Magnesium: 1.2 mg/dL — ABNORMAL LOW (ref 1.7–2.4)

## 2020-11-08 MED ORDER — MAGNESIUM OXIDE -MG SUPPLEMENT 400 (240 MG) MG PO TABS: 400.0000 mg | ORAL_TABLET | Freq: Every day | ORAL | 1 refills | Status: DC

## 2020-11-08 MED ORDER — POTASSIUM CHLORIDE CRYS ER 20 MEQ PO TBCR
20.0000 meq | EXTENDED_RELEASE_TABLET | Freq: Every day | ORAL | 0 refills | Status: DC
Start: 2020-11-08 — End: 2020-12-25

## 2020-11-08 NOTE — Progress Notes (Signed)
Patricia [name]  Patricia Frees, MD Patricia [name] [id number]  DIAGNOSIS: Limited stage (T2 a, N 2, M0) small cell lung cancer presented with left lower lobe lung mass in addition to left infrahilar and subcarinal lymphadenopathy diagnosed in October 2022.  PRIOR THERAPY: None  CURRENT THERAPY: She is currently undergoing concurrent chemoradiation with cisplatin 80 mg per metered squared on days 1 and etoposide 100 mg per metered squared on days 1, 2, and 3 IV every 3 weeks. First dose 10/31/20.   INTERVAL HISTORY: Patricia [name] 70 y.o. female returns to the clinic today for a follow-up visit.  The patient is recently diagnosed with limited stage lung cancer.  She underwent her first cycle of treatment last week and she tolerated it well.  She noticed a sore throat/thrush on the Thursday following treatment without any signs and symptoms of infection including fever, nasal congestion, cough, chills, or malaise.  She thought they saw white dots in her throat.  The patient was evaluated in the symptomatically clinic on 11/04/2020 for possible thrush.  She was given a prescription for viscous lidocaine and nystatin.  She also was encouraged to use baking soda rinses to avoid mucositis.  She does have a prescription for Magic mouthwash.  The patient's symptoms have improved at this time.  Otherwise, she tolerated the first cycle of treatment well without any concerning adverse side effects except she noticed some decreased hearing bilaterally starting on Friday. Denies recent URI. Denies nasal congestion, ear pain, or drainage. She denies ever being evaluated by ENT previously.  She has some tinnitis.   She met with radiation oncology this morning and is scheduled for simulation on Friday. She denies any unexplained weight loss or night sweats. She denies fevers or chills. She reports a baseline cough coughing up white sticky phlegm. She has  some mild baseline dyspnea on exertion and states her inhalers help her.  Denies any chest pain or hemoptysis.  Denies any vomiting or diarrhea. She had mild nausea associated with certain smells last night after cooking dinner. She denies any headache or visual changes. She has chronic constipation and uses linzess. This is unchanged.  The patient recently had a staging brain MRI and PET scan performed.  She is here today for evaluation to review her results and for 1 week follow-up visit to manage any adverse side effects of treatment.    MEDICAL HISTORY: Past Medical History:  Diagnosis Date   Anxiety    GERD (gastroesophageal reflux disease)    Hyperlipidemia    Hypertension    IBS (irritable bowel syndrome)    Insomnia    Low back pain    Lung cancer ([place]) 10/2020   TIA (transient ischemic attack) 10/2017   no deficits   Vitamin B12 deficiency    Vitamin D deficiency     ALLERGIES:  is allergic to augmentin [amoxicillin-pot clavulanate], levaquin [levofloxacin in d5w], mom [magnesium hydroxide], and oxycodone.  MEDICATIONS:  Current Outpatient Medications  Medication Sig Dispense Refill   acetaminophen (TYLENOL) 325 MG tablet 1 tablet as needed.     budesonide-formoterol (SYMBICORT) 80-4.5 MCG/ACT inhaler Take 2 puffs first thing in am and then another 2 puffs about 12 hours later. 1 each 12   carboxymethylcellulose (REFRESH PLUS) 0.5 % SOLN Place 1 drop into both eyes 3 (three) times daily as needed (for dryness).      carisoprodol (SOMA) 350 MG tablet Take 350 mg by mouth  3 (three) times daily.   5   Cholecalciferol (VITAMIN D3) 50 MCG (2000 UT) TABS Take 2,000 Units by mouth daily.     clobetasol ointment (TEMOVATE) 3.90 % Apply 1 application topically See admin instructions. Apply to vaginal area daily as directed     clonazePAM (KLONOPIN) 1 MG tablet Take 1 mg by mouth at bedtime.   3   ELIQUIS 5 MG TABS tablet TAKE 1 TABLET(5 MG) BY MOUTH TWICE DAILY 60 tablet 6    fluticasone (FLONASE) 50 MCG/ACT nasal spray Place 2 sprays into both nostrils daily as needed for allergies or rhinitis.   5   furosemide (LASIX) 20 MG tablet Take 1 tablet (20 mg total) by mouth daily. 30 tablet 2   gabapentin (NEURONTIN) 600 MG tablet Take 600 mg by mouth See admin instructions. Take 600 mg by mouth in the morning and at lunchtime     hyoscyamine (LEVSIN) 0.125 MG tablet Take 0.125 mg by mouth as needed.     lidocaine (XYLOCAINE) 2 % solution Use as directed 15 mLs in the mouth or throat every 6 (six) hours as needed for mouth pain. 100 mL 0   linaclotide (LINZESS) 145 MCG CAPS capsule Take 1 capsule (145 mcg total) by mouth daily at 4 PM. 30 capsule 0   magic mouthwash SOLN Take 5 mLs by mouth 4 (four) times daily as needed for mouth pain. Pt is allergic to Magnesium 240 mL 1   magnesium oxide (MAG-OX) 400 (240 Mg) MG tablet Take 1 tablet (400 mg total) by mouth daily. 30 tablet 1   metoprolol tartrate (LOPRESSOR) 50 MG tablet Take 1 tablet (50 mg total) by mouth 2 (two) times daily. 60 tablet 0   omeprazole (PRILOSEC) 20 MG capsule Take 20 mg by mouth daily before breakfast.      ondansetron (ZOFRAN) 4 MG tablet Take 1 tablet (4 mg total) by mouth every 8 (eight) hours as needed for nausea or vomiting. 30 tablet 1   ondansetron (ZOFRAN-ODT) 8 MG disintegrating tablet Take 8 mg by mouth 3 (three) times daily.     polyethylene glycol (MIRALAX / GLYCOLAX) packet Take 34 g by mouth in the morning.      potassium chloride SA (KLOR-CON) 20 MEQ tablet Take 1 tablet (20 mEq total) by mouth daily. 5 tablet 0   prochlorperazine (COMPAZINE) 10 MG tablet Take 1 tablet (10 mg total) by mouth every 6 (six) hours as needed for nausea or vomiting. 30 tablet 0   rosuvastatin (CRESTOR) 5 MG tablet Take 5 mg by mouth 3 (three) times a week. Mondays Wednesdays Fridays     sodium chloride 1 g tablet Take 2 tablets (2 g total) by mouth 3 (three) times daily with meals. (Patient taking differently:  Take 1 g by mouth 3 (three) times daily with meals.) 180 tablet 0   traMADol (ULTRAM) 50 MG tablet Take 100 mg by mouth as needed.     VENTOLIN HFA 108 (90 Base) MCG/ACT inhaler Inhale 2 puffs into the lungs every 6 (six) hours as needed for wheezing or shortness of breath.   5   zaleplon (SONATA) 10 MG capsule Take 10 mg by mouth at bedtime as needed (for interrupted sleep).      No current facility-administered medications for this visit.    SURGICAL HISTORY:  Past Surgical History:  Procedure Laterality Date   BACK SURGERY     BREAST EXCISIONAL BIOPSY Left 1997   benign   BRONCHIAL BIOPSY  10/18/2020   Procedure: BRONCHIAL BIOPSIES;  Surgeon: [name], DO;  Location: [facility];  Service: Pulmonary;;   BRONCHIAL BRUSHINGS  10/18/2020   Procedure: BRONCHIAL BRUSHINGS;  Surgeon: [name], DO;  Location: [facility];  Service: Pulmonary;;   BRONCHIAL NEEDLE ASPIRATION BIOPSY  10/18/2020   Procedure: BRONCHIAL NEEDLE ASPIRATION BIOPSIES;  Surgeon: [name], DO;  Location: [facility];  Service: Pulmonary;;   BRONCHIAL WASHINGS  10/18/2020   Procedure: BRONCHIAL WASHINGS;  Surgeon: [name], DO;  Location: [facility];  Service: Pulmonary;;   hemorrhoidecotmy     HERNIA MESH REMOVAL     OPEN REDUCTION INTERNAL FIXATION (ORIF) DISTAL RADIAL FRACTURE Right 11/19/2019   Procedure: OPEN REDUCTION INTERNAL FIXATION (ORIF) DISTAL RADIUS AND ULNA FRACTURE WITH REPAIR AS NECESSARY;  Surgeon: Roseanne Kaufman, MD;  Location: [facility];  Service: Orthopedics;  Laterality: Right;  2 hrs Block with IV sedation   ORIF RADIUS & ULNA FRACTURES     TONSILLECTOMY     VIDEO BRONCHOSCOPY WITH ENDOBRONCHIAL NAVIGATION Left 10/18/2020   Procedure: VIDEO BRONCHOSCOPY WITH ENDOBRONCHIAL NAVIGATION;  Surgeon: [name], DO;  Location: [facility];  Service: Pulmonary;  Laterality: Left;  ION   VIDEO BRONCHOSCOPY WITH ENDOBRONCHIAL ULTRASOUND Bilateral 10/18/2020    Procedure: VIDEO BRONCHOSCOPY WITH ENDOBRONCHIAL ULTRASOUND;  Surgeon: [name], DO;  Location: [facility];  Service: Pulmonary;  Laterality: Bilateral;   VIDEO BRONCHOSCOPY WITH RADIAL ENDOBRONCHIAL ULTRASOUND  10/18/2020   Procedure: VIDEO BRONCHOSCOPY WITH RADIAL ENDOBRONCHIAL ULTRASOUND;  Surgeon: [name], DO;  Location: [facility];  Service: Pulmonary;;    REVIEW OF SYSTEMS:   Review of Systems  Constitutional: negative for appetite change, chills, fatigue, fever and unexpected weight change.  HENT: Positive for decreased hearing bilaterally.  Negative for mouth sores, nosebleeds, sore throat (improved) and trouble swallowing.   Eyes: Negative for eye problems and icterus.  Respiratory: Positive for stable cough and dyspnea on exertion.  Negative for hemoptysis and wheezing.   Cardiovascular: Negative for chest pain and leg swelling.  Gastrointestinal: Positive for mild nausea x1.  Positive for chronic constipation.  Negative for abdominal pain, diarrhea, and vomiting.  Genitourinary: Negative for bladder incontinence, difficulty urinating, dysuria, frequency and hematuria.   Musculoskeletal: Negative for back pain, gait problem, neck pain and neck stiffness.  Skin: Negative for itching and rash.  Neurological: Negative for dizziness, extremity weakness, gait problem, headaches, light-headedness and seizures.  Hematological: Negative for adenopathy. Does not bruise/bleed easily.  Psychiatric/Behavioral: Negative for confusion, depression and sleep disturbance. The patient is not nervous/anxious.     PHYSICAL EXAMINATION:  Blood pressure (!) 147/72, pulse 72, temperature 97.8 F (36.6 C), temperature source Oral, resp. rate 17, height '5\' 6"'  (1.676 m), weight 184 lb 3.2 oz (83.6 kg), SpO2 96 %.  ECOG PERFORMANCE STATUS: 1  Physical Exam  Constitutional: Oriented to person, place, and time and well-developed, well-nourished, and in no distress.  HENT:  Head:  Normocephalic and atraumatic.  Mouth/Throat: Oropharynx is clear and moist. No oropharyngeal exudate.  Eyes: Conjunctivae are normal. Right eye exhibits no discharge. Left eye exhibits no discharge. No scleral icterus.  Neck: Normal range of motion. Neck supple.  Cardiovascular: Normal rate, regular rhythm, normal heart sounds and intact distal pulses.   Pulmonary/Chest: Effort normal.  Positive for quiet breath sounds bilaterally.  No respiratory distress. No wheezes. No rales.  Abdominal: Soft. Bowel sounds are normal. Exhibits no distension and no mass. There is no tenderness.  Musculoskeletal: Normal range  of motion. Exhibits no edema.  Lymphadenopathy:    No cervical adenopathy.  Neurological: Alert and oriented to person, place, and time. Exhibits normal muscle tone. Gait normal. Coordination normal.  Skin: Skin is warm and dry. No rash noted. Not diaphoretic. No erythema. No pallor.  Psychiatric: Mood, memory and judgment normal.  Vitals reviewed.  LABORATORY DATA: Lab Results  Component Value Date   WBC 2.0 (L) 11/08/2020   HGB 13.0 11/08/2020   HCT 37.0 11/08/2020   MCV 98.9 11/08/2020   PLT 164 11/08/2020      Chemistry      Component Value Date/Time   NA 140 11/08/2020 0817   K 3.2 (L) 11/08/2020 0817   CL 98 11/08/2020 0817   CO2 31 11/08/2020 0817   BUN 18 11/08/2020 0817   CREATININE 0.90 11/08/2020 0817      Component Value Date/Time   CALCIUM 8.4 (L) 11/08/2020 0817   ALKPHOS 85 11/08/2020 0817   AST 18 11/08/2020 0817   ALT 32 11/08/2020 0817   BILITOT 0.4 11/08/2020 0817       RADIOGRAPHIC STUDIES:  MR BRAIN W WO CONTRAST  Result Date: 10/16/2020 CLINICAL DATA:  Non-small cell lung cancer, staging. EXAM: MRI HEAD WITHOUT AND WITH CONTRAST TECHNIQUE: Multiplanar, multiecho pulse sequences of the brain and surrounding structures were obtained without and with intravenous contrast. CONTRAST:  7.68m GADAVIST GADOBUTROL 1 MMOL/ML IV SOLN COMPARISON:  CT  09/30/2020.  Brain MRI 10/16/2017. FINDINGS: Brain: Mild generalized cerebral and cerebellar atrophy. Mild multifocal T2 FLAIR hyperintense signal abnormality within the cerebral white matter, nonspecific but compatible with chronic small vessel ischemic disease. There is no acute infarct. No evidence of an intracranial mass. No chronic intracranial blood products. No extra-axial fluid collection. No midline shift. No pathologic intracranial enhancement is demonstrated to suggest intracranial metastatic disease. Vascular: Maintained flow voids within the proximal large arterial vessels. Skull and upper cervical spine: No focal suspicious marrow lesion. Sinuses/Orbits: Visualized orbits show no acute finding. Mild mucosal thickening versus small volume fluid within the anterior right ethmoid air cells. Other: Trace fluid within the right greater than left mastoid air cells. IMPRESSION: No evidence of intracranial metastatic disease. Mild chronic small vessel ischemic changes within the cerebral white matter. Mild generalized parenchymal atrophy. Mild right ethmoid sinusitis. Trace fluid within the bilateral mastoid air cells. Electronically Signed   By: KKellie SimmeringD.O.   On: 10/16/2020 16:17   CT CHEST ABDOMEN PELVIS W CONTRAST  Result Date: 10/15/2020 CLINICAL DATA:  Inpatient. Severe hyponatremia in a previous smoker. 20 pound weight gain in 1 year. EXAM: CT CHEST, ABDOMEN, AND PELVIS WITH CONTRAST TECHNIQUE: Multidetector CT imaging of the chest, abdomen and pelvis was performed following the standard protocol during bolus administration of intravenous contrast. CONTRAST:  863mOMNIPAQUE IOHEXOL 350 MG/ML SOLN COMPARISON:  11/07/2008 CT chest, abdomen and pelvis. FINDINGS: CT CHEST FINDINGS Cardiovascular: Normal heart size. No significant pericardial effusion/thickening. Three-vessel coronary atherosclerosis. Atherosclerotic nonaneurysmal thoracic aorta. Normal caliber pulmonary arteries. No central  pulmonary emboli. Mediastinum/Nodes: No discrete thyroid nodules. Unremarkable esophagus. No axillary adenopathy. Enlarged 2.1 cm subcarinal node (series 2/image 29). No additional pathologically enlarged mediastinal nodes. No right hilar adenopathy. Enlarged 1.4 cm left infrahilar node (series 2/image 37). Lungs/Pleura: No pneumothorax. No pleural effusion. Moderate to severe centrilobular emphysema with mild diffuse bronchial wall thickening. Irregular solid 3.8 x 3.0 cm central left lower lobe lung mass (series 4/image 105), new. No additional significant pulmonary nodules. Musculoskeletal: No aggressive appearing focal osseous lesions.  Mild thoracic spondylosis. CT ABDOMEN PELVIS FINDINGS Hepatobiliary: Liver surface appears finely irregular, cannot exclude cirrhosis. No liver masses. Normal gallbladder with no radiopaque cholelithiasis. No biliary ductal dilatation. Pancreas: Normal, with no mass or duct dilation. Spleen: Normal size. No mass. Adrenals/Urinary Tract: Normal adrenals. Subcentimeter hypodense lower right and interpolar left renal cortical lesions are too small to characterize and require no follow-up. No suspicious renal masses. No hydronephrosis. Normal bladder. Stomach/Bowel: Normal non-distended stomach. Normal caliber small bowel with no small bowel wall thickening. Normal appendix. Normal large bowel with no diverticulosis, large bowel wall thickening or pericolonic fat stranding. Vascular/Lymphatic: Atherosclerotic nonaneurysmal abdominal aorta. Patent portal, splenic, hepatic and renal veins. No pathologically enlarged lymph nodes in the abdomen or pelvis. Reproductive: Grossly normal uterus.  No adnexal mass. Other: No pneumoperitoneum, ascites or focal fluid collection. Musculoskeletal: No aggressive appearing focal osseous lesions. Chronic moderate L2 vertebral compression fracture status post vertebroplasty. Mild lumbar spondylosis. IMPRESSION: 1. Irregular solid 3.8 cm central left  lower lobe lung mass, new since 2010 comparison chest CT, highly suspicious for primary bronchogenic carcinoma. 2. Left infrahilar and subcarinal lymphadenopathy, suspicious for nodal metastases. 3. Multidisciplinary thoracic oncology consultation and PET-CT suggested. 4. Liver surface appears finely irregular, cannot exclude cirrhosis. No liver masses. Suggest correlation with liver function tests. Consider outpatient hepatic elastography for further liver fibrosis risk stratification, as clinically warranted. 5. Three-vessel coronary atherosclerosis. 6. Aortic Atherosclerosis (ICD10-I70.0). Electronically Signed   By: Ilona Sorrel M.D.   On: 10/15/2020 15:30   NM PET Image Initial (PI) Skull Base To Thigh  Result Date: 11/07/2020 CLINICAL DATA:  Initial treatment strategy for small cell lung cancer. EXAM: NUCLEAR MEDICINE PET SKULL BASE TO THIGH TECHNIQUE: 9.57 mCi F-18 FDG was injected intravenously. Full-ring PET imaging was performed from the skull base to thigh after the radiotracer. CT data was obtained and used for attenuation correction and anatomic localization. Fasting blood glucose: 113 mg/dl COMPARISON:  CT scan 10/15/2020 FINDINGS: Mediastinal blood pool activity: SUV max 2.89 Liver activity: SUV max NA NECK: No hypermetabolic lymph nodes in the neck. Incidental CT findings: Bilateral carotid artery calcifications are noted. CHEST: The left lower lobe pulmonary lesion is hypermetabolic with SUV max of 3.22. Adjacent left infrahilar lymph node is also hypermetabolic with SUV max of 0.25. Subcarinal lymph node is hypermetabolic with SUV max of 4.27. No other enlarged or hypermetabolic mediastinal hilar lymph nodes. No other hypermetabolic pulmonary lesions are identified. No worrisome pulmonary nodules on the CT scan to suggest pulmonary metastatic disease. No breast masses, supraclavicular or axillary adenopathy. Incidental CT findings: Stable emphysematous changes and areas of pulmonary scarring.  Stable aortic and coronary artery calcifications. ABDOMEN/PELVIS: No findings for abdominal/pelvic metastatic disease. No hepatic adrenal gland lesions. No enlarged or hypermetabolic lymph nodes. Incidental CT findings: Moderate to advanced atherosclerotic calcifications involving the aorta and branch vessels but no aneurysm. SKELETON: No findings for osseous metastatic disease. Uptake in the upper chest and not muscles is likely physiologic. Incidental CT findings: none IMPRESSION: 1. Hypermetabolic left lower lobe lesion consistent with known pulmonary neoplasm. Metastatic left infrahilar and subcarinal adenopathy. 2. No findings for abdominal/pelvic metastatic disease or osseous metastatic disease. Electronically Signed   By: Marijo Sanes M.D.   On: 11/07/2020 10:18   DG CHEST PORT 1 VIEW  Result Date: 10/18/2020 CLINICAL DATA:  Status post bronchoscopy. EXAM: PORTABLE CHEST 1 VIEW COMPARISON:  September 30, 2020. FINDINGS: The heart size and mediastinal contours are within normal limits. Both lungs are clear. No pneumothorax or  pleural effusion is noted. The visualized skeletal structures are unremarkable. IMPRESSION: No active disease. Aortic Atherosclerosis (ICD10-I70.0). Electronically Signed   By: Marijo Conception M.D.   On: 10/18/2020 12:41   DG C-ARM BRONCHOSCOPY  Result Date: 10/18/2020 C-ARM BRONCHOSCOPY: Fluoroscopy was utilized by the requesting physician.  No radiographic interpretation.     ASSESSMENT/PLAN:  This is a very pleasant 69 year old Caucasian female recently diagnosed with limited stage (T2 a, N 2, M0) small cell lung cancer presented with left lower lobe lung mass in addition to left infrahilar and subcarinal lymphadenopathy diagnosed in October 2022.  She is currently undergoing concurrent chemoradiation with cisplatin 80 mg per metered squared on days 1 and etoposide 100 mg per metered squared on days 1, 2, and 3 IV every 3 weeks.  She is status post her first cycle.   She tolerated it well.   The patient was also seen with Dr. Julien Nordmann who personally independently reviewed her PET scan and discussed results with the patient today.  The scan confirmed limited stage disease with the left lower lobe lung mass and infrahilar and subcarinal adenopathy.  Mohamed recommend that she continue with the same treatment at the same dose.  We will see her back for follow-up visit in 2 weeks for evaluation before starting cycle #2.  The patient's lab work today shows hypomagnesemia.  The patient reports that she tried over-the-counter magnesium supplements in the past and developed itching.  She states that she tried magnesium citrate for constipation a few years ago and noted she had hives the following day.  Reviewed with Dr. Julien Nordmann.  I will send her prescription for magnesium oxide 400 mg p.o. daily to her pharmacy.  If she develops any signs of allergies, she was advised to take Benadryl and to call us for further instruction.  Her potassium is also slightly low at 3.2.  I sent 20 meq of potassium to take p.o. daily for 5 days.  Regarding her hearing, she does not have any ear pain or symptoms of upper respiratory infection.  I reviewed with Dr. Julien Nordmann given her cisplatin use.  No abnormalities were appreciated on exam except for a focal area of ear canal irritation which may be from her Q-tip use.  Dr. Julien Nordmann mentioned that it be uncommon to develop ototoxicity with 1 dose of cisplatin but we will need to monitor it closely.  She was advised to monitor her hearing closely over the next few weeks.  If her hearing does not improve, we may consider referring her to audiology.  The patient's [place] is 1.3 today.  I reviewed neutropenic precautions with the patient.  She was advised to call us if she develops any signs and symptoms of infection including fever, chills, sore throat, nasal congestion, upper respiratory infection, skin infections, abdominal pain, diarrhea, or dysuria.   She does not have any signs or symptoms of infection today.   The patient was advised to call immediately if she has any concerning symptoms in the interval. The patient voices understanding of current disease status and treatment options and is in agreement with the current care plan. All questions were answered. The patient knows to call the clinic with any problems, questions or concerns. We can certainly see the patient much sooner if necessary       No orders of the defined types were placed in this encounter.    Nyree Yonker L Janylah Belgrave, PA-C 11/08/20  ADDENDUM: Hematology/Oncology Attending: I had a face-to-face encounter with  the patient today.  I reviewed her record, lab and recommended her care plan.  This is a very pleasant 70 years old white female recently diagnosed with limited stage (T2 a, N2, M0) non-small cell lung cancer, presented with left lower lobe lung mass in addition to left hilar and subcarinal lymphadenopathy diagnosed in October 2022.  The patient is currently undergoing systemic chemotherapy with cisplatin and etoposide status post 1 cycle started last week.  She tolerated the first week of her treatment fairly well with no concerning adverse effect except for some ringing in her ear as well as mild fatigue. She is expected to start concurrent radiotherapy soon under the care of Dr. Lisbeth Renshaw. I recommended for the patient to continue her treatment with the same regiment as planned and she is expected to start cycle #2 in 2 weeks. For the hypokalemia, we will provide the patient with potassium supplements. The patient was advised to call immediately if she has any other concerning symptoms in the interval.  Disclaimer: This [name] was dictated with voice recognition software. Similar sounding words can inadvertently be transcribed and may be missed upon review.  Eilleen Kempf, MD 11/08/20

## 2020-11-08 NOTE — Progress Notes (Signed)
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   HISTORY OF PRESENT ILLNESS: Patricia [name] is a 70 y.o. female seen at the request of Dr. Valeta [name] for a newly diagnosed lung cancer. The patient originally was worked up after an episode of syncope that led to a CT head on 10/15/20 that was negative for disease. A CT CAP on 10/15/20 showed a 3.8 cm left lower lobe lung mass and left infrahilar and subcarinal adenopathy suspicious for nodal metastases. Finely irregular changes along the liver surface were seen, and stigmata of atherosclerosis was noted. An MRI brain on 10/16/20 was negative for disease. She underwent bronchoscopy with EBUS on 10/18/20 identified malignant cells consistent with small cell carcinoma of the LLL as well as brushings, LLL lavage also positive, and small cell carcinoma at station 7 FNA. A PET scan on 11/04/20 showed hypermetabolic left lower lobe lesion consistent with known disease as well as hypermetabolic left infrahilar and subcarinal adenopathy, no metastatic disease was noted. She started cisplatin/etoposide chemotherapy on 10/31/20. She is seen today to discuss chemoRT.    PREVIOUS RADIATION THERAPY: No   PAST MEDICAL HISTORY:  Past Medical History:  Diagnosis Date   Anxiety    GERD (gastroesophageal reflux disease)    Hyperlipidemia    Hypertension    IBS (irritable bowel syndrome)    Insomnia    Low back pain    Lung cancer ([place]) 10/2020   TIA (transient ischemic attack) 10/2017   no deficits   Vitamin B12 deficiency    Vitamin D deficiency        PAST SURGICAL HISTORY: Past Surgical History:  Procedure Laterality Date   BACK  SURGERY     BREAST EXCISIONAL BIOPSY Left 1997   benign   BRONCHIAL BIOPSY  10/18/2020   Procedure: BRONCHIAL BIOPSIES;  Surgeon: Patricia Nash, DO;  Location: [facility];  Service: Pulmonary;;   BRONCHIAL BRUSHINGS  10/18/2020   Procedure: BRONCHIAL BRUSHINGS;  Surgeon: Patricia Nash, DO;  Location: [facility];  Service: Pulmonary;;   BRONCHIAL NEEDLE ASPIRATION BIOPSY  10/18/2020   Procedure: BRONCHIAL NEEDLE ASPIRATION BIOPSIES;  Surgeon: Patricia Nash, DO;  Location: [facility];  Service: Pulmonary;;   BRONCHIAL WASHINGS  10/18/2020   Procedure: BRONCHIAL WASHINGS;  Surgeon: Patricia Nash, DO;  Location: [facility];  Service: Pulmonary;;   hemorrhoidecotmy     HERNIA MESH REMOVAL     OPEN REDUCTION INTERNAL FIXATION (ORIF) DISTAL RADIAL FRACTURE Right 11/19/2019   Procedure: OPEN REDUCTION INTERNAL FIXATION (ORIF) DISTAL RADIUS AND ULNA FRACTURE WITH REPAIR AS NECESSARY;  Surgeon: Patricia Kaufman, MD;  Location: [facility];  Service: Orthopedics;  Laterality: Right;  2 hrs Block with IV sedation   ORIF RADIUS & ULNA FRACTURES     TONSILLECTOMY     VIDEO BRONCHOSCOPY WITH ENDOBRONCHIAL NAVIGATION Left 10/18/2020   Procedure: VIDEO BRONCHOSCOPY WITH ENDOBRONCHIAL NAVIGATION;  Surgeon: Patricia Nash, DO;  Location: [facility];  Service: Pulmonary;  Laterality: Left;  ION   VIDEO BRONCHOSCOPY WITH ENDOBRONCHIAL ULTRASOUND Bilateral 10/18/2020   Procedure: VIDEO BRONCHOSCOPY WITH ENDOBRONCHIAL ULTRASOUND;  Surgeon: Patricia Nash, DO;  Location:  [facility];  Service: Pulmonary;  Laterality: Bilateral;   VIDEO BRONCHOSCOPY WITH RADIAL ENDOBRONCHIAL ULTRASOUND  10/18/2020   Procedure: VIDEO BRONCHOSCOPY WITH RADIAL ENDOBRONCHIAL ULTRASOUND;  Surgeon: Patricia Nash, DO;  Location: [facility];  Service: Pulmonary;;     FAMILY HISTORY:  Family History  Problem Relation Age of Onset   Breast cancer Sister    Breast cancer Maternal Aunt    Breast cancer Paternal  Aunt      SOCIAL HISTORY:  reports that she quit smoking about 10 months ago. Her smoking use included cigarettes. She started smoking about 50 years ago. She has a 50.00 pack-year smoking history. She has never used smokeless tobacco. She reports current alcohol use. She reports that she does not use drugs. The patient is married and lives in [place]. She is retired from working as a  travel Lawyer. She's accompanied by her husband.   ALLERGIES: Augmentin [amoxicillin-pot clavulanate], Levaquin [levofloxacin in d5w], Mom [magnesium hydroxide], and Oxycodone   MEDICATIONS:  Current Outpatient Medications  Medication Sig Dispense Refill   acetaminophen (TYLENOL) 325 MG tablet 1 tablet as needed.     budesonide-formoterol (SYMBICORT) 80-4.5 MCG/ACT inhaler Take 2 puffs first thing in am and then another 2 puffs about 12 hours later. 1 each 12   carboxymethylcellulose (REFRESH PLUS) 0.5 % SOLN Place 1 drop into both eyes 3 (three) times daily as needed (for dryness).      carisoprodol (SOMA) 350 MG tablet Take 350 mg by mouth 3 (three) times daily.   5   Cholecalciferol (VITAMIN D3) 50 MCG (2000 UT) TABS Take 2,000 Units by mouth daily.     clobetasol ointment (TEMOVATE) 8.65 % Apply 1 application topically See admin instructions. Apply to vaginal area daily as directed     clonazePAM (KLONOPIN) 1 MG tablet Take 1 mg by mouth at bedtime.   3   ELIQUIS 5 MG TABS tablet TAKE 1 TABLET(5 MG) BY MOUTH TWICE DAILY 60 tablet 6   fluticasone (FLONASE) 50 MCG/ACT nasal spray Place 2 sprays into both nostrils daily as needed for allergies or rhinitis.   5   furosemide (LASIX) 20 MG tablet Take 1 tablet (20 mg total) by mouth daily. 30 tablet 2   gabapentin (NEURONTIN) 600 MG tablet Take 600 mg by mouth See admin instructions. Take 600 mg by mouth in the morning and at lunchtime     hyoscyamine (LEVSIN) 0.125 MG tablet Take 0.125 mg by mouth as needed.     lidocaine  (XYLOCAINE) 2 % solution Use as directed 15 mLs in the mouth or throat every 6 (six) hours as needed for mouth pain. 100 mL 0   linaclotide (LINZESS) 145 MCG CAPS capsule Take 1 capsule (145 mcg total) by mouth daily at 4 PM. 30 capsule 0   magic mouthwash SOLN Take 5 mLs by mouth 4 (four) times daily as needed for mouth pain. Pt is allergic to Magnesium 240 mL 1   metoprolol tartrate (LOPRESSOR) 50 MG tablet Take 1 tablet (50 mg total) by mouth 2 (two) times daily. 60 tablet 0   omeprazole (PRILOSEC) 20 MG capsule Take 20 mg by mouth daily before breakfast.      ondansetron (ZOFRAN) 4 MG tablet Take 1 tablet (4 mg total) by mouth every 8 (eight) hours as needed for nausea or vomiting. 30 tablet 1   ondansetron (ZOFRAN-ODT) 8 MG disintegrating tablet Take 8 mg by mouth 3 (three) times daily.  polyethylene glycol (MIRALAX / GLYCOLAX) packet Take 34 g by mouth in the morning.      prochlorperazine (COMPAZINE) 10 MG tablet Take 1 tablet (10 mg total) by mouth every 6 (six) hours as needed for nausea or vomiting. 30 tablet 0   rosuvastatin (CRESTOR) 5 MG tablet Take 5 mg by mouth 3 (three) times a week. Mondays Wednesdays Fridays     sodium chloride 1 g tablet Take 2 tablets (2 g total) by mouth 3 (three) times daily with meals. (Patient taking differently: Take 1 g by mouth 3 (three) times daily with meals.) 180 tablet 0   traMADol (ULTRAM) 50 MG tablet Take 100 mg by mouth as needed.     VENTOLIN HFA 108 (90 Base) MCG/ACT inhaler Inhale 2 puffs into the lungs every 6 (six) hours as needed for wheezing or shortness of breath.   5   zaleplon (SONATA) 10 MG capsule Take 10 mg by mouth at bedtime as needed (for interrupted sleep).      No current facility-administered medications for this encounter.     REVIEW OF SYSTEMS: On review of systems, the patient reports that she is doing well overall. She reports her breathing has been good. She does have some shortness of breath however with increased  activities. She has productive mucous when coughing. She has gained 20 pounds since quitting smoking last year. She denies any hemoptysis. No chest pain is noted.   PHYSICAL EXAM:  Wt Readings from Last 3 Encounters:  11/08/20 184 lb (83.5 kg)  11/04/20 188 lb (85.3 kg)  11/03/20 192 lb (87.1 kg)   Temp Readings from Last 3 Encounters:  11/08/20 (!) 96.8 F (36 C) (Temporal)  11/04/20 98.5 F (36.9 C) (Oral)  11/02/20 98.3 F (36.8 C) (Oral)   BP Readings from Last 3 Encounters:  11/08/20 (!) 144/77  11/04/20 (!) 159/88  11/03/20 118/68   Pulse Readings from Last 3 Encounters:  11/08/20 71  11/04/20 70  11/03/20 (!) 55   Pain Assessment Pain Score: 0-No pain/10  In general this is a well appearing caucasian female in no acute distress. She's alert and oriented x4 and appropriate throughout the examination. Cardiopulmonary assessment is negative for acute distress and she exhibits normal effort.     ECOG = 1  0 - Asymptomatic (Fully active, able to carry on all predisease activities without restriction)  1 - Symptomatic but completely ambulatory (Restricted in physically strenuous activity but ambulatory and able to carry out work of a light or sedentary nature. For example, light housework, office work)  2 - Symptomatic, <50% in bed during the day (Ambulatory and capable of all self care but unable to carry out any work activities. Up and about more than 50% of waking hours)  3 - Symptomatic, >50% in bed, but not bedbound (Capable of only limited self-care, confined to bed or chair 50% or more of waking hours)  4 - Bedbound (Completely disabled. Cannot carry on any self-care. Totally confined to bed or chair)  5 - Death   Eustace Pen MM, Creech RH, Tormey DC, et al. [phone]). "Toxicity and response criteria of the [facility]". [place] Oncol. 5 (6): 649-55    LABORATORY DATA:  Lab Results  Component Value Date   WBC 2.0 (L) 11/08/2020   HGB  13.0 11/08/2020   HCT 37.0 11/08/2020   MCV 98.9 11/08/2020   PLT 164 11/08/2020   Lab Results  Component Value Date   NA 140  11/08/2020   K 3.2 (L) 11/08/2020   CL 98 11/08/2020   CO2 31 11/08/2020   Lab Results  Component Value Date   ALT 32 11/08/2020   AST 18 11/08/2020   ALKPHOS 85 11/08/2020   BILITOT 0.4 11/08/2020      RADIOGRAPHY: MR BRAIN W WO CONTRAST  Result Date: 10/16/2020 CLINICAL DATA:  Non-small cell lung cancer, staging. EXAM: MRI HEAD WITHOUT AND WITH CONTRAST TECHNIQUE: Multiplanar, multiecho pulse sequences of the brain and surrounding structures were obtained without and with intravenous contrast. CONTRAST:  7.5mL GADAVIST GADOBUTROL 1 MMOL/ML IV SOLN COMPARISON:  CT 09/30/2020.  Brain MRI 10/16/2017. FINDINGS: Brain: Mild generalized cerebral and cerebellar atrophy. Mild multifocal T2 FLAIR hyperintense signal abnormality within the cerebral white matter, nonspecific but compatible with chronic small vessel ischemic disease. There is no acute infarct. No evidence of an intracranial mass. No chronic intracranial blood products. No extra-axial fluid collection. No midline shift. No pathologic intracranial enhancement is demonstrated to suggest intracranial metastatic disease. Vascular: Maintained flow voids within the proximal large arterial vessels. Skull and upper cervical spine: No focal suspicious marrow lesion. Sinuses/Orbits: Visualized orbits show no acute finding. Mild mucosal thickening versus small volume fluid within the anterior right ethmoid air cells. Other: Trace fluid within the right greater than left mastoid air cells. IMPRESSION: No evidence of intracranial metastatic disease. Mild chronic small vessel ischemic changes within the cerebral white matter. Mild generalized parenchymal atrophy. Mild right ethmoid sinusitis. Trace fluid within the bilateral mastoid air cells. Electronically Signed   By: Kellie Simmering D.O.   On: 10/16/2020 16:17   CT CHEST  ABDOMEN PELVIS W CONTRAST  Result Date: 10/15/2020 CLINICAL DATA:  Inpatient. Severe hyponatremia in a previous smoker. 20 pound weight gain in 1 year. EXAM: CT CHEST, ABDOMEN, AND PELVIS WITH CONTRAST TECHNIQUE: Multidetector CT imaging of the chest, abdomen and pelvis was performed following the standard protocol during bolus administration of intravenous contrast. CONTRAST:  26mL OMNIPAQUE IOHEXOL 350 MG/ML SOLN COMPARISON:  11/07/2008 CT chest, abdomen and pelvis. FINDINGS: CT CHEST FINDINGS Cardiovascular: Normal heart size. No significant pericardial effusion/thickening. Three-vessel coronary atherosclerosis. Atherosclerotic nonaneurysmal thoracic aorta. Normal caliber pulmonary arteries. No central pulmonary emboli. Mediastinum/Nodes: No discrete thyroid nodules. Unremarkable esophagus. No axillary adenopathy. Enlarged 2.1 cm subcarinal node (series 2/image 29). No additional pathologically enlarged mediastinal nodes. No right hilar adenopathy. Enlarged 1.4 cm left infrahilar node (series 2/image 37). Lungs/Pleura: No pneumothorax. No pleural effusion. Moderate to severe centrilobular emphysema with mild diffuse bronchial wall thickening. Irregular solid 3.8 x 3.0 cm central left lower lobe lung mass (series 4/image 105), new. No additional significant pulmonary nodules. Musculoskeletal: No aggressive appearing focal osseous lesions. Mild thoracic spondylosis. CT ABDOMEN PELVIS FINDINGS Hepatobiliary: Liver surface appears finely irregular, cannot exclude cirrhosis. No liver masses. Normal gallbladder with no radiopaque cholelithiasis. No biliary ductal dilatation. Pancreas: Normal, with no mass or duct dilation. Spleen: Normal size. No mass. Adrenals/Urinary Tract: Normal adrenals. Subcentimeter hypodense lower right and interpolar left renal cortical lesions are too small to characterize and require no follow-up. No suspicious renal masses. No hydronephrosis. Normal bladder. Stomach/Bowel: Normal  non-distended stomach. Normal caliber small bowel with no small bowel wall thickening. Normal appendix. Normal large bowel with no diverticulosis, large bowel wall thickening or pericolonic fat stranding. Vascular/Lymphatic: Atherosclerotic nonaneurysmal abdominal aorta. Patent portal, splenic, hepatic and renal veins. No pathologically enlarged lymph nodes in the abdomen or pelvis. Reproductive: Grossly normal uterus.  No adnexal mass. Other: No pneumoperitoneum, ascites or focal fluid  collection. Musculoskeletal: No aggressive appearing focal osseous lesions. Chronic moderate L2 vertebral compression fracture status post vertebroplasty. Mild lumbar spondylosis. IMPRESSION: 1. Irregular solid 3.8 cm central left lower lobe lung mass, new since 2010 comparison chest CT, highly suspicious for primary bronchogenic carcinoma. 2. Left infrahilar and subcarinal lymphadenopathy, suspicious for nodal metastases. 3. Multidisciplinary thoracic oncology consultation and PET-CT suggested. 4. Liver surface appears finely irregular, cannot exclude cirrhosis. No liver masses. Suggest correlation with liver function tests. Consider outpatient hepatic elastography for further liver fibrosis risk stratification, as clinically warranted. 5. Three-vessel coronary atherosclerosis. 6. Aortic Atherosclerosis (ICD10-I70.0). Electronically Signed   By: Ilona Sorrel M.D.   On: 10/15/2020 15:30   NM PET Image Initial (PI) Skull Base To Thigh  Result Date: 11/07/2020 CLINICAL DATA:  Initial treatment strategy for small cell lung cancer. EXAM: NUCLEAR MEDICINE PET SKULL BASE TO THIGH TECHNIQUE: 9.57 mCi F-18 FDG was injected intravenously. Full-ring PET imaging was performed from the skull base to thigh after the radiotracer. CT data was obtained and used for attenuation correction and anatomic localization. Fasting blood glucose: 113 mg/dl COMPARISON:  CT scan 10/15/2020 FINDINGS: Mediastinal blood pool activity: SUV max 2.89 Liver  activity: SUV max NA NECK: No hypermetabolic lymph nodes in the neck. Incidental CT findings: Bilateral carotid artery calcifications are noted. CHEST: The left lower lobe pulmonary lesion is hypermetabolic with SUV max of 1.28. Adjacent left infrahilar lymph node is also hypermetabolic with SUV max of 7.86. Subcarinal lymph node is hypermetabolic with SUV max of 7.67. No other enlarged or hypermetabolic mediastinal hilar lymph nodes. No other hypermetabolic pulmonary lesions are identified. No worrisome pulmonary nodules on the CT scan to suggest pulmonary metastatic disease. No breast masses, supraclavicular or axillary adenopathy. Incidental CT findings: Stable emphysematous changes and areas of pulmonary scarring. Stable aortic and coronary artery calcifications. ABDOMEN/PELVIS: No findings for abdominal/pelvic metastatic disease. No hepatic adrenal gland lesions. No enlarged or hypermetabolic lymph nodes. Incidental CT findings: Moderate to advanced atherosclerotic calcifications involving the aorta and branch vessels but no aneurysm. SKELETON: No findings for osseous metastatic disease. Uptake in the upper chest and not muscles is likely physiologic. Incidental CT findings: none IMPRESSION: 1. Hypermetabolic left lower lobe lesion consistent with known pulmonary neoplasm. Metastatic left infrahilar and subcarinal adenopathy. 2. No findings for abdominal/pelvic metastatic disease or osseous metastatic disease. Electronically Signed   By: Marijo Sanes M.D.   On: 11/07/2020 10:18   DG CHEST PORT 1 VIEW  Result Date: 10/18/2020 CLINICAL DATA:  Status post bronchoscopy. EXAM: PORTABLE CHEST 1 VIEW COMPARISON:  September 30, 2020. FINDINGS: The heart size and mediastinal contours are within normal limits. Both lungs are clear. No pneumothorax or pleural effusion is noted. The visualized skeletal structures are unremarkable. IMPRESSION: No active disease. Aortic Atherosclerosis (ICD10-I70.0). Electronically  Signed   By: Marijo Conception M.D.   On: 10/18/2020 12:41   DG C-ARM BRONCHOSCOPY  Result Date: 10/18/2020 C-ARM BRONCHOSCOPY: Fluoroscopy was utilized by the requesting physician.  No radiographic interpretation.       IMPRESSION/PLAN: 1. 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 Dr. Lisbeth Renshaw discusses the pathology findings and reviews the nature of locally advanced lung cancer.  We discussed the risks, benefits, short, and long term effects of radiotherapy, as well as the curative intent, and the patient is interested in proceeding. Dr. Lisbeth Renshaw discusses the delivery and logistics of radiotherapy and anticipates a course of 6 1/2 weeks of radiotherapy to the LLL. Written consent is obtained and placed in  the chart, a copy was provided to the patient. She will be contacted to coordinate simulation so we can start chemoRT with her next cycle of chemotherapy that is scheduled on 11/21/20. 2. PCI. We briefly discussed the rationale for prophylactic cranial irradiation to reduce risks of brain disease. We discussed the rationale for this at the conclusion of systemic therapy. She is aware she would need another MRI prior to proceeding.    The above documentation reflects my direct findings during this shared patient visit. Please see the separate note by Dr. Lisbeth Renshaw on this date for the remainder of the patient's plan of care.    Carola Rhine, [facility]   **Disclaimer: This note was dictated with voice recognition software. Similar sounding words can inadvertently be transcribed and this note may contain transcription errors which may not have been corrected upon publication of note.**

## 2020-11-09 ENCOUNTER — Encounter: Payer: Self-pay | Admitting: General Practice

## 2020-11-09 NOTE — Progress Notes (Signed)
[facility] Psychosocial Distress Screening Clinical Social Work  Clinical Social Work was referred by distress screening protocol.  The patient scored a 6 on the Psychosocial Distress Thermometer which indicates moderate distress. Clinical Social Worker contacted patient by phone to assess for distress and other psychosocial needs.   She has had 3 treatments, has had some side effects like thrush.  She is anxious to complete treatment.  She "fights with both hands" for her husband, daughters and granddaughters.  Her faith is very important to her and is a great source of support.  CSW and patient discussed common feeling and emotions when being diagnosed with cancer, and the importance of support during treatment.  CSW informed patient of the support team and support services at [facility].  CSW provided contact information and encouraged patient to call with any questions or concerns.  ONCBCN DISTRESS SCREENING 11/08/2020  Screening Type Initial Screening  Distress experienced in past week (1-10) 6  Emotional problem type Adjusting to illness  Physical Problem type Nausea/vomiting;Mouth sores/swallowing  Other [phone]     Clinical Social Worker follow up needed: No.  If yes, follow up plan:  Beverely Pace, [place], LCSW Clinical Social Worker Phone:  [phone]

## 2020-11-11 ENCOUNTER — Emergency Department (HOSPITAL_BASED_OUTPATIENT_CLINIC_OR_DEPARTMENT_OTHER): Payer: Medicare HMO

## 2020-11-11 ENCOUNTER — Emergency Department (HOSPITAL_BASED_OUTPATIENT_CLINIC_OR_DEPARTMENT_OTHER)
Admission: EM | Admit: 2020-11-11 | Discharge: 2020-11-11 | Disposition: A | Discharge status: Home / Self Care | Payer: Medicare HMO | Attending: Emergency Medicine | Admitting: Emergency Medicine

## 2020-11-11 ENCOUNTER — Encounter (HOSPITAL_BASED_OUTPATIENT_CLINIC_OR_DEPARTMENT_OTHER): Payer: Self-pay

## 2020-11-11 ENCOUNTER — Other Ambulatory Visit: Payer: Self-pay | Admitting: Physician Assistant

## 2020-11-11 ENCOUNTER — Telehealth: Payer: Self-pay | Admitting: Cardiology

## 2020-11-11 ENCOUNTER — Other Ambulatory Visit: Payer: Self-pay

## 2020-11-11 ENCOUNTER — Ambulatory Visit: Payer: Medicare HMO | Admitting: Radiation Oncology

## 2020-11-11 ENCOUNTER — Telehealth: Payer: Self-pay | Admitting: Medical Oncology

## 2020-11-11 ENCOUNTER — Telehealth: Payer: Self-pay | Admitting: *Deleted

## 2020-11-11 DIAGNOSIS — Z7901 Long term (current) use of anticoagulants: Secondary | ICD-10-CM | POA: Diagnosis not present

## 2020-11-11 DIAGNOSIS — J45909 Unspecified asthma, uncomplicated: Secondary | ICD-10-CM | POA: Diagnosis not present

## 2020-11-11 DIAGNOSIS — Z87891 Personal history of nicotine dependence: Secondary | ICD-10-CM | POA: Insufficient documentation

## 2020-11-11 DIAGNOSIS — I4891 Unspecified atrial fibrillation: Secondary | ICD-10-CM

## 2020-11-11 DIAGNOSIS — Z7951 Long term (current) use of inhaled steroids: Secondary | ICD-10-CM | POA: Diagnosis not present

## 2020-11-11 DIAGNOSIS — Z8673 Personal history of transient ischemic attack (TIA), and cerebral infarction without residual deficits: Secondary | ICD-10-CM | POA: Insufficient documentation

## 2020-11-11 DIAGNOSIS — Z85118 Personal history of other malignant neoplasm of bronchus and lung: Secondary | ICD-10-CM | POA: Insufficient documentation

## 2020-11-11 DIAGNOSIS — E876 Hypokalemia: Secondary | ICD-10-CM

## 2020-11-11 DIAGNOSIS — J9811 Atelectasis: Secondary | ICD-10-CM | POA: Diagnosis not present

## 2020-11-11 DIAGNOSIS — I10 Essential (primary) hypertension: Secondary | ICD-10-CM | POA: Diagnosis not present

## 2020-11-11 DIAGNOSIS — Z79899 Other long term (current) drug therapy: Secondary | ICD-10-CM | POA: Diagnosis not present

## 2020-11-11 DIAGNOSIS — R002 Palpitations: Secondary | ICD-10-CM | POA: Diagnosis present

## 2020-11-11 DIAGNOSIS — I700 Atherosclerosis of aorta: Secondary | ICD-10-CM | POA: Diagnosis not present

## 2020-11-11 DIAGNOSIS — D709 Neutropenia, unspecified: Secondary | ICD-10-CM

## 2020-11-11 LAB — DIFFERENTIAL
Abs Immature Granulocytes: 0 10*3/uL (ref 0.00–0.07)
Basophils Absolute: 0 10*3/uL (ref 0.0–0.1)
Basophils Relative: 2 %
Eosinophils Absolute: 0 10*3/uL (ref 0.0–0.5)
Eosinophils Relative: 2 %
Lymphocytes Relative: 52 %
Lymphs Abs: 0.5 10*3/uL — ABNORMAL LOW (ref 0.7–4.0)
Monocytes Absolute: 0 10*3/uL — ABNORMAL LOW (ref 0.1–1.0)
Monocytes Relative: 2 %
Neutro Abs: 0.4 10*3/uL — CL (ref 1.7–7.7)
Neutrophils Relative %: 42 %

## 2020-11-11 LAB — CBC
HCT: 36.4 % (ref 36.0–46.0)
Hemoglobin: 12.7 g/dL (ref 12.0–15.0)
MCH: 34.3 pg — ABNORMAL HIGH (ref 26.0–34.0)
MCHC: 34.9 g/dL (ref 30.0–36.0)
MCV: 98.4 fL (ref 80.0–100.0)
Platelets: 110 10*3/uL — ABNORMAL LOW (ref 150–400)
RBC: 3.7 MIL/uL — ABNORMAL LOW (ref 3.87–5.11)
RDW: 13.4 % (ref 11.5–15.5)
WBC: 1 10*3/uL — CL (ref 4.0–10.5)
nRBC: 0 % (ref 0.0–0.2)

## 2020-11-11 LAB — BASIC METABOLIC PANEL
Anion gap: 8 (ref 5–15)
BUN: 14 mg/dL (ref 8–23)
CO2: 33 mmol/L — ABNORMAL HIGH (ref 22–32)
Calcium: 8.4 mg/dL — ABNORMAL LOW (ref 8.9–10.3)
Chloride: 100 mmol/L (ref 98–111)
Creatinine, Ser: 0.86 mg/dL (ref 0.44–1.00)
GFR, Estimated: >60 mL/min (ref >60)
Glucose, Bld: 108 mg/dL — ABNORMAL HIGH (ref 70–99)
Potassium: 3.4 mmol/L — ABNORMAL LOW (ref 3.5–5.1)
Sodium: 141 mmol/L (ref 135–145)

## 2020-11-11 LAB — MAGNESIUM: Magnesium: 1.2 mg/dL — ABNORMAL LOW (ref 1.7–2.4)

## 2020-11-11 MED ORDER — METOPROLOL TARTRATE 5 MG/5ML IV SOLN
5.0000 mg | Freq: Once | INTRAVENOUS | Status: AC
  Administered 2020-11-11: 5 mg via INTRAVENOUS
  Filled 2020-11-11: qty 5

## 2020-11-11 MED ORDER — POTASSIUM CHLORIDE 10 MEQ/100ML IV SOLN
10.0000 meq | INTRAVENOUS | Status: AC
Start: 2020-11-11 — End: 2020-11-11
  Administered 2020-11-11 (×2): 10 meq via INTRAVENOUS
  Filled 2020-11-11 (×2): qty 100

## 2020-11-11 MED ORDER — LACTATED RINGERS IV BOLUS
500.0000 mL | Freq: Once | INTRAVENOUS | Status: AC
  Administered 2020-11-11: 500 mL via INTRAVENOUS

## 2020-11-11 MED ORDER — METOPROLOL TARTRATE 25 MG PO TABS
25.0000 mg | ORAL_TABLET | Freq: Once | ORAL | Status: DC
  Filled 2020-11-11: qty 1

## 2020-11-11 MED ORDER — POTASSIUM CHLORIDE CRYS ER 20 MEQ PO TBCR
40.0000 meq | EXTENDED_RELEASE_TABLET | Freq: Once | ORAL | Status: AC
  Administered 2020-11-11: 40 meq via ORAL
  Filled 2020-11-11: qty 2

## 2020-11-11 MED ORDER — MAGNESIUM SULFATE 4 GM/100ML IV SOLN
4.0000 g | Freq: Once | INTRAVENOUS | Status: AC
  Administered 2020-11-11: 4 g via INTRAVENOUS
  Filled 2020-11-11: qty 100

## 2020-11-11 NOTE — Telephone Encounter (Signed)
Patient c/o Palpitations:  High priority if patient c/o lightheadedness, shortness of breath, or chest pain  How long have you had palpitations/irregular HR/ Afib? Are you having the symptoms now? Since yesterday.. no current symptoms   Are you currently experiencing lightheadedness, SOB or CP? No   Do you have a history of afib (atrial fibrillation) or irregular heart rhythm? Yes   Have you checked your BP or HR? (document readings if available): hr 163 bp 157/97, hr 137 bp 145/92, hr 163 bp 157/97 , hr 101 bp 132/74, hr 147 bp 146/104, hr 82 129/93  Are you experiencing any other symptoms? no

## 2020-11-11 NOTE — ED Provider Notes (Signed)
Patient given additional metoprolol with subsequent conversion back to sinus rhythm.  Vital signs now remained stable.  Patient advised outpatient follow-up with oncology and cardiology within the week.  Advising immediate return for recurrent tachycardia fevers or any additional concerns.    Luna Fuse, MD 11/11/20 [phone]

## 2020-11-11 NOTE — ED Triage Notes (Signed)
Onset this am heart rate in 215.  Called cardiologist and told to come into ER..  Associated with SOB.  Denies SOB or chest pain at present.  Hx of AF

## 2020-11-11 NOTE — Discharge Instructions (Signed)
Your white blood cell count was very low.  He will need to follow-up with your oncologist regarding this finding, call them on Monday to make an appointment.  Return to the ER immediately if you have recurrent tachycardia, chest pain or fevers.

## 2020-11-11 NOTE — ED Provider Notes (Signed)
[facility] EMERGENCY DEPT Provider Note   CSN: [id number] Arrival date & time: 11/11/20  1139     History Chief Complaint  Patient presents with   Tachycardia    Patricia [name] is a 70 y.o. female.  HPI Patient presents with palpitations.  Felt her heart racing earlier today.  History of SVT.  States heart rate up to 200.  Told to come in to the ER.  Has a history of lung cancer that is nonoperable.  Has lung cancer and had chemotherapy Monday last week. Does have history of A. fib and SVT.  Is on both metoprolol and Eliquis.  Has been taking both.  Was seen at oncology office and found to be low in potassium and magnesium.  Has been on oral supplementation.  Shortness of breath when her heart is going fast.  No nausea or vomiting.  No diarrhea.    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   Lung nodule 10/18/2020   Anxiety    AF (paroxysmal atrial fibrillation) (HCC)    Paroxysmal SVT (supraventricular tachycardia) (HCC)    Chronic pain    Hyponatremia 10/01/2020   Essential hypertension 10/01/2020   Syncope 09/30/2020   Asthmatic bronchitis 08/11/2020   Colles' fracture of right radius, initial encounter for closed fracture 11/19/2019    Past Surgical History:  Procedure Laterality Date   BACK SURGERY     BREAST EXCISIONAL BIOPSY Left 1997   benign   BRONCHIAL BIOPSY  10/18/2020   Procedure: BRONCHIAL BIOPSIES;  Surgeon: [name], DO;  Location: [facility];  Service: Pulmonary;;   BRONCHIAL BRUSHINGS  10/18/2020   Procedure:  BRONCHIAL BRUSHINGS;  Surgeon: [name], DO;  Location: [facility];  Service: Pulmonary;;   BRONCHIAL NEEDLE ASPIRATION BIOPSY  10/18/2020   Procedure: BRONCHIAL NEEDLE ASPIRATION BIOPSIES;  Surgeon: [name], DO;  Location: [facility];  Service: Pulmonary;;   BRONCHIAL WASHINGS  10/18/2020   Procedure: BRONCHIAL WASHINGS;  Surgeon: [name], DO;  Location: [facility];  Service: Pulmonary;;   hemorrhoidecotmy     HERNIA MESH REMOVAL     OPEN REDUCTION INTERNAL FIXATION (ORIF) DISTAL RADIAL FRACTURE Right 11/19/2019   Procedure: OPEN REDUCTION INTERNAL FIXATION (ORIF) DISTAL RADIUS AND ULNA FRACTURE WITH REPAIR AS NECESSARY;  Surgeon: Roseanne Kaufman, MD;  Location: [facility];  Service: Orthopedics;  Laterality: Right;  2 hrs Block with IV sedation   ORIF RADIUS & ULNA FRACTURES     TONSILLECTOMY     VIDEO BRONCHOSCOPY WITH ENDOBRONCHIAL NAVIGATION Left 10/18/2020   Procedure: VIDEO BRONCHOSCOPY WITH ENDOBRONCHIAL NAVIGATION;  Surgeon: [name], DO;  Location: [facility];  Service: Pulmonary;  Laterality: Left;  ION   VIDEO BRONCHOSCOPY WITH ENDOBRONCHIAL ULTRASOUND Bilateral 10/18/2020   Procedure: VIDEO BRONCHOSCOPY WITH ENDOBRONCHIAL ULTRASOUND;  Surgeon: [name], DO;  Location: [facility];  Service: Pulmonary;  Laterality: Bilateral;   VIDEO BRONCHOSCOPY WITH RADIAL ENDOBRONCHIAL ULTRASOUND  10/18/2020   Procedure: VIDEO  BRONCHOSCOPY WITH RADIAL ENDOBRONCHIAL ULTRASOUND;  Surgeon: [name], DO;  Location: [facility];  Service: Pulmonary;;     OB History   No obstetric history on file.     Family History  Problem Relation Age of Onset   Breast cancer Sister    Breast cancer Maternal Aunt    Breast cancer Paternal Aunt     Social History   Tobacco Use   Smoking status: Former    Packs/day: 1.00    Years: 50.00    Pack years: 50.00    Types: Cigarettes    Start date: 7    Quit date: 12/14/2019    Years since  quitting: 0.9   Smokeless tobacco: Never  Vaping Use   Vaping Use: Never used  Substance Use Topics   Alcohol use: Not Currently    Comment: occ once every 2 months   Drug use: Never    Home Medications Prior to Admission medications   Medication Sig Start Date End Date Taking? Authorizing Provider  acetaminophen (TYLENOL) 325 MG tablet 1 tablet as needed.    [provider]  budesonide-formoterol (SYMBICORT) 80-4.5 MCG/ACT inhaler Take 2 puffs first thing in am and then another 2 puffs about 12 hours later. 08/11/20   Tanda Rockers, MD  carboxymethylcellulose (REFRESH PLUS) 0.5 % SOLN Place 1 drop into both eyes 3 (three) times daily as needed (for dryness).     [provider]  carisoprodol (SOMA) 350 MG tablet Take 350 mg by mouth 3 (three) times daily.  09/26/17   [provider]  Cholecalciferol (VITAMIN D3) 50 MCG (2000 UT) TABS Take 2,000 Units by mouth daily.    [provider]  clobetasol ointment (TEMOVATE) 4.27 % Apply 1 application topically See admin instructions. Apply to vaginal area daily as directed    [provider]  clonazePAM (KLONOPIN) 1 MG tablet Take 1 mg by mouth at bedtime.  10/15/17   [provider]  ELIQUIS 5 MG TABS tablet TAKE 1 TABLET(5 MG) BY MOUTH TWICE DAILY 10/03/20   Camnitz, Ocie Doyne, MD  fluticasone [facility]) 50 MCG/ACT nasal spray Place 2 sprays into both nostrils daily as needed for allergies or rhinitis.  10/11/17   [provider]  furosemide (LASIX) 20 MG tablet Take 1 tablet (20 mg total) by mouth daily. 10/20/20 01/18/21  Terrilee Croak, MD  gabapentin (NEURONTIN) 600 MG tablet Take 600 mg by mouth See admin instructions. Take 600 mg by mouth in the morning and at lunchtime 09/16/17   [provider]  hyoscyamine (LEVSIN) 0.125 MG tablet Take 0.125 mg by mouth as needed. 02/06/10   [provider]  lidocaine (XYLOCAINE) 2 % solution Use as directed 15 mLs in the mouth or  throat every 6 (six) hours as needed for mouth pain. 11/04/20   Walisiewicz, Harley Hallmark, PA-C  linaclotide (LINZESS) 145 MCG CAPS capsule Take 1 capsule (145 mcg total) by mouth daily at 4 PM. 10/03/20   Debbe Odea, MD  magic mouthwash SOLN Take 5 mLs by mouth 4 (four) times daily as needed for mouth pain. Pt is allergic to Magnesium 11/04/20   Walisiewicz, Kaitlyn E, PA-C  magnesium oxide (MAG-OX) 400 (240 Mg) MG tablet Take 1 tablet (400 mg total) by mouth daily. 11/08/20   Heilingoetter, Cassandra L, PA-C  metoprolol tartrate (LOPRESSOR) 50 MG tablet Take 1 tablet (50 mg total) by mouth 2 (two) times daily. 10/02/20   Debbe Odea, MD  omeprazole (PRILOSEC) 20 MG  capsule Take 20 mg by mouth daily before breakfast.  08/27/17   [provider]  ondansetron (ZOFRAN) 4 MG tablet Take 1 tablet (4 mg total) by mouth every 8 (eight) hours as needed for nausea or vomiting. 11/17/19 11/16/20  Roseanne Kaufman, MD  ondansetron (ZOFRAN-ODT) 8 MG disintegrating tablet Take 8 mg by mouth 3 (three) times daily. 10/13/20   [provider]  polyethylene glycol (MIRALAX / GLYCOLAX) packet Take 34 g by mouth in the morning.     [provider]  potassium chloride SA (KLOR-CON) 20 MEQ tablet Take 1 tablet (20 mEq total) by mouth daily. 11/08/20   Heilingoetter, Cassandra L, PA-C  prochlorperazine (COMPAZINE) 10 MG tablet Take 1 tablet (10 mg total) by mouth every 6 (six) hours as needed for nausea or vomiting. 10/27/20   Curt Bears, MD  rosuvastatin (CRESTOR) 5 MG tablet Take 5 mg by mouth 3 (three) times a week. Mondays Wednesdays Fridays    [provider]  sodium chloride 1 g tablet Take 2 tablets (2 g total) by mouth 3 (three) times daily with meals. Patient taking differently: Take 1 g by mouth 3 (three) times daily with meals. 10/19/20 11/18/20  Terrilee Croak, MD  traMADol (ULTRAM) 50 MG tablet Take 100 mg by mouth as needed. 10/08/19   [provider]  VENTOLIN HFA  108 (90 Base) MCG/ACT inhaler Inhale 2 puffs into the lungs every 6 (six) hours as needed for wheezing or shortness of breath.  09/30/17   [provider]  zaleplon (SONATA) 10 MG capsule Take 10 mg by mouth at bedtime as needed (for interrupted sleep).     [provider]    Allergies    Augmentin [amoxicillin-pot clavulanate], Levaquin [levofloxacin in d5w], Mom [magnesium hydroxide], and Oxycodone  Review of Systems   Review of Systems  Constitutional:  Positive for appetite change.  HENT:  Negative for congestion.   Respiratory:  Positive for shortness of breath.   Cardiovascular:  Positive for palpitations.  Gastrointestinal:  Negative for abdominal pain.  Genitourinary:  Negative for flank pain.  Musculoskeletal:  Negative for back pain.  Skin:  Negative for rash.  Neurological:  Negative for weakness.  Psychiatric/Behavioral:  Negative for confusion.    Physical Exam Updated Vital Signs BP (!) 150/82   Pulse 78   Temp 98.4 F (36.9 C)   Resp (!) 22   Ht 5\' 6"  (1.676 m)   Wt 85.3 kg   LMP  (LMP Unknown)   SpO2 100%   BMI 30.34 kg/m   Physical Exam Vitals and nursing note reviewed.  HENT:     Head: Atraumatic.  Eyes:     Extraocular Movements: Extraocular movements intact.  Cardiovascular:     Rate and Rhythm: Regular rhythm.  Pulmonary:     Breath sounds: No wheezing or rhonchi.  Abdominal:     Tenderness: There is no abdominal tenderness.  Musculoskeletal:        General: No tenderness.     Cervical back: Neck supple.  Skin:    General: Skin is warm.     Capillary Refill: Capillary refill takes less than 2 seconds.  Neurological:     Mental Status: She is alert and oriented to person, place, and time.    ED Results / Procedures / Treatments   Labs (all labs ordered are listed, but only abnormal results are displayed) Labs Reviewed  BASIC METABOLIC PANEL - Abnormal; Notable for the following components:  Result Value   Potassium  3.4 (*)    CO2 33 (*)    Glucose, Bld 108 (*)    Calcium 8.4 (*)    All other components within normal limits  MAGNESIUM - Abnormal; Notable for the following components:   Magnesium 1.2 (*)    All other components within normal limits  CBC - Abnormal; Notable for the following components:   WBC 1.0 (*)    RBC 3.70 (*)    MCH 34.3 (*)    Platelets 110 (*)    All other components within normal limits  DIFFERENTIAL - Abnormal; Notable for the following components:   Neutro Abs 0.4 (*)    Lymphs Abs 0.5 (*)    Monocytes Absolute 0.0 (*)    All other components within normal limits    EKG EKG Interpretation  Date/Time:  Friday November 11 2020 11:46:22 EDT Ventricular Rate:  104 PR Interval:  164 QRS Duration: 68 QT Interval:  340 QTC Calculation: 447 R Axis:   65 Text Interpretation: Sinus tachycardia Otherwise normal ECG Confirmed by Davonna Belling [phone]) on 11/11/2020 2:56:31 PM  Radiology DG Chest Port 1 View  Result Date: 11/11/2020 CLINICAL DATA:  70 year old female with atrial fibrillation. EXAM: PORTABLE CHEST - 1 VIEW COMPARISON:  10/18/2020 FINDINGS: The mediastinal contours are within normal limits. No cardiomegaly. Atherosclerotic calcification of the aortic arch. Mild bibasilar subsegmental atelectasis. No focal consolidation, pneumothorax, or pleural effusion. No acute osseous abnormality. IMPRESSION: 1. Mild bibasilar subsegmental atelectasis. 2.  Aortic Atherosclerosis (ICD10-I70.0). Electronically Signed   By: Ruthann Cancer M.D.   On: 11/11/2020 12:50    Procedures Procedures   Medications Ordered in ED Medications  magnesium sulfate IVPB 4 g 100 mL (has no administration in time range)  potassium chloride 10 mEq in 100 mL IVPB (10 mEq Intravenous New Bag/Given 11/11/20 1400)  metoprolol tartrate (LOPRESSOR) injection 5 mg (has no administration in time range)  lactated ringers bolus 500 mL (has no administration in time range)  potassium chloride SA  (KLOR-CON) CR tablet 40 mEq (40 mEq Oral Given 11/11/20 1352)    ED Course  I have reviewed the triage vital signs and the nursing notes.  Pertinent labs & imaging results that were available during my care of the patient were reviewed by me and considered in my medical decision making (see chart for details).    MDM Rules/Calculators/A&P                           Patient presented with tachycardia at home.  History of SVT and A. fib.  States she felt as if she was in SVT.  States it is stopped however before arrival.  Sinus rhythm upon arrival but found to be hypokalemic and hypomagnesemic.  Also has a neutropenia but is on chemotherapy.  Not febrile.  Discussed with pharmacy and IV supplementation of potassium magnesium started.  However while in the ER developed tachycardia.  It appeared to be in atrial fibrillation.  Is on anticoagulation.  We will give 5 of metoprolol.  Hopefully will convert back or leads to rate control.  Patient would rather not be admitted.  Care will be turned over to Dr. Almyra Free.  CRITICAL CARE Performed by: Davonna Belling Total critical care time: 30 minutes Critical care time was exclusive of separately billable procedures and treating other patients. Critical care was necessary to treat or prevent imminent or life-threatening deterioration. Critical care was time spent personally  by me on the following activities: development of treatment plan with patient and/or surrogate as well as nursing, discussions with consultants, evaluation of patient's response to treatment, examination of patient, obtaining history from patient or surrogate, ordering and performing treatments and interventions, ordering and review of laboratory studies, ordering and review of radiographic studies, pulse oximetry and re-evaluation of patient's condition.  Final Clinical Impression(s) / ED Diagnoses Final diagnoses:  Hypokalemia  Hypomagnesemia  Atrial fibrillation, unspecified type  ([place])  Neutropenia, unspecified type [facility])    Rx / [place] Orders ED Discharge Orders     None        Davonna Belling, MD 11/11/20 1517

## 2020-11-11 NOTE — Telephone Encounter (Signed)
Pt called top rep[ort that she has been in SVT on and off since yesterday... she did not feel well this morning and her monitor for her HR was reading 214... now after valsalva it is reading 163... she denies dizziness, SOB but says if she is up and moving around she does not feel well.    Her BP now is 133/83... I have advised her that she may need to have someone take her to the ED or call EMS for IV therapy to help break her of the SVT and be monitored... she says she had low K and Mag at her Oncologist this past Monday 11/07/20 and she is on supplements.   She says she may go to the ED but prefers to call her Oncologist first... I will also forward her note to Dr. Curt Bears for review.

## 2020-11-11 NOTE — Telephone Encounter (Signed)
Pt left message stating, "my heart rate is all over the place, It was 215 then went down." Message was passed to Dr.Mohamed desk nurse for further evaluation.

## 2020-11-14 ENCOUNTER — Other Ambulatory Visit: Payer: Self-pay

## 2020-11-14 ENCOUNTER — Other Ambulatory Visit: Payer: Self-pay | Admitting: Internal Medicine

## 2020-11-14 ENCOUNTER — Telehealth: Payer: Self-pay | Admitting: Medical Oncology

## 2020-11-14 ENCOUNTER — Inpatient Hospital Stay: Payer: Medicare HMO

## 2020-11-14 DIAGNOSIS — C3432 Malignant neoplasm of lower lobe, left bronchus or lung: Secondary | ICD-10-CM | POA: Diagnosis not present

## 2020-11-14 DIAGNOSIS — E876 Hypokalemia: Secondary | ICD-10-CM | POA: Diagnosis not present

## 2020-11-14 DIAGNOSIS — Z5111 Encounter for antineoplastic chemotherapy: Secondary | ICD-10-CM | POA: Diagnosis not present

## 2020-11-14 DIAGNOSIS — Z5189 Encounter for other specified aftercare: Secondary | ICD-10-CM | POA: Diagnosis not present

## 2020-11-14 DIAGNOSIS — D701 Agranulocytosis secondary to cancer chemotherapy: Secondary | ICD-10-CM | POA: Diagnosis not present

## 2020-11-14 DIAGNOSIS — T451X5A Adverse effect of antineoplastic and immunosuppressive drugs, initial encounter: Secondary | ICD-10-CM | POA: Diagnosis not present

## 2020-11-14 DIAGNOSIS — C771 Secondary and unspecified malignant neoplasm of intrathoracic lymph nodes: Secondary | ICD-10-CM | POA: Diagnosis not present

## 2020-11-14 DIAGNOSIS — Z510 Encounter for antineoplastic radiation therapy: Secondary | ICD-10-CM | POA: Diagnosis not present

## 2020-11-14 LAB — CMP (CANCER CENTER ONLY)
ALT: 27 U/L (ref 0–44)
AST: 21 U/L (ref 15–41)
Albumin: 3.8 g/dL (ref 3.5–5.0)
Alkaline Phosphatase: 109 U/L (ref 38–126)
Anion gap: 7 (ref 5–15)
BUN: 16 mg/dL (ref 8–23)
CO2: 31 mmol/L (ref 22–32)
Calcium: 8.8 mg/dL — ABNORMAL LOW (ref 8.9–10.3)
Chloride: 101 mmol/L (ref 98–111)
Creatinine: 0.9 mg/dL (ref 0.44–1.00)
GFR, Estimated: >60 mL/min (ref >60)
Glucose, Bld: 83 mg/dL (ref 70–99)
Potassium: 4 mmol/L (ref 3.5–5.1)
Sodium: 139 mmol/L (ref 135–145)
Total Bilirubin: 0.2 mg/dL — ABNORMAL LOW (ref 0.3–1.2)
Total Protein: 6.9 g/dL (ref 6.5–8.1)

## 2020-11-14 LAB — CBC WITH DIFFERENTIAL (CANCER CENTER ONLY)
Abs Immature Granulocytes: 0 10*3/uL (ref 0.00–0.07)
Basophils Absolute: 0 10*3/uL (ref 0.0–0.1)
Basophils Relative: 1 %
Eosinophils Absolute: 0.1 10*3/uL (ref 0.0–0.5)
Eosinophils Relative: 8 %
HCT: 35.5 % — ABNORMAL LOW (ref 36.0–46.0)
Hemoglobin: 12.2 g/dL (ref 12.0–15.0)
Immature Granulocytes: 0 %
Lymphocytes Relative: 72 %
Lymphs Abs: 0.8 10*3/uL (ref 0.7–4.0)
MCH: 34.4 pg — ABNORMAL HIGH (ref 26.0–34.0)
MCHC: 34.4 g/dL (ref 30.0–36.0)
MCV: 100 fL (ref 80.0–100.0)
Monocytes Absolute: 0.1 10*3/uL (ref 0.1–1.0)
Monocytes Relative: 12 %
Neutro Abs: 0.1 10*3/uL — CL (ref 1.7–7.7)
Neutrophils Relative %: 7 %
Platelet Count: 111 10*3/uL — ABNORMAL LOW (ref 150–400)
RBC: 3.55 MIL/uL — ABNORMAL LOW (ref 3.87–5.11)
RDW: 13 % (ref 11.5–15.5)
WBC Count: 1 10*3/uL — ABNORMAL LOW (ref 4.0–10.5)
nRBC: 0 % (ref 0.0–0.2)

## 2020-11-14 LAB — MAGNESIUM: Magnesium: 1.8 mg/dL (ref 1.7–2.4)

## 2020-11-14 NOTE — Telephone Encounter (Signed)
Hypokalemia/hypomagnesemia-In ED over the weekend and received iv potassium and Magnesium. Does she need to keep lab appt today?. I told her yes . No diff was done.

## 2020-11-15 ENCOUNTER — Ambulatory Visit
Admission: RE | Admit: 2020-11-15 | Discharge: 2020-11-15 | Disposition: A | Discharge status: Home / Self Care | Payer: Medicare HMO | Source: Ambulatory Visit | Attending: Radiation Oncology | Admitting: Radiation Oncology

## 2020-11-15 DIAGNOSIS — T451X5A Adverse effect of antineoplastic and immunosuppressive drugs, initial encounter: Secondary | ICD-10-CM | POA: Diagnosis not present

## 2020-11-15 DIAGNOSIS — C3432 Malignant neoplasm of lower lobe, left bronchus or lung: Secondary | ICD-10-CM | POA: Diagnosis not present

## 2020-11-15 DIAGNOSIS — E876 Hypokalemia: Secondary | ICD-10-CM | POA: Insufficient documentation

## 2020-11-15 DIAGNOSIS — Z5189 Encounter for other specified aftercare: Secondary | ICD-10-CM | POA: Insufficient documentation

## 2020-11-15 DIAGNOSIS — Z510 Encounter for antineoplastic radiation therapy: Secondary | ICD-10-CM | POA: Diagnosis not present

## 2020-11-15 DIAGNOSIS — C771 Secondary and unspecified malignant neoplasm of intrathoracic lymph nodes: Secondary | ICD-10-CM | POA: Insufficient documentation

## 2020-11-15 DIAGNOSIS — Z5111 Encounter for antineoplastic chemotherapy: Secondary | ICD-10-CM | POA: Diagnosis not present

## 2020-11-15 DIAGNOSIS — D701 Agranulocytosis secondary to cancer chemotherapy: Secondary | ICD-10-CM | POA: Insufficient documentation

## 2020-11-15 NOTE — Progress Notes (Signed)
[facility] OFFICE PROGRESS NOTE  [name], MD [place] [address]  DIAGNOSIS:  Limited stage (T2 a, N 2, M0) small cell lung cancer presented with left lower lobe lung mass in addition to left infrahilar and subcarinal lymphadenopathy diagnosed in October 2022.  PRIOR THERAPY: None  CURRENT THERAPY: She is currently undergoing concurrent chemoradiation with cisplatin 80 mg per metered squared on days 1 and etoposide 100 mg per metered squared on days 1, 2, and 3 IV every 3 weeks. First dose 10/31/20.   INTERVAL HISTORY: Patricia [name] 70 y.o. female returns to the clinic today for a follow-up visit.  The patient was recently diagnosed with limited stage lung cancer.  She underwent her first cycle of treatment 3 weeks ago and she tolerated it fairly well except she developed neutropenia in the interval which required short acting G-CSF injections x3. She had bone pain in her low back following this and took tylenol, advil, muscle relaxer, heating pads, and Claritin. She also had hypomagnesemia for which she was given a prescription of magnesium oxide 400 mg p.o. daily.  The patient developed SVT 1.5 weeks after undergoing after her treatment and presented to the emergency room.  She converted back to sinus rhythm.  She was given IV magnesium and potassium.  She follows closely with cardiology and has an upcoming appointment with them.  The patient denies any fever, worsening shortness of breath, chills, or malaise.  She had a mild sore throat this AM and drainage which resolved with drinking her morning coffee. Denies any skin infections, abdominal pain, or dysuria.  She developed some thrush following treatment which was treated. She was also encouraged to use baking soda and avoid mucositis.  She also has a prescription for Magic mouthwash.  After her first treatment, she noticed bilateral hearing deficits. She reports she had mild baseline  hearing loss bilaterally prior to treatment but she would consider this significant. Denies ear pain or drainage. She denies any unexplained weight loss or night sweats.  She actually gained weight. She denies swelling. She cut back on her salt tablet to 2 per day to 1 per day. Reports her baseline cough which produces sticky white phlegm. She saw Dr. Valeta Harms last week who prescribed a new inhaler and she states this has helped her get her phlegm up. She reports mild baseline dyspnea on exertion.  She denies any chest pain or hemoptysis.  She had mild nausea last night which resolved with her anti-emetic. She denies any vomiting, diarrhea, or recent constipation.  Denies any headache or visual changes.  Chronic constipation for which she takes Linzess.  The patient is here today for evaluation and repeat blood work before starting cycle #2.    MEDICAL HISTORY: Past Medical History:  Diagnosis Date   Anxiety    GERD (gastroesophageal reflux disease)    Hyperlipidemia    Hypertension    IBS (irritable bowel syndrome)    Insomnia    Low back pain    Lung cancer ([place]) 10/2020   TIA (transient ischemic attack) 10/2017   no deficits   Vitamin B12 deficiency    Vitamin D deficiency     ALLERGIES:  is allergic to augmentin [amoxicillin-pot clavulanate], levaquin [levofloxacin in d5w], mom [magnesium hydroxide], and oxycodone.  MEDICATIONS:  Current Outpatient Medications  Medication Sig Dispense Refill   acetaminophen (TYLENOL) 325 MG tablet 1 tablet as needed.     budesonide-formoterol (SYMBICORT) 80-4.5 MCG/ACT inhaler  Take 2 puffs first thing in am and then another 2 puffs about 12 hours later. 1 each 12   carboxymethylcellulose (REFRESH PLUS) 0.5 % SOLN Place 1 drop into both eyes 3 (three) times daily as needed (for dryness).      carisoprodol (SOMA) 350 MG tablet Take 350 mg by mouth 3 (three) times daily.   5   Cholecalciferol (VITAMIN D3) 50 MCG (2000 UT) TABS Take 2,000 Units by mouth  daily.     clobetasol ointment (TEMOVATE) 2.33 % Apply 1 application topically See admin instructions. Apply to vaginal area daily as directed     clonazePAM (KLONOPIN) 1 MG tablet Take 1 mg by mouth at bedtime.   3   ELIQUIS 5 MG TABS tablet TAKE 1 TABLET(5 MG) BY MOUTH TWICE DAILY 60 tablet 6   fluticasone (FLONASE) 50 MCG/ACT nasal spray Place 2 sprays into both nostrils daily as needed for allergies or rhinitis.   5   furosemide (LASIX) 20 MG tablet Take 1 tablet (20 mg total) by mouth daily. 30 tablet 2   gabapentin (NEURONTIN) 600 MG tablet Take 600 mg by mouth See admin instructions. Take 600 mg by mouth in the morning and at lunchtime     hyoscyamine (LEVSIN) 0.125 MG tablet Take 0.125 mg by mouth as needed.     lidocaine (XYLOCAINE) 2 % solution Use as directed 15 mLs in the mouth or throat every 6 (six) hours as needed for mouth pain. 100 mL 0   linaclotide (LINZESS) 145 MCG CAPS capsule Take 1 capsule (145 mcg total) by mouth daily at 4 PM. 30 capsule 0   magic mouthwash SOLN Take 5 mLs by mouth 4 (four) times daily as needed for mouth pain. Pt is allergic to Magnesium 240 mL 1   magnesium oxide (MAG-OX) 400 (240 Mg) MG tablet Take 1 tablet (400 mg total) by mouth daily. 30 tablet 1   metoprolol tartrate (LOPRESSOR) 50 MG tablet Take 1 tablet (50 mg total) by mouth 2 (two) times daily. 60 tablet 0   omeprazole (PRILOSEC) 20 MG capsule Take 20 mg by mouth daily before breakfast.      ondansetron (ZOFRAN-ODT) 8 MG disintegrating tablet Take 8 mg by mouth 3 (three) times daily.     polyethylene glycol (MIRALAX / GLYCOLAX) packet Take 34 g by mouth in the morning.      potassium chloride SA (KLOR-CON) 20 MEQ tablet Take 1 tablet (20 mEq total) by mouth daily. (Patient not taking: Reported on 11/16/2020) 5 tablet 0   prochlorperazine (COMPAZINE) 10 MG tablet Take 1 tablet (10 mg total) by mouth every 6 (six) hours as needed for nausea or vomiting. 30 tablet 0   rosuvastatin (CRESTOR) 5 MG  tablet Take 5 mg by mouth 3 (three) times a week. Mondays Wednesdays Fridays     Tiotropium Bromide-Olodaterol (STIOLTO RESPIMAT) 2.5-2.5 MCG/ACT AERS Inhale 2 puffs into the lungs daily. 4 g 0   Tiotropium Bromide-Olodaterol 2.5-2.5 MCG/ACT AERS Inhale 2 puffs into the lungs daily. 4 g 5   traMADol (ULTRAM) 50 MG tablet Take 100 mg by mouth as needed.     VENTOLIN HFA 108 (90 Base) MCG/ACT inhaler Inhale 2 puffs into the lungs every 6 (six) hours as needed for wheezing or shortness of breath.   5   zaleplon (SONATA) 10 MG capsule Take 10 mg by mouth at bedtime as needed (for interrupted sleep).      No current facility-administered medications for this visit.  SURGICAL HISTORY:  Past Surgical History:  Procedure Laterality Date   BACK SURGERY     BREAST EXCISIONAL BIOPSY Left 1997   benign   BRONCHIAL BIOPSY  10/18/2020   Procedure: BRONCHIAL BIOPSIES;  Surgeon: [name], DO;  Location: [facility];  Service: Pulmonary;;   BRONCHIAL BRUSHINGS  10/18/2020   Procedure: BRONCHIAL BRUSHINGS;  Surgeon: [name], DO;  Location: [facility];  Service: Pulmonary;;   BRONCHIAL NEEDLE ASPIRATION BIOPSY  10/18/2020   Procedure: BRONCHIAL NEEDLE ASPIRATION BIOPSIES;  Surgeon: [name], DO;  Location: [facility];  Service: Pulmonary;;   BRONCHIAL WASHINGS  10/18/2020   Procedure: BRONCHIAL WASHINGS;  Surgeon: [name], DO;  Location: [facility];  Service: Pulmonary;;   hemorrhoidecotmy     HERNIA MESH REMOVAL     OPEN REDUCTION INTERNAL FIXATION (ORIF) DISTAL RADIAL FRACTURE Right 11/19/2019   Procedure: OPEN REDUCTION INTERNAL FIXATION (ORIF) DISTAL RADIUS AND ULNA FRACTURE WITH REPAIR AS NECESSARY;  Surgeon: Roseanne Kaufman, MD;  Location: [facility];  Service: Orthopedics;  Laterality: Right;  2 hrs Block with IV sedation   ORIF RADIUS & ULNA FRACTURES     TONSILLECTOMY     VIDEO BRONCHOSCOPY WITH ENDOBRONCHIAL NAVIGATION Left 10/18/2020   Procedure: VIDEO  BRONCHOSCOPY WITH ENDOBRONCHIAL NAVIGATION;  Surgeon: [name], DO;  Location: [facility];  Service: Pulmonary;  Laterality: Left;  ION   VIDEO BRONCHOSCOPY WITH ENDOBRONCHIAL ULTRASOUND Bilateral 10/18/2020   Procedure: VIDEO BRONCHOSCOPY WITH ENDOBRONCHIAL ULTRASOUND;  Surgeon: [name], DO;  Location: [facility];  Service: Pulmonary;  Laterality: Bilateral;   VIDEO BRONCHOSCOPY WITH RADIAL ENDOBRONCHIAL ULTRASOUND  10/18/2020   Procedure: VIDEO BRONCHOSCOPY WITH RADIAL ENDOBRONCHIAL ULTRASOUND;  Surgeon: [name], DO;  Location: [facility];  Service: Pulmonary;;    REVIEW OF SYSTEMS:   Constitutional: Negative for appetite change, chills, fatigue, fever and unexpected weight change.  HENT: Positive for decreased hearing bilaterally.  Negative for mouth sores, nosebleeds, sore throat (improved) and trouble swallowing.   Eyes: Negative for eye problems and icterus.  Respiratory: Positive for stable cough and dyspnea on exertion.  Negative for hemoptysis and wheezing.   Cardiovascular: Negative for chest pain and leg swelling.  Gastrointestinal: Positive for mild nausea x1.  Positive for chronic constipation.  Negative for abdominal pain, diarrhea, and vomiting.  Genitourinary: Negative for bladder incontinence, difficulty urinating, dysuria, frequency and hematuria.   Musculoskeletal: Positive for low back pain following injection. Negative for gait problem, neck pain and neck stiffness.  Skin: Negative for itching and rash.  Neurological: Negative for dizziness, extremity weakness, gait problem, headaches, light-headedness and seizures.  Hematological: Negative for adenopathy. Does not bruise/bleed easily.  Psychiatric/Behavioral: Negative for confusion, depression and sleep disturbance. The patient is not nervous/anxious.   PHYSICAL EXAMINATION:  Blood pressure 137/68, pulse 69, temperature 97.9 F (36.6 C), temperature source Tympanic, resp. rate 19, height  5\' 6"  (1.676 m), weight 191 lb 1.6 oz (86.7 kg), SpO2 96 %.  ECOG PERFORMANCE STATUS: 1  Physical Exam  Constitutional: Oriented to person, place, and time and well-developed, well-nourished, and in no distress.  HENT:  Head: Normocephalic and atraumatic.  Mouth/Throat: Oropharynx is clear and moist. No oropharyngeal exudate.  Eyes: Conjunctivae are normal. Right eye exhibits no discharge. Left eye exhibits no discharge. No scleral icterus.  Neck: Normal range of motion. Neck supple.  Cardiovascular: Normal rate, regular rhythm, normal heart sounds and intact distal pulses.   Pulmonary/Chest: Effort normal.  Positive for quiet breath sounds bilaterally.  No respiratory distress. No wheezes. No rales.  Abdominal: Soft. Bowel sounds are normal. Exhibits no distension and no mass. There is no tenderness.  Musculoskeletal: Normal range of motion. Exhibits no edema.  Lymphadenopathy:    No cervical adenopathy.  Neurological: Alert and oriented to person, place, and time. Exhibits normal muscle tone. Gait normal. Coordination normal.  Skin: Skin is warm and dry. No rash noted. Not diaphoretic. No erythema. No pallor.  Psychiatric: Mood, memory and judgment normal.  Vitals reviewed.  LABORATORY DATA: Lab Results  Component Value Date   WBC 8.0 11/21/2020   HGB 10.7 (L) 11/21/2020   HCT 31.5 (L) 11/21/2020   MCV 101.3 (H) 11/21/2020   PLT 232 11/21/2020      Chemistry      Component Value Date/Time   NA 140 11/21/2020 0755   K 3.9 11/21/2020 0755   CL 104 11/21/2020 0755   CO2 27 11/21/2020 0755   BUN 15 11/21/2020 0755   CREATININE 0.95 11/21/2020 0755      Component Value Date/Time   CALCIUM 8.4 (L) 11/21/2020 0755   ALKPHOS 108 11/21/2020 0755   AST 73 (H) 11/21/2020 0755   ALT 55 (H) 11/21/2020 0755   BILITOT <0.2 (L) 11/21/2020 0755       RADIOGRAPHIC STUDIES:  NM PET Image Initial (PI) Skull Base To Thigh  Result Date: 11/07/2020 CLINICAL DATA:  Initial  treatment strategy for small cell lung cancer. EXAM: NUCLEAR MEDICINE PET SKULL BASE TO THIGH TECHNIQUE: 9.57 mCi F-18 FDG was injected intravenously. Full-ring PET imaging was performed from the skull base to thigh after the radiotracer. CT data was obtained and used for attenuation correction and anatomic localization. Fasting blood glucose: 113 mg/dl COMPARISON:  CT scan 10/15/2020 FINDINGS: Mediastinal blood pool activity: SUV max 2.89 Liver activity: SUV max NA NECK: No hypermetabolic lymph nodes in the neck. Incidental CT findings: Bilateral carotid artery calcifications are noted. CHEST: The left lower lobe pulmonary lesion is hypermetabolic with SUV max of 2.33. Adjacent left infrahilar lymph node is also hypermetabolic with SUV max of 0.07. Subcarinal lymph node is hypermetabolic with SUV max of 6.22. No other enlarged or hypermetabolic mediastinal hilar lymph nodes. No other hypermetabolic pulmonary lesions are identified. No worrisome pulmonary nodules on the CT scan to suggest pulmonary metastatic disease. No breast masses, supraclavicular or axillary adenopathy. Incidental CT findings: Stable emphysematous changes and areas of pulmonary scarring. Stable aortic and coronary artery calcifications. ABDOMEN/PELVIS: No findings for abdominal/pelvic metastatic disease. No hepatic adrenal gland lesions. No enlarged or hypermetabolic lymph nodes. Incidental CT findings: Moderate to advanced atherosclerotic calcifications involving the aorta and branch vessels but no aneurysm. SKELETON: No findings for osseous metastatic disease. Uptake in the upper chest and not muscles is likely physiologic. Incidental CT findings: none IMPRESSION: 1. Hypermetabolic left lower lobe lesion consistent with known pulmonary neoplasm. Metastatic left infrahilar and subcarinal adenopathy. 2. No findings for abdominal/pelvic metastatic disease or osseous metastatic disease. Electronically Signed   By: Marijo Sanes M.D.   On:  11/07/2020 10:18   DG Chest Port 1 View  Result Date: 11/11/2020 CLINICAL DATA:  70 year old female with atrial fibrillation. EXAM: PORTABLE CHEST - 1 VIEW COMPARISON:  10/18/2020 FINDINGS: The mediastinal contours are within normal limits. No cardiomegaly. Atherosclerotic calcification of the aortic arch. Mild bibasilar subsegmental atelectasis. No focal consolidation, pneumothorax, or pleural effusion. No acute osseous abnormality. IMPRESSION: 1. Mild bibasilar subsegmental atelectasis. 2.  Aortic Atherosclerosis (ICD10-I70.0). Electronically Signed   By: Glade Nurse.D.  On: 11/11/2020 12:50     ASSESSMENT/PLAN:  This is a very pleasant 70 year old Caucasian female recently diagnosed with limited stage (T2 a, N 2, M0) small cell lung cancer presented with left lower lobe lung mass in addition to left infrahilar and subcarinal lymphadenopathy diagnosed in October 2022.   She is currently undergoing concurrent chemoradiation with cisplatin 80 mg per metered squared on days 1 and etoposide 100 mg per metered squared on days 1, 2, and 3 IV every 3 weeks.  She is status post her first cycle.  She tolerated it well except she developed neutropenia following treatment which required short acting GSF injections x 3.     Labs were reviewed. Reviewed with Dr. Julien Nordmann. Regarding her hearing loss, Dr. Julien Nordmann will reduce her dose of cisplatin to 60 mg/m2. He notes it would be unusual to have hearing loss after 1 cycle of treatment; therefore, we will refer her to ENT to evaluate her for ototoxicity vs other etiology of hearing loss. Recommend that she proceed with cycle #2 today scheduled.   We will continue to monitor her labs weekly. If needed, we will arrange for her to receive short acting GCSF injections.   I will arrange for restaging CT scan of the chest prior to starting her next cycle of treatment in 3 weeks.  She will continue taking her magnesium supplements p.o. daily.  The patient was  advised to call immediately if she has any concerning symptoms in the interval. The patient voices understanding of current disease status and treatment options and is in agreement with the current care plan. All questions were answered. The patient knows to call the clinic with any problems, questions or concerns. We can certainly see the patient much sooner if necessary        Orders Placed This Encounter  Procedures   CT Chest W Contrast    Standing Status:   Future    Standing Expiration Date:   11/21/2021    Order Specific Question:   If indicated for the ordered procedure, I authorize the administration of contrast media per Radiology protocol    Answer:   Yes    Order Specific Question:   Preferred imaging location?    Answer:   [facility]   Ambulatory referral to ENT    Referral Priority:   Urgent    Referral Type:   Consultation    Referral Reason:   Specialty Services Required    Requested Specialty:   Otolaryngology    Number of Visits Requested:   1      The total time spent in the appointment was 30-39 minutes.   Kourtlyn Charlet L Kenneth Cuaresma, PA-C 11/21/20

## 2020-11-16 ENCOUNTER — Encounter: Payer: Self-pay | Admitting: Pulmonary Disease

## 2020-11-16 ENCOUNTER — Inpatient Hospital Stay: Payer: Medicare HMO

## 2020-11-16 ENCOUNTER — Other Ambulatory Visit: Payer: Self-pay | Admitting: Physician Assistant

## 2020-11-16 ENCOUNTER — Telehealth: Payer: Self-pay | Admitting: Physician Assistant

## 2020-11-16 ENCOUNTER — Ambulatory Visit (INDEPENDENT_AMBULATORY_CARE_PROVIDER_SITE_OTHER): Payer: Medicare HMO | Admitting: Pulmonary Disease

## 2020-11-16 ENCOUNTER — Other Ambulatory Visit: Payer: Self-pay

## 2020-11-16 VITALS — BP 159/79 | HR 75 | Temp 98.3°F | Resp 18

## 2020-11-16 VITALS — BP 122/80 | HR 125 | Temp 98.5°F | Ht 66.0 in | Wt 184.6 lb

## 2020-11-16 DIAGNOSIS — T451X5A Adverse effect of antineoplastic and immunosuppressive drugs, initial encounter: Secondary | ICD-10-CM | POA: Diagnosis not present

## 2020-11-16 DIAGNOSIS — Z5111 Encounter for antineoplastic chemotherapy: Secondary | ICD-10-CM | POA: Diagnosis not present

## 2020-11-16 DIAGNOSIS — C3432 Malignant neoplasm of lower lobe, left bronchus or lung: Secondary | ICD-10-CM | POA: Diagnosis not present

## 2020-11-16 DIAGNOSIS — D701 Agranulocytosis secondary to cancer chemotherapy: Secondary | ICD-10-CM | POA: Diagnosis not present

## 2020-11-16 DIAGNOSIS — E876 Hypokalemia: Secondary | ICD-10-CM | POA: Diagnosis not present

## 2020-11-16 DIAGNOSIS — Z510 Encounter for antineoplastic radiation therapy: Secondary | ICD-10-CM | POA: Diagnosis not present

## 2020-11-16 DIAGNOSIS — Z87891 Personal history of nicotine dependence: Secondary | ICD-10-CM

## 2020-11-16 DIAGNOSIS — Z5189 Encounter for other specified aftercare: Secondary | ICD-10-CM | POA: Diagnosis not present

## 2020-11-16 DIAGNOSIS — C771 Secondary and unspecified malignant neoplasm of intrathoracic lymph nodes: Secondary | ICD-10-CM | POA: Diagnosis not present

## 2020-11-16 MED ORDER — TIOTROPIUM BROMIDE-OLODATEROL 2.5-2.5 MCG/ACT IN AERS: 2.0000 | INHALATION_SPRAY | Freq: Every day | RESPIRATORY_TRACT | 5 refills | Status: DC

## 2020-11-16 MED ORDER — STIOLTO RESPIMAT 2.5-2.5 MCG/ACT IN AERS: 2.0000 | INHALATION_SPRAY | Freq: Every day | RESPIRATORY_TRACT | 0 refills | Status: DC

## 2020-11-16 MED ORDER — FILGRASTIM-AAFI 300 MCG/0.5ML IJ SOSY
300.0000 ug | PREFILLED_SYRINGE | Freq: Every day | INTRAMUSCULAR | Status: DC
  Administered 2020-11-16: 300 ug via SUBCUTANEOUS
  Filled 2020-11-16: qty 0.5

## 2020-11-16 NOTE — Patient Instructions (Signed)
Filgrastim, G-CSF injection What is this medication? FILGRASTIM, G-CSF (fil GRA stim) is a granulocyte colony-stimulating factor that stimulates the growth of neutrophils, a type of white blood cell (WBC) important in the body's fight against infection. It is used to reduce the incidence of fever and infection in patients with certain types of cancer who are receiving chemotherapy that affects the bone marrow, to stimulate blood cell production for removal of WBCs from the body prior to a bone marrow transplantation, to reduce the incidence of fever and infection in patients who have severe chronic neutropenia, and to improve survival outcomes following high-dose radiation exposure that is toxic to the bone marrow. This medicine may be used for other purposes; ask your health care provider or pharmacist if you have questions. COMMON BRAND NAME(S): Neupogen, Nivestym, Releuko, Zarxio What should I tell my care team before I take this medication? They need to know if you have any of these conditions: kidney disease latex allergy ongoing radiation therapy sickle cell disease an unusual or allergic reaction to filgrastim, pegfilgrastim, other medicines, foods, dyes, or preservatives pregnant or trying to get pregnant breast-feeding How should I use this medication? This medicine is for injection under the skin or infusion into a vein. As an infusion into a vein, it is usually given by a health care professional in a hospital or clinic setting. If you get this medicine at home, you will be taught how to prepare and give this medicine. Refer to the Instructions for Use that come with your medication packaging. Use exactly as directed. Take your medicine at regular intervals. Do not take your medicine more often than directed. It is important that you put your used needles and syringes in a special sharps container. Do not put them in a trash can. If you do not have a sharps container, call your pharmacist  or healthcare provider to get one. Talk to your pediatrician regarding the use of this medicine in children. While this drug may be prescribed for children as young as 7 months for selected conditions, precautions do apply. Overdosage: If you think you have taken too much of this medicine contact a poison control center or emergency room at once. NOTE: This medicine is only for you. Do not share this medicine with others. What if I miss a dose? It is important not to miss your dose. Call your doctor or health care professional if you miss a dose. What may interact with this medication? This medicine may interact with the following medications: medicines that may cause a release of neutrophils, such as lithium This list may not describe all possible interactions. Give your health care provider a list of all the medicines, herbs, non-prescription drugs, or dietary supplements you use. Also tell them if you smoke, drink alcohol, or use illegal drugs. Some items may interact with your medicine. What should I watch for while using this medication? Your condition will be monitored carefully while you are receiving this medicine. You may need blood work done while you are taking this medicine. Talk to your health care provider about your risk of cancer. You may be more at risk for certain types of cancer if you take this medicine. What side effects may I notice from receiving this medication? Side effects that you should report to your doctor or health care professional as soon as possible: allergic reactions like skin rash, itching or hives, swelling of the face, lips, or tongue back pain dizziness or feeling faint fever pain, redness, or  irritation at site where injected pinpoint red spots on the skin shortness of breath or breathing problems signs and symptoms of kidney injury like trouble passing urine, change in the amount of urine, or red or dark-brown urine stomach or side pain, or pain at  the shoulder swelling tiredness unusual bleeding or bruising Side effects that usually do not require medical attention (report to your doctor or health care professional if they continue or are bothersome): bone pain cough diarrhea hair loss headache muscle pain This list may not describe all possible side effects. Call your doctor for medical advice about side effects. You may report side effects to FDA at 1-800-FDA-1088. Where should I keep my medication? Keep out of the reach of children. Store in a refrigerator between 2 and 8 degrees C (36 and 46 degrees F). Do not freeze. Keep in carton to protect from light. Throw away this medicine if vials or syringes are left out of the refrigerator for more than 24 hours. Throw away any unused medicine after the expiration date. NOTE: This sheet is a summary. It may not cover all possible information. If you have questions about this medicine, talk to your doctor, pharmacist, or health care provider.  2022 Elsevier/Gold Standard (2020-09-13 00:00:00)

## 2020-11-16 NOTE — Progress Notes (Signed)
Synopsis: Referred in Nov 2022 for hospital follow up by [name], MD  Subjective:   PATIENT ID: Patricia [name] GENDER: female DOB: 07/01/1950, MRN: [id number]  Chief Complaint  Patient presents with   Follow-up    S/p hospitalization, bronchoscopy, chemo start     70 year old female, past medical history of hyperlipidemia, hypertension, former smoker quit in December of this past year.  We initially met each other in the hospital after she was found to have a lung nodule and mediastinal adenopathy.  Patient underwent robotic assisted bronchoscopy and bronchoscopy endobronchial ultrasound transbronchial needle aspiration.  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  Patient has already started chemotherapy with Dr. Earlie Server at the cancer center.  She starting radiation treatments next week.  From respiratory standpoint she is doing okay.  She still has some shortness of breath with exertion and she uses her albuterol inhaler daily.  She is not on any maintenance inhalers.   Past Medical History:  Diagnosis Date   Anxiety    GERD (gastroesophageal reflux disease)    Hyperlipidemia    Hypertension    IBS (irritable bowel syndrome)    Insomnia    Low back pain    Lung cancer ([place]) 10/2020   TIA (transient ischemic attack) 10/2017   no deficits   Vitamin B12 deficiency    Vitamin D deficiency      Family History  Problem Relation Age of Onset   Breast cancer Sister    Breast cancer Maternal Aunt    Breast cancer Paternal Aunt      Past Surgical History:  Procedure Laterality Date   BACK SURGERY     BREAST EXCISIONAL BIOPSY Left 1997   benign   BRONCHIAL BIOPSY  10/18/2020   Procedure: BRONCHIAL BIOPSIES;  Surgeon: [name], DO;  Location: [facility];  Service: Pulmonary;;   BRONCHIAL BRUSHINGS  10/18/2020   Procedure: BRONCHIAL BRUSHINGS;  Surgeon: [name], DO;  Location: [facility];  Service:  Pulmonary;;   BRONCHIAL NEEDLE ASPIRATION BIOPSY  10/18/2020   Procedure: BRONCHIAL NEEDLE ASPIRATION BIOPSIES;  Surgeon: [name], DO;  Location: [facility];  Service: Pulmonary;;   BRONCHIAL WASHINGS  10/18/2020   Procedure: BRONCHIAL WASHINGS;  Surgeon: [name], DO;  Location: [facility];  Service: Pulmonary;;   hemorrhoidecotmy     HERNIA MESH REMOVAL     OPEN REDUCTION INTERNAL FIXATION (ORIF) DISTAL RADIAL FRACTURE Right 11/19/2019   Procedure: OPEN REDUCTION INTERNAL FIXATION (ORIF) DISTAL RADIUS AND ULNA FRACTURE WITH REPAIR AS NECESSARY;  Surgeon: Roseanne Kaufman, MD;  Location: [facility];  Service: Orthopedics;  Laterality: Right;  2 hrs Block with IV sedation   ORIF RADIUS & ULNA FRACTURES     TONSILLECTOMY     VIDEO BRONCHOSCOPY WITH ENDOBRONCHIAL NAVIGATION Left 10/18/2020   Procedure: VIDEO BRONCHOSCOPY WITH ENDOBRONCHIAL NAVIGATION;  Surgeon: [name], DO;  Location: [facility];  Service: Pulmonary;  Laterality: Left;  ION   VIDEO BRONCHOSCOPY WITH ENDOBRONCHIAL ULTRASOUND Bilateral 10/18/2020   Procedure: VIDEO BRONCHOSCOPY WITH ENDOBRONCHIAL ULTRASOUND;  Surgeon: [name], DO;  Location: [facility];  Service: Pulmonary;  Laterality: Bilateral;   VIDEO BRONCHOSCOPY WITH RADIAL ENDOBRONCHIAL ULTRASOUND  10/18/2020   Procedure: VIDEO BRONCHOSCOPY WITH RADIAL ENDOBRONCHIAL ULTRASOUND;  Surgeon: [name], DO;  Location: [facility];  Service: Pulmonary;;    Social History   Socioeconomic History   Marital status: Married    Spouse  name: Not on file   Number of children: Not on file   Years of education: Not on file   Highest education level: Not on file  Occupational History   Not on file  Tobacco Use   Smoking status: Former    Packs/day: 1.00    Years: 50.00    Pack years: 50.00    Types: Cigarettes    Start date: 70    Quit date: 12/14/2019    Years since quitting: 0.9   Smokeless tobacco: Never  Vaping Use    Vaping Use: Never used  Substance and Sexual Activity   Alcohol use: Not Currently    Comment: occ once every 2 months   Drug use: Never   Sexual activity: Not on file  Other Topics Concern   Not on file  Social History Narrative   Live with husband.  Education HS.  Retired.  Children 2 (daughter). 1 cup coffee.     Social Determinants of Health   Financial Resource Strain: Low Risk    Difficulty of Paying Living Expenses: Not hard at all  Food Insecurity: No Food Insecurity   Worried About Charity fundraiser in the Last Year: Never true   [place] in the Last Year: Never true  Transportation Needs: No Transportation Needs   Lack of Transportation (Medical): No   Lack of Transportation (Non-Medical): No  Physical Activity: Not on file  Stress: No Stress Concern Present   Feeling of Stress : Only a little  Social Connections: Engineer, building services of Communication with Friends and Family: More than three times a week   Frequency of Social Gatherings with Friends and Family: More than three times a week   Attends Religious Services: More than 4 times per year   Active Member of [employer] or Organizations: Yes   Attends Music therapist: More than 4 times per year   Marital Status: Married  Human resources officer Violence: Not on file     Allergies  Allergen Reactions   Augmentin [Amoxicillin-Pot Clavulanate] Nausea Only and Other (See Comments)    GI upset   Levaquin [Levofloxacin In D5w] Other (See Comments)    Joint problems    Mom [Magnesium Hydroxide] Other (See Comments)    Welts    Oxycodone Other (See Comments)    Nausea      Outpatient Medications Prior to Visit  Medication Sig Dispense Refill   acetaminophen (TYLENOL) 325 MG tablet 1 tablet as needed.     budesonide-formoterol (SYMBICORT) 80-4.5 MCG/ACT inhaler Take 2 puffs first thing in am and then another 2 puffs about 12 hours later. 1 each 12   carboxymethylcellulose (REFRESH PLUS)  0.5 % SOLN Place 1 drop into both eyes 3 (three) times daily as needed (for dryness).      carisoprodol (SOMA) 350 MG tablet Take 350 mg by mouth 3 (three) times daily.   5   Cholecalciferol (VITAMIN D3) 50 MCG (2000 UT) TABS Take 2,000 Units by mouth daily.     clobetasol ointment (TEMOVATE) 9.89 % Apply 1 application topically See admin instructions. Apply to vaginal area daily as directed     clonazePAM (KLONOPIN) 1 MG tablet Take 1 mg by mouth at bedtime.   3   ELIQUIS 5 MG TABS tablet TAKE 1 TABLET(5 MG) BY MOUTH TWICE DAILY 60 tablet 6   fluticasone (FLONASE) 50 MCG/ACT nasal spray Place 2 sprays into both nostrils daily as needed for allergies or rhinitis.  5   furosemide (LASIX) 20 MG tablet Take 1 tablet (20 mg total) by mouth daily. 30 tablet 2   gabapentin (NEURONTIN) 600 MG tablet Take 600 mg by mouth See admin instructions. Take 600 mg by mouth in the morning and at lunchtime     hyoscyamine (LEVSIN) 0.125 MG tablet Take 0.125 mg by mouth as needed.     lidocaine (XYLOCAINE) 2 % solution Use as directed 15 mLs in the mouth or throat every 6 (six) hours as needed for mouth pain. 100 mL 0   linaclotide (LINZESS) 145 MCG CAPS capsule Take 1 capsule (145 mcg total) by mouth daily at 4 PM. 30 capsule 0   magic mouthwash SOLN Take 5 mLs by mouth 4 (four) times daily as needed for mouth pain. Pt is allergic to Magnesium 240 mL 1   magnesium oxide (MAG-OX) 400 (240 Mg) MG tablet Take 1 tablet (400 mg total) by mouth daily. 30 tablet 1   metoprolol tartrate (LOPRESSOR) 50 MG tablet Take 1 tablet (50 mg total) by mouth 2 (two) times daily. 60 tablet 0   omeprazole (PRILOSEC) 20 MG capsule Take 20 mg by mouth daily before breakfast.      ondansetron (ZOFRAN) 4 MG tablet Take 1 tablet (4 mg total) by mouth every 8 (eight) hours as needed for nausea or vomiting. 30 tablet 1   ondansetron (ZOFRAN-ODT) 8 MG disintegrating tablet Take 8 mg by mouth 3 (three) times daily.     polyethylene glycol  (MIRALAX / GLYCOLAX) packet Take 34 g by mouth in the morning.      prochlorperazine (COMPAZINE) 10 MG tablet Take 1 tablet (10 mg total) by mouth every 6 (six) hours as needed for nausea or vomiting. 30 tablet 0   rosuvastatin (CRESTOR) 5 MG tablet Take 5 mg by mouth 3 (three) times a week. Mondays Wednesdays Fridays     sodium chloride 1 g tablet Take 2 tablets (2 g total) by mouth 3 (three) times daily with meals. (Patient taking differently: Take 1 g by mouth 3 (three) times daily with meals.) 180 tablet 0   traMADol (ULTRAM) 50 MG tablet Take 100 mg by mouth as needed.     VENTOLIN HFA 108 (90 Base) MCG/ACT inhaler Inhale 2 puffs into the lungs every 6 (six) hours as needed for wheezing or shortness of breath.   5   zaleplon (SONATA) 10 MG capsule Take 10 mg by mouth at bedtime as needed (for interrupted sleep).      potassium chloride SA (KLOR-CON) 20 MEQ tablet Take 1 tablet (20 mEq total) by mouth daily. (Patient not taking: Reported on 11/16/2020) 5 tablet 0   filgrastim-aafi (NIVESTYM) injection 300 mcg      No facility-administered medications prior to visit.    Review of Systems  Constitutional:  Negative for chills, fever, malaise/fatigue and weight loss.  HENT:  Negative for hearing loss, sore throat and tinnitus.   Eyes:  Negative for blurred vision and double vision.  Respiratory:  Positive for cough and shortness of breath. Negative for hemoptysis, sputum production, wheezing and stridor.   Cardiovascular:  Negative for chest pain, palpitations, orthopnea, leg swelling and PND.  Gastrointestinal:  Negative for abdominal pain, constipation, diarrhea, heartburn, nausea and vomiting.  Genitourinary:  Negative for dysuria, hematuria and urgency.  Musculoskeletal:  Negative for joint pain and myalgias.  Skin:  Negative for itching and rash.  Neurological:  Negative for dizziness, tingling, weakness and headaches.  Endo/Heme/Allergies:  Negative for environmental  allergies. Does not  bruise/bleed easily.  Psychiatric/Behavioral:  Negative for depression. The patient is not nervous/anxious and does not have insomnia.   All other systems reviewed and are negative.   Objective:  Physical Exam Vitals reviewed.  Constitutional:      General: She is not in acute distress.    Appearance: She is well-developed.  HENT:     Head: Normocephalic and atraumatic.  Eyes:     General: No scleral icterus.    Conjunctiva/sclera: Conjunctivae normal.     Pupils: Pupils are equal, round, and reactive to light.  Neck:     Vascular: No JVD.     Trachea: No tracheal deviation.  Cardiovascular:     Rate and Rhythm: Normal rate and regular rhythm.     Heart sounds: Normal heart sounds. No murmur heard. Pulmonary:     Effort: Pulmonary effort is normal. No tachypnea, accessory muscle usage or respiratory distress.     Breath sounds: No stridor. No wheezing, rhonchi or rales.  Abdominal:     General: Bowel sounds are normal. There is no distension.     Palpations: Abdomen is soft.     Tenderness: There is no abdominal tenderness.  Musculoskeletal:        General: No tenderness.     Cervical back: Neck supple.  Lymphadenopathy:     Cervical: No cervical adenopathy.  Skin:    General: Skin is warm and dry.     Capillary Refill: Capillary refill takes less than 2 seconds.     Findings: No rash.  Neurological:     Mental Status: She is alert and oriented to person, place, and time.  Psychiatric:        Behavior: Behavior normal.     Vitals:   11/16/20 1632  BP: 122/80  Pulse: (!) 125  Temp: 98.5 F (36.9 C)  TempSrc: Oral  SpO2: 97%  Weight: 184 lb 9.6 oz (83.7 kg)  Height: '5\' 6"'  (1.676 m)   97% on RA BMI Readings from Last 3 Encounters:  11/16/20 29.80 kg/m  11/11/20 30.34 kg/m  11/08/20 29.70 kg/m   Wt Readings from Last 3 Encounters:  11/16/20 184 lb 9.6 oz (83.7 kg)  11/11/20 188 lb (85.3 kg)  11/08/20 184 lb (83.5 kg)     CBC    Component Value  Date/Time   WBC 1.0 (L) 11/14/2020 1411   WBC 1.0 (LL) 11/11/2020 1155   RBC 3.55 (L) 11/14/2020 1411   HGB 12.2 11/14/2020 1411   HCT 35.5 (L) 11/14/2020 1411   PLT 111 (L) 11/14/2020 1411   MCV 100.0 11/14/2020 1411   MCH 34.4 (H) 11/14/2020 1411   MCHC 34.4 11/14/2020 1411   RDW 13.0 11/14/2020 1411   LYMPHSABS 0.8 11/14/2020 1411   MONOABS 0.1 11/14/2020 1411   EOSABS 0.1 11/14/2020 1411   BASOSABS 0.0 11/14/2020 1411      Chest Imaging: 11/04/2020 nuclear medicine PET scan: Hypermetabolic left lower lobe lung mass concerning for malignancy left hilar and subcarinal adenopathy. The patient's images have been independently reviewed by me.    Pulmonary Functions Testing Results: No flowsheet data found.  FeNO:   Pathology:   Echocardiogram:   Heart Catheterization:     Assessment & Plan:     ICD-10-CM   1. Former smoker  Z87.891 Pulmonary Function Test    2. 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 Chemotherapy induced neutropenia (HCC)  D70.1  T45.1X5A       Discussion:  This is a 71 year old female, new diagnosis of limited stage small cell undergoing chemotherapy and radiation.  Radiation is to start next week.  Unfortunately she did develop some neutropenia.  Followed closely at the cancer center.  Plan: She probably has underlying COPD with her longstanding history of tobacco abuse. I do think it makes any difference at this point getting PFTs. I think we should wait to see how she does over the next couple of weeks. We will go ahead and start her on maintenance inhaler therapy. Start her on bronchodilators for presumed diagnosis of COPD. Stiolto samples and new prescription today As needed albuterol as needed.  Patient follow-up with [place] in 6 months or as needed.    Current Outpatient Medications:    acetaminophen (TYLENOL) 325 MG tablet, 1 tablet as needed., Disp: , Rfl:    budesonide-formoterol  (SYMBICORT) 80-4.5 MCG/ACT inhaler, Take 2 puffs first thing in am and then another 2 puffs about 12 hours later., Disp: 1 each, Rfl: 12   carboxymethylcellulose (REFRESH PLUS) 0.5 % SOLN, Place 1 drop into both eyes 3 (three) times daily as needed (for dryness). , Disp: , Rfl:    carisoprodol (SOMA) 350 MG tablet, Take 350 mg by mouth 3 (three) times daily. , Disp: , Rfl: 5   Cholecalciferol (VITAMIN D3) 50 MCG (2000 UT) TABS, Take 2,000 Units by mouth daily., Disp: , Rfl:    clobetasol ointment (TEMOVATE) 7.02 %, Apply 1 application topically See admin instructions. Apply to vaginal area daily as directed, Disp: , Rfl:    clonazePAM (KLONOPIN) 1 MG tablet, Take 1 mg by mouth at bedtime. , Disp: , Rfl: 3   ELIQUIS 5 MG TABS tablet, TAKE 1 TABLET(5 MG) BY MOUTH TWICE DAILY, Disp: 60 tablet, Rfl: 6   fluticasone (FLONASE) 50 MCG/ACT nasal spray, Place 2 sprays into both nostrils daily as needed for allergies or rhinitis. , Disp: , Rfl: 5   furosemide (LASIX) 20 MG tablet, Take 1 tablet (20 mg total) by mouth daily., Disp: 30 tablet, Rfl: 2   gabapentin (NEURONTIN) 600 MG tablet, Take 600 mg by mouth See admin instructions. Take 600 mg by mouth in the morning and at lunchtime, Disp: , Rfl:    hyoscyamine (LEVSIN) 0.125 MG tablet, Take 0.125 mg by mouth as needed., Disp: , Rfl:    lidocaine (XYLOCAINE) 2 % solution, Use as directed 15 mLs in the mouth or throat every 6 (six) hours as needed for mouth pain., Disp: 100 mL, Rfl: 0   linaclotide (LINZESS) 145 MCG CAPS capsule, Take 1 capsule (145 mcg total) by mouth daily at 4 PM., Disp: 30 capsule, Rfl: 0   magic mouthwash SOLN, Take 5 mLs by mouth 4 (four) times daily as needed for mouth pain. Pt is allergic to Magnesium, Disp: 240 mL, Rfl: 1   magnesium oxide (MAG-OX) 400 (240 Mg) MG tablet, Take 1 tablet (400 mg total) by mouth daily., Disp: 30 tablet, Rfl: 1   metoprolol tartrate (LOPRESSOR) 50 MG tablet, Take 1 tablet (50 mg total) by mouth 2 (two)  times daily., Disp: 60 tablet, Rfl: 0   omeprazole (PRILOSEC) 20 MG capsule, Take 20 mg by mouth daily before breakfast. , Disp: , Rfl:    ondansetron (ZOFRAN) 4 MG tablet, Take 1 tablet (4 mg total) by mouth every 8 (eight) hours as needed for nausea or vomiting., Disp: 30 tablet, Rfl: 1   ondansetron (ZOFRAN-ODT) 8 MG disintegrating  tablet, Take 8 mg by mouth 3 (three) times daily., Disp: , Rfl:    polyethylene glycol (MIRALAX / GLYCOLAX) packet, Take 34 g by mouth in the morning. , Disp: , Rfl:    prochlorperazine (COMPAZINE) 10 MG tablet, Take 1 tablet (10 mg total) by mouth every 6 (six) hours as needed for nausea or vomiting., Disp: 30 tablet, Rfl: 0   rosuvastatin (CRESTOR) 5 MG tablet, Take 5 mg by mouth 3 (three) times a week. Mondays Wednesdays Fridays, Disp: , Rfl:    sodium chloride 1 g tablet, Take 2 tablets (2 g total) by mouth 3 (three) times daily with meals. (Patient taking differently: Take 1 g by mouth 3 (three) times daily with meals.), Disp: 180 tablet, Rfl: 0   traMADol (ULTRAM) 50 MG tablet, Take 100 mg by mouth as needed., Disp: , Rfl:    VENTOLIN HFA 108 (90 Base) MCG/ACT inhaler, Inhale 2 puffs into the lungs every 6 (six) hours as needed for wheezing or shortness of breath. , Disp: , Rfl: 5   zaleplon (SONATA) 10 MG capsule, Take 10 mg by mouth at bedtime as needed (for interrupted sleep). , Disp: , Rfl:    potassium chloride SA (KLOR-CON) 20 MEQ tablet, Take 1 tablet (20 mEq total) by mouth daily. (Patient not taking: Reported on 11/16/2020), Disp: 5 tablet, Rfl: 0   [name], DO  Pulmonary Critical Care 11/16/2020 4:50 PM

## 2020-11-16 NOTE — Telephone Encounter (Signed)
I called the patient to let her know that Faylene Kurtz has been approved due to her neutropenia. I have sent a scheduling message to get her injections daily for 3 days. Reviewed the purpose of this is to increase her WBC count. Discussed some patient's may develop bone pain from this injection and have found it helpful to take claritin daily and tylenol. Scheduling message sent to schedule this. She is aware that she will receive a phone call to schedule this.

## 2020-11-16 NOTE — Patient Instructions (Signed)
Thank you for visiting Dr. Valeta Harms at [facility]. Today we recommend the following:  Stiolto samples + new prescription  Continue albuterol   Return in about 6 months (around 05/16/2021) for with Eric Form, NP, or Dr. Valeta Harms.    Please do your part to reduce the spread of COVID-19.

## 2020-11-17 ENCOUNTER — Inpatient Hospital Stay: Payer: Medicare HMO

## 2020-11-17 VITALS — BP 128/67 | HR 65 | Temp 98.7°F | Resp 20

## 2020-11-17 DIAGNOSIS — T451X5A Adverse effect of antineoplastic and immunosuppressive drugs, initial encounter: Secondary | ICD-10-CM

## 2020-11-17 DIAGNOSIS — Z5111 Encounter for antineoplastic chemotherapy: Secondary | ICD-10-CM | POA: Diagnosis not present

## 2020-11-17 DIAGNOSIS — Z5189 Encounter for other specified aftercare: Secondary | ICD-10-CM | POA: Diagnosis not present

## 2020-11-17 DIAGNOSIS — E876 Hypokalemia: Secondary | ICD-10-CM | POA: Diagnosis not present

## 2020-11-17 DIAGNOSIS — C3432 Malignant neoplasm of lower lobe, left bronchus or lung: Secondary | ICD-10-CM | POA: Diagnosis not present

## 2020-11-17 DIAGNOSIS — D701 Agranulocytosis secondary to cancer chemotherapy: Secondary | ICD-10-CM

## 2020-11-17 DIAGNOSIS — C771 Secondary and unspecified malignant neoplasm of intrathoracic lymph nodes: Secondary | ICD-10-CM | POA: Diagnosis not present

## 2020-11-17 DIAGNOSIS — Z510 Encounter for antineoplastic radiation therapy: Secondary | ICD-10-CM | POA: Diagnosis not present

## 2020-11-17 MED ORDER — FILGRASTIM-AAFI 300 MCG/0.5ML IJ SOSY
300.0000 ug | PREFILLED_SYRINGE | Freq: Every day | INTRAMUSCULAR | Status: DC
  Administered 2020-11-17: 300 ug via SUBCUTANEOUS
  Filled 2020-11-17: qty 0.5

## 2020-11-18 ENCOUNTER — Inpatient Hospital Stay: Payer: Medicare HMO

## 2020-11-18 ENCOUNTER — Telehealth: Payer: Self-pay | Admitting: Cardiology

## 2020-11-18 ENCOUNTER — Other Ambulatory Visit: Payer: Self-pay

## 2020-11-18 VITALS — BP 145/75 | HR 125 | Temp 99.1°F | Resp 18

## 2020-11-18 DIAGNOSIS — D701 Agranulocytosis secondary to cancer chemotherapy: Secondary | ICD-10-CM

## 2020-11-18 DIAGNOSIS — C3432 Malignant neoplasm of lower lobe, left bronchus or lung: Secondary | ICD-10-CM | POA: Diagnosis not present

## 2020-11-18 DIAGNOSIS — Z5189 Encounter for other specified aftercare: Secondary | ICD-10-CM | POA: Diagnosis not present

## 2020-11-18 DIAGNOSIS — Z510 Encounter for antineoplastic radiation therapy: Secondary | ICD-10-CM | POA: Diagnosis not present

## 2020-11-18 DIAGNOSIS — E876 Hypokalemia: Secondary | ICD-10-CM | POA: Diagnosis not present

## 2020-11-18 DIAGNOSIS — T451X5A Adverse effect of antineoplastic and immunosuppressive drugs, initial encounter: Secondary | ICD-10-CM | POA: Diagnosis not present

## 2020-11-18 DIAGNOSIS — C771 Secondary and unspecified malignant neoplasm of intrathoracic lymph nodes: Secondary | ICD-10-CM | POA: Diagnosis not present

## 2020-11-18 DIAGNOSIS — Z5111 Encounter for antineoplastic chemotherapy: Secondary | ICD-10-CM | POA: Diagnosis not present

## 2020-11-18 MED ORDER — FILGRASTIM-AAFI 300 MCG/0.5ML IJ SOSY
300.0000 ug | PREFILLED_SYRINGE | Freq: Every day | INTRAMUSCULAR | Status: DC
  Administered 2020-11-18: 300 ug via SUBCUTANEOUS
  Filled 2020-11-18: qty 0.5

## 2020-11-18 MED FILL — Dexamethasone Sodium Phosphate Inj 100 MG/10ML: INTRAMUSCULAR | Qty: 1 | Status: AC

## 2020-11-18 MED FILL — Fosaprepitant Dimeglumine For IV Infusion 150 MG (Base Eq): INTRAVENOUS | Qty: 5 | Status: AC

## 2020-11-18 NOTE — Telephone Encounter (Signed)
  STAT if HR is under 50 or over 120 (normal HR is 60-100 beats per minute)  What is your heart rate? 145,136  Do you have a log of your heart rate readings (document readings)?   Do you have any other symptoms? Pt said her HR been elevated, she was at [facility] ED recently and they didn't know if she is tachycardia of having afib

## 2020-11-18 NOTE — Telephone Encounter (Signed)
Spoke with pt and was just seen and was doing good and then a few days ago noted elevated HR running anywhere from 126-145 Pt currently taking Metoprolol 50 mg bid  Pt was just seen at the [facility] ED location  EKG showing afib  Will forward to Dr Curt Bears for review and recommendations ./cy

## 2020-11-21 ENCOUNTER — Other Ambulatory Visit: Payer: Self-pay

## 2020-11-21 ENCOUNTER — Inpatient Hospital Stay: Payer: Medicare HMO

## 2020-11-21 ENCOUNTER — Ambulatory Visit
Admission: RE | Admit: 2020-11-21 | Discharge: 2020-11-21 | Disposition: A | Discharge status: Home / Self Care | Payer: Medicare HMO | Source: Ambulatory Visit | Attending: Radiation Oncology | Admitting: Radiation Oncology

## 2020-11-21 ENCOUNTER — Inpatient Hospital Stay (HOSPITAL_BASED_OUTPATIENT_CLINIC_OR_DEPARTMENT_OTHER): Payer: Medicare HMO | Admitting: Physician Assistant

## 2020-11-21 VITALS — BP 137/68 | HR 69 | Temp 97.9°F | Resp 19 | Ht 66.0 in | Wt 191.1 lb

## 2020-11-21 DIAGNOSIS — D701 Agranulocytosis secondary to cancer chemotherapy: Secondary | ICD-10-CM | POA: Diagnosis not present

## 2020-11-21 DIAGNOSIS — C3432 Malignant neoplasm of lower lobe, left bronchus or lung: Secondary | ICD-10-CM

## 2020-11-21 DIAGNOSIS — Z5189 Encounter for other specified aftercare: Secondary | ICD-10-CM | POA: Diagnosis not present

## 2020-11-21 DIAGNOSIS — H9193 Unspecified hearing loss, bilateral: Secondary | ICD-10-CM | POA: Diagnosis not present

## 2020-11-21 DIAGNOSIS — E876 Hypokalemia: Secondary | ICD-10-CM | POA: Diagnosis not present

## 2020-11-21 DIAGNOSIS — Z5111 Encounter for antineoplastic chemotherapy: Secondary | ICD-10-CM | POA: Diagnosis not present

## 2020-11-21 DIAGNOSIS — C771 Secondary and unspecified malignant neoplasm of intrathoracic lymph nodes: Secondary | ICD-10-CM | POA: Diagnosis not present

## 2020-11-21 DIAGNOSIS — Z510 Encounter for antineoplastic radiation therapy: Secondary | ICD-10-CM | POA: Diagnosis not present

## 2020-11-21 DIAGNOSIS — T451X5A Adverse effect of antineoplastic and immunosuppressive drugs, initial encounter: Secondary | ICD-10-CM | POA: Diagnosis not present

## 2020-11-21 LAB — CMP (CANCER CENTER ONLY)
ALT: 55 U/L — ABNORMAL HIGH (ref 0–44)
AST: 73 U/L — ABNORMAL HIGH (ref 15–41)
Albumin: 3.3 g/dL — ABNORMAL LOW (ref 3.5–5.0)
Alkaline Phosphatase: 108 U/L (ref 38–126)
Anion gap: 9 (ref 5–15)
BUN: 15 mg/dL (ref 8–23)
CO2: 27 mmol/L (ref 22–32)
Calcium: 8.4 mg/dL — ABNORMAL LOW (ref 8.9–10.3)
Chloride: 104 mmol/L (ref 98–111)
Creatinine: 0.95 mg/dL (ref 0.44–1.00)
GFR, Estimated: >60 mL/min (ref >60)
Glucose, Bld: 93 mg/dL (ref 70–99)
Potassium: 3.9 mmol/L (ref 3.5–5.1)
Sodium: 140 mmol/L (ref 135–145)
Total Bilirubin: <0.2 mg/dL — ABNORMAL LOW (ref 0.3–1.2)
Total Protein: 6 g/dL — ABNORMAL LOW (ref 6.5–8.1)

## 2020-11-21 LAB — MAGNESIUM: Magnesium: 1.6 mg/dL — ABNORMAL LOW (ref 1.7–2.4)

## 2020-11-21 LAB — CBC WITH DIFFERENTIAL (CANCER CENTER ONLY)
Abs Immature Granulocytes: 1.16 10*3/uL — ABNORMAL HIGH (ref 0.00–0.07)
Basophils Absolute: 0.1 10*3/uL (ref 0.0–0.1)
Basophils Relative: 1 %
Eosinophils Absolute: 0.1 10*3/uL (ref 0.0–0.5)
Eosinophils Relative: 1 %
HCT: 31.5 % — ABNORMAL LOW (ref 36.0–46.0)
Hemoglobin: 10.7 g/dL — ABNORMAL LOW (ref 12.0–15.0)
Immature Granulocytes: 15 %
Lymphocytes Relative: 14 %
Lymphs Abs: 1.1 10*3/uL (ref 0.7–4.0)
MCH: 34.4 pg — ABNORMAL HIGH (ref 26.0–34.0)
MCHC: 34 g/dL (ref 30.0–36.0)
MCV: 101.3 fL — ABNORMAL HIGH (ref 80.0–100.0)
Monocytes Absolute: 1.4 10*3/uL — ABNORMAL HIGH (ref 0.1–1.0)
Monocytes Relative: 17 %
Neutro Abs: 4.2 10*3/uL (ref 1.7–7.7)
Neutrophils Relative %: 52 %
Platelet Count: 232 10*3/uL (ref 150–400)
RBC: 3.11 MIL/uL — ABNORMAL LOW (ref 3.87–5.11)
RDW: 14.5 % (ref 11.5–15.5)
WBC Count: 8 10*3/uL (ref 4.0–10.5)
nRBC: 0 % (ref 0.0–0.2)

## 2020-11-21 MED ORDER — SODIUM CHLORIDE 0.9 % IV SOLN: Freq: Once | INTRAVENOUS | Status: AC

## 2020-11-21 MED ORDER — DEXAMETHASONE SODIUM PHOSPHATE 100 MG/10ML IJ SOLN
10.0000 mg | Freq: Once | INTRAMUSCULAR | Status: AC
  Administered 2020-11-21: 10 mg via INTRAVENOUS
  Filled 2020-11-21: qty 10

## 2020-11-21 MED ORDER — POTASSIUM CHLORIDE IN NACL 20-0.9 MEQ/L-% IV SOLN
Freq: Once | INTRAVENOUS | Status: AC
  Filled 2020-11-21: qty 1000

## 2020-11-21 MED ORDER — SODIUM CHLORIDE 0.9 % IV SOLN
60.0000 mg/m2 | Freq: Once | INTRAVENOUS | Status: AC
  Administered 2020-11-21: 120 mg via INTRAVENOUS
  Filled 2020-11-21: qty 120

## 2020-11-21 MED ORDER — SODIUM CHLORIDE 0.9 % IV SOLN
150.0000 mg | Freq: Once | INTRAVENOUS | Status: AC
  Administered 2020-11-21: 150 mg via INTRAVENOUS
  Filled 2020-11-21: qty 150

## 2020-11-21 MED ORDER — MAGNESIUM SULFATE 2 GM/50ML IV SOLN
2.0000 g | Freq: Once | INTRAVENOUS | Status: AC
  Administered 2020-11-21: 2 g via INTRAVENOUS
  Filled 2020-11-21: qty 50

## 2020-11-21 MED ORDER — SODIUM CHLORIDE 0.9 % IV SOLN
100.0000 mg/m2 | Freq: Once | INTRAVENOUS | Status: AC
  Administered 2020-11-21: 200 mg via INTRAVENOUS
  Filled 2020-11-21: qty 10

## 2020-11-21 MED ORDER — PALONOSETRON HCL INJECTION 0.25 MG/5ML
0.2500 mg | Freq: Once | INTRAVENOUS | Status: AC
  Administered 2020-11-21: 0.25 mg via INTRAVENOUS
  Filled 2020-11-21: qty 5

## 2020-11-21 MED FILL — Dexamethasone Sodium Phosphate Inj 100 MG/10ML: INTRAMUSCULAR | Qty: 1 | Status: AC

## 2020-11-21 NOTE — Patient Instructions (Signed)
[facility]  Discharge Instructions: Thank you for choosing [facility] to provide your oncology and hematology care.   If you have a lab appointment with the [facility], please go directly to the [facility] and check in at the registration area.   Wear comfortable clothing and clothing appropriate for easy access to any Portacath or PICC line.   We strive to give you quality time with your provider. You may need to reschedule your appointment if you arrive late (15 or more minutes).  Arriving late affects you and other patients whose appointments are after yours.  Also, if you miss three or more appointments without notifying the office, you may be dismissed from the clinic at the provider's discretion.      For prescription refill requests, have your pharmacy contact our office and allow 72 hours for refills to be completed.    Today you received the following chemotherapy and/or immunotherapy agents: Cisplatin & Etoposide    To help prevent nausea and vomiting after your treatment, we encourage you to take your nausea medication as directed.  BELOW ARE SYMPTOMS THAT SHOULD BE REPORTED IMMEDIATELY: *FEVER GREATER THAN 100.4 F (38 C) OR HIGHER *CHILLS OR SWEATING *NAUSEA AND VOMITING THAT IS NOT CONTROLLED WITH YOUR NAUSEA MEDICATION *UNUSUAL SHORTNESS OF BREATH *UNUSUAL BRUISING OR BLEEDING *URINARY PROBLEMS (pain or burning when urinating, or frequent urination) *BOWEL PROBLEMS (unusual diarrhea, constipation, pain near the anus) TENDERNESS IN MOUTH AND THROAT WITH OR WITHOUT PRESENCE OF ULCERS (sore throat, sores in mouth, or a toothache) UNUSUAL RASH, SWELLING OR PAIN  UNUSUAL VAGINAL DISCHARGE OR ITCHING   Items with * indicate a potential emergency and should be followed up as soon as possible or go to the Emergency Department if any problems should occur.  Please show the CHEMOTHERAPY ALERT CARD or IMMUNOTHERAPY ALERT CARD at  check-in to the Emergency Department and triage nurse.  Should you have questions after your visit or need to cancel or reschedule your appointment, please contact [facility]  Dept: [phone]  and follow the prompts.  Office hours are 8:00 a.m. to 4:30 p.m. Monday - Friday. Please note that voicemails left after 4:00 p.m. may not be returned until the following business day.  We are closed weekends and major holidays. You have access to a nurse at all times for urgent questions. Please call the main number to the clinic Dept: [phone] and follow the prompts.   For any non-urgent questions, you may also contact your provider using MyChart. We now offer e-Visits for anyone 71 and older to request care online for non-urgent symptoms. For details visit [email or web address].   Also download the MyChart app! Go to the app store, search "MyChart", open the app, select [facility], and log in with your MyChart username and password.  Due to Covid, a mask is required upon entering the hospital/clinic. If you do not have a mask, one will be given to you upon arrival. For doctor visits, patients may have 1 support person aged 71 or older with them. For treatment visits, patients cannot have anyone with them due to current Covid guidelines and our immunocompromised population.

## 2020-11-22 ENCOUNTER — Telehealth: Payer: Self-pay

## 2020-11-22 ENCOUNTER — Ambulatory Visit
Admission: RE | Admit: 2020-11-22 | Discharge: 2020-11-22 | Disposition: A | Discharge status: Home / Self Care | Payer: Medicare HMO | Source: Ambulatory Visit | Attending: Radiation Oncology | Admitting: Radiation Oncology

## 2020-11-22 ENCOUNTER — Inpatient Hospital Stay: Payer: Medicare HMO

## 2020-11-22 VITALS — BP 154/76 | HR 65 | Temp 98.1°F | Resp 18

## 2020-11-22 DIAGNOSIS — E876 Hypokalemia: Secondary | ICD-10-CM | POA: Diagnosis not present

## 2020-11-22 DIAGNOSIS — C3432 Malignant neoplasm of lower lobe, left bronchus or lung: Secondary | ICD-10-CM | POA: Diagnosis not present

## 2020-11-22 DIAGNOSIS — Z510 Encounter for antineoplastic radiation therapy: Secondary | ICD-10-CM | POA: Diagnosis not present

## 2020-11-22 DIAGNOSIS — C771 Secondary and unspecified malignant neoplasm of intrathoracic lymph nodes: Secondary | ICD-10-CM | POA: Diagnosis not present

## 2020-11-22 DIAGNOSIS — D701 Agranulocytosis secondary to cancer chemotherapy: Secondary | ICD-10-CM | POA: Diagnosis not present

## 2020-11-22 DIAGNOSIS — Z5111 Encounter for antineoplastic chemotherapy: Secondary | ICD-10-CM | POA: Diagnosis not present

## 2020-11-22 DIAGNOSIS — Z5189 Encounter for other specified aftercare: Secondary | ICD-10-CM | POA: Diagnosis not present

## 2020-11-22 DIAGNOSIS — T451X5A Adverse effect of antineoplastic and immunosuppressive drugs, initial encounter: Secondary | ICD-10-CM | POA: Diagnosis not present

## 2020-11-22 MED ORDER — SODIUM CHLORIDE 0.9 % IV SOLN
100.0000 mg/m2 | Freq: Once | INTRAVENOUS | Status: AC
  Administered 2020-11-22: 200 mg via INTRAVENOUS
  Filled 2020-11-22: qty 10

## 2020-11-22 MED ORDER — SODIUM CHLORIDE 0.9 % IV SOLN
10.0000 mg | Freq: Once | INTRAVENOUS | Status: AC
  Administered 2020-11-22: 10 mg via INTRAVENOUS
  Filled 2020-11-22: qty 10

## 2020-11-22 MED ORDER — SODIUM CHLORIDE 0.9 % IV SOLN: Freq: Once | INTRAVENOUS | Status: AC

## 2020-11-22 MED FILL — Dexamethasone Sodium Phosphate Inj 100 MG/10ML: INTRAMUSCULAR | Qty: 1 | Status: AC

## 2020-11-22 NOTE — Telephone Encounter (Signed)
ENT referral has been re-directed to Dr. Tamsen Roers [place]. [place]: [id number], [facility]: [phone]. Fax confirmation received.

## 2020-11-22 NOTE — Patient Instructions (Signed)
[facility]  Discharge Instructions: Thank you for choosing [facility] to provide your oncology and hematology care.   If you have a lab appointment with the Cancer Center, please go directly to the Cancer Center and check in at the registration area.   Wear comfortable clothing and clothing appropriate for easy access to any Portacath or PICC line.   We strive to give you quality time with your provider. You may need to reschedule your appointment if you arrive late (15 or more minutes).  Arriving late affects you and other patients whose appointments are after yours.  Also, if you miss three or more appointments without notifying the office, you may be dismissed from the clinic at the provider's discretion.      For prescription refill requests, have your pharmacy contact our office and allow 72 hours for refills to be completed.    Today you received the following chemotherapy and/or immunotherapy agents: etoposide.     To help prevent nausea and vomiting after your treatment, we encourage you to take your nausea medication as directed.  BELOW ARE SYMPTOMS THAT SHOULD BE REPORTED IMMEDIATELY: . *FEVER GREATER THAN 100.4 F (38 C) OR HIGHER . *CHILLS OR SWEATING . *NAUSEA AND VOMITING THAT IS NOT CONTROLLED WITH YOUR NAUSEA MEDICATION . *UNUSUAL SHORTNESS OF BREATH . *UNUSUAL BRUISING OR BLEEDING . *URINARY PROBLEMS (pain or burning when urinating, or frequent urination) . *BOWEL PROBLEMS (unusual diarrhea, constipation, pain near the anus) . TENDERNESS IN MOUTH AND THROAT WITH OR WITHOUT PRESENCE OF ULCERS (sore throat, sores in mouth, or a toothache) . UNUSUAL RASH, SWELLING OR PAIN  . UNUSUAL VAGINAL DISCHARGE OR ITCHING   Items with * indicate a potential emergency and should be followed up as soon as possible or go to the Emergency Department if any problems should occur.  Please show the CHEMOTHERAPY ALERT CARD or IMMUNOTHERAPY ALERT  CARD at check-in to the Emergency Department and triage nurse.  Should you have questions after your visit or need to cancel or reschedule your appointment, please contact  [facility]ANCER CENTER MED[facility]  Dept: [phone]  and follow the prompts.  Office hours are 8:00 a.m. to 4:30 p.m. Monday - Friday. Please note that voicemails left after 4:00 p.m. may not be returned until the following business day.  We are closed weekends and major holidays. You have access to a nurse at all times for urgent questions. Please call the main number to the clinic Dept: [phone] and follow the prompts.   For any non-urgent questions, you may also contact your provider using MyChart. We now offer e-Visits for anyone 18 and older to request care online for non-urgent symptoms. For details visit [email or web address].   Also download the MyChart app! Go to the app store, search "MyChart", open the app, select , and log in with your MyChart username and password.  Due to Covid, a mask is required upon entering the hospital/clinic. If you do not have a mask, one will be given to you upon arrival. For doctor visits, patients may have 1 support person aged 18 or older with them. For treatment visits, patients cannot have anyone with them due to current Covid guidelines and our immunocompromised population.   

## 2020-11-23 ENCOUNTER — Inpatient Hospital Stay: Payer: Medicare HMO

## 2020-11-23 ENCOUNTER — Ambulatory Visit
Admission: RE | Admit: 2020-11-23 | Discharge: 2020-11-23 | Disposition: A | Discharge status: Home / Self Care | Payer: Medicare HMO | Source: Ambulatory Visit | Attending: Radiation Oncology | Admitting: Radiation Oncology

## 2020-11-23 ENCOUNTER — Other Ambulatory Visit: Payer: Self-pay

## 2020-11-23 VITALS — BP 163/88 | HR 68 | Temp 98.0°F | Resp 16

## 2020-11-23 DIAGNOSIS — C771 Secondary and unspecified malignant neoplasm of intrathoracic lymph nodes: Secondary | ICD-10-CM | POA: Diagnosis not present

## 2020-11-23 DIAGNOSIS — T451X5A Adverse effect of antineoplastic and immunosuppressive drugs, initial encounter: Secondary | ICD-10-CM | POA: Diagnosis not present

## 2020-11-23 DIAGNOSIS — Z510 Encounter for antineoplastic radiation therapy: Secondary | ICD-10-CM | POA: Diagnosis not present

## 2020-11-23 DIAGNOSIS — E876 Hypokalemia: Secondary | ICD-10-CM | POA: Diagnosis not present

## 2020-11-23 DIAGNOSIS — C3432 Malignant neoplasm of lower lobe, left bronchus or lung: Secondary | ICD-10-CM | POA: Diagnosis not present

## 2020-11-23 DIAGNOSIS — Z5189 Encounter for other specified aftercare: Secondary | ICD-10-CM | POA: Diagnosis not present

## 2020-11-23 DIAGNOSIS — Z5111 Encounter for antineoplastic chemotherapy: Secondary | ICD-10-CM | POA: Diagnosis not present

## 2020-11-23 DIAGNOSIS — D701 Agranulocytosis secondary to cancer chemotherapy: Secondary | ICD-10-CM | POA: Diagnosis not present

## 2020-11-23 MED ORDER — SODIUM CHLORIDE 0.9 % IV SOLN: Freq: Once | INTRAVENOUS | Status: AC

## 2020-11-23 MED ORDER — SODIUM CHLORIDE 0.9 % IV SOLN
100.0000 mg/m2 | Freq: Once | INTRAVENOUS | Status: AC
  Administered 2020-11-23: 200 mg via INTRAVENOUS
  Filled 2020-11-23: qty 10

## 2020-11-23 MED ORDER — SODIUM CHLORIDE 0.9 % IV SOLN
10.0000 mg | Freq: Once | INTRAVENOUS | Status: AC
  Administered 2020-11-23: 10 mg via INTRAVENOUS
  Filled 2020-11-23: qty 10

## 2020-11-23 NOTE — Telephone Encounter (Signed)
Pt scheduled w/ PA on 11/29

## 2020-11-23 NOTE — Patient Instructions (Signed)
[facility]  Discharge Instructions: Thank you for choosing [facility] to provide your oncology and hematology care.   If you have a lab appointment with the Cancer Center, please go directly to the Cancer Center and check in at the registration area.   Wear comfortable clothing and clothing appropriate for easy access to any Portacath or PICC line.   We strive to give you quality time with your provider. You may need to reschedule your appointment if you arrive late (15 or more minutes).  Arriving late affects you and other patients whose appointments are after yours.  Also, if you miss three or more appointments without notifying the office, you may be dismissed from the clinic at the provider's discretion.      For prescription refill requests, have your pharmacy contact our office and allow 72 hours for refills to be completed.    Today you received the following chemotherapy and/or immunotherapy agents: etoposide.     To help prevent nausea and vomiting after your treatment, we encourage you to take your nausea medication as directed.  BELOW ARE SYMPTOMS THAT SHOULD BE REPORTED IMMEDIATELY: . *FEVER GREATER THAN 100.4 F (38 C) OR HIGHER . *CHILLS OR SWEATING . *NAUSEA AND VOMITING THAT IS NOT CONTROLLED WITH YOUR NAUSEA MEDICATION . *UNUSUAL SHORTNESS OF BREATH . *UNUSUAL BRUISING OR BLEEDING . *URINARY PROBLEMS (pain or burning when urinating, or frequent urination) . *BOWEL PROBLEMS (unusual diarrhea, constipation, pain near the anus) . TENDERNESS IN MOUTH AND THROAT WITH OR WITHOUT PRESENCE OF ULCERS (sore throat, sores in mouth, or a toothache) . UNUSUAL RASH, SWELLING OR PAIN  . UNUSUAL VAGINAL DISCHARGE OR ITCHING   Items with * indicate a potential emergency and should be followed up as soon as possible or go to the Emergency Department if any problems should occur.  Please show the CHEMOTHERAPY ALERT CARD or IMMUNOTHERAPY ALERT  CARD at check-in to the Emergency Department and triage nurse.  Should you have questions after your visit or need to cancel or reschedule your appointment, please contact [facility]  Dept: [phone]  and follow the prompts.  Office hours are 8:00 a.m. to 4:30 p.m. Monday - Friday. Please note that voicemails left after 4:00 p.m. may not be returned until the following business day.  We are closed weekends and major holidays. You have access to a nurse at all times for urgent questions. Please call the main number to the clinic Dept: [phone] and follow the prompts.   For any non-urgent questions, you may also contact your provider using MyChart. We now offer e-Visits for anyone 18 and older to request care online for non-urgent symptoms. For details visit [email or web address].   Also download the MyChart app! Go to the app store, search "MyChart", open the app, select [facility], and log in with your MyChart username and password.  Due to Covid, a mask is required upon entering the hospital/clinic. If you do not have a mask, one will be given to you upon arrival. For doctor visits, patients may have 1 support person aged 18 or older with them. For treatment visits, patients cannot have anyone with them due to current Covid guidelines and our immunocompromised population.   

## 2020-11-24 ENCOUNTER — Ambulatory Visit
Admission: RE | Admit: 2020-11-24 | Discharge: 2020-11-24 | Disposition: A | Discharge status: Home / Self Care | Payer: Medicare HMO | Source: Ambulatory Visit | Attending: Radiation Oncology | Admitting: Radiation Oncology

## 2020-11-24 ENCOUNTER — Other Ambulatory Visit: Payer: Self-pay

## 2020-11-24 DIAGNOSIS — C771 Secondary and unspecified malignant neoplasm of intrathoracic lymph nodes: Secondary | ICD-10-CM | POA: Diagnosis not present

## 2020-11-24 DIAGNOSIS — T451X5A Adverse effect of antineoplastic and immunosuppressive drugs, initial encounter: Secondary | ICD-10-CM | POA: Diagnosis not present

## 2020-11-24 DIAGNOSIS — D701 Agranulocytosis secondary to cancer chemotherapy: Secondary | ICD-10-CM | POA: Diagnosis not present

## 2020-11-24 DIAGNOSIS — E876 Hypokalemia: Secondary | ICD-10-CM | POA: Diagnosis not present

## 2020-11-24 DIAGNOSIS — Z510 Encounter for antineoplastic radiation therapy: Secondary | ICD-10-CM | POA: Diagnosis not present

## 2020-11-24 DIAGNOSIS — C3432 Malignant neoplasm of lower lobe, left bronchus or lung: Secondary | ICD-10-CM | POA: Diagnosis not present

## 2020-11-24 DIAGNOSIS — Z5111 Encounter for antineoplastic chemotherapy: Secondary | ICD-10-CM | POA: Diagnosis not present

## 2020-11-24 DIAGNOSIS — Z5189 Encounter for other specified aftercare: Secondary | ICD-10-CM | POA: Diagnosis not present

## 2020-11-24 NOTE — Progress Notes (Signed)
Pt here for patient teaching.  Pt given Radiation and You booklet, skin care instructions, and Sonafine.  Reviewed areas of pertinence such as fatigue, hair loss, skin changes, throat changes, cough, and shortness of breath . Pt able to give teach back of to pat skin and use unscented/gentle soap,apply Sonafine bid and avoid applying anything to skin within 4 hours of treatment. Pt verbalizes understanding of information given and will contact nursing with any questions or concerns.     [email or web address]

## 2020-11-25 ENCOUNTER — Other Ambulatory Visit: Payer: Self-pay

## 2020-11-25 ENCOUNTER — Ambulatory Visit
Admission: RE | Admit: 2020-11-25 | Discharge: 2020-11-25 | Disposition: A | Discharge status: Home / Self Care | Payer: Medicare HMO | Source: Ambulatory Visit | Attending: Radiation Oncology | Admitting: Radiation Oncology

## 2020-11-25 DIAGNOSIS — R911 Solitary pulmonary nodule: Secondary | ICD-10-CM

## 2020-11-25 DIAGNOSIS — E876 Hypokalemia: Secondary | ICD-10-CM | POA: Diagnosis not present

## 2020-11-25 DIAGNOSIS — Z5111 Encounter for antineoplastic chemotherapy: Secondary | ICD-10-CM | POA: Diagnosis not present

## 2020-11-25 DIAGNOSIS — T451X5A Adverse effect of antineoplastic and immunosuppressive drugs, initial encounter: Secondary | ICD-10-CM | POA: Diagnosis not present

## 2020-11-25 DIAGNOSIS — D701 Agranulocytosis secondary to cancer chemotherapy: Secondary | ICD-10-CM | POA: Diagnosis not present

## 2020-11-25 DIAGNOSIS — C771 Secondary and unspecified malignant neoplasm of intrathoracic lymph nodes: Secondary | ICD-10-CM | POA: Diagnosis not present

## 2020-11-25 DIAGNOSIS — Z5189 Encounter for other specified aftercare: Secondary | ICD-10-CM | POA: Diagnosis not present

## 2020-11-25 DIAGNOSIS — Z510 Encounter for antineoplastic radiation therapy: Secondary | ICD-10-CM | POA: Diagnosis not present

## 2020-11-25 DIAGNOSIS — C3432 Malignant neoplasm of lower lobe, left bronchus or lung: Secondary | ICD-10-CM | POA: Diagnosis not present

## 2020-11-25 MED ORDER — SONAFINE EX EMUL
1.0000 "application " | Freq: Once | CUTANEOUS | Status: AC
  Administered 2020-11-25: 1 via TOPICAL

## 2020-11-28 ENCOUNTER — Other Ambulatory Visit: Payer: Self-pay

## 2020-11-28 ENCOUNTER — Ambulatory Visit
Admission: RE | Admit: 2020-11-28 | Discharge: 2020-11-28 | Disposition: A | Discharge status: Home / Self Care | Payer: Medicare HMO | Source: Ambulatory Visit | Attending: Radiation Oncology | Admitting: Radiation Oncology

## 2020-11-28 ENCOUNTER — Inpatient Hospital Stay: Payer: Medicare HMO

## 2020-11-28 DIAGNOSIS — H903 Sensorineural hearing loss, bilateral: Secondary | ICD-10-CM | POA: Diagnosis not present

## 2020-11-28 DIAGNOSIS — Z510 Encounter for antineoplastic radiation therapy: Secondary | ICD-10-CM | POA: Diagnosis not present

## 2020-11-28 DIAGNOSIS — C3432 Malignant neoplasm of lower lobe, left bronchus or lung: Secondary | ICD-10-CM

## 2020-11-28 DIAGNOSIS — T451X5A Adverse effect of antineoplastic and immunosuppressive drugs, initial encounter: Secondary | ICD-10-CM | POA: Diagnosis not present

## 2020-11-28 DIAGNOSIS — D701 Agranulocytosis secondary to cancer chemotherapy: Secondary | ICD-10-CM | POA: Diagnosis not present

## 2020-11-28 DIAGNOSIS — Z5111 Encounter for antineoplastic chemotherapy: Secondary | ICD-10-CM | POA: Diagnosis not present

## 2020-11-28 DIAGNOSIS — Z5189 Encounter for other specified aftercare: Secondary | ICD-10-CM | POA: Diagnosis not present

## 2020-11-28 DIAGNOSIS — C771 Secondary and unspecified malignant neoplasm of intrathoracic lymph nodes: Secondary | ICD-10-CM | POA: Diagnosis not present

## 2020-11-28 DIAGNOSIS — E876 Hypokalemia: Secondary | ICD-10-CM | POA: Diagnosis not present

## 2020-11-28 DIAGNOSIS — H6983 Other specified disorders of Eustachian tube, bilateral: Secondary | ICD-10-CM | POA: Diagnosis not present

## 2020-11-28 LAB — CBC WITH DIFFERENTIAL (CANCER CENTER ONLY)
Abs Immature Granulocytes: 0.01 10*3/uL (ref 0.00–0.07)
Basophils Absolute: 0 10*3/uL (ref 0.0–0.1)
Basophils Relative: 2 %
Eosinophils Absolute: 0 10*3/uL (ref 0.0–0.5)
Eosinophils Relative: 1 %
HCT: 28.5 % — ABNORMAL LOW (ref 36.0–46.0)
Hemoglobin: 9.9 g/dL — ABNORMAL LOW (ref 12.0–15.0)
Immature Granulocytes: 1 %
Lymphocytes Relative: 36 %
Lymphs Abs: 0.5 10*3/uL — ABNORMAL LOW (ref 0.7–4.0)
MCH: 34.5 pg — ABNORMAL HIGH (ref 26.0–34.0)
MCHC: 34.7 g/dL (ref 30.0–36.0)
MCV: 99.3 fL (ref 80.0–100.0)
Monocytes Absolute: 0 10*3/uL — ABNORMAL LOW (ref 0.1–1.0)
Monocytes Relative: 2 %
Neutro Abs: 0.8 10*3/uL — ABNORMAL LOW (ref 1.7–7.7)
Neutrophils Relative %: 58 %
Platelet Count: 151 10*3/uL (ref 150–400)
RBC: 2.87 MIL/uL — ABNORMAL LOW (ref 3.87–5.11)
RDW: 13.8 % (ref 11.5–15.5)
WBC Count: 1.4 10*3/uL — ABNORMAL LOW (ref 4.0–10.5)
nRBC: 0 % (ref 0.0–0.2)

## 2020-11-28 LAB — CMP (CANCER CENTER ONLY)
ALT: 28 U/L (ref 0–44)
AST: 15 U/L (ref 15–41)
Albumin: 3.5 g/dL (ref 3.5–5.0)
Alkaline Phosphatase: 92 U/L (ref 38–126)
Anion gap: 7 (ref 5–15)
BUN: 18 mg/dL (ref 8–23)
CO2: 28 mmol/L (ref 22–32)
Calcium: 8.4 mg/dL — ABNORMAL LOW (ref 8.9–10.3)
Chloride: 102 mmol/L (ref 98–111)
Creatinine: 0.8 mg/dL (ref 0.44–1.00)
GFR, Estimated: >60 mL/min (ref >60)
Glucose, Bld: 94 mg/dL (ref 70–99)
Potassium: 4 mmol/L (ref 3.5–5.1)
Sodium: 137 mmol/L (ref 135–145)
Total Bilirubin: 0.5 mg/dL (ref 0.3–1.2)
Total Protein: 6.2 g/dL — ABNORMAL LOW (ref 6.5–8.1)

## 2020-11-28 LAB — MAGNESIUM: Magnesium: 1.3 mg/dL — ABNORMAL LOW (ref 1.7–2.4)

## 2020-11-29 ENCOUNTER — Ambulatory Visit
Admission: RE | Admit: 2020-11-29 | Discharge: 2020-11-29 | Disposition: A | Discharge status: Home / Self Care | Payer: Medicare HMO | Source: Ambulatory Visit | Attending: Radiation Oncology | Admitting: Radiation Oncology

## 2020-11-29 DIAGNOSIS — C771 Secondary and unspecified malignant neoplasm of intrathoracic lymph nodes: Secondary | ICD-10-CM | POA: Diagnosis not present

## 2020-11-29 DIAGNOSIS — Z5189 Encounter for other specified aftercare: Secondary | ICD-10-CM | POA: Diagnosis not present

## 2020-11-29 DIAGNOSIS — H903 Sensorineural hearing loss, bilateral: Secondary | ICD-10-CM | POA: Diagnosis not present

## 2020-11-29 DIAGNOSIS — H6983 Other specified disorders of Eustachian tube, bilateral: Secondary | ICD-10-CM | POA: Diagnosis not present

## 2020-11-29 DIAGNOSIS — E876 Hypokalemia: Secondary | ICD-10-CM | POA: Diagnosis not present

## 2020-11-29 DIAGNOSIS — D701 Agranulocytosis secondary to cancer chemotherapy: Secondary | ICD-10-CM | POA: Diagnosis not present

## 2020-11-29 DIAGNOSIS — T451X5A Adverse effect of antineoplastic and immunosuppressive drugs, initial encounter: Secondary | ICD-10-CM | POA: Diagnosis not present

## 2020-11-29 DIAGNOSIS — Z510 Encounter for antineoplastic radiation therapy: Secondary | ICD-10-CM | POA: Diagnosis not present

## 2020-11-29 DIAGNOSIS — C3432 Malignant neoplasm of lower lobe, left bronchus or lung: Secondary | ICD-10-CM | POA: Diagnosis not present

## 2020-11-29 DIAGNOSIS — Z5111 Encounter for antineoplastic chemotherapy: Secondary | ICD-10-CM | POA: Diagnosis not present

## 2020-11-30 ENCOUNTER — Ambulatory Visit
Admission: RE | Admit: 2020-11-30 | Discharge: 2020-11-30 | Disposition: A | Discharge status: Home / Self Care | Payer: Medicare HMO | Source: Ambulatory Visit | Attending: Radiation Oncology | Admitting: Radiation Oncology

## 2020-11-30 ENCOUNTER — Other Ambulatory Visit: Payer: Self-pay | Admitting: Radiation Oncology

## 2020-11-30 ENCOUNTER — Other Ambulatory Visit: Payer: Self-pay

## 2020-11-30 DIAGNOSIS — Z510 Encounter for antineoplastic radiation therapy: Secondary | ICD-10-CM | POA: Diagnosis not present

## 2020-11-30 DIAGNOSIS — E876 Hypokalemia: Secondary | ICD-10-CM | POA: Diagnosis not present

## 2020-11-30 DIAGNOSIS — I10 Essential (primary) hypertension: Secondary | ICD-10-CM

## 2020-11-30 DIAGNOSIS — Z5189 Encounter for other specified aftercare: Secondary | ICD-10-CM | POA: Diagnosis not present

## 2020-11-30 DIAGNOSIS — C771 Secondary and unspecified malignant neoplasm of intrathoracic lymph nodes: Secondary | ICD-10-CM | POA: Diagnosis not present

## 2020-11-30 DIAGNOSIS — D701 Agranulocytosis secondary to cancer chemotherapy: Secondary | ICD-10-CM | POA: Diagnosis not present

## 2020-11-30 DIAGNOSIS — C3432 Malignant neoplasm of lower lobe, left bronchus or lung: Secondary | ICD-10-CM | POA: Diagnosis not present

## 2020-11-30 DIAGNOSIS — T451X5A Adverse effect of antineoplastic and immunosuppressive drugs, initial encounter: Secondary | ICD-10-CM | POA: Diagnosis not present

## 2020-11-30 DIAGNOSIS — Z5111 Encounter for antineoplastic chemotherapy: Secondary | ICD-10-CM | POA: Diagnosis not present

## 2020-11-30 MED ORDER — NYSTATIN 100000 UNIT/ML MT SUSP: 5.0000 mL | Freq: Four times a day (QID) | OROMUCOSAL | 0 refills | Status: DC

## 2020-11-30 MED ORDER — SUCRALFATE 1 G PO TABS: 1.0000 g | ORAL_TABLET | Freq: Three times a day (TID) | ORAL | 2 refills | Status: DC

## 2020-12-05 ENCOUNTER — Inpatient Hospital Stay: Payer: Medicare HMO

## 2020-12-05 ENCOUNTER — Telehealth: Payer: Self-pay | Admitting: Medical Oncology

## 2020-12-05 ENCOUNTER — Other Ambulatory Visit: Payer: Self-pay

## 2020-12-05 ENCOUNTER — Other Ambulatory Visit: Payer: Self-pay | Admitting: Physician Assistant

## 2020-12-05 ENCOUNTER — Encounter: Payer: Self-pay | Admitting: Internal Medicine

## 2020-12-05 ENCOUNTER — Ambulatory Visit
Admission: RE | Admit: 2020-12-05 | Discharge: 2020-12-05 | Disposition: A | Discharge status: Home / Self Care | Payer: Medicare HMO | Source: Ambulatory Visit | Attending: Radiation Oncology | Admitting: Radiation Oncology

## 2020-12-05 ENCOUNTER — Encounter: Payer: Self-pay | Admitting: Physician Assistant

## 2020-12-05 ENCOUNTER — Other Ambulatory Visit (HOSPITAL_COMMUNITY): Payer: Self-pay

## 2020-12-05 ENCOUNTER — Encounter: Payer: Self-pay | Admitting: Radiation Oncology

## 2020-12-05 ENCOUNTER — Other Ambulatory Visit: Payer: Self-pay | Admitting: *Deleted

## 2020-12-05 VITALS — BP 135/72 | HR 62 | Temp 98.8°F | Resp 16

## 2020-12-05 DIAGNOSIS — D701 Agranulocytosis secondary to cancer chemotherapy: Secondary | ICD-10-CM | POA: Diagnosis not present

## 2020-12-05 DIAGNOSIS — Z510 Encounter for antineoplastic radiation therapy: Secondary | ICD-10-CM | POA: Diagnosis not present

## 2020-12-05 DIAGNOSIS — Z5111 Encounter for antineoplastic chemotherapy: Secondary | ICD-10-CM | POA: Diagnosis not present

## 2020-12-05 DIAGNOSIS — E876 Hypokalemia: Secondary | ICD-10-CM | POA: Diagnosis not present

## 2020-12-05 DIAGNOSIS — T451X5A Adverse effect of antineoplastic and immunosuppressive drugs, initial encounter: Secondary | ICD-10-CM

## 2020-12-05 DIAGNOSIS — C3432 Malignant neoplasm of lower lobe, left bronchus or lung: Secondary | ICD-10-CM

## 2020-12-05 DIAGNOSIS — Z5189 Encounter for other specified aftercare: Secondary | ICD-10-CM | POA: Diagnosis not present

## 2020-12-05 DIAGNOSIS — I10 Essential (primary) hypertension: Secondary | ICD-10-CM

## 2020-12-05 DIAGNOSIS — C771 Secondary and unspecified malignant neoplasm of intrathoracic lymph nodes: Secondary | ICD-10-CM | POA: Diagnosis not present

## 2020-12-05 LAB — CBC WITH DIFFERENTIAL (CANCER CENTER ONLY)
Abs Immature Granulocytes: 0 10*3/uL (ref 0.00–0.07)
Basophils Absolute: 0 10*3/uL (ref 0.0–0.1)
Basophils Relative: 1 %
Eosinophils Absolute: 0 10*3/uL (ref 0.0–0.5)
Eosinophils Relative: 1 %
HCT: 25.3 % — ABNORMAL LOW (ref 36.0–46.0)
HCT: 25.7 % — ABNORMAL LOW (ref 36.0–46.0)
Hemoglobin: 8.9 g/dL — ABNORMAL LOW (ref 12.0–15.0)
Hemoglobin: 8.9 g/dL — ABNORMAL LOW (ref 12.0–15.0)
Immature Granulocytes: 0 %
Lymphocytes Relative: 59 %
Lymphs Abs: 0.6 10*3/uL — ABNORMAL LOW (ref 0.7–4.0)
MCH: 35.2 pg — ABNORMAL HIGH (ref 26.0–34.0)
MCH: 35.2 pg — ABNORMAL HIGH (ref 26.0–34.0)
MCHC: 34.6 g/dL (ref 30.0–36.0)
MCHC: 35.2 g/dL (ref 30.0–36.0)
MCV: 100 fL (ref 80.0–100.0)
MCV: 101.6 fL — ABNORMAL HIGH (ref 80.0–100.0)
Monocytes Absolute: 0.1 10*3/uL (ref 0.1–1.0)
Monocytes Relative: 12 %
Neutro Abs: 0.3 10*3/uL — CL (ref 1.7–7.7)
Neutrophils Relative %: 27 %
Platelet Count: 105 10*3/uL — ABNORMAL LOW (ref 150–400)
Platelet Count: 106 10*3/uL — ABNORMAL LOW (ref 150–400)
RBC: 2.53 MIL/uL — ABNORMAL LOW (ref 3.87–5.11)
RBC: 2.53 MIL/uL — ABNORMAL LOW (ref 3.87–5.11)
RDW: 13.4 % (ref 11.5–15.5)
RDW: 13.5 % (ref 11.5–15.5)
Smear Review: ADEQUATE
WBC Count: 1 10*3/uL — ABNORMAL LOW (ref 4.0–10.5)
WBC Count: 1 10*3/uL — ABNORMAL LOW (ref 4.0–10.5)
nRBC: 0 % (ref 0.0–0.2)
nRBC: 0 % (ref 0.0–0.2)

## 2020-12-05 LAB — CMP (CANCER CENTER ONLY)
ALT: 12 U/L (ref 0–44)
AST: 16 U/L (ref 15–41)
Albumin: 3.6 g/dL (ref 3.5–5.0)
Alkaline Phosphatase: 97 U/L (ref 38–126)
Anion gap: 9 (ref 5–15)
BUN: 17 mg/dL (ref 8–23)
CO2: 28 mmol/L (ref 22–32)
Calcium: 8.6 mg/dL — ABNORMAL LOW (ref 8.9–10.3)
Chloride: 103 mmol/L (ref 98–111)
Creatinine: 0.88 mg/dL (ref 0.44–1.00)
GFR, Estimated: >60 mL/min (ref >60)
Glucose, Bld: 86 mg/dL (ref 70–99)
Potassium: 4 mmol/L (ref 3.5–5.1)
Sodium: 140 mmol/L (ref 135–145)
Total Bilirubin: 0.2 mg/dL — ABNORMAL LOW (ref 0.3–1.2)
Total Protein: 6.4 g/dL — ABNORMAL LOW (ref 6.5–8.1)

## 2020-12-05 LAB — MAGNESIUM: Magnesium: 1.6 mg/dL — ABNORMAL LOW (ref 1.7–2.4)

## 2020-12-05 MED ORDER — FILGRASTIM-AAFI 300 MCG/0.5ML IJ SOSY
300.0000 ug | PREFILLED_SYRINGE | Freq: Once | INTRAMUSCULAR | Status: AC
  Administered 2020-12-05: 16:00:00 300 ug via SUBCUTANEOUS

## 2020-12-05 MED ORDER — SUCRALFATE 1 GM/10ML PO SUSP
ORAL | 1 refills | Status: DC
  Filled 2020-12-05 – 2020-12-06 (×2): qty 1200, 30d supply, fill #0

## 2020-12-05 NOTE — Telephone Encounter (Signed)
Pt called " I feel like crap".  Could not get breath on Friday -used inhalers a lot . Breathing better today . Light headed. Feels weak.  She is eating and drinking now after completing nystatin .  Asking "What were my lab results  last week? I called her with results. She has been  taking mag oxide 400 mg /day.  Appt today @ 2pm for weekly labs then XRT.  I called pt and she wants to see what are the lab results today and then will let me know if she wants to see a provider today [facility].

## 2020-12-05 NOTE — Progress Notes (Signed)
The patient's pharmacy does not have stock of Carafate and I called [facility] and they have suspension available and a new prescription was given as a verbal for the suspension.

## 2020-12-05 NOTE — Progress Notes (Signed)
Cardiology Office Note Date:  12/06/2020  Patient ID:  Patricia [name], Patricia [name] 1950-12-03, MRN [id number] PCP:  [name], MD  Cardiologist/Electrophysiologist: Dr. Curt Bears    Chief Complaint: post ER visit  History of Present Illness: Cheril Slattery is a 70 y.o. female with history of HTN, HLD, TIA, IBS, SVT, AFib, chronic back pain, lung Ca (non-operable)  She was referred to Dr. Curt Bears June 2020 for an episode of SVT.  She had worn a monitor noting SVT, no AFib (pending final report), planned to discuss case with neurology perhaps consider linq.  The patient wanted to avoid medicines/procedures, was instructed on vagal maneuvers. Final monitor report: Zero atrial fibrillation Sinus rhythm with episodes of paroxysmal SVT   Nov 2021, called with palpitations recommended 2 week monitoring, mentioned issues with pain as well and planned to f/u with her surgeon, thinking perhaps triggered by pain. 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 Date of surgery November 20, 2019)  12/14/19 had an ER visit brought via EMS, reportedly treated by EMS succesfully with adenosine Reported historically vagal maneuvers, cold rag on her face would resolve her palpitations until then. (ECG images noted in ED note) Was wearing long term monitor. She was prescribed lopressor 25mg  BID and discharged from the ER  I saw her Dec 2021 She is feeling well outside of her SVT and R wrist. She denies any symptoms of CP, SOB, DOE, She is limited with her are casted currently otherwise denies any limitations or concerns. No dizzy spells, near syncope or syncope. She had not had any SVT since her last visit, thinks her fall, wrist fracture has triggered some palpitations. The day she called EMS was lasting hours, began to feel weak, lightheaded, numb in her hands, feet and was quite  scary. As soon as they stopped it she felt better. She is tolerating the metoprolol and has not had further palpitations. She mailed the monitor back in yesterday She has been a week without smoking!  Discussed treatment strategies, she preferred to avoid procedures, planned to continue BB, titrate if needed, consider AAD if more   10/01/20 hospitalized with syncope, she was ill with recurrent loose stools for days more then her usual, she was hyponatremic, orthostatic vitals inconsistent. She did have SVT (probably an ATach) though syncope felt 2/2 hypovolemia. 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  She had a biopsy of her cancer at that time.  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  She has been since been started on chemotherapy  She saw Dr. Curt Bears in f/u Oct 27/22, had some brief/mild palpitations, tolerating chemo, no changes were made.  ER visit 11/11/20 with palpitations, tachycardia, EKG was ST 104bpm, was hypokalemic (3.4) (noted by her oncologist as well) and low mag (1.2), started on K+ supplementation, developed Afib w/RVR and given IV lopressor x2 and discharged from the ER back in SR  Saw Dr. Valeta Harms 11/16/20, noted she was planned for XRT with oncology.  Suspected component of COPD and started on maintenance inhaler  Called 11/11 with reports of elevated HRs given an appt 11/29  TODAY She has had several episodes of racing heart rates associated with marked weakness and near syncope.  She had one this AM, they last 20-30 min, she has to lay or sit down immediately She denies CP, has not fainted  She in general has had a  mild increase in her SOB with the start of her chemo/XRT.  When not in RVR/her tachycardia she denies any near syncope or syncope. No obvious bleeding, CBC/counts drifting downward, no one has asked her to stop her Eliquis She has been started on something she says to help with her  neutrophils    Past Medical History:  Diagnosis Date   Anxiety    GERD (gastroesophageal reflux disease)    Hyperlipidemia    Hypertension    IBS (irritable bowel syndrome)    Insomnia    Low back pain    Lung cancer ([place]) 10/2020   TIA (transient ischemic attack) 10/2017   no deficits   Vitamin B12 deficiency    Vitamin D deficiency     Past Surgical History:  Procedure Laterality Date   BACK SURGERY     BREAST EXCISIONAL BIOPSY Left 1997   benign   BRONCHIAL BIOPSY  10/18/2020   Procedure: BRONCHIAL BIOPSIES;  Surgeon: [name], DO;  Location: [facility];  Service: Pulmonary;;   BRONCHIAL BRUSHINGS  10/18/2020   Procedure: BRONCHIAL BRUSHINGS;  Surgeon: [name], DO;  Location: [facility];  Service: Pulmonary;;   BRONCHIAL NEEDLE ASPIRATION BIOPSY  10/18/2020   Procedure: BRONCHIAL NEEDLE ASPIRATION BIOPSIES;  Surgeon: [name], DO;  Location: [facility];  Service: Pulmonary;;   BRONCHIAL WASHINGS  10/18/2020   Procedure: BRONCHIAL WASHINGS;  Surgeon: [name], DO;  Location: [facility];  Service: Pulmonary;;   hemorrhoidecotmy     HERNIA MESH REMOVAL     OPEN REDUCTION INTERNAL FIXATION (ORIF) DISTAL RADIAL FRACTURE Right 11/19/2019   Procedure: OPEN REDUCTION INTERNAL FIXATION (ORIF) DISTAL RADIUS AND ULNA FRACTURE WITH REPAIR AS NECESSARY;  Surgeon: Roseanne Kaufman, MD;  Location: [facility];  Service: Orthopedics;  Laterality: Right;  2 hrs Block with IV sedation   ORIF RADIUS & ULNA FRACTURES     TONSILLECTOMY     VIDEO BRONCHOSCOPY WITH ENDOBRONCHIAL NAVIGATION Left 10/18/2020   Procedure: VIDEO BRONCHOSCOPY WITH ENDOBRONCHIAL NAVIGATION;  Surgeon: [name], DO;  Location: [facility];  Service: Pulmonary;  Laterality: Left;  ION   VIDEO BRONCHOSCOPY WITH ENDOBRONCHIAL ULTRASOUND Bilateral 10/18/2020   Procedure: VIDEO BRONCHOSCOPY WITH ENDOBRONCHIAL ULTRASOUND;  Surgeon: [name], DO;  Location: [facility];   Service: Pulmonary;  Laterality: Bilateral;   VIDEO BRONCHOSCOPY WITH RADIAL ENDOBRONCHIAL ULTRASOUND  10/18/2020   Procedure: VIDEO BRONCHOSCOPY WITH RADIAL ENDOBRONCHIAL ULTRASOUND;  Surgeon: [name], DO;  Location: [facility];  Service: Pulmonary;;    Current Outpatient Medications  Medication Sig Dispense Refill   acetaminophen (TYLENOL) 325 MG tablet 1 tablet as needed.     budesonide-formoterol (SYMBICORT) 80-4.5 MCG/ACT inhaler Take 2 puffs first thing in am and then another 2 puffs about 12 hours later. 1 each 12   carboxymethylcellulose (REFRESH PLUS) 0.5 % SOLN Place 1 drop into both eyes 3 (three) times daily as needed (for dryness).      carisoprodol (SOMA) 350 MG tablet Take 350 mg by mouth 3 (three) times daily.   5   Cholecalciferol (VITAMIN D3) 50 MCG (2000 UT) TABS Take 2,000 Units by mouth daily.     clobetasol ointment (TEMOVATE) 5.62 % Apply 1 application topically See admin instructions. Apply to vaginal area daily as directed     clonazePAM (KLONOPIN) 1 MG tablet Take 1 mg by mouth at bedtime.   3   ELIQUIS 5 MG TABS tablet TAKE 1 TABLET(5  MG) BY MOUTH TWICE DAILY 60 tablet 6   flecainide (TAMBOCOR) 50 MG tablet Take 1 tablet (50 mg total) by mouth 2 (two) times daily. 180 tablet 3   fluticasone (FLONASE) 50 MCG/ACT nasal spray Place 2 sprays into both nostrils daily as needed for allergies or rhinitis.   5   furosemide (LASIX) 20 MG tablet Take 1 tablet (20 mg total) by mouth daily. 30 tablet 2   gabapentin (NEURONTIN) 600 MG tablet Take 600 mg by mouth See admin instructions. Take 600 mg by mouth in the morning and at lunchtime     hyoscyamine (LEVSIN) 0.125 MG tablet Take 0.125 mg by mouth as needed.     lidocaine (XYLOCAINE) 2 % solution Use as directed 15 mLs in the mouth or throat every 6 (six) hours as needed for mouth pain. 100 mL 0   linaclotide (LINZESS) 145 MCG CAPS capsule Take 1 capsule (145 mcg total) by mouth daily at 4 PM. 30 capsule 0   magic  mouthwash SOLN Take 5 mLs by mouth 4 (four) times daily as needed for mouth pain. Pt is allergic to Magnesium 240 mL 1   magnesium oxide (MAG-OX) 400 (240 Mg) MG tablet Take 1 tablet (400 mg total) by mouth daily. 30 tablet 1   metoprolol tartrate (LOPRESSOR) 50 MG tablet Take 1 tablet (50 mg total) by mouth 2 (two) times daily. 60 tablet 0   nystatin (MYCOSTATIN) 100000 UNIT/ML suspension Take 5 mLs (500,000 Units total) by mouth 4 (four) times daily. 60 mL 0   omeprazole (PRILOSEC) 20 MG capsule Take 20 mg by mouth daily before breakfast.      ondansetron (ZOFRAN-ODT) 8 MG disintegrating tablet Take 8 mg by mouth 3 (three) times daily.     polyethylene glycol (MIRALAX / GLYCOLAX) packet Take 34 g by mouth in the morning.      potassium chloride SA (KLOR-CON) 20 MEQ tablet Take 1 tablet (20 mEq total) by mouth daily. 5 tablet 0   prochlorperazine (COMPAZINE) 10 MG tablet Take 1 tablet (10 mg total) by mouth every 6 (six) hours as needed for nausea or vomiting. 30 tablet 0   rosuvastatin (CRESTOR) 5 MG tablet Take 5 mg by mouth 3 (three) times a week. Mondays Wednesdays Fridays     sucralfate (CARAFATE) 1 GM/10ML suspension Take 10 ml by mouth 4 times a day.  (Before meals & at bedtime) 1200 mL 1   Tiotropium Bromide-Olodaterol (STIOLTO RESPIMAT) 2.5-2.5 MCG/ACT AERS Inhale 2 puffs into the lungs daily. 4 g 0   Tiotropium Bromide-Olodaterol 2.5-2.5 MCG/ACT AERS Inhale 2 puffs into the lungs daily. 4 g 5   traMADol (ULTRAM) 50 MG tablet Take 100 mg by mouth as needed.     VENTOLIN HFA 108 (90 Base) MCG/ACT inhaler Inhale 2 puffs into the lungs every 6 (six) hours as needed for wheezing or shortness of breath.   5   zaleplon (SONATA) 10 MG capsule Take 10 mg by mouth at bedtime as needed (for interrupted sleep).      No current facility-administered medications for this visit.    Allergies:   Augmentin [amoxicillin-pot clavulanate], Levaquin [levofloxacin in d5w], Mom [magnesium hydroxide], and  Oxycodone   Social History:  The patient  reports that she quit smoking about a year ago. Her smoking use included cigarettes. She started smoking about 50 years ago. She has a 50.00 pack-year smoking history. She has never used smokeless tobacco. She reports that she does not currently use alcohol. She  reports that she does not use drugs.   Family History:  The patient's family history includes Breast cancer in her maternal aunt, paternal aunt, and sister.  ROS:  Please see the history of present illness.    All other systems are reviewed and otherwise negative.   PHYSICAL EXAM:  VS:  BP 96/60   Pulse 75   Ht 5\' 6"  (1.676 m)   Wt 184 lb 6.4 oz (83.6 kg)   LMP  (LMP Unknown)   SpO2 92%   BMI 29.76 kg/m  BMI: Body mass index is 29.76 kg/m. Well nourished, well developed, in no acute distress HEENT: normocephalic, atraumatic Neck: no JVD, carotid bruits or masses Cardiac:  RRR; no significant murmurs, no rubs, or gallops Lungs:  CTA b/l, no wheezing, rhonchi or rales Abd: soft, nontender MS: no deformity or atrophy Ext: no edema Skin: warm and dry, no rash Neuro:  No gross deficits appreciated Psych: euthymic mood, full affect     EKG:  done today and reviewed by myself SR 75bpm, PVC, PR 18ms, QRS 35ms, QTc 449ms    10/01/20: TTE  1. Left ventricular ejection fraction, by estimation, is 60 to 65%. The  left ventricle has normal function. The left ventricle has no regional  wall motion abnormalities. Left ventricular diastolic parameters are  consistent with Grade I diastolic dysfunction (impaired relaxation).   2. Right ventricular systolic function is normal. The right ventricular  size is normal.   3. Prominent epicardial fat.   4. The mitral valve is normal in structure. No evidence of mitral valve  regurgitation. No evidence of mitral stenosis.   5. The aortic valve was not well visualized. There is mild calcification  of the aortic valve. Aortic valve regurgitation  is not visualized. Mild  aortic valve sclerosis is present, with no evidence of aortic valve  stenosis.   6. The inferior vena cava is normal in size with <50% respiratory  variability, suggesting right atrial pressure of 8 mmHg.    2 weeks event monitor 12/08/19: Max 234 bpm 02:47pm, 12/01 Min 46 bpm 11:11pm, 12/07 Avg 76 bpm Less than 1% ventricular and supraventricular ectopy The predominant underlying rhythm was sinus rhythm 2% atrial fibrillation burden with heart rates of 107 to 234 bpm Triggered events associated with both atrial fibrillation and SVT.   TTE 10/25/17 - Left ventricle: The cavity size was normal. Systolic function was   normal. The estimated ejection fraction was in the range of 55%   to 60%. Wall motion was normal; there were no regional wall   motion abnormalities. Doppler parameters are consistent with   abnormal left ventricular relaxation (grade 1 diastolic   dysfunction). There was no evidence of elevated ventricular   filling pressure by Doppler parameters. - Aortic valve: There was no regurgitation. - Mitral valve: There was trivial regurgitation. - Right ventricle: The cavity size was normal. Wall thickness was   normal. Systolic function was normal. - Tricuspid valve: There was mild regurgitation. - Pulmonary arteries: Systolic pressure was within the normal   range. - Inferior vena cava: The vessel was normal in size. - Pericardium, extracardiac: There was no pericardial effusion.   Cardiac monitor 06/12/2018  Sinus rhythm with episodes of SVT  Recent Labs: 10/14/2020: TSH 2.021 12/05/2020: ALT 12; BUN 17; Creatinine 0.88; Hemoglobin 8.9; Magnesium 1.6; Platelet Count 106; Potassium 4.0; Sodium 140  No results found for requested labs within last 8760 hours.   Estimated Creatinine Clearance: 63.9 mL/min (by C-G formula  based on SCr of 0.88 mg/dL).   Wt Readings from Last 3 Encounters:  12/06/20 184 lb 6.4 oz (83.6 kg)  11/21/20 191 lb 1.6  oz (86.7 kg)  11/16/20 184 lb 9.6 oz (83.7 kg)     Other studies reviewed: Additional studies/records reviewed today include: summarized above  ASSESSMENT AND PLAN:  1. SVT Likely an ATach 2. Paroxysmal AFib CHA2DS2Vasc is 5, on Eliquis, appropriately dosed   Acute up tick in palpitations, reports rates 140's and very symptomatic BP will not allow more nodal blocking agents, hesitate to advance BB with increased COPD concerns of late D/w Dr. Curt Bears She has no hx of CAD, LVEF wnl, add Flecainide 50mg  BID EKG within the week and I will see her back in 2 weeks    2. HTN     Relative hypotension, she reports better readings then we see here      Disposition: as above   Current medicines are reviewed at length with the patient today.  The patient did not have any concerns regarding medicines.  Venetia Night, PA-C 12/06/2020 12:30 PM     [place] [address] [phone] (office)  [phone] (fax)

## 2020-12-06 ENCOUNTER — Inpatient Hospital Stay: Payer: Medicare HMO

## 2020-12-06 ENCOUNTER — Encounter: Payer: Self-pay | Admitting: Physician Assistant

## 2020-12-06 ENCOUNTER — Ambulatory Visit: Payer: Medicare HMO | Admitting: Physician Assistant

## 2020-12-06 ENCOUNTER — Ambulatory Visit
Admission: RE | Admit: 2020-12-06 | Discharge: 2020-12-06 | Disposition: A | Discharge status: Home / Self Care | Payer: Medicare HMO | Source: Ambulatory Visit | Attending: Radiation Oncology | Admitting: Radiation Oncology

## 2020-12-06 ENCOUNTER — Other Ambulatory Visit (HOSPITAL_COMMUNITY): Payer: Self-pay

## 2020-12-06 VITALS — BP 103/58 | HR 81 | Temp 98.3°F | Resp 18

## 2020-12-06 VITALS — BP 96/60 | HR 75 | Ht 66.0 in | Wt 184.4 lb

## 2020-12-06 DIAGNOSIS — I471 Supraventricular tachycardia: Secondary | ICD-10-CM | POA: Diagnosis not present

## 2020-12-06 DIAGNOSIS — I480 Paroxysmal atrial fibrillation: Secondary | ICD-10-CM

## 2020-12-06 DIAGNOSIS — C3432 Malignant neoplasm of lower lobe, left bronchus or lung: Secondary | ICD-10-CM | POA: Diagnosis not present

## 2020-12-06 DIAGNOSIS — D701 Agranulocytosis secondary to cancer chemotherapy: Secondary | ICD-10-CM | POA: Diagnosis not present

## 2020-12-06 DIAGNOSIS — Z5189 Encounter for other specified aftercare: Secondary | ICD-10-CM | POA: Diagnosis not present

## 2020-12-06 DIAGNOSIS — E876 Hypokalemia: Secondary | ICD-10-CM | POA: Diagnosis not present

## 2020-12-06 DIAGNOSIS — Z5111 Encounter for antineoplastic chemotherapy: Secondary | ICD-10-CM | POA: Diagnosis not present

## 2020-12-06 DIAGNOSIS — T451X5A Adverse effect of antineoplastic and immunosuppressive drugs, initial encounter: Secondary | ICD-10-CM | POA: Diagnosis not present

## 2020-12-06 DIAGNOSIS — C771 Secondary and unspecified malignant neoplasm of intrathoracic lymph nodes: Secondary | ICD-10-CM | POA: Diagnosis not present

## 2020-12-06 DIAGNOSIS — Z510 Encounter for antineoplastic radiation therapy: Secondary | ICD-10-CM | POA: Diagnosis not present

## 2020-12-06 DIAGNOSIS — I10 Essential (primary) hypertension: Secondary | ICD-10-CM | POA: Diagnosis not present

## 2020-12-06 MED ORDER — FILGRASTIM-AAFI 300 MCG/0.5ML IJ SOSY
300.0000 ug | PREFILLED_SYRINGE | Freq: Once | INTRAMUSCULAR | Status: AC
  Administered 2020-12-06: 300 ug via SUBCUTANEOUS

## 2020-12-06 MED ORDER — FLECAINIDE ACETATE 50 MG PO TABS: 50.0000 mg | ORAL_TABLET | Freq: Two times a day (BID) | ORAL | 3 refills | Status: DC

## 2020-12-06 NOTE — Patient Instructions (Addendum)
Medication Instructions:   START TAKING FLECAINIDE  50 MG TWICE A DAY   *If you need a refill on your cardiac medications before your next appointment, please call your pharmacy*   Lab Work: NONE ORDERED  TODAY    If you have labs (blood work) drawn today and your tests are completely normal, you will receive your results only by: [place] (if you have MyChart) OR A paper copy in the mail If you have any lab test that is abnormal or we need to change your treatment, we will call you to review the results.   Testing/Procedures: NONE ORDERED  TODAY    Follow-Up: At [facility], you and your health needs are our priority.  As part of our continuing mission to provide you with exceptional heart care, we have created designated Provider Care Teams.  These Care Teams include your primary Cardiologist (physician) and Advanced Practice Providers (APPs -  Physician Assistants and Nurse Practitioners) who all work together to provide you with the care you need, when you need it.  We recommend signing up for the patient portal called "MyChart".  Sign up information is provided on this After Visit Summary.  MyChart is used to connect with patients for Virtual Visits (Telemedicine).  Patients are able to view lab/test results, encounter notes, upcoming appointments, etc.  Non-urgent messages can be sent to your provider as well.   To learn more about what you can do with MyChart, go to [email or web address].    Your next appointment:    1 week(s) Nurse visit with EKG (new med start Flecanide)  And   2 weeks   The format for your next appointment:   In Person  Provider:   You will see one of the following Advanced Practice Providers on your designated Care Team:   Tommye Standard, [place]      Other Instructions

## 2020-12-07 ENCOUNTER — Other Ambulatory Visit: Payer: Self-pay

## 2020-12-07 ENCOUNTER — Inpatient Hospital Stay: Payer: Medicare HMO

## 2020-12-07 ENCOUNTER — Ambulatory Visit
Admission: RE | Admit: 2020-12-07 | Discharge: 2020-12-07 | Disposition: A | Discharge status: Home / Self Care | Payer: Medicare HMO | Source: Ambulatory Visit | Attending: Radiation Oncology | Admitting: Radiation Oncology

## 2020-12-07 DIAGNOSIS — T451X5A Adverse effect of antineoplastic and immunosuppressive drugs, initial encounter: Secondary | ICD-10-CM | POA: Diagnosis not present

## 2020-12-07 DIAGNOSIS — Z5111 Encounter for antineoplastic chemotherapy: Secondary | ICD-10-CM | POA: Diagnosis not present

## 2020-12-07 DIAGNOSIS — Z510 Encounter for antineoplastic radiation therapy: Secondary | ICD-10-CM | POA: Diagnosis not present

## 2020-12-07 DIAGNOSIS — C3432 Malignant neoplasm of lower lobe, left bronchus or lung: Secondary | ICD-10-CM | POA: Diagnosis not present

## 2020-12-07 DIAGNOSIS — C771 Secondary and unspecified malignant neoplasm of intrathoracic lymph nodes: Secondary | ICD-10-CM | POA: Diagnosis not present

## 2020-12-07 DIAGNOSIS — D701 Agranulocytosis secondary to cancer chemotherapy: Secondary | ICD-10-CM | POA: Diagnosis not present

## 2020-12-07 DIAGNOSIS — Z5189 Encounter for other specified aftercare: Secondary | ICD-10-CM | POA: Diagnosis not present

## 2020-12-07 DIAGNOSIS — E876 Hypokalemia: Secondary | ICD-10-CM | POA: Diagnosis not present

## 2020-12-07 MED ORDER — FILGRASTIM-AAFI 300 MCG/0.5ML IJ SOSY
300.0000 ug | PREFILLED_SYRINGE | Freq: Once | INTRAMUSCULAR | Status: AC
  Administered 2020-12-07: 300 ug via SUBCUTANEOUS
  Filled 2020-12-07: qty 0.5

## 2020-12-08 ENCOUNTER — Ambulatory Visit (HOSPITAL_COMMUNITY)
Admission: RE | Admit: 2020-12-08 | Discharge: 2020-12-08 | Disposition: A | Discharge status: Home / Self Care | Payer: Medicare HMO | Source: Ambulatory Visit | Attending: Physician Assistant | Admitting: Physician Assistant

## 2020-12-08 ENCOUNTER — Ambulatory Visit
Admission: RE | Admit: 2020-12-08 | Discharge: 2020-12-08 | Disposition: A | Discharge status: Home / Self Care | Payer: Medicare HMO | Source: Ambulatory Visit | Attending: Radiation Oncology | Admitting: Radiation Oncology

## 2020-12-08 DIAGNOSIS — E876 Hypokalemia: Secondary | ICD-10-CM | POA: Insufficient documentation

## 2020-12-08 DIAGNOSIS — Z5189 Encounter for other specified aftercare: Secondary | ICD-10-CM | POA: Insufficient documentation

## 2020-12-08 DIAGNOSIS — Z510 Encounter for antineoplastic radiation therapy: Secondary | ICD-10-CM | POA: Insufficient documentation

## 2020-12-08 DIAGNOSIS — T451X5A Adverse effect of antineoplastic and immunosuppressive drugs, initial encounter: Secondary | ICD-10-CM | POA: Insufficient documentation

## 2020-12-08 DIAGNOSIS — D701 Agranulocytosis secondary to cancer chemotherapy: Secondary | ICD-10-CM | POA: Insufficient documentation

## 2020-12-08 DIAGNOSIS — H9193 Unspecified hearing loss, bilateral: Secondary | ICD-10-CM | POA: Insufficient documentation

## 2020-12-08 DIAGNOSIS — J9811 Atelectasis: Secondary | ICD-10-CM | POA: Diagnosis not present

## 2020-12-08 DIAGNOSIS — D649 Anemia, unspecified: Secondary | ICD-10-CM | POA: Diagnosis not present

## 2020-12-08 DIAGNOSIS — C771 Secondary and unspecified malignant neoplasm of intrathoracic lymph nodes: Secondary | ICD-10-CM | POA: Insufficient documentation

## 2020-12-08 DIAGNOSIS — R911 Solitary pulmonary nodule: Secondary | ICD-10-CM | POA: Diagnosis not present

## 2020-12-08 DIAGNOSIS — C3490 Malignant neoplasm of unspecified part of unspecified bronchus or lung: Secondary | ICD-10-CM | POA: Diagnosis not present

## 2020-12-08 DIAGNOSIS — C3432 Malignant neoplasm of lower lobe, left bronchus or lung: Secondary | ICD-10-CM | POA: Insufficient documentation

## 2020-12-08 DIAGNOSIS — Z5111 Encounter for antineoplastic chemotherapy: Secondary | ICD-10-CM | POA: Insufficient documentation

## 2020-12-08 DIAGNOSIS — J439 Emphysema, unspecified: Secondary | ICD-10-CM | POA: Diagnosis not present

## 2020-12-08 DIAGNOSIS — J90 Pleural effusion, not elsewhere classified: Secondary | ICD-10-CM | POA: Diagnosis not present

## 2020-12-08 DIAGNOSIS — Z79899 Other long term (current) drug therapy: Secondary | ICD-10-CM | POA: Diagnosis not present

## 2020-12-08 DIAGNOSIS — I700 Atherosclerosis of aorta: Secondary | ICD-10-CM | POA: Diagnosis not present

## 2020-12-08 MED ORDER — IOHEXOL 350 MG/ML SOLN
60.0000 mL | Freq: Once | INTRAVENOUS | Status: AC | PRN
  Administered 2020-12-08: 60 mL via INTRAVENOUS

## 2020-12-09 ENCOUNTER — Other Ambulatory Visit: Payer: Self-pay

## 2020-12-09 ENCOUNTER — Other Ambulatory Visit: Payer: Self-pay | Admitting: Radiation Oncology

## 2020-12-09 ENCOUNTER — Ambulatory Visit
Admission: RE | Admit: 2020-12-09 | Discharge: 2020-12-09 | Disposition: A | Discharge status: Home / Self Care | Payer: Medicare HMO | Source: Ambulatory Visit | Attending: Radiation Oncology | Admitting: Radiation Oncology

## 2020-12-09 DIAGNOSIS — C771 Secondary and unspecified malignant neoplasm of intrathoracic lymph nodes: Secondary | ICD-10-CM | POA: Diagnosis not present

## 2020-12-09 DIAGNOSIS — E876 Hypokalemia: Secondary | ICD-10-CM | POA: Diagnosis not present

## 2020-12-09 DIAGNOSIS — Z510 Encounter for antineoplastic radiation therapy: Secondary | ICD-10-CM | POA: Diagnosis not present

## 2020-12-09 DIAGNOSIS — D649 Anemia, unspecified: Secondary | ICD-10-CM | POA: Diagnosis not present

## 2020-12-09 DIAGNOSIS — C3432 Malignant neoplasm of lower lobe, left bronchus or lung: Secondary | ICD-10-CM | POA: Diagnosis not present

## 2020-12-09 DIAGNOSIS — D701 Agranulocytosis secondary to cancer chemotherapy: Secondary | ICD-10-CM | POA: Diagnosis not present

## 2020-12-09 DIAGNOSIS — Z79899 Other long term (current) drug therapy: Secondary | ICD-10-CM | POA: Diagnosis not present

## 2020-12-09 DIAGNOSIS — Z5111 Encounter for antineoplastic chemotherapy: Secondary | ICD-10-CM | POA: Diagnosis not present

## 2020-12-09 MED ORDER — SUCRALFATE 1 G PO TABS: 1.0000 g | ORAL_TABLET | Freq: Four times a day (QID) | ORAL | 2 refills | Status: DC

## 2020-12-12 ENCOUNTER — Inpatient Hospital Stay (HOSPITAL_BASED_OUTPATIENT_CLINIC_OR_DEPARTMENT_OTHER): Payer: Medicare HMO | Admitting: Internal Medicine

## 2020-12-12 ENCOUNTER — Inpatient Hospital Stay: Payer: Medicare HMO

## 2020-12-12 ENCOUNTER — Inpatient Hospital Stay: Payer: Medicare HMO | Attending: Internal Medicine

## 2020-12-12 ENCOUNTER — Ambulatory Visit
Admission: RE | Admit: 2020-12-12 | Discharge: 2020-12-12 | Disposition: A | Discharge status: Home / Self Care | Payer: Medicare HMO | Source: Ambulatory Visit | Attending: Radiation Oncology | Admitting: Radiation Oncology

## 2020-12-12 ENCOUNTER — Encounter: Payer: Self-pay | Admitting: Internal Medicine

## 2020-12-12 ENCOUNTER — Other Ambulatory Visit: Payer: Self-pay

## 2020-12-12 VITALS — BP 136/72 | HR 75 | Temp 97.0°F | Resp 20 | Ht 66.0 in | Wt 186.5 lb

## 2020-12-12 DIAGNOSIS — Z510 Encounter for antineoplastic radiation therapy: Secondary | ICD-10-CM | POA: Diagnosis not present

## 2020-12-12 DIAGNOSIS — Z79899 Other long term (current) drug therapy: Secondary | ICD-10-CM | POA: Diagnosis not present

## 2020-12-12 DIAGNOSIS — Z5189 Encounter for other specified aftercare: Secondary | ICD-10-CM | POA: Insufficient documentation

## 2020-12-12 DIAGNOSIS — D701 Agranulocytosis secondary to cancer chemotherapy: Secondary | ICD-10-CM

## 2020-12-12 DIAGNOSIS — E876 Hypokalemia: Secondary | ICD-10-CM | POA: Diagnosis not present

## 2020-12-12 DIAGNOSIS — D649 Anemia, unspecified: Secondary | ICD-10-CM | POA: Diagnosis not present

## 2020-12-12 DIAGNOSIS — Z5111 Encounter for antineoplastic chemotherapy: Secondary | ICD-10-CM

## 2020-12-12 DIAGNOSIS — C3432 Malignant neoplasm of lower lobe, left bronchus or lung: Secondary | ICD-10-CM

## 2020-12-12 DIAGNOSIS — T451X5A Adverse effect of antineoplastic and immunosuppressive drugs, initial encounter: Secondary | ICD-10-CM | POA: Insufficient documentation

## 2020-12-12 DIAGNOSIS — C771 Secondary and unspecified malignant neoplasm of intrathoracic lymph nodes: Secondary | ICD-10-CM | POA: Diagnosis not present

## 2020-12-12 LAB — CBC WITH DIFFERENTIAL (CANCER CENTER ONLY)
Abs Immature Granulocytes: 0.59 10*3/uL — ABNORMAL HIGH (ref 0.00–0.07)
Basophils Absolute: 0.1 10*3/uL (ref 0.0–0.1)
Basophils Relative: 1 %
Eosinophils Absolute: 0 10*3/uL (ref 0.0–0.5)
Eosinophils Relative: 0 %
HCT: 25.1 % — ABNORMAL LOW (ref 36.0–46.0)
Hemoglobin: 8.7 g/dL — ABNORMAL LOW (ref 12.0–15.0)
Immature Granulocytes: 13 %
Lymphocytes Relative: 13 %
Lymphs Abs: 0.6 10*3/uL — ABNORMAL LOW (ref 0.7–4.0)
MCH: 35.2 pg — ABNORMAL HIGH (ref 26.0–34.0)
MCHC: 34.7 g/dL (ref 30.0–36.0)
MCV: 101.6 fL — ABNORMAL HIGH (ref 80.0–100.0)
Monocytes Absolute: 0.8 10*3/uL (ref 0.1–1.0)
Monocytes Relative: 18 %
Neutro Abs: 2.5 10*3/uL (ref 1.7–7.7)
Neutrophils Relative %: 55 %
Platelet Count: 206 10*3/uL (ref 150–400)
RBC: 2.47 MIL/uL — ABNORMAL LOW (ref 3.87–5.11)
RDW: 15.4 % (ref 11.5–15.5)
WBC Count: 4.5 10*3/uL (ref 4.0–10.5)
nRBC: 0.7 % — ABNORMAL HIGH (ref 0.0–0.2)

## 2020-12-12 LAB — CMP (CANCER CENTER ONLY)
ALT: 26 U/L (ref 0–44)
AST: 24 U/L (ref 15–41)
Albumin: 3.4 g/dL — ABNORMAL LOW (ref 3.5–5.0)
Alkaline Phosphatase: 106 U/L (ref 38–126)
Anion gap: 10 (ref 5–15)
BUN: 13 mg/dL (ref 8–23)
CO2: 25 mmol/L (ref 22–32)
Calcium: 8.2 mg/dL — ABNORMAL LOW (ref 8.9–10.3)
Chloride: 106 mmol/L (ref 98–111)
Creatinine: 0.94 mg/dL (ref 0.44–1.00)
GFR, Estimated: >60 mL/min (ref >60)
Glucose, Bld: 86 mg/dL (ref 70–99)
Potassium: 4.4 mmol/L (ref 3.5–5.1)
Sodium: 141 mmol/L (ref 135–145)
Total Bilirubin: <0.2 mg/dL — ABNORMAL LOW (ref 0.3–1.2)
Total Protein: 6.1 g/dL — ABNORMAL LOW (ref 6.5–8.1)

## 2020-12-12 LAB — MAGNESIUM: Magnesium: 1.8 mg/dL (ref 1.7–2.4)

## 2020-12-12 MED ORDER — SODIUM CHLORIDE 0.9 % IV SOLN
150.0000 mg | Freq: Once | INTRAVENOUS | Status: AC
  Administered 2020-12-12: 150 mg via INTRAVENOUS
  Filled 2020-12-12: qty 150

## 2020-12-12 MED ORDER — SODIUM CHLORIDE 0.9 % IV SOLN
100.0000 mg/m2 | Freq: Once | INTRAVENOUS | Status: AC
  Administered 2020-12-12: 200 mg via INTRAVENOUS
  Filled 2020-12-12: qty 10

## 2020-12-12 MED ORDER — SODIUM CHLORIDE 0.9 % IV SOLN
10.0000 mg | Freq: Once | INTRAVENOUS | Status: AC
  Administered 2020-12-12: 10 mg via INTRAVENOUS
  Filled 2020-12-12: qty 10

## 2020-12-12 MED ORDER — POTASSIUM CHLORIDE IN NACL 20-0.9 MEQ/L-% IV SOLN
Freq: Once | INTRAVENOUS | Status: AC
  Filled 2020-12-12: qty 1000

## 2020-12-12 MED ORDER — SODIUM CHLORIDE 0.9 % IV SOLN: Freq: Once | INTRAVENOUS | Status: AC

## 2020-12-12 MED ORDER — SODIUM CHLORIDE 0.9 % IV SOLN
60.0000 mg/m2 | Freq: Once | INTRAVENOUS | Status: AC
  Administered 2020-12-12: 120 mg via INTRAVENOUS
  Filled 2020-12-12: qty 120

## 2020-12-12 MED ORDER — PALONOSETRON HCL INJECTION 0.25 MG/5ML
0.2500 mg | Freq: Once | INTRAVENOUS | Status: AC
  Administered 2020-12-12: 0.25 mg via INTRAVENOUS
  Filled 2020-12-12: qty 5

## 2020-12-12 MED ORDER — MAGNESIUM SULFATE 2 GM/50ML IV SOLN
2.0000 g | Freq: Once | INTRAVENOUS | Status: AC
  Administered 2020-12-12: 2 g via INTRAVENOUS
  Filled 2020-12-12: qty 50

## 2020-12-12 NOTE — Patient Instructions (Signed)
[facility] ONCOLOGY  Discharge Instructions: Thank you for choosing [facility] to provide your oncology and hematology care.   If you have a lab appointment with the [facility], please go directly to the [facility] and check in at the registration area.   Wear comfortable clothing and clothing appropriate for easy access to any Portacath or PICC line.   We strive to give you quality time with your provider. You may need to reschedule your appointment if you arrive late (15 or more minutes).  Arriving late affects you and other patients whose appointments are after yours.  Also, if you miss three or more appointments without notifying the office, you may be dismissed from the clinic at the provider's discretion.      For prescription refill requests, have your pharmacy contact our office and allow 72 hours for refills to be completed.    Today you received the following chemotherapy and/or immunotherapy agents: Cisplatin & Etoposide    To help prevent nausea and vomiting after your treatment, we encourage you to take your nausea medication as directed.  BELOW ARE SYMPTOMS THAT SHOULD BE REPORTED IMMEDIATELY: *FEVER GREATER THAN 100.4 F (38 C) OR HIGHER *CHILLS OR SWEATING *NAUSEA AND VOMITING THAT IS NOT CONTROLLED WITH YOUR NAUSEA MEDICATION *UNUSUAL SHORTNESS OF BREATH *UNUSUAL BRUISING OR BLEEDING *URINARY PROBLEMS (pain or burning when urinating, or frequent urination) *BOWEL PROBLEMS (unusual diarrhea, constipation, pain near the anus) TENDERNESS IN MOUTH AND THROAT WITH OR WITHOUT PRESENCE OF ULCERS (sore throat, sores in mouth, or a toothache) UNUSUAL RASH, SWELLING OR PAIN  UNUSUAL VAGINAL DISCHARGE OR ITCHING   Items with * indicate a potential emergency and should be followed up as soon as possible or go to the Emergency Department if any problems should occur.  Please show the CHEMOTHERAPY ALERT CARD or IMMUNOTHERAPY ALERT CARD at  check-in to the Emergency Department and triage nurse.  Should you have questions after your visit or need to cancel or reschedule your appointment, please contact [facility]  Dept: [phone]  and follow the prompts.  Office hours are 8:00 a.m. to 4:30 p.m. Monday - Friday. Please note that voicemails left after 4:00 p.m. may not be returned until the following business day.  We are closed weekends and major holidays. You have access to a nurse at all times for urgent questions. Please call the main number to the clinic Dept: [phone] and follow the prompts.   For any non-urgent questions, you may also contact your provider using MyChart. We now offer e-Visits for anyone 53 and older to request care online for non-urgent symptoms. For details visit [email or web address].   Also download the MyChart app! Go to the app store, search "MyChart", open the app, select [facility], and log in with your MyChart username and password.  Due to Covid, a mask is required upon entering the hospital/clinic. If you do not have a mask, one will be given to you upon arrival. For doctor visits, patients may have 1 support person aged 91 or older with them. For treatment visits, patients cannot have anyone with them due to current Covid guidelines and our immunocompromised population.

## 2020-12-12 NOTE — Progress Notes (Signed)
[facility] [phone]   Fax:([phone]  OFFICE PROGRESS NOTE  [name], MD [place] [address]  DIAGNOSIS:  Limited stage (T2a, N2, M0) small cell lung cancer presented with left lower lobe lung mass in addition to left infrahilar and subcarinal lymphadenopathy diagnosed in October 2022.   PRIOR THERAPY: None   CURRENT THERAPY: She is currently undergoing concurrent chemoradiation with cisplatin 80 mg/m2  on days 1 and etoposide 100 mg/m2 on days 1, 2, and 3 IV every 3 weeks. First dose 10/31/20.  Status post 2 cycles.  INTERVAL HISTORY: Patricia [name] 70 y.o. female returns to the clinic today for follow-up visit accompanied by her husband.  The patient is feeling fine today with no concerning complaints except for fatigue.  She denied having any current chest pain, shortness of breath, cough or hemoptysis.  She denied having any nausea, vomiting, diarrhea or constipation.  She has no headache or visual changes.  She lost few pounds recently.  She denied having any fever or chills.  She is here today for evaluation with repeat CT scan of the chest for restaging of her disease.  MEDICAL HISTORY: Past Medical History:  Diagnosis Date   Anxiety    GERD (gastroesophageal reflux disease)    Hyperlipidemia    Hypertension    IBS (irritable bowel syndrome)    Insomnia    Low back pain    Lung cancer ([place]) 10/2020   TIA (transient ischemic attack) 10/2017   no deficits   Vitamin B12 deficiency    Vitamin D deficiency     ALLERGIES:  is allergic to augmentin [amoxicillin-pot clavulanate], levaquin [levofloxacin in d5w], mom [magnesium hydroxide], and oxycodone.  MEDICATIONS:  Current Outpatient Medications  Medication Sig Dispense Refill   acetaminophen (TYLENOL) 325 MG tablet 1 tablet as needed.     budesonide-formoterol (SYMBICORT) 80-4.5 MCG/ACT inhaler Take 2 puffs first thing in am and then another 2  puffs about 12 hours later. 1 each 12   carboxymethylcellulose (REFRESH PLUS) 0.5 % SOLN Place 1 drop into both eyes 3 (three) times daily as needed (for dryness).      carisoprodol (SOMA) 350 MG tablet Take 350 mg by mouth 3 (three) times daily.   5   Cholecalciferol (VITAMIN D3) 50 MCG (2000 UT) TABS Take 2,000 Units by mouth daily.     clobetasol ointment (TEMOVATE) 5.64 % Apply 1 application topically See admin instructions. Apply to vaginal area daily as directed     clonazePAM (KLONOPIN) 1 MG tablet Take 1 mg by mouth at bedtime.   3   ELIQUIS 5 MG TABS tablet TAKE 1 TABLET(5 MG) BY MOUTH TWICE DAILY 60 tablet 6   flecainide (TAMBOCOR) 50 MG tablet Take 1 tablet (50 mg total) by mouth 2 (two) times daily. 180 tablet 3   fluticasone (FLONASE) 50 MCG/ACT nasal spray Place 2 sprays into both nostrils daily as needed for allergies or rhinitis.   5   furosemide (LASIX) 20 MG tablet Take 1 tablet (20 mg total) by mouth daily. 30 tablet 2   gabapentin (NEURONTIN) 600 MG tablet Take 600 mg by mouth See admin instructions. Take 600 mg by mouth in the morning and at lunchtime     hyoscyamine (LEVSIN) 0.125 MG tablet Take 0.125 mg by mouth as needed.     lidocaine (XYLOCAINE) 2 % solution Use as directed 15 mLs in the mouth or throat every 6 (  six) hours as needed for mouth pain. 100 mL 0   linaclotide (LINZESS) 145 MCG CAPS capsule Take 1 capsule (145 mcg total) by mouth daily at 4 PM. 30 capsule 0   magic mouthwash SOLN Take 5 mLs by mouth 4 (four) times daily as needed for mouth pain. Pt is allergic to Magnesium 240 mL 1   magnesium oxide (MAG-OX) 400 (240 Mg) MG tablet Take 1 tablet (400 mg total) by mouth daily. 30 tablet 1   metoprolol tartrate (LOPRESSOR) 50 MG tablet Take 1 tablet (50 mg total) by mouth 2 (two) times daily. 60 tablet 0   nystatin (MYCOSTATIN) 100000 UNIT/ML suspension Take 5 mLs (500,000 Units total) by mouth 4 (four) times daily. 60 mL 0   omeprazole (PRILOSEC) 20 MG capsule  Take 20 mg by mouth daily before breakfast.      ondansetron (ZOFRAN-ODT) 8 MG disintegrating tablet Take 8 mg by mouth 3 (three) times daily.     polyethylene glycol (MIRALAX / GLYCOLAX) packet Take 34 g by mouth in the morning.      potassium chloride SA (KLOR-CON) 20 MEQ tablet Take 1 tablet (20 mEq total) by mouth daily. 5 tablet 0   prochlorperazine (COMPAZINE) 10 MG tablet Take 1 tablet (10 mg total) by mouth every 6 (six) hours as needed for nausea or vomiting. 30 tablet 0   rosuvastatin (CRESTOR) 5 MG tablet Take 5 mg by mouth 3 (three) times a week. Mondays Wednesdays Fridays     sucralfate (CARAFATE) 1 g tablet Take 1 tablet (1 g total) by mouth 4 (four) times daily. Dissolve each tablet in 15 cc water before use. 120 tablet 2   Tiotropium Bromide-Olodaterol (STIOLTO RESPIMAT) 2.5-2.5 MCG/ACT AERS Inhale 2 puffs into the lungs daily. 4 g 0   Tiotropium Bromide-Olodaterol 2.5-2.5 MCG/ACT AERS Inhale 2 puffs into the lungs daily. 4 g 5   traMADol (ULTRAM) 50 MG tablet Take 100 mg by mouth as needed.     VENTOLIN HFA 108 (90 Base) MCG/ACT inhaler Inhale 2 puffs into the lungs every 6 (six) hours as needed for wheezing or shortness of breath.   5   zaleplon (SONATA) 10 MG capsule Take 10 mg by mouth at bedtime as needed (for interrupted sleep).      No current facility-administered medications for this visit.    SURGICAL HISTORY:  Past Surgical History:  Procedure Laterality Date   BACK SURGERY     BREAST EXCISIONAL BIOPSY Left 1997   benign   BRONCHIAL BIOPSY  10/18/2020   Procedure: BRONCHIAL BIOPSIES;  Surgeon: [name], DO;  Location: [facility];  Service: Pulmonary;;   BRONCHIAL BRUSHINGS  10/18/2020   Procedure: BRONCHIAL BRUSHINGS;  Surgeon: [name], DO;  Location: [facility];  Service: Pulmonary;;   BRONCHIAL NEEDLE ASPIRATION BIOPSY  10/18/2020   Procedure: BRONCHIAL NEEDLE ASPIRATION BIOPSIES;  Surgeon: [name], DO;  Location: [facility];   Service: Pulmonary;;   BRONCHIAL WASHINGS  10/18/2020   Procedure: BRONCHIAL WASHINGS;  Surgeon: [name], DO;  Location: [facility];  Service: Pulmonary;;   hemorrhoidecotmy     HERNIA MESH REMOVAL     OPEN REDUCTION INTERNAL FIXATION (ORIF) DISTAL RADIAL FRACTURE Right 11/19/2019   Procedure: OPEN REDUCTION INTERNAL FIXATION (ORIF) DISTAL RADIUS AND ULNA FRACTURE WITH REPAIR AS NECESSARY;  Surgeon: Roseanne Kaufman, MD;  Location: [facility];  Service: Orthopedics;  Laterality: Right;  2 hrs Block with IV sedation   ORIF RADIUS & ULNA FRACTURES  TONSILLECTOMY     VIDEO BRONCHOSCOPY WITH ENDOBRONCHIAL NAVIGATION Left 10/18/2020   Procedure: VIDEO BRONCHOSCOPY WITH ENDOBRONCHIAL NAVIGATION;  Surgeon: [name], DO;  Location: [facility];  Service: Pulmonary;  Laterality: Left;  ION   VIDEO BRONCHOSCOPY WITH ENDOBRONCHIAL ULTRASOUND Bilateral 10/18/2020   Procedure: VIDEO BRONCHOSCOPY WITH ENDOBRONCHIAL ULTRASOUND;  Surgeon: [name], DO;  Location: [facility];  Service: Pulmonary;  Laterality: Bilateral;   VIDEO BRONCHOSCOPY WITH RADIAL ENDOBRONCHIAL ULTRASOUND  10/18/2020   Procedure: VIDEO BRONCHOSCOPY WITH RADIAL ENDOBRONCHIAL ULTRASOUND;  Surgeon: [name], DO;  Location: [facility];  Service: Pulmonary;;    REVIEW OF SYSTEMS:  Constitutional: positive for fatigue Eyes: negative Ears, nose, mouth, throat, and face: negative Respiratory: negative Cardiovascular: negative Gastrointestinal: negative Genitourinary:negative Integument/breast: negative Hematologic/lymphatic: negative Musculoskeletal:negative Neurological: negative Behavioral/Psych: negative Endocrine: negative Allergic/Immunologic: negative   PHYSICAL EXAMINATION: General appearance: alert, cooperative, fatigued, and no distress Head: Normocephalic, without obvious abnormality, atraumatic Neck: no adenopathy, no JVD, supple, symmetrical, trachea midline, and thyroid not enlarged,  symmetric, no tenderness/mass/nodules Lymph nodes: Cervical, supraclavicular, and axillary nodes normal. Resp: clear to auscultation bilaterally Back: symmetric, no curvature. ROM normal. No CVA tenderness. Cardio: regular rate and rhythm, S1, S2 normal, no murmur, click, rub or gallop GI: soft, non-tender; bowel sounds normal; no masses,  no organomegaly Extremities: extremities normal, atraumatic, no cyanosis or edema Neurologic: Alert and oriented X 3, normal strength and tone. Normal symmetric reflexes. Normal coordination and gait  ECOG PERFORMANCE STATUS: 1 - Symptomatic but completely ambulatory  Blood pressure 136/72, pulse 75, temperature (!) 97 F (36.1 C), temperature source Tympanic, resp. rate 20, height 5\' 6"  (1.676 m), weight 186 lb 8 oz (84.6 kg), SpO2 98 %.  LABORATORY DATA: Lab Results  Component Value Date   WBC 1.0 (L) 12/05/2020   HGB 8.9 (L) 12/05/2020   HCT 25.3 (L) 12/05/2020   MCV 100.0 12/05/2020   PLT 106 (L) 12/05/2020      Chemistry      Component Value Date/Time   NA 140 12/05/2020 1406   K 4.0 12/05/2020 1406   CL 103 12/05/2020 1406   CO2 28 12/05/2020 1406   BUN 17 12/05/2020 1406   CREATININE 0.88 12/05/2020 1406      Component Value Date/Time   CALCIUM 8.6 (L) 12/05/2020 1406   ALKPHOS 97 12/05/2020 1406   AST 16 12/05/2020 1406   ALT 12 12/05/2020 1406   BILITOT 0.2 (L) 12/05/2020 1406       RADIOGRAPHIC STUDIES: CT Chest W Contrast  Result Date: 12/09/2020 CLINICAL DATA:  Primary Cancer Type: Lung Imaging Indication: Assess response to therapy Interval therapy since last imaging? Yes Initial Cancer Diagnosis Date: 10/18/2020; Established by: Biopsy-proven Detailed Pathology: Limited stage small cell lung cancer. Primary Tumor location: Left lower lobe. Surgeries: No thoracic. Chemotherapy: Yes; Ongoing? Yes; Most recent administration: 11/21/2020 Immunotherapy? No Radiation therapy? Yes; Date Range: 11/21/2020 - 12/08/2020; Target:  Left lung EXAM: CT CHEST WITH CONTRAST TECHNIQUE: Multidetector CT imaging of the chest was performed during intravenous contrast administration. CONTRAST:  16mL OMNIPAQUE IOHEXOL 350 MG/ML SOLN COMPARISON:  Most recent CT chest 10/15/2020.  11/04/2020 PET-CT. FINDINGS: Cardiovascular: Calcified atheromatous plaque of the thoracic aorta. No aneurysmal dilation. Normal heart size. Three-vessel coronary calcification. Normal appearance of central pulmonary vasculature on venous phase. Mediastinum/Nodes: No thoracic inlet, axillary, mediastinal or hilar adenopathy. Esophagus grossly normal. Resolution of subcarinal nodal enlargement. Now measuring 10 mm short axis as compared to 20 mm short axis. LEFT infrahilar nodal tissue  at 5 mm short axis, previously 9 mm short axis. Adenopathy adjacent to LEFT inferior pulmonary vein and adjacent nodule is no longer evident. Contralateral hilar nodal tissue remains un enlarged at approximately 10 mm short axis. Lungs/Pleura: Pulmonary emphysema as before. Basilar scarring and atelectasis. No signs of suspicious nodule. Trace pleural effusion and pleural thickening in the LEFT chest without nodular characteristics airways are patent. Upper Abdomen: Imaged portions the upper abdomen without acute process or significant finding. Musculoskeletal: No acute bone finding. No destructive bone process. Spinal degenerative changes. Signs of cement augmentation of the L2 vertebral body with similar appearance to prior imaging. IMPRESSION: Marked interval response to therapy. Near complete resolution of nodal tissue in the subcarinal region and LEFT hilum when compared to the prior study, no areas with persistent pathologic enlargement. No change in non FDG avid lymph nodes elsewhere in the chest. Pulmonary emphysema. Aortic atherosclerosis. Aortic Atherosclerosis (ICD10-I70.0) and Emphysema (ICD10-J43.9). Electronically Signed   By: Zetta Bills M.D.   On: 12/09/2020 11:27    ASSESSMENT  AND PLAN: This is a very pleasant 70 years old white female recently diagnosed with limited stage (T2 a, N2, M0) small cell lung cancer presented with left lower lobe lung mass in addition to left hilar and subcarinal lymphadenopathy in October 2022.  The patient is currently undergoing a course of systemic chemotherapy with cisplatin and etoposide status post 2 cycles concurrent with radiation. She has been tolerating her treatment well with no concerning adverse effect except for fatigue. The patient had repeat CT scan of the chest performed recently.  I personally and independently reviewed the scan images and discussed the results with the patient and her husband today. Her scan showed significant improvement in her disease with almost complete resolution of the nodal tissue in the subcarinal region and left hilum. I recommended for the patient to continue her current treatment with systemic chemotherapy with cisplatin and Etoposide and she will proceed with cycle #3 today.  She will also continue with the concurrent course of radiotherapy. For the chemotherapy-induced neutropenia, will consider The patient for filgrastim injection on as-needed basis during her treatment. The patient will come back for follow-up visit in 3 weeks for evaluation before starting cycle #4. She was advised to call immediately if she has any other concerning symptoms in the interval. The patient voices understanding of current disease status and treatment options and is in agreement with the current care plan.  All questions were answered. The patient knows to call the clinic with any problems, questions or concerns. We can certainly see the patient much sooner if necessary. The total time spent in the appointment was 30 minutes.  Disclaimer: This note was dictated with voice recognition software. Similar sounding words can inadvertently be transcribed and may not be corrected upon review.

## 2020-12-13 ENCOUNTER — Encounter: Payer: Self-pay | Admitting: *Deleted

## 2020-12-13 ENCOUNTER — Ambulatory Visit
Admission: RE | Admit: 2020-12-13 | Discharge: 2020-12-13 | Disposition: A | Discharge status: Home / Self Care | Payer: Medicare HMO | Source: Ambulatory Visit | Attending: Radiation Oncology | Admitting: Radiation Oncology

## 2020-12-13 ENCOUNTER — Inpatient Hospital Stay: Payer: Medicare HMO

## 2020-12-13 ENCOUNTER — Ambulatory Visit (INDEPENDENT_AMBULATORY_CARE_PROVIDER_SITE_OTHER): Payer: Medicare HMO | Admitting: *Deleted

## 2020-12-13 VITALS — BP 135/75 | HR 64 | Temp 98.4°F | Resp 18

## 2020-12-13 VITALS — HR 67 | Ht 66.0 in

## 2020-12-13 DIAGNOSIS — C3432 Malignant neoplasm of lower lobe, left bronchus or lung: Secondary | ICD-10-CM

## 2020-12-13 DIAGNOSIS — Z5111 Encounter for antineoplastic chemotherapy: Secondary | ICD-10-CM | POA: Diagnosis not present

## 2020-12-13 DIAGNOSIS — D701 Agranulocytosis secondary to cancer chemotherapy: Secondary | ICD-10-CM | POA: Diagnosis not present

## 2020-12-13 DIAGNOSIS — Z79899 Other long term (current) drug therapy: Secondary | ICD-10-CM | POA: Diagnosis not present

## 2020-12-13 DIAGNOSIS — D649 Anemia, unspecified: Secondary | ICD-10-CM | POA: Diagnosis not present

## 2020-12-13 DIAGNOSIS — Z510 Encounter for antineoplastic radiation therapy: Secondary | ICD-10-CM | POA: Diagnosis not present

## 2020-12-13 DIAGNOSIS — I480 Paroxysmal atrial fibrillation: Secondary | ICD-10-CM | POA: Diagnosis not present

## 2020-12-13 DIAGNOSIS — E876 Hypokalemia: Secondary | ICD-10-CM | POA: Diagnosis not present

## 2020-12-13 DIAGNOSIS — C771 Secondary and unspecified malignant neoplasm of intrathoracic lymph nodes: Secondary | ICD-10-CM | POA: Diagnosis not present

## 2020-12-13 MED ORDER — SODIUM CHLORIDE 0.9 % IV SOLN
100.0000 mg/m2 | Freq: Once | INTRAVENOUS | Status: AC
  Administered 2020-12-13: 200 mg via INTRAVENOUS
  Filled 2020-12-13: qty 10

## 2020-12-13 MED ORDER — SODIUM CHLORIDE 0.9 % IV SOLN
10.0000 mg | Freq: Once | INTRAVENOUS | Status: AC
  Administered 2020-12-13: 10 mg via INTRAVENOUS
  Filled 2020-12-13: qty 10

## 2020-12-13 MED ORDER — SODIUM CHLORIDE 0.9 % IV SOLN: Freq: Once | INTRAVENOUS | Status: AC

## 2020-12-13 MED FILL — Dexamethasone Sodium Phosphate Inj 100 MG/10ML: INTRAMUSCULAR | Qty: 1 | Status: AC

## 2020-12-13 NOTE — Addendum Note (Signed)
Addended by: Stanton Kidney on: 12/13/2020 11:46 AM   Modules accepted: Level of Service

## 2020-12-13 NOTE — Patient Instructions (Signed)
Medication Instructions:  Your physician recommends that you continue on your current medications as directed. Please refer to the Current Medication list given to you today.  *If you need a refill on your cardiac medications before your next appointment, please call your pharmacy*   Lab Work: None ordered   Testing/Procedures: None ordered   Follow-Up: At [facility], you and your health needs are our priority.  As part of our continuing mission to provide you with exceptional heart care, we have created designated Provider Care Teams.  These Care Teams include your primary Cardiologist (physician) and Advanced Practice Providers (APPs -  Physician Assistants and Nurse Practitioners) who all work together to provide you with the care you need, when you need it.  Your next appointment:   Keep your currently scheduled follow up on 12/20/2020  @ 11:20 am  The format for your next appointment:   In Person  Provider:   Tommye Standard, PA-C    Thank you for choosing [facility]!!   Trinidad Curet, RN [phone]

## 2020-12-13 NOTE — Progress Notes (Signed)
1.  Reason for visit: EKG post flecainide start  2.  Name of MD requesting visit:  Tommye Standard, PA  3. H&P:  see epic  4.  ROS related to problem:  see epic  5.  Assessment and plan per MD:   EKG post Flecainide start. PR 208 ms QRS 84 ms QT/QTc 424/448 ms  Reviewed by Dr. Curt Bears.  Continue current treatment plan.

## 2020-12-14 ENCOUNTER — Inpatient Hospital Stay: Payer: Medicare HMO

## 2020-12-14 ENCOUNTER — Ambulatory Visit
Admission: RE | Admit: 2020-12-14 | Discharge: 2020-12-14 | Disposition: A | Discharge status: Home / Self Care | Payer: Medicare HMO | Source: Ambulatory Visit | Attending: Radiation Oncology | Admitting: Radiation Oncology

## 2020-12-14 ENCOUNTER — Other Ambulatory Visit: Payer: Self-pay

## 2020-12-14 VITALS — BP 137/83 | HR 64 | Temp 98.6°F | Resp 17

## 2020-12-14 DIAGNOSIS — D649 Anemia, unspecified: Secondary | ICD-10-CM | POA: Diagnosis not present

## 2020-12-14 DIAGNOSIS — E876 Hypokalemia: Secondary | ICD-10-CM | POA: Diagnosis not present

## 2020-12-14 DIAGNOSIS — C3432 Malignant neoplasm of lower lobe, left bronchus or lung: Secondary | ICD-10-CM | POA: Diagnosis not present

## 2020-12-14 DIAGNOSIS — Z510 Encounter for antineoplastic radiation therapy: Secondary | ICD-10-CM | POA: Diagnosis not present

## 2020-12-14 DIAGNOSIS — C771 Secondary and unspecified malignant neoplasm of intrathoracic lymph nodes: Secondary | ICD-10-CM | POA: Diagnosis not present

## 2020-12-14 DIAGNOSIS — Z79899 Other long term (current) drug therapy: Secondary | ICD-10-CM | POA: Diagnosis not present

## 2020-12-14 DIAGNOSIS — Z5111 Encounter for antineoplastic chemotherapy: Secondary | ICD-10-CM | POA: Diagnosis not present

## 2020-12-14 DIAGNOSIS — D701 Agranulocytosis secondary to cancer chemotherapy: Secondary | ICD-10-CM | POA: Diagnosis not present

## 2020-12-14 MED ORDER — SODIUM CHLORIDE 0.9 % IV SOLN
10.0000 mg | Freq: Once | INTRAVENOUS | Status: AC
  Administered 2020-12-14: 10 mg via INTRAVENOUS
  Filled 2020-12-14: qty 10

## 2020-12-14 MED ORDER — SODIUM CHLORIDE 0.9 % IV SOLN: Freq: Once | INTRAVENOUS | Status: AC

## 2020-12-14 MED ORDER — SODIUM CHLORIDE 0.9 % IV SOLN
100.0000 mg/m2 | Freq: Once | INTRAVENOUS | Status: AC
  Administered 2020-12-14: 200 mg via INTRAVENOUS
  Filled 2020-12-14: qty 10

## 2020-12-14 NOTE — Patient Instructions (Signed)
[facility]  Discharge Instructions: Thank you for choosing [facility] to provide your oncology and hematology care.   If you have a lab appointment with the Cancer Center, please go directly to the Cancer Center and check in at the registration area.   Wear comfortable clothing and clothing appropriate for easy access to any Portacath or PICC line.   We strive to give you quality time with your provider. You may need to reschedule your appointment if you arrive late (15 or more minutes).  Arriving late affects you and other patients whose appointments are after yours.  Also, if you miss three or more appointments without notifying the office, you may be dismissed from the clinic at the provider's discretion.      For prescription refill requests, have your pharmacy contact our office and allow 72 hours for refills to be completed.    Today you received the following chemotherapy and/or immunotherapy agents: etoposide.     To help prevent nausea and vomiting after your treatment, we encourage you to take your nausea medication as directed.  BELOW ARE SYMPTOMS THAT SHOULD BE REPORTED IMMEDIATELY: . *FEVER GREATER THAN 100.4 F (38 C) OR HIGHER . *CHILLS OR SWEATING . *NAUSEA AND VOMITING THAT IS NOT CONTROLLED WITH YOUR NAUSEA MEDICATION . *UNUSUAL SHORTNESS OF BREATH . *UNUSUAL BRUISING OR BLEEDING . *URINARY PROBLEMS (pain or burning when urinating, or frequent urination) . *BOWEL PROBLEMS (unusual diarrhea, constipation, pain near the anus) . TENDERNESS IN MOUTH AND THROAT WITH OR WITHOUT PRESENCE OF ULCERS (sore throat, sores in mouth, or a toothache) . UNUSUAL RASH, SWELLING OR PAIN  . UNUSUAL VAGINAL DISCHARGE OR ITCHING   Items with * indicate a potential emergency and should be followed up as soon as possible or go to the Emergency Department if any problems should occur.  Please show the CHEMOTHERAPY ALERT CARD or IMMUNOTHERAPY ALERT  CARD at check-in to the Emergency Department and triage nurse.  Should you have questions after your visit or need to cancel or reschedule your appointment, please contact [facility]  Dept: [phone]  and follow the prompts.  Office hours are 8:00 a.m. to 4:30 p.m. Monday - Friday. Please note that voicemails left after 4:00 p.m. may not be returned until the following business day.  We are closed weekends and major holidays. You have access to a nurse at all times for urgent questions. Please call the main number to the clinic Dept: [phone] and follow the prompts.   For any non-urgent questions, you may also contact your provider using MyChart. We now offer e-Visits for anyone 18 and older to request care online for non-urgent symptoms. For details visit [email or web address].   Also download the MyChart app! Go to the app store, search "MyChart", open the app, select , and log in with your MyChart username and password.  Due to Covid, a mask is required upon entering the hospital/clinic. If you do not have a mask, one will be given to you upon arrival. For doctor visits, patients may have 1 support person aged 18 or older with them. For treatment visits, patients cannot have anyone with them due to current Covid guidelines and our immunocompromised population.   

## 2020-12-15 ENCOUNTER — Other Ambulatory Visit: Payer: Self-pay | Admitting: Radiation Oncology

## 2020-12-15 ENCOUNTER — Ambulatory Visit
Admission: RE | Admit: 2020-12-15 | Discharge: 2020-12-15 | Disposition: A | Discharge status: Home / Self Care | Payer: Medicare HMO | Source: Ambulatory Visit | Attending: Radiation Oncology | Admitting: Radiation Oncology

## 2020-12-15 DIAGNOSIS — D649 Anemia, unspecified: Secondary | ICD-10-CM | POA: Diagnosis not present

## 2020-12-15 DIAGNOSIS — C3432 Malignant neoplasm of lower lobe, left bronchus or lung: Secondary | ICD-10-CM | POA: Diagnosis not present

## 2020-12-15 DIAGNOSIS — Z5111 Encounter for antineoplastic chemotherapy: Secondary | ICD-10-CM | POA: Diagnosis not present

## 2020-12-15 DIAGNOSIS — D701 Agranulocytosis secondary to cancer chemotherapy: Secondary | ICD-10-CM | POA: Diagnosis not present

## 2020-12-15 DIAGNOSIS — E876 Hypokalemia: Secondary | ICD-10-CM | POA: Diagnosis not present

## 2020-12-15 DIAGNOSIS — Z79899 Other long term (current) drug therapy: Secondary | ICD-10-CM | POA: Diagnosis not present

## 2020-12-15 DIAGNOSIS — Z510 Encounter for antineoplastic radiation therapy: Secondary | ICD-10-CM | POA: Diagnosis not present

## 2020-12-15 DIAGNOSIS — C771 Secondary and unspecified malignant neoplasm of intrathoracic lymph nodes: Secondary | ICD-10-CM | POA: Diagnosis not present

## 2020-12-15 MED ORDER — NYSTATIN 100000 UNIT/ML MT SUSP: 5.0000 mL | Freq: Four times a day (QID) | OROMUCOSAL | 0 refills | Status: DC

## 2020-12-16 ENCOUNTER — Other Ambulatory Visit: Payer: Self-pay

## 2020-12-16 ENCOUNTER — Ambulatory Visit
Admission: RE | Admit: 2020-12-16 | Discharge: 2020-12-16 | Disposition: A | Discharge status: Home / Self Care | Payer: Medicare HMO | Source: Ambulatory Visit | Attending: Radiation Oncology | Admitting: Radiation Oncology

## 2020-12-16 ENCOUNTER — Other Ambulatory Visit: Payer: Self-pay | Admitting: Radiation Oncology

## 2020-12-16 DIAGNOSIS — E876 Hypokalemia: Secondary | ICD-10-CM | POA: Diagnosis not present

## 2020-12-16 DIAGNOSIS — D649 Anemia, unspecified: Secondary | ICD-10-CM | POA: Diagnosis not present

## 2020-12-16 DIAGNOSIS — D701 Agranulocytosis secondary to cancer chemotherapy: Secondary | ICD-10-CM | POA: Diagnosis not present

## 2020-12-16 DIAGNOSIS — C771 Secondary and unspecified malignant neoplasm of intrathoracic lymph nodes: Secondary | ICD-10-CM | POA: Diagnosis not present

## 2020-12-16 DIAGNOSIS — C3432 Malignant neoplasm of lower lobe, left bronchus or lung: Secondary | ICD-10-CM | POA: Diagnosis not present

## 2020-12-16 DIAGNOSIS — Z79899 Other long term (current) drug therapy: Secondary | ICD-10-CM | POA: Diagnosis not present

## 2020-12-16 DIAGNOSIS — Z5111 Encounter for antineoplastic chemotherapy: Secondary | ICD-10-CM | POA: Diagnosis not present

## 2020-12-16 DIAGNOSIS — Z510 Encounter for antineoplastic radiation therapy: Secondary | ICD-10-CM | POA: Diagnosis not present

## 2020-12-16 MED ORDER — NYSTATIN 100000 UNIT/ML MT SUSP: 5.0000 mL | Freq: Four times a day (QID) | OROMUCOSAL | 5 refills | Status: DC

## 2020-12-16 MED ORDER — HYDROCODONE-ACETAMINOPHEN 7.5-325 MG/15ML PO SOLN: 15.0000 mL | Freq: Four times a day (QID) | ORAL | 0 refills | Status: DC | PRN

## 2020-12-18 ENCOUNTER — Emergency Department (HOSPITAL_COMMUNITY)
Admission: EM | Admit: 2020-12-18 | Discharge: 2020-12-18 | Disposition: A | Discharge status: Home / Self Care | Payer: Medicare HMO | Attending: Emergency Medicine | Admitting: Emergency Medicine

## 2020-12-18 ENCOUNTER — Encounter (HOSPITAL_COMMUNITY): Payer: Self-pay

## 2020-12-18 ENCOUNTER — Emergency Department (HOSPITAL_COMMUNITY): Payer: Medicare HMO

## 2020-12-18 DIAGNOSIS — Z87891 Personal history of nicotine dependence: Secondary | ICD-10-CM | POA: Diagnosis not present

## 2020-12-18 DIAGNOSIS — I4891 Unspecified atrial fibrillation: Secondary | ICD-10-CM | POA: Diagnosis not present

## 2020-12-18 DIAGNOSIS — Z7901 Long term (current) use of anticoagulants: Secondary | ICD-10-CM | POA: Insufficient documentation

## 2020-12-18 DIAGNOSIS — R079 Chest pain, unspecified: Secondary | ICD-10-CM | POA: Diagnosis not present

## 2020-12-18 DIAGNOSIS — J45909 Unspecified asthma, uncomplicated: Secondary | ICD-10-CM | POA: Diagnosis not present

## 2020-12-18 DIAGNOSIS — I10 Essential (primary) hypertension: Secondary | ICD-10-CM | POA: Diagnosis not present

## 2020-12-18 DIAGNOSIS — K209 Esophagitis, unspecified without bleeding: Secondary | ICD-10-CM | POA: Diagnosis not present

## 2020-12-18 DIAGNOSIS — Z7951 Long term (current) use of inhaled steroids: Secondary | ICD-10-CM | POA: Diagnosis not present

## 2020-12-18 LAB — CBC
HCT: 26.4 % — ABNORMAL LOW (ref 36.0–46.0)
Hemoglobin: 9 g/dL — ABNORMAL LOW (ref 12.0–15.0)
MCH: 35.3 pg — ABNORMAL HIGH (ref 26.0–34.0)
MCHC: 34.1 g/dL (ref 30.0–36.0)
MCV: 103.5 fL — ABNORMAL HIGH (ref 80.0–100.0)
Platelets: 287 10*3/uL (ref 150–400)
RBC: 2.55 MIL/uL — ABNORMAL LOW (ref 3.87–5.11)
RDW: 15.7 % — ABNORMAL HIGH (ref 11.5–15.5)
WBC: 2.7 10*3/uL — ABNORMAL LOW (ref 4.0–10.5)
nRBC: 0 % (ref 0.0–0.2)

## 2020-12-18 LAB — HEPATIC FUNCTION PANEL
ALT: 17 U/L (ref 0–44)
AST: 16 U/L (ref 15–41)
Albumin: 3.8 g/dL (ref 3.5–5.0)
Alkaline Phosphatase: 75 U/L (ref 38–126)
Bilirubin, Direct: 0.1 mg/dL (ref 0.0–0.2)
Indirect Bilirubin: 0.7 mg/dL (ref 0.3–0.9)
Total Bilirubin: 0.8 mg/dL (ref 0.3–1.2)
Total Protein: 6.5 g/dL (ref 6.5–8.1)

## 2020-12-18 LAB — BASIC METABOLIC PANEL
Anion gap: 7 (ref 5–15)
BUN: 25 mg/dL — ABNORMAL HIGH (ref 8–23)
CO2: 27 mmol/L (ref 22–32)
Calcium: 8.3 mg/dL — ABNORMAL LOW (ref 8.9–10.3)
Chloride: 100 mmol/L (ref 98–111)
Creatinine, Ser: 0.91 mg/dL (ref 0.44–1.00)
GFR, Estimated: >60 mL/min (ref >60)
Glucose, Bld: 120 mg/dL — ABNORMAL HIGH (ref 70–99)
Potassium: 4.5 mmol/L (ref 3.5–5.1)
Sodium: 134 mmol/L — ABNORMAL LOW (ref 135–145)

## 2020-12-18 LAB — TROPONIN I (HIGH SENSITIVITY): Troponin I (High Sensitivity): 9 ng/L (ref <18)

## 2020-12-18 LAB — MAGNESIUM: Magnesium: 1.6 mg/dL — ABNORMAL LOW (ref 1.7–2.4)

## 2020-12-18 MED ORDER — FLUCONAZOLE 100 MG PO TABS: 200.0000 mg | ORAL_TABLET | Freq: Every day | ORAL | 0 refills | Status: DC

## 2020-12-18 MED ORDER — METOPROLOL TARTRATE 5 MG/5ML IV SOLN
5.0000 mg | Freq: Once | INTRAVENOUS | Status: AC
  Administered 2020-12-18: 5 mg via INTRAVENOUS
  Filled 2020-12-18: qty 5

## 2020-12-18 MED ORDER — HYDROMORPHONE HCL 1 MG/ML IJ SOLN
1.0000 mg | Freq: Once | INTRAMUSCULAR | Status: AC
  Administered 2020-12-18: 1 mg via INTRAVENOUS
  Filled 2020-12-18: qty 1

## 2020-12-18 MED ORDER — LIDOCAINE VISCOUS HCL 2 % MT SOLN: 15.0000 mL | Freq: Four times a day (QID) | OROMUCOSAL | 0 refills | Status: DC | PRN

## 2020-12-18 MED ORDER — LACTATED RINGERS IV BOLUS
1000.0000 mL | Freq: Once | INTRAVENOUS | Status: AC
  Administered 2020-12-18: 1000 mL via INTRAVENOUS

## 2020-12-18 MED ORDER — HYDROCODONE-ACETAMINOPHEN 7.5-325 MG PO TABS: 1.0000 | ORAL_TABLET | Freq: Four times a day (QID) | ORAL | 0 refills | Status: DC | PRN

## 2020-12-18 MED ORDER — MAGNESIUM SULFATE 2 GM/50ML IV SOLN
2.0000 g | Freq: Once | INTRAVENOUS | Status: AC
  Administered 2020-12-18: 2 g via INTRAVENOUS
  Filled 2020-12-18: qty 50

## 2020-12-18 MED ORDER — METOPROLOL TARTRATE 5 MG/5ML IV SOLN
2.5000 mg | Freq: Once | INTRAVENOUS | Status: AC
  Administered 2020-12-18: 2.5 mg via INTRAVENOUS
  Filled 2020-12-18: qty 5

## 2020-12-18 NOTE — ED Triage Notes (Signed)
Pt reports generalized weakness, SHOB, and chest pain since Thursday. She reports having multiple chemo and radiation treatments. She endorses esophagus burning and pain that radiates to back. He of small cell carcinoma of her left lung.

## 2020-12-18 NOTE — Progress Notes (Signed)
Cardiology Office Note Date:  12/18/2020  Patient ID:  Patricia [name] November 19, 1950, MRN [id number] PCP:  [name], MD  Cardiologist/Electrophysiologist: Dr. Curt Bears    Chief Complaint: post ER visit  History of Present Illness: Patricia [name] is a 70 y.o. female with history of HTN, HLD, TIA, IBS, SVT, AFib, chronic back pain, lung Ca (non-operable)  She was referred to Dr. Curt Bears June 2020 for an episode of SVT.  She had worn a monitor noting SVT, no AFib (pending final report), planned to discuss case with neurology perhaps consider linq.  The patient wanted to avoid medicines/procedures, was instructed on vagal maneuvers. Final monitor report: Zero atrial fibrillation Sinus rhythm with episodes of paroxysmal SVT   Nov 2021, called with palpitations recommended 2 week monitoring, mentioned issues with pain as well and planned to f/u with her surgeon, thinking perhaps triggered by pain. 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 Date of surgery November 20, 2019)  12/14/19 had an ER visit brought via EMS, reportedly treated by EMS succesfully with adenosine Reported historically vagal maneuvers, cold rag on her face would resolve her palpitations until then. (ECG images noted in ED note) Was wearing long term monitor. She was prescribed lopressor 25mg  BID and discharged from the ER  I saw her Dec 2021 She is feeling well outside of her SVT and R wrist. She denies any symptoms of CP, SOB, DOE, She is limited with her are casted currently otherwise denies any limitations or concerns. No dizzy spells, near syncope or syncope. She had not had any SVT since her last visit, thinks her fall, wrist fracture has triggered some palpitations. The day she called EMS was lasting hours, began to feel weak, lightheaded, numb in her hands, feet and was quite  scary. As soon as they stopped it she felt better. She is tolerating the metoprolol and has not had further palpitations. She mailed the monitor back in yesterday She has been a week without smoking!  Discussed treatment strategies, she preferred to avoid procedures, planned to continue BB, titrate if needed, consider AAD if more   10/01/20 hospitalized with syncope, she was ill with recurrent loose stools for days more then her usual, she was hyponatremic, orthostatic vitals inconsistent. She did have SVT (probably an ATach) though syncope felt 2/2 hypovolemia. 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  She had a biopsy of her cancer at that time.  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  She has been since been started on chemotherapy  She saw Dr. Curt Bears in f/u Oct 27/22, had some brief/mild palpitations, tolerating chemo, no changes were made.  ER visit 11/11/20 with palpitations, tachycardia, EKG was ST 104bpm, was hypokalemic (3.4) (noted by her oncologist as well) and low mag (1.2), started on K+ supplementation, developed Afib w/RVR and given IV lopressor x2 and discharged from the ER back in SR  Saw Dr. Valeta Harms 11/16/20, noted she was planned for XRT with oncology.  Suspected component of COPD and started on maintenance inhaler  Called 11/11 with reports of elevated HRs given an appt 11/29  I saw her 12/06/20 She has had several episodes of racing heart rates associated with marked weakness and near syncope.  She had one this AM, they last 20-30 min, she has to lay or sit down immediately She denies CP,  has not fainted She in general has had a  mild increase in her SOB with the start of her chemo/XRT. When not in RVR/her tachycardia she denies any near syncope or syncope. No obvious bleeding, CBC/counts drifting downward, no one has asked her to stop her Eliquis She has been started on something she says to help with her  neutrophils In d/w Dr. Curt Bears, started on flecainide 50mg  BID  12/18/20 ER visit with painful swallowing found with esophagitis She was in AFib w/RVR 110's-130's,, unaware from a symptom perspective, given a couple dosed of metoprolol with lottle affect, pt declined admisison  TODAY Chemo and XRT are starting to take their toll on her, she generally feels poorly, tired all of the time. Her throat hurts, says then they gave her the dilaudid in the ER was the first time she has felt OK in several weeks. She has not missed any Eliquis doses for sure She is not certain that she has been in Af the whole time, her B suff tells her her HR is in the 60's when ehs checks it, despite having the sense her HR is beating fast No CP No syncope Sunday she was in so much pain and felt so bad though she thought she might faint. Gets winded with exertion, but not any worse then it has been. No rest SOB   Past Medical History:  Diagnosis Date   Anxiety    GERD (gastroesophageal reflux disease)    Hyperlipidemia    Hypertension    IBS (irritable bowel syndrome)    Insomnia    Low back pain    Lung cancer ([place]) 10/2020   TIA (transient ischemic attack) 10/2017   no deficits   Vitamin B12 deficiency    Vitamin D deficiency     Past Surgical History:  Procedure Laterality Date   BACK SURGERY     BREAST EXCISIONAL BIOPSY Left 1997   benign   BRONCHIAL BIOPSY  10/18/2020   Procedure: BRONCHIAL BIOPSIES;  Surgeon: [name], DO;  Location: [facility];  Service: Pulmonary;;   BRONCHIAL BRUSHINGS  10/18/2020   Procedure: BRONCHIAL BRUSHINGS;  Surgeon: [name], DO;  Location: [facility];  Service: Pulmonary;;   BRONCHIAL NEEDLE ASPIRATION BIOPSY  10/18/2020   Procedure: BRONCHIAL NEEDLE ASPIRATION BIOPSIES;  Surgeon: [name], DO;  Location: [facility];  Service: Pulmonary;;   BRONCHIAL WASHINGS  10/18/2020   Procedure: BRONCHIAL WASHINGS;  Surgeon: [name],  DO;  Location: [facility];  Service: Pulmonary;;   hemorrhoidecotmy     HERNIA MESH REMOVAL     OPEN REDUCTION INTERNAL FIXATION (ORIF) DISTAL RADIAL FRACTURE Right 11/19/2019   Procedure: OPEN REDUCTION INTERNAL FIXATION (ORIF) DISTAL RADIUS AND ULNA FRACTURE WITH REPAIR AS NECESSARY;  Surgeon: Roseanne Kaufman, MD;  Location: [facility];  Service: Orthopedics;  Laterality: Right;  2 hrs Block with IV sedation   ORIF RADIUS & ULNA FRACTURES     TONSILLECTOMY     VIDEO BRONCHOSCOPY WITH ENDOBRONCHIAL NAVIGATION Left 10/18/2020   Procedure: VIDEO BRONCHOSCOPY WITH ENDOBRONCHIAL NAVIGATION;  Surgeon: [name], DO;  Location: [facility];  Service: Pulmonary;  Laterality: Left;  ION   VIDEO BRONCHOSCOPY WITH ENDOBRONCHIAL ULTRASOUND Bilateral 10/18/2020   Procedure: VIDEO BRONCHOSCOPY WITH ENDOBRONCHIAL ULTRASOUND;  Surgeon: [name], DO;  Location: [facility];  Service: Pulmonary;  Laterality: Bilateral;   VIDEO BRONCHOSCOPY WITH RADIAL ENDOBRONCHIAL ULTRASOUND  10/18/2020   Procedure: VIDEO BRONCHOSCOPY WITH RADIAL ENDOBRONCHIAL ULTRASOUND;  Surgeon: June Leap  L, DO;  Location: [facility];  Service: Pulmonary;;    Current Outpatient Medications  Medication Sig Dispense Refill   acetaminophen (TYLENOL) 325 MG tablet 1 tablet as needed.     budesonide-formoterol (SYMBICORT) 80-4.5 MCG/ACT inhaler Take 2 puffs first thing in am and then another 2 puffs about 12 hours later. 1 each 12   carboxymethylcellulose (REFRESH PLUS) 0.5 % SOLN Place 1 drop into both eyes 3 (three) times daily as needed (for dryness).      carisoprodol (SOMA) 350 MG tablet Take 350 mg by mouth 3 (three) times daily.   5   Cholecalciferol (VITAMIN D3) 50 MCG (2000 UT) TABS Take 2,000 Units by mouth daily.     clobetasol ointment (TEMOVATE) 3.32 % Apply 1 application topically See admin instructions. Apply to vaginal area daily as directed     clonazePAM (KLONOPIN) 1 MG tablet Take 1 mg by mouth at  bedtime.   3   ELIQUIS 5 MG TABS tablet TAKE 1 TABLET(5 MG) BY MOUTH TWICE DAILY 60 tablet 6   flecainide (TAMBOCOR) 50 MG tablet Take 1 tablet (50 mg total) by mouth 2 (two) times daily. 180 tablet 3   fluticasone (FLONASE) 50 MCG/ACT nasal spray Place 2 sprays into both nostrils daily as needed for allergies or rhinitis.   5   furosemide (LASIX) 20 MG tablet Take 1 tablet (20 mg total) by mouth daily. 30 tablet 2   gabapentin (NEURONTIN) 600 MG tablet Take 600 mg by mouth See admin instructions. Take 600 mg by mouth in the morning and at lunchtime     HYDROcodone-acetaminophen (HYCET) 7.5-325 mg/15 ml solution Take 15 mLs by mouth every 6 (six) hours as needed for severe pain (For Cancer Pain). 473 mL 0   hyoscyamine (LEVSIN) 0.125 MG tablet Take 0.125 mg by mouth as needed.     lidocaine (XYLOCAINE) 2 % solution Use as directed 15 mLs in the mouth or throat every 6 (six) hours as needed for mouth pain. 100 mL 0   linaclotide (LINZESS) 145 MCG CAPS capsule Take 1 capsule (145 mcg total) by mouth daily at 4 PM. 30 capsule 0   magic mouthwash SOLN Take 5 mLs by mouth 4 (four) times daily as needed for mouth pain. Pt is allergic to Magnesium 240 mL 1   magnesium oxide (MAG-OX) 400 (240 Mg) MG tablet Take 1 tablet (400 mg total) by mouth daily. 30 tablet 1   metoprolol tartrate (LOPRESSOR) 50 MG tablet Take 1 tablet (50 mg total) by mouth 2 (two) times daily. 60 tablet 0   nystatin (MYCOSTATIN) 100000 UNIT/ML suspension Take 5 mLs (500,000 Units total) by mouth 4 (four) times daily. 440 mL 5   omeprazole (PRILOSEC) 20 MG capsule Take 20 mg by mouth daily before breakfast.      ondansetron (ZOFRAN-ODT) 8 MG disintegrating tablet Take 8 mg by mouth 3 (three) times daily.     polyethylene glycol (MIRALAX / GLYCOLAX) packet Take 34 g by mouth in the morning.      potassium chloride SA (KLOR-CON) 20 MEQ tablet Take 1 tablet (20 mEq total) by mouth daily. 5 tablet 0   prochlorperazine (COMPAZINE) 10 MG  tablet Take 1 tablet (10 mg total) by mouth every 6 (six) hours as needed for nausea or vomiting. 30 tablet 0   rosuvastatin (CRESTOR) 5 MG tablet Take 5 mg by mouth 3 (three) times a week. Mondays Wednesdays Fridays     sodium chloride 1 g tablet Take 1  g by mouth 3 (three) times daily.     sucralfate (CARAFATE) 1 g tablet Take 1 tablet (1 g total) by mouth 4 (four) times daily. Dissolve each tablet in 15 cc water before use. 120 tablet 2   Tiotropium Bromide-Olodaterol 2.5-2.5 MCG/ACT AERS Inhale 2 puffs into the lungs daily. 4 g 5   traMADol (ULTRAM) 50 MG tablet Take 100 mg by mouth as needed.     VENTOLIN HFA 108 (90 Base) MCG/ACT inhaler Inhale 2 puffs into the lungs every 6 (six) hours as needed for wheezing or shortness of breath.   5   zaleplon (SONATA) 10 MG capsule Take 10 mg by mouth at bedtime as needed (for interrupted sleep).      No current facility-administered medications for this visit.    Allergies:   Augmentin [amoxicillin-pot clavulanate], Levaquin [levofloxacin in d5w], Mom [magnesium hydroxide], and Oxycodone   Social History:  The patient  reports that she quit smoking about a year ago. Her smoking use included cigarettes. She started smoking about 50 years ago. She has a 50.00 pack-year smoking history. She has never used smokeless tobacco. She reports that she does not currently use alcohol. She reports that she does not use drugs.   Family History:  The patient's family history includes Breast cancer in her maternal aunt, paternal aunt, and sister.  ROS:  Please see the history of present illness.    All other systems are reviewed and otherwise negative.   PHYSICAL EXAM:  VS:  LMP  (LMP Unknown)  BMI: There is no height or weight on file to calculate BMI. Well nourished, well developed, in no acute distress HEENT: normocephalic, atraumatic Neck: no JVD, carotid bruits or masses Cardiac:  RRR; tachycardic no significant murmurs, no rubs, or gallops Lungs:  CTA  b/l, no wheezing, rhonchi or rales Abd: soft, nontender MS: no deformity or atrophy Ext: no edema Skin: warm and dry, no rash Neuro:  No gross deficits appreciated Psych: euthymic mood, full affect     EKG:  done today and reviewed by myself AFlutter (looks typical) 125bpm, QRS is narrow, machine reads 132, though I think this includes part of her flutter wave 12/18/20 AFib 120bpm, QRS 36ms 12/06/20 SR 75bpm, PVC, PR 173ms, QRS 78ms, QTc 43ms    10/01/20: TTE  1. Left ventricular ejection fraction, by estimation, is 60 to 65%. The  left ventricle has normal function. The left ventricle has no regional  wall motion abnormalities. Left ventricular diastolic parameters are  consistent with Grade I diastolic dysfunction (impaired relaxation).   2. Right ventricular systolic function is normal. The right ventricular  size is normal.   3. Prominent epicardial fat.   4. The mitral valve is normal in structure. No evidence of mitral valve  regurgitation. No evidence of mitral stenosis.   5. The aortic valve was not well visualized. There is mild calcification  of the aortic valve. Aortic valve regurgitation is not visualized. Mild  aortic valve sclerosis is present, with no evidence of aortic valve  stenosis.   6. The inferior vena cava is normal in size with <50% respiratory  variability, suggesting right atrial pressure of 8 mmHg.    2 weeks event monitor 12/08/19: Max 234 bpm 02:47pm, 12/01 Min 46 bpm 11:11pm, 12/07 Avg 76 bpm Less than 1% ventricular and supraventricular ectopy The predominant underlying rhythm was sinus rhythm 2% atrial fibrillation burden with heart rates of 107 to 234 bpm Triggered events associated with both atrial fibrillation and  SVT.   TTE 10/25/17 - Left ventricle: The cavity size was normal. Systolic function was   normal. The estimated ejection fraction was in the range of 55%   to 60%. Wall motion was normal; there were no regional wall    motion abnormalities. Doppler parameters are consistent with   abnormal left ventricular relaxation (grade 1 diastolic   dysfunction). There was no evidence of elevated ventricular   filling pressure by Doppler parameters. - Aortic valve: There was no regurgitation. - Mitral valve: There was trivial regurgitation. - Right ventricle: The cavity size was normal. Wall thickness was   normal. Systolic function was normal. - Tricuspid valve: There was mild regurgitation. - Pulmonary arteries: Systolic pressure was within the normal   range. - Inferior vena cava: The vessel was normal in size. - Pericardium, extracardiac: There was no pericardial effusion.   Cardiac monitor 06/12/2018  Sinus rhythm with episodes of SVT  Recent Labs: 10/14/2020: TSH 2.021 12/18/2020: ALT 17; BUN 25; Creatinine, Ser 0.91; Hemoglobin 9.0; Magnesium 1.6; Platelets 287; Potassium 4.5; Sodium 134  No results found for requested labs within last 8760 hours.   Estimated Creatinine Clearance: 62.1 mL/min (by C-G formula based on SCr of 0.91 mg/dL).   Wt Readings from Last 3 Encounters:  12/12/20 186 lb 8 oz (84.6 kg)  12/06/20 184 lb 6.4 oz (83.6 kg)  11/21/20 191 lb 1.6 oz (86.7 kg)     Other studies reviewed: Additional studies/records reviewed today include: summarized above  ASSESSMENT AND PLAN:  1. SVT Likely an ATach 2. Paroxysmal AFib 3. Aflutter CHA2DS2Vasc is 5, on Eliquis, appropriately dosed  She does not want to go to the ER, discussed going to get DCCV and likely then home Will increase her flecainide to 100mg  BID, continue her metoprolol She understands importance of not missing any doses of her Eliquis Discussed I suspect her BP cuff is having difficulty getting accurate HR Discussed ER precautions, she says she will go if she starts to feel worse/poorly  EKG Thursday    2. HTN     Looks ok      Disposition: as above, 2 weeks otherwise   Current medicines are reviewed at  length with the patient today.  The patient did not have any concerns regarding medicines.  Venetia Night, PA-C 12/18/2020 1:53 PM     [place] [address] [phone] (office)  [phone] (fax)

## 2020-12-18 NOTE — ED Provider Notes (Signed)
[facility] DEPT Provider Note   CSN: [id number] Arrival date & time: 12/18/20  1022     History Chief Complaint  Patient presents with   Weakness   Chest Pain    Patricia [name] is a 70 y.o. female.  HPI 70 year old female presents with generalized weakness and chest pain. Started about 3 days ago. The chest pain feels internal, like her prior esophagitis. She's been having painful swallowing as well. No oral thrush. No dyspnea or fever/cough. She is on her 3rd round of chemo/radiation starting 6 days ago. Was started on nystatin 3 days ago but has run out and it didn't help. Pain is more severe than typical. She is in afib, but denies current palpitations.  She is able to swallow but then gets pain afterwards.  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   Lung nodule 10/18/2020   Anxiety    AF (paroxysmal atrial fibrillation) ([facility])    Paroxysmal SVT (supraventricular tachycardia) ([facility])    Chronic pain    Hyponatremia 10/01/2020   Essential hypertension 10/01/2020   Syncope 09/30/2020   Asthmatic bronchitis 08/11/2020   Colles' fracture of right radius, initial encounter for closed fracture 11/19/2019    Past Surgical History:  Procedure Laterality Date   BACK SURGERY     BREAST EXCISIONAL BIOPSY Left 1997   benign   BRONCHIAL BIOPSY  10/18/2020   Procedure: BRONCHIAL BIOPSIES;  Surgeon: [name], DO;   Location: [facility];  Service: Pulmonary;;   BRONCHIAL BRUSHINGS  10/18/2020   Procedure: BRONCHIAL BRUSHINGS;  Surgeon: [name], DO;  Location: [facility];  Service: Pulmonary;;   BRONCHIAL NEEDLE ASPIRATION BIOPSY  10/18/2020   Procedure: BRONCHIAL NEEDLE ASPIRATION BIOPSIES;  Surgeon: [name], DO;  Location: [facility];  Service: Pulmonary;;   BRONCHIAL WASHINGS  10/18/2020   Procedure: BRONCHIAL WASHINGS;  Surgeon: [name], DO;  Location: [facility];  Service: Pulmonary;;   hemorrhoidecotmy     HERNIA MESH REMOVAL     OPEN REDUCTION INTERNAL FIXATION (ORIF) DISTAL RADIAL FRACTURE Right 11/19/2019   Procedure: OPEN REDUCTION INTERNAL FIXATION (ORIF) DISTAL RADIUS AND ULNA FRACTURE WITH REPAIR AS NECESSARY;  Surgeon: Roseanne Kaufman, MD;  Location: [facility];  Service: Orthopedics;  Laterality: Right;  2 hrs Block with IV sedation   ORIF RADIUS & ULNA FRACTURES     TONSILLECTOMY     VIDEO BRONCHOSCOPY WITH ENDOBRONCHIAL NAVIGATION Left 10/18/2020   Procedure: VIDEO BRONCHOSCOPY WITH ENDOBRONCHIAL NAVIGATION;  Surgeon: [name], DO;  Location: [facility];  Service: Pulmonary;  Laterality: Left;  ION   VIDEO BRONCHOSCOPY WITH ENDOBRONCHIAL ULTRASOUND Bilateral 10/18/2020   Procedure: VIDEO BRONCHOSCOPY WITH ENDOBRONCHIAL ULTRASOUND;  Surgeon: [name], DO;  Location: [facility];  Service: Pulmonary;  Laterality: Bilateral;   VIDEO BRONCHOSCOPY WITH RADIAL ENDOBRONCHIAL ULTRASOUND  10/18/2020   Procedure: VIDEO BRONCHOSCOPY  WITH RADIAL ENDOBRONCHIAL ULTRASOUND;  Surgeon: [name], DO;  Location: [facility];  Service: Pulmonary;;     OB History   No obstetric history on file.     Family History  Problem Relation Age of Onset   Breast cancer Sister    Breast cancer Maternal Aunt    Breast cancer Paternal Aunt     Social History   Tobacco Use   Smoking status: Former    Packs/day: 1.00    Years: 50.00    Pack years:  50.00    Types: Cigarettes    Start date: 85    Quit date: 12/14/2019    Years since quitting: 1.0   Smokeless tobacco: Never  Vaping Use   Vaping Use: Never used  Substance Use Topics   Alcohol use: Not Currently    Comment: occ once every 2 months   Drug use: Never    Home Medications Prior to Admission medications   Medication Sig Start Date End Date Taking? Authorizing Provider  fluconazole (DIFLUCAN) 100 MG tablet Take 2 tablets (200 mg total) by mouth daily for 14 days. Take 400 mg on first day, then 200 mg per day 12/18/20 01/01/21 Yes Sherwood Gambler, MD  HYDROcodone-acetaminophen (NORCO) 7.5-325 MG tablet Take 1 tablet by mouth every 6 (six) hours as needed for severe pain. 12/18/20  Yes Sherwood Gambler, MD  acetaminophen (TYLENOL) 325 MG tablet 1 tablet as needed.    [provider]  budesonide-formoterol (SYMBICORT) 80-4.5 MCG/ACT inhaler Take 2 puffs first thing in am and then another 2 puffs about 12 hours later. 08/11/20   Tanda Rockers, MD  carboxymethylcellulose (REFRESH PLUS) 0.5 % SOLN Place 1 drop into both eyes 3 (three) times daily as needed (for dryness).     [provider]  carisoprodol (SOMA) 350 MG tablet Take 350 mg by mouth 3 (three) times daily.  09/26/17   [provider]  Cholecalciferol (VITAMIN D3) 50 MCG (2000 UT) TABS Take 2,000 Units by mouth daily.    [provider]  clobetasol ointment (TEMOVATE) 2.44 % Apply 1 application topically See admin instructions. Apply to vaginal area daily as directed    [provider]  clonazePAM (KLONOPIN) 1 MG tablet Take 1 mg by mouth at bedtime.  10/15/17   [provider]  ELIQUIS 5 MG TABS tablet TAKE 1 TABLET(5 MG) BY MOUTH TWICE DAILY 10/03/20   Camnitz, Ocie Doyne, MD  flecainide (TAMBOCOR) 50 MG tablet Take 1 tablet (50 mg total) by mouth 2 (two) times daily. 12/06/20   Baldwin Jamaica, PA-C  fluticasone (FLONASE) 50 MCG/ACT nasal spray Place 2 sprays into  both nostrils daily as needed for allergies or rhinitis.  10/11/17   [provider]  furosemide (LASIX) 20 MG tablet Take 1 tablet (20 mg total) by mouth daily. 10/20/20 01/18/21  Terrilee Croak, MD  gabapentin (NEURONTIN) 600 MG tablet Take 600 mg by mouth See admin instructions. Take 600 mg by mouth in the morning and at lunchtime 09/16/17   [provider]  hyoscyamine (LEVSIN) 0.125 MG tablet Take 0.125 mg by mouth as needed. 02/06/10   [provider]  lidocaine (XYLOCAINE) 2 % solution Use as directed 15 mLs in the mouth or throat every 6 (six) hours as needed for mouth pain. 12/18/20   Sherwood Gambler, MD  linaclotide [facility]) 145 MCG CAPS capsule Take 1 capsule (145 mcg total) by mouth daily at 4 PM. 10/03/20   Debbe Odea, MD  magic mouthwash SOLN Take 5 mLs by mouth 4 (four) times daily as needed for mouth pain. Pt is allergic to Magnesium 11/04/20   Walisiewicz, Kaitlyn E, PA-C  magnesium oxide (MAG-OX) 400 (240 Mg) MG tablet Take 1 tablet (400 mg total) by mouth daily. 11/08/20   Heilingoetter, Cassandra L, PA-C  metoprolol tartrate (LOPRESSOR) 50 MG tablet Take 1 tablet (50 mg total) by mouth 2 (two) times daily. 10/02/20   Debbe Odea, MD  nystatin (MYCOSTATIN) 100000 UNIT/ML suspension Take 5 mLs (500,000 Units total) by mouth 4 (four) times daily. 12/16/20   Tyler Pita, MD  omeprazole (PRILOSEC) 20 MG capsule Take 20 mg by mouth daily before breakfast.  08/27/17   [provider]  ondansetron (ZOFRAN-ODT) 8 MG disintegrating tablet Take 8 mg by mouth 3 (three) times daily. 10/13/20   [provider]  polyethylene glycol (MIRALAX / GLYCOLAX) packet Take 34 g by mouth in the morning.     [provider]  potassium chloride SA (KLOR-CON) 20 MEQ tablet Take 1 tablet (20 mEq total) by mouth daily. 11/08/20   Heilingoetter, Cassandra L, PA-C  prochlorperazine (COMPAZINE) 10 MG tablet Take 1 tablet (10 mg total) by mouth every 6 (six) hours  as needed for nausea or vomiting. 10/27/20   Curt Bears, MD  rosuvastatin (CRESTOR) 5 MG tablet Take 5 mg by mouth 3 (three) times a week. Mondays Wednesdays Fridays    [provider]  sodium chloride 1 g tablet Take 1 g by mouth 3 (three) times daily. 11/26/20   [provider]  sucralfate (CARAFATE) 1 g tablet Take 1 tablet (1 g total) by mouth 4 (four) times daily. Dissolve each tablet in 15 cc water before use. 12/09/20   Kyung Rudd, MD  Tiotropium Bromide-Olodaterol 2.5-2.5 MCG/ACT AERS Inhale 2 puffs into the lungs daily. 11/16/20   Icard, Octavio Graves, DO  traMADol (ULTRAM) 50 MG tablet Take 100 mg by mouth as needed. 10/08/19   [provider]  VENTOLIN HFA 108 (90 Base) MCG/ACT inhaler Inhale 2 puffs into the lungs every 6 (six) hours as needed for wheezing or shortness of breath.  09/30/17   [provider]  zaleplon (SONATA) 10 MG capsule Take 10 mg by mouth at bedtime as needed (for interrupted sleep).     [provider]    Allergies    Augmentin [amoxicillin-pot clavulanate], Levaquin [levofloxacin in d5w], Mom [magnesium hydroxide], and Oxycodone  Review of Systems   Review of Systems  Constitutional:  Negative for fever.  Respiratory:  Negative for cough and shortness of breath.   Cardiovascular:  Positive for chest pain.  Gastrointestinal:  Positive for vomiting. Negative for abdominal pain.  Neurological:  Positive for weakness.  All other systems reviewed and are negative.  Physical Exam Updated Vital Signs BP (!) 141/106   Pulse (!) 139   Temp 98.1 F (36.7 C) (Oral)   Resp (!) 24   LMP  (LMP Unknown)   SpO2 98%   Physical Exam Vitals and nursing note reviewed.  Constitutional:      Appearance: She is well-developed.  HENT:     Head: Normocephalic and atraumatic.     Right Ear: External ear normal.     Left Ear: External ear normal.     Nose: Nose normal.     Mouth/Throat:     Comments: No oral thrush Eyes:      General:        Right eye: No discharge.  Left eye: No discharge.  Cardiovascular:     Rate and Rhythm: Tachycardia present. Rhythm irregular.     Heart sounds: Normal heart sounds.  Pulmonary:     Effort: Pulmonary effort is normal.     Breath sounds: Normal breath sounds.  Abdominal:     Palpations: Abdomen is soft.     Tenderness: There is no abdominal tenderness.  Skin:    General: Skin is warm and dry.  Neurological:     Mental Status: She is alert.  Psychiatric:        Mood and Affect: Mood is not anxious.    ED Results / Procedures / Treatments   Labs (all labs ordered are listed, but only abnormal results are displayed) Labs Reviewed  BASIC METABOLIC PANEL - Abnormal; Notable for the following components:      Result Value   Sodium 134 (*)    Glucose, Bld 120 (*)    BUN 25 (*)    Calcium 8.3 (*)    All other components within normal limits  CBC - Abnormal; Notable for the following components:   WBC 2.7 (*)    RBC 2.55 (*)    Hemoglobin 9.0 (*)    HCT 26.4 (*)    MCV 103.5 (*)    MCH 35.3 (*)    RDW 15.7 (*)    All other components within normal limits  MAGNESIUM - Abnormal; Notable for the following components:   Magnesium 1.6 (*)    All other components within normal limits  HEPATIC FUNCTION PANEL  URINALYSIS, ROUTINE W REFLEX MICROSCOPIC  TROPONIN I (HIGH SENSITIVITY)    EKG EKG Interpretation  Date/Time:  Sunday December 18 2020 10:34:54 EST Ventricular Rate:  120 PR Interval:    QRS Duration: 81 QT Interval:  316 QTC Calculation: 447 R Axis:   76 Text Interpretation: Atrial fibrillation similar to Nov 2022 Confirmed by Sherwood Gambler [phone]) on 12/18/2020 11:17:51 AM  Radiology DG Chest 2 View  Result Date: 12/18/2020 CLINICAL DATA:  Chest pain EXAM: CHEST - 2 VIEW COMPARISON:  Chest x-ray dated November 11, 2020 FINDINGS: Cardiac and mediastinal contours are within normal limits. Mild left basilar opacities. No focal  consolidation. No pleural effusion or pneumothorax. IMPRESSION: Mild left basilar opacities, likely due to scarring or atelectasis. Electronically Signed   By: Yetta Glassman M.D.   On: 12/18/2020 11:37    Procedures Procedures   Medications Ordered in ED Medications  lactated ringers bolus 1,000 mL (0 mLs Intravenous Stopped 12/18/20 1313)  HYDROmorphone (DILAUDID) injection 1 mg (1 mg Intravenous Given 12/18/20 1114)  magnesium sulfate IVPB 2 g 50 mL (0 g Intravenous Stopped 12/18/20 1313)  metoprolol tartrate (LOPRESSOR) injection 2.5 mg (2.5 mg Intravenous Given 12/18/20 1213)  metoprolol tartrate (LOPRESSOR) injection 5 mg (5 mg Intravenous Given 12/18/20 1313)    ED Course  I have reviewed the triage vital signs and the nursing notes.  Pertinent labs & imaging results that were available during my care of the patient were reviewed by me and considered in my medical decision making (see chart for details).    MDM Rules/Calculators/A&P                           Patient's pain is completely resolved after IV Dilaudid.  She was given IV fluids.  She is noted to have A. fib with RVR which is a recurrent issue.  Magnesium was repleted.  Otherwise her electrolytes show  no significant abnormalities.  Her white blood cells are a little down as expected.  Anemia is stable.  From an A. fib perspective she does not appear symptomatic.  No palpitations, shortness of breath, chest pain.  I discussed with her oncologist, Dr. 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  He is okay with lidocaine as long as she is careful.  Otherwise, the main issue is that while she is feeling better from weakness standpoint she is still in A. fib with RVR.  As typical with A. fib is fluctuating between 110-1 130s.  She was given a couple dose of metoprolol which may be helped a little but overall she is still tachycardic.  Multiple discussions with patient and  family and she does not want to be admitted.  She wants to go home which I do not think is unreasonable but I discussed that there could be long-term effects of having a heart rate that high.  She understands this, still wants to go home and will return if symptoms worsen.  We did discuss that because the Hycet is burning when she swallows it, will switch to hydrocodone pills which she think she will tolerate better. Final Clinical Impression(s) / ED Diagnoses Final diagnoses:  Esophagitis  Atrial fibrillation with RVR ([place])    Rx / DC Orders ED Discharge Orders          Ordered    fluconazole (DIFLUCAN) 100 MG tablet  Daily        12/18/20 1511    HYDROcodone-acetaminophen (NORCO) 7.5-325 MG tablet  Every 6 hours PRN        12/18/20 1511    lidocaine (XYLOCAINE) 2 % solution  Every 6 hours PRN        12/18/20 1511             Sherwood Gambler, MD 12/18/20 1517

## 2020-12-19 ENCOUNTER — Other Ambulatory Visit: Payer: Self-pay

## 2020-12-19 ENCOUNTER — Ambulatory Visit
Admission: RE | Admit: 2020-12-19 | Discharge: 2020-12-19 | Disposition: A | Discharge status: Home / Self Care | Payer: Medicare HMO | Source: Ambulatory Visit | Attending: Radiation Oncology | Admitting: Radiation Oncology

## 2020-12-19 DIAGNOSIS — Z79899 Other long term (current) drug therapy: Secondary | ICD-10-CM | POA: Diagnosis not present

## 2020-12-19 DIAGNOSIS — E876 Hypokalemia: Secondary | ICD-10-CM | POA: Diagnosis not present

## 2020-12-19 DIAGNOSIS — Z510 Encounter for antineoplastic radiation therapy: Secondary | ICD-10-CM | POA: Diagnosis not present

## 2020-12-19 DIAGNOSIS — Z5111 Encounter for antineoplastic chemotherapy: Secondary | ICD-10-CM | POA: Diagnosis not present

## 2020-12-19 DIAGNOSIS — C771 Secondary and unspecified malignant neoplasm of intrathoracic lymph nodes: Secondary | ICD-10-CM | POA: Diagnosis not present

## 2020-12-19 DIAGNOSIS — C3432 Malignant neoplasm of lower lobe, left bronchus or lung: Secondary | ICD-10-CM | POA: Diagnosis not present

## 2020-12-19 DIAGNOSIS — D701 Agranulocytosis secondary to cancer chemotherapy: Secondary | ICD-10-CM | POA: Diagnosis not present

## 2020-12-19 DIAGNOSIS — D649 Anemia, unspecified: Secondary | ICD-10-CM | POA: Diagnosis not present

## 2020-12-20 ENCOUNTER — Inpatient Hospital Stay: Payer: Medicare HMO

## 2020-12-20 ENCOUNTER — Encounter: Payer: Self-pay | Admitting: Physician Assistant

## 2020-12-20 ENCOUNTER — Ambulatory Visit
Admission: RE | Admit: 2020-12-20 | Discharge: 2020-12-20 | Disposition: A | Discharge status: Home / Self Care | Payer: Medicare HMO | Source: Ambulatory Visit | Attending: Radiation Oncology | Admitting: Radiation Oncology

## 2020-12-20 ENCOUNTER — Ambulatory Visit: Payer: Medicare HMO | Admitting: Physician Assistant

## 2020-12-20 ENCOUNTER — Other Ambulatory Visit: Payer: Self-pay

## 2020-12-20 VITALS — BP 118/78 | HR 125 | Ht 66.0 in | Wt 185.4 lb

## 2020-12-20 DIAGNOSIS — Z5181 Encounter for therapeutic drug level monitoring: Secondary | ICD-10-CM

## 2020-12-20 DIAGNOSIS — D649 Anemia, unspecified: Secondary | ICD-10-CM | POA: Diagnosis not present

## 2020-12-20 DIAGNOSIS — I471 Supraventricular tachycardia: Secondary | ICD-10-CM

## 2020-12-20 DIAGNOSIS — I480 Paroxysmal atrial fibrillation: Secondary | ICD-10-CM | POA: Diagnosis not present

## 2020-12-20 DIAGNOSIS — I10 Essential (primary) hypertension: Secondary | ICD-10-CM | POA: Diagnosis not present

## 2020-12-20 DIAGNOSIS — Z510 Encounter for antineoplastic radiation therapy: Secondary | ICD-10-CM | POA: Diagnosis not present

## 2020-12-20 DIAGNOSIS — C771 Secondary and unspecified malignant neoplasm of intrathoracic lymph nodes: Secondary | ICD-10-CM | POA: Diagnosis not present

## 2020-12-20 DIAGNOSIS — I483 Typical atrial flutter: Secondary | ICD-10-CM

## 2020-12-20 DIAGNOSIS — E876 Hypokalemia: Secondary | ICD-10-CM | POA: Diagnosis not present

## 2020-12-20 DIAGNOSIS — Z79899 Other long term (current) drug therapy: Secondary | ICD-10-CM | POA: Diagnosis not present

## 2020-12-20 DIAGNOSIS — D701 Agranulocytosis secondary to cancer chemotherapy: Secondary | ICD-10-CM | POA: Diagnosis not present

## 2020-12-20 DIAGNOSIS — C3432 Malignant neoplasm of lower lobe, left bronchus or lung: Secondary | ICD-10-CM

## 2020-12-20 DIAGNOSIS — Z5111 Encounter for antineoplastic chemotherapy: Secondary | ICD-10-CM | POA: Diagnosis not present

## 2020-12-20 LAB — CMP (CANCER CENTER ONLY)
ALT: 12 U/L (ref 0–44)
AST: 13 U/L — ABNORMAL LOW (ref 15–41)
Albumin: 3.4 g/dL — ABNORMAL LOW (ref 3.5–5.0)
Alkaline Phosphatase: 81 U/L (ref 38–126)
Anion gap: 9 (ref 5–15)
BUN: 19 mg/dL (ref 8–23)
CO2: 28 mmol/L (ref 22–32)
Calcium: 8.5 mg/dL — ABNORMAL LOW (ref 8.9–10.3)
Chloride: 102 mmol/L (ref 98–111)
Creatinine: 0.77 mg/dL (ref 0.44–1.00)
GFR, Estimated: >60 mL/min (ref >60)
Glucose, Bld: 100 mg/dL — ABNORMAL HIGH (ref 70–99)
Potassium: 4.2 mmol/L (ref 3.5–5.1)
Sodium: 139 mmol/L (ref 135–145)
Total Bilirubin: 0.5 mg/dL (ref 0.3–1.2)
Total Protein: 6.1 g/dL — ABNORMAL LOW (ref 6.5–8.1)

## 2020-12-20 LAB — CBC WITH DIFFERENTIAL (CANCER CENTER ONLY)
Abs Immature Granulocytes: 0.03 10*3/uL (ref 0.00–0.07)
Basophils Absolute: 0 10*3/uL (ref 0.0–0.1)
Basophils Relative: 2 %
Eosinophils Absolute: 0 10*3/uL (ref 0.0–0.5)
Eosinophils Relative: 0 %
HCT: 21.9 % — ABNORMAL LOW (ref 36.0–46.0)
Hemoglobin: 7.7 g/dL — ABNORMAL LOW (ref 12.0–15.0)
Immature Granulocytes: 2 %
Lymphocytes Relative: 13 %
Lymphs Abs: 0.2 10*3/uL — ABNORMAL LOW (ref 0.7–4.0)
MCH: 36.2 pg — ABNORMAL HIGH (ref 26.0–34.0)
MCHC: 35.2 g/dL (ref 30.0–36.0)
MCV: 102.8 fL — ABNORMAL HIGH (ref 80.0–100.0)
Monocytes Absolute: 0.1 10*3/uL (ref 0.1–1.0)
Monocytes Relative: 3 %
Neutro Abs: 1.6 10*3/uL — ABNORMAL LOW (ref 1.7–7.7)
Neutrophils Relative %: 80 %
Platelet Count: 167 10*3/uL (ref 150–400)
RBC: 2.13 MIL/uL — ABNORMAL LOW (ref 3.87–5.11)
RDW: 15.4 % (ref 11.5–15.5)
WBC Count: 1.9 10*3/uL — ABNORMAL LOW (ref 4.0–10.5)
nRBC: 0 % (ref 0.0–0.2)

## 2020-12-20 LAB — MAGNESIUM: Magnesium: 1.2 mg/dL — ABNORMAL LOW (ref 1.7–2.4)

## 2020-12-20 MED ORDER — FLECAINIDE ACETATE 100 MG PO TABS: 100.0000 mg | ORAL_TABLET | Freq: Two times a day (BID) | ORAL | 1 refills | Status: DC

## 2020-12-20 NOTE — Patient Instructions (Addendum)
Medication Instructions:   START TAKING FLECAINIDE 100 MG TWICE A DAY   *If you need a refill on your cardiac medications before your next appointment, please call your pharmacy*   Lab Work: NONE ORDERED  TODAY   If you have labs (blood work) drawn today and your tests are completely normal, you will receive your results only by: [place] (if you have MyChart) OR A paper copy in the mail If you have any lab test that is abnormal or we need to change your treatment, we will call you to review the results.   Testing/Procedures: NONE ORDERED  TODAY    Follow-Up: At [facility], you and your health needs are our priority.  As part of our continuing mission to provide you with exceptional heart care, we have created designated Provider Care Teams.  These Care Teams include your primary Cardiologist (physician) and Advanced Practice Providers (APPs -  Physician Assistants and Nurse Practitioners) who all work together to provide you with the care you need, when you need it.  We recommend signing up for the patient portal called "MyChart".  Sign up information is provided on this After Visit Summary.  MyChart is used to connect with patients for Virtual Visits (Telemedicine).  Patients are able to view lab/test results, encounter notes, upcoming appointments, etc.  Non-urgent messages can be sent to your provider as well.   To learn more about what you can do with MyChart, go to [email or web address].    Your next appointment:    2 day(s) FOR  EKG   The format for your next appointment:   In Person  Provider:    NURSE VISIT     AND 2 WEEKS FOLLOW UP WITH RENEE URSUY    Other Instructions

## 2020-12-21 ENCOUNTER — Other Ambulatory Visit: Payer: Self-pay | Admitting: Physician Assistant

## 2020-12-21 ENCOUNTER — Other Ambulatory Visit: Payer: Self-pay

## 2020-12-21 ENCOUNTER — Ambulatory Visit
Admission: RE | Admit: 2020-12-21 | Discharge: 2020-12-21 | Disposition: A | Discharge status: Home / Self Care | Payer: Medicare HMO | Source: Ambulatory Visit | Attending: Radiation Oncology | Admitting: Radiation Oncology

## 2020-12-21 ENCOUNTER — Telehealth: Payer: Self-pay

## 2020-12-21 ENCOUNTER — Inpatient Hospital Stay: Payer: Medicare HMO

## 2020-12-21 DIAGNOSIS — Z510 Encounter for antineoplastic radiation therapy: Secondary | ICD-10-CM | POA: Diagnosis not present

## 2020-12-21 DIAGNOSIS — C771 Secondary and unspecified malignant neoplasm of intrathoracic lymph nodes: Secondary | ICD-10-CM | POA: Diagnosis not present

## 2020-12-21 DIAGNOSIS — I10 Essential (primary) hypertension: Secondary | ICD-10-CM | POA: Diagnosis not present

## 2020-12-21 DIAGNOSIS — Z79899 Other long term (current) drug therapy: Secondary | ICD-10-CM | POA: Diagnosis not present

## 2020-12-21 DIAGNOSIS — D649 Anemia, unspecified: Secondary | ICD-10-CM

## 2020-12-21 DIAGNOSIS — E876 Hypokalemia: Secondary | ICD-10-CM | POA: Diagnosis not present

## 2020-12-21 DIAGNOSIS — Z5111 Encounter for antineoplastic chemotherapy: Secondary | ICD-10-CM | POA: Diagnosis not present

## 2020-12-21 DIAGNOSIS — C3432 Malignant neoplasm of lower lobe, left bronchus or lung: Secondary | ICD-10-CM | POA: Diagnosis not present

## 2020-12-21 DIAGNOSIS — D701 Agranulocytosis secondary to cancer chemotherapy: Secondary | ICD-10-CM | POA: Diagnosis not present

## 2020-12-21 DIAGNOSIS — E871 Hypo-osmolality and hyponatremia: Secondary | ICD-10-CM | POA: Diagnosis not present

## 2020-12-21 NOTE — Telephone Encounter (Signed)
Pt called stating she feels very weak and tired and wants to know if she needs blood.

## 2020-12-21 NOTE — Progress Notes (Signed)
Pt has been scheduled for 2 units of PRBCs tomorrow 12/22/20 at 9am in [facility]. Pt is aware and orders/lab appt are in to be drawn today 12/21/20.

## 2020-12-22 ENCOUNTER — Ambulatory Visit (INDEPENDENT_AMBULATORY_CARE_PROVIDER_SITE_OTHER): Payer: Medicare HMO | Admitting: *Deleted

## 2020-12-22 ENCOUNTER — Encounter: Payer: Self-pay | Admitting: Physician Assistant

## 2020-12-22 ENCOUNTER — Encounter: Payer: Self-pay | Admitting: Internal Medicine

## 2020-12-22 ENCOUNTER — Other Ambulatory Visit: Payer: Self-pay

## 2020-12-22 ENCOUNTER — Inpatient Hospital Stay: Payer: Medicare HMO

## 2020-12-22 ENCOUNTER — Ambulatory Visit: Payer: Medicare HMO

## 2020-12-22 VITALS — HR 111 | Ht 66.0 in | Wt 184.5 lb

## 2020-12-22 DIAGNOSIS — I4892 Unspecified atrial flutter: Secondary | ICD-10-CM | POA: Diagnosis present

## 2020-12-22 DIAGNOSIS — C3432 Malignant neoplasm of lower lobe, left bronchus or lung: Secondary | ICD-10-CM | POA: Diagnosis not present

## 2020-12-22 DIAGNOSIS — D61810 Antineoplastic chemotherapy induced pancytopenia: Secondary | ICD-10-CM | POA: Diagnosis not present

## 2020-12-22 DIAGNOSIS — R0609 Other forms of dyspnea: Secondary | ICD-10-CM | POA: Diagnosis not present

## 2020-12-22 DIAGNOSIS — G8929 Other chronic pain: Secondary | ICD-10-CM | POA: Diagnosis not present

## 2020-12-22 DIAGNOSIS — R509 Fever, unspecified: Secondary | ICD-10-CM | POA: Diagnosis not present

## 2020-12-22 DIAGNOSIS — Y842 Radiological procedure and radiotherapy as the cause of abnormal reaction of the patient, or of later complication, without mention of misadventure at the time of the procedure: Secondary | ICD-10-CM | POA: Diagnosis present

## 2020-12-22 DIAGNOSIS — K209 Esophagitis, unspecified without bleeding: Secondary | ICD-10-CM | POA: Diagnosis not present

## 2020-12-22 DIAGNOSIS — A6000 Herpesviral infection of urogenital system, unspecified: Secondary | ICD-10-CM | POA: Diagnosis not present

## 2020-12-22 DIAGNOSIS — N390 Urinary tract infection, site not specified: Secondary | ICD-10-CM | POA: Diagnosis not present

## 2020-12-22 DIAGNOSIS — A6009 Herpesviral infection of other urogenital tract: Secondary | ICD-10-CM | POA: Diagnosis present

## 2020-12-22 DIAGNOSIS — R197 Diarrhea, unspecified: Secondary | ICD-10-CM | POA: Diagnosis present

## 2020-12-22 DIAGNOSIS — J9601 Acute respiratory failure with hypoxia: Secondary | ICD-10-CM | POA: Diagnosis not present

## 2020-12-22 DIAGNOSIS — I10 Essential (primary) hypertension: Secondary | ICD-10-CM | POA: Diagnosis not present

## 2020-12-22 DIAGNOSIS — D630 Anemia in neoplastic disease: Secondary | ICD-10-CM | POA: Diagnosis present

## 2020-12-22 DIAGNOSIS — Z87891 Personal history of nicotine dependence: Secondary | ICD-10-CM | POA: Diagnosis not present

## 2020-12-22 DIAGNOSIS — R0602 Shortness of breath: Secondary | ICD-10-CM | POA: Diagnosis not present

## 2020-12-22 DIAGNOSIS — G894 Chronic pain syndrome: Secondary | ICD-10-CM | POA: Diagnosis present

## 2020-12-22 DIAGNOSIS — Z79899 Other long term (current) drug therapy: Secondary | ICD-10-CM | POA: Diagnosis not present

## 2020-12-22 DIAGNOSIS — C3490 Malignant neoplasm of unspecified part of unspecified bronchus or lung: Secondary | ICD-10-CM | POA: Diagnosis not present

## 2020-12-22 DIAGNOSIS — Z20822 Contact with and (suspected) exposure to covid-19: Secondary | ICD-10-CM | POA: Diagnosis present

## 2020-12-22 DIAGNOSIS — D701 Agranulocytosis secondary to cancer chemotherapy: Secondary | ICD-10-CM | POA: Diagnosis not present

## 2020-12-22 DIAGNOSIS — K208 Other esophagitis without bleeding: Secondary | ICD-10-CM | POA: Diagnosis present

## 2020-12-22 DIAGNOSIS — T451X5A Adverse effect of antineoplastic and immunosuppressive drugs, initial encounter: Secondary | ICD-10-CM | POA: Diagnosis not present

## 2020-12-22 DIAGNOSIS — I483 Typical atrial flutter: Secondary | ICD-10-CM

## 2020-12-22 DIAGNOSIS — I471 Supraventricular tachycardia: Secondary | ICD-10-CM | POA: Diagnosis present

## 2020-12-22 DIAGNOSIS — R748 Abnormal levels of other serum enzymes: Secondary | ICD-10-CM | POA: Diagnosis present

## 2020-12-22 DIAGNOSIS — J9811 Atelectasis: Secondary | ICD-10-CM | POA: Diagnosis present

## 2020-12-22 DIAGNOSIS — J90 Pleural effusion, not elsewhere classified: Secondary | ICD-10-CM | POA: Diagnosis present

## 2020-12-22 DIAGNOSIS — E871 Hypo-osmolality and hyponatremia: Secondary | ICD-10-CM | POA: Diagnosis present

## 2020-12-22 DIAGNOSIS — D649 Anemia, unspecified: Secondary | ICD-10-CM

## 2020-12-22 DIAGNOSIS — E669 Obesity, unspecified: Secondary | ICD-10-CM | POA: Diagnosis present

## 2020-12-22 DIAGNOSIS — J439 Emphysema, unspecified: Secondary | ICD-10-CM | POA: Diagnosis present

## 2020-12-22 DIAGNOSIS — I4891 Unspecified atrial fibrillation: Secondary | ICD-10-CM | POA: Diagnosis not present

## 2020-12-22 DIAGNOSIS — E785 Hyperlipidemia, unspecified: Secondary | ICD-10-CM | POA: Diagnosis not present

## 2020-12-22 DIAGNOSIS — I480 Paroxysmal atrial fibrillation: Secondary | ICD-10-CM | POA: Diagnosis not present

## 2020-12-22 DIAGNOSIS — C799 Secondary malignant neoplasm of unspecified site: Secondary | ICD-10-CM | POA: Diagnosis present

## 2020-12-22 DIAGNOSIS — R5383 Other fatigue: Secondary | ICD-10-CM | POA: Diagnosis not present

## 2020-12-22 DIAGNOSIS — Z66 Do not resuscitate: Secondary | ICD-10-CM | POA: Diagnosis present

## 2020-12-22 DIAGNOSIS — I4819 Other persistent atrial fibrillation: Secondary | ICD-10-CM | POA: Diagnosis not present

## 2020-12-22 DIAGNOSIS — I700 Atherosclerosis of aorta: Secondary | ICD-10-CM | POA: Diagnosis not present

## 2020-12-22 DIAGNOSIS — D61818 Other pancytopenia: Secondary | ICD-10-CM | POA: Diagnosis not present

## 2020-12-22 LAB — PREPARE RBC (CROSSMATCH)

## 2020-12-22 LAB — ABO/RH: ABO/RH(D): O POS

## 2020-12-22 MED ORDER — DIPHENHYDRAMINE HCL 25 MG PO CAPS
25.0000 mg | ORAL_CAPSULE | Freq: Once | ORAL | Status: AC
  Administered 2020-12-22: 25 mg via ORAL
  Filled 2020-12-22: qty 1

## 2020-12-22 MED ORDER — ACETAMINOPHEN 325 MG PO TABS
650.0000 mg | ORAL_TABLET | Freq: Once | ORAL | Status: AC
  Administered 2020-12-22: 650 mg via ORAL
  Filled 2020-12-22: qty 2

## 2020-12-22 MED ORDER — SODIUM CHLORIDE 0.9% IV SOLUTION
250.0000 mL | Freq: Once | INTRAVENOUS | Status: AC
  Administered 2020-12-22: 250 mL via INTRAVENOUS

## 2020-12-22 NOTE — Patient Instructions (Signed)
Blood Transfusion, Adult, Care After This sheet gives you information about how to care for yourself after your procedure. Your doctor may also give you more specific instructions. If you have problems or questions, contact your doctor. What can I expect after the procedure? After the procedure, it is common to have: Bruising and soreness at the IV site. A headache. Follow these instructions at home: Insertion site care   Follow instructions from your doctor about how to take care of your insertion site. This is where an IV tube was put into your vein. Make sure you: Wash your hands with soap and water before and after you change your bandage (dressing). If you cannot use soap and water, use hand sanitizer. Change your bandage as told by your doctor. Check your insertion site every day for signs of infection. Check for: Redness, swelling, or pain. Bleeding from the site. Warmth. Pus or a bad smell. General instructions Take over-the-counter and prescription medicines only as told by your doctor. Rest as told by your doctor. Go back to your normal activities as told by your doctor. Keep all follow-up visits as told by your doctor. This is important. Contact a doctor if: You have itching or red, swollen areas of skin (hives). You feel worried or nervous (anxious). You feel weak after doing your normal activities. You have redness, swelling, warmth, or pain around the insertion site. You have blood coming from the insertion site, and the blood does not stop with pressure. You have pus or a bad smell coming from the insertion site. Get help right away if: You have signs of a serious reaction. This may be coming from an allergy or the body's defense system (immune system). Signs include: Trouble breathing or shortness of breath. Swelling of the face or feeling warm (flushed). Fever or chills. Head, chest, or back pain. Dark pee (urine) or blood in the pee. Widespread rash. Fast  heartbeat. Feeling dizzy or light-headed. You may receive your blood transfusion in an outpatient setting. If so, you will be told whom to contact to report any reactions. These symptoms may be an emergency. Do not wait to see if the symptoms will go away. Get medical help right away. Call your local emergency services (911 in the [place] Do not drive yourself to the hospital. Summary Bruising and soreness at the IV site are common. Check your insertion site every day for signs of infection. Rest as told by your doctor. Go back to your normal activities as told by your doctor. Get help right away if you have signs of a serious reaction. This information is not intended to replace advice given to you by your health care provider. Make sure you discuss any questions you have with your health care provider. Document Revised: 04/21/2020 Document Reviewed: 06/19/2018 Elsevier Patient Education  2022 Elsevier Inc.  

## 2020-12-22 NOTE — Patient Instructions (Addendum)
Medication Instructions:  Your physician recommends that you continue on your current medications as directed. Please refer to the Current Medication list given to you today. *If you need a refill on your cardiac medications before your next appointment, please call your pharmacy*  Lab Work: None. If you have labs (blood work) drawn today and your tests are completely normal, you will receive your results only by: [place] (if you have MyChart) OR A paper copy in the mail If you have any lab test that is abnormal or we need to change your treatment, we will call you to review the results.  Testing/Procedures: None.  Follow-Up: At [facility], you and your health needs are our priority.  As part of our continuing mission to provide you with exceptional heart care, we have created designated Provider Care Teams.  These Care Teams include your primary Cardiologist (physician) and Advanced Practice Providers (APPs -  Physician Assistants and Nurse Practitioners) who all work together to provide you with the care you need, when you need it.  Your physician wants you to follow-up in: 01/06/21 at 10:55 am one of the following Advanced Practice Providers on your designated Care Team:    Tommye Standard, [place]   We recommend signing up for the patient portal called "MyChart".  Sign up information is provided on this After Visit Summary.  MyChart is used to connect with patients for Virtual Visits (Telemedicine).  Patients are able to view lab/test results, encounter notes, upcoming appointments, etc.  Non-urgent messages can be sent to your provider as well.   To learn more about what you can do with MyChart, go to [email or web address].    Any Other Special Instructions Will Be Listed Below (If Applicable).

## 2020-12-22 NOTE — Progress Notes (Signed)
Reason for visit: Flecainide increase  Name of MD requesting visit: Tommye Standard   H&P: 1. 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 States I can not tell when my heart is out of rhythm.   Assessment and plan per MD: DOD Johney Frame reviewed EKG. Continue on same medication. Will forward visit to Tommye Standard for further advisement if needed.

## 2020-12-23 ENCOUNTER — Other Ambulatory Visit: Payer: Self-pay | Admitting: *Deleted

## 2020-12-23 ENCOUNTER — Telehealth: Payer: Self-pay | Admitting: *Deleted

## 2020-12-23 ENCOUNTER — Inpatient Hospital Stay: Payer: Medicare HMO

## 2020-12-23 ENCOUNTER — Ambulatory Visit: Payer: Medicare HMO

## 2020-12-23 ENCOUNTER — Encounter: Payer: Self-pay | Admitting: *Deleted

## 2020-12-23 ENCOUNTER — Other Ambulatory Visit: Payer: Self-pay

## 2020-12-23 DIAGNOSIS — D649 Anemia, unspecified: Secondary | ICD-10-CM

## 2020-12-23 LAB — PREPARE RBC (CROSSMATCH)

## 2020-12-23 MED ORDER — DIPHENHYDRAMINE HCL 25 MG PO CAPS: 25.0000 mg | ORAL_CAPSULE | Freq: Once | ORAL | Status: DC

## 2020-12-23 MED ORDER — DIPHENHYDRAMINE HCL 25 MG PO CAPS
25.0000 mg | ORAL_CAPSULE | Freq: Once | ORAL | Status: AC
  Administered 2020-12-23: 25 mg via ORAL
  Filled 2020-12-23: qty 1

## 2020-12-23 MED ORDER — METOPROLOL TARTRATE 100 MG PO TABS: 100.0000 mg | ORAL_TABLET | Freq: Two times a day (BID) | ORAL | 1 refills | Status: DC

## 2020-12-23 MED ORDER — SODIUM CHLORIDE 0.9% IV SOLUTION: 250.0000 mL | Freq: Once | INTRAVENOUS | Status: DC

## 2020-12-23 MED ORDER — ACETAMINOPHEN 325 MG PO TABS
650.0000 mg | ORAL_TABLET | Freq: Once | ORAL | Status: AC
  Administered 2020-12-23: 650 mg via ORAL
  Filled 2020-12-23: qty 2

## 2020-12-23 MED ORDER — ACETAMINOPHEN 325 MG PO TABS: 650.0000 mg | ORAL_TABLET | Freq: Once | ORAL | Status: DC

## 2020-12-23 MED ORDER — SODIUM CHLORIDE 0.9 % IV SOLN: Freq: Once | INTRAVENOUS | Status: AC

## 2020-12-23 NOTE — Telephone Encounter (Signed)
Lvm on home and cell number to call directly to me [phone] to discuss recommendations from EKG visit.

## 2020-12-23 NOTE — Patient Instructions (Signed)
Blood Transfusion, Adult, Care After This sheet gives you information about how to care for yourself after your procedure. Your doctor may also give you more specific instructions. If you have problems or questions, contact your doctor. What can I expect after the procedure? After the procedure, it is common to have: Bruising and soreness at the IV site. A headache. Follow these instructions at home: Insertion site care   Follow instructions from your doctor about how to take care of your insertion site. This is where an IV tube was put into your vein. Make sure you: Wash your hands with soap and water before and after you change your bandage (dressing). If you cannot use soap and water, use hand sanitizer. Change your bandage as told by your doctor. Check your insertion site every day for signs of infection. Check for: Redness, swelling, or pain. Bleeding from the site. Warmth. Pus or a bad smell. General instructions Take over-the-counter and prescription medicines only as told by your doctor. Rest as told by your doctor. Go back to your normal activities as told by your doctor. Keep all follow-up visits as told by your doctor. This is important. Contact a doctor if: You have itching or red, swollen areas of skin (hives). You feel worried or nervous (anxious). You feel weak after doing your normal activities. You have redness, swelling, warmth, or pain around the insertion site. You have blood coming from the insertion site, and the blood does not stop with pressure. You have pus or a bad smell coming from the insertion site. Get help right away if: You have signs of a serious reaction. This may be coming from an allergy or the body's defense system (immune system). Signs include: Trouble breathing or shortness of breath. Swelling of the face or feeling warm (flushed). Fever or chills. Head, chest, or back pain. Dark pee (urine) or blood in the pee. Widespread rash. Fast  heartbeat. Feeling dizzy or light-headed. You may receive your blood transfusion in an outpatient setting. If so, you will be told whom to contact to report any reactions. These symptoms may be an emergency. Do not wait to see if the symptoms will go away. Get medical help right away. Call your local emergency services (911 in the [place] Do not drive yourself to the hospital. Summary Bruising and soreness at the IV site are common. Check your insertion site every day for signs of infection. Rest as told by your doctor. Go back to your normal activities as told by your doctor. Get help right away if you have signs of a serious reaction. This information is not intended to replace advice given to you by your health care provider. Make sure you discuss any questions you have with your health care provider. Document Revised: 04/21/2020 Document Reviewed: 06/19/2018 Elsevier Patient Education  2022 Elsevier Inc.  

## 2020-12-25 ENCOUNTER — Inpatient Hospital Stay (HOSPITAL_COMMUNITY)
Admission: EM | Admit: 2020-12-25 | Discharge: 2021-01-03 | DRG: 808 | Disposition: A | Discharge status: Home Health | Payer: Medicare HMO | Attending: Internal Medicine | Admitting: Internal Medicine

## 2020-12-25 ENCOUNTER — Other Ambulatory Visit: Payer: Self-pay

## 2020-12-25 ENCOUNTER — Encounter (HOSPITAL_COMMUNITY): Payer: Self-pay | Admitting: Emergency Medicine

## 2020-12-25 ENCOUNTER — Emergency Department (HOSPITAL_COMMUNITY): Payer: Medicare HMO

## 2020-12-25 DIAGNOSIS — K209 Esophagitis, unspecified without bleeding: Secondary | ICD-10-CM | POA: Diagnosis not present

## 2020-12-25 DIAGNOSIS — T451X5A Adverse effect of antineoplastic and immunosuppressive drugs, initial encounter: Secondary | ICD-10-CM | POA: Diagnosis not present

## 2020-12-25 DIAGNOSIS — Y842 Radiological procedure and radiotherapy as the cause of abnormal reaction of the patient, or of later complication, without mention of misadventure at the time of the procedure: Secondary | ICD-10-CM | POA: Diagnosis present

## 2020-12-25 DIAGNOSIS — Z8673 Personal history of transient ischemic attack (TIA), and cerebral infarction without residual deficits: Secondary | ICD-10-CM

## 2020-12-25 DIAGNOSIS — J439 Emphysema, unspecified: Secondary | ICD-10-CM | POA: Diagnosis present

## 2020-12-25 DIAGNOSIS — F419 Anxiety disorder, unspecified: Secondary | ICD-10-CM | POA: Diagnosis present

## 2020-12-25 DIAGNOSIS — Z20822 Contact with and (suspected) exposure to covid-19: Secondary | ICD-10-CM | POA: Diagnosis present

## 2020-12-25 DIAGNOSIS — E871 Hypo-osmolality and hyponatremia: Secondary | ICD-10-CM | POA: Diagnosis present

## 2020-12-25 DIAGNOSIS — A6009 Herpesviral infection of other urogenital tract: Secondary | ICD-10-CM | POA: Diagnosis present

## 2020-12-25 DIAGNOSIS — R197 Diarrhea, unspecified: Secondary | ICD-10-CM | POA: Diagnosis present

## 2020-12-25 DIAGNOSIS — J9811 Atelectasis: Secondary | ICD-10-CM | POA: Diagnosis present

## 2020-12-25 DIAGNOSIS — E785 Hyperlipidemia, unspecified: Secondary | ICD-10-CM | POA: Diagnosis not present

## 2020-12-25 DIAGNOSIS — Z803 Family history of malignant neoplasm of breast: Secondary | ICD-10-CM

## 2020-12-25 DIAGNOSIS — D630 Anemia in neoplastic disease: Secondary | ICD-10-CM | POA: Diagnosis present

## 2020-12-25 DIAGNOSIS — R748 Abnormal levels of other serum enzymes: Secondary | ICD-10-CM | POA: Diagnosis present

## 2020-12-25 DIAGNOSIS — E669 Obesity, unspecified: Secondary | ICD-10-CM | POA: Diagnosis present

## 2020-12-25 DIAGNOSIS — G8929 Other chronic pain: Secondary | ICD-10-CM | POA: Diagnosis not present

## 2020-12-25 DIAGNOSIS — N390 Urinary tract infection, site not specified: Secondary | ICD-10-CM

## 2020-12-25 DIAGNOSIS — D61810 Antineoplastic chemotherapy induced pancytopenia: Secondary | ICD-10-CM | POA: Diagnosis not present

## 2020-12-25 DIAGNOSIS — C799 Secondary malignant neoplasm of unspecified site: Secondary | ICD-10-CM | POA: Diagnosis present

## 2020-12-25 DIAGNOSIS — Z6830 Body mass index (BMI) 30.0-30.9, adult: Secondary | ICD-10-CM

## 2020-12-25 DIAGNOSIS — D61818 Other pancytopenia: Secondary | ICD-10-CM | POA: Diagnosis not present

## 2020-12-25 DIAGNOSIS — Z79899 Other long term (current) drug therapy: Secondary | ICD-10-CM

## 2020-12-25 DIAGNOSIS — Z66 Do not resuscitate: Secondary | ICD-10-CM | POA: Diagnosis present

## 2020-12-25 DIAGNOSIS — I4891 Unspecified atrial fibrillation: Secondary | ICD-10-CM | POA: Diagnosis not present

## 2020-12-25 DIAGNOSIS — F4321 Adjustment disorder with depressed mood: Secondary | ICD-10-CM | POA: Diagnosis present

## 2020-12-25 DIAGNOSIS — R509 Fever, unspecified: Secondary | ICD-10-CM | POA: Diagnosis not present

## 2020-12-25 DIAGNOSIS — R0609 Other forms of dyspnea: Secondary | ICD-10-CM | POA: Diagnosis not present

## 2020-12-25 DIAGNOSIS — I4892 Unspecified atrial flutter: Secondary | ICD-10-CM | POA: Diagnosis present

## 2020-12-25 DIAGNOSIS — G894 Chronic pain syndrome: Secondary | ICD-10-CM | POA: Diagnosis present

## 2020-12-25 DIAGNOSIS — D701 Agranulocytosis secondary to cancer chemotherapy: Secondary | ICD-10-CM | POA: Diagnosis not present

## 2020-12-25 DIAGNOSIS — I10 Essential (primary) hypertension: Secondary | ICD-10-CM | POA: Diagnosis present

## 2020-12-25 DIAGNOSIS — C3432 Malignant neoplasm of lower lobe, left bronchus or lung: Secondary | ICD-10-CM | POA: Diagnosis present

## 2020-12-25 DIAGNOSIS — I471 Supraventricular tachycardia: Secondary | ICD-10-CM | POA: Diagnosis present

## 2020-12-25 DIAGNOSIS — A6000 Herpesviral infection of urogenital system, unspecified: Secondary | ICD-10-CM

## 2020-12-25 DIAGNOSIS — R5383 Other fatigue: Secondary | ICD-10-CM | POA: Diagnosis not present

## 2020-12-25 DIAGNOSIS — Z87891 Personal history of nicotine dependence: Secondary | ICD-10-CM

## 2020-12-25 DIAGNOSIS — I480 Paroxysmal atrial fibrillation: Secondary | ICD-10-CM | POA: Diagnosis not present

## 2020-12-25 DIAGNOSIS — R319 Hematuria, unspecified: Secondary | ICD-10-CM | POA: Diagnosis present

## 2020-12-25 DIAGNOSIS — K208 Other esophagitis without bleeding: Secondary | ICD-10-CM | POA: Diagnosis present

## 2020-12-25 DIAGNOSIS — R040 Epistaxis: Secondary | ICD-10-CM | POA: Diagnosis present

## 2020-12-25 DIAGNOSIS — I808 Phlebitis and thrombophlebitis of other sites: Secondary | ICD-10-CM | POA: Diagnosis not present

## 2020-12-25 DIAGNOSIS — J90 Pleural effusion, not elsewhere classified: Secondary | ICD-10-CM | POA: Diagnosis present

## 2020-12-25 DIAGNOSIS — J9601 Acute respiratory failure with hypoxia: Secondary | ICD-10-CM | POA: Diagnosis not present

## 2020-12-25 DIAGNOSIS — I4819 Other persistent atrial fibrillation: Secondary | ICD-10-CM | POA: Diagnosis not present

## 2020-12-25 DIAGNOSIS — C3490 Malignant neoplasm of unspecified part of unspecified bronchus or lung: Secondary | ICD-10-CM | POA: Diagnosis not present

## 2020-12-25 DIAGNOSIS — D6959 Other secondary thrombocytopenia: Secondary | ICD-10-CM | POA: Diagnosis present

## 2020-12-25 DIAGNOSIS — I440 Atrioventricular block, first degree: Secondary | ICD-10-CM | POA: Diagnosis present

## 2020-12-25 DIAGNOSIS — R0600 Dyspnea, unspecified: Secondary | ICD-10-CM | POA: Diagnosis present

## 2020-12-25 DIAGNOSIS — R0602 Shortness of breath: Secondary | ICD-10-CM | POA: Diagnosis not present

## 2020-12-25 DIAGNOSIS — K219 Gastro-esophageal reflux disease without esophagitis: Secondary | ICD-10-CM | POA: Diagnosis present

## 2020-12-25 LAB — CBC WITH DIFFERENTIAL/PLATELET
Abs Immature Granulocytes: 0.01 10*3/uL (ref 0.00–0.07)
Basophils Absolute: 0 10*3/uL (ref 0.0–0.1)
Basophils Relative: 1 %
Eosinophils Absolute: 0 10*3/uL (ref 0.0–0.5)
Eosinophils Relative: 0 %
HCT: 23.8 % — ABNORMAL LOW (ref 36.0–46.0)
Hemoglobin: 8 g/dL — ABNORMAL LOW (ref 12.0–15.0)
Immature Granulocytes: 1 %
Lymphocytes Relative: 23 %
Lymphs Abs: 0.2 10*3/uL — ABNORMAL LOW (ref 0.7–4.0)
MCH: 33.2 pg (ref 26.0–34.0)
MCHC: 33.6 g/dL (ref 30.0–36.0)
MCV: 98.8 fL (ref 80.0–100.0)
Monocytes Absolute: 0.1 10*3/uL (ref 0.1–1.0)
Monocytes Relative: 15 %
Neutro Abs: 0.4 10*3/uL — CL (ref 1.7–7.7)
Neutrophils Relative %: 60 %
Platelets: 27 10*3/uL — CL (ref 150–400)
RBC: 2.41 MIL/uL — ABNORMAL LOW (ref 3.87–5.11)
RDW: 17.3 % — ABNORMAL HIGH (ref 11.5–15.5)
WBC: 0.7 10*3/uL — CL (ref 4.0–10.5)
nRBC: 0 % (ref 0.0–0.2)

## 2020-12-25 LAB — COMPREHENSIVE METABOLIC PANEL
ALT: 54 U/L — ABNORMAL HIGH (ref 0–44)
AST: 50 U/L — ABNORMAL HIGH (ref 15–41)
Albumin: 3.1 g/dL — ABNORMAL LOW (ref 3.5–5.0)
Alkaline Phosphatase: 105 U/L (ref 38–126)
Anion gap: 9 (ref 5–15)
BUN: 23 mg/dL (ref 8–23)
CO2: 26 mmol/L (ref 22–32)
Calcium: 8 mg/dL — ABNORMAL LOW (ref 8.9–10.3)
Chloride: 97 mmol/L — ABNORMAL LOW (ref 98–111)
Creatinine, Ser: 0.95 mg/dL (ref 0.44–1.00)
GFR, Estimated: >60 mL/min (ref >60)
Glucose, Bld: 119 mg/dL — ABNORMAL HIGH (ref 70–99)
Potassium: 4.3 mmol/L (ref 3.5–5.1)
Sodium: 132 mmol/L — ABNORMAL LOW (ref 135–145)
Total Bilirubin: 0.5 mg/dL (ref 0.3–1.2)
Total Protein: 5.6 g/dL — ABNORMAL LOW (ref 6.5–8.1)

## 2020-12-25 LAB — URINALYSIS, ROUTINE W REFLEX MICROSCOPIC
Bilirubin Urine: NEGATIVE
Bilirubin Urine: NEGATIVE
Glucose, UA: NEGATIVE mg/dL
Glucose, UA: NEGATIVE mg/dL
Hgb urine dipstick: NEGATIVE
Ketones, ur: NEGATIVE mg/dL
Ketones, ur: NEGATIVE mg/dL
Leukocytes,Ua: NEGATIVE
Nitrite: NEGATIVE
Nitrite: NEGATIVE
Protein, ur: 30 mg/dL — AB
Protein, ur: NEGATIVE mg/dL
Specific Gravity, Urine: >1.03 — ABNORMAL HIGH (ref 1.005–1.030)
Specific Gravity, Urine: 1.025 (ref 1.005–1.030)
pH: 6.5 (ref 5.0–8.0)
pH: 6 (ref 5.0–8.0)

## 2020-12-25 LAB — LACTIC ACID, PLASMA
Lactic Acid, Venous: 1.1 mmol/L (ref 0.5–1.9)
Lactic Acid, Venous: 1.8 mmol/L (ref 0.5–1.9)

## 2020-12-25 LAB — URINALYSIS, MICROSCOPIC (REFLEX): WBC, UA: >50 WBC/hpf (ref 0–5)

## 2020-12-25 LAB — PROTIME-INR
INR: 1.3 — ABNORMAL HIGH (ref 0.8–1.2)
Prothrombin Time: 16.3 seconds — ABNORMAL HIGH (ref 11.4–15.2)

## 2020-12-25 LAB — RESP PANEL BY RT-PCR (FLU A&B, COVID) ARPGX2
Influenza A by PCR: NEGATIVE
Influenza B by PCR: NEGATIVE
SARS Coronavirus 2 by RT PCR: NEGATIVE

## 2020-12-25 LAB — TROPONIN I (HIGH SENSITIVITY)
Troponin I (High Sensitivity): 11 ng/L (ref <18)
Troponin I (High Sensitivity): 11 ng/L (ref <18)

## 2020-12-25 LAB — CBG MONITORING, ED: Glucose-Capillary: 111 mg/dL — ABNORMAL HIGH (ref 70–99)

## 2020-12-25 MED ORDER — ACETAMINOPHEN 325 MG PO TABS: 650.0000 mg | ORAL_TABLET | Freq: Four times a day (QID) | ORAL | Status: DC | PRN

## 2020-12-25 MED ORDER — FLUCONAZOLE 100 MG PO TABS
200.0000 mg | ORAL_TABLET | Freq: Every day | ORAL | Status: AC
  Administered 2020-12-28 – 2021-01-01 (×4): 200 mg via ORAL
  Filled 2020-12-25 (×5): qty 2
  Filled 2020-12-25: qty 1
  Filled 2020-12-25: qty 2

## 2020-12-25 MED ORDER — HYOSCYAMINE SULFATE 0.125 MG PO TABS: 0.1250 mg | ORAL_TABLET | ORAL | Status: DC | PRN

## 2020-12-25 MED ORDER — LORATADINE 10 MG PO TABS
10.0000 mg | ORAL_TABLET | Freq: Every day | ORAL | Status: DC
  Administered 2020-12-25 – 2021-01-03 (×10): 10 mg via ORAL
  Filled 2020-12-25 (×10): qty 1

## 2020-12-25 MED ORDER — MORPHINE SULFATE (PF) 2 MG/ML IV SOLN: 2.0000 mg | INTRAVENOUS | Status: DC | PRN

## 2020-12-25 MED ORDER — ALBUTEROL SULFATE (2.5 MG/3ML) 0.083% IN NEBU
INHALATION_SOLUTION | RESPIRATORY_TRACT | Status: AC
  Administered 2020-12-25: 18:00:00 2.5 mg
  Filled 2020-12-25: qty 3

## 2020-12-25 MED ORDER — CARISOPRODOL 350 MG PO TABS
350.0000 mg | ORAL_TABLET | Freq: Three times a day (TID) | ORAL | Status: DC
  Administered 2020-12-25 – 2021-01-03 (×27): 350 mg via ORAL
  Filled 2020-12-25 (×30): qty 1

## 2020-12-25 MED ORDER — ACETAMINOPHEN 650 MG RE SUPP: 650.0000 mg | Freq: Four times a day (QID) | RECTAL | Status: DC | PRN

## 2020-12-25 MED ORDER — POLYETHYLENE GLYCOL 3350 17 G PO PACK
34.0000 g | PACK | Freq: Every morning | ORAL | Status: DC
  Administered 2020-12-29: 08:00:00 17 g via ORAL
  Administered 2020-12-30 – 2021-01-03 (×5): 34 g via ORAL
  Filled 2020-12-25 (×5): qty 2

## 2020-12-25 MED ORDER — MAGIC MOUTHWASH: 5.0000 mL | Freq: Four times a day (QID) | ORAL | Status: DC | PRN

## 2020-12-25 MED ORDER — CARBOXYMETHYLCELLULOSE SODIUM 0.5 % OP SOLN
1.0000 [drp] | Freq: Three times a day (TID) | OPHTHALMIC | Status: DC | PRN
Start: 2020-12-25 — End: 2020-12-25

## 2020-12-25 MED ORDER — TBO-FILGRASTIM 300 MCG/0.5ML ~~LOC~~ SOSY
300.0000 ug | PREFILLED_SYRINGE | Freq: Every day | SUBCUTANEOUS | Status: DC
  Administered 2020-12-26: 300 ug via SUBCUTANEOUS
  Filled 2020-12-25 (×2): qty 0.5

## 2020-12-25 MED ORDER — MAGNESIUM OXIDE -MG SUPPLEMENT 400 (240 MG) MG PO TABS: 400.0000 mg | ORAL_TABLET | Freq: Every day | ORAL | Status: DC

## 2020-12-25 MED ORDER — PROCHLORPERAZINE MALEATE 10 MG PO TABS
10.0000 mg | ORAL_TABLET | Freq: Four times a day (QID) | ORAL | Status: DC | PRN
  Filled 2020-12-25: qty 1

## 2020-12-25 MED ORDER — DOCUSATE SODIUM 100 MG PO CAPS
100.0000 mg | ORAL_CAPSULE | Freq: Two times a day (BID) | ORAL | Status: DC
  Administered 2020-12-28 – 2021-01-03 (×12): 100 mg via ORAL
  Filled 2020-12-25 (×17): qty 1

## 2020-12-25 MED ORDER — FLECAINIDE ACETATE 100 MG PO TABS
100.0000 mg | ORAL_TABLET | Freq: Two times a day (BID) | ORAL | Status: DC
  Administered 2020-12-26 (×3): 100 mg via ORAL
  Filled 2020-12-25 (×5): qty 1

## 2020-12-25 MED ORDER — VALACYCLOVIR HCL 500 MG PO TABS
1000.0000 mg | ORAL_TABLET | Freq: Two times a day (BID) | ORAL | Status: DC
  Administered 2020-12-25 – 2021-01-03 (×18): 1000 mg via ORAL
  Filled 2020-12-25 (×19): qty 2

## 2020-12-25 MED ORDER — SODIUM CHLORIDE 0.9 % IV SOLN: INTRAVENOUS | Status: DC

## 2020-12-25 MED ORDER — METOPROLOL TARTRATE 25 MG PO TABS
100.0000 mg | ORAL_TABLET | Freq: Two times a day (BID) | ORAL | Status: DC
  Administered 2020-12-26 (×2): 100 mg via ORAL
  Filled 2020-12-25 (×2): qty 4

## 2020-12-25 MED ORDER — ROSUVASTATIN CALCIUM 5 MG PO TABS
5.0000 mg | ORAL_TABLET | ORAL | Status: DC
  Administered 2020-12-26 – 2021-01-02 (×4): 5 mg via ORAL
  Filled 2020-12-25 (×3): qty 1

## 2020-12-25 MED ORDER — BISACODYL 5 MG PO TBEC: 5.0000 mg | DELAYED_RELEASE_TABLET | Freq: Every day | ORAL | Status: DC | PRN

## 2020-12-25 MED ORDER — LINACLOTIDE 145 MCG PO CAPS
145.0000 ug | ORAL_CAPSULE | Freq: Every day | ORAL | Status: DC
  Administered 2020-12-26: 16:00:00 145 ug via ORAL
  Filled 2020-12-25 (×11): qty 1

## 2020-12-25 MED ORDER — SODIUM CHLORIDE 0.9 % IV SOLN
2.0000 g | Freq: Once | INTRAVENOUS | Status: AC
  Administered 2020-12-25: 18:00:00 2 g via INTRAVENOUS
  Filled 2020-12-25: qty 20

## 2020-12-25 MED ORDER — ARFORMOTEROL TARTRATE 15 MCG/2ML IN NEBU
15.0000 ug | INHALATION_SOLUTION | Freq: Two times a day (BID) | RESPIRATORY_TRACT | Status: DC
  Administered 2020-12-25 – 2020-12-27 (×4): 15 ug via RESPIRATORY_TRACT
  Filled 2020-12-25 (×7): qty 2

## 2020-12-25 MED ORDER — ONDANSETRON HCL 4 MG/2ML IJ SOLN: 4.0000 mg | Freq: Four times a day (QID) | INTRAMUSCULAR | Status: DC | PRN

## 2020-12-25 MED ORDER — LIDOCAINE VISCOUS HCL 2 % MT SOLN
15.0000 mL | Freq: Four times a day (QID) | OROMUCOSAL | Status: DC | PRN
  Filled 2020-12-25: qty 15

## 2020-12-25 MED ORDER — TRAMADOL HCL 50 MG PO TABS: 100.0000 mg | ORAL_TABLET | ORAL | Status: DC | PRN

## 2020-12-25 MED ORDER — PANTOPRAZOLE SODIUM 40 MG PO TBEC
40.0000 mg | DELAYED_RELEASE_TABLET | Freq: Every day | ORAL | Status: DC
  Administered 2020-12-26 – 2021-01-03 (×9): 40 mg via ORAL
  Filled 2020-12-25 (×9): qty 1

## 2020-12-25 MED ORDER — SODIUM CHLORIDE 0.9% FLUSH
3.0000 mL | Freq: Once | INTRAVENOUS | Status: AC
  Administered 2020-12-25: 12:00:00 3 mL via INTRAVENOUS

## 2020-12-25 MED ORDER — ONDANSETRON HCL 4 MG PO TABS: 4.0000 mg | ORAL_TABLET | Freq: Four times a day (QID) | ORAL | Status: DC | PRN

## 2020-12-25 MED ORDER — UMECLIDINIUM BROMIDE 62.5 MCG/ACT IN AEPB
1.0000 | INHALATION_SPRAY | Freq: Every day | RESPIRATORY_TRACT | Status: DC
  Administered 2020-12-25 – 2020-12-27 (×3): 1 via RESPIRATORY_TRACT
  Filled 2020-12-25: qty 7

## 2020-12-25 MED ORDER — VALACYCLOVIR HCL 500 MG PO TABS: 1000.0000 mg | ORAL_TABLET | Freq: Three times a day (TID) | ORAL | Status: DC

## 2020-12-25 MED ORDER — ZOLPIDEM TARTRATE 5 MG PO TABS
5.0000 mg | ORAL_TABLET | Freq: Every evening | ORAL | Status: DC | PRN
  Administered 2021-01-02: 22:00:00 5 mg via ORAL
  Filled 2020-12-25: qty 1

## 2020-12-25 MED ORDER — LACTATED RINGERS IV BOLUS
1000.0000 mL | Freq: Once | INTRAVENOUS | Status: AC
  Administered 2020-12-25: 15:00:00 1000 mL via INTRAVENOUS

## 2020-12-25 MED ORDER — ALBUTEROL SULFATE (2.5 MG/3ML) 0.083% IN NEBU
2.5000 mg | INHALATION_SOLUTION | RESPIRATORY_TRACT | Status: DC | PRN
  Administered 2020-12-26 (×3): 2.5 mg via RESPIRATORY_TRACT
  Filled 2020-12-25 (×3): qty 3

## 2020-12-25 MED ORDER — HYDRALAZINE HCL 20 MG/ML IJ SOLN: 5.0000 mg | INTRAMUSCULAR | Status: DC | PRN

## 2020-12-25 MED ORDER — CLONAZEPAM 0.5 MG PO TABS
1.0000 mg | ORAL_TABLET | Freq: Every day | ORAL | Status: DC
  Administered 2020-12-26: 1 mg via ORAL
  Filled 2020-12-25: qty 2

## 2020-12-25 MED ORDER — APIXABAN 5 MG PO TABS
5.0000 mg | ORAL_TABLET | Freq: Two times a day (BID) | ORAL | Status: DC
  Administered 2020-12-26 (×2): 5 mg via ORAL
  Filled 2020-12-25 (×2): qty 1

## 2020-12-25 MED ORDER — TRAMADOL HCL 50 MG PO TABS
100.0000 mg | ORAL_TABLET | Freq: Three times a day (TID) | ORAL | Status: DC
  Administered 2020-12-26 – 2021-01-03 (×22): 100 mg via ORAL
  Filled 2020-12-25 (×27): qty 2

## 2020-12-25 MED ORDER — LACTATED RINGERS IV BOLUS
500.0000 mL | Freq: Once | INTRAVENOUS | Status: AC
  Administered 2020-12-25: 12:00:00 500 mL via INTRAVENOUS

## 2020-12-25 MED ORDER — HYDROCODONE-ACETAMINOPHEN 7.5-325 MG PO TABS
1.0000 | ORAL_TABLET | Freq: Four times a day (QID) | ORAL | Status: DC | PRN
  Administered 2020-12-25: 20:00:00 1 via ORAL
  Filled 2020-12-25: qty 1

## 2020-12-25 MED ORDER — ESCITALOPRAM OXALATE 10 MG PO TABS
10.0000 mg | ORAL_TABLET | Freq: Every day | ORAL | Status: DC
Start: 2020-12-25 — End: 2021-01-03
  Administered 2020-12-25 – 2021-01-03 (×10): 10 mg via ORAL
  Filled 2020-12-25 (×10): qty 1

## 2020-12-25 MED ORDER — MOMETASONE FURO-FORMOTEROL FUM 100-5 MCG/ACT IN AERO
2.0000 | INHALATION_SPRAY | Freq: Two times a day (BID) | RESPIRATORY_TRACT | Status: DC
  Administered 2020-12-26 (×2): 2 via RESPIRATORY_TRACT
  Filled 2020-12-25: qty 8.8

## 2020-12-25 MED ORDER — POLYVINYL ALCOHOL 1.4 % OP SOLN: 1.0000 [drp] | OPHTHALMIC | Status: DC | PRN

## 2020-12-25 MED ORDER — SODIUM CHLORIDE 1 G PO TABS
1.0000 g | ORAL_TABLET | Freq: Three times a day (TID) | ORAL | Status: DC
  Administered 2020-12-26 – 2021-01-03 (×27): 1 g via ORAL
  Filled 2020-12-25 (×31): qty 1

## 2020-12-25 MED ORDER — GABAPENTIN 600 MG PO TABS
600.0000 mg | ORAL_TABLET | Freq: Two times a day (BID) | ORAL | Status: DC
  Administered 2020-12-25 – 2020-12-27 (×5): 600 mg via ORAL
  Filled 2020-12-25 (×9): qty 1

## 2020-12-25 NOTE — H&P (Signed)
History and Physical    Patricia [name] DJM:[id number] DOB: Feb 25, 1950 DOA: 12/25/2020  PCP: [name], MD Consultants:  Julien Nordmann - oncology; Tammi Klippel - rad onc; [facility]- cardiology; [facility] - pulmonology Patient coming from:  Home - lives with husband; NOK: Husband, [phone]  Chief Complaint: SOB  HPI: Patricia [name] is a 70 y.o. female with medical history significant of HTN; HLD; aflutter scheduled for cardioversion next month; lung cancer (10/2020); and low back pain presenting with fatigue, SOB. She has been feeling weak, difficulty walking, hurting all over in back and chest.  "They burned my esophagus with the radiation".  She hasn't been able to eat/drink much without numbing medicine in her throat.  Difficulty breathing with movement.  She also has difficulty breathing during the day if she moves around a lot.  She was diagnosed in October and has had 9 treatments of chemo and 15-16 treatments of radiation; she has 3 more chemo treatments and more rads treatments through early January.  The cancer appears to be responding significantly.  She feels like the chemo/rads is just making her feel terrible.  She had a survey from [facility] that called her Friday and her depression screen was very positive.  She feels little but otherwise has no urinary symptoms.  She has had esophagitis, "it's just been raw" - she couldn't breathe or swallow well.  She got 2 days off radiation Thursday and Friday to give her a 4-day weekend to recover.  She is taking Lidocaine.  Carafate was nasty, pills made her sick, she has been taking Nystatin.  She got transfusions Thursday and Friday, couldn't tell a difference.  She has been getting Neupogen.   ED Course: Cancer, feeling weak.  Now with pancytopenia and has UTI.  Repeating UA with catheterization. Oncology notified, not planning to see currently.  Review of Systems: As per HPI; otherwise review of systems reviewed and negative.   Ambulatory Status:   Ambulates without assistance  COVID Vaccine Status:  Complete plus boosters  Past Medical History:  Diagnosis Date   Anxiety    GERD (gastroesophageal reflux disease)    Hyperlipidemia    Hypertension    IBS (irritable bowel syndrome)    Insomnia    Low back pain    Lung cancer ([place]) 10/2020   TIA (transient ischemic attack) 10/2017   no deficits   Vitamin B12 deficiency    Vitamin D deficiency     Past Surgical History:  Procedure Laterality Date   BACK SURGERY     BREAST EXCISIONAL BIOPSY Left 1997   benign   BRONCHIAL BIOPSY  10/18/2020   Procedure: BRONCHIAL BIOPSIES;  Surgeon: [name], DO;  Location: [place] [facility];  Service: Pulmonary;;   BRONCHIAL BRUSHINGS  10/18/2020   Procedure: BRONCHIAL BRUSHINGS;  Surgeon: [name], DO;  Location: [facility];  Service: Pulmonary;;   BRONCHIAL NEEDLE ASPIRATION BIOPSY  10/18/2020   Procedure: BRONCHIAL NEEDLE ASPIRATION BIOPSIES;  Surgeon: [name], DO;  Location: [place] [facility];  Service: Pulmonary;;   BRONCHIAL WASHINGS  10/18/2020   Procedure: BRONCHIAL WASHINGS;  Surgeon: [name], DO;  Location: [place] [facility];  Service: Pulmonary;;   hemorrhoidecotmy     HERNIA MESH REMOVAL     OPEN REDUCTION INTERNAL FIXATION (ORIF) DISTAL RADIAL FRACTURE Right 11/19/2019   Procedure: OPEN REDUCTION INTERNAL FIXATION (ORIF) DISTAL RADIUS AND ULNA FRACTURE WITH REPAIR AS NECESSARY;  Surgeon: Roseanne Kaufman, MD;  Location: [facility];  Service: Orthopedics;  Laterality: Right;  2  hrs Block with IV sedation   ORIF RADIUS & ULNA FRACTURES     TONSILLECTOMY     VIDEO BRONCHOSCOPY WITH ENDOBRONCHIAL NAVIGATION Left 10/18/2020   Procedure: VIDEO BRONCHOSCOPY WITH ENDOBRONCHIAL NAVIGATION;  Surgeon: [name], DO;  Location: [facility] [place];  Service: Pulmonary;  Laterality: Left;  ION   VIDEO BRONCHOSCOPY WITH ENDOBRONCHIAL ULTRASOUND Bilateral 10/18/2020   Procedure: VIDEO BRONCHOSCOPY WITH ENDOBRONCHIAL ULTRASOUND;   Surgeon: [name], DO;  Location: [place];  Service: Pulmonary;  Laterality: Bilateral;   VIDEO BRONCHOSCOPY WITH RADIAL ENDOBRONCHIAL ULTRASOUND  10/18/2020   Procedure: VIDEO BRONCHOSCOPY WITH RADIAL ENDOBRONCHIAL ULTRASOUND;  Surgeon: [name], DO;  Location: [facility];  Service: Pulmonary;;    Social History   Socioeconomic History   Marital status: Married    Spouse name: Not on file   Number of children: Not on file   Years of education: Not on file   Highest education level: Not on file  Occupational History   Not on file  Tobacco Use   Smoking status: Former    Packs/day: 1.00    Years: 50.00    Pack years: 50.00    Types: Cigarettes    Start date: 75    Quit date: 12/14/2019    Years since quitting: 1.0   Smokeless tobacco: Never  Vaping Use   Vaping Use: Never used  Substance and Sexual Activity   Alcohol use: Not Currently    Comment: occ once every 2 months   Drug use: Never   Sexual activity: Not on file  Other Topics Concern   Not on file  Social History Narrative   Live with husband.  Education HS.  Retired.  Children 2 (daughter). 1 cup coffee.     Social Determinants of Health   Financial Resource Strain: Low Risk    Difficulty of Paying Living Expenses: Not hard at all  Food Insecurity: No Food Insecurity   Worried About Charity fundraiser in the Last Year: Never true   [place] in the Last Year: Never true  Transportation Needs: No Transportation Needs   Lack of Transportation (Medical): No   Lack of Transportation (Non-Medical): No  Physical Activity: Not on file  Stress: No Stress Concern Present   Feeling of Stress : Only a little  Social Connections: Engineer, building services of Communication with Friends and Family: More than three times a week   Frequency of Social Gatherings with Friends and Family: More than three times a week   Attends Religious Services: More than 4 times per year   Active  Member of [employer] or Organizations: Yes   Attends Music therapist: More than 4 times per year   Marital Status: Married  Human resources officer Violence: Not on file    Allergies  Allergen Reactions   Augmentin [Amoxicillin-Pot Clavulanate] Nausea Only and Other (See Comments)    GI upset   Levaquin [Levofloxacin In D5w] Other (See Comments)    Joint problems    Mom [Magnesium Hydroxide] Other (See Comments)    Welts    Oxycodone Other (See Comments)    Nausea     Family History  Problem Relation Age of Onset   Breast cancer Sister    Breast cancer Maternal Aunt    Breast cancer Paternal Aunt     Prior to Admission medications   Medication Sig Start Date End Date Taking? Authorizing Provider  acetaminophen (TYLENOL) 325 MG tablet 1 tablet as  needed.    [provider]  budesonide-formoterol (SYMBICORT) 80-4.5 MCG/ACT inhaler Take 2 puffs first thing in am and then another 2 puffs about 12 hours later. 08/11/20   Tanda Rockers, MD  carboxymethylcellulose (REFRESH PLUS) 0.5 % SOLN Place 1 drop into both eyes 3 (three) times daily as needed (for dryness).     [provider]  carisoprodol (SOMA) 350 MG tablet Take 350 mg by mouth 3 (three) times daily.  09/26/17   [provider]  Cholecalciferol (VITAMIN D3) 50 MCG (2000 UT) TABS Take 2,000 Units by mouth daily.    [provider]  clobetasol ointment (TEMOVATE) 8.41 % Apply 1 application topically See admin instructions. Apply to vaginal area daily as directed    [provider]  clonazePAM (KLONOPIN) 1 MG tablet Take 1 mg by mouth at bedtime.  10/15/17   [provider]  ELIQUIS 5 MG TABS tablet TAKE 1 TABLET(5 MG) BY MOUTH TWICE DAILY 10/03/20   Camnitz, Ocie Doyne, MD  flecainide (TAMBOCOR) 100 MG tablet Take 1 tablet (100 mg total) by mouth 2 (two) times daily. 12/20/20   Baldwin Jamaica, PA-C  fluconazole (DIFLUCAN) 100 MG tablet Take 2 tablets (200 mg total) by  mouth daily for 14 days. Take 400 mg on first day, then 200 mg per day 12/18/20 01/01/21  Sherwood Gambler, MD  fluticasone [facility]) 50 MCG/ACT nasal spray Place 2 sprays into both nostrils daily as needed for allergies or rhinitis.  10/11/17   [provider]  furosemide (LASIX) 20 MG tablet Take 1 tablet (20 mg total) by mouth daily. 10/20/20 01/18/21  Terrilee Croak, MD  gabapentin (NEURONTIN) 600 MG tablet Take 600 mg by mouth See admin instructions. Take 600 mg by mouth in the morning and at lunchtime 09/16/17   [provider]  HYDROcodone-acetaminophen (NORCO) 7.5-325 MG tablet Take 1 tablet by mouth every 6 (six) hours as needed for severe pain. 12/18/20   Sherwood Gambler, MD  hyoscyamine (LEVSIN) 0.125 MG tablet Take 0.125 mg by mouth as needed. 02/06/10   [provider]  lidocaine (XYLOCAINE) 2 % solution Use as directed 15 mLs in the mouth or throat every 6 (six) hours as needed for mouth pain. 12/18/20   Sherwood Gambler, MD  linaclotide [facility]) 145 MCG CAPS capsule Take 1 capsule (145 mcg total) by mouth daily at 4 PM. 10/03/20   Debbe Odea, MD  magic mouthwash SOLN Take 5 mLs by mouth 4 (four) times daily as needed for mouth pain. Pt is allergic to Magnesium 11/04/20   Walisiewicz, Kaitlyn E, PA-C  magnesium oxide (MAG-OX) 400 (240 Mg) MG tablet Take 1 tablet (400 mg total) by mouth daily. 11/08/20   Heilingoetter, Cassandra L, PA-C  metoprolol tartrate (LOPRESSOR) 100 MG tablet Take 1 tablet (100 mg total) by mouth 2 (two) times daily. 12/23/20   Baldwin Jamaica, PA-C  nystatin (MYCOSTATIN) 100000 UNIT/ML suspension Take 5 mLs (500,000 Units total) by mouth 4 (four) times daily. 12/16/20   Tyler Pita, MD  omeprazole (PRILOSEC) 20 MG capsule Take 20 mg by mouth daily before breakfast.  08/27/17   [provider]  ondansetron (ZOFRAN-ODT) 8 MG disintegrating tablet Take 8 mg by mouth 3 (three) times daily. 10/13/20   [provider]   polyethylene glycol (MIRALAX / GLYCOLAX) packet Take 34 g by mouth in the morning.     [provider]  potassium chloride SA (KLOR-CON) 20 MEQ tablet Take 1 tablet (20 mEq total)  by mouth daily. Patient not taking: Reported on 12/20/2020 11/08/20   Heilingoetter, Cassandra L, PA-C  prochlorperazine (COMPAZINE) 10 MG tablet Take 1 tablet (10 mg total) by mouth every 6 (six) hours as needed for nausea or vomiting. 10/27/20   Curt Bears, MD  rosuvastatin (CRESTOR) 5 MG tablet Take 5 mg by mouth 3 (three) times a week. Mondays Wednesdays Fridays    [provider]  sodium chloride 1 g tablet Take 1 g by mouth 3 (three) times daily. 11/26/20   [provider]  sucralfate (CARAFATE) 1 g tablet Take 1 tablet (1 g total) by mouth 4 (four) times daily. Dissolve each tablet in 15 cc water before use. Patient not taking: Reported on 12/20/2020 12/09/20   Kyung Rudd, MD  Tiotropium Bromide-Olodaterol 2.5-2.5 MCG/ACT AERS Inhale 2 puffs into the lungs daily. 11/16/20   Icard, Octavio Graves, DO  traMADol (ULTRAM) 50 MG tablet Take 100 mg by mouth as needed. 10/08/19   [provider]  VENTOLIN HFA 108 (90 Base) MCG/ACT inhaler Inhale 2 puffs into the lungs every 6 (six) hours as needed for wheezing or shortness of breath.  09/30/17   [provider]  zaleplon (SONATA) 10 MG capsule Take 10 mg by mouth at bedtime as needed (for interrupted sleep).     [provider]    Physical Exam: Vitals:   12/25/20 1226 12/25/20 1228 12/25/20 1400 12/25/20 1500  BP:  124/72 137/75 (!) 152/86  Pulse:  70 72 71  Resp:  17 (!) 22 (!) 24  Temp:  97.8 F (36.6 C)    TempSrc:  Oral    SpO2:  96% 96% 96%  Weight: 85.7 kg     Height: 5\' 6"  (1.676 m)        General:  Appears chronically ill, with alopecia Eyes:  PERRL, EOMI, normal lids, iris ENT:  grossly normal hearing, lips & tongue, mmm; appropriate dentition Neck:  no LAD, masses or thyromegaly Cardiovascular:   RRR, no m/r/g. No LE edema.  Respiratory:   Diffuse wheezes with poor air movement.  Normal O2 saturations on [place] O2 but with mildly increased respiratory effort and conversational dyspnea (was asking for O2 to be turned up despite sats 100%) Abdomen:  soft, NT, ND Skin:  no rash or induration seen on limited exam Musculoskeletal:  grossly normal tone BUE/BLE, good ROM, no bony abnormality Psychiatric:  flat mood and affect with some mood lability, speech fluent and appropriate, AOx3 Neurologic:  CN 2-12 grossly intact, moves all extremities in coordinated fashion    Radiological Exams on Admission: Independently reviewed - see discussion in A/P where applicable  DG Chest 2 View  Result Date: 12/25/2020 CLINICAL DATA:  Fatigue and shortness of breath. Lung cancer patient receiving chemotherapy and radiation therapy. EXAM: CHEST - 2 VIEW COMPARISON:  12/18/2020.  CT, 12/08/2020. FINDINGS: Cardiac silhouette is normal in size. No mediastinal or hilar masses or evidence of adenopathy. Mild streaky opacity at the left lung base consistent with atelectasis or scarring. Lungs otherwise clear. Relative lucency in the upper lobes consistent with emphysema. Minimal pleural effusions.  No pneumothorax. Chronic compression fracture of L2.  No acute skeletal abnormality. IMPRESSION: 1. No acute cardiopulmonary disease. Electronically Signed   By: Lajean Manes M.D.   On: 12/25/2020 12:18    EKG: Independently reviewed.  NSR with rate 72; 1st degree AV block with no evidence of acute ischemia   Labs on Admission: I have personally reviewed the available labs and  imaging studies at the time of the admission.  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)   Chemotherapy induced neutropenia (HCC)   Dyslipidemia   Genital HSV   Antineoplastic chemotherapy induced pancytopenia (HCC)   Esophagitis   Dyspnea with known lung cancer -Patient with limited stage SCLC diagnosed in 10/2020 -She is undergoing concurrent treatment with cisplatin, etoposide, and radiation - and is miserable -She is having respiratory difficulty including conversational and exertional dyspnea but has normal O2 sats -She has relatively poor air movement on exam and is wheezing -She was transfused for possible symptomatic anemia last week without improvement and her current Hgb is 8 -Chest CT on 12/2 showed marked response to treatment and underlying COPD -Some of her symptoms may be symptomatic from cancer treatments and some may be related to COPD (with contribution from depression, see below) -UTI is also a consideration (see below) -For now, will admit -Continue Symbicort (Dulera per formulary), Stiolto -Will add prn Albuterol nebs  Afib -Rate controlled with flecainide, metoprolol -Continue Eliquis  HTN -Continue metoprolol  HLD -Continue Crestor 3x/week  Adjustment d/o with depressed mood -She has a grossly positive depression screen -She recognizes that her mood may be contributing to her current physical manifestations -She is open to treatment - but wants something that will start working ASAP and with the understanding that she can wean off when things are better -Will start Lexapro 10 mg PO daily -Continue Klonopin, [place]  Genital HSV -She reports that she had outbreaks in the 1970s and none since, but the chemo has caused it to flare -She is having genital lesions that burn with urination -She has been using zinc oxide -Will start Valtrex 1 gram PO q12h and this should be continued for up to 10 days based on her  immunocompromise  Pancytopenia -Has neutropenia, clearly at risk for infection -No urinary symptoms but initial UA was concerning; she was given Rocephin x 1 in the ER and cath UA is pending so may continue treatment based on results -There was mention of filgrastim in oncology notes and she appears to be receiving these as needed, with last dose on 11/28 -According to Dr. Lorenso Courier, she would most benefit from peg-filagrastim (long-lasting) but this cannot be given inpatient -Will give daily filgrastim injections while hospitalized until [facility] >1.5 and give Claritin per Dr. Lorenso Courier to help with injection-associated bone pain  Esophagitis -She was started on Diflucan and nystatin; will hold Nystatin for now -Soft diet -Declines Carafate -Continue viscous lidocaine, magic mouthwash, Protonix (in lieu of prilosec) -Continue NaCl  Chronic pain -I have reviewed this patient in the [place] Controlled Substances Reporting System.  She is generally receiving medications from only one provider and appears to be taking them as prescribed. -She is at high risk of opioid misuse, diversion, or overdose.  -Chronic pain makes the pain receptors more sensitive and so her acute pain may be greater than for others -Continue home Soma, Neurontin, Norco, ultram -Will add morphine for breakthrough pain  Obesity -She is very upset about the weight she has gained since she stopped smoking and is concerned that SSRI  will cause this to worsen -Ongoing support for this issue is encouraged, but gently given her current fragility    Note: This patient has been tested and is pending for the novel coronavirus COVID-19. The patient has been fully vaccinated against COVID-19.   Level of care: Med-Surg DVT prophylaxis: Eliquis Code Status: DNR - confirmed with patient/family Family Communication: Husband was present throughout evaluation Disposition Plan:  The patient is from: home  Anticipated d/c is to: home without [facility]  services   Anticipated d/c date will depend on clinical response to treatment, likely several days  Patient is currently: acutely ill Consults called: Oncology by telephone  Admission status:  Admit - It is my clinical opinion that admission to INPATIENT is reasonable and necessary because of the expectation that this patient will require hospital care that crosses at least 2 midnights to treat this condition based on the medical complexity of the problems presented.  Given the aforementioned information, the predictability of an adverse outcome is felt to be significant.    Karmen Bongo MD [facility]   How to contact the [facility] Attending or Consulting provider [place] or covering provider during after hours [place], for this patient?  Check the care team in [facility] and look for a) attending/consulting [facility] provider listed and b) the [facility] team listed Log into [email or web address] and use [place] universal password to access. If you do not have the password, please contact the hospital operator. Locate the [facility] provider you are looking for under [facility] and page to a number that you can be directly reached. If you still have difficulty reaching the provider, please page the [facility] (Director on Call) for the Hospitalists listed on amion for assistance.   12/25/2020, 5:38 PM

## 2020-12-25 NOTE — ED Provider Notes (Signed)
[facility] EMERGENCY DEPARTMENT Provider Note   CSN: [id number] Arrival date & time: 12/25/20  1052     History Chief Complaint  Patient presents with   Weakness    Patricia [name] is a 70 y.o. female.  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  She presents today complaining of increasing weakness.  She reports some ongoing burning chest pain which has been present for several weeks and is constant in nature.  She associates this with prior esophagitis.  She denies any fever, productive cough, nausea, or recent vomiting.  She has she did vomit last weekend.  Ports that she has had to have 2 blood transfusions since her last chemo.  Denies any active bleeding.    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[place]) 10/21/2020   Lung nodule 10/18/2020   Anxiety    AF (paroxysmal atrial fibrillation) (HCC)    Paroxysmal SVT (supraventricular tachycardia) (HCC)    Chronic pain    Hyponatremia 10/01/2020   Essential hypertension 10/01/2020   Syncope 09/30/2020   Asthmatic bronchitis 08/11/2020   Colles' fracture of right radius, initial encounter for closed fracture 11/19/2019    Past Surgical History:  Procedure Laterality Date   BACK SURGERY     BREAST EXCISIONAL BIOPSY Left 1997   benign   BRONCHIAL BIOPSY  10/18/2020    Procedure: BRONCHIAL BIOPSIES;  Surgeon: [name], DO;  Location: [facility];  Service: Pulmonary;;   BRONCHIAL BRUSHINGS  10/18/2020   Procedure: BRONCHIAL BRUSHINGS;  Surgeon: [name], DO;  Location: [facility];  Service: Pulmonary;;   BRONCHIAL NEEDLE ASPIRATION BIOPSY  10/18/2020   Procedure: BRONCHIAL NEEDLE ASPIRATION BIOPSIES;  Surgeon: [name], DO;  Location: [place] [facility];  Service: Pulmonary;;   BRONCHIAL WASHINGS  10/18/2020   Procedure: BRONCHIAL WASHINGS;  Surgeon: [name], DO;  Location: [place];  Service: Pulmonary;;   hemorrhoidecotmy     HERNIA MESH REMOVAL     OPEN REDUCTION INTERNAL FIXATION (ORIF) DISTAL RADIAL FRACTURE Right 11/19/2019   Procedure: OPEN REDUCTION INTERNAL FIXATION (ORIF) DISTAL RADIUS AND ULNA FRACTURE WITH REPAIR AS NECESSARY;  Surgeon: Roseanne Kaufman, MD;  Location: [place];  Service: Orthopedics;  Laterality: Right;  2 hrs Block with IV sedation   ORIF RADIUS & ULNA FRACTURES     TONSILLECTOMY     VIDEO BRONCHOSCOPY WITH ENDOBRONCHIAL NAVIGATION Left 10/18/2020   Procedure: VIDEO BRONCHOSCOPY WITH ENDOBRONCHIAL NAVIGATION;  Surgeon: [name], DO;  Location: [place];  Service: Pulmonary;  Laterality: Left;  ION   VIDEO BRONCHOSCOPY WITH ENDOBRONCHIAL ULTRASOUND Bilateral 10/18/2020   Procedure: VIDEO BRONCHOSCOPY WITH ENDOBRONCHIAL ULTRASOUND;  Surgeon: [name], DO;  Location: [place];  Service: Pulmonary;  Laterality: Bilateral;   VIDEO BRONCHOSCOPY WITH RADIAL ENDOBRONCHIAL ULTRASOUND  10/18/2020  Procedure: VIDEO BRONCHOSCOPY WITH RADIAL ENDOBRONCHIAL ULTRASOUND;  Surgeon: [name], DO;  Location: [place] [facility];  Service: Pulmonary;;     OB History   No obstetric history on file.     Family History  Problem Relation Age of Onset   Breast cancer Sister    Breast cancer Maternal Aunt    Breast cancer Paternal Aunt     Social History   Tobacco Use   Smoking status:  Former    Packs/day: 1.00    Years: 50.00    Pack years: 50.00    Types: Cigarettes    Start date: 58    Quit date: 12/14/2019    Years since quitting: 1.0   Smokeless tobacco: Never  Vaping Use   Vaping Use: Never used  Substance Use Topics   Alcohol use: Not Currently    Comment: occ once every 2 months   Drug use: Never    Home Medications Prior to Admission medications   Medication Sig Start Date End Date Taking? Authorizing Provider  acetaminophen (TYLENOL) 325 MG tablet 1 tablet as needed.    [provider]  budesonide-formoterol (SYMBICORT) 80-4.5 MCG/ACT inhaler Take 2 puffs first thing in am and then another 2 puffs about 12 hours later. 08/11/20   Tanda Rockers, MD  carboxymethylcellulose (REFRESH PLUS) 0.5 % SOLN Place 1 drop into both eyes 3 (three) times daily as needed (for dryness).     [provider]  carisoprodol (SOMA) 350 MG tablet Take 350 mg by mouth 3 (three) times daily.  09/26/17   [provider]  Cholecalciferol (VITAMIN D3) 50 MCG (2000 UT) TABS Take 2,000 Units by mouth daily.    [provider]  clobetasol ointment (TEMOVATE) 0.99 % Apply 1 application topically See admin instructions. Apply to vaginal area daily as directed    [provider]  clonazePAM (KLONOPIN) 1 MG tablet Take 1 mg by mouth at bedtime.  10/15/17   [provider]  ELIQUIS 5 MG TABS tablet TAKE 1 TABLET(5 MG) BY MOUTH TWICE DAILY 10/03/20   Camnitz, Ocie Doyne, MD  flecainide (TAMBOCOR) 100 MG tablet Take 1 tablet (100 mg total) by mouth 2 (two) times daily. 12/20/20   Baldwin Jamaica, PA-C  fluconazole (DIFLUCAN) 100 MG tablet Take 2 tablets (200 mg total) by mouth daily for 14 days. Take 400 mg on first day, then 200 mg per day 12/18/20 01/01/21  Sherwood Gambler, MD  fluticasone [facility]) 50 MCG/ACT nasal spray Place 2 sprays into both nostrils daily as needed for allergies or rhinitis.  10/11/17   [provider]   furosemide (LASIX) 20 MG tablet Take 1 tablet (20 mg total) by mouth daily. 10/20/20 01/18/21  Terrilee Croak, MD  gabapentin (NEURONTIN) 600 MG tablet Take 600 mg by mouth See admin instructions. Take 600 mg by mouth in the morning and at lunchtime 09/16/17   [provider]  HYDROcodone-acetaminophen (NORCO) 7.5-325 MG tablet Take 1 tablet by mouth every 6 (six) hours as needed for severe pain. 12/18/20   Sherwood Gambler, MD  hyoscyamine (LEVSIN) 0.125 MG tablet Take 0.125 mg by mouth as needed. 02/06/10   [provider]  lidocaine (XYLOCAINE) 2 % solution Use as directed 15 mLs in the mouth or throat every 6 (six) hours as needed for mouth pain. 12/18/20   Sherwood Gambler, MD  linaclotide [facility]) 145 MCG CAPS capsule Take 1 capsule (145 mcg total) by mouth daily at 4 PM. 10/03/20   Rizwan,  Eunice Blase, MD  magic mouthwash SOLN Take 5 mLs by mouth 4 (four) times daily as needed for mouth pain. Pt is allergic to Magnesium 11/04/20   Walisiewicz, Kaitlyn E, PA-C  magnesium oxide (MAG-OX) 400 (240 Mg) MG tablet Take 1 tablet (400 mg total) by mouth daily. 11/08/20   Heilingoetter, Cassandra L, PA-C  metoprolol tartrate (LOPRESSOR) 100 MG tablet Take 1 tablet (100 mg total) by mouth 2 (two) times daily. 12/23/20   Baldwin Jamaica, PA-C  nystatin (MYCOSTATIN) 100000 UNIT/ML suspension Take 5 mLs (500,000 Units total) by mouth 4 (four) times daily. 12/16/20   Tyler Pita, MD  omeprazole (PRILOSEC) 20 MG capsule Take 20 mg by mouth daily before breakfast.  08/27/17   [provider]  ondansetron (ZOFRAN-ODT) 8 MG disintegrating tablet Take 8 mg by mouth 3 (three) times daily. 10/13/20   [provider]  polyethylene glycol (MIRALAX / GLYCOLAX) packet Take 34 g by mouth in the morning.     [provider]  potassium chloride SA (KLOR-CON) 20 MEQ tablet Take 1 tablet (20 mEq total) by mouth daily. Patient not taking: Reported on 12/20/2020 11/08/20   Heilingoetter,  Cassandra L, PA-C  prochlorperazine (COMPAZINE) 10 MG tablet Take 1 tablet (10 mg total) by mouth every 6 (six) hours as needed for nausea or vomiting. 10/27/20   Curt Bears, MD  rosuvastatin (CRESTOR) 5 MG tablet Take 5 mg by mouth 3 (three) times a week. Mondays Wednesdays Fridays    [provider]  sodium chloride 1 g tablet Take 1 g by mouth 3 (three) times daily. 11/26/20   [provider]  sucralfate (CARAFATE) 1 g tablet Take 1 tablet (1 g total) by mouth 4 (four) times daily. Dissolve each tablet in 15 cc water before use. Patient not taking: Reported on 12/20/2020 12/09/20   Kyung Rudd, MD  Tiotropium Bromide-Olodaterol 2.5-2.5 MCG/ACT AERS Inhale 2 puffs into the lungs daily. 11/16/20   Icard, Octavio Graves, DO  traMADol (ULTRAM) 50 MG tablet Take 100 mg by mouth as needed. 10/08/19   [provider]  VENTOLIN HFA 108 (90 Base) MCG/ACT inhaler Inhale 2 puffs into the lungs every 6 (six) hours as needed for wheezing or shortness of breath.  09/30/17   [provider]  zaleplon (SONATA) 10 MG capsule Take 10 mg by mouth at bedtime as needed (for interrupted sleep).     [provider]    Allergies    Augmentin [amoxicillin-pot clavulanate], Levaquin [levofloxacin in d5w], Mom [magnesium hydroxide], and Oxycodone  Review of Systems   Review of Systems  All other systems reviewed and are negative.  Physical Exam Updated Vital Signs BP (!) 152/86    Pulse 71    Temp 97.8 F (36.6 C) (Oral)    Resp (!) 24    Ht 1.676 m (5\' 6" )    Wt 85.7 kg    LMP  (LMP Unknown)    SpO2 96%    BMI 30.51 kg/m   Physical Exam Vitals and nursing note reviewed.  Constitutional:      Appearance: Normal appearance. She is obese.  HENT:     Head: Normocephalic.     Right Ear: External ear normal.     Left Ear: External ear normal.     Mouth/Throat:     Mouth: Mucous membranes are moist.  Eyes:     Pupils: Pupils are equal, round, and reactive to light.      Comments: Conjunctive are pale  Cardiovascular:  Rate and Rhythm: Normal rate and regular rhythm.  Pulmonary:     Effort: Pulmonary effort is normal.     Comments: Bibasilar crackles Abdominal:     General: Bowel sounds are normal.     Palpations: Abdomen is soft.  Musculoskeletal:        General: Normal range of motion.  Skin:    General: Skin is warm.     Capillary Refill: Capillary refill takes less than 2 seconds.  Neurological:     General: No focal deficit present.     Mental Status: She is alert.  Psychiatric:        Mood and Affect: Mood normal.    ED Results / Procedures / Treatments   Labs (all labs ordered are listed, but only abnormal results are displayed) Labs Reviewed  CBC WITH DIFFERENTIAL/PLATELET - Abnormal; Notable for the following components:      Result Value   WBC 0.7 (*)    RBC 2.41 (*)    Hemoglobin 8.0 (*)    HCT 23.8 (*)    RDW 17.3 (*)    Platelets 27 (*)    Neutro Abs 0.4 (*)    Lymphs Abs 0.2 (*)    All other components within normal limits  COMPREHENSIVE METABOLIC PANEL - Abnormal; Notable for the following components:   Sodium 132 (*)    Chloride 97 (*)    Glucose, Bld 119 (*)    Calcium 8.0 (*)    Total Protein 5.6 (*)    Albumin 3.1 (*)    AST 50 (*)    ALT 54 (*)    All other components within normal limits  URINALYSIS, ROUTINE W REFLEX MICROSCOPIC - Abnormal; Notable for the following components:   APPearance CLOUDY (*)    Specific Gravity, Urine >1.030 (*)    Hgb urine dipstick SMALL (*)    Protein, ur 30 (*)    Leukocytes,Ua SMALL (*)    All other components within normal limits  PROTIME-INR - Abnormal; Notable for the following components:   Prothrombin Time 16.3 (*)    INR 1.3 (*)    All other components within normal limits  URINALYSIS, MICROSCOPIC (REFLEX) - Abnormal; Notable for the following components:   Bacteria, UA MANY (*)    Non Squamous Epithelial PRESENT (*)    All other components within normal limits   CBG MONITORING, ED - Abnormal; Notable for the following components:   Glucose-Capillary 111 (*)    All other components within normal limits  CULTURE, BLOOD (ROUTINE X 2)  CULTURE, BLOOD (ROUTINE X 2)  LACTIC ACID, PLASMA  LACTIC ACID, PLASMA  PATHOLOGIST SMEAR REVIEW  TROPONIN I (HIGH SENSITIVITY)  TROPONIN I (HIGH SENSITIVITY)    EKG EKG Interpretation  Date/Time:  Sunday December 25 2020 11:19:36 EST Ventricular Rate:  72 PR Interval:  248 QRS Duration: 92 QT Interval:  446 QTC Calculation: 488 R Axis:   65 Text Interpretation: Sinus rhythm with 1st degree A-V block Otherwise normal ECG Confirmed by Pattricia Boss [phone]) on 12/25/2020 11:53:30 AM  Radiology DG Chest 2 View  Result Date: 12/25/2020 CLINICAL DATA:  Fatigue and shortness of breath. Lung cancer patient receiving chemotherapy and radiation therapy. EXAM: CHEST - 2 VIEW COMPARISON:  12/18/2020.  CT, 12/08/2020. FINDINGS: Cardiac silhouette is normal in size. No mediastinal or hilar masses or evidence of adenopathy. Mild streaky opacity at the left lung base consistent with atelectasis or scarring. Lungs otherwise clear. Relative lucency in the upper lobes consistent with  emphysema. Minimal pleural effusions.  No pneumothorax. Chronic compression fracture of L2.  No acute skeletal abnormality. IMPRESSION: 1. No acute cardiopulmonary disease. Electronically Signed   By: Lajean Manes M.D.   On: 12/25/2020 12:18    Procedures Procedures   Medications Ordered in ED Medications  cefTRIAXone (ROCEPHIN) 2 g in sodium chloride 0.9 % 100 mL IVPB (has no administration in time range)  sodium chloride flush (NS) 0.9 % injection 3 mL (3 mLs Intravenous Given 12/25/20 1226)  lactated ringers bolus 500 mL (0 mLs Intravenous Stopped 12/25/20 1308)  lactated ringers bolus 1,000 mL (1,000 mLs Intravenous New Bag/Given 12/25/20 1447)    ED Course  I have reviewed the triage vital signs and the nursing notes.  Pertinent labs  & imaging results that were available during my care of the patient were reviewed by me and considered in my medical decision making (see chart for details).  Clinical Course as of 12/25/20 1638  Sun Dec 25, 2020  1636 In-N-Out cath reordered and asked to send for culture [DR]    Clinical Course User Index [DR] Pattricia Boss, MD   MDM Rules/Calculators/A&P                          All labs reviewed. Pancytopenia noted- discussed with Dr. Lorenso Courier- advises she has f/u scheduled Tuesday.  Patient afebrile and would not treat unless febrile or source found No bleeding noted.  Urine came back positive and Rocephin ordered.  However this was a specimen from pure wick.  I have asked that nurse to do an In-N-Out cath and sent for culture Discussed care with Karmen Bongo, she will see and admit for hospitalist service. Final Clinical Impression(s) / ED Diagnoses Final diagnoses:  Pancytopenia ([place])  Chemotherapy-induced neutropenia ([place])  Urinary tract infection with hematuria, site unspecified    Rx / DC Orders ED Discharge Orders     None        Pattricia Boss, MD 12/25/20 [phone]

## 2020-12-25 NOTE — ED Triage Notes (Addendum)
Pt to triage via [facility] from home.  Lung cancer pt receiving chemo and radiation at [facility].  Reports increased fatigue and SOB x 1 week.  100% on room air.  EMS placed pt on 2 liters and then 4 liters O2 for comfort.    Reports chronic chest pain and back pain that she reports hasn't changed.

## 2020-12-25 NOTE — ED Notes (Signed)
C/o shortness of breath. Audible wheezing noted. O2 sats 100%.

## 2020-12-25 NOTE — ED Notes (Signed)
Patient complains of shortness of breath. O2 sats 95% on room air. Patient requests to be put on oxygen for comfort.

## 2020-12-25 NOTE — ED Notes (Signed)
Patient transported to X-ray 

## 2020-12-25 NOTE — ED Notes (Signed)
Sitting up in bed eating dinner tray. Tolerating well. Complains of generalized pain. Awaiting medications to be verified by pharmacy to cross over. VSS. Spouse at bedside. States shortness of breath has decreased. No audible wheezing at this time.

## 2020-12-26 ENCOUNTER — Ambulatory Visit: Payer: Medicare HMO

## 2020-12-26 ENCOUNTER — Other Ambulatory Visit: Payer: Self-pay

## 2020-12-26 ENCOUNTER — Telehealth: Payer: Self-pay | Admitting: Physician Assistant

## 2020-12-26 DIAGNOSIS — D61810 Antineoplastic chemotherapy induced pancytopenia: Principal | ICD-10-CM

## 2020-12-26 DIAGNOSIS — C3432 Malignant neoplasm of lower lobe, left bronchus or lung: Secondary | ICD-10-CM

## 2020-12-26 DIAGNOSIS — I480 Paroxysmal atrial fibrillation: Secondary | ICD-10-CM

## 2020-12-26 DIAGNOSIS — T451X5A Adverse effect of antineoplastic and immunosuppressive drugs, initial encounter: Secondary | ICD-10-CM

## 2020-12-26 LAB — BASIC METABOLIC PANEL
Anion gap: 11 (ref 5–15)
BUN: 19 mg/dL (ref 8–23)
CO2: 22 mmol/L (ref 22–32)
Calcium: 8.2 mg/dL — ABNORMAL LOW (ref 8.9–10.3)
Chloride: 100 mmol/L (ref 98–111)
Creatinine, Ser: 0.96 mg/dL (ref 0.44–1.00)
GFR, Estimated: >60 mL/min (ref >60)
Glucose, Bld: 101 mg/dL — ABNORMAL HIGH (ref 70–99)
Potassium: 4.1 mmol/L (ref 3.5–5.1)
Sodium: 133 mmol/L — ABNORMAL LOW (ref 135–145)

## 2020-12-26 LAB — CBC WITH DIFFERENTIAL/PLATELET
Abs Immature Granulocytes: 0.01 10*3/uL (ref 0.00–0.07)
Basophils Absolute: 0 10*3/uL (ref 0.0–0.1)
Basophils Relative: 2 %
Eosinophils Absolute: 0 10*3/uL (ref 0.0–0.5)
Eosinophils Relative: 0 %
HCT: 21.4 % — ABNORMAL LOW (ref 36.0–46.0)
Hemoglobin: 7.4 g/dL — ABNORMAL LOW (ref 12.0–15.0)
Immature Granulocytes: 2 %
Lymphocytes Relative: 13 %
Lymphs Abs: 0.1 10*3/uL — ABNORMAL LOW (ref 0.7–4.0)
MCH: 34.3 pg — ABNORMAL HIGH (ref 26.0–34.0)
MCHC: 34.6 g/dL (ref 30.0–36.0)
MCV: 99.1 fL (ref 80.0–100.0)
Monocytes Absolute: 0.2 10*3/uL (ref 0.1–1.0)
Monocytes Relative: 27 %
Neutro Abs: 0.3 10*3/uL — CL (ref 1.7–7.7)
Neutrophils Relative %: 56 %
Platelets: 16 10*3/uL — CL (ref 150–400)
RBC: 2.16 MIL/uL — ABNORMAL LOW (ref 3.87–5.11)
RDW: 17.2 % — ABNORMAL HIGH (ref 11.5–15.5)
WBC: 0.6 10*3/uL — CL (ref 4.0–10.5)
nRBC: 0 % (ref 0.0–0.2)

## 2020-12-26 LAB — BPAM RBC
Blood Product Expiration Date: 202301112359
Blood Product Expiration Date: 202301112359
ISSUE DATE / TIME: 202212151032
ISSUE DATE / TIME: 202212161052
Unit Type and Rh: 5100
Unit Type and Rh: 5100

## 2020-12-26 LAB — TYPE AND SCREEN
ABO/RH(D): O POS
Antibody Screen: NEGATIVE
Unit division: 0
Unit division: 0

## 2020-12-26 LAB — PREPARE RBC (CROSSMATCH)

## 2020-12-26 MED ORDER — TBO-FILGRASTIM 480 MCG/0.8ML ~~LOC~~ SOSY
480.0000 ug | PREFILLED_SYRINGE | Freq: Every day | SUBCUTANEOUS | Status: DC
  Filled 2020-12-26: qty 0.8

## 2020-12-26 MED ORDER — DILTIAZEM LOAD VIA INFUSION
10.0000 mg | Freq: Once | INTRAVENOUS | Status: AC
  Administered 2020-12-26: 11:00:00 10 mg via INTRAVENOUS
  Filled 2020-12-26: qty 10

## 2020-12-26 MED ORDER — CLONAZEPAM 1 MG PO TABS
1.0000 mg | ORAL_TABLET | Freq: Two times a day (BID) | ORAL | Status: DC | PRN
  Administered 2020-12-26 – 2021-01-03 (×7): 1 mg via ORAL
  Filled 2020-12-26: qty 2
  Filled 2020-12-26 (×6): qty 1

## 2020-12-26 MED ORDER — LEVALBUTEROL HCL 0.63 MG/3ML IN NEBU
0.6300 mg | INHALATION_SOLUTION | Freq: Four times a day (QID) | RESPIRATORY_TRACT | Status: DC | PRN
  Administered 2020-12-27 (×2): 0.63 mg via RESPIRATORY_TRACT
  Filled 2020-12-26 (×3): qty 3

## 2020-12-26 MED ORDER — SODIUM CHLORIDE 0.9% IV SOLUTION: Freq: Once | INTRAVENOUS | Status: AC

## 2020-12-26 MED ORDER — DILTIAZEM HCL-DEXTROSE 125-5 MG/125ML-% IV SOLN (PREMIX)
15.0000 mg/h | INTRAVENOUS | Status: DC
  Administered 2020-12-26: 11:00:00 5 mg/h via INTRAVENOUS
  Administered 2020-12-27: 06:00:00 10 mg/h via INTRAVENOUS
  Administered 2020-12-27 (×2): 15 mg/h via INTRAVENOUS
  Filled 2020-12-26 (×7): qty 125

## 2020-12-26 MED ORDER — SUCRALFATE 1 GM/10ML PO SUSP
1.0000 g | Freq: Three times a day (TID) | ORAL | Status: DC
  Administered 2020-12-26 – 2021-01-03 (×33): 1 g via ORAL
  Filled 2020-12-26 (×34): qty 10

## 2020-12-26 MED ORDER — TBO-FILGRASTIM 480 MCG/0.8ML ~~LOC~~ SOSY
480.0000 ug | PREFILLED_SYRINGE | Freq: Every day | SUBCUTANEOUS | Status: DC
  Administered 2020-12-26 – 2020-12-27 (×2): 480 ug via SUBCUTANEOUS
  Filled 2020-12-26 (×2): qty 0.8

## 2020-12-26 NOTE — ED Notes (Signed)
Patients heart rate up in to the 140s. Patient currently resting in bed in no distress. EKG done and MD paged.

## 2020-12-26 NOTE — Progress Notes (Addendum)
HEMATOLOGY-ONCOLOGY PROGRESS NOTE  SUBJECTIVE: Patricia [name] is followed by our office for limited stage small cell lung cancer.  She has been receiving concurrent chemoradiation with cisplatin 80 mg per metered squared on days 1 and etoposide 100 mg per metered squared on days 1, 2, and 3 every 3 weeks.  Status post 3 cycles.  Last cycle was started on 12/12/2020.  Presented to the emergency department with fatigue and shortness of breath.  Etiology of her shortness of breath is unclear as chest x-ray was normal.  She was also found to have A. fib with RVR with rate uncontrolled from 120-170.  She was found to have pancytopenia on her lab work.  The patient was seen in the emergency department.  She reports ongoing shortness of breath.  She is on O2 at 3 L/min.  She is not having any fevers or chills.  She reports some discomfort with swallowing secondary to radiation.  She does not report any abdominal pain, nausea, vomiting.  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 ([place])   10/31/2020 -  Chemotherapy   Patient is on Treatment Plan : LUNG SMALL CELL Cisplatin D1 + Etoposide D1-3 q21d        REVIEW OF SYSTEMS:   Constitutional: Denies fevers, chills  Eyes: Denies blurriness of vision Ears, nose, mouth, throat, and face: Denies mucositis or sore throat Respiratory: Reports shortness of breath Cardiovascular: Denies palpitation, chest discomfort Gastrointestinal:  Denies nausea, heartburn or change in bowel habits Skin: Denies abnormal skin rashes Lymphatics: Denies new lymphadenopathy or easy bruising Neurological:Denies numbness, tingling or new weaknesses Behavioral/Psych: Mood is stable, no new changes  Extremities: No lower extremity edema All other systems were reviewed with the patient and are negative.  I have reviewed the past medical history, past  surgical history, social history and family history with the patient and they are unchanged from previous note.   PHYSICAL EXAMINATION: ECOG PERFORMANCE STATUS: 2 - Symptomatic, <50% confined to bed  Vitals:   12/26/20 1144 12/26/20 1217  BP: 90/66 96/74  Pulse: 91 96  Resp: 20 (!) 24  Temp:    SpO2: 98% 96%   Filed Weights   12/25/20 1226  Weight: 85.7 kg    Intake/Output from previous day: 12/18 0701 - 12/19 0700 In: 1082.5 [IV Piggyback:1082.5] Out: -   GENERAL:alert, no distress and comfortable SKIN: skin color, texture, turgor are normal, no rashes or significant lesions EYES: normal, Conjunctiva are pink and non-injected, sclera clear LUNGS: clear to auscultation and percussion with normal breathing effort HEART: Tachycardic, irregular ABDOMEN:abdomen soft, non-tender and normal bowel sounds NEURO: alert & oriented x 3 with fluent speech, no focal motor/sensory deficits  LABORATORY DATA:  I have reviewed the data as listed CMP Latest Ref Rng & Units 12/26/2020 12/25/2020 12/20/2020  Glucose 70 - 99 mg/dL 101(H) 119(H) 100(H)  BUN 8 - 23 mg/dL 19 23 19   Creatinine 0.44 - 1.00 mg/dL 0.96 0.95 0.77  Sodium 135 - 145 mmol/L 133(L) 132(L) 139  Potassium 3.5 - 5.1 mmol/L 4.1 4.3 4.2  Chloride 98 - 111 mmol/L 100 97(L) 102  CO2 22 - 32 mmol/L 22 26 28   Calcium 8.9 - 10.3 mg/dL 8.2(L) 8.0(L) 8.5(L)  Total Protein 6.5 - 8.1 g/dL - 5.6(L) 6.1(L)  Total Bilirubin 0.3 - 1.2 mg/dL - 0.5 0.5  Alkaline Phos 38 - 126 U/L -  105 81  AST 15 - 41 U/L - 50(H) 13(L)  ALT 0 - 44 U/L - 54(H) 12    Lab Results  Component Value Date   WBC 0.6 (LL) 12/26/2020   HGB 7.4 (L) 12/26/2020   HCT 21.4 (L) 12/26/2020   MCV 99.1 12/26/2020   PLT 16 (LL) 12/26/2020   NEUTROABS 0.3 (LL) 12/26/2020    DG Chest 2 View  Result Date: 12/25/2020 CLINICAL DATA:  Fatigue and shortness of breath. Lung cancer patient receiving chemotherapy and radiation therapy. EXAM: CHEST - 2 VIEW COMPARISON:   12/18/2020.  CT, 12/08/2020. FINDINGS: Cardiac silhouette is normal in size. No mediastinal or hilar masses or evidence of adenopathy. Mild streaky opacity at the left lung base consistent with atelectasis or scarring. Lungs otherwise clear. Relative lucency in the upper lobes consistent with emphysema. Minimal pleural effusions.  No pneumothorax. Chronic compression fracture of L2.  No acute skeletal abnormality. IMPRESSION: 1. No acute cardiopulmonary disease. Electronically Signed   By: Lajean Manes M.D.   On: 12/25/2020 12:18   DG Chest 2 View  Result Date: 12/18/2020 CLINICAL DATA:  Chest pain EXAM: CHEST - 2 VIEW COMPARISON:  Chest x-ray dated November 11, 2020 FINDINGS: Cardiac and mediastinal contours are within normal limits. Mild left basilar opacities. No focal consolidation. No pleural effusion or pneumothorax. IMPRESSION: Mild left basilar opacities, likely due to scarring or atelectasis. Electronically Signed   By: Yetta Glassman M.D.   On: 12/18/2020 11:37   CT Chest W Contrast  Result Date: 12/09/2020 CLINICAL DATA:  Primary Cancer Type: Lung Imaging Indication: Assess response to therapy Interval therapy since last imaging? Yes Initial Cancer Diagnosis Date: 10/18/2020; Established by: Biopsy-proven Detailed Pathology: Limited stage small cell lung cancer. Primary Tumor location: Left lower lobe. Surgeries: No thoracic. Chemotherapy: Yes; Ongoing? Yes; Most recent administration: 11/21/2020 Immunotherapy? No Radiation therapy? Yes; Date Range: 11/21/2020 - 12/08/2020; Target: Left lung EXAM: CT CHEST WITH CONTRAST TECHNIQUE: Multidetector CT imaging of the chest was performed during intravenous contrast administration. CONTRAST:  72mL OMNIPAQUE IOHEXOL 350 MG/ML SOLN COMPARISON:  Most recent CT chest 10/15/2020.  11/04/2020 PET-CT. FINDINGS: Cardiovascular: Calcified atheromatous plaque of the thoracic aorta. No aneurysmal dilation. Normal heart size. Three-vessel coronary calcification.  Normal appearance of central pulmonary vasculature on venous phase. Mediastinum/Nodes: No thoracic inlet, axillary, mediastinal or hilar adenopathy. Esophagus grossly normal. Resolution of subcarinal nodal enlargement. Now measuring 10 mm short axis as compared to 20 mm short axis. LEFT infrahilar nodal tissue at 5 mm short axis, previously 9 mm short axis. Adenopathy adjacent to LEFT inferior pulmonary vein and adjacent nodule is no longer evident. Contralateral hilar nodal tissue remains un enlarged at approximately 10 mm short axis. Lungs/Pleura: Pulmonary emphysema as before. Basilar scarring and atelectasis. No signs of suspicious nodule. Trace pleural effusion and pleural thickening in the LEFT chest without nodular characteristics airways are patent. Upper Abdomen: Imaged portions the upper abdomen without acute process or significant finding. Musculoskeletal: No acute bone finding. No destructive bone process. Spinal degenerative changes. Signs of cement augmentation of the L2 vertebral body with similar appearance to prior imaging. IMPRESSION: Marked interval response to therapy. Near complete resolution of nodal tissue in the subcarinal region and LEFT hilum when compared to the prior study, no areas with persistent pathologic enlargement. No change in non FDG avid lymph nodes elsewhere in the chest. Pulmonary emphysema. Aortic atherosclerosis. Aortic Atherosclerosis (ICD10-I70.0) and Emphysema (ICD10-J43.9). Electronically Signed   By: Zetta Bills M.D.   On: 12/09/2020  11:27    ASSESSMENT AND PLAN: This is a 70 year old female with limited stage (T2a, N2, M0) small cell lung cancer who presented with a left lower lobe lung mass in addition to left hilar and subcarinal lymphadenopathy October 2022.  She has been receiving a course of systemic chemotherapy with cisplatin etoposide given concurrently with radiation.  She is status post 3 cycles of chemotherapy.  Now admitted with acute respiratory  failure with hypoxia and A. fib with RVR.  She was found to have pancytopenia on her lab work likely due to recent chemotherapy.  She is not febrile.  The patient has been started on Granix 300 mcg daily.  However, based on weight, will increase dose to 480 mcg subcu daily.  She will continue Granix until [place] is 1.5 or higher then it may be discontinued.  Her hemoglobin is 7.4 today.  Recommend PRBC transfusion for hemoglobin less than 8.  I have ordered 1 unit PRBCs today.  Platelet count is 16,000 today.  She had a small nosebleed yesterday.  Recommend platelet transfusion for platelet count less than 20,000 or active bleeding.  I have ordered 1 unit of platelets today.  I have discussed the risk/benefits of PRBC and platelet transfusion with the patient and she is agreeable to proceed.  For her radiation-induced esophagitis, I have ordered Carafate.  She will follow-up with radiation oncology as previously scheduled.  She has outpatient follow-up for labs and a visit scheduled with [place] on 12/22.  We will reschedule this if she remains in the hospital.  Next chemotherapy is currently scheduled for 12/27.   Future Appointments  Date Time Provider [place]  12/27/2020  1:45 PM [facility] None  12/27/2020  2:30 PM [facility] LINAC 1 [facility] None  12/28/2020  2:30 PM [facility] LINAC 1 [facility] None  12/29/2020 10:30 AM [facility] LAB [facility] None  12/29/2020 11:00 AM Heilingoetter, Cassandra L, PA-C [facility] None  12/29/2020  2:30 PM [facility] LINAC 1 [facility] None  12/30/2020  2:30 PM [facility] LINAC 1 [facility] None  01/03/2021  9:00 AM [facility] INFUSION [facility] None  01/03/2021  2:30 PM [facility] LINAC 1 [facility] None  01/04/2021 12:30 PM [facility] INFUSION [facility] None  01/04/2021  2:45 PM [facility] LINAC 1 [facility] None  01/05/2021 12:00 PM [facility] INFUSION [facility] None  01/05/2021  2:30 PM [facility]  LINAC 1 [facility] None  01/06/2021 10:55 AM [place], PA-C [facility]  01/06/2021  2:45 PM [facility] LINAC 1 [facility] None  01/10/2021  2:30 PM [facility] LINAC 1 [facility] None  01/11/2021  2:45 PM [facility] LINAC 1 [facility] None  01/12/2021  2:30 PM [facility] LINAC 1 [facility] None  01/13/2021  2:15 PM [facility] LINAC 1 [facility] None  01/23/2021  8:00 AM [facility] LAB [facility] None  01/23/2021  8:30 AM Heilingoetter, Cassandra L, PA-C [facility] None  01/23/2021  9:15 AM [facility] INFUSION [facility] None  01/24/2021  2:00 PM [facility] INFUSION [facility] None  01/25/2021  2:00 PM [facility] INFUSION [facility] None      LOS: 1 day   Mikey Bussing, DNP, AGPCNP-BC, AOCNP 12/26/20

## 2020-12-26 NOTE — ED Notes (Signed)
Patient ambulated to restroom with one person assist and on 3L [place] to have BM.

## 2020-12-26 NOTE — Progress Notes (Signed)
PROGRESS NOTE    Patricia [name]  RJJ:[id number] DOB: Feb 20, 1950 DOA: 12/25/2020 PCP: [name], MD  Chief Complaint  Patient presents with   Weakness    Brief Narrative:   Patricia [name] is a 70 y.o. female with medical history significant of HTN; HLD; aflutter scheduled for cardioversion next month; lung cancer (10/2020); and low back pain presenting with fatigue, SOB. she was found to be pancytopenic, on labs on admission. CXR does not show any pneumonia. UA is negative for infection.  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  Requiring about 3 lit of [place] oxygen, tachypnea.  CXR is unremarkable. If her respiratory symptoms do not improve, will move forward with a CT chest for further evaluation.  Meanwhile continue with bronchodilators brovana and Incruse Ellipta and levalbuterol prn.  No wheezing heard on exam.    Atrial fibrillation with RVR Rate uncontrolled 120 to 170/min. On flecainide 100 mg BID and metoprolol 100 mg BID.  Last echocardiogram from 9/22 showed Left ventricular ejection fraction, by estimation, is 60 to 65%. The left ventricle has normal function. The left ventricle has no regional  wall motion abnormalities. Left ventricular diastolic parameters are  consistent with Grade I diastolic  dysfunction (impaired relaxation). Will get magnesium levels.  Looks like she missed her evening doses of meds.  Restarted her meds early today and cardiology consulted to see if she needs cardioversion sooner.  Eliquis on hold due to severe  thrombocytopenia from the chemotherapy.     Hypertension:  - BP parameters are optimal.   Primary small cell ca of lower lobe of left lung:  S/p chemoradiation with cisplatin and etoposide. S/p 3 cycles.  S/p chemo last Wednesday.  Now presents with Pancytopenia;  On Granix injections.    Hyperlipidemia:  Resume statin.    H/o esophagitis:  Currently on diflucan, Protonix.  Continue the same, .     Adjustment disorder:  Lexapro started this admission, meanwhile she is on  klonopin, continue the same. .    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    Chronic pain syndrome; Resume home meds.   Diarrhea  Resolved.    Mild hyponatremia:  Monitor.    Elevated liver enzymes:  She denies any  nausea, vomiting or abd pain.  Monitor.     Anemia of chronic disease:  Normocytic anemia.  Baseline hemoglobin appears to be around 8.  Currently at 8.  Transfuse to keep hemoglobin greater than 7.     DVT prophylaxis: eliquis ( on hold for low platelet count) Code Status: (DNR Family Communication: none at bedside.  Disposition:   Status is: Inpatient  Remains inpatient appropriate because: acute respiratory failure with hypoxia.        Consultants:  Cardiology Oncology  Procedures: none.  Antimicrobials: Antibiotics Given (last 72 hours)     Date/Time Action Medication Dose Rate   12/25/20 1751 New Bag/Given   cefTRIAXone (ROCEPHIN) 2 g in sodium chloride 0.9 % 100 mL IVPB 2 g 200 mL/hr   12/25/20 1955 Given   valACYclovir (VALTREX)  tablet 1,000 mg 1,000 mg    12/26/20 4709 Given   valACYclovir (VALTREX) tablet 1,000 mg 1,000 mg          Subjective: Sob is the same. Some diarrhea yesterday.   Objective: Vitals:   12/26/20 0650 12/26/20 0727 12/26/20 0805 12/26/20 0900  BP: (!) 141/81  137/73 112/74  Pulse: 75  81 (!) 148  Resp: (!) 24  18 (!) 24  Temp:  98.3 F (36.8 C)    TempSrc:  Oral    SpO2: 99%  99% 96%  Weight:      Height:         Intake/Output Summary (Last 24 hours) at 12/26/2020 0956 Last data filed at 12/25/2020 1542 Gross per 24 hour  Intake 1082.53 ml  Output --  Net 1082.53 ml   Filed Weights   12/25/20 1226  Weight: 85.7 kg    Examination:  General exam: elderly woman in mild distress from sob.  Respiratory system: air entry diminished, on 3 lit of [place] oxygen. Tachypnea.  Cardiovascular system: S1 & S2 heard, RRR. No JVD,  No pedal edema. Gastrointestinal system: Abdomen is nondistended, soft and nontender. Normal bowel sounds heard. Central nervous system: Alert and oriented. No focal neurological deficits. Extremities: Symmetric 5 x 5 power. Skin: No rashes, lesions or ulcers Psychiatry:  anxious.     Data Reviewed: I have personally reviewed following labs and imaging studies  CBC: Recent Labs  Lab 12/20/20 1341 12/25/20 1221  WBC 1.9* 0.7*  NEUTROABS 1.6* 0.4*  HGB 7.7* 8.0*  HCT 21.9* 23.8*  MCV 102.8* 98.8  PLT 167 27*    Basic Metabolic Panel: Recent Labs  Lab 12/20/20 1341 12/25/20 1221 12/26/20 0344  NA 139 132* 133*  K 4.2 4.3 4.1  CL 102 97* 100  CO2 28 26 22   GLUCOSE 100* 119* 101*  BUN 19 23 19   CREATININE 0.77 0.95 0.96  CALCIUM 8.5* 8.0* 8.2*  MG 1.2*  --   --     GFR: Estimated Creatinine Clearance: 59.3 mL/min (by C-G formula based on SCr of 0.96 mg/dL).  Liver Function Tests: Recent Labs  Lab 12/20/20 1341 12/25/20 1221  AST 13* 50*  ALT 12 54*  ALKPHOS 81 105  BILITOT 0.5 0.5  PROT 6.1* 5.6*  ALBUMIN 3.4* 3.1*    CBG: Recent Labs  Lab 12/25/20 1248  GLUCAP 111*     Recent Results (from the past 240 hour(s))  Blood culture (routine x 2)     Status: None (Preliminary result)   Collection Time: 12/25/20 11:57 AM   Specimen: BLOOD  Result Value Ref Range Status   Specimen Description BLOOD LEFT ANTECUBITAL  Final   Special Requests   Final    BOTTLES DRAWN AEROBIC AND ANAEROBIC Blood Culture results may not be optimal due to an  inadequate volume of blood received in culture bottles   Culture   Final    NO GROWTH < 24 HOURS Performed at [facility] [address] [place], [place] [address]    Report Status PENDING  Incomplete  Resp Panel by RT-PCR (Flu A&B, Covid) Nasopharyngeal Swab     Status: None   Collection Time: 12/25/20  8:34 PM   Specimen: Nasopharyngeal Swab; Nasopharyngeal(NP) swabs in vial transport medium  Result Value Ref Range Status   SARS Coronavirus 2 by RT PCR NEGATIVE NEGATIVE Final    Comment: (NOTE) SARS-CoV-2 target nucleic acids are NOT DETECTED.  The SARS-CoV-2 RNA is generally  detectable in upper respiratory specimens during the acute phase of infection. The lowest concentration of SARS-CoV-2 viral copies this assay can detect is 138 copies/mL. A negative result does not preclude SARS-Cov-2 infection and should not be used as the sole basis for treatment or other patient management decisions. A negative result may occur with  improper specimen collection/handling, submission of specimen other than nasopharyngeal swab, presence of viral mutation(s) within the areas targeted by this assay, and inadequate number of viral copies(<138 copies/mL). A negative result must be combined with clinical observations, patient history, and epidemiological information. The expected result is Negative.  Fact Sheet for Patients:  EntrepreneurPulse.com.au  Fact Sheet for Healthcare Providers:  IncredibleEmployment.be  This test is no t yet approved or cleared by the [place] FDA and  has been authorized for detection and/or diagnosis of SARS-CoV-2 by FDA under an Emergency Use Authorization (EUA). This EUA will remain  in effect (meaning this test can be used) for the duration of the COVID-19 declaration under Section 564(b)(1) of the Act, 21 U.S.C.section 360bbb-3(b)(1), unless the authorization is terminated  or revoked sooner.        Influenza A by PCR NEGATIVE NEGATIVE Final   Influenza B by PCR NEGATIVE NEGATIVE Final    Comment: (NOTE) The Xpert Xpress SARS-CoV-2/FLU/RSV plus assay is intended as an aid in the diagnosis of influenza from Nasopharyngeal swab specimens and should not be used as a sole basis for treatment. Nasal washings and aspirates are unacceptable for Xpert Xpress SARS-CoV-2/FLU/RSV testing.  Fact Sheet for Patients: EntrepreneurPulse.com.au  Fact Sheet for Healthcare Providers: IncredibleEmployment.be  This test is not yet approved or cleared by the [place] FDA and has been authorized for detection and/or diagnosis of SARS-CoV-2 by FDA under an Emergency Use Authorization (EUA). This EUA will remain in effect (meaning this test can be used) for the duration of the COVID-19 declaration under Section 564(b)(1) of the Act, 21 U.S.C. section 360bbb-3(b)(1), unless the authorization is terminated or revoked.  Performed at [facility], [place] [address], [place], [place] [address]          Radiology Studies: DG Chest 2 View  Result Date: 12/25/2020 CLINICAL DATA:  Fatigue and shortness of breath. Lung cancer patient receiving chemotherapy and radiation therapy. EXAM: CHEST - 2 VIEW COMPARISON:  12/18/2020.  CT, 12/08/2020. FINDINGS: Cardiac silhouette is normal in size. No mediastinal or hilar masses or evidence of adenopathy. Mild streaky opacity at the left lung base consistent with atelectasis or scarring. Lungs otherwise clear. Relative lucency in the upper lobes consistent with emphysema. Minimal pleural effusions.  No pneumothorax. Chronic compression fracture of L2.  No acute skeletal abnormality. IMPRESSION: 1. No acute cardiopulmonary disease. Electronically Signed   By: Lajean Manes M.D.   On: 12/25/2020 12:18        Scheduled Meds:  apixaban  5 mg Oral BID   arformoterol  15 mcg Nebulization BID   And   umeclidinium bromide  1  puff Inhalation Daily   carisoprodol  350 mg Oral TID   clonazePAM  1 mg Oral QHS   docusate sodium  100 mg Oral BID   escitalopram  10 mg Oral Daily   flecainide  100 mg Oral BID   fluconazole  200 mg Oral Daily   gabapentin  600 mg Oral BID   linaclotide  145 mcg Oral q1600   loratadine  10 mg Oral Daily   metoprolol tartrate  100 mg Oral BID   mometasone-formoterol  2 puff Inhalation BID   pantoprazole  40 mg Oral Daily   polyethylene glycol  34 g Oral q AM   rosuvastatin  5 mg Oral Once per day on Mon Wed Fri   sodium chloride  1 g Oral TID   Tbo-Filgrastim  300 mcg Subcutaneous q1800   traMADol  100 mg Oral Q8H   valACYclovir  1,000 mg Oral BID   Continuous Infusions:  sodium chloride 75 mL/hr at 12/25/20 1959     LOS: 1 day        Hosie Poisson, MD [facility]   To contact the attending provider between 7A-7P or the covering provider during after hours 7P-7A, please log into the web site [email or web address] and access using universal [place] password for that web site. If you do not have the password, please call the hospital operator.  12/26/2020, 9:56 AM

## 2020-12-26 NOTE — ED Notes (Signed)
Breakfast orders placed 

## 2020-12-26 NOTE — Telephone Encounter (Signed)
Scheduled per sch msg. Called and left msg  

## 2020-12-26 NOTE — ED Notes (Signed)
Cardiology at bedside.

## 2020-12-26 NOTE — Plan of Care (Signed)
Pt admitted to unit room 6N18. Pt alert and oriented vitals stable. Caridizem drip at 5 mg/hr and NS at 75 hr. Denies pain at this time.

## 2020-12-26 NOTE — Consult Note (Addendum)
Cardiology Consultation:   Patient ID: Patricia [name] MRN: [id number]; DOB: 05/17/1950  Admit date: 12/25/2020 Date of Consult: 12/26/2020  PCP:  [name], MD   [facility] Providers Cardiologist:  None  Electrophysiologist:  Jacynda Brunke Meredith Leeds, MD  {   Patient Profile:   Patricia [name] is a 70 y.o. female with a hx of HTN, HLD, TIA, IBS, SVT, AFib, chronic back pain, lung Ca (non-operable) who is being seen 12/26/2020 for the evaluation of rapid AFib at the request of Dr. Karleen Hampshire.   AAD hx  Started on Flecainide 12/20/20 > up-titrated 12/20/20   History of Present Illness:   [name] has been struggling of late with her AFib. Recent hx includes ER visit 11/11/20 with palpitations, tachycardia, EKG was ST 104bpm, was hypokalemic (3.4) (noted by her oncologist as well) and low mag (1.2), started on K+ supplementation, developed Afib w/RVR and given IV lopressor x2 and discharged from the ER back in [place]   Saw Dr. Valeta Harms 11/16/20, noted she was planned for XRT with oncology.  Suspected component of COPD and started on maintenance inhaler   Called 11/11 with reports of elevated HRs given an appt 11/29   I saw her 12/06/20 She has had several episodes of racing heart rates associated with marked weakness and near syncope.  She had one this AM, they last 20-30 min, she has to lay or sit down immediately She denies CP, has not fainted She in general has had a  mild increase in her SOB with the start of her chemo/XRT. When not in RVR/her tachycardia she denies any near syncope or syncope. No obvious bleeding, CBC/counts drifting downward, no one has asked her to stop her Eliquis She has been started on something she says to help with her neutrophils In d/w Dr. Curt Bears, started on flecainide 50mg  BID   12/18/20 ER visit with painful swallowing found with esophagitis She was in AFib w/RVR 110's-130's,, unaware from a symptom perspective, given a couple dosed of  metoprolol with lottle affect, pt declined admisison   I saw her again 12/20/20 She was still in fast AFib her flecainide up titrated with plans for follow up EKG +/-DCCV She was not feeling well at all with chemo, XRT, Hgb trending generally downward, though remained on her Eliquis without missing any doses, struggling with esophagitis causing significant pain  She had a f/u EKG done in the office 12/15, she was in an AFlutter 110's, her lopressor increased and planned for DCCV  She came to the ER yesterday with progressive weakness, ongoing esophageal pain, SOB, admitted with possible COPD exacerbation as well as potentially symptomatic anemia and SOB from her lung cancer, pancytopenia. She arrived in SR her flecainide, metoprolol and eliquis all continued. 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 She said her breathing is better after the treatments she got but after the 2nd Garlon Hatchet) her heart went back out of rhythm.  When ist was racing 160's she felt heaviness, is a little better now (120's-130's)   Past Medical History:  Diagnosis Date   Anxiety    GERD (gastroesophageal reflux disease)    Hyperlipidemia    Hypertension    IBS (irritable bowel syndrome)  Insomnia    Low back pain    Lung cancer ([place]) 10/2020   TIA (transient ischemic attack) 10/2017   no deficits   Vitamin B12 deficiency    Vitamin D deficiency     Past Surgical History:  Procedure Laterality Date   BACK SURGERY     BREAST EXCISIONAL BIOPSY Left 1997   benign   BRONCHIAL BIOPSY  10/18/2020   Procedure: BRONCHIAL BIOPSIES;  Surgeon: [name], DO;  Location: [place] [facility];  Service: Pulmonary;;   BRONCHIAL BRUSHINGS   10/18/2020   Procedure: BRONCHIAL BRUSHINGS;  Surgeon: [name], DO;  Location: [place] [facility];  Service: Pulmonary;;   BRONCHIAL NEEDLE ASPIRATION BIOPSY  10/18/2020   Procedure: BRONCHIAL NEEDLE ASPIRATION BIOPSIES;  Surgeon: [name], DO;  Location: [place];  Service: Pulmonary;;   BRONCHIAL WASHINGS  10/18/2020   Procedure: BRONCHIAL WASHINGS;  Surgeon: [name], DO;  Location: [place] [facility];  Service: Pulmonary;;   hemorrhoidecotmy     HERNIA MESH REMOVAL     OPEN REDUCTION INTERNAL FIXATION (ORIF) DISTAL RADIAL FRACTURE Right 11/19/2019   Procedure: OPEN REDUCTION INTERNAL FIXATION (ORIF) DISTAL RADIUS AND ULNA FRACTURE WITH REPAIR AS NECESSARY;  Surgeon: Roseanne Kaufman, MD;  Location: [place];  Service: Orthopedics;  Laterality: Right;  2 hrs Block with IV sedation   ORIF RADIUS & ULNA FRACTURES     TONSILLECTOMY     VIDEO BRONCHOSCOPY WITH ENDOBRONCHIAL NAVIGATION Left 10/18/2020   Procedure: VIDEO BRONCHOSCOPY WITH ENDOBRONCHIAL NAVIGATION;  Surgeon: [name], DO;  Location: [place];  Service: Pulmonary;  Laterality: Left;  ION   VIDEO BRONCHOSCOPY WITH ENDOBRONCHIAL ULTRASOUND Bilateral 10/18/2020   Procedure: VIDEO BRONCHOSCOPY WITH ENDOBRONCHIAL ULTRASOUND;  Surgeon: [name], DO;  Location: [place];  Service: Pulmonary;  Laterality: Bilateral;   VIDEO BRONCHOSCOPY WITH RADIAL ENDOBRONCHIAL ULTRASOUND  10/18/2020   Procedure: VIDEO BRONCHOSCOPY WITH RADIAL ENDOBRONCHIAL ULTRASOUND;  Surgeon: [name], DO;  Location: [place] [facility];  Service: Pulmonary;;     Home Medications:  Prior to Admission medications   Medication Sig Start Date End Date Taking? Authorizing Provider  carboxymethylcellulose (REFRESH PLUS) 0.5 % SOLN Place 1 drop into both eyes 3 (three) times daily as needed (for dryness).    Yes [provider]  carisoprodol (SOMA) 350 MG tablet Take 350 mg by mouth 3 (three) times daily.  09/26/17  Yes  [provider]  Cholecalciferol (VITAMIN D3) 50 MCG (2000 UT) TABS Take 2,000 Units by mouth daily.   Yes [provider]  clobetasol ointment (TEMOVATE) 8.09 % Apply 1 application topically See admin instructions. Apply to vaginal area daily as directed   Yes [provider]  clonazePAM (KLONOPIN) 1 MG tablet Take 1 mg by mouth at bedtime.  10/15/17  Yes [provider]  ELIQUIS 5 MG TABS tablet TAKE 1 TABLET(5 MG) BY MOUTH TWICE DAILY Patient taking differently: Take 5 mg by mouth 2 (two) times daily. 10/03/20  Yes Jackalyn Haith, Ocie Doyne, MD  flecainide (TAMBOCOR) 100 MG tablet Take 1 tablet (100 mg total) by mouth 2 (two) times daily. 12/20/20  Yes Baldwin Jamaica, PA-C  fluconazole (DIFLUCAN) 100 MG tablet Take 2 tablets (200 mg total) by mouth daily for 14 days. Take 400 mg on first day, then 200 mg per day Patient taking differently: Take 200 mg by mouth daily. 12/18/20 01/01/21 Yes Sherwood Gambler, MD  fluticasone (FLONASE) 50 MCG/ACT nasal spray Place 2 sprays into both nostrils daily as needed for  allergies or rhinitis.  10/11/17  Yes [provider]  furosemide (LASIX) 20 MG tablet Take 1 tablet (20 mg total) by mouth daily. 10/20/20 01/18/21 Yes Dahal, Marlowe Aschoff, MD  gabapentin (NEURONTIN) 600 MG tablet Take 600 mg by mouth See admin instructions. Take 600 mg by mouth in the morning and at lunchtime 09/16/17  Yes [provider]  HYDROcodone-acetaminophen (NORCO) 7.5-325 MG tablet Take 1 tablet by mouth every 6 (six) hours as needed for severe pain. 12/18/20  Yes Sherwood Gambler, MD  lidocaine (XYLOCAINE) 2 % solution Use as directed 15 mLs in the mouth or throat every 6 (six) hours as needed for mouth pain. 12/18/20  Yes Sherwood Gambler, MD  linaclotide [facility]) 145 MCG CAPS capsule Take 1 capsule (145 mcg total) by mouth daily at 4 PM. 10/03/20  Yes Debbe Odea, MD  metoprolol tartrate (LOPRESSOR) 100 MG tablet Take 1 tablet (100 mg total)  by mouth 2 (two) times daily. 12/23/20  Yes Baldwin Jamaica, PA-C  nystatin (MYCOSTATIN) 100000 UNIT/ML suspension Take 5 mLs (500,000 Units total) by mouth 4 (four) times daily. 12/16/20  Yes Tyler Pita, MD  omeprazole (PRILOSEC) 20 MG capsule Take 20 mg by mouth daily before breakfast.  08/27/17  Yes [provider]  ondansetron (ZOFRAN-ODT) 8 MG disintegrating tablet Take 8 mg by mouth 3 (three) times daily. 10/13/20  Yes [provider]  polyethylene glycol (MIRALAX / GLYCOLAX) packet Take 34 g by mouth in the morning.    Yes [provider]  prochlorperazine (COMPAZINE) 10 MG tablet Take 1 tablet (10 mg total) by mouth every 6 (six) hours as needed for nausea or vomiting. 10/27/20  Yes Curt Bears, MD  rosuvastatin (CRESTOR) 5 MG tablet Take 5 mg by mouth 3 (three) times a week. Mondays Wednesdays Fridays   Yes [provider]  sodium chloride 1 g tablet Take 1 g by mouth 3 (three) times daily. 11/26/20  Yes [provider]  Tiotropium Bromide-Olodaterol 2.5-2.5 MCG/ACT AERS Inhale 2 puffs into the lungs daily. 11/16/20  Yes Icard, Octavio Graves, DO  traMADol (ULTRAM) 50 MG tablet Take 100 mg by mouth in the morning, at noon, and at bedtime. 10/08/19  Yes [provider]  VENTOLIN HFA 108 (90 Base) MCG/ACT inhaler Inhale 2 puffs into the lungs every 6 (six) hours as needed for wheezing or shortness of breath.  09/30/17  Yes [provider]  zaleplon (SONATA) 10 MG capsule Take 10 mg by mouth at bedtime as needed (for interrupted sleep).    Yes [provider]  acetaminophen (TYLENOL) 325 MG tablet 1 tablet as needed. Patient not taking: Reported on 12/25/2020    [provider]  budesonide-formoterol (SYMBICORT) 80-4.5 MCG/ACT inhaler Take 2 puffs first thing in am and then another 2 puffs about 12 hours later. Patient not taking: Reported on 12/25/2020 08/11/20   Tanda Rockers, MD  magic mouthwash SOLN Take 5  mLs by mouth 4 (four) times daily as needed for mouth pain. Pt is allergic to Magnesium Patient not taking: Reported on 12/25/2020 11/04/20   Sherol Dade E, PA-C  magnesium oxide (MAG-OX) 400 (240 Mg) MG tablet Take 1 tablet (400 mg total) by mouth daily. Patient not taking: Reported on 12/25/2020 11/08/20   Heilingoetter, Cassandra L, PA-C    Inpatient Medications: Scheduled Meds:  apixaban  5 mg Oral BID   arformoterol  15 mcg Nebulization BID   And   umeclidinium bromide  1 puff Inhalation Daily  carisoprodol  350 mg Oral TID   clonazePAM  1 mg Oral QHS   docusate sodium  100 mg Oral BID   escitalopram  10 mg Oral Daily   flecainide  100 mg Oral BID   fluconazole  200 mg Oral Daily   gabapentin  600 mg Oral BID   linaclotide  145 mcg Oral q1600   loratadine  10 mg Oral Daily   metoprolol tartrate  100 mg Oral BID   mometasone-formoterol  2 puff Inhalation BID   pantoprazole  40 mg Oral Daily   polyethylene glycol  34 g Oral q AM   rosuvastatin  5 mg Oral Once per day on Mon Wed Fri   sodium chloride  1 g Oral TID   Tbo-Filgrastim  300 mcg Subcutaneous q1800   traMADol  100 mg Oral Q8H   valACYclovir  1,000 mg Oral BID   Continuous Infusions:  sodium chloride 75 mL/hr at 12/25/20 1959   PRN Meds: acetaminophen **OR** acetaminophen, bisacodyl, hydrALAZINE, HYDROcodone-acetaminophen, levalbuterol, lidocaine, morphine injection, ondansetron **OR** ondansetron (ZOFRAN) IV, polyvinyl alcohol, prochlorperazine, zolpidem  Allergies:    Allergies  Allergen Reactions   Augmentin [Amoxicillin-Pot Clavulanate] Nausea Only and Other (See Comments)    GI upset   Levaquin [Levofloxacin In D5w] Other (See Comments)    Joint problems    Mom [Magnesium Hydroxide] Other (See Comments)    Welts    Oxycodone Other (See Comments)    Nausea     Social History:   Social History   Socioeconomic History   Marital status: Married    Spouse name: Not on file   Number of  children: Not on file   Years of education: Not on file   Highest education level: Not on file  Occupational History   Not on file  Tobacco Use   Smoking status: Former    Packs/day: 1.00    Years: 50.00    Pack years: 50.00    Types: Cigarettes    Start date: 4    Quit date: 12/14/2019    Years since quitting: 1.0   Smokeless tobacco: Never  Vaping Use   Vaping Use: Never used  Substance and Sexual Activity   Alcohol use: Not Currently    Comment: occ once every 2 months   Drug use: Never   Sexual activity: Not on file  Other Topics Concern   Not on file  Social History Narrative   Live with husband.  Education HS.  Retired.  Children 2 (daughter). 1 cup coffee.     Social Determinants of Health   Financial Resource Strain: Low Risk    Difficulty of Paying Living Expenses: Not hard at all  Food Insecurity: No Food Insecurity   Worried About Charity fundraiser in the Last Year: Never true   [place] in the Last Year: Never true  Transportation Needs: No Transportation Needs   Lack of Transportation (Medical): No   Lack of Transportation (Non-Medical): No  Physical Activity: Not on file  Stress: No Stress Concern Present   Feeling of Stress : Only a little  Social Connections: Engineer, building services of Communication with Friends and Family: More than three times a week   Frequency of Social Gatherings with Friends and Family: More than three times a week   Attends Religious Services: More than 4 times per year   Active Member of [employer] or Organizations: Yes   Attends Archivist Meetings: More than  4 times per year   Marital Status: Married  Human resources officer Violence: Not on file    Family History:   Family History  Problem Relation Age of Onset   Breast cancer Sister    Breast cancer Maternal Aunt    Breast cancer Paternal Aunt      ROS:  Please see the history of present illness.  All other ROS reviewed and negative.      Physical Exam/Data:   Vitals:   12/26/20 0650 12/26/20 0727 12/26/20 0805 12/26/20 0900  BP: (!) 141/81  137/73 112/74  Pulse: 75  81 (!) 148  Resp: (!) 24  18 (!) 24  Temp:  98.3 F (36.8 C)    TempSrc:  Oral    SpO2: 99%  99% 96%  Weight:      Height:        Intake/Output Summary (Last 24 hours) at 12/26/2020 0946 Last data filed at 12/25/2020 1542 Gross per 24 hour  Intake 1082.53 ml  Output --  Net 1082.53 ml   Last 3 Weights 12/25/2020 12/23/2020 12/22/2020  Weight (lbs) 189 lb 189 lb 4.8 oz 184 lb 8 oz  Weight (kg) 85.73 kg 85.866 kg 83.689 kg     Body mass index is 30.51 kg/m.  General:  Well nourished, well developed, in no acute distress, appears chronically ill, + for pallor, allopecia HEENT: normal Neck: no JVD Vascular: No carotid bruits Cardiac:  irreg-irreg, tachycardic; no murmurs, gallops or rubs Lungs:  soft exp  wheezing at the bases mostly, no rhonchi or rales  Abd: soft, nontender Ext: no edema Musculoskeletal:  No deformities, BUE and BLE strength normal and equal Skin: warm and dry  Neuro:  CNs 2-12 intact, no focal abnormalities noted Psych:  Normal affect   EKG:  The EKG was personally reviewed and demonstrates:    SR 72bpm, 1st degree AVblock, 244ms, QRS 92 AFib 144bpm  Telemetry:  Telemetry was personally reviewed and demonstrates:   AFib 120's-130's  Relevant CV Studies:   10/01/20: TTE  1. Left ventricular ejection fraction, by estimation, is 60 to 65%. The  left ventricle has normal function. The left ventricle has no regional  wall motion abnormalities. Left ventricular diastolic parameters are  consistent with Grade I diastolic dysfunction (impaired relaxation).   2. Right ventricular systolic function is normal. The right ventricular  size is normal.   3. Prominent epicardial fat.   4. The mitral valve is normal in structure. No evidence of mitral valve  regurgitation. No evidence of mitral stenosis.   5. The aortic valve  was not well visualized. There is mild calcification  of the aortic valve. Aortic valve regurgitation is not visualized. Mild  aortic valve sclerosis is present, with no evidence of aortic valve  stenosis.   6. The inferior vena cava is normal in size with <50% respiratory  variability, suggesting right atrial pressure of 8 mmHg.      2 weeks event monitor 12/08/19: Max 234 bpm 02:47pm, 12/01 Min 46 bpm 11:11pm, 12/07 Avg 76 bpm Less than 1% ventricular and supraventricular ectopy The predominant underlying rhythm was sinus rhythm 2% atrial fibrillation burden with heart rates of 107 to 234 bpm Triggered events associated with both atrial fibrillation and SVT.     TTE 10/25/17 - Left ventricle: The cavity size was normal. Systolic function was   normal. The estimated ejection fraction was in the range of 55%   to 60%. Wall motion was normal; there were no regional  wall   motion abnormalities. Doppler parameters are consistent with   abnormal left ventricular relaxation (grade 1 diastolic   dysfunction). There was no evidence of elevated ventricular   filling pressure by Doppler parameters. - Aortic valve: There was no regurgitation. - Mitral valve: There was trivial regurgitation. - Right ventricle: The cavity size was normal. Wall thickness was   normal. Systolic function was normal. - Tricuspid valve: There was mild regurgitation. - Pulmonary arteries: Systolic pressure was within the normal   range. - Inferior vena cava: The vessel was normal in size. - Pericardium, extracardiac: There was no pericardial effusion.  Cardiac monitor 06/12/2018  Sinus rhythm with episodes of SVT    Laboratory Data:  High Sensitivity Troponin:   Recent Labs  Lab 12/18/20 1100 12/25/20 1221 12/25/20 1713  TROPONINIHS 9 11 11      Chemistry Recent Labs  Lab 12/20/20 1341 12/25/20 1221 12/26/20 0344  NA 139 132* 133*  K 4.2 4.3 4.1  CL 102 97* 100  CO2 28 26 22   GLUCOSE 100* 119*  101*  BUN 19 23 19   CREATININE 0.77 0.95 0.96  CALCIUM 8.5* 8.0* 8.2*  MG 1.2*  --   --   GFRNONAA >60 >60 >60  ANIONGAP 9 9 11     Recent Labs  Lab 12/20/20 1341 12/25/20 1221  PROT 6.1* 5.6*  ALBUMIN 3.4* 3.1*  AST 13* 50*  ALT 12 54*  ALKPHOS 81 105  BILITOT 0.5 0.5   Lipids No results for input(s): CHOL, TRIG, HDL, LABVLDL, LDLCALC, CHOLHDL in the last 168 hours.  Hematology Recent Labs  Lab 12/20/20 1341 12/25/20 1221  WBC 1.9* 0.7*  RBC 2.13* 2.41*  HGB 7.7* 8.0*  HCT 21.9* 23.8*  MCV 102.8* 98.8  MCH 36.2* 33.2  MCHC 35.2 33.6  RDW 15.4 17.3*  PLT 167 27*   Thyroid No results for input(s): TSH, FREET4 in the last 168 hours.  BNPNo results for input(s): BNP, PROBNP in the last 168 hours.  DDimer No results for input(s): DDIMER in the last 168 hours.   Radiology/Studies:  DG Chest 2 View  Result Date: 12/25/2020 CLINICAL DATA:  Fatigue and shortness of breath. Lung cancer patient receiving chemotherapy and radiation therapy. EXAM: CHEST - 2 VIEW COMPARISON:  12/18/2020.  CT, 12/08/2020. FINDINGS: Cardiac silhouette is normal in size. No mediastinal or hilar masses or evidence of adenopathy. Mild streaky opacity at the left lung base consistent with atelectasis or scarring. Lungs otherwise clear. Relative lucency in the upper lobes consistent with emphysema. Minimal pleural effusions.  No pneumothorax. Chronic compression fracture of L2.  No acute skeletal abnormality. IMPRESSION: 1. No acute cardiopulmonary disease. Electronically Signed   By: Lajean Manes M.D.   On: 12/25/2020 12:18     Assessment and Plan:   Paroxysmal AFib SVT AFlutter CHA2DS2Vasc is 5, on Eliquis Flecainide with recent up-titration, though has developed 1st degree Avblock, QRS looks ok  She got her PO lopressor and flecainide about an hour ago, Ulmer Degen see if this helps 1st We may need to consider alternative AAD, though not sure we have any other good alternatives Still has some exp  wheezes, Tywana Robotham use dilt BP looks ok  Treavon Castilleja see later this AM with Dr. Curt Bears     Risk Assessment/Risk Scores:    For questions or updates, please contact [place] Please consult [email or web address] for contact info under    Signed, Baldwin Jamaica, PA-C  12/26/2020 9:46 AM  I have seen and examined this  patient with Tommye Standard.  Agree with above, note added to reflect my findings.  She presented to the emergency room with progressive weakness and esophageal pain, shortness of breath.  She was admitted with a possible COPD exacerbation.  She arrived in sinus rhythm on flecainide, metoprolol, and Eliquis.  She received 2 breathing treatments, the second with Brovana.  Unfortunately she went back into atrial fibrillation after the second breathing treatment.  She feels weak and fatigued.  GEN: Well nourished, well developed, in no acute distress  HEENT: normal  Neck: no JVD, carotid bruits, or masses Cardiac: Irregular; no murmurs, rubs, or gallops,no edema  Respiratory:  clear to auscultation bilaterally, normal work of breathing GI: soft, nontender, nondistended, + BS MS: no deformity or atrophy  Skin: warm and dry Neuro:  Strength and sensation are intact Psych: euthymic mood, full affect   Paroxysmal atrial fibrillation: Currently on metoprolol and flecainide.  Unfortunately should go back into atrial fibrillation.  Have switch her to IV diltiazem for rate control.  Hopefully as her overall condition improves while she is in the hospital she Tyan Dy convert to normal rhythm.  Would prefer to avoid cardioversion if possible.  If further breathing treatments are required, would prefer Xopenex to albuterol.  Treyvion Durkee M. Hagan Vanauken MD 12/26/2020 10:49 AM

## 2020-12-27 ENCOUNTER — Inpatient Hospital Stay (HOSPITAL_COMMUNITY): Payer: Medicare HMO

## 2020-12-27 ENCOUNTER — Ambulatory Visit
Admission: RE | Admit: 2020-12-27 | Discharge: 2020-12-27 | Disposition: A | Discharge status: Home / Self Care | Payer: Medicare HMO | Source: Ambulatory Visit | Attending: Radiation Oncology | Admitting: Radiation Oncology

## 2020-12-27 ENCOUNTER — Inpatient Hospital Stay: Payer: Medicare HMO

## 2020-12-27 LAB — CBC WITH DIFFERENTIAL/PLATELET
Abs Immature Granulocytes: 0.14 10*3/uL — ABNORMAL HIGH (ref 0.00–0.07)
Basophils Absolute: 0 10*3/uL (ref 0.0–0.1)
Basophils Relative: 1 %
Eosinophils Absolute: 0 10*3/uL (ref 0.0–0.5)
Eosinophils Relative: 0 %
HCT: 24.4 % — ABNORMAL LOW (ref 36.0–46.0)
Hemoglobin: 8.5 g/dL — ABNORMAL LOW (ref 12.0–15.0)
Immature Granulocytes: 14 %
Lymphocytes Relative: 10 %
Lymphs Abs: 0.1 10*3/uL — ABNORMAL LOW (ref 0.7–4.0)
MCH: 32.2 pg (ref 26.0–34.0)
MCHC: 34.8 g/dL (ref 30.0–36.0)
MCV: 92.4 fL (ref 80.0–100.0)
Monocytes Absolute: 0.3 10*3/uL (ref 0.1–1.0)
Monocytes Relative: 27 %
Neutro Abs: 0.5 10*3/uL — ABNORMAL LOW (ref 1.7–7.7)
Neutrophils Relative %: 48 %
Platelets: 25 10*3/uL — CL (ref 150–400)
RBC: 2.64 MIL/uL — ABNORMAL LOW (ref 3.87–5.11)
RDW: 19 % — ABNORMAL HIGH (ref 11.5–15.5)
WBC: 1 10*3/uL — CL (ref 4.0–10.5)
nRBC: 1.9 % — ABNORMAL HIGH (ref 0.0–0.2)

## 2020-12-27 LAB — BASIC METABOLIC PANEL
Anion gap: 8 (ref 5–15)
BUN: 12 mg/dL (ref 8–23)
CO2: 25 mmol/L (ref 22–32)
Calcium: 8 mg/dL — ABNORMAL LOW (ref 8.9–10.3)
Chloride: 100 mmol/L (ref 98–111)
Creatinine, Ser: 0.8 mg/dL (ref 0.44–1.00)
GFR, Estimated: >60 mL/min (ref >60)
Glucose, Bld: 115 mg/dL — ABNORMAL HIGH (ref 70–99)
Potassium: 3.5 mmol/L (ref 3.5–5.1)
Sodium: 133 mmol/L — ABNORMAL LOW (ref 135–145)

## 2020-12-27 LAB — TYPE AND SCREEN
ABO/RH(D): O POS
Antibody Screen: NEGATIVE
Unit division: 0

## 2020-12-27 LAB — BPAM RBC
Blood Product Expiration Date: 202301022359
ISSUE DATE / TIME: 202212191734
Unit Type and Rh: 5100

## 2020-12-27 LAB — BPAM PLATELET PHERESIS
Blood Product Expiration Date: 202212192359
ISSUE DATE / TIME: 202212191530
Unit Type and Rh: 6200

## 2020-12-27 LAB — PATHOLOGIST SMEAR REVIEW

## 2020-12-27 LAB — PREPARE PLATELET PHERESIS: Unit division: 0

## 2020-12-27 LAB — MAGNESIUM: Magnesium: 1.3 mg/dL — ABNORMAL LOW (ref 1.7–2.4)

## 2020-12-27 MED ORDER — FUROSEMIDE 10 MG/ML IJ SOLN
20.0000 mg | Freq: Once | INTRAMUSCULAR | Status: AC
  Administered 2020-12-27: 12:00:00 20 mg via INTRAVENOUS
  Filled 2020-12-27: qty 2

## 2020-12-27 MED ORDER — IPRATROPIUM BROMIDE 0.02 % IN SOLN
0.5000 mg | Freq: Four times a day (QID) | RESPIRATORY_TRACT | Status: DC | PRN
  Administered 2020-12-28 (×2): 0.5 mg via RESPIRATORY_TRACT
  Filled 2020-12-27 (×3): qty 2.5

## 2020-12-27 MED ORDER — LEVALBUTEROL HCL 0.63 MG/3ML IN NEBU
0.6300 mg | INHALATION_SOLUTION | RESPIRATORY_TRACT | Status: DC | PRN
  Administered 2020-12-28 (×2): 0.63 mg via RESPIRATORY_TRACT
  Filled 2020-12-27 (×2): qty 3

## 2020-12-27 MED ORDER — MAGNESIUM SULFATE 4 GM/100ML IV SOLN
4.0000 g | Freq: Once | INTRAVENOUS | Status: AC
  Administered 2020-12-27: 13:00:00 4 g via INTRAVENOUS
  Filled 2020-12-27: qty 100

## 2020-12-27 MED ORDER — GUAIFENESIN-DM 100-10 MG/5ML PO SYRP
10.0000 mL | ORAL_SOLUTION | ORAL | Status: DC | PRN
  Administered 2020-12-27 – 2020-12-29 (×2): 10 mL via ORAL
  Filled 2020-12-27 (×2): qty 10

## 2020-12-27 MED ORDER — METHYLPREDNISOLONE SODIUM SUCC 125 MG IJ SOLR
60.0000 mg | INTRAMUSCULAR | Status: DC
  Administered 2020-12-27 – 2020-12-30 (×4): 60 mg via INTRAVENOUS
  Filled 2020-12-27 (×4): qty 2

## 2020-12-27 MED ORDER — GABAPENTIN 300 MG PO CAPS
600.0000 mg | ORAL_CAPSULE | Freq: Two times a day (BID) | ORAL | Status: DC
  Administered 2020-12-27 – 2021-01-03 (×14): 600 mg via ORAL
  Filled 2020-12-27 (×14): qty 2

## 2020-12-27 MED ORDER — SODIUM CHLORIDE 0.9 % IV SOLN: INTRAVENOUS | Status: DC | PRN

## 2020-12-27 MED ORDER — ORAL CARE MOUTH RINSE
15.0000 mL | Freq: Two times a day (BID) | OROMUCOSAL | Status: DC
  Administered 2020-12-27 – 2021-01-03 (×13): 15 mL via OROMUCOSAL

## 2020-12-27 NOTE — Progress Notes (Signed)
Pt transferring to [facility] via carelink at this time.  Report given to receiving nurse at [facility]. All questions answered.

## 2020-12-27 NOTE — Plan of Care (Addendum)
Repeat T+S ordered per SO/BPA. NP Chriss Driver per Epic chat at the patient's request; patient requesting to advance to regular diet from soft diet.   Problem: Urinary Elimination: Goal: Signs and symptoms of infection will decrease Outcome: Progressing   Problem: Education: Goal: Knowledge of General Education information will improve Description: Including pain rating scale, medication(s)/side effects and non-pharmacologic comfort measures Outcome: Progressing   Problem: Health Behavior/Discharge Planning: Goal: Ability to manage health-related needs will improve Outcome: Progressing   Problem: Clinical Measurements: Goal: Ability to maintain clinical measurements within normal limits will improve Outcome: Progressing Goal: Will remain free from infection Outcome: Progressing Goal: Diagnostic test results will improve Outcome: Progressing Goal: Respiratory complications will improve Outcome: Progressing Goal: Cardiovascular complication will be avoided Outcome: Progressing   Problem: Activity: Goal: Risk for activity intolerance will decrease Outcome: Progressing   Problem: Nutrition: Goal: Adequate nutrition will be maintained Outcome: Progressing   Problem: Coping: Goal: Level of anxiety will decrease Outcome: Progressing   Problem: Elimination: Goal: Will not experience complications related to bowel motility Outcome: Progressing Goal: Will not experience complications related to urinary retention Outcome: Progressing   Problem: Pain Managment: Goal: General experience of comfort will improve Outcome: Progressing   Problem: Safety: Goal: Ability to remain free from injury will improve Outcome: Progressing   Problem: Skin Integrity: Goal: Risk for impaired skin integrity will decrease Outcome: Progressing

## 2020-12-27 NOTE — Progress Notes (Signed)
Received patient to 1418, VS obtained, telemetry applied, oriented to unit , call light placed in reach

## 2020-12-27 NOTE — Progress Notes (Signed)
° ° °  OVERNIGHT PROGRESS REPORT   Notified by RN for patient requirements due to the medication (and it's titration) that is currently in use. Actions require a different monitoring than current location admitted to. Due to continued HR and need for titration of med, transfer ordered to required floor.   Gershon Cull MSNA MSN ACNPC-AG Acute Care Nurse Practitioner [place]

## 2020-12-27 NOTE — Progress Notes (Addendum)
Progress Note  Patient Name: Patricia [name] Date of Encounter: 12/27/2020  [facility] electrophysiologist: Dr. Curt Bears   Subjective   Still weak, tired, aware of her heart rate, coughing and wheezing "all morning", the new inhaler/treatments don't work as well for her  Inpatient Medications    Scheduled Meds:  arformoterol  15 mcg Nebulization BID   And   umeclidinium bromide  1 puff Inhalation Daily   carisoprodol  350 mg Oral TID   docusate sodium  100 mg Oral BID   escitalopram  10 mg Oral Daily   fluconazole  200 mg Oral Daily   gabapentin  600 mg Oral BID   linaclotide  145 mcg Oral q1600   loratadine  10 mg Oral Daily   mouth rinse  15 mL Mouth Rinse BID   pantoprazole  40 mg Oral Daily   polyethylene glycol  34 g Oral q AM   rosuvastatin  5 mg Oral Once per day on Mon Wed Fri   sodium chloride  1 g Oral TID   sucralfate  1 g Oral TID WC & HS   Tbo-filgastrim (GRANIX) SQ  480 mcg Subcutaneous q1800   traMADol  100 mg Oral Q8H   valACYclovir  1,000 mg Oral BID   Continuous Infusions:  sodium chloride 75 mL/hr at 12/27/20 0730   diltiazem (CARDIZEM) infusion 15 mg/hr (12/27/20 0916)   PRN Meds: acetaminophen **OR** acetaminophen, bisacodyl, clonazePAM, hydrALAZINE, HYDROcodone-acetaminophen, levalbuterol, lidocaine, morphine injection, ondansetron **OR** ondansetron (ZOFRAN) IV, polyvinyl alcohol, prochlorperazine, zolpidem   Vital Signs    Vitals:   12/27/20 0559 12/27/20 0636 12/27/20 0757 12/27/20 0803  BP: 120/88 (!) 135/107 (!) 154/108   Pulse: 94 (!) 111 (!) 109   Resp: 20 (!) 23 20   Temp: 98.4 F (36.9 C) 97.9 F (36.6 C) 97.7 F (36.5 C)   TempSrc: Oral Oral Oral   SpO2: 94% 96% 93% 96%  Weight:      Height:        Intake/Output Summary (Last 24 hours) at 12/27/2020 1022 Last data filed at 12/27/2020 0730 Gross per 24 hour  Intake 1825.46 ml  Output --  Net 1825.46 ml   Last 3 Weights 12/26/2020 12/25/2020 12/23/2020  Weight  (lbs) 204 lb 9.4 oz 189 lb 189 lb 4.8 oz  Weight (kg) 92.8 kg 85.73 kg 85.866 kg      Telemetry    AFib 110's-130's - Personally Reviewed  ECG    No new EKGs - Personally Reviewed  Physical Exam   GEN: No acute distress, chronically ill appearing, pallor.   Neck: No JVD Cardiac: irreg-irreg, tachycardic, no murmurs, rubs, or gallops.  Respiratory: coughing, b/l exp wheezes,  moving air  GI: Soft, nontender, non-distended  MS: No edema; No deformity. Neuro:  Nonfocal  Psych: Normal affect   Labs    High Sensitivity Troponin:   Recent Labs  Lab 12/18/20 1100 12/25/20 1221 12/25/20 1713  TROPONINIHS 9 11 11      Chemistry Recent Labs  Lab 12/20/20 1341 12/25/20 1221 12/26/20 0344 12/27/20 0328  NA 139 132* 133* 133*  K 4.2 4.3 4.1 3.5  CL 102 97* 100 100  CO2 28 26 22 25   GLUCOSE 100* 119* 101* 115*  BUN 19 23 19 12   CREATININE 0.77 0.95 0.96 0.80  CALCIUM 8.5* 8.0* 8.2* 8.0*  MG 1.2*  --   --  1.3*  PROT 6.1* 5.6*  --   --   ALBUMIN 3.4* 3.1*  --   --  AST 13* 50*  --   --   ALT 12 54*  --   --   ALKPHOS 81 105  --   --   BILITOT 0.5 0.5  --   --   GFRNONAA >60 >60 >60 >60  ANIONGAP 9 9 11 8     Lipids No results for input(s): CHOL, TRIG, HDL, LABVLDL, LDLCALC, CHOLHDL in the last 168 hours.  Hematology Recent Labs  Lab 12/25/20 1221 12/26/20 1000 12/27/20 0328  WBC 0.7* 0.6* 1.0*  RBC 2.41* 2.16* 2.64*  HGB 8.0* 7.4* 8.5*  HCT 23.8* 21.4* 24.4*  MCV 98.8 99.1 92.4  MCH 33.2 34.3* 32.2  MCHC 33.6 34.6 34.8  RDW 17.3* 17.2* 19.0*  PLT 27* 16* 25*   Thyroid No results for input(s): TSH, FREET4 in the last 168 hours.  BNPNo results for input(s): BNP, PROBNP in the last 168 hours.  DDimer No results for input(s): DDIMER in the last 168 hours.   Radiology    DG Chest 2 View  Result Date: 12/25/2020 CLINICAL DATA:  Fatigue and shortness of breath. Lung cancer patient receiving chemotherapy and radiation therapy. EXAM: CHEST - 2 VIEW  COMPARISON:  12/18/2020.  CT, 12/08/2020. FINDINGS: Cardiac silhouette is normal in size. No mediastinal or hilar masses or evidence of adenopathy. Mild streaky opacity at the left lung base consistent with atelectasis or scarring. Lungs otherwise clear. Relative lucency in the upper lobes consistent with emphysema. Minimal pleural effusions.  No pneumothorax. Chronic compression fracture of L2.  No acute skeletal abnormality. IMPRESSION: 1. No acute cardiopulmonary disease. Electronically Signed   By: Lajean Manes M.D.   On: 12/25/2020 12:18    Cardiac Studies    10/01/20: TTE  1. Left ventricular ejection fraction, by estimation, is 60 to 65%. The  left ventricle has normal function. The left ventricle has no regional  wall motion abnormalities. Left ventricular diastolic parameters are  consistent with Grade I diastolic dysfunction (impaired relaxation).   2. Right ventricular systolic function is normal. The right ventricular  size is normal.   3. Prominent epicardial fat.   4. The mitral valve is normal in structure. No evidence of mitral valve  regurgitation. No evidence of mitral stenosis.   5. The aortic valve was not well visualized. There is mild calcification  of the aortic valve. Aortic valve regurgitation is not visualized. Mild  aortic valve sclerosis is present, with no evidence of aortic valve  stenosis.   6. The inferior vena cava is normal in size with <50% respiratory  variability, suggesting right atrial pressure of 8 mmHg.      2 weeks event monitor 12/08/19: Max 234 bpm 02:47pm, 12/01 Min 46 bpm 11:11pm, 12/07 Avg 76 bpm Less than 1% ventricular and supraventricular ectopy The predominant underlying rhythm was sinus rhythm 2% atrial fibrillation burden with heart rates of 107 to 234 bpm Triggered events associated with both atrial fibrillation and SVT.     TTE 10/25/17 - Left ventricle: The cavity size was normal. Systolic function was   normal. The estimated  ejection fraction was in the range of 55%   to 60%. Wall motion was normal; there were no regional wall   motion abnormalities. Doppler parameters are consistent with   abnormal left ventricular relaxation (grade 1 diastolic   dysfunction). There was no evidence of elevated ventricular   filling pressure by Doppler parameters. - Aortic valve: There was no regurgitation. - Mitral valve: There was trivial regurgitation. - Right ventricle: The cavity  size was normal. Wall thickness was   normal. Systolic function was normal. - Tricuspid valve: There was mild regurgitation. - Pulmonary arteries: Systolic pressure was within the normal   range. - Inferior vena cava: The vessel was normal in size. - Pericardium, extracardiac: There was no pericardial effusion.   Cardiac monitor 06/12/2018  Sinus rhythm with episodes of SVT  Patient Profile     70 y.o. female hx of HTN, HLD, TIA, IBS, SVT, AFib, chronic back pain, lung Ca (non-operable) admitted with progressive weakness, SOB   She ARRIVED in SR though in the ER after some resp treatments reverted to AFib w/RVR  AAD hx  Started on Flecainide 12/20/20 > up-titrated 12/20/20   Assessment & Plan    Paroxysmal AFib SVT AFlutter CHA2DS2Vasc is 5, on Eliquis out patient though held with worsening pancytopenia  Rates still not well controlled, Zayli Villafuerte increase dilt gtt rate Stop flecainide given off a/c  Dr. Junius Finner has seen ann examined the patient this AM   4. Coughing/SOB I have reached out to attending to revisit her inhalers/nebs RN Davone Shinault call respiratory for treatment if available I Schuyler Behan give a single dose of lasix given RVR, blood yesterday and dilt gtt for perhaps some volume OL with some reports of feeling bloated    Further as per IM and oncology service  For questions or updates, please contact [place] Please consult [email or web address] for contact info under        Signed, Baldwin Jamaica, PA-C  12/27/2020, 10:22  AM    I have seen and examined this patient with Tommye Standard.  Agree with above, note added to reflect my findings.  Continues to feel weak.  Remains in rapid atrial fibrillation.  GEN: Well nourished, well developed, in no acute distress  HEENT: normal  Neck: no JVD, carotid bruits, or masses Cardiac: Irregular; no murmurs, rubs, or gallops,no edema  Respiratory:  clear to auscultation bilaterally, normal work of breathing GI: soft, nontender, nondistended, + BS MS: no deformity or atrophy  Skin: warm and dry Neuro:  Strength and sensation are intact Psych: euthymic mood, full affect   Persistent atrial fibrillation: Unfortunately patient's Eliquis was stopped due to thrombocytopenia.  We Devanny Palecek plan for rate control strategy.  We Anaysha Andre increase diltiazem to 15 mg.  If this controls her rate well, we Maybel Dambrosio switch her to p.o. medications.  Tani Virgo M. Marin Wisner MD 12/27/2020 11:50 AM

## 2020-12-27 NOTE — Progress Notes (Signed)
°   12/27/20 0347 12/27/20 0428 12/27/20 0559  Assess: MEWS Score  Temp 98.2 F (36.8 C) 98.1 F (36.7 C) 98.4 F (36.9 C)  BP (!) 144/83 (!) 132/99 120/88  Pulse Rate (!) 113 (!) 117 94  Resp 17 20 20   SpO2 96 % 95 % 94 %  O2 Device  --  Nasal Cannula Nasal Cannula  O2 Flow Rate (L/min)  --  3 L/min 3 L/min  Assess: MEWS Score  MEWS Temp 0 0 0  MEWS Systolic 0 0 0  MEWS Pulse 2 2 0  MEWS RR 0 0 0  MEWS LOC 0 0 0  MEWS Score 2 2 0  MEWS Score Color Yellow Yellow Green  Assess: if the MEWS score is Yellow or Red  Were vital signs taken at a resting state? Yes Yes  --   Focused Assessment No change from prior assessment No change from prior assessment  --   Early Detection of Sepsis Score *See Row Information* Medium Medium  --   MEWS guidelines implemented *See Row Information* Yes  --   --   Treat  MEWS Interventions Other (Comment) (titrating cardizem) Other (Comment) (titrated cardizem up 2.5mg  more total 10mg /hr)  --   Take Vital Signs  Increase Vital Sign Frequency  Yellow: Q 2hr X 2 then Q 4hr X 2, if remains yellow, continue Q 4hrs  --   --   Escalate  MEWS: Escalate Yellow: discuss with charge nurse/RN and consider discussing with provider and RRT  --   --   Notify: Charge Nurse/RN  Name of Charge Nurse/RN Notified Lourdes Rn (attempted notify x 1 0411)  --   --   Date Charge Nurse/RN Notified 12/27/20  --   --   Time Charge Nurse/RN Notified 0411  --   --   Notify: Provider  Provider Name/Title Clarene Essex NP  --   --   Date Provider Notified 12/27/20  --   --   Time Provider Notified [phone]  --   --   Notification Type  (amion)  --   --   Notification Reason Other (Comment) (mews and not allowed to be on unit with cardizem drip)  --   --   Provider response See new orders (transfer orders)  --   --   Date of Provider Response 12/27/20  --   --   Time of Provider Response [phone]  --   --   Document  Patient Outcome Transferred/level of care increased  --   --    Progress note created (see row info) Yes  --   --

## 2020-12-27 NOTE — Progress Notes (Signed)
PROGRESS NOTE    Patricia [name]  PNT:[id number] DOB: [birth year] DOA: 12/25/2020 PCP: [name], MD  Chief Complaint  Patient presents with   Weakness    Brief Narrative:   Patricia [name] is a 70 y.o. female with medical history significant of HTN; HLD; aflutter scheduled for cardioversion next month; lung cancer (10/2020); and low back pain presenting with fatigue, SOB. she was found to be pancytopenic, on labs on admission. CXR does not show any pneumonia. UA is negative for infection.  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  Requiring about 3 lit of [place] oxygen, tachypnea.  CXR is unremarkable. If her respiratory symptoms do not improve, will move forward with a CT chest for further evaluation.  Continue with bronchodilators and added IV solumedrol 60 mg daily as she is diffusely wheezing     Atrial fibrillation with RVR Rate uncontrolled 120 to 170/min. On flecainide 100 mg BID and metoprolol 100 mg BID. Started her on IV cardizem. Cardiology consulted and continue to follow . Last echocardiogram from 9/22 showed Left ventricular ejection fraction, by estimation, is 60 to 65%. The left ventricle has normal function. The left ventricle has no regional  wall motion abnormalities. Left ventricular diastolic parameters are  consistent with Grade I diastolic  dysfunction (impaired relaxation). Eliquis on hold due to severe thrombocytopenia from the chemotherapy.     Hypertension:  - BP  parameters are optimal.   Primary small cell ca of lower lobe of left lung:  S/p chemoradiation with cisplatin and etoposide. S/p 3 cycles.  S/p chemo last Wednesday.  Now presents with Pancytopenia; she underwent 1 unit of prbc and 1 unit of platelets, with improvement in the counts. Hemoglobin around 8.5, platelets improved to 25,000.  ANC improved from 300 to 500.  On Granix injections.  Oncology on board.    Hyperlipidemia:  Resume statin.    H/o esophagitis:  Currently on diflucan, Protonix.  Continue the same, .    Adjustment disorder:  Lexapro started this admission, meanwhile she is on  klonopin, continue the same. .    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    Chronic pain syndrome; Resume home meds.   Diarrhea  Resolved.    Mild hyponatremia:  Monitor.    Elevated liver enzymes:  She denies any  nausea, vomiting or abd pain.  Monitor.     Anemia of chronic disease:  Normocytic anemia.  Baseline hemoglobin appears to be around 8.  Currently at 8.  Transfuse to keep hemoglobin greater than 7.    Hypomagnesemia:  Replaced.     DVT prophylaxis: eliquis ( on hold for low platelet count) Code Status: (DNR) Family Communication: none at bedside.  Disposition:   Status is: Inpatient  Remains inpatient appropriate because: acute respiratory failure with hypoxia.        Consultants:  Cardiology Oncology  Procedures: none.  Antimicrobials: Antibiotics Given (last 72 hours)     Date/Time Action Medication Dose Rate   12/25/20 1751  New Bag/Given   cefTRIAXone (ROCEPHIN) 2 g in sodium chloride 0.9 % 100 mL IVPB 2 g 200 mL/hr   12/25/20 1955 Given   valACYclovir (VALTREX) tablet 1,000 mg 1,000 mg    12/26/20 0254 Given   valACYclovir (VALTREX) tablet 1,000 mg 1,000 mg    12/26/20 2208 Given   valACYclovir (VALTREX) tablet 1,000 mg 1,000 mg    12/27/20 2706 Given   valACYclovir (VALTREX) tablet 1,000 mg 1,000 mg           Subjective: Sob and coughing. No chest pain today.   Objective: Vitals:   12/27/20 0757 12/27/20 0803 12/27/20 1101 12/27/20 1115  BP: (!) 154/108   140/81  Pulse: (!) 109   (!) 110  Resp: 20   20  Temp: 97.7 F (36.5 C)   97.9 F (36.6 C)  TempSrc: Oral   Oral  SpO2: 93% 96% 97% 92%  Weight:      Height:        Intake/Output Summary (Last 24 hours) at 12/27/2020 1200 Last data filed at 12/27/2020 0730 Gross per 24 hour  Intake 1825.46 ml  Output --  Net 1825.46 ml    Filed Weights   12/25/20 1226 12/26/20 2037  Weight: 85.7 kg 92.8 kg    Examination:  General exam: elderly woman, not in distress  Respiratory system: wheezing diffusely anteriorly and posteriorly. Air entry fair. On  oxygen 2 lit/min.  Cardiovascular system: S1 & S2 heard, irregularly irregular. No pedal edema. Gastrointestinal system: Abdomen is nondistended, soft and nontender.  Normal bowel sounds heard. Central nervous system: Alert and oriented. No focal neurological deficits. Extremities: Symmetric 5 x 5 power. Skin: No rashes, lesions or ulcers Psychiatry: Mood & affect appropriate.      Data Reviewed: I have personally reviewed following labs and imaging studies  CBC: Recent Labs  Lab 12/20/20 1341 12/25/20 1221 12/26/20 1000 12/27/20 0328  WBC 1.9* 0.7* 0.6* 1.0*  NEUTROABS 1.6* 0.4* 0.3* 0.5*  HGB 7.7* 8.0* 7.4* 8.5*  HCT 21.9* 23.8* 21.4* 24.4*  MCV 102.8* 98.8 99.1 92.4  PLT 167 27* 16* 25*     Basic Metabolic Panel: Recent Labs  Lab 12/20/20 1341 12/25/20 1221 12/26/20 0344 12/27/20 0328  NA 139 132* 133* 133*  K 4.2 4.3 4.1 3.5  CL 102 97* 100 100  CO2 28 26 22 25   GLUCOSE 100* 119* 101* 115*  BUN 19 23 19 12   CREATININE 0.77 0.95 0.96 0.80  CALCIUM 8.5* 8.0* 8.2* 8.0*  MG 1.2*  --   --  1.3*     GFR: Estimated Creatinine Clearance: 74 mL/min (by C-G formula based on SCr of 0.8 mg/dL).  Liver Function Tests: Recent Labs  Lab 12/20/20 1341  12/25/20 1221  AST 13* 50*  ALT 12 54*  ALKPHOS 81 105  BILITOT 0.5 0.5  PROT 6.1* 5.6*  ALBUMIN 3.4* 3.1*     CBG: Recent Labs  Lab 12/25/20 1248  GLUCAP 111*      Recent Results (from the past 240 hour(s))  Blood culture (routine x 2)     Status: None (Preliminary result)   Collection Time: 12/25/20 11:57 AM   Specimen: BLOOD  Result Value Ref Range Status   Specimen Description BLOOD LEFT ANTECUBITAL  Final   Special Requests   Final    BOTTLES DRAWN AEROBIC AND ANAEROBIC Blood Culture results may not be optimal due to an inadequate volume of blood received in culture bottles   Culture  Final    NO GROWTH 2 DAYS Performed at [facility], [place] [address], [place], [place] [address]    Report Status PENDING  Incomplete  Resp Panel by RT-PCR (Flu A&B, Covid) Nasopharyngeal Swab     Status: None   Collection Time: 12/25/20  8:34 PM   Specimen: Nasopharyngeal Swab; Nasopharyngeal(NP) swabs in vial transport medium  Result Value Ref Range Status   SARS Coronavirus 2 by RT PCR NEGATIVE NEGATIVE Final    Comment: (NOTE) SARS-CoV-2 target nucleic acids are NOT DETECTED.  The SARS-CoV-2 RNA is generally detectable in upper respiratory specimens during the acute phase of infection. The lowest concentration of SARS-CoV-2 viral copies this assay can detect is 138 copies/mL. A negative result does not preclude SARS-Cov-2 infection and should not be used as the sole basis for treatment or other patient management decisions. A negative result may occur with  improper specimen collection/handling, submission of specimen other than nasopharyngeal swab, presence of viral mutation(s) within the areas targeted by this assay, and inadequate number of viral copies(<138 copies/mL). A negative result must be combined with clinical observations, patient history, and epidemiological information. The expected result is Negative.  Fact Sheet for Patients:   EntrepreneurPulse.com.au  Fact Sheet for Healthcare Providers:  IncredibleEmployment.be  This test is no t yet approved or cleared by the [place] FDA and  has been authorized for detection and/or diagnosis of SARS-CoV-2 by FDA under an Emergency Use Authorization (EUA). This EUA will remain  in effect (meaning this test can be used) for the duration of the COVID-19 declaration under Section 564(b)(1) of the Act, 21 U.S.C.section 360bbb-3(b)(1), unless the authorization is terminated  or revoked sooner.       Influenza A by PCR NEGATIVE NEGATIVE Final   Influenza B by PCR NEGATIVE NEGATIVE Final    Comment: (NOTE) The Xpert Xpress SARS-CoV-2/FLU/RSV plus assay is intended as an aid in the diagnosis of influenza from Nasopharyngeal swab specimens and should not be used as a sole basis for treatment. Nasal washings and aspirates are unacceptable for Xpert Xpress SARS-CoV-2/FLU/RSV testing.  Fact Sheet for Patients: EntrepreneurPulse.com.au  Fact Sheet for Healthcare Providers: IncredibleEmployment.be  This test is not yet approved or cleared by the [place] FDA and has been authorized for detection and/or diagnosis of SARS-CoV-2 by FDA under an Emergency Use Authorization (EUA). This EUA will remain in effect (meaning this test can be used) for the duration of the COVID-19 declaration under Section 564(b)(1) of the Act, 21 U.S.C. section 360bbb-3(b)(1), unless the authorization is terminated or revoked.  Performed at [facility], [place] [address], [place], [place] [address]           Radiology Studies: DG Chest 2 View  Result Date: 12/25/2020 CLINICAL DATA:  Fatigue and shortness of breath. Lung cancer patient receiving chemotherapy and radiation therapy. EXAM: CHEST - 2 VIEW COMPARISON:  12/18/2020.  CT, 12/08/2020. FINDINGS: Cardiac silhouette is normal in size. No mediastinal or  hilar masses or evidence of adenopathy. Mild streaky opacity at the left lung base consistent with atelectasis or scarring. Lungs otherwise clear. Relative lucency in the upper lobes consistent with emphysema. Minimal pleural effusions.  No pneumothorax. Chronic compression fracture of L2.  No acute skeletal abnormality. IMPRESSION: 1. No acute cardiopulmonary disease. Electronically Signed   By: Lajean Manes M.D.   On: 12/25/2020 12:18        Scheduled Meds:  carisoprodol  350 mg Oral TID   docusate sodium  100 mg Oral BID   escitalopram  10 mg Oral Daily   fluconazole  200 mg Oral Daily   gabapentin  600 mg Oral BID   linaclotide  145 mcg Oral q1600   loratadine  10 mg Oral Daily   mouth rinse  15 mL Mouth Rinse BID   methylPREDNISolone (SOLU-MEDROL) injection  60 mg Intravenous Q24H   pantoprazole  40 mg Oral Daily   polyethylene glycol  34 g Oral q AM   rosuvastatin  5 mg Oral Once per day on Mon Wed Fri   sodium chloride  1 g Oral TID   sucralfate  1 g Oral TID WC & HS   Tbo-filgastrim (GRANIX) SQ  480 mcg Subcutaneous q1800   traMADol  100 mg Oral Q8H   valACYclovir  1,000 mg Oral BID   Continuous Infusions:  diltiazem (CARDIZEM) infusion 15 mg/hr (12/27/20 0916)   magnesium sulfate bolus IVPB       LOS: 2 days        Hosie Poisson, MD [facility]   To contact the attending provider between 7A-7P or the covering provider during after hours 7P-7A, please log into the web site [email or web address] and access using universal  password for that web site. If you do not have the password, please call the hospital operator.  12/27/2020, 12:00 PM

## 2020-12-27 NOTE — Plan of Care (Signed)
  Problem: Education: Goal: Knowledge of General Education information will improve Description Including pain rating scale, medication(s)/side effects and non-pharmacologic comfort measures Outcome: Progressing   

## 2020-12-28 ENCOUNTER — Ambulatory Visit
Admission: RE | Admit: 2020-12-28 | Discharge: 2020-12-28 | Disposition: A | Discharge status: Home / Self Care | Payer: Medicare HMO | Source: Ambulatory Visit | Attending: Radiation Oncology | Admitting: Radiation Oncology

## 2020-12-28 DIAGNOSIS — I4819 Other persistent atrial fibrillation: Secondary | ICD-10-CM

## 2020-12-28 LAB — CBC WITH DIFFERENTIAL/PLATELET
Abs Immature Granulocytes: 0 10*3/uL (ref 0.00–0.07)
Basophils Absolute: 0 10*3/uL (ref 0.0–0.1)
Basophils Relative: 1 %
Eosinophils Absolute: 0 10*3/uL (ref 0.0–0.5)
Eosinophils Relative: 0 %
HCT: 25.3 % — ABNORMAL LOW (ref 36.0–46.0)
Hemoglobin: 8.9 g/dL — ABNORMAL LOW (ref 12.0–15.0)
Immature Granulocytes: 0 %
Lymphocytes Relative: 3 %
Lymphs Abs: 0.1 10*3/uL — ABNORMAL LOW (ref 0.7–4.0)
MCH: 33.2 pg (ref 26.0–34.0)
MCHC: 35.2 g/dL (ref 30.0–36.0)
MCV: 94.4 fL (ref 80.0–100.0)
Monocytes Absolute: 0.4 10*3/uL (ref 0.1–1.0)
Monocytes Relative: 14 %
Neutro Abs: 2.5 10*3/uL (ref 1.7–7.7)
Neutrophils Relative %: 82 %
Platelets: 29 10*3/uL — CL (ref 150–400)
RBC: 2.68 MIL/uL — ABNORMAL LOW (ref 3.87–5.11)
RDW: 18.4 % — ABNORMAL HIGH (ref 11.5–15.5)
WBC: 3 10*3/uL — ABNORMAL LOW (ref 4.0–10.5)
nRBC: 1 % — ABNORMAL HIGH (ref 0.0–0.2)

## 2020-12-28 LAB — TYPE AND SCREEN
ABO/RH(D): O POS
Antibody Screen: NEGATIVE

## 2020-12-28 LAB — BASIC METABOLIC PANEL
Anion gap: 9 (ref 5–15)
BUN: 13 mg/dL (ref 8–23)
CO2: 28 mmol/L (ref 22–32)
Calcium: 8.4 mg/dL — ABNORMAL LOW (ref 8.9–10.3)
Chloride: 95 mmol/L — ABNORMAL LOW (ref 98–111)
Creatinine, Ser: 0.75 mg/dL (ref 0.44–1.00)
GFR, Estimated: >60 mL/min (ref >60)
Glucose, Bld: 139 mg/dL — ABNORMAL HIGH (ref 70–99)
Potassium: 3.4 mmol/L — ABNORMAL LOW (ref 3.5–5.1)
Sodium: 132 mmol/L — ABNORMAL LOW (ref 135–145)

## 2020-12-28 MED ORDER — METOPROLOL TARTRATE 25 MG PO TABS
25.0000 mg | ORAL_TABLET | Freq: Two times a day (BID) | ORAL | Status: DC
  Administered 2020-12-28 – 2020-12-29 (×3): 25 mg via ORAL
  Filled 2020-12-28 (×3): qty 1

## 2020-12-28 MED ORDER — IPRATROPIUM BROMIDE 0.02 % IN SOLN
0.5000 mg | Freq: Three times a day (TID) | RESPIRATORY_TRACT | Status: DC
  Administered 2020-12-28 – 2021-01-03 (×17): 0.5 mg via RESPIRATORY_TRACT
  Filled 2020-12-28 (×18): qty 2.5

## 2020-12-28 MED ORDER — POTASSIUM CHLORIDE 20 MEQ PO PACK
40.0000 meq | PACK | Freq: Once | ORAL | Status: AC
  Administered 2020-12-28: 09:00:00 40 meq via ORAL
  Filled 2020-12-28: qty 2

## 2020-12-28 MED ORDER — DILTIAZEM HCL 90 MG PO TABS
90.0000 mg | ORAL_TABLET | Freq: Four times a day (QID) | ORAL | Status: DC
  Administered 2020-12-28 – 2020-12-29 (×4): 90 mg via ORAL
  Filled 2020-12-28 (×4): qty 1

## 2020-12-28 MED ORDER — LEVALBUTEROL HCL 0.63 MG/3ML IN NEBU
0.6300 mg | INHALATION_SOLUTION | Freq: Three times a day (TID) | RESPIRATORY_TRACT | Status: DC
  Administered 2020-12-28 – 2021-01-03 (×17): 0.63 mg via RESPIRATORY_TRACT
  Filled 2020-12-28 (×18): qty 3

## 2020-12-28 NOTE — Progress Notes (Signed)
PROGRESS NOTE    Patricia [name]  NIO:[id number] DOB: 05-25-1950 DOA: 12/25/2020 PCP: [name], MD    Brief Narrative:  This 70 y.o. female with PMH significant of HTN; HLD; A. flutter scheduled for cardioversion next month; lung cancer (10/2020); and low back pain presenting with fatigue, SOB. She was found to be pancytopenic, on labs on admission. CXR does not show any pneumonia. UA is negative for infection.  She was found to be in atrial fibrillation with RVR.  Cardiology consulted, medications adjusted and  heart rate is reasonably controlled.  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 Chest x-ray unremarkable.  Continue bronchodilators. Continue IV Solu-Medrol 60 mg daily. Consider CT chest if she continues to have difficulty breathing.  Atrial fibrillation with RVR: Heart rate now reasonably controlled. Continue Cardizem 90 mg every 6 hours.  Continue metoprolol 25 mg twice daily. Last echocardiogram from 9/22 showed Left ventricular ejection fraction, by estimation, is 60 to 65%.  Eliquis on hold due to severe thrombocytopenia from the chemotherapy.     Primary Small cell lung CA left lung: S/p chemoradiation with cisplatin and etoposide . S/p 3 cycle. She presented with pancytopenia requiring 1 unit of PRBC and platelets. Hb improved 8.9, platelet 29K, ANC improved to 500 She remains on Granix injections.  Oncology is on the board   Essential hypertension: Blood pressure  controlled  Hyperlipidemia: Continue statins  History of esophagitis: Continue Diflucan and Protonix   Adjustment disorder: Continue Lexapro and Klonopin   Genital HSV: Continue Valtrex   Chronic pain syndrome: Continue home medications   Diarrhea> Resolved  Elevated liver enzymes: Continue to monitor     Anemia of chronic disease: Baseline hemoglobin around 8  Remains at baseline.  Transfusion threshold less than 7   Hypomagnesemia : Replaced.   DVT prophylaxis:  SCDs Code Status:  DNR Family Communication:  No family at bed side. Disposition Plan:   Status is: Inpatient  Remains inpatient appropriate because: Admitted for acute hypoxic respiratory failure and A. fib with RVR requiring IV Cardizem.  Consultants:  Cardiology Oncology  Procedures: None  Antimicrobials:   Anti-infectives (From admission, onward)    Start     Dose/Rate Route Frequency Ordered Stop   12/26/20 1000  fluconazole (DIFLUCAN) tablet 200 mg       Note to Pharmacy: Take 400 mg on first day, then 200 mg per day     200 mg Oral Daily 12/25/20 1700     12/25/20 1730  valACYclovir (VALTREX) tablet 1,000 mg        1,000 mg Oral 2 times daily 12/25/20 1724     12/25/20 1715  valACYclovir (VALTREX) tablet 1,000 mg  Status:  Discontinued        1,000 mg Oral 3 times daily 12/25/20 1700 12/25/20 1724   12/25/20 1545  cefTRIAXone (ROCEPHIN) 2 g in sodium chloride 0.9 % 100 mL IVPB        2 g 200 mL/hr over 30 Minutes Intravenous  Once 12/25/20 1541  12/26/20 0006        Subjective: Patient was seen and examined at bedside.  Overnight events noted.   Patient reports feeling better.  She appears very deconditioned,  denies any chest pain but reports having progressive cough.  Objective: Vitals:   12/28/20 0822 12/28/20 1218 12/28/20 1324 12/28/20 1328  BP: 129/73 120/63    Pulse: 77 81    Resp: 16     Temp:      TempSrc:      SpO2: 95%  95% 95%  Weight:      Height:         Intake/Output Summary (Last 24 hours) at 12/28/2020 1341 Last data filed at 12/28/2020 0846 Gross per 24 hour  Intake 1012.35 ml  Output --  Net 1012.35 ml   Filed Weights   12/25/20 1226 12/26/20 2037  Weight: 85.7 kg 92.8 kg    Examination:  General exam: Appears comfortable, not in any acute distress.  Deconditioned Respiratory system: Clear to auscultation bilaterally, no wheezing, no crackles. Cardiovascular system: S1 & S2 heard, Irregular rhythm, no murmur. Gastrointestinal system: Abdominal soft, nontender, nondistended, BS+ Central nervous system: Alert and oriented x 2. No focal neurological deficits. Extremities: No edema, no cyanosis, no clubbing. Skin: No rashes, lesions or ulcers Psychiatry: Judgement and insight appear normal. Mood & affect appropriate.     Data Reviewed: I have personally reviewed following labs and imaging studies  CBC: Recent Labs  Lab 12/25/20 1221 12/26/20 1000 12/27/20 0328 12/28/20 0511  WBC 0.7* 0.6* 1.0* 3.0*  NEUTROABS 0.4* 0.3* 0.5* 2.5  HGB 8.0* 7.4* 8.5* 8.9*  HCT 23.8* 21.4* 24.4* 25.3*  MCV 98.8 99.1 92.4 94.4  PLT 27* 16* 25* 29*   Basic Metabolic Panel: Recent Labs  Lab 12/25/20 1221 12/26/20 0344 12/27/20 0328 12/28/20 0511  NA 132* 133* 133* 132*  K 4.3 4.1 3.5 3.4*  CL 97* 100 100 95*  CO2 26 22 25 28   GLUCOSE 119* 101* 115* 139*  BUN 23 19 12 13   CREATININE 0.95 0.96 0.80 0.75  CALCIUM 8.0* 8.2* 8.0* 8.4*  MG  --   --  1.3*  --    GFR: Estimated Creatinine Clearance: 74 mL/min (by C-G formula based on SCr of 0.75 mg/dL). Liver Function Tests: Recent Labs  Lab 12/25/20 1221  AST 50*  ALT 54*  ALKPHOS 105  BILITOT 0.5  PROT 5.6*  ALBUMIN 3.1*   No results for input(s): LIPASE, AMYLASE in the last 168 hours. No results for input(s): AMMONIA in the last 168 hours. Coagulation Profile: Recent Labs  Lab 12/25/20 1221  INR 1.3*   Cardiac Enzymes: No results for input(s): CKTOTAL, CKMB,  CKMBINDEX, TROPONINI in the last 168 hours. BNP (last 3 results) No results for input(s): PROBNP in the last 8760 hours. HbA1C: No results for input(s): HGBA1C in the last 72 hours. CBG: Recent Labs  Lab 12/25/20 1248  GLUCAP 111*   Lipid Profile: No results for input(s): CHOL, HDL, LDLCALC, TRIG, CHOLHDL, LDLDIRECT in the last 72 hours. Thyroid Function Tests: No results for input(s): TSH, T4TOTAL, FREET4, T3FREE, THYROIDAB in the last 72 hours. Anemia Panel: No results for input(s): VITAMINB12, FOLATE, FERRITIN, TIBC, IRON, RETICCTPCT in the last 72 hours. Sepsis Labs: Recent Labs  Lab 12/25/20 1331 12/25/20 1713  LATICACIDVEN 1.1 1.8    Recent Results (from the past 240 hour(s))  Blood culture (routine x 2)     Status: None (Preliminary result)   Collection Time: 12/25/20 11:57  AM   Specimen: BLOOD  Result Value Ref Range Status   Specimen Description BLOOD LEFT ANTECUBITAL  Final   Special Requests   Final    BOTTLES DRAWN AEROBIC AND ANAEROBIC Blood Culture results may not be optimal due to an inadequate volume of blood received in culture bottles   Culture   Final    NO GROWTH 3 DAYS Performed at [facility], [place] [address], [place], [place] [address]    Report Status PENDING  Incomplete  Resp Panel by RT-PCR (Flu A&B, Covid) Nasopharyngeal Swab     Status: None   Collection Time: 12/25/20  8:34 PM   Specimen: Nasopharyngeal Swab; Nasopharyngeal(NP) swabs in vial transport medium  Result Value Ref Range Status   SARS Coronavirus 2 by RT PCR NEGATIVE NEGATIVE Final    Comment: (NOTE) SARS-CoV-2 target nucleic acids are NOT DETECTED.  The SARS-CoV-2 RNA is generally detectable in upper respiratory specimens during the acute phase of infection. The lowest concentration of SARS-CoV-2 viral copies this assay can detect is 138 copies/mL. A negative result does not preclude SARS-Cov-2 infection and should not be used as the sole basis for treatment or other  patient management decisions. A negative result may occur with  improper specimen collection/handling, submission of specimen other than nasopharyngeal swab, presence of viral mutation(s) within the areas targeted by this assay, and inadequate number of viral copies(<138 copies/mL). A negative result must be combined with clinical observations, patient history, and epidemiological information. The expected result is Negative.  Fact Sheet for Patients:  EntrepreneurPulse.com.au  Fact Sheet for Healthcare Providers:  IncredibleEmployment.be  This test is no t yet approved or cleared by the [place] FDA and  has been authorized for detection and/or diagnosis of SARS-CoV-2 by FDA under an Emergency Use Authorization (EUA). This EUA will remain  in effect (meaning this test can be used) for the duration of the COVID-19 declaration under Section 564(b)(1) of the Act, 21 U.S.C.section 360bbb-3(b)(1), unless the authorization is terminated  or revoked sooner.       Influenza A by PCR NEGATIVE NEGATIVE Final   Influenza B by PCR NEGATIVE NEGATIVE Final    Comment: (NOTE) The Xpert Xpress SARS-CoV-2/FLU/RSV plus assay is intended as an aid in the diagnosis of influenza from Nasopharyngeal swab specimens and should not be used as a sole basis for treatment. Nasal washings and aspirates are unacceptable for Xpert Xpress SARS-CoV-2/FLU/RSV testing.  Fact Sheet for Patients: EntrepreneurPulse.com.au  Fact Sheet for Healthcare Providers: IncredibleEmployment.be  This test is not yet approved or cleared by the [place] FDA and has been authorized for detection and/or diagnosis of SARS-CoV-2 by FDA under an Emergency Use Authorization (EUA). This EUA will remain in effect (meaning this test can be used) for the duration of the COVID-19 declaration under Section 564(b)(1) of the Act, 21 U.S.C. section  360bbb-3(b)(1), unless the authorization is terminated or revoked.  Performed at [facility], [place] [address], [place], [place] [address]   Blood culture (routine x 2)     Status: None (Preliminary result)   Collection Time: 12/27/20  3:26 PM   Specimen: BLOOD  Result Value Ref Range Status   Specimen Description   Final    BLOOD BLOOD RIGHT HAND Performed at [facility] [address], [place], [place] [address]    Special Requests   Final    BOTTLES DRAWN AEROBIC ONLY Blood Culture adequate volume Performed at [facility]  [address] [place], [place] [address]    Culture   Final    NO GROWTH < 24 HOURS Performed at [facility] [address], [place], [place] [address]    Report Status PENDING  Incomplete         Radiology Studies: CT CHEST WO CONTRAST  Result Date: 12/27/2020 CLINICAL DATA:  Respiratory illness. Shortness of breath and tachypnea. Ongoing treatments for lung cancer. EXAM: CT CHEST WITHOUT CONTRAST TECHNIQUE: Multidetector CT imaging of the chest was performed following the standard protocol without IV contrast. COMPARISON:  CT of the chest 12/08/2020. FINDINGS: Cardiovascular: Heart is borderline enlarged, unchanged. Aorta is normal in size. There are atherosclerotic calcifications of the aorta and coronary arteries. There is no pericardial effusion. Mediastinum/Nodes: There are no enlarged mediastinal or hilar lymph nodes identified on this noncontrast study. The esophagus and visualized thyroid gland are within normal limits. Lungs/Pleura: There are new small bilateral pleural effusions. There is new bilateral lower lobe atelectasis. There may be a small amount of airspace consolidation in the left lower lobe. Moderate emphysematous changes are again seen. There is no pneumothorax. There are minimal secretions in the trachea. Trachea and central airways appear patent. Upper Abdomen: No acute abnormality.  Musculoskeletal: Vertebroplasty changes are seen at L2. No acute fractures identified. There is a healed sternal fracture, unchanged. There is mild body wall edema, new from prior. IMPRESSION: 1. New small bilateral pleural effusions. 2. New bilateral lower lobe atelectasis. Can not exclude small amount of airspace disease/pneumonia in the left lung base. 3. No new enlarged mediastinal or hilar lymph nodes allowing for lack of intravenous contrast. 4. New body wall edema. 5. Aortic Atherosclerosis (ICD10-I70.0) and Emphysema (ICD10-J43.9). Electronically Signed   By: Ronney Asters M.D.   On: 12/27/2020 20:46    Scheduled Meds:  carisoprodol  350 mg Oral TID   diltiazem  90 mg Oral Q6H   docusate sodium  100 mg Oral BID   escitalopram  10 mg Oral Daily   fluconazole  200 mg Oral Daily   gabapentin  600 mg Oral BID   ipratropium  0.5 mg Nebulization TID   levalbuterol  0.63 mg Nebulization TID   linaclotide  145 mcg Oral q1600   loratadine  10 mg Oral Daily   mouth rinse  15 mL Mouth Rinse BID   methylPREDNISolone (SOLU-MEDROL) injection  60 mg Intravenous Q24H   metoprolol tartrate  25 mg Oral BID   pantoprazole  40 mg Oral Daily   polyethylene glycol  34 g Oral q AM   rosuvastatin  5 mg Oral Once per day on Mon Wed Fri   sodium chloride  1 g Oral TID   sucralfate  1 g Oral TID WC & HS   traMADol  100 mg Oral Q8H   valACYclovir  1,000 mg Oral BID   Continuous Infusions:  sodium chloride       LOS: 3 days    Time spent: 35 mins    Elizebath Wever, MD [facility]   If 7PM-7AM, please contact night-coverage

## 2020-12-28 NOTE — Care Management Important Message (Signed)
Important Message  Patient Details IM Letter given to the Patient. Name: Sarai January MRN: [id number] Date of Birth: 1950-04-18   Medicare Important Message Given:  Yes     Kerin Salen 12/28/2020, 10:55 AM

## 2020-12-28 NOTE — Progress Notes (Signed)
Progress Note  Patient Name: Patricia [name] Date of Encounter: 12/28/2020  [facility] Cardiologist: Dr Curt Bears  Subjective   No CP or palpitations; dyspnea improved compared to yesterday  Inpatient Medications    Scheduled Meds:  carisoprodol  350 mg Oral TID   docusate sodium  100 mg Oral BID   escitalopram  10 mg Oral Daily   fluconazole  200 mg Oral Daily   gabapentin  600 mg Oral BID   linaclotide  145 mcg Oral q1600   loratadine  10 mg Oral Daily   mouth rinse  15 mL Mouth Rinse BID   methylPREDNISolone (SOLU-MEDROL) injection  60 mg Intravenous Q24H   pantoprazole  40 mg Oral Daily   polyethylene glycol  34 g Oral q AM   potassium chloride  40 mEq Oral Once   rosuvastatin  5 mg Oral Once per day on Mon Wed Fri   sodium chloride  1 g Oral TID   sucralfate  1 g Oral TID WC & HS   Tbo-filgastrim (GRANIX) SQ  480 mcg Subcutaneous q1800   traMADol  100 mg Oral Q8H   valACYclovir  1,000 mg Oral BID   Continuous Infusions:  sodium chloride     diltiazem (CARDIZEM) infusion 15 mg/hr (12/28/20 0600)   PRN Meds: sodium chloride, acetaminophen **OR** acetaminophen, bisacodyl, clonazePAM, guaiFENesin-dextromethorphan, hydrALAZINE, HYDROcodone-acetaminophen, ipratropium, levalbuterol, lidocaine, morphine injection, ondansetron **OR** ondansetron (ZOFRAN) IV, polyvinyl alcohol, prochlorperazine, zolpidem   Vital Signs    Vitals:   12/28/20 0449 12/28/20 0500 12/28/20 0600 12/28/20 0822  BP: 106/71   129/73  Pulse: 95   77  Resp:  17 17 16   Temp: 98.5 F (36.9 C)     TempSrc: Oral     SpO2: 92%   95%  Weight:      Height:        Intake/Output Summary (Last 24 hours) at 12/28/2020 0957 Last data filed at 12/28/2020 0846 Gross per 24 hour  Intake 1012.35 ml  Output --  Net 1012.35 ml   Last 3 Weights 12/26/2020 12/25/2020 12/23/2020  Weight (lbs) 204 lb 9.4 oz 189 lb 189 lb 4.8 oz  Weight (kg) 92.8 kg 85.73 kg 85.866 kg      Telemetry    Atrial  fibrillation rate mildly elevated.- Personally Reviewed  Physical Exam   GEN: No acute distress.   Neck: No JVD Cardiac: irregular and tachycardic Respiratory: Mildly diminished BS throughout GI: Soft, nontender, non-distended  MS: No edema Neuro:  Nonfocal  Psych: Normal affect   Labs    High Sensitivity Troponin:   Recent Labs  Lab 12/18/20 1100 12/25/20 1221 12/25/20 1713  TROPONINIHS 9 11 11      Chemistry Recent Labs  Lab 12/25/20 1221 12/26/20 0344 12/27/20 0328 12/28/20 0511  NA 132* 133* 133* 132*  K 4.3 4.1 3.5 3.4*  CL 97* 100 100 95*  CO2 26 22 25 28   GLUCOSE 119* 101* 115* 139*  BUN 23 19 12 13   CREATININE 0.95 0.96 0.80 0.75  CALCIUM 8.0* 8.2* 8.0* 8.4*  MG  --   --  1.3*  --   PROT 5.6*  --   --   --   ALBUMIN 3.1*  --   --   --   AST 50*  --   --   --   ALT 54*  --   --   --   ALKPHOS 105  --   --   --   BILITOT  0.5  --   --   --   GFRNONAA >60 >60 >60 >60  ANIONGAP 9 11 8 9      Hematology Recent Labs  Lab 12/26/20 1000 12/27/20 0328 12/28/20 0511  WBC 0.6* 1.0* 3.0*  RBC 2.16* 2.64* 2.68*  HGB 7.4* 8.5* 8.9*  HCT 21.4* 24.4* 25.3*  MCV 99.1 92.4 94.4  MCH 34.3* 32.2 33.2  MCHC 34.6 34.8 35.2  RDW 17.2* 19.0* 18.4*  PLT 16* 25* 29*    Radiology    CT CHEST WO CONTRAST  Result Date: 12/27/2020 CLINICAL DATA:  Respiratory illness. Shortness of breath and tachypnea. Ongoing treatments for lung cancer. EXAM: CT CHEST WITHOUT CONTRAST TECHNIQUE: Multidetector CT imaging of the chest was performed following the standard protocol without IV contrast. COMPARISON:  CT of the chest 12/08/2020. FINDINGS: Cardiovascular: Heart is borderline enlarged, unchanged. Aorta is normal in size. There are atherosclerotic calcifications of the aorta and coronary arteries. There is no pericardial effusion. Mediastinum/Nodes: There are no enlarged mediastinal or hilar lymph nodes identified on this noncontrast study. The esophagus and visualized thyroid  gland are within normal limits. Lungs/Pleura: There are new small bilateral pleural effusions. There is new bilateral lower lobe atelectasis. There may be a small amount of airspace consolidation in the left lower lobe. Moderate emphysematous changes are again seen. There is no pneumothorax. There are minimal secretions in the trachea. Trachea and central airways appear patent. Upper Abdomen: No acute abnormality. Musculoskeletal: Vertebroplasty changes are seen at L2. No acute fractures identified. There is a healed sternal fracture, unchanged. There is mild body wall edema, new from prior. IMPRESSION: 1. New small bilateral pleural effusions. 2. New bilateral lower lobe atelectasis. Can not exclude small amount of airspace disease/pneumonia in the left lung base. 3. No new enlarged mediastinal or hilar lymph nodes allowing for lack of intravenous contrast. 4. New body wall edema. 5. Aortic Atherosclerosis (ICD10-I70.0) and Emphysema (ICD10-J43.9). Electronically Signed   By: Ronney Asters M.D.   On: 12/27/2020 20:46    Cardiac Studies   10/01/20  IMPRESSIONS     1. Left ventricular ejection fraction, by estimation, is 60 to 65%. The  left ventricle has normal function. The left ventricle has no regional  wall motion abnormalities. Left ventricular diastolic parameters are  consistent with Grade I diastolic  dysfunction (impaired relaxation).   2. Right ventricular systolic function is normal. The right ventricular  size is normal.   3. Prominent epicardial fat.   4. The mitral valve is normal in structure. No evidence of mitral valve  regurgitation. No evidence of mitral stenosis.   5. The aortic valve was not well visualized. There is mild calcification  of the aortic valve. Aortic valve regurgitation is not visualized. Mild  aortic valve sclerosis is present, with no evidence of aortic valve  stenosis.   6. The inferior vena cava is normal in size with <50% respiratory  variability,  suggesting right atrial pressure of 8 mmHg.   Patient Profile     70 y.o. female with past medical history of hypertension, hyperlipidemia, previous TIA, metastatic lung cancer being evaluated for atrial fibrillation.  Assessment & Plan    1 persistent atrial fibrillation-patient remains in atrial fibrillation this morning.  Heart rate has improved but remains mildly elevated.  Change Cardizem to 90 mg every 6 hours and then to Cardizem CD 360 mg daily if blood pressure tolerates.  Add metoprolol 12.5 mg twice daily.  She is significantly thrombocytopenic from recent chemotherapy and  therefore anticoagulation has been discontinued.  Can resume later once platelet count improves.  Cannot consider cardioversion since she is not anticoagulated.  Note LV function is normal.  2 small cell lung cancer-status post chemotherapy.  Plan is also for radiation.  Management per oncology.  3 pancytopenia-due to chemotherapy.  Follow blood counts.  Can consider resuming anticoagulation as platelet count improves.  Other issues per primary service.  For questions or updates, please contact [place] Please consult [email or web address] for contact info under        Signed, Kirk Ruths, MD  12/28/2020, 9:57 AM

## 2020-12-29 ENCOUNTER — Ambulatory Visit: Payer: Medicare HMO | Admitting: Physician Assistant

## 2020-12-29 ENCOUNTER — Other Ambulatory Visit: Payer: Medicare HMO

## 2020-12-29 ENCOUNTER — Ambulatory Visit
Admission: RE | Admit: 2020-12-29 | Discharge: 2020-12-29 | Disposition: A | Discharge status: Home / Self Care | Payer: Medicare HMO | Source: Ambulatory Visit | Attending: Radiation Oncology | Admitting: Radiation Oncology

## 2020-12-29 DIAGNOSIS — I480 Paroxysmal atrial fibrillation: Secondary | ICD-10-CM | POA: Diagnosis not present

## 2020-12-29 LAB — CBC WITH DIFFERENTIAL/PLATELET
Abs Immature Granulocytes: 0.09 10*3/uL — ABNORMAL HIGH (ref 0.00–0.07)
Basophils Absolute: 0.1 10*3/uL (ref 0.0–0.1)
Basophils Relative: 1 %
Eosinophils Absolute: 0 10*3/uL (ref 0.0–0.5)
Eosinophils Relative: 0 %
HCT: 27 % — ABNORMAL LOW (ref 36.0–46.0)
Hemoglobin: 9.3 g/dL — ABNORMAL LOW (ref 12.0–15.0)
Immature Granulocytes: 1 %
Lymphocytes Relative: 2 %
Lymphs Abs: 0.1 10*3/uL — ABNORMAL LOW (ref 0.7–4.0)
MCH: 32.5 pg (ref 26.0–34.0)
MCHC: 34.4 g/dL (ref 30.0–36.0)
MCV: 94.4 fL (ref 80.0–100.0)
Monocytes Absolute: 0.8 10*3/uL (ref 0.1–1.0)
Monocytes Relative: 10 %
Neutro Abs: 6.5 10*3/uL (ref 1.7–7.7)
Neutrophils Relative %: 86 %
Platelets: 33 10*3/uL — ABNORMAL LOW (ref 150–400)
RBC: 2.86 MIL/uL — ABNORMAL LOW (ref 3.87–5.11)
RDW: 18.3 % — ABNORMAL HIGH (ref 11.5–15.5)
WBC: 7.5 10*3/uL (ref 4.0–10.5)
nRBC: 1.2 % — ABNORMAL HIGH (ref 0.0–0.2)

## 2020-12-29 LAB — BASIC METABOLIC PANEL
Anion gap: 7 (ref 5–15)
BUN: 23 mg/dL (ref 8–23)
CO2: 30 mmol/L (ref 22–32)
Calcium: 8.6 mg/dL — ABNORMAL LOW (ref 8.9–10.3)
Chloride: 96 mmol/L — ABNORMAL LOW (ref 98–111)
Creatinine, Ser: 0.8 mg/dL (ref 0.44–1.00)
GFR, Estimated: >60 mL/min (ref >60)
Glucose, Bld: 122 mg/dL — ABNORMAL HIGH (ref 70–99)
Potassium: 3.5 mmol/L (ref 3.5–5.1)
Sodium: 133 mmol/L — ABNORMAL LOW (ref 135–145)

## 2020-12-29 LAB — PHOSPHORUS: Phosphorus: 3.5 mg/dL (ref 2.5–4.6)

## 2020-12-29 LAB — MAGNESIUM: Magnesium: 1.6 mg/dL — ABNORMAL LOW (ref 1.7–2.4)

## 2020-12-29 MED ORDER — METOPROLOL TARTRATE 50 MG PO TABS
50.0000 mg | ORAL_TABLET | Freq: Two times a day (BID) | ORAL | Status: DC
  Administered 2020-12-29: 22:00:00 50 mg via ORAL
  Filled 2020-12-29: qty 1

## 2020-12-29 MED ORDER — MAGNESIUM SULFATE 2 GM/50ML IV SOLN
2.0000 g | Freq: Once | INTRAVENOUS | Status: AC
  Administered 2020-12-29: 10:00:00 2 g via INTRAVENOUS
  Filled 2020-12-29: qty 50

## 2020-12-29 MED ORDER — DILTIAZEM HCL ER COATED BEADS 180 MG PO CP24
360.0000 mg | ORAL_CAPSULE | Freq: Every day | ORAL | Status: DC
  Administered 2020-12-29 – 2021-01-03 (×6): 360 mg via ORAL
  Filled 2020-12-29 (×7): qty 2

## 2020-12-29 NOTE — Progress Notes (Signed)
Brief oncology note:  Labs from this morning have been reviewed.  Her WBC and ANC have normalized.  Granix has already been discontinued.  Her hemoglobin continues to slowly improve and is up to 9.3 this morning.  Platelet count slowly recovering and is up to 33,000 this morning.  She is not bleeding.  Anticipate this will slowly recover over the next few days.  No transfusion is indicated today.  Her Eliquis remains on hold due to thrombocytopenia.  Recommend resuming Eliquis when platelet count is 50,000 or higher consistently without transfusion.  CBC    Component Value Date/Time   WBC 7.5 12/29/2020 0445   RBC 2.86 (L) 12/29/2020 0445   HGB 9.3 (L) 12/29/2020 0445   HGB 7.7 (L) 12/20/2020 1341   HCT 27.0 (L) 12/29/2020 0445   PLT 33 (L) 12/29/2020 0445   PLT 167 12/20/2020 1341   MCV 94.4 12/29/2020 0445   MCH 32.5 12/29/2020 0445   MCHC 34.4 12/29/2020 0445   RDW 18.3 (H) 12/29/2020 0445   LYMPHSABS 0.1 (L) 12/29/2020 0445   MONOABS 0.8 12/29/2020 0445   EOSABS 0.0 12/29/2020 0445   BASOSABS 0.1 12/29/2020 0445   From our standpoint, the patient may be discharged when platelet count closer to 50,000-possibly tomorrow.  Her outpatient appointments are in the process of being rescheduled.  We will delay her next cycle of chemotherapy to allow her additional time to recover.  The patient will be contacted with the date and time of her new appointments.  Medical oncology will sign off at this time.  Please call if there are questions.  Future Appointments  Date Time Provider [place]  12/30/2020  2:30 PM [facility] LINAC 1 [facility] None  01/03/2021  2:30 PM [facility] LINAC 1 [facility] None  01/04/2021  2:45 PM [facility] LINAC 1 [facility] None  01/05/2021  2:30 PM [facility] LINAC 1 [facility] None  01/06/2021 10:55 AM [place], PA-C [facility]  01/06/2021  2:45 PM [facility] LINAC 1 [facility] None  01/10/2021  1:15 PM [facility]  LAB [facility] None  01/10/2021  1:45 PM Curt Bears, MD [facility] None  01/10/2021  2:30 PM [facility] LINAC 1 [facility] None  01/11/2021  2:45 PM [facility] LINAC 1 [facility] None  01/12/2021  2:30 PM [facility] LINAC 1 [facility] None  01/13/2021  2:15 PM [facility] LINAC 1 [facility] None     Mikey Bussing, DNP, AGPCNP-BC, AOCNP

## 2020-12-29 NOTE — Progress Notes (Signed)
Progress Note  Patient Name: Patricia [name] Date of Encounter: 12/29/2020  [facility] Cardiologist: Dr Curt Bears  Subjective   Dyspneic; no CP  Inpatient Medications    Scheduled Meds:  carisoprodol  350 mg Oral TID   diltiazem  360 mg Oral Daily   docusate sodium  100 mg Oral BID   escitalopram  10 mg Oral Daily   fluconazole  200 mg Oral Daily   gabapentin  600 mg Oral BID   ipratropium  0.5 mg Nebulization TID   levalbuterol  0.63 mg Nebulization TID   linaclotide  145 mcg Oral q1600   loratadine  10 mg Oral Daily   mouth rinse  15 mL Mouth Rinse BID   methylPREDNISolone (SOLU-MEDROL) injection  60 mg Intravenous Q24H   metoprolol tartrate  50 mg Oral BID   pantoprazole  40 mg Oral Daily   polyethylene glycol  34 g Oral q AM   rosuvastatin  5 mg Oral Once per day on Mon Wed Fri   sodium chloride  1 g Oral TID   sucralfate  1 g Oral TID WC & HS   traMADol  100 mg Oral Q8H   valACYclovir  1,000 mg Oral BID   Continuous Infusions:  sodium chloride     magnesium sulfate bolus IVPB 2 g (12/29/20 0957)   PRN Meds: sodium chloride, acetaminophen **OR** acetaminophen, bisacodyl, clonazePAM, guaiFENesin-dextromethorphan, hydrALAZINE, HYDROcodone-acetaminophen, ipratropium, levalbuterol, lidocaine, morphine injection, ondansetron **OR** ondansetron (ZOFRAN) IV, polyvinyl alcohol, prochlorperazine, zolpidem   Vital Signs    Vitals:   12/28/20 2322 12/29/20 0423 12/29/20 0742 12/29/20 0815  BP: 126/69 118/84 130/85   Pulse: (!) 116 (!) 114 (!) 118   Resp: 17 19 16    Temp: 98.3 F (36.8 C) 98.5 F (36.9 C) 98.5 F (36.9 C)   TempSrc: Oral Oral Oral   SpO2: 92% 90% 91% 91%  Weight:      Height:        Intake/Output Summary (Last 24 hours) at 12/29/2020 1020 Last data filed at 12/28/2020 1730 Gross per 24 hour  Intake 93.55 ml  Output 600 ml  Net -506.45 ml    Last 3 Weights 12/26/2020 12/25/2020 12/23/2020  Weight (lbs) 204 lb 9.4 oz 189 lb 189 lb  4.8 oz  Weight (kg) 92.8 kg 85.73 kg 85.866 kg      Telemetry    Atrial fibrillation rate elevated.- Personally Reviewed  Physical Exam   GEN: No acute distress.  WD Neck: supple Cardiac: irregular and tachycardic Respiratory: Mildly diminished BS throughout; no wheeze GI: Soft, NT/ND MS: No edema Neuro:  Grossly intact Psych: Normal affect   Labs    High Sensitivity Troponin:   Recent Labs  Lab 12/18/20 1100 12/25/20 1221 12/25/20 1713  TROPONINIHS 9 11 11       Chemistry Recent Labs  Lab 12/25/20 1221 12/26/20 0344 12/27/20 0328 12/28/20 0511 12/29/20 0445  NA 132*   < > 133* 132* 133*  K 4.3   < > 3.5 3.4* 3.5  CL 97*   < > 100 95* 96*  CO2 26   < > 25 28 30   GLUCOSE 119*   < > 115* 139* 122*  BUN 23   < > 12 13 23   CREATININE 0.95   < > 0.80 0.75 0.80  CALCIUM 8.0*   < > 8.0* 8.4* 8.6*  MG  --   --  1.3*  --  1.6*  PROT 5.6*  --   --   --   --  ALBUMIN 3.1*  --   --   --   --   AST 50*  --   --   --   --   ALT 54*  --   --   --   --   ALKPHOS 105  --   --   --   --   BILITOT 0.5  --   --   --   --   GFRNONAA >60   < > >60 >60 >60  ANIONGAP 9   < > 8 9 7    < > = values in this interval not displayed.      Hematology Recent Labs  Lab 12/27/20 0328 12/28/20 0511 12/29/20 0445  WBC 1.0* 3.0* 7.5  RBC 2.64* 2.68* 2.86*  HGB 8.5* 8.9* 9.3*  HCT 24.4* 25.3* 27.0*  MCV 92.4 94.4 94.4  MCH 32.2 33.2 32.5  MCHC 34.8 35.2 34.4  RDW 19.0* 18.4* 18.3*  PLT 25* 29* 33*     Radiology    CT CHEST WO CONTRAST  Result Date: 12/27/2020 CLINICAL DATA:  Respiratory illness. Shortness of breath and tachypnea. Ongoing treatments for lung cancer. EXAM: CT CHEST WITHOUT CONTRAST TECHNIQUE: Multidetector CT imaging of the chest was performed following the standard protocol without IV contrast. COMPARISON:  CT of the chest 12/08/2020. FINDINGS: Cardiovascular: Heart is borderline enlarged, unchanged. Aorta is normal in size. There are atherosclerotic  calcifications of the aorta and coronary arteries. There is no pericardial effusion. Mediastinum/Nodes: There are no enlarged mediastinal or hilar lymph nodes identified on this noncontrast study. The esophagus and visualized thyroid gland are within normal limits. Lungs/Pleura: There are new small bilateral pleural effusions. There is new bilateral lower lobe atelectasis. There may be a small amount of airspace consolidation in the left lower lobe. Moderate emphysematous changes are again seen. There is no pneumothorax. There are minimal secretions in the trachea. Trachea and central airways appear patent. Upper Abdomen: No acute abnormality. Musculoskeletal: Vertebroplasty changes are seen at L2. No acute fractures identified. There is a healed sternal fracture, unchanged. There is mild body wall edema, new from prior. IMPRESSION: 1. New small bilateral pleural effusions. 2. New bilateral lower lobe atelectasis. Can not exclude small amount of airspace disease/pneumonia in the left lung base. 3. No new enlarged mediastinal or hilar lymph nodes allowing for lack of intravenous contrast. 4. New body wall edema. 5. Aortic Atherosclerosis (ICD10-I70.0) and Emphysema (ICD10-J43.9). Electronically Signed   By: Ronney Asters M.D.   On: 12/27/2020 20:46    Cardiac Studies   10/01/20  IMPRESSIONS     1. Left ventricular ejection fraction, by estimation, is 60 to 65%. The  left ventricle has normal function. The left ventricle has no regional  wall motion abnormalities. Left ventricular diastolic parameters are  consistent with Grade I diastolic  dysfunction (impaired relaxation).   2. Right ventricular systolic function is normal. The right ventricular  size is normal.   3. Prominent epicardial fat.   4. The mitral valve is normal in structure. No evidence of mitral valve  regurgitation. No evidence of mitral stenosis.   5. The aortic valve was not well visualized. There is mild calcification  of the  aortic valve. Aortic valve regurgitation is not visualized. Mild  aortic valve sclerosis is present, with no evidence of aortic valve  stenosis.   6. The inferior vena cava is normal in size with <50% respiratory  variability, suggesting right atrial pressure of 8 mmHg.   Patient Profile  70 y.o. female with past medical history of hypertension, hyperlipidemia, previous TIA, metastatic lung cancer being evaluated for atrial fibrillation.  Assessment & Plan    1 persistent atrial fibrillation-patient remains in atrial fibrillation and heart rate is elevated.  Change Cardizem to 360 mg CD daily.  Increase metoprolol to 50 mg twice daily and follow.  She is significantly thrombocytopenic from recent chemotherapy and therefore anticoagulation has been discontinued.  Can resume later once platelet count improves.  Cannot consider cardioversion since she is not anticoagulated.  Note LV function is normal.  2 small cell lung cancer-status post chemotherapy.  Radiation per oncology.  3 pancytopenia-due to chemotherapy.  Follow blood counts.  Can consider resuming anticoagulation as platelet count improves.  Other issues per primary service.  For questions or updates, please contact [place] Please consult [email or web address] for contact info under        Signed, Kirk Ruths, MD  12/29/2020, 10:20 AM

## 2020-12-29 NOTE — Progress Notes (Signed)
PROGRESS NOTE    Patricia [name]  QQI:[id number] DOB: 1950/07/12 DOA: 12/25/2020 PCP: [name], MD    Brief Narrative:  This 70 y.o. female with PMH significant of HTN; HLD; A. flutter scheduled for cardioversion next month; lung cancer (10/2020); and low back pain presenting with fatigue, SOB. She was found to be pancytopenic, on labs on admission. CXR does not show any pneumonia. UA is negative for infection. She was found to be in atrial fibrillation with RVR.  Cardiology consulted, medications adjusted and  heart rate is reasonably controlled. She has received 1 PRBC Labs improving.  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 Chest x-ray unremarkable.  Continue bronchodilators. Continue IV Solu-Medrol 60 mg daily. Consider CT chest if she continues to have difficulty breathing.  Atrial fibrillation with RVR: Heart rate not well controlled. She is switched to Cardizem CD 360 daily. Metoprolol was increased to 50 mg twice daily.  Continue to monitor heart rate Last echocardiogram from 9/22 showed Left ventricular ejection fraction, by estimation, is 60 to 65%.  Eliquis on hold due to severe thrombocytopenia from the chemotherapy.     Primary Small cell lung CA left lung: S/p chemoradiation with cisplatin and etoposide . S/p 3 cycle. She presented with pancytopenia requiring 1 unit of PRBC and platelets. Hb improved 8.9, platelet 29K, ANC improved to 500 She remains on Granix injections.  Oncology is  on the board   Essential hypertension: Blood pressure controlled.  Hyperlipidemia: Continue statins  History of esophagitis: Continue Diflucan and Protonix.   Adjustment disorder: Continue Lexapro and Klonopin   Genital HSV: Continue Valtrex.   Chronic pain syndrome: Continue home medications   Diarrhea> Resolved  Elevated liver enzymes: Continue to monitor     Anemia of chronic disease: Baseline hemoglobin around 8  Remains at baseline.  Transfusion threshold less than 7   Hypomagnesemia : Replaced.   DVT prophylaxis:  SCDs Code Status:  DNR Family Communication:  No family at bed side. Disposition Plan:   Status is: Inpatient  Remains inpatient appropriate because: Admitted for acute hypoxic respiratory failure and A. fib with RVR requiring IV Cardizem.  Anticipated discharge home in 1 to 2 days once heart rate controlled.  Consultants:  Cardiology Oncology  Procedures: None  Antimicrobials:   Anti-infectives (From admission, onward)    Start     Dose/Rate Route Frequency Ordered Stop   12/26/20 1000  fluconazole (DIFLUCAN) tablet 200 mg       Note to Pharmacy: Take 400 mg on first day, then 200 mg per day     200 mg Oral Daily 12/25/20 1700 01/01/21 2359   12/25/20 1730  valACYclovir (VALTREX) tablet 1,000 mg        1,000 mg Oral 2 times daily 12/25/20 1724 01/04/21 0959   12/25/20 1715  valACYclovir (VALTREX) tablet 1,000 mg  Status:  Discontinued        1,000 mg Oral 3 times daily 12/25/20 1700 12/25/20 1724   12/25/20 1545  cefTRIAXone (ROCEPHIN) 2 g  in sodium chloride 0.9 % 100 mL IVPB        2 g 200 mL/hr over 30 Minutes Intravenous  Once 12/25/20 1541 12/26/20 0006        Subjective: Patient was seen and examined at bedside.  Overnight events noted.   Patient reports feeling better.  Her heart rate is still not controlled which is causing more palpitation. She denies any chest pain or shortness of breath.  Objective: Vitals:    12/29/20 0815 12/29/20 1300 12/29/20 1302 12/29/20 1401  BP:  114/86  120/77  Pulse:    84  Resp:    20  Temp:    98.2 F (36.8 C)  TempSrc:    Oral  SpO2: 91%  94% 92%  Weight:      Height:        Intake/Output Summary (Last 24 hours) at 12/29/2020 1413 Last data filed at 12/29/2020 1108 Gross per 24 hour  Intake 141 ml  Output 600 ml  Net -459 ml   Filed Weights   12/25/20 1226 12/26/20 2037  Weight: 85.7 kg 92.8 kg    Examination:  General exam: Appears chronically ill looking, not in distress.  Deconditioned Respiratory system: Clear to auscultation bilaterally, no wheezing, no crackles. Cardiovascular system: S1 & S2 heard, Irregular rhythm, no murmur. Gastrointestinal system: Abdomen is soft, non tender, non distended, BS+ Central nervous system: Alert and oriented x 2. No focal neurological deficits. Extremities: No edema, no cyanosis, no clubbing. Skin: No rashes, lesions or ulcers Psychiatry: Judgement and insight appear normal. Mood & affect appropriate.     Data Reviewed: I have personally reviewed following labs and imaging studies  CBC: Recent Labs  Lab 12/25/20 1221 12/26/20 1000 12/27/20 0328 12/28/20 0511 12/29/20 0445  WBC 0.7* 0.6* 1.0* 3.0* 7.5  NEUTROABS 0.4* 0.3* 0.5* 2.5 6.5  HGB 8.0* 7.4* 8.5* 8.9* 9.3*  HCT 23.8* 21.4* 24.4* 25.3* 27.0*  MCV 98.8 99.1 92.4 94.4 94.4  PLT 27* 16* 25* 29* 33*   Basic Metabolic Panel: Recent Labs  Lab 12/25/20 1221 12/26/20 0344 12/27/20 0328 12/28/20 0511 12/29/20 0445  NA 132* 133* 133* 132* 133*  K 4.3 4.1 3.5 3.4* 3.5  CL 97* 100 100 95* 96*  CO2 26 22 25 28 30   GLUCOSE 119* 101* 115* 139* 122*  BUN 23 19 12 13 23   CREATININE 0.95 0.96 0.80 0.75 0.80  CALCIUM 8.0* 8.2* 8.0* 8.4* 8.6*  MG  --   --  1.3*  --  1.6*  PHOS  --   --   --   --  3.5   GFR: Estimated Creatinine Clearance: 74 mL/min (by C-G formula based on SCr of 0.8 mg/dL). Liver Function Tests: Recent Labs  Lab  12/25/20 1221  AST 50*  ALT 54*  ALKPHOS 105  BILITOT 0.5  PROT 5.6*  ALBUMIN 3.1*   No results for input(s): LIPASE, AMYLASE in the last 168 hours. No results for input(s): AMMONIA in the last 168 hours. Coagulation Profile: Recent Labs  Lab 12/25/20 1221  INR 1.3*   Cardiac Enzymes: No results for input(s): CKTOTAL, CKMB, CKMBINDEX, TROPONINI in the last 168 hours. BNP (last 3 results) No results for input(s): PROBNP in the last 8760 hours. HbA1C: No results for input(s): HGBA1C in the last 72 hours. CBG: Recent Labs  Lab 12/25/20 1248  GLUCAP 111*   Lipid Profile: No results for input(s): CHOL, HDL, LDLCALC, TRIG, CHOLHDL, LDLDIRECT in the last 72 hours. Thyroid Function Tests:  No results for input(s): TSH, T4TOTAL, FREET4, T3FREE, THYROIDAB in the last 72 hours. Anemia Panel: No results for input(s): VITAMINB12, FOLATE, FERRITIN, TIBC, IRON, RETICCTPCT in the last 72 hours. Sepsis Labs: Recent Labs  Lab 12/25/20 1331 12/25/20 1713  LATICACIDVEN 1.1 1.8    Recent Results (from the past 240 hour(s))  Blood culture (routine x 2)     Status: None (Preliminary result)   Collection Time: 12/25/20 11:57 AM   Specimen: BLOOD  Result Value Ref Range Status   Specimen Description BLOOD LEFT ANTECUBITAL  Final   Special Requests   Final    BOTTLES DRAWN AEROBIC AND ANAEROBIC Blood Culture results may not be optimal due to an inadequate volume of blood received in culture bottles   Culture   Final    NO GROWTH 4 DAYS Performed at [facility] [address] [place], [place] [address]    Report Status PENDING  Incomplete  Resp Panel by RT-PCR (Flu A&B, Covid) Nasopharyngeal Swab     Status: None   Collection Time: 12/25/20  8:34 PM   Specimen: Nasopharyngeal Swab; Nasopharyngeal(NP) swabs in vial transport medium  Result Value Ref Range Status   SARS Coronavirus 2 by RT PCR NEGATIVE NEGATIVE Final    Comment: (NOTE) SARS-CoV-2 target nucleic acids are NOT  DETECTED.  The SARS-CoV-2 RNA is generally detectable in upper respiratory specimens during the acute phase of infection. The lowest concentration of SARS-CoV-2 viral copies this assay can detect is 138 copies/mL. A negative result does not preclude SARS-Cov-2 infection and should not be used as the sole basis for treatment or other patient management decisions. A negative result may occur with  improper specimen collection/handling, submission of specimen other than nasopharyngeal swab, presence of viral mutation(s) within the areas targeted by this assay, and inadequate number of viral copies(<138 copies/mL). A negative result must be combined with clinical observations, patient history, and epidemiological information. The expected result is Negative.  Fact Sheet for Patients:  EntrepreneurPulse.com.au  Fact Sheet for Healthcare Providers:  IncredibleEmployment.be  This test is no t yet approved or cleared by the [place] FDA and  has been authorized for detection and/or diagnosis of SARS-CoV-2 by FDA under an Emergency Use Authorization (EUA). This EUA will remain  in effect (meaning this test can be used) for the duration of the COVID-19 declaration under Section 564(b)(1) of the Act, 21 U.S.C.section 360bbb-3(b)(1), unless the authorization is terminated  or revoked sooner.       Influenza A by PCR NEGATIVE NEGATIVE Final   Influenza B by PCR NEGATIVE NEGATIVE Final    Comment: (NOTE) The Xpert Xpress SARS-CoV-2/FLU/RSV plus assay is intended as an aid in the diagnosis of influenza from Nasopharyngeal swab specimens and should not be used as a sole basis for treatment. Nasal washings and aspirates are unacceptable for Xpert Xpress SARS-CoV-2/FLU/RSV testing.  Fact Sheet for Patients: EntrepreneurPulse.com.au  Fact Sheet for Healthcare Providers: IncredibleEmployment.be  This test is not yet  approved or cleared by the [place] FDA and has been authorized for detection and/or diagnosis of SARS-CoV-2 by FDA under an Emergency Use Authorization (EUA). This EUA will remain in effect (meaning this test can be used) for the duration of the COVID-19 declaration under Section 564(b)(1) of the Act, 21 U.S.C. section 360bbb-3(b)(1), unless the authorization is terminated or revoked.  Performed at [facility], [place] [address] [place], [place] [address]   Blood culture (routine x 2)     Status:  None (Preliminary result)   Collection Time: 12/27/20  3:26 PM   Specimen: BLOOD  Result Value Ref Range Status   Specimen Description   Final    BLOOD BLOOD RIGHT HAND Performed at [facility] [address], [place], [place] [address]    Special Requests   Final    BOTTLES DRAWN AEROBIC ONLY Blood Culture adequate volume Performed at [facility] [address] [place], [place] [address]    Culture   Final    NO GROWTH 2 DAYS Performed at [facility] [address], [place], [place] [address]    Report Status PENDING  Incomplete         Radiology Studies: CT CHEST WO CONTRAST  Result Date: 12/27/2020 CLINICAL DATA:  Respiratory illness. Shortness of breath and tachypnea. Ongoing treatments for lung cancer. EXAM: CT CHEST WITHOUT CONTRAST TECHNIQUE: Multidetector CT imaging of the chest was performed following the standard protocol without IV contrast. COMPARISON:  CT of the chest 12/08/2020. FINDINGS: Cardiovascular: Heart is borderline enlarged, unchanged. Aorta is normal in size. There are atherosclerotic calcifications of the aorta and coronary arteries. There is no pericardial effusion. Mediastinum/Nodes: There are no enlarged mediastinal or hilar lymph nodes identified on this noncontrast study. The esophagus and visualized thyroid gland are within normal limits. Lungs/Pleura: There are new small bilateral pleural  effusions. There is new bilateral lower lobe atelectasis. There may be a small amount of airspace consolidation in the left lower lobe. Moderate emphysematous changes are again seen. There is no pneumothorax. There are minimal secretions in the trachea. Trachea and central airways appear patent. Upper Abdomen: No acute abnormality. Musculoskeletal: Vertebroplasty changes are seen at L2. No acute fractures identified. There is a healed sternal fracture, unchanged. There is mild body wall edema, new from prior. IMPRESSION: 1. New small bilateral pleural effusions. 2. New bilateral lower lobe atelectasis. Can not exclude small amount of airspace disease/pneumonia in the left lung base. 3. No new enlarged mediastinal or hilar lymph nodes allowing for lack of intravenous contrast. 4. New body wall edema. 5. Aortic Atherosclerosis (ICD10-I70.0) and Emphysema (ICD10-J43.9). Electronically Signed   By: Ronney Asters M.D.   On: 12/27/2020 20:46    Scheduled Meds:  carisoprodol  350 mg Oral TID   diltiazem  360 mg Oral Daily   docusate sodium  100 mg Oral BID   escitalopram  10 mg Oral Daily   fluconazole  200 mg Oral Daily   gabapentin  600 mg Oral BID   ipratropium  0.5 mg Nebulization TID   levalbuterol  0.63 mg Nebulization TID   linaclotide  145 mcg Oral q1600   loratadine  10 mg Oral Daily   mouth rinse  15 mL Mouth Rinse BID   methylPREDNISolone (SOLU-MEDROL) injection  60 mg Intravenous Q24H   metoprolol tartrate  50 mg Oral BID   pantoprazole  40 mg Oral Daily   polyethylene glycol  34 g Oral q AM   rosuvastatin  5 mg Oral Once per day on Mon Wed Fri   sodium chloride  1 g Oral TID   sucralfate  1 g Oral TID WC & HS   traMADol  100 mg Oral Q8H   valACYclovir  1,000 mg Oral BID   Continuous Infusions:  sodium chloride       LOS: 4 days    Time spent: 25 mins    Shawna Clamp, MD [facility]   If 7PM-7AM, please contact night-coverage

## 2020-12-30 ENCOUNTER — Ambulatory Visit: Payer: Medicare HMO | Admitting: Physician Assistant

## 2020-12-30 ENCOUNTER — Other Ambulatory Visit: Payer: Medicare HMO

## 2020-12-30 ENCOUNTER — Ambulatory Visit
Admission: RE | Admit: 2020-12-30 | Discharge: 2020-12-30 | Disposition: A | Discharge status: Home / Self Care | Payer: Medicare HMO | Source: Ambulatory Visit | Attending: Radiation Oncology | Admitting: Radiation Oncology

## 2020-12-30 DIAGNOSIS — I480 Paroxysmal atrial fibrillation: Secondary | ICD-10-CM | POA: Diagnosis not present

## 2020-12-30 LAB — BASIC METABOLIC PANEL
Anion gap: 8 (ref 5–15)
BUN: 28 mg/dL — ABNORMAL HIGH (ref 8–23)
CO2: 29 mmol/L (ref 22–32)
Calcium: 8.4 mg/dL — ABNORMAL LOW (ref 8.9–10.3)
Chloride: 96 mmol/L — ABNORMAL LOW (ref 98–111)
Creatinine, Ser: 0.77 mg/dL (ref 0.44–1.00)
GFR, Estimated: >60 mL/min (ref >60)
Glucose, Bld: 111 mg/dL — ABNORMAL HIGH (ref 70–99)
Potassium: 3.6 mmol/L (ref 3.5–5.1)
Sodium: 133 mmol/L — ABNORMAL LOW (ref 135–145)

## 2020-12-30 LAB — CULTURE, BLOOD (ROUTINE X 2): Culture: NO GROWTH

## 2020-12-30 LAB — CBC
HCT: 27.5 % — ABNORMAL LOW (ref 36.0–46.0)
Hemoglobin: 9.5 g/dL — ABNORMAL LOW (ref 12.0–15.0)
MCH: 32.4 pg (ref 26.0–34.0)
MCHC: 34.5 g/dL (ref 30.0–36.0)
MCV: 93.9 fL (ref 80.0–100.0)
Platelets: 38 10*3/uL — ABNORMAL LOW (ref 150–400)
RBC: 2.93 MIL/uL — ABNORMAL LOW (ref 3.87–5.11)
RDW: 18.1 % — ABNORMAL HIGH (ref 11.5–15.5)
WBC: 9.7 10*3/uL (ref 4.0–10.5)
nRBC: 1.1 % — ABNORMAL HIGH (ref 0.0–0.2)

## 2020-12-30 LAB — MAGNESIUM: Magnesium: 1.7 mg/dL (ref 1.7–2.4)

## 2020-12-30 LAB — PHOSPHORUS: Phosphorus: 3.6 mg/dL (ref 2.5–4.6)

## 2020-12-30 MED ORDER — METOPROLOL TARTRATE 50 MG PO TABS
75.0000 mg | ORAL_TABLET | Freq: Two times a day (BID) | ORAL | Status: DC
  Administered 2020-12-30 – 2020-12-31 (×3): 75 mg via ORAL
  Filled 2020-12-30 (×3): qty 1

## 2020-12-30 MED ORDER — PANTOPRAZOLE SODIUM 40 MG PO TBEC: 40.0000 mg | DELAYED_RELEASE_TABLET | Freq: Every day | ORAL | Status: DC

## 2020-12-30 MED ORDER — MAGNESIUM SULFATE 2 GM/50ML IV SOLN
2.0000 g | Freq: Once | INTRAVENOUS | Status: AC
  Administered 2020-12-30: 12:00:00 2 g via INTRAVENOUS
  Filled 2020-12-30: qty 50

## 2020-12-30 MED ORDER — METHYLPREDNISOLONE SODIUM SUCC 40 MG IJ SOLR
40.0000 mg | Freq: Every day | INTRAMUSCULAR | Status: DC
Start: 2020-12-31 — End: 2020-12-31
  Administered 2020-12-31: 09:00:00 40 mg via INTRAVENOUS
  Filled 2020-12-30: qty 1

## 2020-12-30 MED ORDER — POTASSIUM CHLORIDE 20 MEQ PO PACK
40.0000 meq | PACK | Freq: Once | ORAL | Status: AC
  Administered 2020-12-30: 12:00:00 40 meq via ORAL
  Filled 2020-12-30: qty 2

## 2020-12-30 NOTE — Plan of Care (Signed)
  Problem: Education: Goal: Knowledge of General Education information will improve Description: Including pain rating scale, medication(s)/side effects and non-pharmacologic comfort measures Outcome: Progressing   Problem: Clinical Measurements: Goal: Will remain free from infection Outcome: Progressing   Problem: Clinical Measurements: Goal: Diagnostic test results will improve Outcome: Progressing   

## 2020-12-30 NOTE — Progress Notes (Signed)
Progress Note  Patient Name: Patricia [name] Date of Encounter: 12/30/2020  [facility] Cardiologist: Dr Curt Bears  Subjective   No CP; mild dyspnea  Inpatient Medications    Scheduled Meds:  carisoprodol  350 mg Oral TID   diltiazem  360 mg Oral Daily   docusate sodium  100 mg Oral BID   escitalopram  10 mg Oral Daily   fluconazole  200 mg Oral Daily   gabapentin  600 mg Oral BID   ipratropium  0.5 mg Nebulization TID   levalbuterol  0.63 mg Nebulization TID   linaclotide  145 mcg Oral q1600   loratadine  10 mg Oral Daily   mouth rinse  15 mL Mouth Rinse BID   methylPREDNISolone (SOLU-MEDROL) injection  60 mg Intravenous Q24H   metoprolol tartrate  50 mg Oral BID   pantoprazole  40 mg Oral Daily   polyethylene glycol  34 g Oral q AM   rosuvastatin  5 mg Oral Once per day on Mon Wed Fri   sodium chloride  1 g Oral TID   sucralfate  1 g Oral TID WC & HS   traMADol  100 mg Oral Q8H   valACYclovir  1,000 mg Oral BID   Continuous Infusions:  sodium chloride     PRN Meds: sodium chloride, acetaminophen **OR** acetaminophen, bisacodyl, clonazePAM, guaiFENesin-dextromethorphan, hydrALAZINE, HYDROcodone-acetaminophen, ipratropium, levalbuterol, lidocaine, morphine injection, ondansetron **OR** ondansetron (ZOFRAN) IV, polyvinyl alcohol, prochlorperazine, zolpidem   Vital Signs    Vitals:   12/29/20 1302 12/29/20 1401 12/29/20 2013 12/30/20 0621  BP:  120/77 130/64 (!) 142/96  Pulse:  84 (!) 107 (!) 124  Resp:  20 16 18   Temp:  98.2 F (36.8 C) 98.1 F (36.7 C) 97.9 F (36.6 C)  TempSrc:  Oral Oral Oral  SpO2: 94% 92% 96% 91%  Weight:      Height:        Intake/Output Summary (Last 24 hours) at 12/30/2020 0904 Last data filed at 12/29/2020 1800 Gross per 24 hour  Intake 47.45 ml  Output 300 ml  Net -252.55 ml    Last 3 Weights 12/26/2020 12/25/2020 12/23/2020  Weight (lbs) 204 lb 9.4 oz 189 lb 189 lb 4.8 oz  Weight (kg) 92.8 kg 85.73 kg 85.866 kg       Telemetry    Atrial fibrillation rate elevated.- Personally Reviewed  Physical Exam   GEN: chronically ill appearing; NAD Neck: supple; no JVD Cardiac: irregular and tachycardic Respiratory: diminished BS GI: Soft, NT/ND, no masses MS: No edema Neuro:  No focal findings Psych: Normal affect   Labs    High Sensitivity Troponin:   Recent Labs  Lab 12/18/20 1100 12/25/20 1221 12/25/20 1713  TROPONINIHS 9 11 11       Chemistry Recent Labs  Lab 12/25/20 1221 12/26/20 0344 12/27/20 0328 12/28/20 0511 12/29/20 0445 12/30/20 0511  NA 132*   < > 133* 132* 133* 133*  K 4.3   < > 3.5 3.4* 3.5 3.6  CL 97*   < > 100 95* 96* 96*  CO2 26   < > 25 28 30 29   GLUCOSE 119*   < > 115* 139* 122* 111*  BUN 23   < > 12 13 23  28*  CREATININE 0.95   < > 0.80 0.75 0.80 0.77  CALCIUM 8.0*   < > 8.0* 8.4* 8.6* 8.4*  MG  --   --  1.3*  --  1.6* 1.7  PROT 5.6*  --   --   --   --   --  ALBUMIN 3.1*  --   --   --   --   --   AST 50*  --   --   --   --   --   ALT 54*  --   --   --   --   --   ALKPHOS 105  --   --   --   --   --   BILITOT 0.5  --   --   --   --   --   GFRNONAA >60   < > >60 >60 >60 >60  ANIONGAP 9   < > 8 9 7 8    < > = values in this interval not displayed.      Hematology Recent Labs  Lab 12/28/20 0511 12/29/20 0445 12/30/20 0511  WBC 3.0* 7.5 9.7  RBC 2.68* 2.86* 2.93*  HGB 8.9* 9.3* 9.5*  HCT 25.3* 27.0* 27.5*  MCV 94.4 94.4 93.9  MCH 33.2 32.5 32.4  MCHC 35.2 34.4 34.5  RDW 18.4* 18.3* 18.1*  PLT 29* 33* 38*     Cardiac Studies   10/01/20  IMPRESSIONS     1. Left ventricular ejection fraction, by estimation, is 60 to 65%. The  left ventricle has normal function. The left ventricle has no regional  wall motion abnormalities. Left ventricular diastolic parameters are  consistent with Grade I diastolic  dysfunction (impaired relaxation).   2. Right ventricular systolic function is normal. The right ventricular  size is normal.   3. Prominent  epicardial fat.   4. The mitral valve is normal in structure. No evidence of mitral valve  regurgitation. No evidence of mitral stenosis.   5. The aortic valve was not well visualized. There is mild calcification  of the aortic valve. Aortic valve regurgitation is not visualized. Mild  aortic valve sclerosis is present, with no evidence of aortic valve  stenosis.   6. The inferior vena cava is normal in size with <50% respiratory  variability, suggesting right atrial pressure of 8 mmHg.   Patient Profile     70 y.o. female with past medical history of hypertension, hyperlipidemia, previous TIA, metastatic lung cancer being evaluated for atrial fibrillation.  Assessment & Plan    1 persistent atrial fibrillation-patient remains in atrial fibrillation.  Heart rate is not controlled at this point.  Continue Cardizem at present dose.  Increase metoprolol to 75 mg twice daily.  Follow heart rate and advance medications as needed.  Platelet count remains 38,000.  Anticoagulation is on hold.  We will resume as platelet count improves and can consider cardioversion in the future once she is able to take apixaban.  LV function is normal.    2 small cell lung cancer-status post chemotherapy.  Radiation per oncology.  3 pancytopenia-due to chemotherapy.  Follow blood counts.  Can consider resuming anticoagulation as platelet count improves.  Other issues per primary service.  For questions or updates, please contact [place] Please consult [email or web address] for contact info under        Signed, Kirk Ruths, MD  12/30/2020, 9:04 AM

## 2020-12-30 NOTE — Progress Notes (Addendum)
PROGRESS NOTE    Patricia [name]  ENI:[id number] DOB: 15-May-1950 DOA: 12/25/2020 PCP: [name], MD    Brief Narrative:  This 70 y.o. female with PMH significant of HTN; HLD; A. flutter scheduled for cardioversion next month; lung cancer (10/2020); and low back pain presenting with fatigue, SOB. She was found to be pancytopenic, on labs on admission. CXR does not show any pneumonia. UA is negative for infection. She was found to be in atrial fibrillation with RVR.  Cardiology consulted, medications adjusted and  heart rate is reasonably controlled. She has received 1 PRBC Labs improving.  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 Chest x-ray unremarkable.  Continue bronchodilators. Continue IV Solu-Medrol 60 mg daily, start tapering off, reduced to 40 mg daily. Consider CT chest if she continues to have difficulty breathing.  Atrial fibrillation with RVR: Heart rate not well controlled. She is switched to Cardizem CD 360 daily. Metoprolol is increased to 75 mg twice daily.  Continue to monitor heart rate Last echocardiogram from 9/22 showed Left ventricular ejection fraction, by estimation, is 60 to 65%.  Eliquis on hold due to severe thrombocytopenia from the chemotherapy.     Primary Small cell lung CA left lung: S/p chemoradiation with cisplatin and etoposide . S/p 3 cycle. She presented with pancytopenia requiring 1 unit of PRBC and platelets. Hb improved 8.9, platelet 33K, ANC improved to 500 She  remains on Granix injections.  Oncology is on the board.   Essential hypertension: Blood pressure controlled.  Hyperlipidemia: Continue statins.  History of esophagitis: Continue Diflucan and Protonix.   Adjustment disorder: Continue Lexapro and Klonopin.   Genital HSV: Continue Valtrex.   Chronic pain syndrome: Continue home medications .  Diarrhea> Resolved  Elevated liver enzymes: Continue to monitor     Anemia of chronic disease: Baseline hemoglobin around 8  Remains at baseline.  Transfusion threshold less than 7   Hypomagnesemia : Replaced.   DVT prophylaxis:  SCDs Code Status:  DNR Family Communication:  No family at bed side. Disposition Plan:   Status is: Inpatient  Remains inpatient appropriate because: Admitted for acute hypoxic respiratory failure and A. fib with RVR requiring IV Cardizem.  Anticipated discharge home in 1 to 2 days once heart rate controlled.  Consultants:  Cardiology Oncology  Procedures: None  Antimicrobials:   Anti-infectives (From admission, onward)    Start     Dose/Rate Route Frequency Ordered Stop   12/26/20 1000  fluconazole (DIFLUCAN) tablet 200 mg       Note to Pharmacy: Take 400 mg on first day, then 200 mg per day     200 mg Oral Daily 12/25/20 1700 01/01/21 2359   12/25/20 1730  valACYclovir (VALTREX) tablet 1,000 mg        1,000 mg Oral 2 times daily 12/25/20 1724 01/04/21 0959   12/25/20 1715  valACYclovir (VALTREX) tablet 1,000 mg  Status:  Discontinued        1,000 mg Oral 3 times daily 12/25/20 1700 12/25/20 1724  12/25/20 1545  cefTRIAXone (ROCEPHIN) 2 g in sodium chloride 0.9 % 100 mL IVPB        2 g 200 mL/hr over 30 Minutes Intravenous  Once 12/25/20 1541 12/26/20 0006        Subjective: Patient was seen and examined at bedside.  Overnight events noted.   Her heart rate is still not controlled , causing palpitations. She denies any chest pain or shortness of breath.  Objective: Vitals:    12/29/20 1401 12/29/20 2013 12/30/20 0621 12/30/20 1220  BP: 120/77 130/64 (!) 142/96 125/84  Pulse: 84 (!) 107 (!) 124 (!) 109  Resp: 20 16 18 20   Temp: 98.2 F (36.8 C) 98.1 F (36.7 C) 97.9 F (36.6 C) 97.7 F (36.5 C)  TempSrc: Oral Oral Oral Oral  SpO2: 92% 96% 91% 98%  Weight:      Height:        Intake/Output Summary (Last 24 hours) at 12/30/2020 1350 Last data filed at 12/29/2020 1800 Gross per 24 hour  Intake --  Output 300 ml  Net -300 ml   Filed Weights   12/25/20 1226 12/26/20 2037  Weight: 85.7 kg 92.8 kg    Examination:  General exam: Appears chronically ill looking, deconditioned, not in any distress. Respiratory system: Clear to auscultation bilaterally, no wheezing, no crackles. Cardiovascular system: S1 & S2 heard, Irregular rhythm, no murmur. Gastrointestinal system: Abdomen is soft, non tender, non distended, BS+ Central nervous system: Alert and oriented x 3. No focal neurological deficits. Extremities: No edema, no cyanosis, no clubbing. Skin: No rashes, lesions or ulcers Psychiatry: Judgement and insight appear normal. Mood & affect appropriate.     Data Reviewed: I have personally reviewed following labs and imaging studies  CBC: Recent Labs  Lab 12/25/20 1221 12/26/20 1000 12/27/20 0328 12/28/20 0511 12/29/20 0445 12/30/20 0511  WBC 0.7* 0.6* 1.0* 3.0* 7.5 9.7  NEUTROABS 0.4* 0.3* 0.5* 2.5 6.5  --   HGB 8.0* 7.4* 8.5* 8.9* 9.3* 9.5*  HCT 23.8* 21.4* 24.4* 25.3* 27.0* 27.5*  MCV 98.8 99.1 92.4 94.4 94.4 93.9  PLT 27* 16* 25* 29* 33* 38*   Basic Metabolic Panel: Recent Labs  Lab 12/26/20 0344 12/27/20 0328 12/28/20 0511 12/29/20 0445 12/30/20 0511  NA 133* 133* 132* 133* 133*  K 4.1 3.5 3.4* 3.5 3.6  CL 100 100 95* 96* 96*  CO2 22 25 28 30 29   GLUCOSE 101* 115* 139* 122* 111*  BUN 19 12 13 23  28*  CREATININE 0.96 0.80 0.75 0.80 0.77  CALCIUM 8.2* 8.0* 8.4* 8.6* 8.4*  MG  --  1.3*  --  1.6* 1.7  PHOS  --   --   --  3.5  3.6   GFR: Estimated Creatinine Clearance: 74 mL/min (by C-G formula based on SCr of 0.77 mg/dL). Liver Function Tests: Recent Labs  Lab 12/25/20 1221  AST 50*  ALT 54*  ALKPHOS 105  BILITOT 0.5  PROT 5.6*  ALBUMIN 3.1*   No results for input(s): LIPASE, AMYLASE in the last 168 hours. No results for input(s): AMMONIA in the last 168 hours. Coagulation Profile: Recent Labs  Lab 12/25/20 1221  INR 1.3*   Cardiac Enzymes: No results for input(s): CKTOTAL, CKMB, CKMBINDEX, TROPONINI in the last 168 hours. BNP (last 3 results) No results for input(s): PROBNP in the last 8760 hours. HbA1C: No results for input(s): HGBA1C in the last 72 hours. CBG: Recent Labs  Lab 12/25/20 1248  GLUCAP 111*   Lipid Profile:  No results for input(s): CHOL, HDL, LDLCALC, TRIG, CHOLHDL, LDLDIRECT in the last 72 hours. Thyroid Function Tests: No results for input(s): TSH, T4TOTAL, FREET4, T3FREE, THYROIDAB in the last 72 hours. Anemia Panel: No results for input(s): VITAMINB12, FOLATE, FERRITIN, TIBC, IRON, RETICCTPCT in the last 72 hours. Sepsis Labs: Recent Labs  Lab 12/25/20 1331 12/25/20 1713  LATICACIDVEN 1.1 1.8    Recent Results (from the past 240 hour(s))  Blood culture (routine x 2)     Status: None   Collection Time: 12/25/20 11:57 AM   Specimen: BLOOD  Result Value Ref Range Status   Specimen Description BLOOD LEFT ANTECUBITAL  Final   Special Requests   Final    BOTTLES DRAWN AEROBIC AND ANAEROBIC Blood Culture results may not be optimal due to an inadequate volume of blood received in culture bottles   Culture   Final    NO GROWTH 5 DAYS Performed at [facility], [place] [address] [place], [place] [address]    Report Status 12/30/2020 FINAL  Final  Resp Panel by RT-PCR (Flu A&B, Covid) Nasopharyngeal Swab     Status: None   Collection Time: 12/25/20  8:34 PM   Specimen: Nasopharyngeal Swab; Nasopharyngeal(NP) swabs in vial transport medium  Result Value Ref  Range Status   SARS Coronavirus 2 by RT PCR NEGATIVE NEGATIVE Final    Comment: (NOTE) SARS-CoV-2 target nucleic acids are NOT DETECTED.  The SARS-CoV-2 RNA is generally detectable in upper respiratory specimens during the acute phase of infection. The lowest concentration of SARS-CoV-2 viral copies this assay can detect is 138 copies/mL. A negative result does not preclude SARS-Cov-2 infection and should not be used as the sole basis for treatment or other patient management decisions. A negative result may occur with  improper specimen collection/handling, submission of specimen other than nasopharyngeal swab, presence of viral mutation(s) within the areas targeted by this assay, and inadequate number of viral copies(<138 copies/mL). A negative result must be combined with clinical observations, patient history, and epidemiological information. The expected result is Negative.  Fact Sheet for Patients:  EntrepreneurPulse.com.au  Fact Sheet for Healthcare Providers:  IncredibleEmployment.be  This test is no t yet approved or cleared by the [place] FDA and  has been authorized for detection and/or diagnosis of SARS-CoV-2 by FDA under an Emergency Use Authorization (EUA). This EUA will remain  in effect (meaning this test can be used) for the duration of the COVID-19 declaration under Section 564(b)(1) of the Act, 21 U.S.C.section 360bbb-3(b)(1), unless the authorization is terminated  or revoked sooner.       Influenza A by PCR NEGATIVE NEGATIVE Final   Influenza B by PCR NEGATIVE NEGATIVE Final    Comment: (NOTE) The Xpert Xpress SARS-CoV-2/FLU/RSV plus assay is intended as an aid in the diagnosis of influenza from Nasopharyngeal swab specimens and should not be used as a sole basis for treatment. Nasal washings and aspirates are unacceptable for Xpert Xpress SARS-CoV-2/FLU/RSV testing.  Fact Sheet for  Patients: EntrepreneurPulse.com.au  Fact Sheet for Healthcare Providers: IncredibleEmployment.be  This test is not yet approved or cleared by the [place] FDA and has been authorized for detection and/or diagnosis of SARS-CoV-2 by FDA under an Emergency Use Authorization (EUA). This EUA will remain in effect (meaning this test can be used) for the duration of the COVID-19 declaration under Section 564(b)(1) of the Act, 21 U.S.C. section 360bbb-3(b)(1), unless the authorization is terminated or revoked.  Performed at [facility], [place]  [address], [place], [place] [address]   Blood culture (routine x 2)     Status: None (Preliminary result)   Collection Time: 12/27/20  3:26 PM   Specimen: BLOOD  Result Value Ref Range Status   Specimen Description   Final    BLOOD BLOOD RIGHT HAND Performed at [place] [address], [place], [place] [address]    Special Requests   Final    BOTTLES DRAWN AEROBIC ONLY Blood Culture adequate volume Performed at [place] [address] [place], [place] [address]    Culture   Final    NO GROWTH 3 DAYS Performed at [facility], [place] [address], [place], [place] [address]    Report Status PENDING  Incomplete         Radiology Studies: No results found.  Scheduled Meds:  carisoprodol  350 mg Oral TID   diltiazem  360 mg Oral Daily   docusate sodium  100 mg Oral BID   escitalopram  10 mg Oral Daily   fluconazole  200 mg Oral Daily   gabapentin  600 mg Oral BID   ipratropium  0.5 mg Nebulization TID   levalbuterol  0.63 mg Nebulization TID   linaclotide  145 mcg Oral q1600   loratadine  10 mg Oral Daily   mouth rinse  15 mL Mouth Rinse BID   [START ON 12/31/2020] methylPREDNISolone (SOLU-MEDROL) injection  40 mg Intravenous Daily   metoprolol tartrate  75 mg Oral BID   pantoprazole  40 mg Oral Daily   polyethylene glycol  34 g Oral q AM    rosuvastatin  5 mg Oral Once per day on Mon Wed Fri   sodium chloride  1 g Oral TID   sucralfate  1 g Oral TID WC & HS   traMADol  100 mg Oral Q8H   valACYclovir  1,000 mg Oral BID   Continuous Infusions:  sodium chloride       LOS: 5 days    Time spent: 25 mins    Shawna Clamp, MD [facility]   If 7PM-7AM, please contact night-coverage

## 2020-12-30 NOTE — TOC Initial Note (Signed)
Transition of Care [facility]) - Initial/Assessment Note    Patient Details  Name: Patricia [name] MRN: [id number] Date of Birth: [birth year]  [facility]) CM/SW Contact:    Leeroy Cha, RN Phone Number: 12/30/2020, 9:16 AM  Clinical Narrative:                  Transition of Care [facility]) Screening Note   Patient Details  Name: Patricia [name] Date of Birth: 01-14-1950   Transition of Care [facility]) CM/SW Contact:    Leeroy Cha, RN Phone Number: 12/30/2020, 9:16 AM    Transition of Care Department [facility]) has reviewed patient and no TOC needs have been identified at this time. We will continue to monitor patient advancement through interdisciplinary progression rounds. If new patient transition needs arise, please place a TOC consult.     Expected Discharge Plan: Home/Self Care Barriers to Discharge: Continued Medical Work up   Patient Goals and CMS Choice Patient states their goals for this hospitalization and ongoing recovery are:: to go home CMS Medicare.gov Compare Post Acute Care list provided to:: Patient Choice offered to / list presented to : Patient  Expected Discharge Plan and Services Expected Discharge Plan: Home/Self Care   Discharge Planning Services: CM Consult   Living arrangements for the past 2 months: Single Family Home                                      Prior Living Arrangements/Services Living arrangements for the past 2 months: Single Family Home Lives with:: Spouse Patient language and need for interpreter reviewed:: Yes Do you feel safe going back to the place where you live?: Yes            Criminal Activity/Legal Involvement Pertinent to Current Situation/Hospitalization: No - Comment as needed  Activities of Daily Living Home Assistive Devices/Equipment: Walker (specify type) ADL Screening (condition at time of admission) Patient's cognitive ability adequate to safely complete daily activities?:  Yes Is the patient deaf or have difficulty hearing?: No Does the patient have difficulty seeing, even when wearing glasses/contacts?: No Does the patient have difficulty concentrating, remembering, or making decisions?: No Patient able to express need for assistance with ADLs?: No Does the patient have difficulty dressing or bathing?: No Independently performs ADLs?: Yes (appropriate for developmental age) Does the patient have difficulty walking or climbing stairs?: Yes Weakness of Legs: [name] Weakness of Arms/Hands: [name]  Permission Sought/Granted                  Emotional Assessment Appearance:: Appears stated age Attitude/Demeanor/Rapport: Engaged Affect (typically observed): Calm Orientation: : Oriented to Place, Oriented to Self, Oriented to  Time, Oriented to Situation Alcohol / Substance Use: Not Applicable Psych Involvement: No (comment)  Admission diagnosis:  Chemotherapy-induced neutropenia ([place]) [D17.6, H60.7P7T] Complicated UTI (urinary tract infection) [N39.0] Pancytopenia ([place]) [G62.694] Urinary tract infection with hematuria, site unspecified [N39.0, R31.9] Patient Active Problem List   Diagnosis Date Noted   Complicated UTI (urinary tract infection) 12/25/2020   Dyspnea 12/25/2020   Dyslipidemia 12/25/2020   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   Antineoplastic chemotherapy induced pancytopenia ([place]) 12/25/2020   Esophagitis 12/25/2020   Encounter for antineoplastic chemotherapy 12/12/2020   Chemotherapy induced neutropenia ([place]) 11/14/2020   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 ([place]) 10/21/2020   Lung nodule 10/18/2020   Anxiety    AF (paroxysmal atrial  fibrillation) ([place])    Paroxysmal SVT (supraventricular tachycardia) (HCC)    Chronic pain    Hyponatremia 10/01/2020   Essential hypertension 10/01/2020   Syncope 09/30/2020   Asthmatic bronchitis 08/11/2020   Colles' fracture of right radius, initial encounter for closed fracture 11/19/2019    PCP:  [name], MD Pharmacy:   [facility] [place], [place] [address] Phone: [phone] Fax: [phone]     Social Determinants of Health (SDOH) Interventions    Readmission Risk Interventions Readmission Risk Prevention Plan 10/17/2020  Post Dischage Appt Complete  Medication Screening Complete  Transportation Screening Complete  Some recent data might be hidden

## 2020-12-31 DIAGNOSIS — I4891 Unspecified atrial fibrillation: Secondary | ICD-10-CM | POA: Diagnosis not present

## 2020-12-31 MED ORDER — METOPROLOL TARTRATE 50 MG PO TABS
100.0000 mg | ORAL_TABLET | Freq: Two times a day (BID) | ORAL | Status: DC
  Administered 2020-12-31 – 2021-01-01 (×2): 100 mg via ORAL
  Filled 2020-12-31 (×2): qty 2

## 2020-12-31 MED ORDER — METOPROLOL TARTRATE 25 MG PO TABS
25.0000 mg | ORAL_TABLET | ORAL | Status: AC
  Administered 2020-12-31: 11:00:00 25 mg via ORAL
  Filled 2020-12-31: qty 1

## 2020-12-31 MED ORDER — METHYLPREDNISOLONE SODIUM SUCC 40 MG IJ SOLR
20.0000 mg | Freq: Every day | INTRAMUSCULAR | Status: DC
  Administered 2021-01-01: 11:00:00 20 mg via INTRAVENOUS
  Filled 2020-12-31: qty 1

## 2020-12-31 NOTE — Progress Notes (Signed)
PROGRESS NOTE    Patricia [name]  KKX:[id number] DOB: 28-Oct-1950 DOA: 12/25/2020 PCP: [name], MD    Brief Narrative:  This 70 y.o. female with PMH significant of HTN; HLD; A. flutter scheduled for cardioversion next month; lung cancer (10/2020); and low back pain presenting with fatigue, SOB. She was found to be pancytopenic, on labs on admission. CXR does not show any pneumonia. UA is negative for infection. She was found to be in atrial fibrillation with RVR.  Cardiology consulted, medications adjusted and  heart rate is reasonably controlled. She has received 1 PRBC Labs improving.  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 Chest x-ray unremarkable.  Continue bronchodilators. Continue IV Solu-Medrol 60 mg daily, start tapering off, reduced to 40 > 20 mg daily. Consider CT chest if she continues to have difficulty breathing.  Atrial fibrillation with RVR: Heart rate not well controlled. She is switched to Cardizem CD 360 daily. Metoprolol is increased to 100 mg twice daily.  Continue to monitor heart rate Last echocardiogram from 9/22 showed Left ventricular ejection fraction, by estimation, is 60 to 65%.  Eliquis on hold due to severe thrombocytopenia from the chemotherapy.     Primary Small cell lung CA left lung: S/p chemoradiation with cisplatin and etoposide . S/p 3 cycle. She presented with pancytopenia requiring 1 unit of PRBC and platelets. Hb improved 9.5, platelet 38K, ANC improved to  500 She remains on Granix injections.  Oncology is on the board.   Essential hypertension: Blood pressure controlled.  Hyperlipidemia: Continue statin.  History of esophagitis: Continue Diflucan and Protonix.   Adjustment disorder: Continue Lexapro and Klonopin.   Genital HSV: Continue Valtrex.   Chronic pain syndrome: Continue home medications .  Diarrhea> Resolved  Elevated liver enzymes: Continue to monitor     Anemia of chronic disease: Baseline hemoglobin around 8  Remains at baseline.  Transfusion threshold less than 7   Hypomagnesemia : Replaced.   DVT prophylaxis:  SCDs Code Status:  DNR Family Communication:  No family at bed side. Disposition Plan:   Status is: Inpatient  Remains inpatient appropriate because: Admitted for acute hypoxic respiratory failure and A. fib with RVR requiring IV Cardizem.  Anticipated discharge home in 1 to 2 days once heart rate controlled.  Consultants:  Cardiology Oncology  Procedures: None  Antimicrobials:   Anti-infectives (From admission, onward)    Start     Dose/Rate Route Frequency Ordered Stop   12/26/20 1000  fluconazole (DIFLUCAN) tablet 200 mg       Note to Pharmacy: Take 400 mg on first day, then 200 mg per day     200 mg Oral Daily 12/25/20 1700 01/01/21 2359   12/25/20 1730  valACYclovir (VALTREX) tablet 1,000 mg        1,000 mg Oral 2 times daily 12/25/20 1724 01/04/21 0959   12/25/20 1715  valACYclovir (VALTREX) tablet 1,000 mg  Status:  Discontinued        1,000 mg Oral 3 times daily 12/25/20 1700 12/25/20  1724   12/25/20 1545  cefTRIAXone (ROCEPHIN) 2 g in sodium chloride 0.9 % 100 mL IVPB        2 g 200 mL/hr over 30 Minutes Intravenous  Once 12/25/20 1541 12/26/20 0006        Subjective: Patient was seen and examined at bedside.  Overnight events noted.   Her heart rate is still not controlled , causing palpitations. She denies any chest pain, shortness of breath. Asks when she would be  discharged.  Objective: Vitals:   12/31/20 0817 12/31/20 0818 12/31/20 1258 12/31/20 1325  BP:   121/78   Pulse:  (!) 133 (!) 108   Resp:  20 19   Temp:   (!) 97.5 F (36.4 C)   TempSrc:   Oral   SpO2: 96% 94% 98% 94%  Weight:      Height:        Intake/Output Summary (Last 24 hours) at 12/31/2020 1350 Last data filed at 12/31/2020 1002 Gross per 24 hour  Intake 360 ml  Output --  Net 360 ml   Filed Weights   12/25/20 1226 12/26/20 2037  Weight: 85.7 kg 92.8 kg    Examination:  General exam: Appears comfortable, not in any acute distress, chronically ill looking, deconditioned. Respiratory system: Clear to auscultation bilaterally, no wheezing, no crackles. Cardiovascular system: S1 & S2 heard, Irregular rhythm, no murmur. Gastrointestinal system: Abdomen is soft, non tender, non distended, BS+ Central nervous system: Alert and oriented x 3. No focal neurological deficits. Extremities: No edema, no cyanosis, no clubbing. Skin: No rashes, lesions or ulcers Psychiatry: Judgement and insight appear normal. Mood & affect appropriate.     Data Reviewed: I have personally reviewed following labs and imaging studies  CBC: Recent Labs  Lab 12/25/20 1221 12/26/20 1000 12/27/20 0328 12/28/20 0511 12/29/20 0445 12/30/20 0511  WBC 0.7* 0.6* 1.0* 3.0* 7.5 9.7  NEUTROABS 0.4* 0.3* 0.5* 2.5 6.5  --   HGB 8.0* 7.4* 8.5* 8.9* 9.3* 9.5*  HCT 23.8* 21.4* 24.4* 25.3* 27.0* 27.5*  MCV 98.8 99.1 92.4 94.4 94.4 93.9  PLT 27* 16* 25* 29* 33* 38*   Basic Metabolic Panel: Recent Labs  Lab 12/26/20 0344 12/27/20 0328 12/28/20 0511 12/29/20 0445 12/30/20 0511  NA 133* 133* 132* 133* 133*  K 4.1 3.5 3.4* 3.5 3.6  CL 100 100 95* 96* 96*  CO2 22 25 28 30 29   GLUCOSE 101* 115* 139* 122* 111*  BUN 19 12 13 23  28*  CREATININE 0.96 0.80 0.75 0.80 0.77  CALCIUM 8.2* 8.0* 8.4* 8.6* 8.4*  MG  --  1.3*  --  1.6* 1.7  PHOS  --   --   --  3.5 3.6   GFR: Estimated Creatinine  Clearance: 74 mL/min (by C-G formula based on SCr of 0.77 mg/dL). Liver Function Tests: Recent Labs  Lab 12/25/20 1221  AST 50*  ALT 54*  ALKPHOS 105  BILITOT 0.5  PROT 5.6*  ALBUMIN 3.1*   No results for input(s): LIPASE, AMYLASE in the last 168 hours. No results for input(s): AMMONIA in the last 168 hours. Coagulation Profile: Recent Labs  Lab 12/25/20 1221  INR 1.3*   Cardiac Enzymes: No results for input(s): CKTOTAL, CKMB, CKMBINDEX, TROPONINI in the last 168 hours. BNP (last 3 results) No results for input(s): PROBNP in the last 8760 hours. HbA1C: No results for input(s): HGBA1C in the last 72 hours. CBG: Recent Labs  Lab 12/25/20 1248  GLUCAP 111*   Lipid Profile:  No results for input(s): CHOL, HDL, LDLCALC, TRIG, CHOLHDL, LDLDIRECT in the last 72 hours. Thyroid Function Tests: No results for input(s): TSH, T4TOTAL, FREET4, T3FREE, THYROIDAB in the last 72 hours. Anemia Panel: No results for input(s): VITAMINB12, FOLATE, FERRITIN, TIBC, IRON, RETICCTPCT in the last 72 hours. Sepsis Labs: Recent Labs  Lab 12/25/20 1331 12/25/20 1713  LATICACIDVEN 1.1 1.8    Recent Results (from the past 240 hour(s))  Blood culture (routine x 2)     Status: None   Collection Time: 12/25/20 11:57 AM   Specimen: BLOOD  Result Value Ref Range Status   Specimen Description BLOOD LEFT ANTECUBITAL  Final   Special Requests   Final    BOTTLES DRAWN AEROBIC AND ANAEROBIC Blood Culture results may not be optimal due to an inadequate volume of blood received in culture bottles   Culture   Final    NO GROWTH 5 DAYS Performed at [facility], [place] [address] [place], [place] [address]    Report Status 12/30/2020 FINAL  Final  Resp Panel by RT-PCR (Flu A&B, Covid) Nasopharyngeal Swab     Status: None   Collection Time: 12/25/20  8:34 PM   Specimen: Nasopharyngeal Swab; Nasopharyngeal(NP) swabs in vial transport medium  Result Value Ref Range Status   SARS Coronavirus 2 by  RT PCR NEGATIVE NEGATIVE Final    Comment: (NOTE) SARS-CoV-2 target nucleic acids are NOT DETECTED.  The SARS-CoV-2 RNA is generally detectable in upper respiratory specimens during the acute phase of infection. The lowest concentration of SARS-CoV-2 viral copies this assay can detect is 138 copies/mL. A negative result does not preclude SARS-Cov-2 infection and should not be used as the sole basis for treatment or other patient management decisions. A negative result may occur with  improper specimen collection/handling, submission of specimen other than nasopharyngeal swab, presence of viral mutation(s) within the areas targeted by this assay, and inadequate number of viral copies(<138 copies/mL). A negative result must be combined with clinical observations, patient history, and epidemiological information. The expected result is Negative.  Fact Sheet for Patients:  EntrepreneurPulse.com.au  Fact Sheet for Healthcare Providers:  IncredibleEmployment.be  This test is no t yet approved or cleared by the [place] FDA and  has been authorized for detection and/or diagnosis of SARS-CoV-2 by FDA under an Emergency Use Authorization (EUA). This EUA will remain  in effect (meaning this test can be used) for the duration of the COVID-19 declaration under Section 564(b)(1) of the Act, 21 U.S.C.section 360bbb-3(b)(1), unless the authorization is terminated  or revoked sooner.       Influenza A by PCR NEGATIVE NEGATIVE Final   Influenza B by PCR NEGATIVE NEGATIVE Final    Comment: (NOTE) The Xpert Xpress SARS-CoV-2/FLU/RSV plus assay is intended as an aid in the diagnosis of influenza from Nasopharyngeal swab specimens and should not be used as a sole basis for treatment. Nasal washings and aspirates are unacceptable for Xpert Xpress SARS-CoV-2/FLU/RSV testing.  Fact Sheet for Patients: EntrepreneurPulse.com.au  Fact Sheet  for Healthcare Providers: IncredibleEmployment.be  This test is not yet approved or cleared by the [place] FDA and has been authorized for detection and/or diagnosis of SARS-CoV-2 by FDA under an Emergency Use Authorization (EUA). This EUA will remain in effect (meaning this test can be used) for the duration of the COVID-19 declaration under Section 564(b)(1) of the Act, 21 U.S.C. section 360bbb-3(b)(1), unless the authorization is terminated or revoked.  Performed at [facility], [place]  [address], [place], [place] [address]   Blood culture (routine x 2)     Status: None (Preliminary result)   Collection Time: 12/27/20  3:26 PM   Specimen: BLOOD  Result Value Ref Range Status   Specimen Description   Final    BLOOD BLOOD RIGHT HAND Performed at [place] [address] [place], [place] [address]    Special Requests   Final    BOTTLES DRAWN AEROBIC ONLY Blood Culture adequate volume Performed at [place] [facility] [address] [place], [place] [address]    Culture   Final    NO GROWTH 4 DAYS Performed at [facility], [place] [address], [place], [place] [address]    Report Status PENDING  Incomplete         Radiology Studies: No results found.  Scheduled Meds:  carisoprodol  350 mg Oral TID   diltiazem  360 mg Oral Daily   docusate sodium  100 mg Oral BID   escitalopram  10 mg Oral Daily   fluconazole  200 mg Oral Daily   gabapentin  600 mg Oral BID   ipratropium  0.5 mg Nebulization TID   levalbuterol  0.63 mg Nebulization TID   linaclotide  145 mcg Oral q1600   loratadine  10 mg Oral Daily   mouth rinse  15 mL Mouth Rinse BID   methylPREDNISolone (SOLU-MEDROL) injection  40 mg Intravenous Daily   metoprolol tartrate  100 mg Oral BID   pantoprazole  40 mg Oral Daily   polyethylene glycol  34 g Oral q AM   rosuvastatin  5 mg Oral Once per day on Mon Wed Fri   sodium chloride  1 g Oral TID    sucralfate  1 g Oral TID WC & HS   traMADol  100 mg Oral Q8H   valACYclovir  1,000 mg Oral BID   Continuous Infusions:  sodium chloride       LOS: 6 days    Time spent: 25 mins    Shawna Clamp, MD [facility]   If 7PM-7AM, please contact night-coverage

## 2020-12-31 NOTE — Progress Notes (Signed)
Progress Note  Patient Name: Patricia [name] Date of Encounter: 12/31/2020  Primary Cardiologist: Dr. Reggy Eye  Subjective   No chest pain or shortness of breath.  Inpatient Medications    Scheduled Meds:  carisoprodol  350 mg Oral TID   diltiazem  360 mg Oral Daily   docusate sodium  100 mg Oral BID   escitalopram  10 mg Oral Daily   fluconazole  200 mg Oral Daily   gabapentin  600 mg Oral BID   ipratropium  0.5 mg Nebulization TID   levalbuterol  0.63 mg Nebulization TID   linaclotide  145 mcg Oral q1600   loratadine  10 mg Oral Daily   mouth rinse  15 mL Mouth Rinse BID   methylPREDNISolone (SOLU-MEDROL) injection  40 mg Intravenous Daily   metoprolol tartrate  75 mg Oral BID   pantoprazole  40 mg Oral Daily   polyethylene glycol  34 g Oral q AM   rosuvastatin  5 mg Oral Once per day on Mon Wed Fri   sodium chloride  1 g Oral TID   sucralfate  1 g Oral TID WC & HS   traMADol  100 mg Oral Q8H   valACYclovir  1,000 mg Oral BID   Continuous Infusions:  sodium chloride     PRN Meds: sodium chloride, acetaminophen **OR** acetaminophen, bisacodyl, clonazePAM, guaiFENesin-dextromethorphan, hydrALAZINE, HYDROcodone-acetaminophen, ipratropium, levalbuterol, lidocaine, morphine injection, ondansetron **OR** ondansetron (ZOFRAN) IV, polyvinyl alcohol, prochlorperazine, zolpidem   Vital Signs    Vitals:   12/30/20 2139 12/31/20 0451 12/31/20 0817 12/31/20 0818  BP: (!) 133/95 (!) 123/93    Pulse: 75 (!) 105  (!) 133  Resp: 19 18  20   Temp: 98.2 F (36.8 C) 98.2 F (36.8 C)    TempSrc: Oral Oral    SpO2: 93% 94% 96% 94%  Weight:      Height:        Intake/Output Summary (Last 24 hours) at 12/31/2020 0936 Last data filed at 12/30/2020 2338 Gross per 24 hour  Intake 120 ml  Output --  Net 120 ml   Filed Weights   12/25/20 1226 12/26/20 2037  Weight: 85.7 kg 92.8 kg    Telemetry    Atrial fibrillation with RVR.  Personally reviewed.  ECG     No ECG reviewed.  Physical Exam   GEN: No acute distress.   Neck: No JVD. Cardiac: Irregularly irregular without gallop.  Respiratory: Nonlabored. Clear to auscultation bilaterally. GI: Soft, nontender, bowel sounds present. MS: No edema; No deformity. Neuro:  Nonfocal. Psych: Alert and oriented x 3. Normal affect.  Labs    Chemistry Recent Labs  Lab 12/25/20 1221 12/26/20 0344 12/28/20 0511 12/29/20 0445 12/30/20 0511  NA 132*   < > 132* 133* 133*  K 4.3   < > 3.4* 3.5 3.6  CL 97*   < > 95* 96* 96*  CO2 26   < > 28 30 29   GLUCOSE 119*   < > 139* 122* 111*  BUN 23   < > 13 23 28*  CREATININE 0.95   < > 0.75 0.80 0.77  CALCIUM 8.0*   < > 8.4* 8.6* 8.4*  PROT 5.6*  --   --   --   --   ALBUMIN 3.1*  --   --   --   --   AST 50*  --   --   --   --   ALT 54*  --   --   --   --  ALKPHOS 105  --   --   --   --   BILITOT 0.5  --   --   --   --   GFRNONAA >60   < > >60 >60 >60  ANIONGAP 9   < > 9 7 8    < > = values in this interval not displayed.     Hematology Recent Labs  Lab 12/28/20 0511 12/29/20 0445 12/30/20 0511  WBC 3.0* 7.5 9.7  RBC 2.68* 2.86* 2.93*  HGB 8.9* 9.3* 9.5*  HCT 25.3* 27.0* 27.5*  MCV 94.4 94.4 93.9  MCH 33.2 32.5 32.4  MCHC 35.2 34.4 34.5  RDW 18.4* 18.3* 18.1*  PLT 29* 33* 38*    Cardiac Enzymes Recent Labs  Lab 12/18/20 1100 12/25/20 1221 12/25/20 1713  TROPONINIHS 9 11 11    Assessment & Plan    1.  Persistent atrial fibrillation with recent RVR in the setting of acute illness.  Currently on Cardizem CD 360 mg daily and Lopressor which was uptitrated to 75 mg twice daily yesterday.  CHA2DS2-VASc score is 4.  She is currently not anticoagulated in light of acute thrombocytopenia following chemotherapy.  2.  Small cell lung cancer status postchemotherapy and XRT.  Platelets 38 this morning, would continue to hold on anticoagulation.  Supplement morning Lopressor dose with an additional 25 mg and increase to 100 mg twice daily,  otherwise continue Cardizem CD 360 mg daily.  Signed, Rozann Lesches, MD  12/31/2020, 9:36 AM

## 2021-01-01 DIAGNOSIS — I4819 Other persistent atrial fibrillation: Secondary | ICD-10-CM | POA: Diagnosis not present

## 2021-01-01 LAB — CULTURE, BLOOD (ROUTINE X 2)
Culture: NO GROWTH
Special Requests: ADEQUATE

## 2021-01-01 LAB — BASIC METABOLIC PANEL
Anion gap: 6 (ref 5–15)
BUN: 26 mg/dL — ABNORMAL HIGH (ref 8–23)
CO2: 32 mmol/L (ref 22–32)
Calcium: 8.3 mg/dL — ABNORMAL LOW (ref 8.9–10.3)
Chloride: 95 mmol/L — ABNORMAL LOW (ref 98–111)
Creatinine, Ser: 0.75 mg/dL (ref 0.44–1.00)
GFR, Estimated: >60 mL/min (ref >60)
Glucose, Bld: 84 mg/dL (ref 70–99)
Potassium: 3.7 mmol/L (ref 3.5–5.1)
Sodium: 133 mmol/L — ABNORMAL LOW (ref 135–145)

## 2021-01-01 LAB — MAGNESIUM: Magnesium: 1.5 mg/dL — ABNORMAL LOW (ref 1.7–2.4)

## 2021-01-01 LAB — CBC
HCT: 30.2 % — ABNORMAL LOW (ref 36.0–46.0)
Hemoglobin: 10.3 g/dL — ABNORMAL LOW (ref 12.0–15.0)
MCH: 32.5 pg (ref 26.0–34.0)
MCHC: 34.1 g/dL (ref 30.0–36.0)
MCV: 95.3 fL (ref 80.0–100.0)
Platelets: 52 10*3/uL — ABNORMAL LOW (ref 150–400)
RBC: 3.17 MIL/uL — ABNORMAL LOW (ref 3.87–5.11)
RDW: 18.2 % — ABNORMAL HIGH (ref 11.5–15.5)
WBC: 7.8 10*3/uL (ref 4.0–10.5)
nRBC: 1.2 % — ABNORMAL HIGH (ref 0.0–0.2)

## 2021-01-01 LAB — PHOSPHORUS: Phosphorus: 4.4 mg/dL (ref 2.5–4.6)

## 2021-01-01 MED ORDER — MAGNESIUM SULFATE 2 GM/50ML IV SOLN
2.0000 g | Freq: Once | INTRAVENOUS | Status: AC
  Administered 2021-01-01: 11:00:00 2 g via INTRAVENOUS
  Filled 2021-01-01: qty 50

## 2021-01-01 MED ORDER — METOPROLOL TARTRATE 50 MG PO TABS
75.0000 mg | ORAL_TABLET | Freq: Three times a day (TID) | ORAL | Status: DC
  Administered 2021-01-01 – 2021-01-03 (×6): 75 mg via ORAL
  Filled 2021-01-01 (×6): qty 1

## 2021-01-01 NOTE — Progress Notes (Signed)
Patient refused the scheduled 2200 tramadol medication. Patient states "it keeps me awake at night".

## 2021-01-01 NOTE — Plan of Care (Signed)

## 2021-01-01 NOTE — Progress Notes (Signed)
PROGRESS NOTE    Patricia [name]  AOZ:[id number] DOB: September 10, 1950 DOA: 12/25/2020 PCP: [name], MD    Brief Narrative:  This 70 y.o. female with PMH significant of HTN; HLD; A. flutter scheduled for cardioversion next month; lung cancer (10/2020); and low back pain presenting with fatigue, SOB. She was found to be pancytopenic, on labs on admission. CXR does not show any pneumonia. UA is negative for infection. She was found to be in atrial fibrillation with RVR.  Cardiology consulted, medications adjusted and  heart rate is reasonably controlled. She has received 1 PRBC Labs improving.  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 Chest x-ray unremarkable.  Continue bronchodilators. Continue IV Solu-Medrol 60 mg daily, start tapering off, reduced to 40 > 20> DC. Consider CT chest if she continues to have difficulty breathing.  Atrial fibrillation with RVR: Heart rate not well controlled. She is switched to Cardizem CD 360 daily. Metoprolol is increased to 100 mg twice daily.   Continue to monitor heart rate. Metoprolol changed to 75 mg q 8hrs. Last echocardiogram from 9/22 showed Left ventricular ejection fraction, by estimation, is 60 to 65%.  Eliquis on hold due to severe thrombocytopenia from the chemotherapy.  Resume Eliquis when platelet count remains above 50 K    Primary Small cell lung CA left lung: S/p chemoradiation with cisplatin and etoposide . S/p 3 cycle. She presented with pancytopenia  requiring 1 unit of PRBC and platelets. Hb improved 9.5, platelet 52K, ANC improved to 500 She remains on Granix injections.  Oncology is on the board.   Essential hypertension: Blood pressure controlled. Continue Cardizem and metoprolol.  Hyperlipidemia: Continue statin.  History of esophagitis: Continue Diflucan and Protonix.   Adjustment disorder: Continue Lexapro and Klonopin.   Genital HSV: Continue Valtrex.   Chronic pain syndrome: Continue home medications .  Diarrhea> Resolved  Elevated liver enzymes: Continue to monitor     Anemia of chronic disease: Baseline hemoglobin around 8  Remains at baseline.  Transfusion threshold less than 7   Hypomagnesemia : Replaced.   DVT prophylaxis:  SCDs Code Status:  DNR Family Communication:  No family at bed side. Disposition Plan:   Status is: Inpatient  Remains inpatient appropriate because:  Admitted for acute hypoxic respiratory failure and A. fib with RVR requiring IV Cardizem.  Anticipated discharge home in 1 to 2 days once heart rate controlled.  Consultants:  Cardiology Oncology  Procedures: None  Antimicrobials:   Anti-infectives (From admission, onward)    Start     Dose/Rate Route Frequency Ordered Stop   12/26/20 1000  fluconazole (DIFLUCAN) tablet 200 mg       Note to Pharmacy: Take 400 mg on first day, then 200 mg per day     200 mg Oral Daily 12/25/20 1700 01/01/21 2359   12/25/20 1730  valACYclovir (VALTREX) tablet 1,000 mg        1,000 mg Oral 2 times daily 12/25/20 1724 01/04/21 0959   12/25/20 1715  valACYclovir (VALTREX) tablet 1,000  mg  Status:  Discontinued        1,000 mg Oral 3 times daily 12/25/20 1700 12/25/20 1724   12/25/20 1545  cefTRIAXone (ROCEPHIN) 2 g in sodium chloride 0.9 % 100 mL IVPB        2 g 200 mL/hr over 30 Minutes Intravenous  Once 12/25/20 1541 12/26/20 0006        Subjective: Patient was seen and examined at bedside.  Overnight events noted.   She  denies any chest pain, shortness of breath.  Her heart rate still remains elevated above 130.  Medications adjusted. Platelet count is above 52, feels better but concerned about elevated heart rate.  Objective: Vitals:   01/01/21 0511 01/01/21 0833 01/01/21 1040 01/01/21 1243  BP: 115/89  126/84 113/78  Pulse: (!) 110   84  Resp: 16   16  Temp: 97.8 F (36.6 C)   (!) 97.4 F (36.3 C)  TempSrc: Oral   Oral  SpO2: 96% 97%  96%  Weight:      Height:        Intake/Output Summary (Last 24 hours) at 01/01/2021 1255 Last data filed at 01/01/2021 0859 Gross per 24 hour  Intake 1320 ml  Output 1200 ml  Net 120 ml   Filed Weights   12/25/20 1226 12/26/20 2037  Weight: 85.7 kg 92.8 kg    Examination:  General exam: Appears comfortable, not in any acute distress, deconditioned. Respiratory system: Clear to auscultation bilaterally, no wheezing, no crackles. Cardiovascular system: S1 & S2 heard, Irregular rhythm, no murmur. Gastrointestinal system: Abdomen is soft, non tender, non distended, BS+ Central nervous system: Alert and oriented x 3. No focal neurological deficits. Extremities: No edema, no cyanosis, no clubbing. Skin: No rashes, lesions or ulcers Psychiatry: Judgement and insight appear normal. Mood & affect appropriate.     Data Reviewed: I have personally reviewed following labs and imaging studies  CBC: Recent Labs  Lab 12/26/20 1000 12/27/20 0328 12/28/20 0511 12/29/20 0445 12/30/20 0511 01/01/21 0529  WBC 0.6* 1.0* 3.0* 7.5 9.7 7.8  NEUTROABS 0.3* 0.5* 2.5 6.5  --   --   HGB 7.4* 8.5* 8.9* 9.3* 9.5* 10.3*  HCT 21.4* 24.4* 25.3* 27.0* 27.5* 30.2*  MCV 99.1 92.4 94.4 94.4 93.9 95.3  PLT 16* 25* 29* 33* 38* 52*   Basic Metabolic Panel: Recent Labs  Lab 12/27/20 0328 12/28/20 0511 12/29/20 0445 12/30/20 0511 01/01/21 0529  NA 133* 132* 133* 133* 133*  K 3.5 3.4* 3.5 3.6 3.7  CL 100 95* 96* 96* 95*  CO2 25 28 30 29  32  GLUCOSE 115* 139* 122* 111*  84  BUN 12 13 23  28* 26*  CREATININE 0.80 0.75 0.80 0.77 0.75  CALCIUM 8.0* 8.4* 8.6* 8.4* 8.3*  MG 1.3*  --  1.6* 1.7 1.5*  PHOS  --   --  3.5 3.6 4.4   GFR: Estimated Creatinine Clearance: 74 mL/min (by C-G formula based on SCr of 0.75 mg/dL). Liver Function Tests: No results for input(s): AST, ALT, ALKPHOS, BILITOT, PROT, ALBUMIN in the last 168 hours.  No results for input(s): LIPASE, AMYLASE in the last 168 hours. No results for input(s): AMMONIA in the last 168 hours. Coagulation Profile: No results for input(s): INR, PROTIME in the last 168 hours.  Cardiac Enzymes: No results for input(s): CKTOTAL, CKMB, CKMBINDEX, TROPONINI in the last 168 hours. BNP (last 3 results) No results for input(s): PROBNP in the last 8760 hours. HbA1C: No results for input(s): HGBA1C  in the last 72 hours. CBG: No results for input(s): GLUCAP in the last 168 hours.  Lipid Profile: No results for input(s): CHOL, HDL, LDLCALC, TRIG, CHOLHDL, LDLDIRECT in the last 72 hours. Thyroid Function Tests: No results for input(s): TSH, T4TOTAL, FREET4, T3FREE, THYROIDAB in the last 72 hours. Anemia Panel: No results for input(s): VITAMINB12, FOLATE, FERRITIN, TIBC, IRON, RETICCTPCT in the last 72 hours. Sepsis Labs: Recent Labs  Lab 12/25/20 1331 12/25/20 1713  LATICACIDVEN 1.1 1.8    Recent Results (from the past 240 hour(s))  Blood culture (routine x 2)     Status: None   Collection Time: 12/25/20 11:57 AM   Specimen: BLOOD  Result Value Ref Range Status   Specimen Description BLOOD LEFT ANTECUBITAL  Final   Special Requests   Final    BOTTLES DRAWN AEROBIC AND ANAEROBIC Blood Culture results may not be optimal due to an inadequate volume of blood received in culture bottles   Culture   Final    NO GROWTH 5 DAYS Performed at [facility], [place] [address] [place], [place] [address]    Report Status 12/30/2020 FINAL  Final  Resp Panel by RT-PCR (Flu A&B, Covid) Nasopharyngeal Swab      Status: None   Collection Time: 12/25/20  8:34 PM   Specimen: Nasopharyngeal Swab; Nasopharyngeal(NP) swabs in vial transport medium  Result Value Ref Range Status   SARS Coronavirus 2 by RT PCR NEGATIVE NEGATIVE Final    Comment: (NOTE) SARS-CoV-2 target nucleic acids are NOT DETECTED.  The SARS-CoV-2 RNA is generally detectable in upper respiratory specimens during the acute phase of infection. The lowest concentration of SARS-CoV-2 viral copies this assay can detect is 138 copies/mL. A negative result does not preclude SARS-Cov-2 infection and should not be used as the sole basis for treatment or other patient management decisions. A negative result may occur with  improper specimen collection/handling, submission of specimen other than nasopharyngeal swab, presence of viral mutation(s) within the areas targeted by this assay, and inadequate number of viral copies(<138 copies/mL). A negative result must be combined with clinical observations, patient history, and epidemiological information. The expected result is Negative.  Fact Sheet for Patients:  EntrepreneurPulse.com.au  Fact Sheet for Healthcare Providers:  IncredibleEmployment.be  This test is no t yet approved or cleared by the [place] FDA and  has been authorized for detection and/or diagnosis of SARS-CoV-2 by FDA under an Emergency Use Authorization (EUA). This EUA will remain  in effect (meaning this test can be used) for the duration of the COVID-19 declaration under Section 564(b)(1) of the Act, 21 U.S.C.section 360bbb-3(b)(1), unless the authorization is terminated  or revoked sooner.       Influenza A by PCR NEGATIVE NEGATIVE Final   Influenza B by PCR NEGATIVE NEGATIVE Final    Comment: (NOTE) The Xpert Xpress SARS-CoV-2/FLU/RSV plus assay is intended as an aid in the diagnosis of influenza from Nasopharyngeal swab specimens and should not be used as a sole basis  for treatment. Nasal washings and aspirates are unacceptable for Xpert Xpress SARS-CoV-2/FLU/RSV testing.  Fact Sheet for Patients: EntrepreneurPulse.com.au  Fact Sheet for Healthcare Providers: IncredibleEmployment.be  This test is not yet approved or cleared by the [place] FDA and has been authorized for detection and/or diagnosis of SARS-CoV-2 by FDA under an Emergency Use Authorization (EUA). This EUA will remain in effect (meaning this test can be used) for the duration of the COVID-19 declaration under Section 564(b)(1) of the Act, 21  U.S.C. section 360bbb-3(b)(1), unless the authorization is terminated or revoked.  Performed at [facility], [place] [address] [place], [place] [address]   Blood culture (routine x 2)     Status: None (Preliminary result)   Collection Time: 12/27/20  3:26 PM   Specimen: BLOOD  Result Value Ref Range Status   Specimen Description   Final    BLOOD BLOOD RIGHT HAND Performed at [place] [address], [place], [place] [address]    Special Requests   Final    BOTTLES DRAWN AEROBIC ONLY Blood Culture adequate volume Performed at [place] [address] [place], [place] [address]    Culture   Final    NO GROWTH 4 DAYS Performed at [facility], [place] [address], [place], [place] [address]    Report Status PENDING  Incomplete         Radiology Studies: No results found.  Scheduled Meds:  carisoprodol  350 mg Oral TID   diltiazem  360 mg Oral Daily   docusate sodium  100 mg Oral BID   escitalopram  10 mg Oral Daily   fluconazole  200 mg Oral Daily   gabapentin  600 mg Oral BID   ipratropium  0.5 mg Nebulization TID   levalbuterol  0.63 mg Nebulization TID   linaclotide  145 mcg Oral q1600   loratadine  10 mg Oral Daily   mouth rinse  15 mL Mouth Rinse BID   methylPREDNISolone (SOLU-MEDROL) injection  20 mg Intravenous Daily    metoprolol tartrate  75 mg Oral Q8H   pantoprazole  40 mg Oral Daily   polyethylene glycol  34 g Oral q AM   rosuvastatin  5 mg Oral Once per day on Mon Wed Fri   sodium chloride  1 g Oral TID   sucralfate  1 g Oral TID WC & HS   traMADol  100 mg Oral Q8H   valACYclovir  1,000 mg Oral BID   Continuous Infusions:  sodium chloride       LOS: 7 days    Time spent: 25 mins    Shawna Clamp, MD [facility]   If 7PM-7AM, please contact night-coverage

## 2021-01-01 NOTE — Progress Notes (Addendum)
Progress Note  Patient Name: Patricia [name] Date of Encounter: 01/01/2021  Primary Cardiologist: Dr. Reggy Eye  Subjective   No chest pain or progressive shortness of breath.  Inpatient Medications    Scheduled Meds:  carisoprodol  350 mg Oral TID   diltiazem  360 mg Oral Daily   docusate sodium  100 mg Oral BID   escitalopram  10 mg Oral Daily   fluconazole  200 mg Oral Daily   gabapentin  600 mg Oral BID   ipratropium  0.5 mg Nebulization TID   levalbuterol  0.63 mg Nebulization TID   linaclotide  145 mcg Oral q1600   loratadine  10 mg Oral Daily   mouth rinse  15 mL Mouth Rinse BID   methylPREDNISolone (SOLU-MEDROL) injection  20 mg Intravenous Daily   metoprolol tartrate  100 mg Oral BID   pantoprazole  40 mg Oral Daily   polyethylene glycol  34 g Oral q AM   rosuvastatin  5 mg Oral Once per day on Mon Wed Fri   sodium chloride  1 g Oral TID   sucralfate  1 g Oral TID WC & HS   traMADol  100 mg Oral Q8H   valACYclovir  1,000 mg Oral BID   Continuous Infusions:  sodium chloride     magnesium sulfate bolus IVPB     PRN Meds: sodium chloride, acetaminophen **OR** acetaminophen, bisacodyl, clonazePAM, guaiFENesin-dextromethorphan, hydrALAZINE, HYDROcodone-acetaminophen, ipratropium, levalbuterol, lidocaine, morphine injection, ondansetron **OR** ondansetron (ZOFRAN) IV, polyvinyl alcohol, prochlorperazine, zolpidem   Vital Signs    Vitals:   12/31/20 2022 12/31/20 2130 01/01/21 0511 01/01/21 0833  BP:  (!) 127/96 115/89   Pulse:  (!) 106 (!) 110   Resp:  16 16   Temp:  97.7 F (36.5 C) 97.8 F (36.6 C)   TempSrc:  Oral Oral   SpO2: 97% 95% 96% 97%  Weight:      Height:        Intake/Output Summary (Last 24 hours) at 01/01/2021 1037 Last data filed at 01/01/2021 0859 Gross per 24 hour  Intake 1320 ml  Output 1200 ml  Net 120 ml   Filed Weights   12/25/20 1226 12/26/20 2037  Weight: 85.7 kg 92.8 kg    Telemetry    Atrial fibrillation  with RVR, heart rate generally is better however.  Personally reviewed.  ECG    No ECG reviewed.  Physical Exam   GEN: No acute distress.   Neck: No JVD. Cardiac: Irregularly irregular without gallop.  Respiratory: Nonlabored. Clear to auscultation bilaterally. GI: Soft, nontender, bowel sounds present. MS: No edema.  Labs    Chemistry Recent Labs  Lab 12/25/20 1221 12/26/20 0344 12/29/20 0445 12/30/20 0511 01/01/21 0529  NA 132*   < > 133* 133* 133*  K 4.3   < > 3.5 3.6 3.7  CL 97*   < > 96* 96* 95*  CO2 26   < > 30 29 32  GLUCOSE 119*   < > 122* 111* 84  BUN 23   < > 23 28* 26*  CREATININE 0.95   < > 0.80 0.77 0.75  CALCIUM 8.0*   < > 8.6* 8.4* 8.3*  PROT 5.6*  --   --   --   --   ALBUMIN 3.1*  --   --   --   --   AST 50*  --   --   --   --   ALT 54*  --   --   --   --  ALKPHOS 105  --   --   --   --   BILITOT 0.5  --   --   --   --   GFRNONAA >60   < > >60 >60 >60  ANIONGAP 9   < > 7 8 6    < > = values in this interval not displayed.     Hematology Recent Labs  Lab 12/29/20 0445 12/30/20 0511 01/01/21 0529  WBC 7.5 9.7 7.8  RBC 2.86* 2.93* 3.17*  HGB 9.3* 9.5* 10.3*  HCT 27.0* 27.5* 30.2*  MCV 94.4 93.9 95.3  MCH 32.5 32.4 32.5  MCHC 34.4 34.5 34.1  RDW 18.3* 18.1* 18.2*  PLT 33* 38* 52*    Cardiac Enzymes Recent Labs  Lab 12/18/20 1100 12/25/20 1221 12/25/20 1713  TROPONINIHS 9 11 11    Assessment & Plan    1.  Persistent atrial fibrillation with recent RVR in the setting of acute illness.  Currently on Cardizem CD 360 mg daily and Lopressor which was uptitrated to 100 mg twice daily yesterday.  CHA2DS2-VASc score is 4.  She is currently not anticoagulated in light of acute thrombocytopenia following chemotherapy.  Platelet count up to 52.  2.  Small cell lung cancer status postchemotherapy and XRT.  Would still hold off on resuming anticoagulation until platelet count is higher, likely within the next week.  Continue Cardizem CD 360 mg  daily and change Lopressor to 75 mg every 8 hours.  Do not expect perfect heart rate control, however if the trend is better she could likely be discharged tomorrow for close outpatient follow-up.  Signed, Rozann Lesches, MD  01/01/2021, 10:37 AM

## 2021-01-02 DIAGNOSIS — I480 Paroxysmal atrial fibrillation: Secondary | ICD-10-CM | POA: Diagnosis not present

## 2021-01-02 LAB — BASIC METABOLIC PANEL
Anion gap: 7 (ref 5–15)
BUN: 18 mg/dL (ref 8–23)
CO2: 34 mmol/L — ABNORMAL HIGH (ref 22–32)
Calcium: 8.3 mg/dL — ABNORMAL LOW (ref 8.9–10.3)
Chloride: 93 mmol/L — ABNORMAL LOW (ref 98–111)
Creatinine, Ser: 0.7 mg/dL (ref 0.44–1.00)
GFR, Estimated: >60 mL/min (ref >60)
Glucose, Bld: 87 mg/dL (ref 70–99)
Potassium: 3.4 mmol/L — ABNORMAL LOW (ref 3.5–5.1)
Sodium: 134 mmol/L — ABNORMAL LOW (ref 135–145)

## 2021-01-02 LAB — MAGNESIUM: Magnesium: 1.6 mg/dL — ABNORMAL LOW (ref 1.7–2.4)

## 2021-01-02 LAB — CBC
HCT: 33.7 % — ABNORMAL LOW (ref 36.0–46.0)
Hemoglobin: 11.7 g/dL — ABNORMAL LOW (ref 12.0–15.0)
MCH: 33 pg (ref 26.0–34.0)
MCHC: 34.7 g/dL (ref 30.0–36.0)
MCV: 94.9 fL (ref 80.0–100.0)
Platelets: 55 10*3/uL — ABNORMAL LOW (ref 150–400)
RBC: 3.55 MIL/uL — ABNORMAL LOW (ref 3.87–5.11)
RDW: 18.6 % — ABNORMAL HIGH (ref 11.5–15.5)
WBC: 7.9 10*3/uL (ref 4.0–10.5)
nRBC: 0.5 % — ABNORMAL HIGH (ref 0.0–0.2)

## 2021-01-02 LAB — PHOSPHORUS: Phosphorus: 4.2 mg/dL (ref 2.5–4.6)

## 2021-01-02 MED ORDER — MAGNESIUM SULFATE 2 GM/50ML IV SOLN
2.0000 g | Freq: Once | INTRAVENOUS | Status: AC
  Administered 2021-01-02: 09:00:00 2 g via INTRAVENOUS
  Filled 2021-01-02: qty 50

## 2021-01-02 MED ORDER — POTASSIUM CHLORIDE 20 MEQ PO PACK
40.0000 meq | PACK | Freq: Once | ORAL | Status: AC
  Administered 2021-01-02: 09:00:00 40 meq via ORAL
  Filled 2021-01-02: qty 2

## 2021-01-02 NOTE — Progress Notes (Signed)
OT Cancellation Note  Patient Details Name: Patricia [name] MRN: [id number] DOB: Nov 19, 1950   Cancelled Treatment:    Reason Eval/Treat Not Completed: Medical issues which prohibited therapy Patients HR is 136-137 bpm at rest in bed. MD contacted via secure chat. MD asking for therapy to hold off right now. OT to continue to follow and check back as schedule will allow.  Jackelyn Poling OTR/L, [facility] Office# [phone] Pager# [phone]   01/02/2021, 11:23 AM

## 2021-01-02 NOTE — Progress Notes (Signed)
Progress Note  Patient Name: Patricia [name] Date of Encounter: 01/02/2021  [facility] Cardiologist: None Dr. Curt Bears  Subjective   No chest pain no shortness of breath while laying in bed.  She was able to walk with the nurse tech yesterday down the hallway.  Felt weak.  Inpatient Medications    Scheduled Meds:  carisoprodol  350 mg Oral TID   diltiazem  360 mg Oral Daily   docusate sodium  100 mg Oral BID   escitalopram  10 mg Oral Daily   gabapentin  600 mg Oral BID   ipratropium  0.5 mg Nebulization TID   levalbuterol  0.63 mg Nebulization TID   linaclotide  145 mcg Oral q1600   loratadine  10 mg Oral Daily   mouth rinse  15 mL Mouth Rinse BID   metoprolol tartrate  75 mg Oral Q8H   pantoprazole  40 mg Oral Daily   polyethylene glycol  34 g Oral q AM   potassium chloride  40 mEq Oral Once   rosuvastatin  5 mg Oral Once per day on Mon Wed Fri   sodium chloride  1 g Oral TID   sucralfate  1 g Oral TID WC & HS   traMADol  100 mg Oral Q8H   valACYclovir  1,000 mg Oral BID   Continuous Infusions:  sodium chloride     magnesium sulfate bolus IVPB     PRN Meds: sodium chloride, acetaminophen **OR** acetaminophen, bisacodyl, clonazePAM, guaiFENesin-dextromethorphan, hydrALAZINE, HYDROcodone-acetaminophen, ipratropium, levalbuterol, lidocaine, morphine injection, ondansetron **OR** ondansetron (ZOFRAN) IV, polyvinyl alcohol, prochlorperazine, zolpidem   Vital Signs    Vitals:   01/01/21 2039 01/01/21 2131 01/02/21 0459 01/02/21 0800  BP: (!) 120/93  (!) 143/101   Pulse: 96  (!) 110   Resp: 16  18   Temp: (!) 97.5 F (36.4 C)  98 F (36.7 C)   TempSrc: Oral  Oral   SpO2: 97% 97% 94% 95%  Weight:      Height:        Intake/Output Summary (Last 24 hours) at 01/02/2021 0836 Last data filed at 01/02/2021 0500 Gross per 24 hour  Intake 480 ml  Output 1400 ml  Net -920 ml   Last 3 Weights 12/26/2020 12/25/2020 12/23/2020  Weight (lbs) 204 lb 9.4 oz 189  lb 189 lb 4.8 oz  Weight (kg) 92.8 kg 85.73 kg 85.866 kg      Telemetry    Atrial fibrillation/flutter with RVR rates as described below- Personally Reviewed  ECG    No new- Personally Reviewed  Physical Exam   GEN: No acute distress.   Neck: No JVD Cardiac: Tachycardic regular, no murmurs, rubs, or gallops.  Respiratory: Clear to auscultation bilaterally. GI: Soft, nontender, non-distended  MS: No edema; No deformity.  Left ventral surface of wrist superficial thrombophlebitis Neuro:  Nonfocal  Psych: Normal affect   Labs    High Sensitivity Troponin:   Recent Labs  Lab 12/18/20 1100 12/25/20 1221 12/25/20 1713  TROPONINIHS 9 11 11      Chemistry Recent Labs  Lab 12/30/20 0511 01/01/21 0529 01/02/21 0441  NA 133* 133* 134*  K 3.6 3.7 3.4*  CL 96* 95* 93*  CO2 29 32 34*  GLUCOSE 111* 84 87  BUN 28* 26* 18  CREATININE 0.77 0.75 0.70  CALCIUM 8.4* 8.3* 8.3*  MG 1.7 1.5* 1.6*  GFRNONAA >60 >60 >60  ANIONGAP 8 6 7     Lipids No results for input(s): CHOL, TRIG,  HDL, LABVLDL, LDLCALC, CHOLHDL in the last 168 hours.  Hematology Recent Labs  Lab 12/30/20 0511 01/01/21 0529 01/02/21 0441  WBC 9.7 7.8 7.9  RBC 2.93* 3.17* 3.55*  HGB 9.5* 10.3* 11.7*  HCT 27.5* 30.2* 33.7*  MCV 93.9 95.3 94.9  MCH 32.4 32.5 33.0  MCHC 34.5 34.1 34.7  RDW 18.1* 18.2* 18.6*  PLT 38* 52* 55*   Thyroid No results for input(s): TSH, FREET4 in the last 168 hours.  BNPNo results for input(s): BNP, PROBNP in the last 168 hours.  DDimer No results for input(s): DDIMER in the last 168 hours.   Radiology    No results found.  Cardiac Studies   Echo EF 65%  Patient Profile     70 y.o. female with atrial fibrillation RVR [place] thrombocytopenia in the setting of small cell lung cancer status postchemotherapy and XRT  Assessment & Plan    Persistent atrial fibrillation/atrial flutter with recent RVR in the setting of acute illness - Agree with Cardizem CD 360 mg a day -  Metoprolol 75 mg every 8 hours.  This was changed on 01/01/2021 - Heart rates have fluctuated from 92-1 35.  Currently appears to be in a 2-1 atrial flutter type pattern fairly incessant in the 130s.  Overall is currently challenging to continue to control.  Still going to fast for discharge.  Lets see how she does today on the current medicine regimen.  Small cell lung cancer status postchemotherapy and XRT with thrombocytopenia - Hold off on using anticoagulation.  Bleeding risk too high.  Per hospitalist note, could consider once platelets reach greater than 50 on average.  This has been present over the past 2 days with 20, 2 but was recently 25,000 in the setting of recent chemotherapy.  I will defer to hospitalist team/oncology for restarting.  Left superficial thrombophlebitis of ventral surface of wrist. - Arm elevation, ice/heat       For questions or updates, please contact [place] Please consult [email or web address] for contact info under        Signed, Candee Furbish, MD  01/02/2021, 8:36 AM

## 2021-01-02 NOTE — Progress Notes (Signed)
PT Cancellation Note  Patient Details Name: Kataya Guimont MRN: [id number] DOB: [birth year]   Cancelled Treatment:    Reason Eval/Treat Not Completed: Other (comment). HR elevated at rest, MD requesting we hold eval until in therapeutic range. Will check back for PT eval as pt appropriate.    Tori Joleene Burnham PT, DPT 01/02/21, 11:24 AM

## 2021-01-02 NOTE — Progress Notes (Signed)
PROGRESS NOTE    Patricia [name]  YQI:[id number] DOB: February 12, 1950 DOA: 12/25/2020 PCP: [name], MD    Brief Narrative:  This 70 y.o. female with PMH significant of HTN; HLD; A. flutter scheduled for cardioversion next month; lung cancer (10/2020); and low back pain presenting with fatigue, SOB. She was found to be pancytopenic, on labs on admission. CXR does not show any pneumonia. UA is negative for infection. She was found to be in atrial fibrillation with RVR.  Cardiology consulted, medications adjusted and  heart rate is reasonably controlled. She has received 1 PRBC.  Pancytopenia improved.  She continued to remain in atrial fibrillation with uncontrolled heart rate.  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 Chest x-ray unremarkable.  Continue bronchodilators. She completed Solu-Medrol for 5 days. Continue supplemental oxygen and wean as tolerated.  Atrial fibrillation with RVR: Heart rate not well controlled. She was switched to Cardizem CD 360 daily. Metoprolol was increased to 100 mg twice daily.   Heart rate continued to remain elevated. Metoprolol changed to 75 mg q 8hrs. Last echocardiogram from 9/22 showed Left ventricular ejection fraction, by estimation, is 60 to 65%.  Eliquis on hold due to severe thrombocytopenia from the chemotherapy.  Resume Eliquis when platelet count continues to remain above 50 K.    Primary Small cell lung CA left lung: S/p chemoradiation with cisplatin and  etoposide . S/p 3 cycles. She presented with pancytopenia requiring 1 unit of PRBC and platelets. Hb improved 11.7, platelet 55K, ANC improved to 500 She remains on Granix injections.  Oncology is on the board.   Essential hypertension: Blood pressure controlled. Continue Cardizem and metoprolol.  Hyperlipidemia: Continue statin.  History of esophagitis: Continue Diflucan and Protonix.   Adjustment disorder: Continue Lexapro and Klonopin.   Genital HSV: Continue Valtrex.   Chronic pain syndrome: Continue home medications .  Diarrhea> Resolved  Elevated liver enzymes: Continue to monitor     Anemia of chronic disease: Baseline hemoglobin around 8  Remains at baseline.  Transfusion threshold less than 7   Hypomagnesemia : Replaced.  Continue to monitor   DVT prophylaxis:  SCDs Code Status:  DNR Family Communication:  No family at bed side. Disposition Plan:   Status is: Inpatient  Remains inpatient appropriate because:  Admitted for acute hypoxic respiratory failure and A. fib with RVR requiring IV Cardizem.  Anticipated discharge home in 1 to 2 days once heart rate controlled.  Consultants:  Cardiology Oncology  Procedures: None  Antimicrobials:   Anti-infectives (From admission, onward)    Start     Dose/Rate Route Frequency Ordered Stop   12/26/20 1000  fluconazole (DIFLUCAN) tablet 200 mg       Note to Pharmacy: Take 400 mg on first day, then 200 mg per day     200 mg Oral Daily 12/25/20 1700 01/01/21 2359   12/25/20 1730  valACYclovir (VALTREX) tablet 1,000 mg        1,000 mg Oral 2 times daily 12/25/20 1724 01/04/21 0959  12/25/20 1715  valACYclovir (VALTREX) tablet 1,000 mg  Status:  Discontinued        1,000 mg Oral 3 times daily 12/25/20 1700 12/25/20 1724   12/25/20 1545  cefTRIAXone (ROCEPHIN) 2 g in sodium chloride 0.9 % 100 mL IVPB        2 g 200 mL/hr over 30 Minutes Intravenous  Once 12/25/20 1541 12/26/20 0006         Subjective: Patient was seen and examined at bedside.  Overnight events noted.   Her heart rate still remains elevated above 130.  Medications adjusted. Platelet count is above 52, feels better but concerned about elevated heart rate. Patient denies any chest pain, remains 3 L of supplemental oxygen.  Objective: Vitals:   01/01/21 2131 01/02/21 0459 01/02/21 0800 01/02/21 1256  BP:  (!) 143/101  108/72  Pulse:  (!) 110  99  Resp:  18  16  Temp:  98 F (36.7 C)  97.8 F (36.6 C)  TempSrc:  Oral  Oral  SpO2: 97% 94% 95% 97%  Weight:      Height:        Intake/Output Summary (Last 24 hours) at 01/02/2021 1347 Last data filed at 01/02/2021 1200 Gross per 24 hour  Intake --  Output 2200 ml  Net -2200 ml   Filed Weights   12/25/20 1226 12/26/20 2037  Weight: 85.7 kg 92.8 kg    Examination:  General exam: Appears comfortable, not in any acute distress, chronically ill looking. Respiratory system: Clear to auscultation bilaterally, no wheezing, no crackles. Cardiovascular system: S1 & S2 heard, Irregular rhythm, no murmur. Gastrointestinal system: Abdomen is soft, non tender, non distended, BS+ Central nervous system: Alert and oriented x 3. No focal neurological deficits. Extremities: No edema, no cyanosis, no clubbing. Skin: No rashes, lesions or ulcers Psychiatry: Judgement and insight appear normal. Mood & affect appropriate.     Data Reviewed: I have personally reviewed following labs and imaging studies  CBC: Recent Labs  Lab 12/27/20 0328 12/28/20 0511 12/29/20 0445 12/30/20 0511 01/01/21 0529 01/02/21 0441  WBC 1.0* 3.0* 7.5 9.7 7.8 7.9  NEUTROABS 0.5* 2.5 6.5  --   --   --   HGB 8.5* 8.9* 9.3* 9.5* 10.3* 11.7*  HCT 24.4* 25.3* 27.0* 27.5* 30.2* 33.7*  MCV 92.4 94.4 94.4 93.9 95.3 94.9  PLT 25* 29* 33* 38* 52* 55*   Basic Metabolic Panel: Recent Labs  Lab 12/27/20 0328 12/28/20 0511 12/29/20 0445 12/30/20 0511 01/01/21 0529 01/02/21 0441   NA 133* 132* 133* 133* 133* 134*  K 3.5 3.4* 3.5 3.6 3.7 3.4*  CL 100 95* 96* 96* 95* 93*  CO2 25 28 30 29  32 34*  GLUCOSE 115* 139* 122* 111* 84 87  BUN 12 13 23  28* 26* 18  CREATININE 0.80 0.75 0.80 0.77 0.75 0.70  CALCIUM 8.0* 8.4* 8.6* 8.4* 8.3* 8.3*  MG 1.3*  --  1.6* 1.7 1.5* 1.6*  PHOS  --   --  3.5 3.6 4.4 4.2   GFR: Estimated Creatinine Clearance: 74 mL/min (by C-G formula based on SCr of 0.7 mg/dL). Liver Function Tests: No results for input(s): AST, ALT, ALKPHOS, BILITOT, PROT, ALBUMIN in the last 168 hours.  No results for input(s): LIPASE, AMYLASE in the last 168 hours. No results for input(s): AMMONIA in the last 168 hours. Coagulation Profile: No results for input(s): INR, PROTIME in the last 168 hours.  Cardiac Enzymes: No results for input(s): CKTOTAL, CKMB, CKMBINDEX, TROPONINI in  the last 168 hours. BNP (last 3 results) No results for input(s): PROBNP in the last 8760 hours. HbA1C: No results for input(s): HGBA1C in the last 72 hours. CBG: No results for input(s): GLUCAP in the last 168 hours.  Lipid Profile: No results for input(s): CHOL, HDL, LDLCALC, TRIG, CHOLHDL, LDLDIRECT in the last 72 hours. Thyroid Function Tests: No results for input(s): TSH, T4TOTAL, FREET4, T3FREE, THYROIDAB in the last 72 hours. Anemia Panel: No results for input(s): VITAMINB12, FOLATE, FERRITIN, TIBC, IRON, RETICCTPCT in the last 72 hours. Sepsis Labs: No results for input(s): PROCALCITON, LATICACIDVEN in the last 168 hours.   Recent Results (from the past 240 hour(s))  Blood culture (routine x 2)     Status: None   Collection Time: 12/25/20 11:57 AM   Specimen: BLOOD  Result Value Ref Range Status   Specimen Description BLOOD LEFT ANTECUBITAL  Final   Special Requests   Final    BOTTLES DRAWN AEROBIC AND ANAEROBIC Blood Culture results may not be optimal due to an inadequate volume of blood received in culture bottles   Culture   Final    NO GROWTH 5 DAYS Performed  at [facility], [place] [address] [place], [place] [address]    Report Status 12/30/2020 FINAL  Final  Resp Panel by RT-PCR (Flu A&B, Covid) Nasopharyngeal Swab     Status: None   Collection Time: 12/25/20  8:34 PM   Specimen: Nasopharyngeal Swab; Nasopharyngeal(NP) swabs in vial transport medium  Result Value Ref Range Status   SARS Coronavirus 2 by RT PCR NEGATIVE NEGATIVE Final    Comment: (NOTE) SARS-CoV-2 target nucleic acids are NOT DETECTED.  The SARS-CoV-2 RNA is generally detectable in upper respiratory specimens during the acute phase of infection. The lowest concentration of SARS-CoV-2 viral copies this assay can detect is 138 copies/mL. A negative result does not preclude SARS-Cov-2 infection and should not be used as the sole basis for treatment or other patient management decisions. A negative result may occur with  improper specimen collection/handling, submission of specimen other than nasopharyngeal swab, presence of viral mutation(s) within the areas targeted by this assay, and inadequate number of viral copies(<138 copies/mL). A negative result must be combined with clinical observations, patient history, and epidemiological information. The expected result is Negative.  Fact Sheet for Patients:  EntrepreneurPulse.com.au  Fact Sheet for Healthcare Providers:  IncredibleEmployment.be  This test is no t yet approved or cleared by the [place] FDA and  has been authorized for detection and/or diagnosis of SARS-CoV-2 by FDA under an Emergency Use Authorization (EUA). This EUA will remain  in effect (meaning this test can be used) for the duration of the COVID-19 declaration under Section 564(b)(1) of the Act, 21 U.S.C.section 360bbb-3(b)(1), unless the authorization is terminated  or revoked sooner.       Influenza A by PCR NEGATIVE NEGATIVE Final   Influenza B by PCR NEGATIVE NEGATIVE Final    Comment: (NOTE) The  Xpert Xpress SARS-CoV-2/FLU/RSV plus assay is intended as an aid in the diagnosis of influenza from Nasopharyngeal swab specimens and should not be used as a sole basis for treatment. Nasal washings and aspirates are unacceptable for Xpert Xpress SARS-CoV-2/FLU/RSV testing.  Fact Sheet for Patients: EntrepreneurPulse.com.au  Fact Sheet for Healthcare Providers: IncredibleEmployment.be  This test is not yet approved or cleared by the [place] FDA and has been authorized for detection and/or diagnosis of SARS-CoV-2 by FDA under an Emergency Use Authorization (EUA). This EUA will remain  in effect (meaning this test can be used) for the duration of the COVID-19 declaration under Section 564(b)(1) of the Act, 21 U.S.C. section 360bbb-3(b)(1), unless the authorization is terminated or revoked.  Performed at [facility], [place] [address], [place], [place] [address]   Blood culture (routine x 2)     Status: None   Collection Time: 12/27/20  3:26 PM   Specimen: BLOOD  Result Value Ref Range Status   Specimen Description   Final    BLOOD BLOOD RIGHT HAND Performed at [place] [address], [place], [place] [address]    Special Requests   Final    BOTTLES DRAWN AEROBIC ONLY Blood Culture adequate volume Performed at [place] [address], [place], [place] [address]    Culture   Final    NO GROWTH 5 DAYS Performed at [facility], [place] [address] [place], [place] [address]    Report Status 01/01/2021 FINAL  Final         Radiology Studies: No results found.  Scheduled Meds:  carisoprodol  350 mg Oral TID   diltiazem  360 mg Oral Daily   docusate sodium  100 mg Oral BID   escitalopram  10 mg Oral Daily   gabapentin  600 mg Oral BID   ipratropium  0.5 mg Nebulization TID   levalbuterol  0.63 mg Nebulization TID   linaclotide  145 mcg Oral q1600   loratadine  10 mg Oral  Daily   mouth rinse  15 mL Mouth Rinse BID   metoprolol tartrate  75 mg Oral Q8H   pantoprazole  40 mg Oral Daily   polyethylene glycol  34 g Oral q AM   rosuvastatin  5 mg Oral Once per day on Mon Wed Fri   sodium chloride  1 g Oral TID   sucralfate  1 g Oral TID WC & HS   traMADol  100 mg Oral Q8H   valACYclovir  1,000 mg Oral BID   Continuous Infusions:  sodium chloride       LOS: 8 days    Time spent: 25 mins    Shawna Clamp, MD [facility]   If 7PM-7AM, please contact night-coverage

## 2021-01-03 ENCOUNTER — Ambulatory Visit: Payer: Medicare HMO

## 2021-01-03 ENCOUNTER — Other Ambulatory Visit: Payer: Medicare HMO

## 2021-01-03 ENCOUNTER — Encounter (HOSPITAL_COMMUNITY): Payer: Self-pay | Admitting: Cardiovascular Disease

## 2021-01-03 ENCOUNTER — Ambulatory Visit
Admission: RE | Admit: 2021-01-03 | Discharge: 2021-01-03 | Disposition: A | Discharge status: Home / Self Care | Payer: Medicare HMO | Source: Ambulatory Visit | Attending: Radiation Oncology | Admitting: Radiation Oncology

## 2021-01-03 DIAGNOSIS — C3432 Malignant neoplasm of lower lobe, left bronchus or lung: Secondary | ICD-10-CM | POA: Diagnosis not present

## 2021-01-03 DIAGNOSIS — I480 Paroxysmal atrial fibrillation: Secondary | ICD-10-CM | POA: Diagnosis not present

## 2021-01-03 LAB — CBC
HCT: 31.6 % — ABNORMAL LOW (ref 36.0–46.0)
Hemoglobin: 10.8 g/dL — ABNORMAL LOW (ref 12.0–15.0)
MCH: 32.8 pg (ref 26.0–34.0)
MCHC: 34.2 g/dL (ref 30.0–36.0)
MCV: 96 fL (ref 80.0–100.0)
Platelets: 58 10*3/uL — ABNORMAL LOW (ref 150–400)
RBC: 3.29 MIL/uL — ABNORMAL LOW (ref 3.87–5.11)
RDW: 18.6 % — ABNORMAL HIGH (ref 11.5–15.5)
WBC: 6.5 10*3/uL (ref 4.0–10.5)
nRBC: 0.3 % — ABNORMAL HIGH (ref 0.0–0.2)

## 2021-01-03 LAB — PHOSPHORUS: Phosphorus: 4.2 mg/dL (ref 2.5–4.6)

## 2021-01-03 LAB — BASIC METABOLIC PANEL
Anion gap: 6 (ref 5–15)
BUN: 17 mg/dL (ref 8–23)
CO2: 32 mmol/L (ref 22–32)
Calcium: 7.9 mg/dL — ABNORMAL LOW (ref 8.9–10.3)
Chloride: 93 mmol/L — ABNORMAL LOW (ref 98–111)
Creatinine, Ser: 0.63 mg/dL (ref 0.44–1.00)
GFR, Estimated: >60 mL/min (ref >60)
Glucose, Bld: 93 mg/dL (ref 70–99)
Potassium: 3.7 mmol/L (ref 3.5–5.1)
Sodium: 131 mmol/L — ABNORMAL LOW (ref 135–145)

## 2021-01-03 LAB — MAGNESIUM: Magnesium: 1.7 mg/dL (ref 1.7–2.4)

## 2021-01-03 MED ORDER — VALACYCLOVIR HCL 1 G PO TABS
1000.0000 mg | ORAL_TABLET | Freq: Two times a day (BID) | ORAL | 0 refills | Status: AC
Start: 2021-01-03 — End: 2021-01-08

## 2021-01-03 MED ORDER — GUAIFENESIN-DM 100-10 MG/5ML PO SYRP: 10.0000 mL | ORAL_SOLUTION | ORAL | 0 refills | Status: DC | PRN

## 2021-01-03 MED ORDER — DILTIAZEM HCL ER COATED BEADS 360 MG PO CP24: 360.0000 mg | ORAL_CAPSULE | Freq: Every day | ORAL | 1 refills | Status: DC

## 2021-01-03 MED ORDER — ESCITALOPRAM OXALATE 10 MG PO TABS: 10.0000 mg | ORAL_TABLET | Freq: Every day | ORAL | 2 refills | Status: DC

## 2021-01-03 MED ORDER — DOCUSATE SODIUM 100 MG PO CAPS: 100.0000 mg | ORAL_CAPSULE | Freq: Two times a day (BID) | ORAL | 0 refills | Status: DC

## 2021-01-03 MED ORDER — METOPROLOL TARTRATE 75 MG PO TABS: 75.0000 mg | ORAL_TABLET | Freq: Three times a day (TID) | ORAL | 2 refills | Status: DC

## 2021-01-03 NOTE — Evaluation (Signed)
Occupational Therapy Evaluation Patient Details Name: Patricia [name] MRN: [id number] DOB: [birth year] Today's Date: 01/03/2021   History of Present Illness 70 y.o. female admitted on 12/25/20 due to fatigue, SOB. 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  Pt with significant PMH of HTN, back surgery, ORIF radius and ulna fx R.   Clinical Impression   Patient is currently requiring assistance with ADLs including Min guard assist with toileting, moderate assist with standing LE dressing, minimal assist with bathing, and setup assist with seated UE dressing and grooming.  Current level of function is below patient's typical baseline.  During this evaluation, patient was limited by generalized weakness, impaired activity tolerance, and need of supplemental O2 with desaturation at risk and with activity to 82% and pt symptomatic, all of which has the potential to impact patient's safety and independence during functional mobility, as well as performance for ADLs.  Patient lives with her husband, who pt describes as strong, and is able to provide 24/7 supervision and assistance.  Patient demonstrates good rehab potential, and should benefit from continued skilled occupational therapy services while in acute care to maximize safety, independence and quality of life at home.  Continued occupational therapy services in the home is recommended.  ? SATURATION QUALIFICATIONS: (This note is used to comply with regulatory documentation for home oxygen)   Patient Saturations on Room Air at Rest = %82   Patient Saturations on Room Air while Ambulating = %82   Patient Saturations on 2 Liters of oxygen while Ambulating = %90   Please briefly explain why patient needs home oxygen: to maintain O2 >88% during mobility.       Recommendations for follow up therapy are one component of a multi-disciplinary discharge planning  process, led by the attending physician.  Recommendations may be updated based on patient status, additional functional criteria and insurance authorization.   Follow Up Recommendations  Home health OT    Assistance Recommended at Discharge Intermittent Supervision/Assistance  Functional Status Assessment  Patient has had a recent decline in their functional status and demonstrates the ability to make significant improvements in function in a reasonable and predictable amount of time.  Equipment Recommendations  Other (comment) (Clamp on tub bar. Pt educated on options.)    Recommendations for Other Services       Precautions / Restrictions Precautions Precautions: Fall Precaution Comments: monitor HR, O2 Restrictions Weight Bearing Restrictions: No      Mobility Bed Mobility Overal bed mobility: Modified Independent             General bed mobility comments: oob in recliner    Transfers Overall transfer level: Needs assistance Equipment used: Rolling walker (2 wheels) Transfers: Sit to/from Stand Sit to Stand: Min assist           General transfer comment: Min Assist from low EOB.      Balance Overall balance assessment: Needs assistance   Sitting balance-Leahy Scale: Good     Standing balance support: Bilateral upper extremity supported;Reliant on assistive device for balance Standing balance-Leahy Scale: Poor                             ADL either performed or assessed with clinical judgement   ADL Overall ADL's : Needs assistance/impaired Eating/Feeding: Independent   Grooming: Oral care;Standing;Min guard;Wash/dry face Grooming Details (indicate cue type and reason): Pt stood at  sink, with Bil forearms resting on vanity for support/balance and completed washing face brushing teeth with Min guard.  Pt washed hands at bed level with setup. Upper Body Bathing: Set up;Supervision/ safety;Sitting   Lower Body Bathing: Minimal  assistance;Sitting/lateral leans   Upper Body Dressing : Set up;Sitting   Lower Body Dressing: Set up;Sitting/lateral leans;Moderate assistance;Sit to/from stand Lower Body Dressing Details (indicate cue type and reason): Pt able to don socks in long sitting with setup.  Mod Assist for standing LB dressing. Toilet Transfer: Minimal assistance;BSC/3in1;Rolling walker (2 wheels) Toilet Transfer Details (indicate cue type and reason): Pt able to stand from low EOB with Min As to power up, and use RW to pivot EOB>sink>recliner with Min As. Toileting- Clothing Manipulation and Hygiene: Min guard;Sitting/lateral lean       Functional mobility during ADLs: Minimal assistance;Min guard;Rolling walker (2 wheels)       Vision Baseline Vision/History: 1 Wears glasses Ability to See in Adequate Light: 0 Adequate Patient Visual Report: No change from baseline Vision Assessment?: No apparent visual deficits     Perception Perception Perception: Within Functional Limits   Praxis Praxis Praxis: Intact    Pertinent Vitals/Pain Pain Assessment: No/denies pain     Hand Dominance Right   Extremity/Trunk Assessment Upper Extremity Assessment Upper Extremity Assessment: RUE deficits/detail;LUE deficits/detail RUE Deficits / Details: AROM, MMT: WFL RUE Coordination: decreased fine motor (from previous wrist injury) LUE Deficits / Details: AROM: WFL. Shoulder MMT: ~3+/5. Elbow>grip: 4-/5.  Forearm with redness and edema. Pt reports this is recently diagnosed cellulitis. LUE Coordination: decreased fine motor (from previous wrist injury)   Lower Extremity Assessment Lower Extremity Assessment: Generalized weakness   Cervical / Trunk Assessment Cervical / Trunk Assessment: Normal   Communication Communication Communication: No difficulties   Cognition Arousal/Alertness: Awake/alert Behavior During Therapy: WFL for tasks assessed/performed Overall Cognitive Status: Within Functional Limits  for tasks assessed                                       General Comments       Exercises     Shoulder Instructions      Home Living Family/patient expects to be discharged to:: Private residence Living Arrangements: Spouse/significant other Available Help at Discharge: Available 24 hours/day Type of Home: House Home Access: Stairs to enter [employer] of Steps: 3 Entrance Stairs-Rails: Right;Left Home Layout: One level     Bathroom Shower/Tub: Tub/shower unit;Walk-in shower (Pt uses the tub shower.)   Bathroom Toilet: Standard     Home Equipment: International aid/development worker (2 wheels);Hand held shower head;Adaptive equipment Adaptive Equipment: Reacher Additional Comments: Motorized recliner without lift function. Has gait belt      Prior Functioning/Environment Prior Level of Function : Independent/Modified Independent             Mobility Comments: Was ambulating without AD. ADLs Comments: Has been Independent with basic ADLs.  Pt shares chores with spouse.        OT Problem List: Decreased strength;Decreased coordination;Cardiopulmonary status limiting activity;Decreased activity tolerance;Impaired balance (sitting and/or standing);Decreased knowledge of use of DME or AE      OT Treatment/Interventions: Self-care/ADL training;Therapeutic exercise;Therapeutic activities;Energy conservation;Patient/family education;DME and/or AE instruction;Balance training    OT Goals(Current goals can be found in the care plan section) Acute Rehab OT Goals Patient Stated Goal: To go home with O2 OT Goal Formulation: With patient Time For Goal Achievement: 01/17/21  Potential to Achieve Goals: Good ADL Goals Pt Will Perform Lower Body Dressing: with adaptive equipment;sit to/from stand;sitting/lateral leans;with set-up Pt Will Transfer to Toilet: with modified independence;ambulating;bedside commode Pt Will Perform Toileting - Clothing  Manipulation and hygiene: with modified independence;with adaptive equipment Pt/caregiver will Perform Home Exercise Program: Increased strength;Both right and left upper extremity Additional ADL Goal #1: Pt will engage in 10+ min functional activities with combination of seated and standing tasks, without loss of balance using LRAD, in order to demonstrate improved activity tolerance and balance needed to perform ADLs safely at home. Additional ADL Goal #2: Patient will identify at least 3 energy conservation strategies to employ at home in order to maximize function and quality of life and decrease caregiver burden while preventing exacerbation of symptoms and rehospitalization.  OT Frequency: Min 2X/week   Barriers to D/C:    None known       Co-evaluation              AM-PAC OT "6 Clicks" Daily Activity     Outcome Measure Help from another person eating meals?: None Help from another person taking care of personal grooming?: A Little Help from another person toileting, which includes using toliet, bedpan, or urinal?: A Little Help from another person bathing (including washing, rinsing, drying)?: A Little Help from another person to put on and taking off regular upper body clothing?: A Little Help from another person to put on and taking off regular lower body clothing?: A Lot 6 Click Score: 18   End of Session Equipment Utilized During Treatment: Gait belt;Rolling walker (2 wheels) Nurse Communication: Mobility status  Activity Tolerance: Patient limited by fatigue Patient left: in chair;with call bell/phone within reach  OT Visit Diagnosis: Unsteadiness on feet (R26.81);History of falling (Z91.81);Muscle weakness (generalized) (M62.81)                Time: 4656-8127 OT Time Calculation (min): 46 min Charges:  OT General Charges $OT Visit: 1 Visit OT Evaluation $OT Eval Low Complexity: 1 Low OT Treatments $Self Care/Home Management : 8-22 mins $Therapeutic Activity:  8-22 mins  Anderson Malta, OT Acute Rehab Services Office: [phone] 01/03/2021  Julien Girt 01/03/2021, 11:01 AM

## 2021-01-03 NOTE — TOC Transition Note (Signed)
Transition of Care [facility]) - CM/SW Discharge Note   Patient Details  Name: Patricia [name] MRN: [id number] Date of Birth: [birth year]  Transition of Care [facility] CM/SW Contact:  Ross Ludwig, LCSW Phone Number: 01/03/2021, 2:43 PM   Clinical Narrative:     CSW spoke to patient to discuss home health agency choice and equipment needs.  Per patient she said she does not have a preference for an agency.  CSW spoke to [place] at [facility], and they can accept patient for [facility] PT, OT, and RN.  Patient also has new oxygen, patient did not have a preference for oxygen company, CSW was able to contact Ashleigh at [facility] and they will provide oxygen for patient.  CSW informed patient's daughter [name] that oxygen will be delivered to the room and also [facility] will contact patient within 24-48 hours to set up the first visit for home health.  Patient and daughter did not express any other needs or concerns, CSW signing off.   Final next level of care: [place] Barriers to Discharge: Barriers Resolved   Patient Goals and CMS Choice Patient states their goals for this hospitalization and ongoing recovery are:: To go home with home health. CMS Medicare.gov Compare Post Acute Care list provided to:: Patient Choice offered to / list presented to : Patient, Adult Children  Discharge Placement  Home with home health.                     Discharge Plan and Services   Discharge Planning Services: CM Consult            DME Arranged: Oxygen DME Agency: [facility] Date DME Agency Contacted: 01/03/21 Time DME Agency Contacted: [phone] Representative spoke with at DME Agency: [place]: RN, PT, OT [facility] Agency: [facility] Date [name]: 01/03/21 Time [place]: 1443 Representative spoke with at [place]: [place] ([place]) Interventions     Readmission Risk Interventions Readmission Risk Prevention Plan  10/17/2020  Post Dischage Appt Complete  Medication Screening Complete  Transportation Screening Complete  Some recent data might be hidden

## 2021-01-03 NOTE — Progress Notes (Addendum)
Progress Note  Patient Name: Patricia [name] Date of Encounter: 01/03/2021  [facility] Cardiologist: Will Meredith Leeds, MD   Subjective   No chest pain or SOB.  Sitting up in chair, just finished therapy.   Inpatient Medications    Scheduled Meds:  carisoprodol  350 mg Oral TID   diltiazem  360 mg Oral Daily   docusate sodium  100 mg Oral BID   escitalopram  10 mg Oral Daily   gabapentin  600 mg Oral BID   ipratropium  0.5 mg Nebulization TID   levalbuterol  0.63 mg Nebulization TID   linaclotide  145 mcg Oral q1600   loratadine  10 mg Oral Daily   mouth rinse  15 mL Mouth Rinse BID   metoprolol tartrate  75 mg Oral Q8H   pantoprazole  40 mg Oral Daily   polyethylene glycol  34 g Oral q AM   rosuvastatin  5 mg Oral Once per day on Mon Wed Fri   sodium chloride  1 g Oral TID   sucralfate  1 g Oral TID WC & HS   traMADol  100 mg Oral Q8H   valACYclovir  1,000 mg Oral BID   Continuous Infusions:  sodium chloride     PRN Meds: sodium chloride, acetaminophen **OR** acetaminophen, bisacodyl, clonazePAM, guaiFENesin-dextromethorphan, hydrALAZINE, HYDROcodone-acetaminophen, ipratropium, levalbuterol, lidocaine, morphine injection, ondansetron **OR** ondansetron (ZOFRAN) IV, polyvinyl alcohol, prochlorperazine, zolpidem   Vital Signs    Vitals:   01/02/21 2020 01/03/21 0524 01/03/21 0835 01/03/21 0907  BP:  100/65    Pulse:  71  70  Resp:  19    Temp:  98.2 F (36.8 C)    TempSrc:      SpO2: 96% 96% 96% 92%  Weight:      Height:        Intake/Output Summary (Last 24 hours) at 01/03/2021 0931 Last data filed at 01/02/2021 1200 Gross per 24 hour  Intake --  Output 800 ml  Net -800 ml   Last 3 Weights 12/26/2020 12/25/2020 12/23/2020  Weight (lbs) 204 lb 9.4 oz 189 lb 189 lb 4.8 oz  Weight (kg) 92.8 kg 85.73 kg 85.866 kg      Telemetry    Atrial fib rate 105 on average, freq PVCs . - Personally Reviewed  ECG    No new - Personally  Reviewed  Physical Exam   GEN: No acute distress.   Neck: No JVD Cardiac: irreg irreg, no murmurs, rubs, or gallops.  Respiratory: Clear to auscultation bilaterally. GI: Soft, nontender, non-distended  MS: No edema; No deformity. Neuro:  Nonfocal  Psych: Normal affect   Labs    High Sensitivity Troponin:   Recent Labs  Lab 12/18/20 1100 12/25/20 1221 12/25/20 1713  TROPONINIHS 9 11 11      Chemistry Recent Labs  Lab 01/01/21 0529 01/02/21 0441 01/03/21 0431  NA 133* 134* 131*  K 3.7 3.4* 3.7  CL 95* 93* 93*  CO2 32 34* 32  GLUCOSE 84 87 93  BUN 26* 18 17  CREATININE 0.75 0.70 0.63  CALCIUM 8.3* 8.3* 7.9*  MG 1.5* 1.6* 1.7  GFRNONAA >60 >60 >60  ANIONGAP 6 7 6     Lipids No results for input(s): CHOL, TRIG, HDL, LABVLDL, LDLCALC, CHOLHDL in the last 168 hours.  Hematology Recent Labs  Lab 01/01/21 0529 01/02/21 0441 01/03/21 0431  WBC 7.8 7.9 6.5  RBC 3.17* 3.55* 3.29*  HGB 10.3* 11.7* 10.8*  HCT 30.2* 33.7* 31.6*  MCV 95.3 94.9 96.0  MCH 32.5 33.0 32.8  MCHC 34.1 34.7 34.2  RDW 18.2* 18.6* 18.6*  PLT 52* 55* 58*   Thyroid No results for input(s): TSH, FREET4 in the last 168 hours.  BNPNo results for input(s): BNP, PROBNP in the last 168 hours.  DDimer No results for input(s): DDIMER in the last 168 hours.   Radiology    No results found.  Cardiac Studies   Echo 10/01/20 1. Left ventricular ejection fraction, by estimation, is 60 to 65%. The  left ventricle has normal function. The left ventricle has no regional  wall motion abnormalities. Left ventricular diastolic parameters are  consistent with Grade I diastolic  dysfunction (impaired relaxation).   2. Right ventricular systolic function is normal. The right ventricular  size is normal.   3. Prominent epicardial fat.   4. The mitral valve is normal in structure. No evidence of mitral valve  regurgitation. No evidence of mitral stenosis.   5. The aortic valve was not well visualized. There  is mild calcification  of the aortic valve. Aortic valve regurgitation is not visualized. Mild  aortic valve sclerosis is present, with no evidence of aortic valve  stenosis.   6. The inferior vena cava is normal in size with <50% respiratory  variability, suggesting right atrial pressure of 8 mmHg.   FINDINGS   Left Ventricle: Left ventricular ejection fraction, by estimation, is 60  to 65%. The left ventricle has normal function. The left ventricle has no  regional wall motion abnormalities. The left ventricular internal cavity  size was normal in size. There is   no left ventricular hypertrophy. Left ventricular diastolic parameters  are consistent with Grade I diastolic dysfunction (impaired relaxation).   Right Ventricle: The right ventricular size is normal. No increase in  right ventricular wall thickness. Right ventricular systolic function is  normal.   Patient Profile     70 y.o. 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  Assessment & Plan      Persistent atrial fibrillation/atrial flutter with recent RVR in the setting of acute illness -in Nov had been placed on flecainide this was stopped this admit now off anticoagulation. - Agree with Cardizem CD 360 mg a day - Metoprolol 75 mg every 8 hours.  This was changed on 01/01/2021 from 100 BID. - Heart rates have fluctuated from 92-135.  Currently appears to be in a 2-1 atrial flutter type pattern fairly incessant in the 130s.  Overall is currently challenging to continue to control.  HR overall 105 + PVCs. But upper numbers 117 not 135 today. 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  Bleeding risk too high.  Per hospitalist note, could consider once platelets reach greater than 50 on average.  This has been present  over the past 2 days with 2, 18 but was recently 25,000 in the setting of recent chemotherapy.  I will defer to hospitalist team/oncology for restarting.  Today plts 58k   --hyponatremia     Left superficial thrombophlebitis of ventral surface of wrist. - Arm elevation, ice/heat     For questions or updates, please contact [place] Please consult [email or web address] for contact info under        Signed, Cecilie Kicks, NP  01/03/2021, 9:31 AM     Attending Note:  The patient was seen and examined.  Agree with assessment and plan as noted above.  Changes made to the above note as needed.  Patient seen and independently examined with Cecilie Kicks, NP .   We discussed all aspects of the encounter. I agree with the assessment and plan as stated above.    Atrial fib:   HR is well controlled.   She is off anticoagulation due to thrombocytopenia and  the increased risk of bleeding.  We would be in favor of starting her on a DOAC as soon as Dr. Rogue Jury gives the [place]  She will see Tommye Standard, PA on Friday       I have spent a total of 40 minutes with patient reviewing hospital  notes , telemetry, EKGs, labs and examining patient as well as establishing an assessment and plan that was discussed with the patient.  > 50% of time was spent in direct patient care.    Thayer Headings, Brooke Bonito., MD, [facility] 01/03/2021, 12:39 PM [address]  [place] Pager [phone]

## 2021-01-03 NOTE — Care Management Important Message (Signed)
Important Message  Patient Details IM Letter given to the Patient. Name: Patricia [name] MRN: [id number] Date of Birth: 06/29/1950   Medicare Important Message Given:  Yes     Kerin Salen 01/03/2021, 3:31 PM

## 2021-01-03 NOTE — Evaluation (Signed)
Physical Therapy Evaluation Patient Details Name: Patricia [name] MRN: [id number] DOB: 11-Nov-1950 Today's Date: 01/03/2021   SATURATION QUALIFICATIONS: (This note is used to comply with regulatory documentation for home oxygen)  Patient Saturations on Room Air at Rest = 85%  Patient Saturations on Room Air while Ambulating = n/a  Patient Saturations on 2 Liters of oxygen while Ambulating = 88%    History of Present Illness  70 y.o. female admitted on 12/25/20 due to fatigue, SOB. 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  Pt with significant PMH of HTN, back surgery, ORIF radius and ulna fx R.  Clinical Impression  On eval, pt required Min A for mobility. She walked ~50 feet with a RW. Pt presents with general weakness, decreased activity tolerance, and impaired gait and balance. Encouraged pt to consider using RW for ambulation safety. Will plan to follow and progress activity as tolerated. PT recommendation is for HHPT f/u.        Recommendations for follow up therapy are one component of a multi-disciplinary discharge planning process, led by the attending physician.  Recommendations may be updated based on patient status, additional functional criteria and insurance authorization.  Follow Up Recommendations Home health PT    Assistance Recommended at Discharge Frequent or constant Supervision/Assistance  Functional Status Assessment Patient has had a recent decline in their functional status and demonstrates the ability to make significant improvements in function in a reasonable and predictable amount of time.   Equipment Recommendations  None recommended by PT    Recommendations for Other Services       Precautions / Restrictions Precautions Precautions: Fall Precaution Comments: monitor HR, O2 Restrictions Weight Bearing Restrictions: No      Mobility  Bed Mobility                General bed mobility comments: oob in recliner    Transfers Overall transfer level: Needs assistance Equipment used: Rolling walker (2 wheels) Transfers: Sit to/from Stand Sit to Stand: Min guard           General transfer comment: Min guard for safety. Cues for hand placement.    Ambulation/Gait Ambulation/Gait assistance: Min assist Gait Distance (Feet): 50 Feet Assistive device: Rolling walker (2 wheels) Gait Pattern/deviations: Step-through pattern;Decreased stride length       General Gait Details: Walked x 1 with RW-O2 88% on 2L, HR 96 bpm. 1 instance of LOB requiring Min A from therapist to stabilize. Fatigues fairly easily.  Stairs            Wheelchair Mobility    Modified Rankin (Stroke Patients Only)       Balance Overall balance assessment: Needs assistance         Standing balance support: Bilateral upper extremity supported;Reliant on assistive device for balance Standing balance-Leahy Scale: Poor                               Pertinent Vitals/Pain Pain Assessment: No/denies pain    Home Living Family/patient expects to be discharged to:: Private residence Living Arrangements: Spouse/significant other Available Help at Discharge: Family;Available 24 hours/day Type of Home: House Home Access: Stairs to enter Entrance Stairs-Rails: Psychiatric nurse of Steps: 3   Home Layout: One level Home Equipment: International aid/development worker (2 wheels);Hand held shower head Additional Comments: Motorized recliner without lift function.    Prior Function  Prior Level of Function : Independent/Modified Independent             Mobility Comments: Was ambulating without AD. ADLs Comments: Has been Independent with basic ADLs.  Pt shares chores with spouse.     Hand Dominance   Dominant Hand: Right    Extremity/Trunk Assessment   Upper Extremity Assessment Upper Extremity Assessment: Defer to  OT evaluation    Lower Extremity Assessment Lower Extremity Assessment: Generalized weakness    Cervical / Trunk Assessment Cervical / Trunk Assessment: Normal  Communication   Communication: No difficulties  Cognition Arousal/Alertness: Awake/alert Behavior During Therapy: WFL for tasks assessed/performed Overall Cognitive Status: Within Functional Limits for tasks assessed                                          General Comments      Exercises     Assessment/Plan    PT Assessment Patient needs continued PT services  PT Problem List Decreased strength;Decreased mobility;Decreased activity tolerance;Decreased balance;Decreased knowledge of use of DME       PT Treatment Interventions DME instruction;Gait training;Therapeutic activities;Therapeutic exercise;Patient/family education;Balance training;Functional mobility training    PT Goals (Current goals can be found in the Care Plan section)  Acute Rehab PT Goals Patient Stated Goal: home soon. regain strength PT Goal Formulation: With patient Time For Goal Achievement: 01/17/21 Potential to Achieve Goals: Good    Frequency Min 3X/week   Barriers to discharge        Co-evaluation               AM-PAC PT "6 Clicks" Mobility  Outcome Measure Help needed turning from your back to your side while in a flat bed without using bedrails?: A Little Help needed moving from lying on your back to sitting on the side of a flat bed without using bedrails?: A Little Help needed moving to and from a bed to a chair (including a wheelchair)?: A Little Help needed standing up from a chair using your arms (e.g., wheelchair or bedside chair)?: A Little Help needed to walk in hospital room?: A Little Help needed climbing 3-5 steps with a railing? : A Little 6 Click Score: 18    End of Session Equipment Utilized During Treatment: Gait belt;Oxygen Activity Tolerance: Patient tolerated treatment well Patient  left: in chair;with call bell/phone within reach   PT Visit Diagnosis: Muscle weakness (generalized) (M62.81);Difficulty in walking, not elsewhere classified (R26.2)    Time: 1010-1034 PT Time Calculation (min) (ACUTE ONLY): 24 min   Charges:   PT Evaluation $PT Eval Moderate Complexity: 1 Mod PT Treatments $Gait Training: 8-22 mins           Doreatha Massed, PT Acute Rehabilitation  Office: [phone] Pager: [phone]

## 2021-01-03 NOTE — Discharge Summary (Signed)
Physician Discharge Summary  Patricia [name] LNL:[id number] DOB: 1950/06/07 DOA: 12/25/2020  PCP: [name], MD  Admit date: 12/25/2020 Discharge date: 01/03/2021  Admitted From: Home  Discharge disposition: Home with home health  Recommendations for Outpatient Follow-Up:   Follow up with your primary care provider in one week.  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  Diet recommendation: Regular  Wound care: None.  Code status: DNR   History of Present Illness:   Patient is a 70 y.o. female with past medical history of hypertension, hyperlipidemia, atrial flutter scheduled for cardioversion next month, history of lung cancer October 2022 presented to hospital with fatigue and shortness of breath.  Patient was noted to be pancytopenic on initial exam.  Urinalysis was negative for infection.  Patient was noted to be in atrial fibrillation with RVR and cardiology was consulted.  Patient was then admitted hospital for further evaluation and treatment.    Hospital Course:   Following conditions were addressed during hospitalization as listed below,  Acute hypoxic respiratory failure in the setting of COPD and history of smoking: Continue supplemental oxygen on discharge.  Patient has completed 5-day course of Solu-Medrol.  CT scan with small bilateral pleural effusion and atelectasis.   Atrial fibrillation with RVR: Improved.  Seen by cardiology today.  Patient has been initiated on Cardizem 360 mg daily and  metoprolol 75 mg 3 times daily.  2D echocardiogram from 09/29/2020 showed LV ejection fraction of 60 to 65%.  Eliquis has been restarted from today since platelet count more than 50 K and patient is at high risk for thromboembolic events.  I spoke with Dr. Julien Nordmann oncology who is okay with restarting Eliquis starting today.  Flecanide was discontinued during hospitalization and will be discontinued on discharge until seen by cardiology    Primary Small cell lung cancer of left lung: S/p chemoradiation with cisplatin and etoposide . S/p 3 cycles.  Patient presented to hospital with pancytopenia requiring 1 unit of PRBC and platelets.  Hemoglobin of 10.8 at this time.  Platelet count at 58.  Patient received Granix injection during hospitalization.  Patient will follow-up with oncology as outpatient   Essential hypertension: Continue Cardizem and metoprolol.  Blood pressure seems to be stable.   Hyperlipidemia: Continue statin.   History of esophagitis: Patient.  Received Diflucan and Protonix during hospitalization.   Adjustment disorder: Continue Lexapro and Klonopin.   Genital HSV: Continue Valtrex for next 5 days to complete the course..   Chronic pain syndrome: Continue home medications with gabapentin and Norco.   Diarrhea> Resolved.  Patient takes linaclotide at home.   Elevated liver enzymes: Continue to monitor as outpatient    Anemia of chronic disease: Will need to monitor as outpatient.  Hemoglobin prior to discharge was 10.8.   Hypomagnesemia : Replaced.    Disposition.  At this time, patient is stable for disposition home with outpatient PCP oncology and cardiology follow-up.  Medical Consultants:   Cardiology Oncology  Procedures:    None Subjective:   Today, patient was seen  and examined at bedside.  Denies any chest pain, increasing shortness of breath, nausea or vomiting.  Feels better.  Wishes to go home.  Discharge Exam:   Vitals:   01/03/21 0907  01/03/21 1048  BP:    Pulse: 70   Resp:    Temp:    SpO2: 92% 92%   Vitals:   01/03/21 0524 01/03/21 0835 01/03/21 0907 01/03/21 1048  BP: 100/65     Pulse: 71  70   Resp: 19     Temp: 98.2 F (36.8 C)     TempSrc:      SpO2: 96% 96% 92% 92%  Weight:      Height:       General: Alert awake, not in obvious distress, chronically ill appearing. HENT: pupils equally reacting to light,  No scleral pallor or icterus noted. Oral mucosa is moist.  Chest:   Diminished breath sounds bilaterally. No crackles or wheezes.  CVS: S1 &S2 heard. No murmur.  Irregular rhythm Abdomen: Soft, nontender, nondistended.  Bowel sounds are heard.   Extremities: No cyanosis, clubbing or edema.  Peripheral pulses are palpable. Psych: Alert, awake and oriented, normal mood CNS:  No cranial nerve deficits.  Power equal in all extremities.   Skin: Warm and dry.  No rashes noted.  The results of significant diagnostics from this hospitalization (including imaging, microbiology, ancillary and laboratory) are listed below for reference.     Diagnostic Studies:   No results found.   Labs:   Basic Metabolic Panel: Recent Labs  Lab 12/29/20 0445 12/30/20 0511 01/01/21 0529 01/02/21 0441 01/03/21 0431  NA 133* 133* 133* 134* 131*  K 3.5 3.6 3.7 3.4* 3.7  CL 96* 96* 95* 93* 93*  CO2 30 29 32 34* 32  GLUCOSE 122* 111* 84 87 93  BUN 23 28* 26* 18 17  CREATININE 0.80 0.77 0.75 0.70 0.63  CALCIUM 8.6* 8.4* 8.3* 8.3* 7.9*  MG 1.6* 1.7 1.5* 1.6* 1.7  PHOS 3.5 3.6 4.4 4.2 4.2   GFR Estimated Creatinine Clearance: 74 mL/min (by C-G formula based on SCr of 0.63 mg/dL). Liver Function Tests: No results for input(s): AST, ALT, ALKPHOS, BILITOT, PROT, ALBUMIN in the last 168 hours. No results for input(s): LIPASE, AMYLASE in the last 168 hours. No results for input(s): AMMONIA in the last 168 hours. Coagulation profile No results for input(s): INR, PROTIME in the last 168 hours.  CBC: Recent Labs   Lab 12/28/20 0511 12/29/20 0445 12/30/20 0511 01/01/21 0529 01/02/21 0441 01/03/21 0431  WBC 3.0* 7.5 9.7 7.8 7.9 6.5  NEUTROABS 2.5 6.5  --   --   --   --   HGB 8.9* 9.3* 9.5* 10.3* 11.7* 10.8*  HCT 25.3* 27.0* 27.5* 30.2* 33.7* 31.6*  MCV 94.4 94.4 93.9 95.3 94.9 96.0  PLT 29* 33* 38* 52* 55* 58*   Cardiac Enzymes: No results for input(s): CKTOTAL, CKMB, CKMBINDEX, TROPONINI in the last 168 hours. BNP: Invalid input(s): POCBNP CBG: No results for input(s): GLUCAP in the last 168 hours. D-Dimer No results for input(s): DDIMER in the last 72 hours. Hgb A1c No results for input(s): HGBA1C in the last 72 hours. Lipid Profile No results for input(s): CHOL, HDL, LDLCALC, TRIG, CHOLHDL, LDLDIRECT in the last 72 hours. Thyroid function studies No results for input(s): TSH, T4TOTAL, T3FREE, THYROIDAB in the last 72 hours.  Invalid input(s): FREET3 Anemia work up No results for input(s): VITAMINB12, FOLATE, FERRITIN, TIBC, IRON, RETICCTPCT in the last 72 hours.  Microbiology Recent Results (from the past 240 hour(s))  Blood culture (routine x 2)     Status: None   Collection Time: 12/25/20 11:57 AM   Specimen: BLOOD  Result Value Ref Range Status   Specimen Description BLOOD LEFT ANTECUBITAL  Final   Special Requests   Final    BOTTLES DRAWN AEROBIC AND ANAEROBIC Blood Culture results may not be optimal due to an inadequate volume of blood received in culture bottles   Culture   Final    NO GROWTH 5 DAYS Performed at [facility], [place] [address] [place], [place] [address]    Report Status 12/30/2020 FINAL  Final  Resp Panel by RT-PCR (Flu A&B, Covid) Nasopharyngeal Swab     Status: None   Collection Time: 12/25/20  8:34 PM   Specimen: Nasopharyngeal Swab; Nasopharyngeal(NP) swabs in vial transport medium  Result Value Ref Range Status   SARS Coronavirus 2 by RT PCR NEGATIVE NEGATIVE Final    Comment: (NOTE) SARS-CoV-2 target nucleic acids are NOT DETECTED.  The  SARS-CoV-2 RNA is generally detectable in upper respiratory specimens during the acute phase of infection. The lowest concentration of SARS-CoV-2 viral copies this assay can detect is 138 copies/mL. A negative result does not preclude SARS-Cov-2 infection and should not be used as the sole basis for treatment or other patient management decisions. A negative result may occur with  improper specimen collection/handling, submission of specimen other than nasopharyngeal swab, presence of viral mutation(s) within the areas targeted by this assay, and inadequate number of viral copies(<138 copies/mL). A negative result must be combined with clinical observations, patient history, and epidemiological information. The expected result is Negative.  Fact Sheet for Patients:  EntrepreneurPulse.com.au  Fact Sheet for Healthcare Providers:  IncredibleEmployment.be  This test is no t yet approved or cleared by the [place] FDA and  has been authorized for detection and/or diagnosis of SARS-CoV-2 by FDA under an Emergency Use Authorization (EUA). This EUA will remain  in effect (meaning this test can be used) for the duration of the COVID-19 declaration under Section 564(b)(1) of the Act, 21 U.S.C.section 360bbb-3(b)(1), unless the authorization is terminated  or revoked sooner.       Influenza A by PCR NEGATIVE NEGATIVE Final   Influenza B by PCR NEGATIVE NEGATIVE Final    Comment: (NOTE) The Xpert Xpress SARS-CoV-2/FLU/RSV plus assay is intended as an aid in the diagnosis of influenza from Nasopharyngeal swab specimens and should not be used as a sole basis for treatment. Nasal washings and aspirates are unacceptable for Xpert Xpress SARS-CoV-2/FLU/RSV testing.  Fact Sheet for Patients: EntrepreneurPulse.com.au  Fact Sheet for Healthcare Providers: IncredibleEmployment.be  This test is not yet approved or  cleared by the [place] FDA and has been authorized for detection and/or diagnosis of SARS-CoV-2 by FDA under an Emergency Use Authorization (EUA). This EUA will remain in effect (meaning this test can be used) for the duration of the COVID-19 declaration under Section 564(b)(1) of the Act, 21 U.S.C. section 360bbb-3(b)(1), unless the authorization is terminated or revoked.  Performed at [facility], [place] [address] [place], [place] [address]   Blood culture (routine x 2)     Status: None   Collection Time: 12/27/20  3:26 PM   Specimen: BLOOD  Result Value Ref Range Status   Specimen Description   Final    BLOOD BLOOD RIGHT HAND Performed at [facility] [address] [place], [place] [address]  Special Requests   Final    BOTTLES DRAWN AEROBIC ONLY Blood Culture adequate volume Performed at [facility] [address] [place], [place] [address]    Culture   Final    NO GROWTH 5 DAYS Performed at [facility], [place] [address] [place], [place] [address]    Report Status 01/01/2021 FINAL  Final     Discharge Instructions:   Discharge Instructions     Call MD for:  persistant nausea and vomiting   Complete by: As directed    Call MD for:  temperature >100.4   Complete by: As directed    Discharge instructions   Complete by: As directed    Follow-up with your primary care provider in 1 week.  Follow-up with cardiology as scheduled by the clinic.  Follow-up with oncology Dr. Myles Gip as scheduled by the clinic.  Seek medical attention for worsening symptoms.  Please avoid overexertion.  Continue oxygen by nasal cannula.   Increase activity slowly   Complete by: As directed       Allergies as of 01/03/2021       Reactions   Augmentin [amoxicillin-pot Clavulanate] Nausea Only, Other (See Comments)   GI upset   Levaquin [levofloxacin In D5w] Other (See Comments)   Joint problems   Mom [magnesium Hydroxide]  Other (See Comments)   Welts   Oxycodone Other (See Comments)   Nausea   Other Itching   Paper tape        Medication List     STOP taking these medications    flecainide 100 MG tablet Commonly known as: TAMBOCOR   fluconazole 100 MG tablet Commonly known as: DIFLUCAN       TAKE these medications    acetaminophen 325 MG tablet Commonly known as: TYLENOL 1 tablet as needed.   budesonide-formoterol 80-4.5 MCG/ACT inhaler Commonly known as: Symbicort Take 2 puffs first thing in am and then another 2 puffs about 12 hours later.   carboxymethylcellulose 0.5 % Soln Commonly known as: REFRESH PLUS Place 1 drop into both eyes 3 (three) times daily as needed (for dryness).   carisoprodol 350 MG tablet Commonly known as: SOMA Take 350 mg by mouth 3 (three) times daily.   clobetasol ointment 0.05 % Commonly known as: TEMOVATE Apply 1 application topically See admin instructions. Apply to vaginal area daily as directed   clonazePAM 1 MG tablet Commonly known as: KLONOPIN Take 1 mg by mouth at bedtime.   diltiazem 360 MG 24 hr capsule Commonly known as: CARDIZEM CD Take 1 capsule (360 mg total) by mouth daily. Start taking on: January 04, 2021   docusate sodium 100 MG capsule Commonly known as: COLACE Take 1 capsule (100 mg total) by mouth 2 (two) times daily.   Eliquis 5 MG Tabs tablet Generic drug: apixaban TAKE 1 TABLET(5 MG) BY MOUTH TWICE DAILY What changed: See the new instructions.   escitalopram 10 MG tablet Commonly known as: LEXAPRO Take 1 tablet (10 mg total) by mouth daily. Start taking on: January 04, 2021   fluticasone 50 MCG/ACT nasal spray Commonly known as: FLONASE Place 2 sprays into both nostrils daily as needed for allergies or rhinitis.   furosemide 20 MG tablet Commonly known as: LASIX Take 1 tablet (20 mg total) by mouth daily.   gabapentin 600 MG tablet Commonly known as: NEURONTIN Take 600 mg by mouth See admin instructions.  Take 600 mg by mouth in the morning and at lunchtime  guaiFENesin-dextromethorphan 100-10 MG/5ML syrup Commonly known as: ROBITUSSIN DM Take 10 mLs by mouth every 4 (four) hours as needed for cough.   HYDROcodone-acetaminophen 7.5-325 MG tablet Commonly known as: Norco Take 1 tablet by mouth every 6 (six) hours as needed for severe pain.   lidocaine 2 % solution Commonly known as: XYLOCAINE Use as directed 15 mLs in the mouth or throat every 6 (six) hours as needed for mouth pain.   linaclotide 145 MCG Caps capsule Commonly known as: LINZESS Take 1 capsule (145 mcg total) by mouth daily at 4 PM.   magic mouthwash Soln Take 5 mLs by mouth 4 (four) times daily as needed for mouth pain. Pt is allergic to Magnesium   magnesium oxide 400 (240 Mg) MG tablet Commonly known as: MAG-OX Take 1 tablet (400 mg total) by mouth daily.   Metoprolol Tartrate 75 MG Tabs Take 75 mg by mouth every 8 (eight) hours. What changed:  medication strength how much to take when to take this   nystatin 100000 UNIT/ML suspension Commonly known as: MYCOSTATIN Take 5 mLs (500,000 Units total) by mouth 4 (four) times daily.   omeprazole 20 MG capsule Commonly known as: PRILOSEC Take 20 mg by mouth daily before breakfast.   ondansetron 8 MG disintegrating tablet Commonly known as: ZOFRAN-ODT Take 8 mg by mouth 3 (three) times daily.   polyethylene glycol 17 g packet Commonly known as: MIRALAX / GLYCOLAX Take 34 g by mouth in the morning.   prochlorperazine 10 MG tablet Commonly known as: COMPAZINE Take 1 tablet (10 mg total) by mouth every 6 (six) hours as needed for nausea or vomiting.   rosuvastatin 5 MG tablet Commonly known as: CRESTOR Take 5 mg by mouth 3 (three) times a week. Mondays Wednesdays Fridays   sodium chloride 1 g tablet Take 1 g by mouth 3 (three) times daily.   Tiotropium Bromide-Olodaterol 2.5-2.5 MCG/ACT Aers Inhale 2 puffs into the lungs daily.   traMADol 50 MG  tablet Commonly known as: ULTRAM Take 100 mg by mouth in the morning, at noon, and at bedtime.   valACYclovir 1000 MG tablet Commonly known as: VALTREX Take 1 tablet (1,000 mg total) by mouth 2 (two) times daily for 5 days.   Ventolin HFA 108 (90 Base) MCG/ACT inhaler Generic drug: albuterol Inhale 2 puffs into the lungs every 6 (six) hours as needed for wheezing or shortness of breath.   Vitamin D3 50 MCG (2000 UT) Tabs Take 2,000 Units by mouth daily.   zaleplon 10 MG capsule Commonly known as: SONATA Take 10 mg by mouth at bedtime as needed (for interrupted sleep).               Durable Medical Equipment  (From admission, onward)           Start     Ordered   01/03/21 1143  For home use only DME oxygen  Once       Question Answer Comment  Length of Need 12 Months   Mode or (Route) Nasal cannula   Liters per Minute 2   Frequency Continuous (stationary and portable oxygen unit needed)   Oxygen conserving device Yes   Oxygen delivery system Gas      01/03/21 1142            Follow-up Information     [name], MD Follow up in 1 week(s).   Specialty: Family Medicine Contact information: [place] [address] [phone]  Constance Haw, MD .   Specialty: Cardiology Contact information: [place] [address] [phone]         Constance Haw, MD .   Specialty: Cardiology Contact information: [place] [address] [phone]                  Time coordinating discharge: 39 minutes  Signed:  Cortne Amara  [facility] 01/03/2021, 1:14 PM

## 2021-01-04 ENCOUNTER — Ambulatory Visit: Payer: Medicare HMO

## 2021-01-04 ENCOUNTER — Ambulatory Visit
Admission: RE | Admit: 2021-01-04 | Discharge: 2021-01-04 | Disposition: A | Discharge status: Home / Self Care | Payer: Medicare HMO | Source: Ambulatory Visit | Attending: Radiation Oncology | Admitting: Radiation Oncology

## 2021-01-04 ENCOUNTER — Other Ambulatory Visit: Payer: Self-pay

## 2021-01-04 ENCOUNTER — Telehealth: Payer: Self-pay | Admitting: Internal Medicine

## 2021-01-04 ENCOUNTER — Telehealth: Payer: Self-pay | Admitting: Physician Assistant

## 2021-01-04 DIAGNOSIS — Z510 Encounter for antineoplastic radiation therapy: Secondary | ICD-10-CM | POA: Diagnosis not present

## 2021-01-04 DIAGNOSIS — T451X5A Adverse effect of antineoplastic and immunosuppressive drugs, initial encounter: Secondary | ICD-10-CM | POA: Diagnosis not present

## 2021-01-04 DIAGNOSIS — Z79899 Other long term (current) drug therapy: Secondary | ICD-10-CM | POA: Diagnosis not present

## 2021-01-04 DIAGNOSIS — D701 Agranulocytosis secondary to cancer chemotherapy: Secondary | ICD-10-CM | POA: Diagnosis not present

## 2021-01-04 DIAGNOSIS — C3432 Malignant neoplasm of lower lobe, left bronchus or lung: Secondary | ICD-10-CM | POA: Diagnosis not present

## 2021-01-04 DIAGNOSIS — Z5111 Encounter for antineoplastic chemotherapy: Secondary | ICD-10-CM | POA: Diagnosis not present

## 2021-01-04 DIAGNOSIS — E876 Hypokalemia: Secondary | ICD-10-CM | POA: Diagnosis not present

## 2021-01-04 DIAGNOSIS — Z5189 Encounter for other specified aftercare: Secondary | ICD-10-CM | POA: Diagnosis not present

## 2021-01-04 DIAGNOSIS — C771 Secondary and unspecified malignant neoplasm of intrathoracic lymph nodes: Secondary | ICD-10-CM | POA: Diagnosis not present

## 2021-01-04 DIAGNOSIS — D649 Anemia, unspecified: Secondary | ICD-10-CM | POA: Diagnosis not present

## 2021-01-04 NOTE — Telephone Encounter (Signed)
Sch per 12/28 secure caht, left msg

## 2021-01-04 NOTE — Telephone Encounter (Signed)
New Message:     Patient is scheduled to see Renee on Friday(01-06-21). She wants to know if she can have a Virtual Visit? She was just discharged from the hospital and jis very weak.

## 2021-01-05 ENCOUNTER — Ambulatory Visit: Payer: Medicare HMO

## 2021-01-05 ENCOUNTER — Ambulatory Visit
Admission: RE | Admit: 2021-01-05 | Discharge: 2021-01-05 | Disposition: A | Discharge status: Home / Self Care | Payer: Medicare HMO | Source: Ambulatory Visit | Attending: Radiation Oncology | Admitting: Radiation Oncology

## 2021-01-05 DIAGNOSIS — G894 Chronic pain syndrome: Secondary | ICD-10-CM | POA: Diagnosis not present

## 2021-01-05 DIAGNOSIS — Z5111 Encounter for antineoplastic chemotherapy: Secondary | ICD-10-CM | POA: Diagnosis not present

## 2021-01-05 DIAGNOSIS — C3492 Malignant neoplasm of unspecified part of left bronchus or lung: Secondary | ICD-10-CM | POA: Diagnosis not present

## 2021-01-05 DIAGNOSIS — D649 Anemia, unspecified: Secondary | ICD-10-CM | POA: Diagnosis not present

## 2021-01-05 DIAGNOSIS — D638 Anemia in other chronic diseases classified elsewhere: Secondary | ICD-10-CM | POA: Diagnosis not present

## 2021-01-05 DIAGNOSIS — I10 Essential (primary) hypertension: Secondary | ICD-10-CM | POA: Diagnosis not present

## 2021-01-05 DIAGNOSIS — C771 Secondary and unspecified malignant neoplasm of intrathoracic lymph nodes: Secondary | ICD-10-CM | POA: Diagnosis not present

## 2021-01-05 DIAGNOSIS — Z09 Encounter for follow-up examination after completed treatment for conditions other than malignant neoplasm: Secondary | ICD-10-CM | POA: Diagnosis not present

## 2021-01-05 DIAGNOSIS — A6000 Herpesviral infection of urogenital system, unspecified: Secondary | ICD-10-CM | POA: Diagnosis not present

## 2021-01-05 DIAGNOSIS — C3432 Malignant neoplasm of lower lobe, left bronchus or lung: Secondary | ICD-10-CM | POA: Diagnosis not present

## 2021-01-05 DIAGNOSIS — I480 Paroxysmal atrial fibrillation: Secondary | ICD-10-CM | POA: Diagnosis not present

## 2021-01-05 DIAGNOSIS — F4320 Adjustment disorder, unspecified: Secondary | ICD-10-CM | POA: Diagnosis not present

## 2021-01-05 DIAGNOSIS — Z79899 Other long term (current) drug therapy: Secondary | ICD-10-CM | POA: Diagnosis not present

## 2021-01-05 DIAGNOSIS — E876 Hypokalemia: Secondary | ICD-10-CM | POA: Diagnosis not present

## 2021-01-05 DIAGNOSIS — K219 Gastro-esophageal reflux disease without esophagitis: Secondary | ICD-10-CM | POA: Diagnosis not present

## 2021-01-05 DIAGNOSIS — Z510 Encounter for antineoplastic radiation therapy: Secondary | ICD-10-CM | POA: Diagnosis not present

## 2021-01-05 DIAGNOSIS — D701 Agranulocytosis secondary to cancer chemotherapy: Secondary | ICD-10-CM | POA: Diagnosis not present

## 2021-01-05 NOTE — Progress Notes (Signed)
Virtual Visit via Telephone Note   This visit type was conducted due to national recommendations for restrictions regarding the COVID-19 Pandemic (e.g. social distancing) in an effort to limit this patient's exposure and mitigate transmission in our community.  Due to her co-morbid illnesses, this patient is at least at moderate risk for complications without adequate follow up.  This format is felt to be most appropriate for this patient at this time.  The patient did not have access to video technology/had technical difficulties with video requiring transitioning to audio format only (telephone).  All issues noted in this document were discussed and addressed.  No physical exam could be performed with this format.  Please refer to the patient's chart for her  consent to telehealth [facility].  The patient was identified using 2 identifiers.   She is unable to get video for today's visit      Cardiology Office Note Date:  01/06/2021  Patient ID:  Patricia [name], Patricia [name] April 22, 1950, MRN [id number] PCP:  [name], MD  Cardiologist/Electrophysiologist: Dr. Curt Bears    Chief Complaint: post ER visit  History of Present Illness: Patricia [name] is a 70 y.o. female with history of HTN, HLD, TIA, IBS, SVT, AFib, chronic back pain, lung Ca (non-operable)  She was referred to Dr. Curt Bears June 2020 for an episode of SVT.  She had worn a monitor noting SVT, no AFib (pending final report), planned to discuss case with neurology perhaps consider linq.  The patient wanted to avoid medicines/procedures, was instructed on vagal maneuvers. Final monitor report: Zero atrial fibrillation Sinus rhythm with episodes of paroxysmal SVT   Nov 2021, called with palpitations recommended 2 week monitoring, mentioned issues with pain as well and planned to f/u with her surgeon, thinking perhaps triggered by pain. 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 Date of surgery November 20, 2019)   10/01/20 hospitalized with syncope, she was ill with recurrent loose stools for days more then her usual, she was hyponatremic, orthostatic vitals inconsistent. She did have SVT (probably an ATach) though syncope felt 2/2 hypovolemia. 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  She had a biopsy of her cancer at that time.  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  She has been since been started on chemotherapy  She saw Dr. Curt Bears in f/u Oct 27/22, had some brief/mild palpitations, tolerating chemo, no changes were made.  ER visit 11/11/20 with palpitations, tachycardia, EKG was ST 104bpm, was hypokalemic (3.4) (noted by her oncologist as well) and low mag (1.2), started on K+ supplementation, developed Afib w/RVR and given IV lopressor x2 and discharged from the ER back in SR  Saw Dr. Valeta Harms 11/16/20, noted she was planned for XRT with oncology.  Suspected component of COPD and started on maintenance inhaler  Called 11/11 with reports of elevated HRs given an appt 11/29  I saw her 12/06/20 She has had several episodes of racing heart rates associated with marked weakness and near syncope.  She had one this AM, they last 20-30 min, she has to lay or sit down immediately She denies CP, has not fainted She in general has had a  mild increase in her SOB with the start of her chemo/XRT. When not in RVR/her tachycardia she denies any near syncope or syncope. No obvious bleeding,  CBC/counts drifting downward, no one has asked her to stop her Eliquis She has been started on something she says to help with her neutrophils In d/w Dr. Curt Bears, started on flecainide 50mg  BID  12/18/20 ER visit with painful swallowing found with esophagitis She was in AFib w/RVR 110's-130's,, unaware from a symptom perspective, given a couple  dosed of metoprolol with lottle affect, pt declined admisison  I saw her 12/20/20 Chemo and XRT are starting to take their toll on her, she generally feels poorly, tired all of the time. Her throat hurts, says then they gave her the dilaudid in the ER was the first time she has felt OK in several weeks. She has not missed any Eliquis doses for sure She is not certain that she has been in Af the whole time, her B suff tells her her HR is in the 60's when ehs checks it, despite having the sense her HR is beating fast No CP No syncope Sunday she was in so much pain and felt so bad though she thought she might faint. Gets winded with exertion, but not any worse then it has been. No rest SOB She did not want to go to the hospital, her flecainide was increased, planned for f/u EKG and DCCV if needed.  She was admitted 12/26/20 with worsening esophageal pain (2/esophagitis felt 2/2 XRT), progressive weakness and SOB She was in SR at time of admission though after resp txs reverted to Afib 2/RVR Given her significant anemia/pancytopenia her a/c was stopped and therefore her AAD stopped with efforts towards rate control She transferred to [facility] to continue her XRT and cardiology followed her there. Eventually seems HR did improve some and cardiology planned for her to keep close follow up though pt requested virtual visit. Discharged on 01/03/21 Dilt 360mg  daily, metoprolol 75mg  TID Discharge  WBC 0.6 >> 6.5 H/H 7.4/21 >>> 10.8/31 Plts 16 >>> 58  TODAY She is so happy to be home Still pretty tired and weak Sent home with O2, using a walker Looking forward to her last chemo/XRT that is next week!  She is only aware of her Afib when it is racing fast, she has not had any CP, palpitations or cardiac awareness since home Checks her BP and HR about 3x a day and both have been steady about like they are today No near syncope or syncope.  SOB is better then it was initially, using O2, and  nebulizer  She was resumed on Eliquis at time of discharge Has labs Tuesday, sees oncology MD Wed and has XRT Friday/chemo Saturday   Past Medical History:  Diagnosis Date   Anxiety    GERD (gastroesophageal reflux disease)    Hyperlipidemia    Hypertension    IBS (irritable bowel syndrome)    Insomnia    Low back pain    Lung cancer ([place]) 10/2020   TIA (transient ischemic attack) 10/2017   no deficits   Vitamin B12 deficiency    Vitamin D deficiency     Past Surgical History:  Procedure Laterality Date   BACK SURGERY     BREAST EXCISIONAL BIOPSY Left 1997   benign   BRONCHIAL BIOPSY  10/18/2020   Procedure: BRONCHIAL BIOPSIES;  Surgeon: [name], DO;  Location: [facility];  Service: Pulmonary;;   BRONCHIAL BRUSHINGS  10/18/2020   Procedure: BRONCHIAL BRUSHINGS;  Surgeon: [name], DO;  Location: [place];  Service: Pulmonary;;   BRONCHIAL NEEDLE ASPIRATION BIOPSY  10/18/2020  Procedure: BRONCHIAL NEEDLE ASPIRATION BIOPSIES;  Surgeon: [name], DO;  Location: [place] [facility];  Service: Pulmonary;;   BRONCHIAL WASHINGS  10/18/2020   Procedure: BRONCHIAL WASHINGS;  Surgeon: [name], DO;  Location: [place] [facility];  Service: Pulmonary;;   hemorrhoidecotmy     HERNIA MESH REMOVAL     OPEN REDUCTION INTERNAL FIXATION (ORIF) DISTAL RADIAL FRACTURE Right 11/19/2019   Procedure: OPEN REDUCTION INTERNAL FIXATION (ORIF) DISTAL RADIUS AND ULNA FRACTURE WITH REPAIR AS NECESSARY;  Surgeon: Roseanne Kaufman, MD;  Location: [place];  Service: Orthopedics;  Laterality: Right;  2 hrs Block with IV sedation   ORIF RADIUS & ULNA FRACTURES     TONSILLECTOMY     VIDEO BRONCHOSCOPY WITH ENDOBRONCHIAL NAVIGATION Left 10/18/2020   Procedure: VIDEO BRONCHOSCOPY WITH ENDOBRONCHIAL NAVIGATION;  Surgeon: [name], DO;  Location: [place];  Service: Pulmonary;  Laterality: Left;  ION   VIDEO BRONCHOSCOPY WITH ENDOBRONCHIAL ULTRASOUND Bilateral 10/18/2020    Procedure: VIDEO BRONCHOSCOPY WITH ENDOBRONCHIAL ULTRASOUND;  Surgeon: [name], DO;  Location: [place];  Service: Pulmonary;  Laterality: Bilateral;   VIDEO BRONCHOSCOPY WITH RADIAL ENDOBRONCHIAL ULTRASOUND  10/18/2020   Procedure: VIDEO BRONCHOSCOPY WITH RADIAL ENDOBRONCHIAL ULTRASOUND;  Surgeon: [name], DO;  Location: [place] [facility];  Service: Pulmonary;;    Current Outpatient Medications  Medication Sig Dispense Refill   carboxymethylcellulose (REFRESH PLUS) 0.5 % SOLN Place 1 drop into both eyes 3 (three) times daily as needed (for dryness).      carisoprodol (SOMA) 350 MG tablet Take 350 mg by mouth 3 (three) times daily.   5   Cholecalciferol (VITAMIN D3) 50 MCG (2000 UT) TABS Take 2,000 Units by mouth daily.     clobetasol ointment (TEMOVATE) 5.72 % Apply 1 application topically See admin instructions. Apply to vaginal area daily as directed     clonazePAM (KLONOPIN) 1 MG tablet Take 1 mg by mouth at bedtime.   3   docusate sodium (COLACE) 100 MG capsule Take 1 capsule (100 mg total) by mouth 2 (two) times daily. (Patient taking differently: Take 100 mg by mouth as needed for mild constipation or moderate constipation.) 10 capsule 0   ELIQUIS 5 MG TABS tablet TAKE 1 TABLET(5 MG) BY MOUTH TWICE DAILY 60 tablet 6   escitalopram (LEXAPRO) 10 MG tablet Take 1 tablet (10 mg total) by mouth daily. 30 tablet 2   fluticasone (FLONASE) 50 MCG/ACT nasal spray Place 2 sprays into both nostrils daily as needed for allergies or rhinitis.   5   furosemide (LASIX) 20 MG tablet Take 1 tablet (20 mg total) by mouth daily. 30 tablet 2   gabapentin (NEURONTIN) 600 MG tablet Take 600 mg by mouth See admin instructions. Take 600 mg by mouth in the morning and at lunchtime     guaiFENesin-dextromethorphan (ROBITUSSIN DM) 100-10 MG/5ML syrup Take 10 mLs by mouth every 4 (four) hours as needed for cough. 118 mL 0   HYDROcodone-acetaminophen (NORCO) 7.5-325 MG tablet Take 1 tablet by mouth  every 6 (six) hours as needed for severe pain. 15 tablet 0   ipratropium (ATROVENT) 0.02 % nebulizer solution 3 (three) times daily.     levalbuterol (XOPENEX) 0.63 MG/3ML nebulizer solution 3 (three) times daily.     lidocaine (XYLOCAINE) 2 % solution Use as directed 15 mLs in the mouth or throat every 6 (six) hours as needed for mouth pain. 100 mL 0   linaclotide (LINZESS) 145 MCG CAPS capsule Take 1 capsule (145 mcg total)  by mouth daily at 4 PM. (Patient taking differently: Take 145 mcg by mouth as needed.) 30 capsule 0   magnesium oxide (MAG-OX) 400 (240 Mg) MG tablet Take 1 tablet (400 mg total) by mouth daily. 30 tablet 1   nystatin (MYCOSTATIN) 100000 UNIT/ML suspension Take 5 mLs (500,000 Units total) by mouth 4 (four) times daily. 440 mL 5   omeprazole (PRILOSEC) 20 MG capsule Take 20 mg by mouth daily before breakfast.      ondansetron (ZOFRAN-ODT) 8 MG disintegrating tablet Take 8 mg by mouth as needed.     polyethylene glycol (MIRALAX / GLYCOLAX) packet Take 34 g by mouth in the morning.      prochlorperazine (COMPAZINE) 10 MG tablet Take 1 tablet (10 mg total) by mouth every 6 (six) hours as needed for nausea or vomiting. 30 tablet 0   rosuvastatin (CRESTOR) 5 MG tablet Take 5 mg by mouth 3 (three) times a week. Mondays Wednesdays Fridays     sodium chloride 1 g tablet Take 1 g by mouth 3 (three) times daily.     traMADol (ULTRAM) 50 MG tablet Take 100 mg by mouth in the morning, at noon, and at bedtime.     valACYclovir (VALTREX) 1000 MG tablet Take 1 tablet (1,000 mg total) by mouth 2 (two) times daily for 5 days. 10 tablet 0   zaleplon (SONATA) 10 MG capsule Take 10 mg by mouth at bedtime as needed (for interrupted sleep).      diltiazem (CARDIZEM CD) 360 MG 24 hr capsule Take 1 capsule (360 mg total) by mouth daily. 90 capsule 2   magic mouthwash SOLN Take 5 mLs by mouth 4 (four) times daily as needed for mouth pain. Pt is allergic to Magnesium (Patient not taking: Reported on  12/25/2020) 240 mL 1   Metoprolol Tartrate 75 MG TABS Take 75 mg by mouth every 8 (eight) hours. 90 tablet 2   No current facility-administered medications for this visit.    Allergies:   Augmentin [amoxicillin-pot clavulanate], Levaquin [levofloxacin in d5w], Mom [magnesium hydroxide], Oxycodone, and Other   Social History:  The patient  reports that she quit smoking about 12 months ago. Her smoking use included cigarettes. She started smoking about 51 years ago. She has a 50.00 pack-year smoking history. She has never used smokeless tobacco. She reports that she does not currently use alcohol. She reports that she does not use drugs.   Family History:  The patient's family history includes Breast cancer in her maternal aunt, paternal aunt, and sister.  ROS:  Please see the history of present illness.    All other systems are reviewed and otherwise negative.   PHYSICAL EXAM:  VS:  BP 107/67    Pulse 77    Ht 5\' 6"  (1.676 m)    Wt 186 lb (84.4 kg)    LMP  (LMP Unknown)    SpO2 92%    BMI 30.02 kg/m  BMI: Body mass index is 30.02 kg/m. Her voise is steady, strong.  She does not sound SOB or in any distress, pace and tone of voice are normal     EKG:  not done   10/01/20: TTE  1. Left ventricular ejection fraction, by estimation, is 60 to 65%. The  left ventricle has normal function. The left ventricle has no regional  wall motion abnormalities. Left ventricular diastolic parameters are  consistent with Grade I diastolic dysfunction (impaired relaxation).   2. Right ventricular systolic function is normal. The  right ventricular  size is normal.   3. Prominent epicardial fat.   4. The mitral valve is normal in structure. No evidence of mitral valve  regurgitation. No evidence of mitral stenosis.   5. The aortic valve was not well visualized. There is mild calcification  of the aortic valve. Aortic valve regurgitation is not visualized. Mild  aortic valve sclerosis is present, with  no evidence of aortic valve  stenosis.   6. The inferior vena cava is normal in size with <50% respiratory  variability, suggesting right atrial pressure of 8 mmHg.    2 weeks event monitor 12/08/19: Max 234 bpm 02:47pm, 12/01 Min 46 bpm 11:11pm, 12/07 Avg 76 bpm Less than 1% ventricular and supraventricular ectopy The predominant underlying rhythm was sinus rhythm 2% atrial fibrillation burden with heart rates of 107 to 234 bpm Triggered events associated with both atrial fibrillation and SVT.   TTE 10/25/17 - Left ventricle: The cavity size was normal. Systolic function was   normal. The estimated ejection fraction was in the range of 55%   to 60%. Wall motion was normal; there were no regional wall   motion abnormalities. Doppler parameters are consistent with   abnormal left ventricular relaxation (grade 1 diastolic   dysfunction). There was no evidence of elevated ventricular   filling pressure by Doppler parameters. - Aortic valve: There was no regurgitation. - Mitral valve: There was trivial regurgitation. - Right ventricle: The cavity size was normal. Wall thickness was   normal. Systolic function was normal. - Tricuspid valve: There was mild regurgitation. - Pulmonary arteries: Systolic pressure was within the normal   range. - Inferior vena cava: The vessel was normal in size. - Pericardium, extracardiac: There was no pericardial effusion.   Cardiac monitor 06/12/2018  Sinus rhythm with episodes of SVT  Recent Labs: 10/14/2020: TSH 2.021 12/25/2020: ALT 54 01/03/2021: BUN 17; Creatinine, Ser 0.63; Hemoglobin 10.8; Magnesium 1.7; Platelets 58; Potassium 3.7; Sodium 131  No results found for requested labs within last 8760 hours.   Estimated Creatinine Clearance: 70.6 mL/min (by C-G formula based on SCr of 0.63 mg/dL).   Wt Readings from Last 3 Encounters:  01/06/21 186 lb (84.4 kg)  12/26/20 204 lb 9.4 oz (92.8 kg)  12/23/20 189 lb 4.8 oz (85.9 kg)     Other  studies reviewed: Additional studies/records reviewed today include: summarized above  ASSESSMENT AND PLAN:  1. SVT Likely an ATach 2. Paroxysmal AFib 3. Aflutter CHA2DS2Vasc is 5,  Off a/c 2/2 pancytopenia, anemia Off AAD/flecainide and planned for rate control strategy until or if she is able to resume a/c. She was resumed on Eliquis HRs home 7's or so by her home machine She is planned for [facility] though not yet started  She mentions she usually feels pretty awful for a week or so after treatments, we will plan to see her back in 3-4 weeks she will let us know if HRs rise, sustains >110  2. HTN     Looks ok  Time spent 10 minutes   Disposition: as above   Current medicines are reviewed at length with the patient today.  The patient did not have any concerns regarding medicines.  Patricia Night, PA-C 01/06/2021 1:33 PM     [place] [address] [place] [address] [phone] (office)  [phone] (fax)

## 2021-01-06 ENCOUNTER — Ambulatory Visit
Admission: RE | Admit: 2021-01-06 | Discharge: 2021-01-06 | Disposition: A | Discharge status: Home / Self Care | Payer: Medicare HMO | Source: Ambulatory Visit | Attending: Radiation Oncology | Admitting: Radiation Oncology

## 2021-01-06 ENCOUNTER — Encounter: Payer: Self-pay | Admitting: Physician Assistant

## 2021-01-06 ENCOUNTER — Other Ambulatory Visit: Payer: Self-pay

## 2021-01-06 ENCOUNTER — Telehealth (INDEPENDENT_AMBULATORY_CARE_PROVIDER_SITE_OTHER): Payer: Medicare HMO | Admitting: Physician Assistant

## 2021-01-06 VITALS — BP 107/67 | HR 77 | Ht 66.0 in | Wt 186.0 lb

## 2021-01-06 DIAGNOSIS — I480 Paroxysmal atrial fibrillation: Secondary | ICD-10-CM | POA: Diagnosis not present

## 2021-01-06 DIAGNOSIS — I471 Supraventricular tachycardia: Secondary | ICD-10-CM

## 2021-01-06 DIAGNOSIS — Z79899 Other long term (current) drug therapy: Secondary | ICD-10-CM | POA: Diagnosis not present

## 2021-01-06 DIAGNOSIS — C3432 Malignant neoplasm of lower lobe, left bronchus or lung: Secondary | ICD-10-CM | POA: Diagnosis not present

## 2021-01-06 DIAGNOSIS — E876 Hypokalemia: Secondary | ICD-10-CM | POA: Diagnosis not present

## 2021-01-06 DIAGNOSIS — Z510 Encounter for antineoplastic radiation therapy: Secondary | ICD-10-CM | POA: Diagnosis not present

## 2021-01-06 DIAGNOSIS — D701 Agranulocytosis secondary to cancer chemotherapy: Secondary | ICD-10-CM | POA: Diagnosis not present

## 2021-01-06 DIAGNOSIS — D649 Anemia, unspecified: Secondary | ICD-10-CM | POA: Diagnosis not present

## 2021-01-06 DIAGNOSIS — C771 Secondary and unspecified malignant neoplasm of intrathoracic lymph nodes: Secondary | ICD-10-CM | POA: Diagnosis not present

## 2021-01-06 DIAGNOSIS — Z5111 Encounter for antineoplastic chemotherapy: Secondary | ICD-10-CM | POA: Diagnosis not present

## 2021-01-06 MED ORDER — DILTIAZEM HCL ER COATED BEADS 360 MG PO CP24: 360.0000 mg | ORAL_CAPSULE | Freq: Every day | ORAL | 2 refills | Status: DC

## 2021-01-06 MED ORDER — METOPROLOL TARTRATE 75 MG PO TABS: 75.0000 mg | ORAL_TABLET | Freq: Three times a day (TID) | ORAL | 2 refills | Status: DC

## 2021-01-06 NOTE — Patient Instructions (Addendum)
Medication Instructions:   Your physician recommends that you continue on your current medications as directed. Please refer to the Current Medication list given to you today.  *If you need a refill on your cardiac medications before your next appointment, please call your pharmacy*   Lab Work: [place]   If you have labs (blood work) drawn today and your tests are completely normal, you will receive your results only by: [place] (if you have MyChart) OR A paper copy in the mail If you have any lab test that is abnormal or we need to change your treatment, we will call you to review the results.   Testing/Procedures: NONE ORDERED  TODAY     Follow-Up: At [facility], you and your health needs are our priority.  As part of our continuing mission to provide you with exceptional heart care, we have created designated Provider Care Teams.  These Care Teams include your primary Cardiologist (physician) and Advanced Practice Providers (APPs -  Physician Assistants and Nurse Practitioners) who all work together to provide you with the care you need, when you need it.  We recommend signing up for the patient portal called "MyChart".  Sign up information is provided on this After Visit Summary.  MyChart is used to connect with patients for Virtual Visits (Telemedicine).  Patients are able to view lab/test results, encounter notes, upcoming appointments, etc.  Non-urgent messages can be sent to your provider as well.   To learn more about what you can do with MyChart, go to [email or web address].    Your next appointment:   3 -4 week(s)  The format for your next appointment:   In Person  Provider:   Allegra Lai, MD    Other Instructions

## 2021-01-09 DIAGNOSIS — D701 Agranulocytosis secondary to cancer chemotherapy: Secondary | ICD-10-CM | POA: Diagnosis not present

## 2021-01-09 DIAGNOSIS — I10 Essential (primary) hypertension: Secondary | ICD-10-CM | POA: Diagnosis not present

## 2021-01-09 DIAGNOSIS — I480 Paroxysmal atrial fibrillation: Secondary | ICD-10-CM | POA: Diagnosis not present

## 2021-01-09 DIAGNOSIS — D61810 Antineoplastic chemotherapy induced pancytopenia: Secondary | ICD-10-CM | POA: Diagnosis not present

## 2021-01-09 DIAGNOSIS — J9601 Acute respiratory failure with hypoxia: Secondary | ICD-10-CM | POA: Diagnosis not present

## 2021-01-09 DIAGNOSIS — J449 Chronic obstructive pulmonary disease, unspecified: Secondary | ICD-10-CM | POA: Diagnosis not present

## 2021-01-09 DIAGNOSIS — D630 Anemia in neoplastic disease: Secondary | ICD-10-CM | POA: Diagnosis not present

## 2021-01-09 DIAGNOSIS — C3432 Malignant neoplasm of lower lobe, left bronchus or lung: Secondary | ICD-10-CM | POA: Diagnosis not present

## 2021-01-09 DIAGNOSIS — I4892 Unspecified atrial flutter: Secondary | ICD-10-CM | POA: Diagnosis not present

## 2021-01-10 ENCOUNTER — Inpatient Hospital Stay: Payer: Medicare HMO | Attending: Internal Medicine | Admitting: Internal Medicine

## 2021-01-10 ENCOUNTER — Ambulatory Visit: Payer: Medicare HMO

## 2021-01-10 ENCOUNTER — Ambulatory Visit
Admission: RE | Admit: 2021-01-10 | Discharge: 2021-01-10 | Disposition: A | Discharge status: Home / Self Care | Payer: Medicare HMO | Source: Ambulatory Visit | Attending: Radiation Oncology | Admitting: Radiation Oncology

## 2021-01-10 ENCOUNTER — Other Ambulatory Visit: Payer: Self-pay

## 2021-01-10 ENCOUNTER — Inpatient Hospital Stay: Payer: Medicare HMO

## 2021-01-10 VITALS — BP 101/60 | HR 63 | Temp 98.2°F | Resp 19 | Ht 66.0 in | Wt 187.1 lb

## 2021-01-10 DIAGNOSIS — C771 Secondary and unspecified malignant neoplasm of intrathoracic lymph nodes: Secondary | ICD-10-CM | POA: Insufficient documentation

## 2021-01-10 DIAGNOSIS — C3432 Malignant neoplasm of lower lobe, left bronchus or lung: Secondary | ICD-10-CM | POA: Diagnosis not present

## 2021-01-10 DIAGNOSIS — Z5189 Encounter for other specified aftercare: Secondary | ICD-10-CM | POA: Diagnosis not present

## 2021-01-10 DIAGNOSIS — R911 Solitary pulmonary nodule: Secondary | ICD-10-CM

## 2021-01-10 DIAGNOSIS — Z5111 Encounter for antineoplastic chemotherapy: Secondary | ICD-10-CM

## 2021-01-10 DIAGNOSIS — E876 Hypokalemia: Secondary | ICD-10-CM | POA: Diagnosis not present

## 2021-01-10 DIAGNOSIS — Z510 Encounter for antineoplastic radiation therapy: Secondary | ICD-10-CM | POA: Diagnosis not present

## 2021-01-10 DIAGNOSIS — D701 Agranulocytosis secondary to cancer chemotherapy: Secondary | ICD-10-CM

## 2021-01-10 DIAGNOSIS — D61810 Antineoplastic chemotherapy induced pancytopenia: Secondary | ICD-10-CM | POA: Insufficient documentation

## 2021-01-10 DIAGNOSIS — T451X5A Adverse effect of antineoplastic and immunosuppressive drugs, initial encounter: Secondary | ICD-10-CM | POA: Insufficient documentation

## 2021-01-10 DIAGNOSIS — D649 Anemia, unspecified: Secondary | ICD-10-CM

## 2021-01-10 DIAGNOSIS — Z79899 Other long term (current) drug therapy: Secondary | ICD-10-CM | POA: Insufficient documentation

## 2021-01-10 LAB — CMP (CANCER CENTER ONLY)
ALT: 19 U/L (ref 0–44)
AST: 16 U/L (ref 15–41)
Albumin: 3.3 g/dL — ABNORMAL LOW (ref 3.5–5.0)
Alkaline Phosphatase: 79 U/L (ref 38–126)
Anion gap: 4 — ABNORMAL LOW (ref 5–15)
BUN: 15 mg/dL (ref 8–23)
CO2: 34 mmol/L — ABNORMAL HIGH (ref 22–32)
Calcium: 8.4 mg/dL — ABNORMAL LOW (ref 8.9–10.3)
Chloride: 98 mmol/L (ref 98–111)
Creatinine: 0.74 mg/dL (ref 0.44–1.00)
GFR, Estimated: >60 mL/min (ref >60)
Glucose, Bld: 91 mg/dL (ref 70–99)
Potassium: 3.9 mmol/L (ref 3.5–5.1)
Sodium: 136 mmol/L (ref 135–145)
Total Bilirubin: 0.3 mg/dL (ref 0.3–1.2)
Total Protein: 5.7 g/dL — ABNORMAL LOW (ref 6.5–8.1)

## 2021-01-10 LAB — CBC WITH DIFFERENTIAL (CANCER CENTER ONLY)
Abs Immature Granulocytes: 0.01 10*3/uL (ref 0.00–0.07)
Basophils Absolute: 0 10*3/uL (ref 0.0–0.1)
Basophils Relative: 1 %
Eosinophils Absolute: 0 10*3/uL (ref 0.0–0.5)
Eosinophils Relative: 0 %
HCT: 26.9 % — ABNORMAL LOW (ref 36.0–46.0)
Hemoglobin: 9 g/dL — ABNORMAL LOW (ref 12.0–15.0)
Immature Granulocytes: 0 %
Lymphocytes Relative: 10 %
Lymphs Abs: 0.3 10*3/uL — ABNORMAL LOW (ref 0.7–4.0)
MCH: 33.5 pg (ref 26.0–34.0)
MCHC: 33.5 g/dL (ref 30.0–36.0)
MCV: 100 fL (ref 80.0–100.0)
Monocytes Absolute: 0.3 10*3/uL (ref 0.1–1.0)
Monocytes Relative: 12 %
Neutro Abs: 1.9 10*3/uL (ref 1.7–7.7)
Neutrophils Relative %: 77 %
Platelet Count: 97 10*3/uL — ABNORMAL LOW (ref 150–400)
RBC: 2.69 MIL/uL — ABNORMAL LOW (ref 3.87–5.11)
RDW: 21 % — ABNORMAL HIGH (ref 11.5–15.5)
WBC Count: 2.5 10*3/uL — ABNORMAL LOW (ref 4.0–10.5)
nRBC: 0 % (ref 0.0–0.2)

## 2021-01-10 LAB — MAGNESIUM: Magnesium: 1.3 mg/dL — ABNORMAL LOW (ref 1.7–2.4)

## 2021-01-10 LAB — SAMPLE TO BLOOD BANK

## 2021-01-10 MED ORDER — MAGNESIUM SULFATE 2 GM/50ML IV SOLN: 2.0000 g | Freq: Once | INTRAVENOUS | Status: DC

## 2021-01-10 MED ORDER — SODIUM CHLORIDE 0.9 % IV SOLN: 2.0000 g | Freq: Once | INTRAVENOUS | Status: DC

## 2021-01-10 NOTE — Progress Notes (Signed)
[facility] [phone]   [phone]  OFFICE PROGRESS NOTE  [name], MD [place] [address]  DIAGNOSIS:  Limited stage (T2a, N2, M0) small cell lung cancer presented with left lower lobe lung mass in addition to left infrahilar and subcarinal lymphadenopathy diagnosed in October 2022.   PRIOR THERAPY: None   CURRENT THERAPY: She is currently undergoing concurrent chemoradiation with cisplatin 80 mg/m2  on days 1 and etoposide 100 mg/m2 on days 1, 2, and 3 IV every 3 weeks. First dose 10/31/20.  Status post 3  cycles.  Her dose of cisplatin was reduced to 60 Mg/M2 from cycle #3 and starting cycle #4 of etoposide was also reduced to 80 Mg/M2 secondary to intolerance and pancytopenia  INTERVAL HISTORY: Patricia [name] 71 y.o. female returns to the clinic today for follow-up visit accompanied by her husband.  The patient continues to complain of increasing fatigue and weakness as well as lack of appetite and shortness of breath.  She is currently on home oxygen.  She was seen in the hospital a week ago with significant pancytopenia as well as atrial fibrillation.  The patient received Granix during her hospitalization.  She also received PRBCs transfusion.  She is feeling a little bit better today but still complaining of fatigue and weakness.  She has no current chest pain, cough or hemoptysis.  She has no nausea, vomiting, diarrhea or constipation.  She is here today for evaluation before starting cycle #4.   MEDICAL HISTORY: Past Medical History:  Diagnosis Date   Anxiety    GERD (gastroesophageal reflux disease)    Hyperlipidemia    Hypertension    IBS (irritable bowel syndrome)    Insomnia    Low back pain    Lung cancer ([place]) 10/2020   TIA (transient ischemic attack) 10/2017   no deficits   Vitamin B12 deficiency    Vitamin D deficiency     ALLERGIES:  is allergic to augmentin [amoxicillin-pot  clavulanate], levaquin [levofloxacin in d5w], mom [magnesium hydroxide], oxycodone, and other.  MEDICATIONS:  Current Outpatient Medications  Medication Sig Dispense Refill   carboxymethylcellulose (REFRESH PLUS) 0.5 % SOLN Place 1 drop into both eyes 3 (three) times daily as needed (for dryness).      carisoprodol (SOMA) 350 MG tablet Take 350 mg by mouth 3 (three) times daily.   5   Cholecalciferol (VITAMIN D3) 50 MCG (2000 UT) TABS Take 2,000 Units by mouth daily.     clobetasol ointment (TEMOVATE) 0.10 % Apply 1 application topically See admin instructions. Apply to vaginal area daily as directed     clonazePAM (KLONOPIN) 1 MG tablet Take 1 mg by mouth at bedtime.   3   diltiazem (CARDIZEM CD) 360 MG 24 hr capsule Take 1 capsule (360 mg total) by mouth daily. 90 capsule 2   docusate sodium (COLACE) 100 MG capsule Take 1 capsule (100 mg total) by mouth 2 (two) times daily. (Patient taking differently: Take 100 mg by mouth as needed for mild constipation or moderate constipation.) 10 capsule 0   ELIQUIS 5 MG TABS tablet TAKE 1 TABLET(5 MG) BY MOUTH TWICE DAILY 60 tablet 6   escitalopram (LEXAPRO) 10 MG tablet Take 1 tablet (10 mg total) by mouth daily. 30 tablet 2   fluticasone (FLONASE) 50 MCG/ACT nasal spray Place 2 sprays into both nostrils daily as needed for allergies or rhinitis.   5   furosemide (  LASIX) 20 MG tablet Take 1 tablet (20 mg total) by mouth daily. 30 tablet 2   gabapentin (NEURONTIN) 600 MG tablet Take 600 mg by mouth See admin instructions. Take 600 mg by mouth in the morning and at lunchtime     guaiFENesin-dextromethorphan (ROBITUSSIN DM) 100-10 MG/5ML syrup Take 10 mLs by mouth every 4 (four) hours as needed for cough. 118 mL 0   HYDROcodone-acetaminophen (NORCO) 7.5-325 MG tablet Take 1 tablet by mouth every 6 (six) hours as needed for severe pain. 15 tablet 0   ipratropium (ATROVENT) 0.02 % nebulizer solution 3 (three) times daily.     levalbuterol (XOPENEX) 0.63 MG/3ML  nebulizer solution 3 (three) times daily.     lidocaine (XYLOCAINE) 2 % solution Use as directed 15 mLs in the mouth or throat every 6 (six) hours as needed for mouth pain. 100 mL 0   linaclotide (LINZESS) 145 MCG CAPS capsule Take 1 capsule (145 mcg total) by mouth daily at 4 PM. (Patient taking differently: Take 145 mcg by mouth as needed.) 30 capsule 0   magic mouthwash SOLN Take 5 mLs by mouth 4 (four) times daily as needed for mouth pain. Pt is allergic to Magnesium (Patient not taking: Reported on 12/25/2020) 240 mL 1   magnesium oxide (MAG-OX) 400 (240 Mg) MG tablet Take 1 tablet (400 mg total) by mouth daily. 30 tablet 1   Metoprolol Tartrate 75 MG TABS Take 75 mg by mouth every 8 (eight) hours. 90 tablet 2   nystatin (MYCOSTATIN) 100000 UNIT/ML suspension Take 5 mLs (500,000 Units total) by mouth 4 (four) times daily. 440 mL 5   omeprazole (PRILOSEC) 20 MG capsule Take 20 mg by mouth daily before breakfast.      ondansetron (ZOFRAN-ODT) 8 MG disintegrating tablet Take 8 mg by mouth as needed.     polyethylene glycol (MIRALAX / GLYCOLAX) packet Take 34 g by mouth in the morning.      prochlorperazine (COMPAZINE) 10 MG tablet Take 1 tablet (10 mg total) by mouth every 6 (six) hours as needed for nausea or vomiting. 30 tablet 0   rosuvastatin (CRESTOR) 5 MG tablet Take 5 mg by mouth 3 (three) times a week. Mondays Wednesdays Fridays     sodium chloride 1 g tablet Take 1 g by mouth 3 (three) times daily.     traMADol (ULTRAM) 50 MG tablet Take 100 mg by mouth in the morning, at noon, and at bedtime.     zaleplon (SONATA) 10 MG capsule Take 10 mg by mouth at bedtime as needed (for interrupted sleep).      No current facility-administered medications for this visit.    SURGICAL HISTORY:  Past Surgical History:  Procedure Laterality Date   BACK SURGERY     BREAST EXCISIONAL BIOPSY Left 1997   benign   BRONCHIAL BIOPSY  10/18/2020   Procedure: BRONCHIAL BIOPSIES;  Surgeon: [name], DO;  Location: [facility];  Service: Pulmonary;;   BRONCHIAL BRUSHINGS  10/18/2020   Procedure: BRONCHIAL BRUSHINGS;  Surgeon: [name], DO;  Location: [facility];  Service: Pulmonary;;   BRONCHIAL NEEDLE ASPIRATION BIOPSY  10/18/2020   Procedure: BRONCHIAL NEEDLE ASPIRATION BIOPSIES;  Surgeon: [name], DO;  Location: [facility];  Service: Pulmonary;;   BRONCHIAL WASHINGS  10/18/2020   Procedure: BRONCHIAL WASHINGS;  Surgeon: [name], DO;  Location: [facility];  Service: Pulmonary;;   hemorrhoidecotmy     HERNIA MESH REMOVAL     OPEN REDUCTION  INTERNAL FIXATION (ORIF) DISTAL RADIAL FRACTURE Right 11/19/2019   Procedure: OPEN REDUCTION INTERNAL FIXATION (ORIF) DISTAL RADIUS AND ULNA FRACTURE WITH REPAIR AS NECESSARY;  Surgeon: Roseanne Kaufman, MD;  Location: [facility];  Service: Orthopedics;  Laterality: Right;  2 hrs Block with IV sedation   ORIF RADIUS & ULNA FRACTURES     TONSILLECTOMY     VIDEO BRONCHOSCOPY WITH ENDOBRONCHIAL NAVIGATION Left 10/18/2020   Procedure: VIDEO BRONCHOSCOPY WITH ENDOBRONCHIAL NAVIGATION;  Surgeon: [name], DO;  Location: [facility];  Service: Pulmonary;  Laterality: Left;  ION   VIDEO BRONCHOSCOPY WITH ENDOBRONCHIAL ULTRASOUND Bilateral 10/18/2020   Procedure: VIDEO BRONCHOSCOPY WITH ENDOBRONCHIAL ULTRASOUND;  Surgeon: [name], DO;  Location: [facility];  Service: Pulmonary;  Laterality: Bilateral;   VIDEO BRONCHOSCOPY WITH RADIAL ENDOBRONCHIAL ULTRASOUND  10/18/2020   Procedure: VIDEO BRONCHOSCOPY WITH RADIAL ENDOBRONCHIAL ULTRASOUND;  Surgeon: [name], DO;  Location: [facility];  Service: Pulmonary;;    REVIEW OF SYSTEMS:  Constitutional: positive for anorexia, fatigue, and weight loss Eyes: negative Ears, nose, mouth, throat, and face: negative Respiratory: positive for dyspnea on exertion Cardiovascular: negative Gastrointestinal: negative Genitourinary:negative Integument/breast:  negative Hematologic/lymphatic: negative Musculoskeletal:positive for muscle weakness Neurological: negative Behavioral/Psych: negative Endocrine: negative Allergic/Immunologic: negative   PHYSICAL EXAMINATION: General appearance: alert, cooperative, fatigued, and no distress Head: Normocephalic, without obvious abnormality, atraumatic Neck: no adenopathy, no JVD, supple, symmetrical, trachea midline, and thyroid not enlarged, symmetric, no tenderness/mass/nodules Lymph nodes: Cervical, supraclavicular, and axillary nodes normal. Resp: clear to auscultation bilaterally Back: symmetric, no curvature. ROM normal. No CVA tenderness. Cardio: regular rate and rhythm, S1, S2 normal, no murmur, click, rub or gallop GI: soft, non-tender; bowel sounds normal; no masses,  no organomegaly Extremities: extremities normal, atraumatic, no cyanosis or edema Neurologic: Alert and oriented X 3, normal strength and tone. Normal symmetric reflexes. Normal coordination and gait  ECOG PERFORMANCE STATUS: 1 - Symptomatic but completely ambulatory  Blood pressure 101/60, pulse 63, temperature 98.2 F (36.8 C), temperature source Oral, resp. rate 19, height 5\' 6"  (1.676 m), weight 187 lb 1.6 oz (84.9 kg), SpO2 96 %.  LABORATORY DATA: Lab Results  Component Value Date   WBC 2.5 (L) 01/10/2021   HGB 9.0 (L) 01/10/2021   HCT 26.9 (L) 01/10/2021   MCV 100.0 01/10/2021   PLT 97 (L) 01/10/2021      Chemistry      Component Value Date/Time   NA 131 (L) 01/03/2021 0431   K 3.7 01/03/2021 0431   CL 93 (L) 01/03/2021 0431   CO2 32 01/03/2021 0431   BUN 17 01/03/2021 0431   CREATININE 0.63 01/03/2021 0431   CREATININE 0.77 12/20/2020 1341      Component Value Date/Time   CALCIUM 7.9 (L) 01/03/2021 0431   ALKPHOS 105 12/25/2020 1221   AST 50 (H) 12/25/2020 1221   AST 13 (L) 12/20/2020 1341   ALT 54 (H) 12/25/2020 1221   ALT 12 12/20/2020 1341   BILITOT 0.5 12/25/2020 1221   BILITOT 0.5 12/20/2020  1341       RADIOGRAPHIC STUDIES: DG Chest 2 View  Result Date: 12/25/2020 CLINICAL DATA:  Fatigue and shortness of breath. Lung cancer patient receiving chemotherapy and radiation therapy. EXAM: CHEST - 2 VIEW COMPARISON:  12/18/2020.  CT, 12/08/2020. FINDINGS: Cardiac silhouette is normal in size. No mediastinal or hilar masses or evidence of adenopathy. Mild streaky opacity at the left lung base consistent with atelectasis or scarring. Lungs otherwise clear. Relative lucency in the upper lobes consistent  with emphysema. Minimal pleural effusions.  No pneumothorax. Chronic compression fracture of L2.  No acute skeletal abnormality. IMPRESSION: 1. No acute cardiopulmonary disease. Electronically Signed   By: Lajean Manes M.D.   On: 12/25/2020 12:18   DG Chest 2 View  Result Date: 12/18/2020 CLINICAL DATA:  Chest pain EXAM: CHEST - 2 VIEW COMPARISON:  Chest x-ray dated November 11, 2020 FINDINGS: Cardiac and mediastinal contours are within normal limits. Mild left basilar opacities. No focal consolidation. No pleural effusion or pneumothorax. IMPRESSION: Mild left basilar opacities, likely due to scarring or atelectasis. Electronically Signed   By: Yetta Glassman M.D.   On: 12/18/2020 11:37   CT CHEST WO CONTRAST  Result Date: 12/27/2020 CLINICAL DATA:  Respiratory illness. Shortness of breath and tachypnea. Ongoing treatments for lung cancer. EXAM: CT CHEST WITHOUT CONTRAST TECHNIQUE: Multidetector CT imaging of the chest was performed following the standard protocol without IV contrast. COMPARISON:  CT of the chest 12/08/2020. FINDINGS: Cardiovascular: Heart is borderline enlarged, unchanged. Aorta is normal in size. There are atherosclerotic calcifications of the aorta and coronary arteries. There is no pericardial effusion. Mediastinum/Nodes: There are no enlarged mediastinal or hilar lymph nodes identified on this noncontrast study. The esophagus and visualized thyroid gland are within  normal limits. Lungs/Pleura: There are new small bilateral pleural effusions. There is new bilateral lower lobe atelectasis. There may be a small amount of airspace consolidation in the left lower lobe. Moderate emphysematous changes are again seen. There is no pneumothorax. There are minimal secretions in the trachea. Trachea and central airways appear patent. Upper Abdomen: No acute abnormality. Musculoskeletal: Vertebroplasty changes are seen at L2. No acute fractures identified. There is a healed sternal fracture, unchanged. There is mild body wall edema, new from prior. IMPRESSION: 1. New small bilateral pleural effusions. 2. New bilateral lower lobe atelectasis. Can not exclude small amount of airspace disease/pneumonia in the left lung base. 3. No new enlarged mediastinal or hilar lymph nodes allowing for lack of intravenous contrast. 4. New body wall edema. 5. Aortic Atherosclerosis (ICD10-I70.0) and Emphysema (ICD10-J43.9). Electronically Signed   By: Ronney Asters M.D.   On: 12/27/2020 20:46    ASSESSMENT AND PLAN: This is a very pleasant 71 years old white female recently diagnosed with limited stage (T2 a, N2, M0) small cell lung cancer presented with left lower lobe lung mass in addition to left hilar and subcarinal lymphadenopathy in October 2022.  The patient is currently undergoing a course of systemic chemotherapy with cisplatin and etoposide status post 3 cycles concurrent with radiation. The patient continues to have significant fatigue and weakness as well as pancytopenia with her treatment. I recommended for her to delay the start of cycle number 4 x 1 week until improvement of her general condition before proceeding with cycle #4. Starting from cycle #4 her dose of cisplatin will be reduced to 60 Mg/M2 on day 1 and 2 etoposide 80 Mg/M2 on days 1, 2 and 3. She is expected to complete the course of radiotherapy later this week. I will see her back for follow-up visit in 1 months for  evaluation close to the end of her treatment. Her most recent CT scan of the chest performed on December 27, 2020 showed bilateral pleural effusion and generalized edema probably secondary to her atrial fibrillation and cardiac condition. The patient was advised to call immediately if she has any other concerning symptoms in the interval. The patient voices understanding of current disease status and treatment options and is  in agreement with the current care plan.  All questions were answered. The patient knows to call the clinic with any problems, questions or concerns. We can certainly see the patient much sooner if necessary. The total time spent in the appointment was 32 minutes.  Disclaimer: This note was dictated with voice recognition software. Similar sounding words can inadvertently be transcribed and may not be corrected upon review.

## 2021-01-10 NOTE — Addendum Note (Signed)
Addended by: Dicie Beam D on: 01/10/2021 02:37 PM   Modules accepted: Orders

## 2021-01-11 ENCOUNTER — Ambulatory Visit
Admission: RE | Admit: 2021-01-11 | Discharge: 2021-01-11 | Disposition: A | Discharge status: Home / Self Care | Payer: Medicare HMO | Source: Ambulatory Visit | Attending: Radiation Oncology | Admitting: Radiation Oncology

## 2021-01-11 ENCOUNTER — Ambulatory Visit: Payer: Medicare HMO

## 2021-01-11 DIAGNOSIS — E876 Hypokalemia: Secondary | ICD-10-CM | POA: Diagnosis not present

## 2021-01-11 DIAGNOSIS — Z5111 Encounter for antineoplastic chemotherapy: Secondary | ICD-10-CM | POA: Diagnosis not present

## 2021-01-11 DIAGNOSIS — C3432 Malignant neoplasm of lower lobe, left bronchus or lung: Secondary | ICD-10-CM | POA: Diagnosis not present

## 2021-01-11 DIAGNOSIS — T451X5A Adverse effect of antineoplastic and immunosuppressive drugs, initial encounter: Secondary | ICD-10-CM | POA: Diagnosis not present

## 2021-01-11 DIAGNOSIS — D701 Agranulocytosis secondary to cancer chemotherapy: Secondary | ICD-10-CM | POA: Diagnosis not present

## 2021-01-11 DIAGNOSIS — C771 Secondary and unspecified malignant neoplasm of intrathoracic lymph nodes: Secondary | ICD-10-CM | POA: Diagnosis not present

## 2021-01-11 DIAGNOSIS — Z510 Encounter for antineoplastic radiation therapy: Secondary | ICD-10-CM | POA: Diagnosis not present

## 2021-01-11 DIAGNOSIS — Z5189 Encounter for other specified aftercare: Secondary | ICD-10-CM | POA: Diagnosis not present

## 2021-01-11 NOTE — Addendum Note (Signed)
Addended by: Ardeen Garland on: 01/11/2021 11:59 AM   Modules accepted: Orders

## 2021-01-12 ENCOUNTER — Ambulatory Visit: Payer: Medicare HMO

## 2021-01-12 ENCOUNTER — Ambulatory Visit
Admission: RE | Admit: 2021-01-12 | Discharge: 2021-01-12 | Disposition: A | Discharge status: Home / Self Care | Payer: Medicare HMO | Source: Ambulatory Visit | Attending: Radiation Oncology | Admitting: Radiation Oncology

## 2021-01-12 ENCOUNTER — Other Ambulatory Visit: Payer: Self-pay

## 2021-01-12 ENCOUNTER — Inpatient Hospital Stay: Payer: Medicare HMO

## 2021-01-12 DIAGNOSIS — C3432 Malignant neoplasm of lower lobe, left bronchus or lung: Secondary | ICD-10-CM | POA: Diagnosis not present

## 2021-01-12 DIAGNOSIS — E876 Hypokalemia: Secondary | ICD-10-CM | POA: Diagnosis not present

## 2021-01-12 DIAGNOSIS — T451X5A Adverse effect of antineoplastic and immunosuppressive drugs, initial encounter: Secondary | ICD-10-CM | POA: Diagnosis not present

## 2021-01-12 DIAGNOSIS — Z5111 Encounter for antineoplastic chemotherapy: Secondary | ICD-10-CM | POA: Diagnosis not present

## 2021-01-12 DIAGNOSIS — Z510 Encounter for antineoplastic radiation therapy: Secondary | ICD-10-CM | POA: Diagnosis not present

## 2021-01-12 DIAGNOSIS — C771 Secondary and unspecified malignant neoplasm of intrathoracic lymph nodes: Secondary | ICD-10-CM | POA: Diagnosis not present

## 2021-01-12 DIAGNOSIS — Z5189 Encounter for other specified aftercare: Secondary | ICD-10-CM | POA: Diagnosis not present

## 2021-01-12 DIAGNOSIS — D701 Agranulocytosis secondary to cancer chemotherapy: Secondary | ICD-10-CM | POA: Diagnosis not present

## 2021-01-13 ENCOUNTER — Ambulatory Visit (HOSPITAL_COMMUNITY): Admit: 2021-01-13 | Payer: Medicare HMO | Admitting: Cardiovascular Disease

## 2021-01-13 ENCOUNTER — Telehealth: Payer: Self-pay | Admitting: Internal Medicine

## 2021-01-13 ENCOUNTER — Encounter: Payer: Self-pay | Admitting: Radiation Oncology

## 2021-01-13 ENCOUNTER — Ambulatory Visit: Payer: Medicare HMO

## 2021-01-13 ENCOUNTER — Ambulatory Visit
Admission: RE | Admit: 2021-01-13 | Discharge: 2021-01-13 | Disposition: A | Discharge status: Home / Self Care | Payer: Medicare HMO | Source: Ambulatory Visit | Attending: Radiation Oncology | Admitting: Radiation Oncology

## 2021-01-13 ENCOUNTER — Encounter (HOSPITAL_COMMUNITY): Payer: Self-pay

## 2021-01-13 DIAGNOSIS — Z510 Encounter for antineoplastic radiation therapy: Secondary | ICD-10-CM | POA: Diagnosis not present

## 2021-01-13 DIAGNOSIS — C771 Secondary and unspecified malignant neoplasm of intrathoracic lymph nodes: Secondary | ICD-10-CM | POA: Diagnosis not present

## 2021-01-13 DIAGNOSIS — T451X5A Adverse effect of antineoplastic and immunosuppressive drugs, initial encounter: Secondary | ICD-10-CM | POA: Diagnosis not present

## 2021-01-13 DIAGNOSIS — Z5189 Encounter for other specified aftercare: Secondary | ICD-10-CM | POA: Diagnosis not present

## 2021-01-13 DIAGNOSIS — E876 Hypokalemia: Secondary | ICD-10-CM | POA: Diagnosis not present

## 2021-01-13 DIAGNOSIS — D701 Agranulocytosis secondary to cancer chemotherapy: Secondary | ICD-10-CM | POA: Diagnosis not present

## 2021-01-13 DIAGNOSIS — C3432 Malignant neoplasm of lower lobe, left bronchus or lung: Secondary | ICD-10-CM | POA: Diagnosis not present

## 2021-01-13 DIAGNOSIS — Z5111 Encounter for antineoplastic chemotherapy: Secondary | ICD-10-CM | POA: Diagnosis not present

## 2021-01-13 SURGERY — CARDIOVERSION
Anesthesia: General

## 2021-01-13 NOTE — Telephone Encounter (Signed)
Sch per 1/4 los, pt aware

## 2021-01-14 ENCOUNTER — Inpatient Hospital Stay: Payer: Medicare HMO

## 2021-01-16 MED FILL — Dexamethasone Sodium Phosphate Inj 100 MG/10ML: INTRAMUSCULAR | Qty: 1 | Status: AC

## 2021-01-16 MED FILL — Fosaprepitant Dimeglumine For IV Infusion 150 MG (Base Eq): INTRAVENOUS | Qty: 5 | Status: AC

## 2021-01-17 ENCOUNTER — Inpatient Hospital Stay: Payer: Medicare HMO

## 2021-01-17 ENCOUNTER — Other Ambulatory Visit: Payer: Self-pay

## 2021-01-17 VITALS — BP 121/68 | HR 94 | Temp 98.2°F | Resp 18

## 2021-01-17 DIAGNOSIS — C3432 Malignant neoplasm of lower lobe, left bronchus or lung: Secondary | ICD-10-CM | POA: Diagnosis not present

## 2021-01-17 DIAGNOSIS — D649 Anemia, unspecified: Secondary | ICD-10-CM

## 2021-01-17 DIAGNOSIS — Z5189 Encounter for other specified aftercare: Secondary | ICD-10-CM | POA: Diagnosis not present

## 2021-01-17 DIAGNOSIS — Z5111 Encounter for antineoplastic chemotherapy: Secondary | ICD-10-CM | POA: Diagnosis not present

## 2021-01-17 DIAGNOSIS — T451X5A Adverse effect of antineoplastic and immunosuppressive drugs, initial encounter: Secondary | ICD-10-CM | POA: Diagnosis not present

## 2021-01-17 DIAGNOSIS — C771 Secondary and unspecified malignant neoplasm of intrathoracic lymph nodes: Secondary | ICD-10-CM | POA: Diagnosis not present

## 2021-01-17 DIAGNOSIS — Z9981 Dependence on supplemental oxygen: Secondary | ICD-10-CM | POA: Diagnosis present

## 2021-01-17 DIAGNOSIS — D61810 Antineoplastic chemotherapy induced pancytopenia: Secondary | ICD-10-CM | POA: Insufficient documentation

## 2021-01-17 DIAGNOSIS — Z79899 Other long term (current) drug therapy: Secondary | ICD-10-CM | POA: Diagnosis not present

## 2021-01-17 LAB — CMP (CANCER CENTER ONLY)
ALT: 20 U/L (ref 0–44)
AST: 22 U/L (ref 15–41)
Albumin: 3.5 g/dL (ref 3.5–5.0)
Alkaline Phosphatase: 85 U/L (ref 38–126)
Anion gap: 4 — ABNORMAL LOW (ref 5–15)
BUN: 16 mg/dL (ref 8–23)
CO2: 33 mmol/L — ABNORMAL HIGH (ref 22–32)
Calcium: 8.5 mg/dL — ABNORMAL LOW (ref 8.9–10.3)
Chloride: 101 mmol/L (ref 98–111)
Creatinine: 0.81 mg/dL (ref 0.44–1.00)
GFR, Estimated: >60 mL/min (ref >60)
Glucose, Bld: 111 mg/dL — ABNORMAL HIGH (ref 70–99)
Potassium: 4 mmol/L (ref 3.5–5.1)
Sodium: 138 mmol/L (ref 135–145)
Total Bilirubin: 0.4 mg/dL (ref 0.3–1.2)
Total Protein: 6 g/dL — ABNORMAL LOW (ref 6.5–8.1)

## 2021-01-17 LAB — CBC WITH DIFFERENTIAL (CANCER CENTER ONLY)
Abs Immature Granulocytes: 0.01 10*3/uL (ref 0.00–0.07)
Basophils Absolute: 0 10*3/uL (ref 0.0–0.1)
Basophils Relative: 0 %
Eosinophils Absolute: 0 10*3/uL (ref 0.0–0.5)
Eosinophils Relative: 2 %
HCT: 27.8 % — ABNORMAL LOW (ref 36.0–46.0)
Hemoglobin: 9.3 g/dL — ABNORMAL LOW (ref 12.0–15.0)
Immature Granulocytes: 0 %
Lymphocytes Relative: 6 %
Lymphs Abs: 0.2 10*3/uL — ABNORMAL LOW (ref 0.7–4.0)
MCH: 34.1 pg — ABNORMAL HIGH (ref 26.0–34.0)
MCHC: 33.5 g/dL (ref 30.0–36.0)
MCV: 101.8 fL — ABNORMAL HIGH (ref 80.0–100.0)
Monocytes Absolute: 0.2 10*3/uL (ref 0.1–1.0)
Monocytes Relative: 9 %
Neutro Abs: 2.1 10*3/uL (ref 1.7–7.7)
Neutrophils Relative %: 83 %
Platelet Count: 106 10*3/uL — ABNORMAL LOW (ref 150–400)
RBC: 2.73 MIL/uL — ABNORMAL LOW (ref 3.87–5.11)
RDW: 22.9 % — ABNORMAL HIGH (ref 11.5–15.5)
WBC Count: 2.5 10*3/uL — ABNORMAL LOW (ref 4.0–10.5)
nRBC: 0 % (ref 0.0–0.2)

## 2021-01-17 LAB — SAMPLE TO BLOOD BANK

## 2021-01-17 LAB — MAGNESIUM: Magnesium: 1.3 mg/dL — ABNORMAL LOW (ref 1.7–2.4)

## 2021-01-17 MED ORDER — SODIUM CHLORIDE 0.9 % IV SOLN: Freq: Once | INTRAVENOUS | Status: AC

## 2021-01-17 MED ORDER — SODIUM CHLORIDE 0.9 % IV SOLN
80.0000 mg/m2 | Freq: Once | INTRAVENOUS | Status: AC
  Administered 2021-01-17: 160 mg via INTRAVENOUS
  Filled 2021-01-17: qty 8

## 2021-01-17 MED ORDER — MAGNESIUM SULFATE 2 GM/50ML IV SOLN
2.0000 g | Freq: Once | INTRAVENOUS | Status: AC
  Administered 2021-01-17: 2 g via INTRAVENOUS
  Filled 2021-01-17: qty 50

## 2021-01-17 MED ORDER — PALONOSETRON HCL INJECTION 0.25 MG/5ML
0.2500 mg | Freq: Once | INTRAVENOUS | Status: AC
  Administered 2021-01-17: 0.25 mg via INTRAVENOUS
  Filled 2021-01-17: qty 5

## 2021-01-17 MED ORDER — SODIUM CHLORIDE 0.9 % IV SOLN
150.0000 mg | Freq: Once | INTRAVENOUS | Status: AC
  Administered 2021-01-17: 150 mg via INTRAVENOUS
  Filled 2021-01-17: qty 150

## 2021-01-17 MED ORDER — MAGNESIUM SULFATE 4 GM/100ML IV SOLN
4.0000 g | Freq: Once | INTRAVENOUS | Status: AC
  Administered 2021-01-17: 4 g via INTRAVENOUS
  Filled 2021-01-17: qty 100

## 2021-01-17 MED ORDER — SODIUM CHLORIDE 0.9 % IV SOLN
60.0000 mg/m2 | Freq: Once | INTRAVENOUS | Status: AC
  Administered 2021-01-17: 120 mg via INTRAVENOUS
  Filled 2021-01-17: qty 120

## 2021-01-17 MED ORDER — SODIUM CHLORIDE 0.9 % IV SOLN
10.0000 mg | Freq: Once | INTRAVENOUS | Status: AC
  Administered 2021-01-17: 10 mg via INTRAVENOUS
  Filled 2021-01-17: qty 10

## 2021-01-17 MED ORDER — POTASSIUM CHLORIDE IN NACL 20-0.9 MEQ/L-% IV SOLN
Freq: Once | INTRAVENOUS | Status: AC
  Filled 2021-01-17: qty 1000

## 2021-01-17 MED FILL — Dexamethasone Sodium Phosphate Inj 100 MG/10ML: INTRAMUSCULAR | Qty: 1 | Status: AC

## 2021-01-17 NOTE — Progress Notes (Addendum)
Reviewed Mag level 1.3 with MD. Add Magnesium 4gm premixed bag to run with post-cisplatin IVF over 2 hours for a total Magnesium dose of 6gm today.  Patient received Magnesium 2gm with pre-cisplatin IVF.  Raul Del Hissop, , BCPS, BCOP 01/17/2021 9:29 AM

## 2021-01-18 ENCOUNTER — Inpatient Hospital Stay: Payer: Medicare HMO

## 2021-01-18 ENCOUNTER — Encounter: Payer: Self-pay | Admitting: Internal Medicine

## 2021-01-18 ENCOUNTER — Encounter: Payer: Self-pay | Admitting: Physician Assistant

## 2021-01-18 VITALS — BP 127/75 | HR 72 | Temp 98.4°F | Resp 18

## 2021-01-18 DIAGNOSIS — D61810 Antineoplastic chemotherapy induced pancytopenia: Secondary | ICD-10-CM | POA: Diagnosis not present

## 2021-01-18 DIAGNOSIS — T451X5A Adverse effect of antineoplastic and immunosuppressive drugs, initial encounter: Secondary | ICD-10-CM | POA: Diagnosis not present

## 2021-01-18 DIAGNOSIS — Z5111 Encounter for antineoplastic chemotherapy: Secondary | ICD-10-CM | POA: Diagnosis not present

## 2021-01-18 DIAGNOSIS — C771 Secondary and unspecified malignant neoplasm of intrathoracic lymph nodes: Secondary | ICD-10-CM | POA: Diagnosis not present

## 2021-01-18 DIAGNOSIS — Z5189 Encounter for other specified aftercare: Secondary | ICD-10-CM | POA: Diagnosis not present

## 2021-01-18 DIAGNOSIS — C3432 Malignant neoplasm of lower lobe, left bronchus or lung: Secondary | ICD-10-CM

## 2021-01-18 DIAGNOSIS — Z79899 Other long term (current) drug therapy: Secondary | ICD-10-CM | POA: Diagnosis not present

## 2021-01-18 MED ORDER — SODIUM CHLORIDE 0.9 % IV SOLN
80.0000 mg/m2 | Freq: Once | INTRAVENOUS | Status: AC
  Administered 2021-01-18: 160 mg via INTRAVENOUS
  Filled 2021-01-18: qty 8

## 2021-01-18 MED ORDER — SODIUM CHLORIDE 0.9 % IV SOLN: Freq: Once | INTRAVENOUS | Status: AC

## 2021-01-18 MED ORDER — SODIUM CHLORIDE 0.9 % IV SOLN
10.0000 mg | Freq: Once | INTRAVENOUS | Status: AC
  Administered 2021-01-18: 10 mg via INTRAVENOUS
  Filled 2021-01-18: qty 10

## 2021-01-18 MED FILL — Dexamethasone Sodium Phosphate Inj 100 MG/10ML: INTRAMUSCULAR | Qty: 1 | Status: AC

## 2021-01-18 NOTE — Progress Notes (Signed)
° °                                                                                                                                                          °  Patient Name: Patricia [name] MRN: [id number] DOB: 1950/02/25 Referring Physician: June Leap Date of Service: 01/13/2021  [facility],                                                         End Of Treatment Note  Diagnoses: C34.32-Malignant neoplasm of lower lobe, left bronchus or lung  Cancer Staging:  Limited Stage Small Cell Carcinoma of the LLL  Intent: Curative  Radiation Treatment Dates: 11/21/2020 through 01/13/2021 Site Technique Total Dose (Gy) Dose per Fx (Gy) Completed Fx Beam Energies  Lung, Left: Lung_Lt 3D 60/60 2 30/30 6X  Lung, Left: Lung_Lt_Bst 3D 6/6 2 3/3 6X   Narrative: The patient tolerated radiation therapy sessions but did develop esophagitis that was difficult to manage due to medication intolerance and schedule.   Plan: The patient will receive a call in about one month from the radiation oncology department. She will continue follow up with Dr. Julien Nordmann as well. We will also plan to discuss PCI after the conclusion of her ongoing chemotherapy.  ________________________________________________    Carola Rhine, PAC

## 2021-01-19 ENCOUNTER — Inpatient Hospital Stay: Payer: Medicare HMO

## 2021-01-19 ENCOUNTER — Other Ambulatory Visit: Payer: Self-pay

## 2021-01-19 VITALS — BP 127/97 | HR 80 | Temp 97.9°F | Resp 18

## 2021-01-19 DIAGNOSIS — D61810 Antineoplastic chemotherapy induced pancytopenia: Secondary | ICD-10-CM | POA: Diagnosis not present

## 2021-01-19 DIAGNOSIS — C771 Secondary and unspecified malignant neoplasm of intrathoracic lymph nodes: Secondary | ICD-10-CM | POA: Diagnosis not present

## 2021-01-19 DIAGNOSIS — Z5189 Encounter for other specified aftercare: Secondary | ICD-10-CM | POA: Diagnosis not present

## 2021-01-19 DIAGNOSIS — C3432 Malignant neoplasm of lower lobe, left bronchus or lung: Secondary | ICD-10-CM

## 2021-01-19 DIAGNOSIS — Z79899 Other long term (current) drug therapy: Secondary | ICD-10-CM | POA: Diagnosis not present

## 2021-01-19 DIAGNOSIS — T451X5A Adverse effect of antineoplastic and immunosuppressive drugs, initial encounter: Secondary | ICD-10-CM | POA: Diagnosis not present

## 2021-01-19 DIAGNOSIS — Z5111 Encounter for antineoplastic chemotherapy: Secondary | ICD-10-CM | POA: Diagnosis not present

## 2021-01-19 MED ORDER — SODIUM CHLORIDE 0.9 % IV SOLN: Freq: Once | INTRAVENOUS | Status: AC

## 2021-01-19 MED ORDER — SODIUM CHLORIDE 0.9 % IV SOLN
10.0000 mg | Freq: Once | INTRAVENOUS | Status: AC
  Administered 2021-01-19: 10 mg via INTRAVENOUS
  Filled 2021-01-19: qty 10

## 2021-01-19 MED ORDER — SODIUM CHLORIDE 0.9 % IV SOLN
80.0000 mg/m2 | Freq: Once | INTRAVENOUS | Status: AC
  Administered 2021-01-19: 160 mg via INTRAVENOUS
  Filled 2021-01-19: qty 8

## 2021-01-19 NOTE — Patient Instructions (Signed)
[facility]  Discharge Instructions: Thank you for choosing [facility] to provide your oncology and hematology care.   If you have a lab appointment with the [facility], please go directly to the [facility] and check in at the registration area.   Wear comfortable clothing and clothing appropriate for easy access to any Portacath or PICC line.   We strive to give you quality time with your provider. You may need to reschedule your appointment if you arrive late (15 or more minutes).  Arriving late affects you and other patients whose appointments are after yours.  Also, if you miss three or more appointments without notifying the office, you may be dismissed from the clinic at the providers discretion.      For prescription refill requests, have your pharmacy contact our office and allow 72 hours for refills to be completed.    Today you received the following chemotherapy and/or immunotherapy agents Etoposide      To help prevent nausea and vomiting after your treatment, we encourage you to take your nausea medication as directed.  BELOW ARE SYMPTOMS THAT SHOULD BE REPORTED IMMEDIATELY: *FEVER GREATER THAN 100.4 F (38 C) OR HIGHER *CHILLS OR SWEATING *NAUSEA AND VOMITING THAT IS NOT CONTROLLED WITH YOUR NAUSEA MEDICATION *UNUSUAL SHORTNESS OF BREATH *UNUSUAL BRUISING OR BLEEDING *URINARY PROBLEMS (pain or burning when urinating, or frequent urination) *BOWEL PROBLEMS (unusual diarrhea, constipation, pain near the anus) TENDERNESS IN MOUTH AND THROAT WITH OR WITHOUT PRESENCE OF ULCERS (sore throat, sores in mouth, or a toothache) UNUSUAL RASH, SWELLING OR PAIN  UNUSUAL VAGINAL DISCHARGE OR ITCHING   Items with * indicate a potential emergency and should be followed up as soon as possible or go to the Emergency Department if any problems should occur.  Please show the CHEMOTHERAPY ALERT CARD or IMMUNOTHERAPY ALERT CARD at check-in to  the Emergency Department and triage nurse.  Should you have questions after your visit or need to cancel or reschedule your appointment, please contact [facility]  Dept: [phone]  and follow the prompts.  Office hours are 8:00 a.m. to 4:30 p.m. Monday - Friday. Please note that voicemails left after 4:00 p.m. may not be returned until the following business day.  We are closed weekends and major holidays. You have access to a nurse at all times for urgent questions. Please call the main number to the clinic Dept: [phone] and follow the prompts.   For any non-urgent questions, you may also contact your provider using MyChart. We now offer e-Visits for anyone 50 and older to request care online for non-urgent symptoms. For details visit [email or web address].   Also download the MyChart app! Go to the app store, search "MyChart", open the app, select , and log in with your MyChart username and password.  Due to Covid, a mask is required upon entering the hospital/clinic. If you do not have a mask, one will be given to you upon arrival. For doctor visits, patients may have 1 support person aged 81 or older with them. For treatment visits, patients cannot have anyone with them due to current Covid guidelines and our immunocompromised population.

## 2021-01-23 ENCOUNTER — Other Ambulatory Visit: Payer: Medicare HMO

## 2021-01-23 ENCOUNTER — Ambulatory Visit: Payer: Medicare HMO

## 2021-01-23 ENCOUNTER — Ambulatory Visit: Payer: Medicare HMO | Admitting: Physician Assistant

## 2021-01-24 ENCOUNTER — Other Ambulatory Visit: Payer: Self-pay

## 2021-01-24 ENCOUNTER — Encounter: Payer: Self-pay | Admitting: Cardiology

## 2021-01-24 ENCOUNTER — Ambulatory Visit: Payer: Medicare HMO | Admitting: Cardiology

## 2021-01-24 ENCOUNTER — Ambulatory Visit: Payer: Medicare HMO

## 2021-01-24 VITALS — BP 106/68 | HR 94 | Ht 66.0 in | Wt 184.2 lb

## 2021-01-24 DIAGNOSIS — I10 Essential (primary) hypertension: Secondary | ICD-10-CM

## 2021-01-24 DIAGNOSIS — I4819 Other persistent atrial fibrillation: Secondary | ICD-10-CM | POA: Diagnosis not present

## 2021-01-24 DIAGNOSIS — I471 Supraventricular tachycardia: Secondary | ICD-10-CM | POA: Diagnosis not present

## 2021-01-24 NOTE — Patient Instructions (Addendum)
Medication Instructions:  Your physician recommends that you continue on your current medications as directed. Please refer to the Current Medication list given to you today.  *If you need a refill on your cardiac medications before your next appointment, please call your pharmacy*   Lab Work: None ordered   Testing/Procedures: None ordered   Follow-Up: At [facility], you and your health needs are our priority.  As part of our continuing mission to provide you with exceptional heart care, we have created designated Provider Care Teams.  These Care Teams include your primary Cardiologist (physician) and Advanced Practice Providers (APPs -  Physician Assistants and Nurse Practitioners) who all work together to provide you with the care you need, when you need it.  Your next appointment:   6 weeks  The format for your next appointment:   In Person  Provider:   Tommye Standard, PA-C    Thank you for choosing [facility]!!   Trinidad Curet, RN [phone]

## 2021-01-24 NOTE — Progress Notes (Signed)
Electrophysiology Office Note   Date:  01/24/2021   ID:  Patricia, [name] May 10, 1950, MRN [id number]  PCP:  [name], MD  Cardiologist:   Primary Electrophysiologist:  Zenia Guest Meredith Leeds, MD    Chief Complaint: SVT   History of Present Illness: Patricia [name] is a 71 y.o. female who is being seen today for the evaluation of SVT at the request of [name], MD. Presenting today for electrophysiology evaluation.  Is a history significant for hypertension, hyperlipidemia, TIA, IBS, SVT.  She wore a cardiac monitor that showed episodes episodes of SVT.  She went to the emergency room and treated with adenosine.  She tried vagal maneuvers.  She also wore a cardiac monitor that showed a 2% atrial fibrillation burden and was started on Eliquis.  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  She was started on chemotherapy.  She was readmitted to the hospital 12/26/2020 with worsening esophageal pain due to esophagitis felt from XRT.  She also had progressive weakness and shortness of breath.  She went into atrial fibrillation with rapid rates.  She was found to be anemic and thrombocytopenic.  Anticoagulation was stopped and thus antiarrhythmic medications were stopped.  She was transferred to [facility] for XRT and cardiology followed her there.  She was discharged from the hospital on diltiazem and metoprolol.  Today, denies symptoms of palpitations, chest pain, shortness of breath, orthopnea, PND, lower extremity edema, claudication, dizziness, presyncope, syncope, bleeding, or neurologic sequela. The patient is tolerating medications without difficulties.  She continues to feel weak and fatigued.  She is fortunately done with chest radiation and chemotherapy.  She is planned for 10 cycles of whole brain radiation for prophylaxis.  She has been able to restart  her Eliquis.  She has not noted any significantly rapid rates.   Past Medical History:  Diagnosis Date   Anxiety    GERD (gastroesophageal reflux disease)    Hyperlipidemia    Hypertension    IBS (irritable bowel syndrome)    Insomnia    Low back pain    Lung cancer ([place]) 10/2020   TIA (transient ischemic attack) 10/2017   no deficits   Vitamin B12 deficiency    Vitamin D deficiency    Past Surgical History:  Procedure Laterality Date   BACK SURGERY     BREAST EXCISIONAL BIOPSY Left 1997   benign   BRONCHIAL BIOPSY  10/18/2020   Procedure: BRONCHIAL BIOPSIES;  Surgeon: [name], DO;  Location: [facility];  Service: Pulmonary;;   BRONCHIAL BRUSHINGS  10/18/2020   Procedure: BRONCHIAL BRUSHINGS;  Surgeon: [name], DO;  Location: [place];  Service: Pulmonary;;   BRONCHIAL NEEDLE ASPIRATION BIOPSY  10/18/2020   Procedure: BRONCHIAL NEEDLE ASPIRATION BIOPSIES;  Surgeon: [name], DO;  Location: [place] [facility];  Service: Pulmonary;;   BRONCHIAL WASHINGS  10/18/2020   Procedure: BRONCHIAL WASHINGS;  Surgeon: [name], DO;  Location: [facility];  Service: Pulmonary;;   hemorrhoidecotmy     HERNIA MESH REMOVAL     OPEN REDUCTION INTERNAL FIXATION (ORIF) DISTAL RADIAL FRACTURE Right 11/19/2019   Procedure: OPEN REDUCTION INTERNAL FIXATION (ORIF) DISTAL RADIUS AND ULNA FRACTURE WITH REPAIR AS NECESSARY;  Surgeon: Roseanne Kaufman, MD;  Location: [facility];  Service: Orthopedics;  Laterality: Right;  2 hrs Block with IV sedation   ORIF RADIUS & ULNA  FRACTURES     TONSILLECTOMY     VIDEO BRONCHOSCOPY WITH ENDOBRONCHIAL NAVIGATION Left 10/18/2020   Procedure: VIDEO BRONCHOSCOPY WITH ENDOBRONCHIAL NAVIGATION;  Surgeon: [name], DO;  Location: [facility] [facility];  Service: Pulmonary;  Laterality: Left;  ION   VIDEO BRONCHOSCOPY WITH ENDOBRONCHIAL ULTRASOUND Bilateral 10/18/2020   Procedure: VIDEO BRONCHOSCOPY WITH ENDOBRONCHIAL ULTRASOUND;  Surgeon:  [name], DO;  Location: [facility];  Service: Pulmonary;  Laterality: Bilateral;   VIDEO BRONCHOSCOPY WITH RADIAL ENDOBRONCHIAL ULTRASOUND  10/18/2020   Procedure: VIDEO BRONCHOSCOPY WITH RADIAL ENDOBRONCHIAL ULTRASOUND;  Surgeon: [name], DO;  Location: [facility];  Service: Pulmonary;;     Current Outpatient Medications  Medication Sig Dispense Refill   carboxymethylcellulose (REFRESH PLUS) 0.5 % SOLN Place 1 drop into both eyes 3 (three) times daily as needed (for dryness).      carisoprodol (SOMA) 350 MG tablet Take 350 mg by mouth 3 (three) times daily.   5   Cholecalciferol (VITAMIN D3) 50 MCG (2000 UT) TABS Take 2,000 Units by mouth daily.     clobetasol ointment (TEMOVATE) 0.16 % Apply 1 application topically See admin instructions. Apply to vaginal area daily as directed     clonazePAM (KLONOPIN) 1 MG tablet Take 1 mg by mouth at bedtime.   3   diltiazem (CARDIZEM CD) 360 MG 24 hr capsule Take 1 capsule (360 mg total) by mouth daily. 90 capsule 2   docusate sodium (COLACE) 100 MG capsule Take 1 capsule (100 mg total) by mouth 2 (two) times daily. (Patient taking differently: Take 100 mg by mouth as needed for mild constipation or moderate constipation.) 10 capsule 0   ELIQUIS 5 MG TABS tablet TAKE 1 TABLET(5 MG) BY MOUTH TWICE DAILY 60 tablet 6   escitalopram (LEXAPRO) 10 MG tablet Take 1 tablet (10 mg total) by mouth daily. 30 tablet 2   fluticasone (FLONASE) 50 MCG/ACT nasal spray Place 2 sprays into both nostrils daily as needed for allergies or rhinitis.   5   furosemide (LASIX) 20 MG tablet Take 1 tablet (20 mg total) by mouth daily. 30 tablet 2   gabapentin (NEURONTIN) 600 MG tablet Take 600 mg by mouth See admin instructions. Take 600 mg by mouth in the morning and at lunchtime     guaiFENesin-dextromethorphan (ROBITUSSIN DM) 100-10 MG/5ML syrup Take 10 mLs by mouth every 4 (four) hours as needed for cough. 118 mL 0   HYDROcodone-acetaminophen (NORCO)  7.5-325 MG tablet Take 1 tablet by mouth every 6 (six) hours as needed for severe pain. 15 tablet 0   ipratropium (ATROVENT) 0.02 % nebulizer solution 3 (three) times daily.     levalbuterol (XOPENEX) 1.25 MG/3ML nebulizer solution Inhale into the lungs.     lidocaine (XYLOCAINE) 2 % solution Use as directed 15 mLs in the mouth or throat every 6 (six) hours as needed for mouth pain. (Patient not taking: Reported on 01/11/2021) 100 mL 0   linaclotide (LINZESS) 145 MCG CAPS capsule Take 1 capsule (145 mcg total) by mouth daily at 4 PM. (Patient taking differently: Take 145 mcg by mouth as needed.) 30 capsule 0   magic mouthwash SOLN Take 5 mLs by mouth 4 (four) times daily as needed for mouth pain. Pt is allergic to Magnesium (Patient not taking: Reported on 12/25/2020) 240 mL 1   magnesium oxide (MAG-OX) 400 (240 Mg) MG tablet Take 1 tablet (400 mg total) by mouth daily. 30 tablet 1   Metoprolol Tartrate 75 MG TABS Take  75 mg by mouth every 8 (eight) hours. 90 tablet 2   nystatin (MYCOSTATIN) 100000 UNIT/ML suspension Take 5 mLs (500,000 Units total) by mouth 4 (four) times daily. 440 mL 5   omeprazole (PRILOSEC) 20 MG capsule Take 20 mg by mouth daily before breakfast.      ondansetron (ZOFRAN-ODT) 8 MG disintegrating tablet Take 8 mg by mouth as needed.     polyethylene glycol (MIRALAX / GLYCOLAX) packet Take 34 g by mouth in the morning.      prochlorperazine (COMPAZINE) 10 MG tablet Take 1 tablet (10 mg total) by mouth every 6 (six) hours as needed for nausea or vomiting. (Patient not taking: Reported on 01/11/2021) 30 tablet 0   rosuvastatin (CRESTOR) 5 MG tablet Take 5 mg by mouth 3 (three) times a week. Mondays Wednesdays Fridays     sodium chloride 1 g tablet Take 1 g by mouth 3 (three) times daily.     traMADol (ULTRAM) 50 MG tablet Take 100 mg by mouth in the morning, at noon, and at bedtime.     zaleplon (SONATA) 10 MG capsule Take 10 mg by mouth at bedtime as needed (for interrupted sleep).       No current facility-administered medications for this visit.    Allergies:   Augmentin [amoxicillin-pot clavulanate], Levaquin [levofloxacin in d5w], Magnesium hydroxide, Oxycodone, and Other   Social History:  The patient  reports that she quit smoking about 13 months ago. Her smoking use included cigarettes. She started smoking about 51 years ago. She has a 50.00 pack-year smoking history. She has never used smokeless tobacco. She reports that she does not currently use alcohol. She reports that she does not use drugs.   Family History:  The patient's family history includes Breast cancer in her maternal aunt, paternal aunt, and sister.   ROS:  Please see the history of present illness.   Otherwise, review of systems is positive for none.   All other systems are reviewed and negative.   PHYSICAL EXAM: VS:  Ht 5\' 6"  (1.676 m)    LMP  (LMP Unknown)    BMI 30.20 kg/m  , BMI Body mass index is 30.2 kg/m. GEN: Well nourished, well developed, in no acute distress  HEENT: normal  Neck: no JVD, carotid bruits, or masses Cardiac: irregular; no murmurs, rubs, or gallops,no edema  Respiratory:  clear to auscultation bilaterally, normal work of breathing GI: soft, nontender, nondistended, + BS MS: no deformity or atrophy  Skin: warm and dry Neuro:  Strength and sensation are intact Psych: euthymic mood, full affect  EKG:  EKG is not ordered today. Personal review of the ekg ordered 01/06/21 shows sinus tachycardia, rate 104  Recent Labs: 10/14/2020: TSH 2.021 01/17/2021: ALT 20; BUN 16; Creatinine 0.81; Hemoglobin 9.3; Magnesium 1.3; Platelet Count 106; Potassium 4.0; Sodium 138    Lipid Panel  No results found for: CHOL, TRIG, HDL, CHOLHDL, VLDL, LDLCALC, LDLDIRECT   Wt Readings from Last 3 Encounters:  01/10/21 187 lb 1.6 oz (84.9 kg)  01/06/21 186 lb (84.4 kg)  12/26/20 204 lb 9.4 oz (92.8 kg)      Other studies Reviewed: Additional studies/ records that were reviewed today  include: TTE 2019  Review of the above records today demonstrates:  - Left ventricle: The cavity size was normal. Systolic function was    normal. The estimated ejection fraction was in the range of 55%    to 60%. Wall motion was normal; there were no regional wall  motion abnormalities. Doppler parameters are consistent with    abnormal left ventricular relaxation (grade 1 diastolic    dysfunction). There was no evidence of elevated ventricular    filling pressure by Doppler parameters.  - Aortic valve: There was no regurgitation.  - Mitral valve: There was trivial regurgitation.  - Right ventricle: The cavity size was normal. Wall thickness was    normal. Systolic function was normal.  - Tricuspid valve: There was mild regurgitation.  - Pulmonary arteries: Systolic pressure was within the normal    range.  - Inferior vena cava: The vessel was normal in size.  - Pericardium, extracardiac: There was no pericardial effusion.   Monitor 01/04/20 Max 234 bpm 02:47pm, 12/01 Min 46 bpm 11:11pm, 12/07 Avg 76 bpm Less than 1% ventricular and supraventricular ectopy The predominant underlying rhythm was sinus rhythm 2% atrial fibrillation burden with heart rates of 107 to 234 bpm Triggered events associated with both atrial fibrillation and SVT.   ASSESSMENT AND PLAN:  1.  SVT: Likely due to an atrial tachycardia.  Currently on diltiazem and metoprolol.  Continue with current management.  2.  Persistent atrial fibrillation/atrial flutter: CHA2DS2-VASc of 5.  Currently on Eliquis 5 mg twice daily.  She feels weak and fatigued, but this could be a combination of issues.  She is a regular on exam today.  I do think that she is back in atrial fibrillation.  She has more radiation upcoming.  I Shya Kovatch have her see our EP app in 6 weeks with an ECG at that time.  We Trenisha Lafavor likely plan to restart antiarrhythmic medications and plan for a rhythm control strategy depending on how she is feeling.  3.   Hypertension: Currently well controlled  4.  Small cell lung cancer: Currently undergoing chemotherapy and radiation with Dr. Earlie Server.   Current medicines are reviewed at length with the patient today.   The patient does not have concerns regarding her medicines.  The following changes were made today: None  Labs/ tests ordered today include:  No orders of the defined types were placed in this encounter.     Disposition:   FU with [facility] Standard 6 weeks  Signed, Tali Coster Meredith Leeds, MD  01/24/2021 4:17 PM     [place] [address] [address] [phone] (office) [phone] (fax)

## 2021-01-25 ENCOUNTER — Ambulatory Visit: Payer: Medicare HMO

## 2021-01-25 ENCOUNTER — Inpatient Hospital Stay: Payer: Medicare HMO

## 2021-01-25 DIAGNOSIS — D61810 Antineoplastic chemotherapy induced pancytopenia: Secondary | ICD-10-CM | POA: Diagnosis not present

## 2021-01-25 DIAGNOSIS — C3432 Malignant neoplasm of lower lobe, left bronchus or lung: Secondary | ICD-10-CM | POA: Diagnosis not present

## 2021-01-25 DIAGNOSIS — C771 Secondary and unspecified malignant neoplasm of intrathoracic lymph nodes: Secondary | ICD-10-CM | POA: Diagnosis not present

## 2021-01-25 DIAGNOSIS — Z5189 Encounter for other specified aftercare: Secondary | ICD-10-CM | POA: Diagnosis not present

## 2021-01-25 DIAGNOSIS — T451X5A Adverse effect of antineoplastic and immunosuppressive drugs, initial encounter: Secondary | ICD-10-CM | POA: Diagnosis not present

## 2021-01-25 DIAGNOSIS — D649 Anemia, unspecified: Secondary | ICD-10-CM

## 2021-01-25 DIAGNOSIS — Z79899 Other long term (current) drug therapy: Secondary | ICD-10-CM | POA: Diagnosis not present

## 2021-01-25 DIAGNOSIS — Z5111 Encounter for antineoplastic chemotherapy: Secondary | ICD-10-CM | POA: Diagnosis not present

## 2021-01-25 LAB — CBC WITH DIFFERENTIAL (CANCER CENTER ONLY)
Abs Immature Granulocytes: 0.04 10*3/uL (ref 0.00–0.07)
Basophils Absolute: 0 10*3/uL (ref 0.0–0.1)
Basophils Relative: 1 %
Eosinophils Absolute: 0 10*3/uL (ref 0.0–0.5)
Eosinophils Relative: 1 %
HCT: 24.1 % — ABNORMAL LOW (ref 36.0–46.0)
Hemoglobin: 8.2 g/dL — ABNORMAL LOW (ref 12.0–15.0)
Immature Granulocytes: 2 %
Lymphocytes Relative: 14 %
Lymphs Abs: 0.2 10*3/uL — ABNORMAL LOW (ref 0.7–4.0)
MCH: 34 pg (ref 26.0–34.0)
MCHC: 34 g/dL (ref 30.0–36.0)
MCV: 100 fL (ref 80.0–100.0)
Monocytes Absolute: 0 10*3/uL — ABNORMAL LOW (ref 0.1–1.0)
Monocytes Relative: 2 %
Neutro Abs: 1.3 10*3/uL — ABNORMAL LOW (ref 1.7–7.7)
Neutrophils Relative %: 80 %
Platelet Count: 54 10*3/uL — ABNORMAL LOW (ref 150–400)
RBC: 2.41 MIL/uL — ABNORMAL LOW (ref 3.87–5.11)
RDW: 22.4 % — ABNORMAL HIGH (ref 11.5–15.5)
WBC Count: 1.7 10*3/uL — ABNORMAL LOW (ref 4.0–10.5)
nRBC: 0 % (ref 0.0–0.2)

## 2021-01-25 LAB — CMP (CANCER CENTER ONLY)
ALT: 30 U/L (ref 0–44)
AST: 22 U/L (ref 15–41)
Albumin: 3.4 g/dL — ABNORMAL LOW (ref 3.5–5.0)
Alkaline Phosphatase: 75 U/L (ref 38–126)
Anion gap: 8 (ref 5–15)
BUN: 24 mg/dL — ABNORMAL HIGH (ref 8–23)
CO2: 33 mmol/L — ABNORMAL HIGH (ref 22–32)
Calcium: 8.3 mg/dL — ABNORMAL LOW (ref 8.9–10.3)
Chloride: 94 mmol/L — ABNORMAL LOW (ref 98–111)
Creatinine: 0.79 mg/dL (ref 0.44–1.00)
GFR, Estimated: >60 mL/min (ref >60)
Glucose, Bld: 104 mg/dL — ABNORMAL HIGH (ref 70–99)
Potassium: 3.7 mmol/L (ref 3.5–5.1)
Sodium: 135 mmol/L (ref 135–145)
Total Bilirubin: 0.6 mg/dL (ref 0.3–1.2)
Total Protein: 5.9 g/dL — ABNORMAL LOW (ref 6.5–8.1)

## 2021-01-25 LAB — SAMPLE TO BLOOD BANK

## 2021-01-25 LAB — MAGNESIUM: Magnesium: 1.2 mg/dL — ABNORMAL LOW (ref 1.7–2.4)

## 2021-02-01 ENCOUNTER — Other Ambulatory Visit: Payer: Self-pay | Admitting: Medical Oncology

## 2021-02-01 ENCOUNTER — Telehealth: Payer: Self-pay | Admitting: Medical Oncology

## 2021-02-01 ENCOUNTER — Other Ambulatory Visit: Payer: Self-pay | Admitting: Internal Medicine

## 2021-02-01 ENCOUNTER — Inpatient Hospital Stay: Payer: Medicare HMO

## 2021-02-01 ENCOUNTER — Other Ambulatory Visit: Payer: Self-pay

## 2021-02-01 ENCOUNTER — Other Ambulatory Visit: Payer: Self-pay | Admitting: Pharmacist

## 2021-02-01 ENCOUNTER — Other Ambulatory Visit: Payer: Self-pay | Admitting: Physician Assistant

## 2021-02-01 DIAGNOSIS — Z5111 Encounter for antineoplastic chemotherapy: Secondary | ICD-10-CM | POA: Diagnosis not present

## 2021-02-01 DIAGNOSIS — D701 Agranulocytosis secondary to cancer chemotherapy: Secondary | ICD-10-CM

## 2021-02-01 DIAGNOSIS — Z5189 Encounter for other specified aftercare: Secondary | ICD-10-CM | POA: Diagnosis not present

## 2021-02-01 DIAGNOSIS — C771 Secondary and unspecified malignant neoplasm of intrathoracic lymph nodes: Secondary | ICD-10-CM | POA: Diagnosis not present

## 2021-02-01 DIAGNOSIS — C3432 Malignant neoplasm of lower lobe, left bronchus or lung: Secondary | ICD-10-CM | POA: Diagnosis not present

## 2021-02-01 DIAGNOSIS — D61810 Antineoplastic chemotherapy induced pancytopenia: Secondary | ICD-10-CM | POA: Diagnosis not present

## 2021-02-01 DIAGNOSIS — T451X5A Adverse effect of antineoplastic and immunosuppressive drugs, initial encounter: Secondary | ICD-10-CM | POA: Diagnosis not present

## 2021-02-01 DIAGNOSIS — Z79899 Other long term (current) drug therapy: Secondary | ICD-10-CM | POA: Diagnosis not present

## 2021-02-01 LAB — CBC WITH DIFFERENTIAL (CANCER CENTER ONLY)
Abs Immature Granulocytes: 0 10*3/uL (ref 0.00–0.07)
Basophils Absolute: 0 10*3/uL (ref 0.0–0.1)
Basophils Relative: 0 %
Eosinophils Absolute: 0 10*3/uL (ref 0.0–0.5)
Eosinophils Relative: 0 %
HCT: 21.7 % — ABNORMAL LOW (ref 36.0–46.0)
Hemoglobin: 7.4 g/dL — ABNORMAL LOW (ref 12.0–15.0)
Immature Granulocytes: 0 %
Lymphocytes Relative: 60 %
Lymphs Abs: 0.2 10*3/uL — ABNORMAL LOW (ref 0.7–4.0)
MCH: 34.7 pg — ABNORMAL HIGH (ref 26.0–34.0)
MCHC: 34.1 g/dL (ref 30.0–36.0)
MCV: 101.9 fL — ABNORMAL HIGH (ref 80.0–100.0)
Monocytes Absolute: 0.1 10*3/uL (ref 0.1–1.0)
Monocytes Relative: 17 %
Neutro Abs: 0.1 10*3/uL — CL (ref 1.7–7.7)
Neutrophils Relative %: 23 %
Platelet Count: 21 10*3/uL — ABNORMAL LOW (ref 150–400)
RBC: 2.13 MIL/uL — ABNORMAL LOW (ref 3.87–5.11)
RDW: 22.7 % — ABNORMAL HIGH (ref 11.5–15.5)
Smear Review: NORMAL
WBC Count: 0.4 10*3/uL — CL (ref 4.0–10.5)
nRBC: 0 % (ref 0.0–0.2)

## 2021-02-01 LAB — CMP (CANCER CENTER ONLY)
ALT: 14 U/L (ref 0–44)
AST: 14 U/L — ABNORMAL LOW (ref 15–41)
Albumin: 3.6 g/dL (ref 3.5–5.0)
Alkaline Phosphatase: 80 U/L (ref 38–126)
Anion gap: 7 (ref 5–15)
BUN: 22 mg/dL (ref 8–23)
CO2: 32 mmol/L (ref 22–32)
Calcium: 7.6 mg/dL — ABNORMAL LOW (ref 8.9–10.3)
Chloride: 100 mmol/L (ref 98–111)
Creatinine: 0.88 mg/dL (ref 0.44–1.00)
GFR, Estimated: >60 mL/min (ref >60)
Glucose, Bld: 103 mg/dL — ABNORMAL HIGH (ref 70–99)
Potassium: 4.3 mmol/L (ref 3.5–5.1)
Sodium: 139 mmol/L (ref 135–145)
Total Bilirubin: 0.4 mg/dL (ref 0.3–1.2)
Total Protein: 5.9 g/dL — ABNORMAL LOW (ref 6.5–8.1)

## 2021-02-01 LAB — MAGNESIUM: Magnesium: 1 mg/dL — ABNORMAL LOW (ref 1.7–2.4)

## 2021-02-01 MED ORDER — FILGRASTIM-AAFI 300 MCG/0.5ML IJ SOSY
300.0000 ug | PREFILLED_SYRINGE | Freq: Once | INTRAMUSCULAR | Status: AC
  Administered 2021-02-01: 17:00:00 300 ug via SUBCUTANEOUS
  Filled 2021-02-01: qty 0.5

## 2021-02-01 NOTE — Telephone Encounter (Signed)
LVM for husband or pt to call me back regarding pt wife blood cell count.  Per Julien Nordmann ,she will need granix x 3 days  Spoke to pt and she can be here by 5 pm today.

## 2021-02-02 ENCOUNTER — Inpatient Hospital Stay: Payer: Medicare HMO

## 2021-02-02 ENCOUNTER — Telehealth: Payer: Self-pay | Admitting: Medical Oncology

## 2021-02-02 VITALS — BP 95/61 | HR 75 | Temp 99.1°F | Resp 20

## 2021-02-02 DIAGNOSIS — Z5189 Encounter for other specified aftercare: Secondary | ICD-10-CM | POA: Diagnosis not present

## 2021-02-02 DIAGNOSIS — C3432 Malignant neoplasm of lower lobe, left bronchus or lung: Secondary | ICD-10-CM | POA: Diagnosis not present

## 2021-02-02 DIAGNOSIS — D61810 Antineoplastic chemotherapy induced pancytopenia: Secondary | ICD-10-CM | POA: Diagnosis not present

## 2021-02-02 DIAGNOSIS — T451X5A Adverse effect of antineoplastic and immunosuppressive drugs, initial encounter: Secondary | ICD-10-CM | POA: Diagnosis not present

## 2021-02-02 DIAGNOSIS — Z5111 Encounter for antineoplastic chemotherapy: Secondary | ICD-10-CM | POA: Diagnosis not present

## 2021-02-02 DIAGNOSIS — C771 Secondary and unspecified malignant neoplasm of intrathoracic lymph nodes: Secondary | ICD-10-CM | POA: Diagnosis not present

## 2021-02-02 DIAGNOSIS — D701 Agranulocytosis secondary to cancer chemotherapy: Secondary | ICD-10-CM

## 2021-02-02 DIAGNOSIS — Z79899 Other long term (current) drug therapy: Secondary | ICD-10-CM | POA: Diagnosis not present

## 2021-02-02 MED ORDER — FILGRASTIM-AAFI 300 MCG/0.5ML IJ SOSY
300.0000 ug | PREFILLED_SYRINGE | Freq: Once | INTRAMUSCULAR | Status: AC
  Administered 2021-02-02: 300 ug via SUBCUTANEOUS
  Filled 2021-02-02: qty 0.5

## 2021-02-02 NOTE — Telephone Encounter (Signed)
Pt notified and schedule message sent for IV mag.

## 2021-02-02 NOTE — Telephone Encounter (Signed)
-----   Message from Curt Bears, MD sent at 02/01/2021  8:01 PM EST ----- Please arrange for the patient to receive 4 g of magnesium sulfate either on Thursday or Friday.  She will also need to continue with the magnesium oxide at home at least 3-4 times a day.  Thank you. ----- Message ----- From: Buel Ream, [facility] [place] Sent: 02/01/2021   1:57 PM EST To: Curt Bears, MD

## 2021-02-03 ENCOUNTER — Other Ambulatory Visit: Payer: Self-pay

## 2021-02-03 ENCOUNTER — Other Ambulatory Visit: Payer: Self-pay | Admitting: Hematology and Oncology

## 2021-02-03 ENCOUNTER — Inpatient Hospital Stay: Payer: Medicare HMO

## 2021-02-03 VITALS — BP 112/75 | HR 120 | Temp 98.7°F | Resp 18

## 2021-02-03 DIAGNOSIS — Z79899 Other long term (current) drug therapy: Secondary | ICD-10-CM | POA: Diagnosis not present

## 2021-02-03 DIAGNOSIS — Z5189 Encounter for other specified aftercare: Secondary | ICD-10-CM | POA: Diagnosis not present

## 2021-02-03 DIAGNOSIS — C771 Secondary and unspecified malignant neoplasm of intrathoracic lymph nodes: Secondary | ICD-10-CM | POA: Diagnosis not present

## 2021-02-03 DIAGNOSIS — I4891 Unspecified atrial fibrillation: Secondary | ICD-10-CM | POA: Diagnosis not present

## 2021-02-03 DIAGNOSIS — T451X5A Adverse effect of antineoplastic and immunosuppressive drugs, initial encounter: Secondary | ICD-10-CM | POA: Diagnosis not present

## 2021-02-03 DIAGNOSIS — D701 Agranulocytosis secondary to cancer chemotherapy: Secondary | ICD-10-CM

## 2021-02-03 DIAGNOSIS — Z5111 Encounter for antineoplastic chemotherapy: Secondary | ICD-10-CM | POA: Diagnosis not present

## 2021-02-03 DIAGNOSIS — C3432 Malignant neoplasm of lower lobe, left bronchus or lung: Secondary | ICD-10-CM | POA: Diagnosis not present

## 2021-02-03 DIAGNOSIS — D61810 Antineoplastic chemotherapy induced pancytopenia: Secondary | ICD-10-CM | POA: Diagnosis not present

## 2021-02-03 MED ORDER — MAGNESIUM SULFATE 4 GM/100ML IV SOLN
4.0000 g | Freq: Once | INTRAVENOUS | Status: AC
  Administered 2021-02-03: 4 g via INTRAVENOUS
  Filled 2021-02-03: qty 100

## 2021-02-03 MED ORDER — FILGRASTIM-AAFI 300 MCG/0.5ML IJ SOSY
300.0000 ug | PREFILLED_SYRINGE | Freq: Once | INTRAMUSCULAR | Status: AC
  Administered 2021-02-03: 300 ug via SUBCUTANEOUS
  Filled 2021-02-03: qty 0.5

## 2021-02-03 MED ORDER — MAGNESIUM SULFATE 4 GM/100ML IV SOLN
4.0000 g | Freq: Once | INTRAVENOUS | Status: DC
  Filled 2021-02-03: qty 100

## 2021-02-03 NOTE — Patient Instructions (Addendum)
Hypomagnesemia Hypomagnesemia is a condition in which the level of magnesium in the blood is too low. Magnesium is a mineral that is found in many foods. It is used in many different processes in the body. Hypomagnesemia can affect every organ in the body. In severe cases, it can cause life-threatening problems. What are the causes? This condition may be caused by: Not getting enough magnesium in your diet or not having enough healthy foods to eat (malnutrition). Problems with magnesium absorption in the intestines. Dehydration. Excessive use of alcohol. Vomiting. Severe or long-term (chronic) diarrhea. Some medicines, including medicines that make you urinate more often (diuretics). Certain diseases, such as kidney disease, diabetes, celiac disease, and overactive thyroid. What are the signs or symptoms? Symptoms of this condition include: Loss of appetite, nausea, and vomiting. Involuntary shaking or trembling of a body part (tremor). Muscle weakness or tingling in the arms and legs. Sudden tightening of muscles (muscle spasms). Confusion. Psychiatric issues, such as: Depression and irritability. Psychosis. A feeling of fluttering of the heart (palpitations). Seizures. These symptoms are more severe if magnesium levels drop suddenly. How is this diagnosed? This condition may be diagnosed based on: Your symptoms and medical history. A physical exam. Blood and urine tests. How is this treated? Treatment depends on the cause and the severity of the condition. It may be treated by: Taking a magnesium supplement. This can be taken in pill form. If the condition is severe, magnesium is usually given through an IV. Making changes to your diet. You may be directed to eat foods that have a lot of magnesium, such as green leafy vegetables, peas, beans, and nuts. Not drinking alcohol. If you are struggling not to drink, ask your health care provider for help. Follow these instructions at  home: Eating and drinking   Make sure that your diet includes foods with magnesium. Foods that have a lot of magnesium in them include: Green leafy vegetables, such as spinach and broccoli. Beans and peas. Nuts and seeds, such as almonds and sunflower seeds. Whole grains, such as whole grain bread and fortified cereals. Drink fluids that contain salts and minerals (electrolytes), such as sports drinks, when you are active. Do not drink alcohol. General instructions Take over-the-counter and prescription medicines only as told by your health care provider. Take magnesium supplements as directed if your health care provider tells you to take them. Have your magnesium levels monitored as told by your health care provider. Keep all follow-up visits. This is important. Contact a health care provider if: You get worse instead of better. Your symptoms return. Get help right away if: You develop severe muscle weakness. You have trouble breathing. You feel that your heart is racing. These symptoms may represent a serious problem that is an emergency. Do not wait to see if the symptoms will go away. Get medical help right away. Call your local emergency services (911 in the [place] Do not drive yourself to the hospital. Summary Hypomagnesemia is a condition in which the level of magnesium in the blood is too low. Hypomagnesemia can affect every organ in the body. Treatment may include eating more foods that contain magnesium, taking magnesium supplements, and not drinking alcohol. Have your magnesium levels monitored as told by your health care provider. This information is not intended to replace advice given to you by your health care provider. Make sure you discuss any questions you have with your health care provider. Document Revised: 05/24/2020 Document Reviewed: 05/24/2020 Elsevier Patient Education  2022  [place].  Filgrastim, G-CSF injection What is this medication? FILGRASTIM,  G-CSF (fil GRA stim) is a granulocyte colony-stimulating factor that stimulates the growth of neutrophils, a type of white blood cell (WBC) important in the body's fight against infection. It is used to reduce the incidence of fever and infection in patients with certain types of cancer who are receiving chemotherapy that affects the bone marrow, to stimulate blood cell production for removal of WBCs from the body prior to a bone marrow transplantation, to reduce the incidence of fever and infection in patients who have severe chronic neutropenia, and to improve survival outcomes following high-dose radiation exposure that is toxic to the bone marrow. This medicine may be used for other purposes; ask your health care provider or pharmacist if you have questions. COMMON BRAND NAME(S): Neupogen, Nivestym, Releuko, Zarxio What should I tell my care team before I take this medication? They need to know if you have any of these conditions: kidney disease latex allergy ongoing radiation therapy sickle cell disease an unusual or allergic reaction to filgrastim, pegfilgrastim, other medicines, foods, dyes, or preservatives pregnant or trying to get pregnant breast-feeding How should I use this medication? This medicine is for injection under the skin or infusion into a vein. As an infusion into a vein, it is usually given by a health care professional in a hospital or clinic setting. If you get this medicine at home, you will be taught how to prepare and give this medicine. Refer to the Instructions for Use that come with your medication packaging. Use exactly as directed. Take your medicine at regular intervals. Do not take your medicine more often than directed. It is important that you put your used needles and syringes in a special sharps container. Do not put them in a trash can. If you do not have a sharps container, call your pharmacist or healthcare provider to get one. Talk to your pediatrician  regarding the use of this medicine in children. While this drug may be prescribed for children as young as 7 months for selected conditions, precautions do apply. Overdosage: If you think you have taken too much of this medicine contact a poison control center or emergency room at once. NOTE: This medicine is only for you. Do not share this medicine with others. What if I miss a dose? It is important not to miss your dose. Call your doctor or health care professional if you miss a dose. What may interact with this medication? This medicine may interact with the following medications: medicines that may cause a release of neutrophils, such as lithium This list may not describe all possible interactions. Give your health care provider a list of all the medicines, herbs, non-prescription drugs, or dietary supplements you use. Also tell them if you smoke, drink alcohol, or use illegal drugs. Some items may interact with your medicine. What should I watch for while using this medication? Your condition will be monitored carefully while you are receiving this medicine. You may need blood work done while you are taking this medicine. Talk to your health care provider about your risk of cancer. You may be more at risk for certain types of cancer if you take this medicine. What side effects may I notice from receiving this medication? Side effects that you should report to your doctor or health care professional as soon as possible: allergic reactions like skin rash, itching or hives, swelling of the face, lips, or tongue back pain dizziness or feeling faint  fever pain, redness, or irritation at site where injected pinpoint red spots on the skin shortness of breath or breathing problems signs and symptoms of kidney injury like trouble passing urine, change in the amount of urine, or red or dark-brown urine stomach or side pain, or pain at the shoulder swelling tiredness unusual bleeding or  bruising Side effects that usually do not require medical attention (report to your doctor or health care professional if they continue or are bothersome): bone pain cough diarrhea hair loss headache muscle pain This list may not describe all possible side effects. Call your doctor for medical advice about side effects. You may report side effects to FDA at 1-800-FDA-1088. Where should I keep my medication? Keep out of the reach of children. Store in a refrigerator between 2 and 8 degrees C (36 and 46 degrees F). Do not freeze. Keep in carton to protect from light. Throw away this medicine if vials or syringes are left out of the refrigerator for more than 24 hours. Throw away any unused medicine after the expiration date. NOTE: This sheet is a summary. It may not cover all possible information. If you have questions about this medicine, talk to your doctor, pharmacist, or health care provider.  2022 Elsevier/Gold Standard (2020-09-13 00:00:00)

## 2021-02-07 ENCOUNTER — Other Ambulatory Visit: Payer: Self-pay

## 2021-02-07 DIAGNOSIS — C3432 Malignant neoplasm of lower lobe, left bronchus or lung: Secondary | ICD-10-CM

## 2021-02-08 ENCOUNTER — Inpatient Hospital Stay: Payer: Medicare HMO

## 2021-02-08 ENCOUNTER — Telehealth: Payer: Self-pay

## 2021-02-08 ENCOUNTER — Inpatient Hospital Stay: Payer: Medicare HMO | Admitting: Internal Medicine

## 2021-02-08 ENCOUNTER — Encounter: Payer: Self-pay | Admitting: Internal Medicine

## 2021-02-08 ENCOUNTER — Inpatient Hospital Stay: Payer: Medicare HMO | Attending: Internal Medicine

## 2021-02-08 ENCOUNTER — Other Ambulatory Visit: Payer: Self-pay

## 2021-02-08 VITALS — BP 99/75 | HR 133 | Temp 97.8°F | Resp 20 | Ht 66.0 in | Wt 186.6 lb

## 2021-02-08 DIAGNOSIS — I5043 Acute on chronic combined systolic (congestive) and diastolic (congestive) heart failure: Secondary | ICD-10-CM | POA: Diagnosis present

## 2021-02-08 DIAGNOSIS — I959 Hypotension, unspecified: Secondary | ICD-10-CM | POA: Diagnosis not present

## 2021-02-08 DIAGNOSIS — T451X5S Adverse effect of antineoplastic and immunosuppressive drugs, sequela: Secondary | ICD-10-CM

## 2021-02-08 DIAGNOSIS — E876 Hypokalemia: Secondary | ICD-10-CM | POA: Diagnosis not present

## 2021-02-08 DIAGNOSIS — Z79899 Other long term (current) drug therapy: Secondary | ICD-10-CM | POA: Insufficient documentation

## 2021-02-08 DIAGNOSIS — D701 Agranulocytosis secondary to cancer chemotherapy: Secondary | ICD-10-CM

## 2021-02-08 DIAGNOSIS — Z803 Family history of malignant neoplasm of breast: Secondary | ICD-10-CM

## 2021-02-08 DIAGNOSIS — J9811 Atelectasis: Secondary | ICD-10-CM | POA: Diagnosis not present

## 2021-02-08 DIAGNOSIS — I4892 Unspecified atrial flutter: Secondary | ICD-10-CM | POA: Diagnosis present

## 2021-02-08 DIAGNOSIS — Z923 Personal history of irradiation: Secondary | ICD-10-CM

## 2021-02-08 DIAGNOSIS — H9109 Ototoxic hearing loss, unspecified ear: Secondary | ICD-10-CM | POA: Diagnosis present

## 2021-02-08 DIAGNOSIS — R000 Tachycardia, unspecified: Secondary | ICD-10-CM | POA: Diagnosis not present

## 2021-02-08 DIAGNOSIS — Z8673 Personal history of transient ischemic attack (TIA), and cerebral infarction without residual deficits: Secondary | ICD-10-CM

## 2021-02-08 DIAGNOSIS — M8580 Other specified disorders of bone density and structure, unspecified site: Secondary | ICD-10-CM | POA: Diagnosis not present

## 2021-02-08 DIAGNOSIS — D6481 Anemia due to antineoplastic chemotherapy: Secondary | ICD-10-CM | POA: Diagnosis not present

## 2021-02-08 DIAGNOSIS — D649 Anemia, unspecified: Secondary | ICD-10-CM

## 2021-02-08 DIAGNOSIS — I4891 Unspecified atrial fibrillation: Secondary | ICD-10-CM | POA: Diagnosis not present

## 2021-02-08 DIAGNOSIS — Z888 Allergy status to other drugs, medicaments and biological substances status: Secondary | ICD-10-CM

## 2021-02-08 DIAGNOSIS — N179 Acute kidney failure, unspecified: Secondary | ICD-10-CM | POA: Diagnosis not present

## 2021-02-08 DIAGNOSIS — J189 Pneumonia, unspecified organism: Secondary | ICD-10-CM | POA: Diagnosis not present

## 2021-02-08 DIAGNOSIS — F419 Anxiety disorder, unspecified: Secondary | ICD-10-CM | POA: Diagnosis not present

## 2021-02-08 DIAGNOSIS — Z881 Allergy status to other antibiotic agents status: Secondary | ICD-10-CM

## 2021-02-08 DIAGNOSIS — I429 Cardiomyopathy, unspecified: Secondary | ICD-10-CM | POA: Diagnosis not present

## 2021-02-08 DIAGNOSIS — T451X5A Adverse effect of antineoplastic and immunosuppressive drugs, initial encounter: Secondary | ICD-10-CM

## 2021-02-08 DIAGNOSIS — E785 Hyperlipidemia, unspecified: Secondary | ICD-10-CM | POA: Diagnosis present

## 2021-02-08 DIAGNOSIS — R0902 Hypoxemia: Secondary | ICD-10-CM | POA: Diagnosis not present

## 2021-02-08 DIAGNOSIS — C3490 Malignant neoplasm of unspecified part of unspecified bronchus or lung: Secondary | ICD-10-CM

## 2021-02-08 DIAGNOSIS — C3432 Malignant neoplasm of lower lobe, left bronchus or lung: Secondary | ICD-10-CM

## 2021-02-08 DIAGNOSIS — D61810 Antineoplastic chemotherapy induced pancytopenia: Secondary | ICD-10-CM | POA: Diagnosis present

## 2021-02-08 DIAGNOSIS — J90 Pleural effusion, not elsewhere classified: Secondary | ICD-10-CM | POA: Diagnosis not present

## 2021-02-08 DIAGNOSIS — I110 Hypertensive heart disease with heart failure: Principal | ICD-10-CM | POA: Diagnosis present

## 2021-02-08 DIAGNOSIS — I471 Supraventricular tachycardia: Secondary | ICD-10-CM | POA: Diagnosis not present

## 2021-02-08 DIAGNOSIS — R0689 Other abnormalities of breathing: Secondary | ICD-10-CM | POA: Diagnosis not present

## 2021-02-08 DIAGNOSIS — K219 Gastro-esophageal reflux disease without esophagitis: Secondary | ICD-10-CM | POA: Diagnosis present

## 2021-02-08 DIAGNOSIS — J439 Emphysema, unspecified: Secondary | ICD-10-CM | POA: Diagnosis not present

## 2021-02-08 DIAGNOSIS — I4819 Other persistent atrial fibrillation: Secondary | ICD-10-CM | POA: Diagnosis present

## 2021-02-08 DIAGNOSIS — R0602 Shortness of breath: Secondary | ICD-10-CM | POA: Diagnosis present

## 2021-02-08 DIAGNOSIS — Z87891 Personal history of nicotine dependence: Secondary | ICD-10-CM

## 2021-02-08 DIAGNOSIS — Z20822 Contact with and (suspected) exposure to covid-19: Secondary | ICD-10-CM | POA: Diagnosis present

## 2021-02-08 DIAGNOSIS — B370 Candidal stomatitis: Secondary | ICD-10-CM | POA: Diagnosis not present

## 2021-02-08 DIAGNOSIS — Z66 Do not resuscitate: Secondary | ICD-10-CM | POA: Diagnosis not present

## 2021-02-08 DIAGNOSIS — I509 Heart failure, unspecified: Secondary | ICD-10-CM | POA: Diagnosis not present

## 2021-02-08 DIAGNOSIS — I5033 Acute on chronic diastolic (congestive) heart failure: Secondary | ICD-10-CM | POA: Diagnosis not present

## 2021-02-08 DIAGNOSIS — E222 Syndrome of inappropriate secretion of antidiuretic hormone: Secondary | ICD-10-CM | POA: Diagnosis not present

## 2021-02-08 DIAGNOSIS — J9621 Acute and chronic respiratory failure with hypoxia: Secondary | ICD-10-CM | POA: Diagnosis present

## 2021-02-08 DIAGNOSIS — T502X5A Adverse effect of carbonic-anhydrase inhibitors, benzothiadiazides and other diuretics, initial encounter: Secondary | ICD-10-CM | POA: Diagnosis not present

## 2021-02-08 DIAGNOSIS — Z7901 Long term (current) use of anticoagulants: Secondary | ICD-10-CM | POA: Insufficient documentation

## 2021-02-08 DIAGNOSIS — K589 Irritable bowel syndrome without diarrhea: Secondary | ICD-10-CM | POA: Diagnosis present

## 2021-02-08 DIAGNOSIS — I480 Paroxysmal atrial fibrillation: Secondary | ICD-10-CM | POA: Diagnosis not present

## 2021-02-08 DIAGNOSIS — I358 Other nonrheumatic aortic valve disorders: Secondary | ICD-10-CM | POA: Diagnosis present

## 2021-02-08 DIAGNOSIS — Z5111 Encounter for antineoplastic chemotherapy: Secondary | ICD-10-CM

## 2021-02-08 DIAGNOSIS — Z885 Allergy status to narcotic agent status: Secondary | ICD-10-CM

## 2021-02-08 LAB — CBC WITH DIFFERENTIAL (CANCER CENTER ONLY)
Abs Immature Granulocytes: 0.09 10*3/uL — ABNORMAL HIGH (ref 0.00–0.07)
Basophils Absolute: 0 10*3/uL (ref 0.0–0.1)
Basophils Relative: 0 %
Eosinophils Absolute: 0 10*3/uL (ref 0.0–0.5)
Eosinophils Relative: 0 %
HCT: 21.6 % — ABNORMAL LOW (ref 36.0–46.0)
Hemoglobin: 7.4 g/dL — ABNORMAL LOW (ref 12.0–15.0)
Immature Granulocytes: 3 %
Lymphocytes Relative: 9 %
Lymphs Abs: 0.3 10*3/uL — ABNORMAL LOW (ref 0.7–4.0)
MCH: 35.6 pg — ABNORMAL HIGH (ref 26.0–34.0)
MCHC: 34.3 g/dL (ref 30.0–36.0)
MCV: 103.8 fL — ABNORMAL HIGH (ref 80.0–100.0)
Monocytes Absolute: 0.5 10*3/uL (ref 0.1–1.0)
Monocytes Relative: 15 %
Neutro Abs: 2.3 10*3/uL (ref 1.7–7.7)
Neutrophils Relative %: 73 %
Platelet Count: 41 10*3/uL — ABNORMAL LOW (ref 150–400)
RBC: 2.08 MIL/uL — ABNORMAL LOW (ref 3.87–5.11)
WBC Count: 3.1 10*3/uL — ABNORMAL LOW (ref 4.0–10.5)
nRBC: 2.3 % — ABNORMAL HIGH (ref 0.0–0.2)

## 2021-02-08 LAB — CMP (CANCER CENTER ONLY)
ALT: 23 U/L (ref 0–44)
AST: 21 U/L (ref 15–41)
Albumin: 3.7 g/dL (ref 3.5–5.0)
Alkaline Phosphatase: 94 U/L (ref 38–126)
Anion gap: 8 (ref 5–15)
BUN: 16 mg/dL (ref 8–23)
CO2: 31 mmol/L (ref 22–32)
Calcium: 8.7 mg/dL — ABNORMAL LOW (ref 8.9–10.3)
Chloride: 99 mmol/L (ref 98–111)
Creatinine: 0.76 mg/dL (ref 0.44–1.00)
GFR, Estimated: >60 mL/min (ref >60)
Glucose, Bld: 102 mg/dL — ABNORMAL HIGH (ref 70–99)
Potassium: 4 mmol/L (ref 3.5–5.1)
Sodium: 138 mmol/L (ref 135–145)
Total Bilirubin: 0.4 mg/dL (ref 0.3–1.2)
Total Protein: 6.2 g/dL — ABNORMAL LOW (ref 6.5–8.1)

## 2021-02-08 LAB — MAGNESIUM: Magnesium: 1.3 mg/dL — ABNORMAL LOW (ref 1.7–2.4)

## 2021-02-08 LAB — PREPARE RBC (CROSSMATCH)

## 2021-02-08 MED ORDER — SODIUM CHLORIDE 0.9 % IV SOLN: INTRAVENOUS | Status: AC

## 2021-02-08 NOTE — Telephone Encounter (Signed)
Call received from [facility] with [facility] advising the pt SOB, tachycardic and rapid respirations. She was advised to have the pt to call EMS.  Patricia [name] then called back and advised she told the pt to call EMS and go to the ER and pt is refusing stating she feels a bit better at this time but understands to call EMS if her condition changes or worsens.

## 2021-02-08 NOTE — Patient Instructions (Signed)

## 2021-02-08 NOTE — Progress Notes (Signed)
[place] [phone]   [phone]  OFFICE PROGRESS NOTE  [name], MD [place] [id number]  DIAGNOSIS:  Limited stage (T2a, N2, M0) small cell lung cancer presented with left lower lobe lung mass in addition to left infrahilar and subcarinal lymphadenopathy diagnosed in October 2022.   PRIOR THERAPY: Systemic chemotherapy with cisplatin 80 mg/m2  on days 1 and etoposide 100 mg/m2 on days 1, 2, and 3 IV every 3 weeks. First dose 10/31/20.  Status post 4 cycles.  Her dose of cisplatin was reduced to 60 Mg/M2 from cycle #3 and starting cycle #4 of etoposide was also reduced to 80 Mg/M2 secondary to intolerance and pancytopenia   CURRENT THERAPY: Observation.  INTERVAL HISTORY: Patricia [name] 71 y.o. female returns to the clinic today for follow-up visit accompanied by her husband.  The patient continues to complain of increasing fatigue and weakness as well as dry heaves but she takes Compazine and it does help.  She also continues to complain of shortness of breath and she is currently on home oxygen 2 L/minute nasal cannula.  She is tachycardic today because of lack of p.o. intake and shortness of breath.  She denied having any chest pain has cough with no hemoptysis.  She has no vomiting, abdominal pain, diarrhea or constipation.  She has no headache or visual changes.  She is here today for evaluation and management of her condition.  MEDICAL HISTORY: Past Medical History:  Diagnosis Date   Anxiety    GERD (gastroesophageal reflux disease)    Hyperlipidemia    Hypertension    IBS (irritable bowel syndrome)    Insomnia    Low back pain    Lung cancer ([place]) 10/2020   TIA (transient ischemic attack) 10/2017   no deficits   Vitamin B12 deficiency    Vitamin D deficiency     ALLERGIES:  is allergic to augmentin [amoxicillin-pot clavulanate], levaquin [levofloxacin in d5w], magnesium hydroxide,  oxycodone, and other.  MEDICATIONS:  Current Outpatient Medications  Medication Sig Dispense Refill   carboxymethylcellulose (REFRESH PLUS) 0.5 % SOLN Place 1 drop into both eyes 3 (three) times daily as needed (for dryness).      carisoprodol (SOMA) 350 MG tablet Take 350 mg by mouth 3 (three) times daily.   5   Cholecalciferol (VITAMIN D3) 50 MCG (2000 UT) TABS Take 2,000 Units by mouth daily.     clobetasol ointment (TEMOVATE) 0.10 % Apply 1 application topically See admin instructions. Apply to vaginal area daily as directed     clonazePAM (KLONOPIN) 1 MG tablet Take 1 mg by mouth at bedtime.   3   diltiazem (CARDIZEM CD) 360 MG 24 hr capsule Take 1 capsule (360 mg total) by mouth daily. 90 capsule 2   docusate sodium (COLACE) 100 MG capsule Take 1 capsule (100 mg total) by mouth 2 (two) times daily. (Patient taking differently: Take 100 mg by mouth as needed for mild constipation or moderate constipation.) 10 capsule 0   ELIQUIS 5 MG TABS tablet TAKE 1 TABLET(5 MG) BY MOUTH TWICE DAILY 60 tablet 6   escitalopram (LEXAPRO) 10 MG tablet Take 1 tablet (10 mg total) by mouth daily. 30 tablet 2   fluticasone (FLONASE) 50 MCG/ACT nasal spray Place 2 sprays into both nostrils daily as needed for allergies or rhinitis.   5   furosemide (LASIX) 20 MG tablet Take 1 tablet (20 mg total) by  mouth daily. 30 tablet 2   gabapentin (NEURONTIN) 600 MG tablet Take 600 mg by mouth See admin instructions. Take 600 mg by mouth in the morning and at lunchtime     guaiFENesin-dextromethorphan (ROBITUSSIN DM) 100-10 MG/5ML syrup Take 10 mLs by mouth every 4 (four) hours as needed for cough. 118 mL 0   HYDROcodone-acetaminophen (NORCO) 7.5-325 MG tablet Take 1 tablet by mouth every 6 (six) hours as needed for severe pain. (Patient not taking: Reported on 01/24/2021) 15 tablet 0   ipratropium (ATROVENT) 0.02 % nebulizer solution Take by nebulization 3 (three) times daily. As directed     levalbuterol (XOPENEX) 1.25  MG/3ML nebulizer solution Inhale into the lungs as directed.     lidocaine (XYLOCAINE) 2 % solution Use as directed 15 mLs in the mouth or throat every 6 (six) hours as needed for mouth pain. 100 mL 0   linaclotide (LINZESS) 145 MCG CAPS capsule Take 1 capsule (145 mcg total) by mouth daily at 4 PM. (Patient taking differently: Take 145 mcg by mouth as needed.) 30 capsule 0   magic mouthwash SOLN Take 5 mLs by mouth 4 (four) times daily as needed for mouth pain. Pt is allergic to Magnesium (Patient not taking: Reported on 12/25/2020) 240 mL 1   magnesium oxide (MAG-OX) 400 (240 Mg) MG tablet Take 1 tablet (400 mg total) by mouth daily. 30 tablet 1   Metoprolol Tartrate 75 MG TABS Take 75 mg by mouth every 8 (eight) hours. 90 tablet 2   nystatin (MYCOSTATIN) 100000 UNIT/ML suspension Take 5 mLs (500,000 Units total) by mouth 4 (four) times daily. 440 mL 5   omeprazole (PRILOSEC) 20 MG capsule Take 20 mg by mouth daily before breakfast.      ondansetron (ZOFRAN-ODT) 8 MG disintegrating tablet Take 8 mg by mouth daily as needed for nausea.     polyethylene glycol (MIRALAX / GLYCOLAX) packet Take 34 g by mouth in the morning.      prochlorperazine (COMPAZINE) 10 MG tablet Take 1 tablet (10 mg total) by mouth every 6 (six) hours as needed for nausea or vomiting. 30 tablet 0   rosuvastatin (CRESTOR) 5 MG tablet Take 5 mg by mouth 3 (three) times a week. Mondays Wednesdays Fridays     sodium chloride 1 g tablet Take 1 g by mouth 3 (three) times daily.     traMADol (ULTRAM) 50 MG tablet Take 100 mg by mouth in the morning, at noon, and at bedtime.     zaleplon (SONATA) 10 MG capsule Take 10 mg by mouth at bedtime as needed (for interrupted sleep).      No current facility-administered medications for this visit.    SURGICAL HISTORY:  Past Surgical History:  Procedure Laterality Date   BACK SURGERY     BREAST EXCISIONAL BIOPSY Left 1997   benign   BRONCHIAL BIOPSY  10/18/2020   Procedure: BRONCHIAL  BIOPSIES;  Surgeon: [name], DO;  Location: [facility];  Service: Pulmonary;;   BRONCHIAL BRUSHINGS  10/18/2020   Procedure: BRONCHIAL BRUSHINGS;  Surgeon: [name], DO;  Location: [facility];  Service: Pulmonary;;   BRONCHIAL NEEDLE ASPIRATION BIOPSY  10/18/2020   Procedure: BRONCHIAL NEEDLE ASPIRATION BIOPSIES;  Surgeon: [name], DO;  Location: [facility];  Service: Pulmonary;;   BRONCHIAL WASHINGS  10/18/2020   Procedure: BRONCHIAL WASHINGS;  Surgeon: [name], DO;  Location: [facility];  Service: Pulmonary;;   hemorrhoidecotmy     HERNIA MESH REMOVAL  OPEN REDUCTION INTERNAL FIXATION (ORIF) DISTAL RADIAL FRACTURE Right 11/19/2019   Procedure: OPEN REDUCTION INTERNAL FIXATION (ORIF) DISTAL RADIUS AND ULNA FRACTURE WITH REPAIR AS NECESSARY;  Surgeon: Roseanne Kaufman, MD;  Location: [facility];  Service: Orthopedics;  Laterality: Right;  2 hrs Block with IV sedation   ORIF RADIUS & ULNA FRACTURES     TONSILLECTOMY     VIDEO BRONCHOSCOPY WITH ENDOBRONCHIAL NAVIGATION Left 10/18/2020   Procedure: VIDEO BRONCHOSCOPY WITH ENDOBRONCHIAL NAVIGATION;  Surgeon: [name], DO;  Location: [facility];  Service: Pulmonary;  Laterality: Left;  ION   VIDEO BRONCHOSCOPY WITH ENDOBRONCHIAL ULTRASOUND Bilateral 10/18/2020   Procedure: VIDEO BRONCHOSCOPY WITH ENDOBRONCHIAL ULTRASOUND;  Surgeon: [name], DO;  Location: [facility];  Service: Pulmonary;  Laterality: Bilateral;   VIDEO BRONCHOSCOPY WITH RADIAL ENDOBRONCHIAL ULTRASOUND  10/18/2020   Procedure: VIDEO BRONCHOSCOPY WITH RADIAL ENDOBRONCHIAL ULTRASOUND;  Surgeon: [name], DO;  Location: [facility];  Service: Pulmonary;;    REVIEW OF SYSTEMS:  Constitutional: positive for anorexia, fatigue, and weight loss Eyes: negative Ears, nose, mouth, throat, and face: negative Respiratory: positive for cough and dyspnea on exertion Cardiovascular: negative Gastrointestinal: positive for  nausea Genitourinary:negative Integument/breast: negative Hematologic/lymphatic: negative Musculoskeletal:positive for muscle weakness Neurological: negative Behavioral/Psych: negative Endocrine: negative Allergic/Immunologic: negative   PHYSICAL EXAMINATION: General appearance: alert, cooperative, fatigued, and no distress Head: Normocephalic, without obvious abnormality, atraumatic Neck: no adenopathy, no JVD, supple, symmetrical, trachea midline, and thyroid not enlarged, symmetric, no tenderness/mass/nodules Lymph nodes: Cervical, supraclavicular, and axillary nodes normal. Resp: clear to auscultation bilaterally Back: symmetric, no curvature. ROM normal. No CVA tenderness. Cardio: regular rate and rhythm, S1, S2 normal, no murmur, click, rub or gallop GI: soft, non-tender; bowel sounds normal; no masses,  no organomegaly Extremities: extremities normal, atraumatic, no cyanosis or edema Neurologic: Alert and oriented X 3, normal strength and tone. Normal symmetric reflexes. Normal coordination and gait  ECOG PERFORMANCE STATUS: 1 - Symptomatic but completely ambulatory  Blood pressure 99/75, pulse (!) 133, temperature 97.8 F (36.6 C), temperature source Tympanic, resp. rate 20, height 5\' 6"  (1.676 m), weight 186 lb 9.6 oz (84.6 kg), SpO2 90 %.  LABORATORY DATA: Lab Results  Component Value Date   WBC 3.1 (L) 02/08/2021   HGB 7.4 (L) 02/08/2021   HCT 21.6 (L) 02/08/2021   MCV 103.8 (H) 02/08/2021   PLT 41 (L) 02/08/2021      Chemistry      Component Value Date/Time   NA 139 02/01/2021 1330   K 4.3 02/01/2021 1330   CL 100 02/01/2021 1330   CO2 32 02/01/2021 1330   BUN 22 02/01/2021 1330   CREATININE 0.88 02/01/2021 1330      Component Value Date/Time   CALCIUM 7.6 (L) 02/01/2021 1330   ALKPHOS 80 02/01/2021 1330   AST 14 (L) 02/01/2021 1330   ALT 14 02/01/2021 1330   BILITOT 0.4 02/01/2021 1330       RADIOGRAPHIC STUDIES: No results found.  ASSESSMENT  AND PLAN: This is a very pleasant 71 years old white female recently diagnosed with limited stage (T2 a, N2, M0) small cell lung cancer presented with left lower lobe lung mass in addition to left hilar and subcarinal lymphadenopathy in October 2022.  The patient is currently undergoing a course of systemic chemotherapy with cisplatin and etoposide status post 4 cycles concurrent with radiation. Starting from cycle #4 her dose of cisplatin will be reduced to 60 Mg/M2 on day 1 and 2 etoposide 80 Mg/M2 on days  1, 2 and 3. The patient has a rough time with this treatment with significant fatigue and weakness as well as pancytopenia.  She also continues to have the baseline shortness of breath. She is tachycardic today secondary to lack of hydration as well as chemotherapy-induced anemia. For the chemotherapy-induced anemia, I will arrange for the patient to receive 2 units of PRBCs transfusion today. I will see her back for follow-up visit in 2 weeks for evaluation with repeat CT scan of the chest for restaging of her disease. I will also consider referring the patient to Dr. Lisbeth Renshaw for discussion of the prophylactic cranial radiation after her next visit. The patient was advised to call immediately if she has any other concerning symptoms in the interval. The patient voices understanding of current disease status and treatment options and is in agreement with the current care plan.  All questions were answered. The patient knows to call the clinic with any problems, questions or concerns. We can certainly see the patient much sooner if necessary. The total time spent in the appointment was 30 minutes.  Disclaimer: This note was dictated with voice recognition software. Similar sounding words can inadvertently be transcribed and may not be corrected upon review.

## 2021-02-09 NOTE — Progress Notes (Signed)
°  Radiation Oncology         [phone] ________________________________  Name: Patricia [name] MRN: [id number]  Date of Service: 02/13/2021  DOB: 06/14/1950  Post Treatment Telephone Note  Diagnosis:   Limited Stage Small Cell Carcinoma of the LLL  Intent: Curative  Radiation Treatment Dates: 11/21/2020 through 01/13/2021 Site Technique Total Dose (Gy) Dose per Fx (Gy) Completed Fx Beam Energies  Lung, Left: Lung_Lt 3D 60/60 2 30/30 6X  Lung, Left: Lung_Lt_Bst 3D 6/6 2 3/3 6X   Narrative: The patient tolerated radiation therapy sessions but did develop esophagitis that was difficult to manage due to medication intolerance and schedule. She is feeling much better regarding her swallowing and is enjoying a regular diet again but is currently hospitalized with CHF.    Impression/Plan: 1. 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 The patient has been doing well since completion of radiotherapy. We discussed that we would be happy to continue to follow her as we pursue prophylactic cranial irradiation; but she will also continue to follow up with Dr. Julien Nordmann in medical oncology.  2. PCI. The patient has completed her chemotherapy. We discussed the rationale for prophylactic cranial irradiation given small cell carcinoma. We reviewed the need for an MRI to rule out disease prior to proceeding. I've ordered this while she is an inpatient. We discussed the risks, benefits, short, and long term effects of radiotherapy, as well as the curative intent, and the patient is interested in proceeding. We also discussed the use of Namenda per NCCN guidelines to try to limit long term risks of cognitive deficits from whole brain treatment. She is in agreement to use this as well and after reviewing the side effect profile a new prescription was sent into her pharmacy. We will coordinate simulation in the next few days as well.       Carola Rhine, PAC

## 2021-02-10 ENCOUNTER — Other Ambulatory Visit: Payer: Self-pay

## 2021-02-10 ENCOUNTER — Inpatient Hospital Stay: Payer: Medicare HMO

## 2021-02-10 DIAGNOSIS — R000 Tachycardia, unspecified: Secondary | ICD-10-CM | POA: Diagnosis not present

## 2021-02-10 DIAGNOSIS — Z7901 Long term (current) use of anticoagulants: Secondary | ICD-10-CM | POA: Diagnosis not present

## 2021-02-10 DIAGNOSIS — D649 Anemia, unspecified: Secondary | ICD-10-CM

## 2021-02-10 DIAGNOSIS — M8580 Other specified disorders of bone density and structure, unspecified site: Secondary | ICD-10-CM | POA: Diagnosis not present

## 2021-02-10 DIAGNOSIS — Z79899 Other long term (current) drug therapy: Secondary | ICD-10-CM | POA: Diagnosis not present

## 2021-02-10 DIAGNOSIS — D6481 Anemia due to antineoplastic chemotherapy: Secondary | ICD-10-CM | POA: Diagnosis not present

## 2021-02-10 DIAGNOSIS — C3432 Malignant neoplasm of lower lobe, left bronchus or lung: Secondary | ICD-10-CM | POA: Diagnosis not present

## 2021-02-10 MED ORDER — ACETAMINOPHEN 325 MG PO TABS
650.0000 mg | ORAL_TABLET | Freq: Once | ORAL | Status: AC
  Administered 2021-02-10: 650 mg via ORAL
  Filled 2021-02-10: qty 2

## 2021-02-10 MED ORDER — SODIUM CHLORIDE 0.9% IV SOLUTION
250.0000 mL | Freq: Once | INTRAVENOUS | Status: AC
  Administered 2021-02-10: 250 mL via INTRAVENOUS

## 2021-02-10 MED ORDER — DIPHENHYDRAMINE HCL 25 MG PO CAPS
25.0000 mg | ORAL_CAPSULE | Freq: Once | ORAL | Status: AC
  Administered 2021-02-10: 25 mg via ORAL
  Filled 2021-02-10: qty 1

## 2021-02-10 NOTE — Patient Instructions (Signed)
Blood Transfusion, Adult, Care After This sheet gives you information about how to care for yourself after your procedure. Your doctor may also give you more specific instructions. If you have problems or questions, contact your doctor. What can I expect after the procedure? After the procedure, it is common to have: Bruising and soreness at the IV site. A headache. Follow these instructions at home: Insertion site care   Follow instructions from your doctor about how to take care of your insertion site. This is where an IV tube was put into your vein. Make sure you: Wash your hands with soap and water before and after you change your bandage (dressing). If you cannot use soap and water, use hand sanitizer. Change your bandage as told by your doctor. Check your insertion site every day for signs of infection. Check for: Redness, swelling, or pain. Bleeding from the site. Warmth. Pus or a bad smell. General instructions Take over-the-counter and prescription medicines only as told by your doctor. Rest as told by your doctor. Go back to your normal activities as told by your doctor. Keep all follow-up visits as told by your doctor. This is important. Contact a doctor if: You have itching or red, swollen areas of skin (hives). You feel worried or nervous (anxious). You feel weak after doing your normal activities. You have redness, swelling, warmth, or pain around the insertion site. You have blood coming from the insertion site, and the blood does not stop with pressure. You have pus or a bad smell coming from the insertion site. Get help right away if: You have signs of a serious reaction. This may be coming from an allergy or the body's defense system (immune system). Signs include: Trouble breathing or shortness of breath. Swelling of the face or feeling warm (flushed). Fever or chills. Head, chest, or back pain. Dark pee (urine) or blood in the pee. Widespread rash. Fast  heartbeat. Feeling dizzy or light-headed. You may receive your blood transfusion in an outpatient setting. If so, you will be told whom to contact to report any reactions. These symptoms may be an emergency. Do not wait to see if the symptoms will go away. Get medical help right away. Call your local emergency services (911 in the [place] Do not drive yourself to the hospital. Summary Bruising and soreness at the IV site are common. Check your insertion site every day for signs of infection. Rest as told by your doctor. Go back to your normal activities as told by your doctor. Get help right away if you have signs of a serious reaction. This information is not intended to replace advice given to you by your health care provider. Make sure you discuss any questions you have with your health care provider. Document Revised: 04/21/2020 Document Reviewed: 06/19/2018 Elsevier Patient Education  2022 Elsevier Inc.  

## 2021-02-11 ENCOUNTER — Other Ambulatory Visit: Payer: Self-pay

## 2021-02-11 ENCOUNTER — Emergency Department (HOSPITAL_COMMUNITY): Payer: Medicare HMO

## 2021-02-11 ENCOUNTER — Encounter (HOSPITAL_COMMUNITY): Payer: Self-pay

## 2021-02-11 ENCOUNTER — Inpatient Hospital Stay (HOSPITAL_COMMUNITY)
Admission: EM | Admit: 2021-02-11 | Discharge: 2021-02-21 | DRG: 291 | Disposition: A | Discharge status: Home / Self Care | Payer: Medicare HMO | Attending: Internal Medicine | Admitting: Internal Medicine

## 2021-02-11 DIAGNOSIS — Z885 Allergy status to narcotic agent status: Secondary | ICD-10-CM

## 2021-02-11 DIAGNOSIS — I4819 Other persistent atrial fibrillation: Secondary | ICD-10-CM | POA: Diagnosis present

## 2021-02-11 DIAGNOSIS — Z20822 Contact with and (suspected) exposure to covid-19: Secondary | ICD-10-CM | POA: Diagnosis present

## 2021-02-11 DIAGNOSIS — E222 Syndrome of inappropriate secretion of antidiuretic hormone: Secondary | ICD-10-CM | POA: Diagnosis not present

## 2021-02-11 DIAGNOSIS — J90 Pleural effusion, not elsewhere classified: Secondary | ICD-10-CM | POA: Insufficient documentation

## 2021-02-11 DIAGNOSIS — I471 Supraventricular tachycardia: Secondary | ICD-10-CM | POA: Diagnosis not present

## 2021-02-11 DIAGNOSIS — F419 Anxiety disorder, unspecified: Secondary | ICD-10-CM | POA: Diagnosis not present

## 2021-02-11 DIAGNOSIS — E785 Hyperlipidemia, unspecified: Secondary | ICD-10-CM | POA: Diagnosis not present

## 2021-02-11 DIAGNOSIS — K219 Gastro-esophageal reflux disease without esophagitis: Secondary | ICD-10-CM | POA: Diagnosis present

## 2021-02-11 DIAGNOSIS — Z8673 Personal history of transient ischemic attack (TIA), and cerebral infarction without residual deficits: Secondary | ICD-10-CM

## 2021-02-11 DIAGNOSIS — T451X5A Adverse effect of antineoplastic and immunosuppressive drugs, initial encounter: Secondary | ICD-10-CM | POA: Diagnosis not present

## 2021-02-11 DIAGNOSIS — Z923 Personal history of irradiation: Secondary | ICD-10-CM | POA: Diagnosis not present

## 2021-02-11 DIAGNOSIS — I110 Hypertensive heart disease with heart failure: Secondary | ICD-10-CM | POA: Diagnosis not present

## 2021-02-11 DIAGNOSIS — Z87891 Personal history of nicotine dependence: Secondary | ICD-10-CM

## 2021-02-11 DIAGNOSIS — B370 Candidal stomatitis: Secondary | ICD-10-CM | POA: Diagnosis not present

## 2021-02-11 DIAGNOSIS — I480 Paroxysmal atrial fibrillation: Secondary | ICD-10-CM | POA: Diagnosis not present

## 2021-02-11 DIAGNOSIS — N179 Acute kidney failure, unspecified: Secondary | ICD-10-CM | POA: Diagnosis not present

## 2021-02-11 DIAGNOSIS — I5043 Acute on chronic combined systolic (congestive) and diastolic (congestive) heart failure: Secondary | ICD-10-CM | POA: Diagnosis present

## 2021-02-11 DIAGNOSIS — I959 Hypotension, unspecified: Secondary | ICD-10-CM | POA: Diagnosis not present

## 2021-02-11 DIAGNOSIS — T502X5A Adverse effect of carbonic-anhydrase inhibitors, benzothiadiazides and other diuretics, initial encounter: Secondary | ICD-10-CM | POA: Diagnosis not present

## 2021-02-11 DIAGNOSIS — I5033 Acute on chronic diastolic (congestive) heart failure: Secondary | ICD-10-CM | POA: Diagnosis not present

## 2021-02-11 DIAGNOSIS — T451X5S Adverse effect of antineoplastic and immunosuppressive drugs, sequela: Secondary | ICD-10-CM

## 2021-02-11 DIAGNOSIS — J439 Emphysema, unspecified: Secondary | ICD-10-CM | POA: Diagnosis not present

## 2021-02-11 DIAGNOSIS — J189 Pneumonia, unspecified organism: Secondary | ICD-10-CM

## 2021-02-11 DIAGNOSIS — Z66 Do not resuscitate: Secondary | ICD-10-CM | POA: Diagnosis not present

## 2021-02-11 DIAGNOSIS — J9621 Acute and chronic respiratory failure with hypoxia: Secondary | ICD-10-CM | POA: Diagnosis not present

## 2021-02-11 DIAGNOSIS — K589 Irritable bowel syndrome without diarrhea: Secondary | ICD-10-CM | POA: Diagnosis present

## 2021-02-11 DIAGNOSIS — C3432 Malignant neoplasm of lower lobe, left bronchus or lung: Secondary | ICD-10-CM | POA: Diagnosis present

## 2021-02-11 DIAGNOSIS — J9611 Chronic respiratory failure with hypoxia: Secondary | ICD-10-CM | POA: Insufficient documentation

## 2021-02-11 DIAGNOSIS — I4892 Unspecified atrial flutter: Secondary | ICD-10-CM | POA: Diagnosis not present

## 2021-02-11 DIAGNOSIS — E876 Hypokalemia: Secondary | ICD-10-CM | POA: Diagnosis not present

## 2021-02-11 DIAGNOSIS — I429 Cardiomyopathy, unspecified: Secondary | ICD-10-CM | POA: Diagnosis not present

## 2021-02-11 DIAGNOSIS — H9109 Ototoxic hearing loss, unspecified ear: Secondary | ICD-10-CM | POA: Diagnosis present

## 2021-02-11 DIAGNOSIS — R0689 Other abnormalities of breathing: Secondary | ICD-10-CM | POA: Diagnosis not present

## 2021-02-11 DIAGNOSIS — I509 Heart failure, unspecified: Secondary | ICD-10-CM

## 2021-02-11 DIAGNOSIS — I358 Other nonrheumatic aortic valve disorders: Secondary | ICD-10-CM | POA: Diagnosis present

## 2021-02-11 DIAGNOSIS — R0902 Hypoxemia: Secondary | ICD-10-CM | POA: Diagnosis not present

## 2021-02-11 DIAGNOSIS — J9811 Atelectasis: Secondary | ICD-10-CM | POA: Diagnosis not present

## 2021-02-11 DIAGNOSIS — C3490 Malignant neoplasm of unspecified part of unspecified bronchus or lung: Secondary | ICD-10-CM | POA: Diagnosis not present

## 2021-02-11 DIAGNOSIS — Z803 Family history of malignant neoplasm of breast: Secondary | ICD-10-CM

## 2021-02-11 DIAGNOSIS — I4891 Unspecified atrial fibrillation: Secondary | ICD-10-CM | POA: Diagnosis not present

## 2021-02-11 DIAGNOSIS — Z7901 Long term (current) use of anticoagulants: Secondary | ICD-10-CM

## 2021-02-11 DIAGNOSIS — Z881 Allergy status to other antibiotic agents status: Secondary | ICD-10-CM

## 2021-02-11 DIAGNOSIS — D61810 Antineoplastic chemotherapy induced pancytopenia: Secondary | ICD-10-CM | POA: Diagnosis present

## 2021-02-11 DIAGNOSIS — Z888 Allergy status to other drugs, medicaments and biological substances status: Secondary | ICD-10-CM

## 2021-02-11 DIAGNOSIS — R0602 Shortness of breath: Secondary | ICD-10-CM | POA: Diagnosis present

## 2021-02-11 DIAGNOSIS — R000 Tachycardia, unspecified: Secondary | ICD-10-CM | POA: Diagnosis not present

## 2021-02-11 DIAGNOSIS — Z79899 Other long term (current) drug therapy: Secondary | ICD-10-CM

## 2021-02-11 HISTORY — DX: Anemia, unspecified: D64.9

## 2021-02-11 LAB — BASIC METABOLIC PANEL
Anion gap: 7 (ref 5–15)
BUN: 14 mg/dL (ref 8–23)
CO2: 31 mmol/L (ref 22–32)
Calcium: 8.4 mg/dL — ABNORMAL LOW (ref 8.9–10.3)
Chloride: 99 mmol/L (ref 98–111)
Creatinine, Ser: 0.86 mg/dL (ref 0.44–1.00)
GFR, Estimated: >60 mL/min (ref >60)
Glucose, Bld: 115 mg/dL — ABNORMAL HIGH (ref 70–99)
Potassium: 4 mmol/L (ref 3.5–5.1)
Sodium: 137 mmol/L (ref 135–145)

## 2021-02-11 LAB — CBC WITH DIFFERENTIAL/PLATELET
Abs Immature Granulocytes: 0.11 10*3/uL — ABNORMAL HIGH (ref 0.00–0.07)
Basophils Absolute: 0 10*3/uL (ref 0.0–0.1)
Basophils Relative: 0 %
Eosinophils Absolute: 0 10*3/uL (ref 0.0–0.5)
Eosinophils Relative: 0 %
HCT: 31.2 % — ABNORMAL LOW (ref 36.0–46.0)
Hemoglobin: 10.6 g/dL — ABNORMAL LOW (ref 12.0–15.0)
Immature Granulocytes: 2 %
Lymphocytes Relative: 4 %
Lymphs Abs: 0.2 10*3/uL — ABNORMAL LOW (ref 0.7–4.0)
MCH: 33.3 pg (ref 26.0–34.0)
MCHC: 34 g/dL (ref 30.0–36.0)
MCV: 98.1 fL (ref 80.0–100.0)
Monocytes Absolute: 0.5 10*3/uL (ref 0.1–1.0)
Monocytes Relative: 7 %
Neutro Abs: 5.3 10*3/uL (ref 1.7–7.7)
Neutrophils Relative %: 87 %
Platelets: 53 10*3/uL — ABNORMAL LOW (ref 150–400)
RBC: 3.18 MIL/uL — ABNORMAL LOW (ref 3.87–5.11)
RDW: 23.8 % — ABNORMAL HIGH (ref 11.5–15.5)
WBC: 6.1 10*3/uL (ref 4.0–10.5)
nRBC: 0.8 % — ABNORMAL HIGH (ref 0.0–0.2)

## 2021-02-11 LAB — BRAIN NATRIURETIC PEPTIDE: B Natriuretic Peptide: 420.9 pg/mL — ABNORMAL HIGH (ref 0.0–100.0)

## 2021-02-11 LAB — MAGNESIUM: Magnesium: 1.3 mg/dL — ABNORMAL LOW (ref 1.7–2.4)

## 2021-02-11 LAB — RESP PANEL BY RT-PCR (FLU A&B, COVID) ARPGX2
Influenza A by PCR: NEGATIVE
Influenza B by PCR: NEGATIVE
SARS Coronavirus 2 by RT PCR: NEGATIVE

## 2021-02-11 LAB — TROPONIN I (HIGH SENSITIVITY)
Troponin I (High Sensitivity): 12 ng/L (ref <18)
Troponin I (High Sensitivity): 10 ng/L (ref <18)

## 2021-02-11 MED ORDER — ACETAMINOPHEN 325 MG PO TABS: 650.0000 mg | ORAL_TABLET | Freq: Four times a day (QID) | ORAL | Status: DC | PRN

## 2021-02-11 MED ORDER — CARISOPRODOL 350 MG PO TABS
350.0000 mg | ORAL_TABLET | Freq: Three times a day (TID) | ORAL | Status: DC
  Administered 2021-02-11 – 2021-02-21 (×30): 350 mg via ORAL
  Filled 2021-02-11 (×31): qty 1

## 2021-02-11 MED ORDER — LINACLOTIDE 145 MCG PO CAPS
145.0000 ug | ORAL_CAPSULE | Freq: Every day | ORAL | Status: DC
  Administered 2021-02-11: 16:00:00 145 ug via ORAL
  Filled 2021-02-11 (×11): qty 1

## 2021-02-11 MED ORDER — POLYVINYL ALCOHOL 1.4 % OP SOLN: 1.0000 [drp] | Freq: Three times a day (TID) | OPHTHALMIC | Status: DC | PRN

## 2021-02-11 MED ORDER — DILTIAZEM HCL-DEXTROSE 125-5 MG/125ML-% IV SOLN (PREMIX)
5.0000 mg/h | INTRAVENOUS | Status: DC
  Administered 2021-02-11 (×2): 5 mg/h via INTRAVENOUS
  Filled 2021-02-11 (×2): qty 125

## 2021-02-11 MED ORDER — FUROSEMIDE 10 MG/ML IJ SOLN
40.0000 mg | Freq: Once | INTRAMUSCULAR | Status: AC
  Administered 2021-02-11: 40 mg via INTRAVENOUS
  Filled 2021-02-11: qty 4

## 2021-02-11 MED ORDER — AMIODARONE HCL IN DEXTROSE 360-4.14 MG/200ML-% IV SOLN
60.0000 mg/h | INTRAVENOUS | Status: AC
  Administered 2021-02-11 (×2): 60 mg/h via INTRAVENOUS
  Filled 2021-02-11: qty 200

## 2021-02-11 MED ORDER — SODIUM CHLORIDE 0.9 % IV SOLN
100.0000 mg | Freq: Once | INTRAVENOUS | Status: AC
  Administered 2021-02-11: 100 mg via INTRAVENOUS
  Filled 2021-02-11: qty 100

## 2021-02-11 MED ORDER — CLONAZEPAM 0.5 MG PO TABS
1.0000 mg | ORAL_TABLET | Freq: Every day | ORAL | Status: DC
  Administered 2021-02-11 – 2021-02-20 (×10): 1 mg via ORAL
  Filled 2021-02-11 (×10): qty 2

## 2021-02-11 MED ORDER — TRAMADOL HCL 50 MG PO TABS
100.0000 mg | ORAL_TABLET | Freq: Three times a day (TID) | ORAL | Status: DC | PRN
  Administered 2021-02-11 – 2021-02-14 (×2): 100 mg via ORAL
  Filled 2021-02-11 (×2): qty 2

## 2021-02-11 MED ORDER — ONDANSETRON HCL 4 MG PO TABS: 4.0000 mg | ORAL_TABLET | Freq: Four times a day (QID) | ORAL | Status: DC | PRN

## 2021-02-11 MED ORDER — APIXABAN 5 MG PO TABS
5.0000 mg | ORAL_TABLET | Freq: Two times a day (BID) | ORAL | Status: DC
  Administered 2021-02-11 – 2021-02-21 (×21): 5 mg via ORAL
  Filled 2021-02-11 (×21): qty 1

## 2021-02-11 MED ORDER — POLYETHYLENE GLYCOL 3350 17 G PO PACK
34.0000 g | PACK | Freq: Every morning | ORAL | Status: DC
  Administered 2021-02-12 – 2021-02-20 (×7): 34 g via ORAL
  Filled 2021-02-11 (×7): qty 2

## 2021-02-11 MED ORDER — GUAIFENESIN-DM 100-10 MG/5ML PO SYRP
10.0000 mL | ORAL_SOLUTION | ORAL | Status: DC | PRN
  Filled 2021-02-11: qty 10

## 2021-02-11 MED ORDER — MAGIC MOUTHWASH
5.0000 mL | Freq: Four times a day (QID) | ORAL | Status: DC | PRN
  Administered 2021-02-11 – 2021-02-15 (×9): 5 mL via ORAL
  Filled 2021-02-11 (×10): qty 5

## 2021-02-11 MED ORDER — CARBOXYMETHYLCELLULOSE SODIUM 0.5 % OP SOLN: 1.0000 [drp] | Freq: Three times a day (TID) | OPHTHALMIC | Status: DC | PRN

## 2021-02-11 MED ORDER — AMIODARONE LOAD VIA INFUSION
150.0000 mg | Freq: Once | INTRAVENOUS | Status: AC
  Administered 2021-02-11: 150 mg via INTRAVENOUS
  Filled 2021-02-11: qty 83.34

## 2021-02-11 MED ORDER — FUROSEMIDE 10 MG/ML IJ SOLN
40.0000 mg | Freq: Two times a day (BID) | INTRAMUSCULAR | Status: DC
  Administered 2021-02-11 – 2021-02-14 (×6): 40 mg via INTRAVENOUS
  Filled 2021-02-11 (×6): qty 4

## 2021-02-11 MED ORDER — ACETAMINOPHEN 650 MG RE SUPP: 650.0000 mg | Freq: Four times a day (QID) | RECTAL | Status: DC | PRN

## 2021-02-11 MED ORDER — IPRATROPIUM BROMIDE 0.02 % IN SOLN: 0.5000 mg | Freq: Three times a day (TID) | RESPIRATORY_TRACT | Status: DC

## 2021-02-11 MED ORDER — IOHEXOL 350 MG/ML SOLN
100.0000 mL | Freq: Once | INTRAVENOUS | Status: AC | PRN
  Administered 2021-02-11: 100 mL via INTRAVENOUS

## 2021-02-11 MED ORDER — NYSTATIN 100000 UNIT/ML MT SUSP
5.0000 mL | Freq: Two times a day (BID) | OROMUCOSAL | Status: DC | PRN
  Administered 2021-02-11 – 2021-02-12 (×2): 500000 [IU] via ORAL
  Filled 2021-02-11 (×2): qty 5

## 2021-02-11 MED ORDER — ZOLPIDEM TARTRATE 5 MG PO TABS
5.0000 mg | ORAL_TABLET | Freq: Every evening | ORAL | Status: DC | PRN
  Administered 2021-02-12 – 2021-02-20 (×7): 5 mg via ORAL
  Filled 2021-02-11 (×7): qty 1

## 2021-02-11 MED ORDER — ROSUVASTATIN CALCIUM 5 MG PO TABS
5.0000 mg | ORAL_TABLET | ORAL | Status: DC
  Administered 2021-02-13 – 2021-02-20 (×4): 5 mg via ORAL
  Filled 2021-02-11 (×5): qty 1

## 2021-02-11 MED ORDER — IPRATROPIUM BROMIDE 0.02 % IN SOLN
0.5000 mg | Freq: Three times a day (TID) | RESPIRATORY_TRACT | Status: DC
  Administered 2021-02-11 – 2021-02-15 (×13): 0.5 mg via RESPIRATORY_TRACT
  Filled 2021-02-11 (×13): qty 2.5

## 2021-02-11 MED ORDER — AMIODARONE HCL IN DEXTROSE 360-4.14 MG/200ML-% IV SOLN
60.0000 mg/h | INTRAVENOUS | Status: DC
  Administered 2021-02-11 – 2021-02-12 (×3): 30 mg/h via INTRAVENOUS
  Administered 2021-02-13: 60 mg/h via INTRAVENOUS
  Administered 2021-02-13: 30 mg/h via INTRAVENOUS
  Administered 2021-02-13 – 2021-02-14 (×2): 60 mg/h via INTRAVENOUS
  Filled 2021-02-11 (×7): qty 200

## 2021-02-11 MED ORDER — MAGNESIUM SULFATE 2 GM/50ML IV SOLN
2.0000 g | Freq: Once | INTRAVENOUS | Status: AC
  Administered 2021-02-11: 2 g via INTRAVENOUS
  Filled 2021-02-11: qty 50

## 2021-02-11 MED ORDER — POLYVINYL ALCOHOL 1.4 % OP SOLN
1.0000 [drp] | Freq: Every day | OPHTHALMIC | Status: DC
  Administered 2021-02-11 – 2021-02-20 (×9): 1 [drp] via OPHTHALMIC
  Filled 2021-02-11: qty 15

## 2021-02-11 MED ORDER — SODIUM CHLORIDE 1 G PO TABS
1.0000 g | ORAL_TABLET | Freq: Three times a day (TID) | ORAL | Status: DC
  Administered 2021-02-11 – 2021-02-16 (×15): 1 g via ORAL
  Filled 2021-02-11 (×16): qty 1

## 2021-02-11 MED ORDER — SODIUM CHLORIDE 0.9% FLUSH
3.0000 mL | Freq: Two times a day (BID) | INTRAVENOUS | Status: DC
  Administered 2021-02-11 – 2021-02-21 (×18): 3 mL via INTRAVENOUS

## 2021-02-11 MED ORDER — METOPROLOL TARTRATE 50 MG PO TABS
75.0000 mg | ORAL_TABLET | Freq: Two times a day (BID) | ORAL | Status: DC
  Administered 2021-02-11 – 2021-02-15 (×10): 75 mg via ORAL
  Filled 2021-02-11: qty 1
  Filled 2021-02-11: qty 3
  Filled 2021-02-11 (×8): qty 1

## 2021-02-11 MED ORDER — FLUTICASONE PROPIONATE 50 MCG/ACT NA SUSP: 2.0000 | Freq: Every day | NASAL | Status: DC | PRN

## 2021-02-11 MED ORDER — PANTOPRAZOLE SODIUM 40 MG PO TBEC
40.0000 mg | DELAYED_RELEASE_TABLET | Freq: Every day | ORAL | Status: DC
  Administered 2021-02-11 – 2021-02-21 (×11): 40 mg via ORAL
  Filled 2021-02-11 (×11): qty 1

## 2021-02-11 MED ORDER — GABAPENTIN 300 MG PO CAPS
600.0000 mg | ORAL_CAPSULE | Freq: Two times a day (BID) | ORAL | Status: DC
  Administered 2021-02-11 – 2021-02-21 (×19): 600 mg via ORAL
  Filled 2021-02-11 (×20): qty 2

## 2021-02-11 MED ORDER — LEVALBUTEROL HCL 0.63 MG/3ML IN NEBU
1.2500 mg | INHALATION_SOLUTION | Freq: Four times a day (QID) | RESPIRATORY_TRACT | Status: DC | PRN
  Administered 2021-02-11 – 2021-02-15 (×7): 1.25 mg via RESPIRATORY_TRACT
  Filled 2021-02-11 (×8): qty 6

## 2021-02-11 MED ORDER — MAGNESIUM OXIDE -MG SUPPLEMENT 400 (240 MG) MG PO TABS
400.0000 mg | ORAL_TABLET | Freq: Every day | ORAL | Status: DC
  Administered 2021-02-12 – 2021-02-21 (×10): 400 mg via ORAL
  Filled 2021-02-11 (×10): qty 1

## 2021-02-11 MED ORDER — LEVALBUTEROL HCL 1.25 MG/3ML IN NEBU: 1.2500 mg | INHALATION_SOLUTION | Freq: Four times a day (QID) | RESPIRATORY_TRACT | Status: DC | PRN

## 2021-02-11 MED ORDER — ONDANSETRON HCL 4 MG/2ML IJ SOLN: 4.0000 mg | Freq: Four times a day (QID) | INTRAMUSCULAR | Status: DC | PRN

## 2021-02-11 MED ORDER — DOCUSATE SODIUM 100 MG PO CAPS: 100.0000 mg | ORAL_CAPSULE | Freq: Two times a day (BID) | ORAL | Status: DC | PRN

## 2021-02-11 MED ORDER — DILTIAZEM LOAD VIA INFUSION
15.0000 mg | Freq: Once | INTRAVENOUS | Status: AC
  Administered 2021-02-11: 15 mg via INTRAVENOUS
  Filled 2021-02-11: qty 15

## 2021-02-11 MED ORDER — METOPROLOL TARTRATE 5 MG/5ML IV SOLN
5.0000 mg | Freq: Once | INTRAVENOUS | Status: AC
  Administered 2021-02-11: 5 mg via INTRAVENOUS
  Filled 2021-02-11: qty 5

## 2021-02-11 MED ORDER — POLYETHYL GLYCOL-PROPYL GLYCOL 0.4-0.3 % OP GEL: 1.0000 "application " | Freq: Every day | OPHTHALMIC | Status: DC

## 2021-02-11 MED ORDER — ESCITALOPRAM OXALATE 10 MG PO TABS
10.0000 mg | ORAL_TABLET | Freq: Every day | ORAL | Status: DC
  Administered 2021-02-11 – 2021-02-21 (×11): 10 mg via ORAL
  Filled 2021-02-11 (×11): qty 1

## 2021-02-11 MED ORDER — SODIUM CHLORIDE 0.9 % IV SOLN
1.0000 g | Freq: Once | INTRAVENOUS | Status: AC
  Administered 2021-02-11: 1 g via INTRAVENOUS
  Filled 2021-02-11: qty 10

## 2021-02-11 NOTE — H&P (Signed)
History and Physical    Patient: Patricia [name] OMV:[id number] DOB: [birth year] DOA: 02/11/2021 DOS: the patient was seen and examined on 02/11/2021 PCP: [name], MD  Consultants:  Julien Nordmann - oncology; Tammi Klippel - rad onc; [facility]- cardiology; [facility] - pulmonology Patient coming from: Home via EMS  Chief Complaint:  Chief Complaint  Patient presents with   Shortness of Breath    HPI: Patricia [name] is a 71 y.o. female with medical history significant of hypertension, hyperlipidemia, atrial flutter/atrial fibrillation, diastolic CHF last EF 62-83% with grade 1 diastolic dysfunction, small cell lung cancer, pancytopenia secondary to chemo treatment,  Presents with complaints of progressively worsening shortness of breath.  She reports being short of breath for the last 2 months, but notes that it acutely worsened over the last week.   Patient completed chemotherapy of cisplatin and etoposide approximately 2 weeks ago.  She also had completed radiation of her chest, but has to follow-up with radiation oncology to to set her up to have whole brain radiation which she is still somewhat unsure.  Yesterday, she had been transfused 2 units of blood as her labs from 1/25 and 2/1 had noted WBC as low as 0.4, hemoglobin 7.4, and platelets as low as 21. Following the transfusion patient reported feeling much better.  However, this morning around 4 AM she acutely woke up gasping for air.  Reports having associated symptoms of worsening wheezing and productive cough with thick clear sputum.  Denies any hemoptysis.  At home since she started on chemotherapy her heart rates have been in the 130s to 140s at times.  She had been advised to only take her metoprolol 75 mg twice daily instead of 3 times daily due to episodes of low blood pressures last week.  Patient notes that she constantly feels like there is a rubber and ran around her chest.  While in the emergency department was noted to be afebrile,  pulse elevated to 143 in atrial fibrillation, respirations 18-33, blood pressures maintained, and O2 saturations as low as 85% with improvement on 2 L of nasal cannula oxygen.  Labs significant for hemoglobin 10.6, platelets 53, magnesium 1.3, BNP 420.9, and high-sensitivity troponins negative x2.  CT scan of the chest noted progression of bilateral pleural effusions with associated compressive atelectasis and bronchopneumonia felt less likely.  Left lower lobe tumor not well visualized.   Influenza and COVID-19 screening were negative.  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   Review of Systems: As mentioned in the history of present illness. All other systems reviewed and are negative. Past Medical History:  Diagnosis Date   Anemia    Anxiety    GERD (gastroesophageal reflux disease)    Hyperlipidemia    Hypertension    IBS (irritable bowel syndrome)    Insomnia    Low back pain    Lung cancer ([place]) 10/2020   TIA (transient ischemic attack) 10/2017   no deficits   Vitamin B12 deficiency    Vitamin D deficiency    Past Surgical History:  Procedure Laterality Date   BACK SURGERY     BREAST EXCISIONAL BIOPSY Left 1997   benign   BRONCHIAL BIOPSY  10/18/2020   Procedure: BRONCHIAL BIOPSIES;  Surgeon: [name], DO;  Location: [place] [facility];  Service: Pulmonary;;   BRONCHIAL BRUSHINGS  10/18/2020   Procedure: BRONCHIAL BRUSHINGS;  Surgeon: [name], DO;  Location: [place] [facility];  Service: Pulmonary;;   BRONCHIAL NEEDLE  ASPIRATION BIOPSY  10/18/2020   Procedure: BRONCHIAL NEEDLE ASPIRATION BIOPSIES;  Surgeon: [name], DO;  Location: [place] [facility];  Service: Pulmonary;;   BRONCHIAL WASHINGS  10/18/2020   Procedure: BRONCHIAL WASHINGS;  Surgeon: [name], DO;  Location: [place] [facility];  Service: Pulmonary;;   hemorrhoidecotmy     HERNIA MESH REMOVAL     OPEN REDUCTION INTERNAL FIXATION (ORIF) DISTAL RADIAL FRACTURE Right  11/19/2019   Procedure: OPEN REDUCTION INTERNAL FIXATION (ORIF) DISTAL RADIUS AND ULNA FRACTURE WITH REPAIR AS NECESSARY;  Surgeon: Roseanne Kaufman, MD;  Location: [place];  Service: Orthopedics;  Laterality: Right;  2 hrs Block with IV sedation   ORIF RADIUS & ULNA FRACTURES     TONSILLECTOMY     VIDEO BRONCHOSCOPY WITH ENDOBRONCHIAL NAVIGATION Left 10/18/2020   Procedure: VIDEO BRONCHOSCOPY WITH ENDOBRONCHIAL NAVIGATION;  Surgeon: [name], DO;  Location: [place];  Service: Pulmonary;  Laterality: Left;  ION   VIDEO BRONCHOSCOPY WITH ENDOBRONCHIAL ULTRASOUND Bilateral 10/18/2020   Procedure: VIDEO BRONCHOSCOPY WITH ENDOBRONCHIAL ULTRASOUND;  Surgeon: [name], DO;  Location: [place];  Service: Pulmonary;  Laterality: Bilateral;   VIDEO BRONCHOSCOPY WITH RADIAL ENDOBRONCHIAL ULTRASOUND  10/18/2020   Procedure: VIDEO BRONCHOSCOPY WITH RADIAL ENDOBRONCHIAL ULTRASOUND;  Surgeon: [name], DO;  Location: [place] [facility];  Service: Pulmonary;;   Social History:  reports that she quit smoking about 13 months ago. Her smoking use included cigarettes. She started smoking about 51 years ago. She has a 50.00 pack-year smoking history. She has never used smokeless tobacco. She reports that she does not currently use alcohol. She reports that she does not use drugs.  Allergies  Allergen Reactions   Augmentin [Amoxicillin-Pot Clavulanate] Nausea Only and Other (See Comments)    GI upset   Levaquin [Levofloxacin In D5w] Other (See Comments)    Joint problems    Magnesium Hydroxide Other (See Comments)    Welts  Other reaction(s): welts   Oxycodone Other (See Comments)    Nausea    Other Itching    Paper tape    Family History  Problem Relation Age of Onset   Breast cancer Sister    Breast cancer Maternal Aunt    Breast cancer Paternal Aunt     Prior to Admission medications   Medication Sig Start Date End Date Taking? Authorizing Provider   carboxymethylcellulose (REFRESH PLUS) 0.5 % SOLN Place 1 drop into both eyes 3 (three) times daily as needed (for dryness).     [provider]  carisoprodol (SOMA) 350 MG tablet Take 350 mg by mouth 3 (three) times daily.  09/26/17   [provider]  Cholecalciferol (VITAMIN D3) 50 MCG (2000 UT) TABS Take 2,000 Units by mouth daily.    [provider]  clobetasol ointment (TEMOVATE) 2.37 % Apply 1 application topically See admin instructions. Apply to vaginal area daily as directed    [provider]  clonazePAM (KLONOPIN) 1 MG tablet Take 1 mg by mouth at bedtime.  10/15/17   [provider]  diltiazem (CARDIZEM CD) 360 MG 24 hr capsule Take 1 capsule (360 mg total) by mouth daily. 01/06/21 04/06/21  Baldwin Jamaica, PA-C  docusate sodium (COLACE) 100 MG capsule Take 1 capsule (100 mg total) by mouth 2 (two) times daily. Patient taking differently: Take 100 mg by mouth 2 (two) times daily as needed for mild constipation or moderate constipation. 01/03/21   Pokhrel, Laxman, MD  ELIQUIS 5 MG TABS tablet TAKE 1 TABLET(5 MG)  BY MOUTH TWICE DAILY Patient taking differently: Take 5 mg by mouth 2 (two) times daily. 10/03/20   Camnitz, Will Hassell Done, MD  escitalopram (LEXAPRO) 10 MG tablet Take 1 tablet (10 mg total) by mouth daily. 01/04/21   Pokhrel, Corrie Mckusick, MD  fluticasone (FLONASE) 50 MCG/ACT nasal spray Place 2 sprays into both nostrils daily as needed for allergies or rhinitis.  10/11/17   [provider]  furosemide (LASIX) 20 MG tablet Take 1 tablet (20 mg total) by mouth daily. 10/20/20 01/18/21  Terrilee Croak, MD  gabapentin (NEURONTIN) 600 MG tablet Take 600 mg by mouth See admin instructions. Take 600 mg by mouth in the morning and at lunchtime 09/16/17   [provider]  guaiFENesin-dextromethorphan (ROBITUSSIN DM) 100-10 MG/5ML syrup Take 10 mLs by mouth every 4 (four) hours as needed for cough. 01/03/21   Pokhrel, Corrie Mckusick, MD   HYDROcodone-acetaminophen (NORCO) 7.5-325 MG tablet Take 1 tablet by mouth every 6 (six) hours as needed for severe pain. Patient not taking: Reported on 01/24/2021 12/18/20   Sherwood Gambler, MD  ipratropium (ATROVENT) 0.02 % nebulizer solution Take by nebulization 3 (three) times daily. As directed 01/05/21   [provider]  levalbuterol Penne Lash) 1.25 MG/3ML nebulizer solution Inhale into the lungs as directed. 01/10/21   [provider]  lidocaine (XYLOCAINE) 2 % solution Use as directed 15 mLs in the mouth or throat every 6 (six) hours as needed for mouth pain. Patient not taking: Reported on 02/08/2021 12/18/20   Sherwood Gambler, MD  linaclotide [facility]) 145 MCG CAPS capsule Take 1 capsule (145 mcg total) by mouth daily at 4 PM. Patient not taking: Reported on 02/08/2021 10/03/20   Debbe Odea, MD  magic mouthwash SOLN Take 5 mLs by mouth 4 (four) times daily as needed for mouth pain. Pt is allergic to Magnesium Patient not taking: Reported on 12/25/2020 11/04/20   Sherol Dade E, PA-C  magnesium oxide (MAG-OX) 400 (240 Mg) MG tablet Take 1 tablet (400 mg total) by mouth daily. 11/08/20   Heilingoetter, Cassandra L, PA-C  Metoprolol Tartrate 75 MG TABS Take 75 mg by mouth every 8 (eight) hours. 01/06/21 04/15/21  Baldwin Jamaica, PA-C  nystatin (MYCOSTATIN) 100000 UNIT/ML suspension Take 5 mLs (500,000 Units total) by mouth 4 (four) times daily. Patient not taking: Reported on 02/08/2021 12/16/20   Tyler Pita, MD  omeprazole (PRILOSEC) 20 MG capsule Take 20 mg by mouth daily before breakfast.  08/27/17   [provider]  ondansetron (ZOFRAN-ODT) 8 MG disintegrating tablet Take 8 mg by mouth daily as needed for nausea. Patient not taking: Reported on 02/08/2021 10/13/20   [provider]  polyethylene glycol (MIRALAX / GLYCOLAX) packet Take 34 g by mouth in the morning.  Patient not taking: Reported on 02/08/2021    [provider]   prochlorperazine (COMPAZINE) 10 MG tablet Take 1 tablet (10 mg total) by mouth every 6 (six) hours as needed for nausea or vomiting. 10/27/20   Curt Bears, MD  rosuvastatin (CRESTOR) 5 MG tablet Take 5 mg by mouth 3 (three) times a week. Mondays Wednesdays Fridays    [provider]  sodium chloride 1 g tablet Take 1 g by mouth 3 (three) times daily. 11/26/20   [provider]  traMADol (ULTRAM) 50 MG tablet Take 100 mg by mouth in the morning, at noon, and at bedtime. 10/08/19   [provider]  zaleplon (SONATA) 10 MG capsule Take 10 mg by mouth at bedtime as needed (for  interrupted sleep).     [provider]    Physical Exam: Vitals:   02/11/21 0830 02/11/21 0900 02/11/21 0930 02/11/21 1000  BP: (!) 134/101 136/89 (!) 130/98 (!) 126/94  Pulse: (!) 142 (!) 142 (!) 140 (!) 142  Resp: (!) 21 18 18 18   Temp:      TempSrc:      SpO2: 100% 98% 100% 100%  Weight:      Height:       Exam  Constitutional: Elderly female who appears chronically ill, but able to follow commands Eyes: PERRL, lids and conjunctivae normal ENMT: Lips were dry.  Posterior pharynx clear of any exudate or lesions.  Neck: normal, supple, no masses. Respiratory: Normal respiratory effort currently off of BiPAP with crackles heard in the mid to lower lung fields bilaterally.  No significant wheezes appreciated at this time.  Patient able to talk in relatively complete sentences. Cardiovascular: Irregular with fast rate.  1+  pitting lower extremity edema. No carotid bruits.  Abdomen: no tenderness, no masses palpated. Bowel sounds positive.  Musculoskeletal: no clubbing / cyanosis. No joint deformity upper and lower extremities. Good ROM, no contractures. Normal muscle tone.  Skin: Skin changes noted on the left side of the back related with radiation treatment Neurologic: CN 2-12 grossly intact.  Able to move all extremities Psychiatric: Normal judgment and insight. Alert and  oriented x 3. Normal mood.    Data Reviewed:  CT scan of the chest noted progression of bilateral pleural effusions with associated compressive atelectasis and bronchopneumonia felt less likely.  Left lower lobe tumor not well visualized.    Assessment and Plan: * Acute on chronic respiratory failure with hypoxia (HCC) secondary to acute on chronic diastolic congestive heart failure exacerbation- (present on admission) Patient presents with progressively worsening shortness of breath acutely worsened this morning at 4 AM after she received 2 units of PRBCs yesterday.  O2 saturations reported to be as low as 85% on home 2 L of oxygen.  Patient was temporarily placed on BiPAP due to respiratory distress.  Imaging noted worsening moderate bilateral pleural effusions with lower suspicion for possible pneumonia.Marland Kitchen  BNP was elevated at 420.9.  Last EF was 60 - 65% with grade 1 diastolic dysfunction back in 09/2020.  Suspect fluid overloaded likely related with recent blood transfusion.  Patient was given furosemide 40 mg IV.  She had also been started on empiric antibiotics of Rocephin and doxycycline. -Admit to a progressive bed -Continuous pulse oximetry with oxygen to maintain O2 saturation greater 92% -Strict I&Os and daily weights -Check procalcitonin.  Antibiotics were not continued as low suspicion for infection at this time.  Case has been discussed with Dr.Icard who is her pulmonologist and recommended -Continue furosemide 40 mg IV twice daily -Continue to monitor and adjust diuresis as needed -Defer need of repeat echocardiogram to cardiology.  AF (paroxysmal atrial fibrillation) ([place])- (present on admission) Patient was seen to be in A. fib with RVR with heart rates into the 140s.  She notes that her heart rates have been elevated into the 140s since she has elevated since she had been started on chemotherapy.  Home regimen included Diltiazem 360 mg daily and metoprolol 75 mg every 8 hours, but  had recently gotten switched to twice daily due to episodes of hypotension.  Patient was given metoprolol 5 mg IV, diltiazem 15 mg IV, and started on Cardizem drip.   -Continue Cardizem drip  -Appreciate cardiology consultative services, will defer to  them for medication regimen adjustment  Antineoplastic chemotherapy induced pancytopenia ([place])- (present on admission) On admission WBC 6.1, hemoglobin 10.6, platelet count 53.  Patient had just received 2 units of packed red blood cells yesterday.  After hemoglobin had been down to 7.4 and platelets recently. 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  Followed by Dr. Julien Nordmann of oncology and Tammi Klippel of radiation oncology in outpatient setting.  She had completed chemotherapy treatments of cisplatin and etoposide approximately 2 weeks ago.  She has received radiation treatment of her chest and there are plans for whole brain radiation in the near future. -Dr. Julien Nordmann added to the care team.  Note Dr. Julien Nordmann we will be out of the office through 2/6 -Message sent to Dr. Tammi Klippel regarding admission  Hypomagnesemia- (present on admission) On admission magnesium was noted to be 1.3. -Give 2 g of magnesium sulfate IV -Continue scheduled magnesium 400 mg daily.  Dyslipidemia- (present on admission) - Continue Crestor  Anxiety- (present on admission) - Continue lexapro and clonazepam       Advance Care Planning:   Code Status: DNR   Consults: Cardiology  Family Communication: Husband updated at bedside  Severity of Illness: The appropriate patient status for this patient is INPATIENT. Inpatient status is judged to be reasonable and necessary in order to provide the required intensity of service to ensure the patient's  safety. The patient's presenting symptoms, physical exam findings, and initial radiographic and laboratory data in the context of their chronic comorbidities is felt to place them at high risk for further clinical deterioration. Furthermore, it is not anticipated that the patient will be medically stable for discharge from the hospital within 2 midnights of admission.   * I certify that at the point of admission it is my clinical judgment that the patient will require inpatient hospital care spanning beyond 2 midnights from the point of admission due to high intensity of service, high risk for further deterioration and high frequency of surveillance required.*  Author: Norval Morton, MD 02/11/2021 10:17 AM  For on call review [email or web address].

## 2021-02-11 NOTE — Consult Note (Signed)
NAME:  Patricia [name], MRN:  [id number], DOB:  [birth year], LOS: 0 ADMISSION DATE:  02/11/2021, CONSULTATION DATE:  02/11/2021 REFERRING MD:  Dr. 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 Julien Nordmann as well as Ppx brain radiation. Recently with increasing SOB at home. This morning she felt like she was gasping and was brought in by EMS. CT completed with worsening BL pleural effusion. Her BNP is elevated. Also in afib with rapid heart rate.   Pertinent  Medical History   Past Medical History:  Diagnosis Date   Anemia    Anxiety    GERD (gastroesophageal reflux disease)    Hyperlipidemia    Hypertension    IBS (irritable bowel syndrome)    Insomnia    Low back pain    Lung cancer ([place]) 10/2020   TIA (transient ischemic attack) 10/2017   no deficits   Vitamin B12 deficiency    Vitamin D deficiency      Significant Hospital Events: Including procedures, antibiotic start and stop dates in addition to other pertinent events     Interim History / Subjective:  Sob, worse over the past few days  Decreased UOP at home   Objective   Blood pressure (!) 129/106, pulse (!) 141, temperature 98.1 F (36.7 C), temperature source Oral, resp. rate (!) 22, height 5\' 6"  (1.676 m), weight 84.6 kg, SpO2 95 %.        Intake/Output Summary (Last 24 hours) at 02/11/2021 1130 Last data filed at 02/11/2021 1040 Gross per 24 hour  Intake 150 ml  Output 1100 ml  Net -950 ml   Filed Weights   02/11/21 0714  Weight: 84.6 kg    Examination: General: chronically ill appearing fm, in bed  HENT: NCAT tracking  Lungs: diminished in the base, no wheeze  Cardiovascular: irregular, tachy  Abdomen: obese, soft, nd  Extremities: no significant edema  Neuro: alert following commands  GU: deferred   Resolved Hospital Problem list     Assessment & Plan:    Acute  Diastolic Heart Failure Bilateral pleural effusions Limited staged small cell, s/p chemo+Xrt Pancytopenias, improved but related to recent chemo Tachy, irregular rhythm, Afib RVR P Admit to medicine service Would hold off on abx unless has fevers or any sign of infection  No reason for steroids either  Seems to be mainly a volume issue  Diuresis  Heart rate control  Follow cell counts but they look ok today   Pulm will see as needed. Thanks for letting us know she is here in the hospital.  Call us if anything changes.    Labs   CBC: Recent Labs  Lab 02/08/21 0820 02/11/21 0724  WBC 3.1* 6.1  NEUTROABS 2.3 5.3  HGB 7.4* 10.6*  HCT 21.6* 31.2*  MCV 103.8* 98.1  PLT 41* 53*    Basic Metabolic Panel: Recent Labs  Lab 02/08/21 0820 02/11/21 0719  NA 138 137  K 4.0 4.0  CL 99 99  CO2 31 31  GLUCOSE 102* 115*  BUN 16 14  CREATININE 0.76 0.86  CALCIUM 8.7* 8.4*  MG 1.3* 1.3*   GFR: Estimated Creatinine Clearance: 65.7 mL/min (by C-G formula based on SCr of 0.86 mg/dL). Recent Labs  Lab 02/08/21 0820 02/11/21 0724  WBC 3.1* 6.1    Liver Function Tests: Recent Labs  Lab  02/08/21 0820  AST 21  ALT 23  ALKPHOS 94  BILITOT 0.4  PROT 6.2*  ALBUMIN 3.7   No results for input(s): LIPASE, AMYLASE in the last 168 hours. No results for input(s): AMMONIA in the last 168 hours.  ABG    Component Value Date/Time   TCO2 21 11/07/2008 2249     Coagulation Profile: No results for input(s): INR, PROTIME in the last 168 hours.  Cardiac Enzymes: No results for input(s): CKTOTAL, CKMB, CKMBINDEX, TROPONINI in the last 168 hours.  HbA1C: No results found for: HGBA1C  CBG: No results for input(s): GLUCAP in the last 168 hours.  Review of Systems:   Review of Systems  Constitutional:  Negative for chills, fever, malaise/fatigue and weight loss.  HENT:  Negative for hearing loss, sore throat and tinnitus.   Eyes:  Negative for blurred vision and double  vision.  Respiratory:  Positive for cough and shortness of breath. Negative for hemoptysis, sputum production, wheezing and stridor.   Cardiovascular:  Negative for chest pain, palpitations, orthopnea, leg swelling and PND.  Gastrointestinal:  Negative for abdominal pain, constipation, diarrhea, heartburn, nausea and vomiting.  Genitourinary:  Negative for dysuria, hematuria and urgency.  Musculoskeletal:  Negative for joint pain and myalgias.  Skin:  Negative for itching and rash.  Neurological:  Positive for weakness. Negative for dizziness, tingling and headaches.  Endo/Heme/Allergies:  Negative for environmental allergies. Does not bruise/bleed easily.  Psychiatric/Behavioral:  Negative for depression. The patient is nervous/anxious. The patient does not have insomnia.   All other systems reviewed and are negative.   Past Medical History:  She,  has a past medical history of Anemia, Anxiety, GERD (gastroesophageal reflux disease), Hyperlipidemia, Hypertension, IBS (irritable bowel syndrome), Insomnia, Low back pain, Lung cancer ([place]) (10/2020), TIA (transient ischemic attack) (10/2017), Vitamin B12 deficiency, and Vitamin D deficiency.   Surgical History:   Past Surgical History:  Procedure Laterality Date   BACK SURGERY     BREAST EXCISIONAL BIOPSY Left 1997   benign   BRONCHIAL BIOPSY  10/18/2020   Procedure: BRONCHIAL BIOPSIES;  Surgeon: [name], DO;  Location: [facility];  Service: Pulmonary;;   BRONCHIAL BRUSHINGS  10/18/2020   Procedure: BRONCHIAL BRUSHINGS;  Surgeon: [name], DO;  Location: [facility];  Service: Pulmonary;;   BRONCHIAL NEEDLE ASPIRATION BIOPSY  10/18/2020   Procedure: BRONCHIAL NEEDLE ASPIRATION BIOPSIES;  Surgeon: [name], DO;  Location: [facility];  Service: Pulmonary;;   BRONCHIAL WASHINGS  10/18/2020   Procedure: BRONCHIAL WASHINGS;  Surgeon: [name], DO;  Location: [facility];  Service: Pulmonary;;    hemorrhoidecotmy     HERNIA MESH REMOVAL     OPEN REDUCTION INTERNAL FIXATION (ORIF) DISTAL RADIAL FRACTURE Right 11/19/2019   Procedure: OPEN REDUCTION INTERNAL FIXATION (ORIF) DISTAL RADIUS AND ULNA FRACTURE WITH REPAIR AS NECESSARY;  Surgeon: Roseanne Kaufman, MD;  Location: [facility];  Service: Orthopedics;  Laterality: Right;  2 hrs Block with IV sedation   ORIF RADIUS & ULNA FRACTURES     TONSILLECTOMY     VIDEO BRONCHOSCOPY WITH ENDOBRONCHIAL NAVIGATION Left 10/18/2020   Procedure: VIDEO BRONCHOSCOPY WITH ENDOBRONCHIAL NAVIGATION;  Surgeon: [name], DO;  Location: [facility];  Service: Pulmonary;  Laterality: Left;  ION   VIDEO BRONCHOSCOPY WITH ENDOBRONCHIAL ULTRASOUND Bilateral 10/18/2020   Procedure: VIDEO BRONCHOSCOPY WITH ENDOBRONCHIAL ULTRASOUND;  Surgeon: [name], DO;  Location: [place] [facility];  Service: Pulmonary;  Laterality: Bilateral;   VIDEO BRONCHOSCOPY WITH RADIAL ENDOBRONCHIAL ULTRASOUND  10/18/2020   Procedure: VIDEO BRONCHOSCOPY WITH RADIAL ENDOBRONCHIAL ULTRASOUND;  Surgeon: [name], DO;  Location: [facility];  Service: Pulmonary;;     Social History:   reports that she quit smoking about 13 months ago. Her smoking use included cigarettes. She started smoking about 51 years ago. She has a 50.00 pack-year smoking history. She has never used smokeless tobacco. She reports that she does not currently use alcohol. She reports that she does not use drugs.   Family History:  Her family history includes Breast cancer in her maternal aunt, paternal aunt, and sister.   Allergies Allergies  Allergen Reactions   Augmentin [Amoxicillin-Pot Clavulanate] Nausea Only and Other (See Comments)    GI upset   Levaquin [Levofloxacin In D5w] Other (See Comments)    Joint problems    Magnesium Hydroxide Other (See Comments)    Welts  Other reaction(s): welts   Oxycodone Other (See Comments)    Nausea    Other Itching    Paper tape     Home  Medications  Prior to Admission medications   Medication Sig Start Date End Date Taking? Authorizing Provider  carboxymethylcellulose (REFRESH PLUS) 0.5 % SOLN Place 1 drop into both eyes 3 (three) times daily as needed (for dryness).     [provider]  carisoprodol (SOMA) 350 MG tablet Take 350 mg by mouth 3 (three) times daily.  09/26/17   [provider]  Cholecalciferol (VITAMIN D3) 50 MCG (2000 UT) TABS Take 2,000 Units by mouth daily.    [provider]  clobetasol ointment (TEMOVATE) 4.09 % Apply 1 application topically See admin instructions. Apply to vaginal area daily as directed    [provider]  clonazePAM (KLONOPIN) 1 MG tablet Take 1 mg by mouth at bedtime.  10/15/17   [provider]  diltiazem (CARDIZEM CD) 360 MG 24 hr capsule Take 1 capsule (360 mg total) by mouth daily. 01/06/21 04/06/21  Baldwin Jamaica, PA-C  docusate sodium (COLACE) 100 MG capsule Take 1 capsule (100 mg total) by mouth 2 (two) times daily. Patient taking differently: Take 100 mg by mouth 2 (two) times daily as needed for mild constipation or moderate constipation. 01/03/21   Pokhrel, Laxman, MD  ELIQUIS 5 MG TABS tablet TAKE 1 TABLET(5 MG) BY MOUTH TWICE DAILY Patient taking differently: Take 5 mg by mouth 2 (two) times daily. 10/03/20   Camnitz, Will Hassell Done, MD  escitalopram (LEXAPRO) 10 MG tablet Take 1 tablet (10 mg total) by mouth daily. 01/04/21   Pokhrel, Corrie Mckusick, MD  fluticasone (FLONASE) 50 MCG/ACT nasal spray Place 2 sprays into both nostrils daily as needed for allergies or rhinitis.  10/11/17   [provider]  furosemide (LASIX) 20 MG tablet Take 1 tablet (20 mg total) by mouth daily. 10/20/20 01/18/21  Terrilee Croak, MD  gabapentin (NEURONTIN) 600 MG tablet Take 600 mg by mouth See admin instructions. Take 600 mg by mouth in the morning and at lunchtime 09/16/17   [provider]  guaiFENesin-dextromethorphan (ROBITUSSIN DM) 100-10 MG/5ML  syrup Take 10 mLs by mouth every 4 (four) hours as needed for cough. 01/03/21   Pokhrel, Corrie Mckusick, MD  HYDROcodone-acetaminophen (NORCO) 7.5-325 MG tablet Take 1 tablet by mouth every 6 (six) hours as needed for severe pain. Patient not taking: Reported on 01/24/2021 12/18/20   Sherwood Gambler, MD  ipratropium (ATROVENT) 0.02 % nebulizer solution Take by nebulization 3 (three) times daily. As directed 01/05/21   [provider]  levalbuterol (  XOPENEX) 1.25 MG/3ML nebulizer solution Inhale into the lungs as directed. 01/10/21   [provider]  lidocaine (XYLOCAINE) 2 % solution Use as directed 15 mLs in the mouth or throat every 6 (six) hours as needed for mouth pain. Patient not taking: Reported on 02/08/2021 12/18/20   Sherwood Gambler, MD  linaclotide [facility]) 145 MCG CAPS capsule Take 1 capsule (145 mcg total) by mouth daily at 4 PM. Patient not taking: Reported on 02/08/2021 10/03/20   Debbe Odea, MD  magic mouthwash SOLN Take 5 mLs by mouth 4 (four) times daily as needed for mouth pain. Pt is allergic to Magnesium Patient not taking: Reported on 12/25/2020 11/04/20   Sherol Dade E, PA-C  magnesium oxide (MAG-OX) 400 (240 Mg) MG tablet Take 1 tablet (400 mg total) by mouth daily. 11/08/20   Heilingoetter, Cassandra L, PA-C  Metoprolol Tartrate 75 MG TABS Take 75 mg by mouth every 8 (eight) hours. 01/06/21 04/15/21  Baldwin Jamaica, PA-C  nystatin (MYCOSTATIN) 100000 UNIT/ML suspension Take 5 mLs (500,000 Units total) by mouth 4 (four) times daily. Patient not taking: Reported on 02/08/2021 12/16/20   Tyler Pita, MD  omeprazole (PRILOSEC) 20 MG capsule Take 20 mg by mouth daily before breakfast.  08/27/17   [provider]  ondansetron (ZOFRAN-ODT) 8 MG disintegrating tablet Take 8 mg by mouth daily as needed for nausea. Patient not taking: Reported on 02/08/2021 10/13/20   [provider]  polyethylene glycol (MIRALAX / GLYCOLAX) packet Take 34 g by mouth in  the morning.  Patient not taking: Reported on 02/08/2021    [provider]  prochlorperazine (COMPAZINE) 10 MG tablet Take 1 tablet (10 mg total) by mouth every 6 (six) hours as needed for nausea or vomiting. 10/27/20   Curt Bears, MD  rosuvastatin (CRESTOR) 5 MG tablet Take 5 mg by mouth 3 (three) times a week. Mondays Wednesdays Fridays    [provider]  sodium chloride 1 g tablet Take 1 g by mouth 3 (three) times daily. 11/26/20   [provider]  traMADol (ULTRAM) 50 MG tablet Take 100 mg by mouth in the morning, at noon, and at bedtime. 10/08/19   [provider]  zaleplon (SONATA) 10 MG capsule Take 10 mg by mouth at bedtime as needed (for interrupted sleep).     [provider]     [name], DO [facility] 02/11/2021 11:31 AM

## 2021-02-11 NOTE — Assessment & Plan Note (Addendum)
On sertraline and escitalopram.  Continue with clonazepam.

## 2021-02-11 NOTE — Assessment & Plan Note (Addendum)
Hypokalemia. Hyponatremia, hypomagnesemia.  Unfortunately patient did not tolerate urea po  Salt tablets have been discontinued, patient will have a close follow up on electrolytes per cardiology office.

## 2021-02-11 NOTE — Progress Notes (Signed)
° ° °  HR remains unchaged despite max dose diltiazem and metoprolol. Will d/c cardizem gtts and switch to IV amiodarone. She has been on Eliquis for about 1 month, after interrupting it in December 2022 during chemotherapy. May need to pursue DCCV during this admission.  Pixie Casino, MD, [employer], [place] Director of the [facility] Diplomate of the American Board of Clinical Lipidology Attending Cardiologist  Direct Dial: [phone]   Fax: [phone]  Website:  [email or web address]

## 2021-02-11 NOTE — Assessment & Plan Note (Addendum)
Volume status and oxygenation improved with diuresis.  At discharge patient with no dyspnea, her oxymetry is 92% on 2 L/min per [place].  Continue with home 02.

## 2021-02-11 NOTE — Consult Note (Signed)
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 fib, now presents with regular narrow complex tachycardia  HPI   Patricia [name] is a 71 y.o. female who is being seen today for the evaluation of tachycardia at the request of Dr. Tamala Julian.  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 Curt Bears on January 17 for evaluation of SVT.  She had worn a monitor which showed multiple episodes of SVT and was treated with adenosine in the emergency room as well as vagal maneuvers.  An additional monitor showed a 2% A. fib burden and she was started on Eliquis.  In December 2022 she was hospitalized due to esophagitis possibly related to XRT and went into A. fib with rapid ventricular response.  She was discharged on rate controlling meds at that time.  He felt that her SVT was likely an atrial tachycardia.  She is also had persistent A. fib and or flutter with a CHA2DS2-VASc score of 5 and maintained on Eliquis.  Currently she reports being unaware of her tachycardia but is in a regular tachycardia with rate around 150 suggestive of atrial flutter or perhaps atrial tachycardia.  She reports she had recent lower extremity edema but has been given increasing doses of Lasix.  Her last echo was in September 2022 which showed LVEF 60 to 65% and grade 1 diastolic dysfunction.  PMHx   Past Medical History:  Diagnosis Date   Anemia    Anxiety    GERD (gastroesophageal reflux disease)    Hyperlipidemia    Hypertension    IBS (irritable bowel syndrome)    Insomnia    Low back pain    Lung  cancer ([place]) 10/2020   TIA (transient ischemic attack) 10/2017   no deficits   Vitamin B12 deficiency    Vitamin D deficiency     Past Surgical History:  Procedure Laterality Date   BACK SURGERY     BREAST EXCISIONAL BIOPSY Left 1997   benign   BRONCHIAL BIOPSY  10/18/2020   Procedure: BRONCHIAL BIOPSIES;  Surgeon: [name], DO;  Location: [facility];  Service: Pulmonary;;   BRONCHIAL BRUSHINGS  10/18/2020   Procedure: BRONCHIAL BRUSHINGS;  Surgeon: [name], DO;  Location: [facility];  Service: Pulmonary;;   BRONCHIAL NEEDLE ASPIRATION BIOPSY  10/18/2020   Procedure: BRONCHIAL NEEDLE ASPIRATION BIOPSIES;  Surgeon: [name], DO;  Location: [facility];  Service: Pulmonary;;   BRONCHIAL WASHINGS  10/18/2020   Procedure: BRONCHIAL WASHINGS;  Surgeon: [name], DO;  Location: [facility];  Service: Pulmonary;;   hemorrhoidecotmy     HERNIA MESH REMOVAL     OPEN REDUCTION INTERNAL FIXATION (ORIF) DISTAL RADIAL FRACTURE Right 11/19/2019   Procedure: OPEN REDUCTION INTERNAL FIXATION (ORIF) DISTAL RADIUS AND ULNA FRACTURE WITH REPAIR AS NECESSARY;  Surgeon: Roseanne Kaufman, MD;  Location: [facility];  Service: Orthopedics;  Laterality: Right;  2 hrs Block with IV sedation   ORIF RADIUS & ULNA FRACTURES     TONSILLECTOMY     VIDEO BRONCHOSCOPY WITH ENDOBRONCHIAL NAVIGATION Left 10/18/2020   Procedure: VIDEO BRONCHOSCOPY WITH ENDOBRONCHIAL NAVIGATION;  Surgeon: [name], DO;  Location: [facility];  Service: Pulmonary;  Laterality: Left;  ION   VIDEO BRONCHOSCOPY WITH ENDOBRONCHIAL ULTRASOUND Bilateral 10/18/2020   Procedure: VIDEO BRONCHOSCOPY WITH ENDOBRONCHIAL ULTRASOUND;  Surgeon: [name], DO;  Location: [facility];  Service: Pulmonary;  Laterality: Bilateral;   VIDEO BRONCHOSCOPY WITH RADIAL ENDOBRONCHIAL ULTRASOUND  10/18/2020   Procedure: VIDEO BRONCHOSCOPY WITH RADIAL ENDOBRONCHIAL ULTRASOUND;  Surgeon: [name], DO;   Location: [facility];  Service: Pulmonary;;    FAMHx   Family History  Problem Relation Age of Onset   Breast cancer Sister    Breast cancer Maternal Aunt    Breast cancer Paternal Aunt     SOCHx    reports that she quit smoking about 13 months ago. Her smoking use included cigarettes. She started smoking about 51 years ago. She has a 50.00 pack-year smoking history. She has never used smokeless tobacco. She reports that she does not currently use alcohol. She reports that she does not use drugs.  Outpatient Medications   No current facility-administered medications on file prior to encounter.   Current Outpatient Medications on File Prior to Encounter  Medication Sig Dispense Refill   carboxymethylcellulose (REFRESH PLUS) 0.5 % SOLN Place 1 drop into both eyes 3 (three) times daily as needed (for dryness).      carisoprodol (SOMA) 350 MG tablet Take 350 mg by mouth 3 (three) times daily.   5   Cholecalciferol (VITAMIN D3) 50 MCG (2000 UT) TABS Take 2,000 Units by mouth daily.     clobetasol ointment (TEMOVATE) 6.94 % Apply 1 application topically See admin instructions. Apply to vaginal area daily as directed     clonazePAM (KLONOPIN) 1 MG tablet Take 1 mg by mouth at bedtime.   3   diltiazem (CARDIZEM CD) 360 MG 24 hr capsule Take 1 capsule (360 mg total) by mouth daily. 90 capsule 2   docusate sodium (COLACE) 100 MG capsule Take 1 capsule (100 mg total) by mouth 2 (two) times daily. (Patient taking differently: Take 100 mg by mouth 2 (two) times daily as needed for mild constipation or moderate constipation.) 10 capsule 0   ELIQUIS 5 MG TABS tablet TAKE 1 TABLET(5 MG) BY MOUTH TWICE DAILY (Patient taking differently: Take 5 mg by mouth 2 (two) times daily.) 60 tablet 6   escitalopram (LEXAPRO) 10 MG tablet Take 1 tablet (10 mg total) by mouth daily. 30 tablet 2   fluticasone (FLONASE) 50 MCG/ACT nasal spray Place 2 sprays into both nostrils daily as needed for allergies or  rhinitis.   5   furosemide (LASIX) 20 MG tablet Take 1 tablet (20 mg total) by mouth daily. 30 tablet 2   gabapentin (NEURONTIN) 600 MG tablet Take 600 mg by mouth See admin instructions. Take 600 mg by mouth in the morning and at lunchtime     guaiFENesin-dextromethorphan (ROBITUSSIN DM) 100-10 MG/5ML syrup Take 10 mLs by mouth every 4 (four) hours as needed for cough. 118 mL 0   ipratropium (ATROVENT) 0.02 % nebulizer solution Take by nebulization 3 (three) times daily. As directed     levalbuterol (XOPENEX) 1.25 MG/3ML nebulizer solution Inhale into the lungs as directed.     lidocaine (XYLOCAINE) 2 % solution Use as directed 15 mLs in the mouth or throat every 6 (six) hours as needed for mouth pain. 100 mL 0   linaclotide (LINZESS) 145 MCG CAPS capsule Take 1 capsule (145 mcg total) by mouth daily at 4 PM.  30 capsule 0   magic mouthwash SOLN Take 5 mLs by mouth 4 (four) times daily as needed for mouth pain. Pt is allergic to Magnesium 240 mL 1   magnesium oxide (MAG-OX) 400 (240 Mg) MG tablet Take 1 tablet (400 mg total) by mouth daily. 30 tablet 1   Metoprolol Tartrate 75 MG TABS Take 75 mg by mouth every 8 (eight) hours. (Patient taking differently: Take 75 mg by mouth 2 (two) times daily.) 90 tablet 2   nystatin (MYCOSTATIN) 100000 UNIT/ML suspension Take 5 mLs (500,000 Units total) by mouth 4 (four) times daily. 440 mL 5   omeprazole (PRILOSEC) 20 MG capsule Take 20 mg by mouth daily before breakfast.      ondansetron (ZOFRAN-ODT) 8 MG disintegrating tablet Take 8 mg by mouth daily as needed for nausea.     Polyethyl Glycol-Propyl Glycol (SYSTANE) 0.4-0.3 % GEL ophthalmic gel Place 1 application into both eyes at bedtime.     polyethylene glycol (MIRALAX / GLYCOLAX) packet Take 34 g by mouth in the morning.     prochlorperazine (COMPAZINE) 10 MG tablet Take 1 tablet (10 mg total) by mouth every 6 (six) hours as needed for nausea or vomiting. 30 tablet 0   rosuvastatin (CRESTOR) 5 MG tablet  Take 5 mg by mouth 3 (three) times a week. Mondays Wednesdays Fridays     sodium chloride 1 g tablet Take 1 g by mouth 3 (three) times daily.     traMADol (ULTRAM) 50 MG tablet Take 100 mg by mouth in the morning, at noon, and at bedtime.     zaleplon (SONATA) 10 MG capsule Take 10 mg by mouth at bedtime as needed (for interrupted sleep).       Inpatient Medications    Scheduled Meds:  apixaban  5 mg Oral BID   escitalopram  10 mg Oral Daily   furosemide  40 mg Intravenous BID   [START ON 02/12/2021] magnesium oxide  400 mg Oral Daily   [START ON 02/13/2021] rosuvastatin  5 mg Oral Once per day on Mon Wed Fri   sodium chloride flush  3 mL Intravenous Q12H    Continuous Infusions:  diltiazem (CARDIZEM) infusion 15 mg/hr (02/11/21 1207)   doxycycline (VIBRAMYCIN) IV 100 mg (02/11/21 1026)    PRN Meds: acetaminophen **OR** acetaminophen, guaiFENesin-dextromethorphan, ondansetron **OR** ondansetron (ZOFRAN) IV, polyvinyl alcohol, traMADol   ALLERGIES   Allergies  Allergen Reactions   Augmentin [Amoxicillin-Pot Clavulanate] Nausea Only and Other (See Comments)    GI upset   Levaquin [Levofloxacin In D5w] Other (See Comments)    Joint problems    Magnesium Hydroxide Other (See Comments)    Welts  Other reaction(s): welts   Oxycodone Other (See Comments)    Nausea    Other Itching    Paper tape    ROS   Pertinent items noted in HPI and remainder of comprehensive ROS otherwise negative.  Vitals   Vitals:   02/11/21 1000 02/11/21 1040 02/11/21 1050 02/11/21 1100  BP: (!) 126/94 (!) 138/91 (!) 129/106 (!) 126/95  Pulse: (!) 142 (!) 142 (!) 141 (!) 140  Resp: 18 (!) 22 (!) 22 (!) 23  Temp:      TempSrc:      SpO2: 100% 96% 95% 96%  Weight:      Height:        Intake/Output Summary (Last 24 hours) at 02/11/2021 1225 Last data filed at 02/11/2021 1040 Gross per 24 hour  Intake 150 ml  Output  1100 ml  Net -950 ml   Filed Weights   02/11/21 0714  Weight: 84.6 kg     Physical Exam   General appearance: alert, no distress, and total alopecia Neck: no carotid bruit, no JVD, and thyroid not enlarged, symmetric, no tenderness/mass/nodules Lungs: diminished breath sounds bibasilar Heart: Regular tachycardia Abdomen: soft, non-tender; bowel sounds normal; no masses,  no organomegaly Extremities: edema 1+ bilateral Pulses: 2+ and symmetric Skin: Pale, cool, dry Neurologic: Grossly normal Psych: Pleasant  Labs   Results for orders placed or performed during the hospital encounter of 02/11/21 (from the past 48 hour(s))  Basic metabolic panel     Status: Abnormal   Collection Time: 02/11/21  7:19 AM  Result Value Ref Range   Sodium 137 135 - 145 mmol/L   Potassium 4.0 3.5 - 5.1 mmol/L   Chloride 99 98 - 111 mmol/L   CO2 31 22 - 32 mmol/L   Glucose, Bld 115 (H) 70 - 99 mg/dL    Comment: Glucose reference range applies only to samples taken after fasting for at least 8 hours.   BUN 14 8 - 23 mg/dL   Creatinine, Ser 0.86 0.44 - 1.00 mg/dL   Calcium 8.4 (L) 8.9 - 10.3 mg/dL   GFR, Estimated >60 >60 mL/min    Comment: (NOTE) Calculated using the CKD-EPI Creatinine Equation (2021)    Anion gap 7 5 - 15    Comment: Performed at [facility] [address] [place], [place] [address]  Troponin I (High Sensitivity)     Status: None   Collection Time: 02/11/21  7:19 AM  Result Value Ref Range   Troponin I (High Sensitivity) 12 <18 ng/L    Comment: (NOTE) Elevated high sensitivity troponin I (hsTnI) values and significant  changes across serial measurements may suggest ACS but many other  chronic and acute conditions are known to elevate hsTnI results.  Refer to the "Links" section for chest pain algorithms and additional  guidance. Performed at [facility], [place] [address] [place], [place] [address]   Magnesium     Status: Abnormal   Collection Time: 02/11/21  7:19 AM  Result Value Ref Range   Magnesium 1.3 (L) 1.7 - 2.4 mg/dL     Comment: Performed at [facility] [address] [place], [place] [address]  CBC with Differential     Status: Abnormal   Collection Time: 02/11/21  7:24 AM  Result Value Ref Range   WBC 6.1 4.0 - 10.5 K/uL   RBC 3.18 (L) 3.87 - 5.11 MIL/uL   Hemoglobin 10.6 (L) 12.0 - 15.0 g/dL   HCT 31.2 (L) 36.0 - 46.0 %   MCV 98.1 80.0 - 100.0 fL   MCH 33.3 26.0 - 34.0 pg   MCHC 34.0 30.0 - 36.0 g/dL   RDW 23.8 (H) 11.5 - 15.5 %   Platelets 53 (L) 150 - 400 K/uL    Comment: Immature Platelet Fraction may be clinically indicated, consider ordering this additional test [id number] REPEATED TO VERIFY PLATELET COUNT CONFIRMED BY SMEAR    nRBC 0.8 (H) 0.0 - 0.2 %   Neutrophils Relative % 87 %   Neutro Abs 5.3 1.7 - 7.7 K/uL   Lymphocytes Relative 4 %   Lymphs Abs 0.2 (L) 0.7 - 4.0 K/uL   Monocytes Relative 7 %   Monocytes Absolute 0.5 0.1 - 1.0 K/uL   Eosinophils Relative 0 %   Eosinophils Absolute 0.0 0.0 - 0.5  K/uL   Basophils Relative 0 %   Basophils Absolute 0.0 0.0 - 0.1 K/uL   Immature Granulocytes 2 %   Abs Immature Granulocytes 0.11 (H) 0.00 - 0.07 K/uL   Ovalocytes PRESENT     Comment: Performed at [facility], [place] [address], [place], [place] [address]  Brain natriuretic peptide     Status: Abnormal   Collection Time: 02/11/21  7:24 AM  Result Value Ref Range   B Natriuretic Peptide 420.9 (H) 0.0 - 100.0 pg/mL    Comment: Performed at [facility] [address], [place], [place] [address]  Resp Panel by RT-PCR (Flu A&B, Covid) Nasopharyngeal Swab     Status: None   Collection Time: 02/11/21  7:30 AM   Specimen: Nasopharyngeal Swab; Nasopharyngeal(NP) swabs in vial transport medium  Result Value Ref Range   SARS Coronavirus 2 by RT PCR NEGATIVE NEGATIVE    Comment: (NOTE) SARS-CoV-2 target nucleic acids are NOT DETECTED.  The SARS-CoV-2 RNA is generally detectable in upper respiratory specimens during the acute phase of infection. The  lowest concentration of SARS-CoV-2 viral copies this assay can detect is 138 copies/mL. A negative result does not preclude SARS-Cov-2 infection and should not be used as the sole basis for treatment or other patient management decisions. A negative result may occur with  improper specimen collection/handling, submission of specimen other than nasopharyngeal swab, presence of viral mutation(s) within the areas targeted by this assay, and inadequate number of viral copies(<138 copies/mL). A negative result must be combined with clinical observations, patient history, and epidemiological information. The expected result is Negative.  Fact Sheet for Patients:  EntrepreneurPulse.com.au  Fact Sheet for Healthcare Providers:  IncredibleEmployment.be  This test is no t yet approved or cleared by the [place] FDA and  has been authorized for detection and/or diagnosis of SARS-CoV-2 by FDA under an Emergency Use Authorization (EUA). This EUA will remain  in effect (meaning this test can be used) for the duration of the COVID-19 declaration under Section 564(b)(1) of the Act, 21 U.S.C.section 360bbb-3(b)(1), unless the authorization is terminated  or revoked sooner.       Influenza A by PCR NEGATIVE NEGATIVE   Influenza B by PCR NEGATIVE NEGATIVE    Comment: (NOTE) The Xpert Xpress SARS-CoV-2/FLU/RSV plus assay is intended as an aid in the diagnosis of influenza from Nasopharyngeal swab specimens and should not be used as a sole basis for treatment. Nasal washings and aspirates are unacceptable for Xpert Xpress SARS-CoV-2/FLU/RSV testing.  Fact Sheet for Patients: EntrepreneurPulse.com.au  Fact Sheet for Healthcare Providers: IncredibleEmployment.be  This test is not yet approved or cleared by the [place] FDA and has been authorized for detection and/or diagnosis of SARS-CoV-2 by FDA under an Emergency  Use Authorization (EUA). This EUA will remain in effect (meaning this test can be used) for the duration of the COVID-19 declaration under Section 564(b)(1) of the Act, 21 U.S.C. section 360bbb-3(b)(1), unless the authorization is terminated or revoked.  Performed at [facility], [place] [address], [place], [place] [address]   Troponin I (High Sensitivity)     Status: None   Collection Time: 02/11/21  9:25 AM  Result Value Ref Range   Troponin I (High Sensitivity) 10 <18 ng/L    Comment: (NOTE) Elevated high sensitivity troponin I (hsTnI) values and significant  changes across serial measurements may suggest ACS but many other  chronic and acute conditions are known to elevate hsTnI results.  Refer  to the "Links" section for chest pain algorithms and additional  guidance. Performed at [facility], [place] [address], [place], [place] [address]     ECG   Atrial tachycardia versus atypical atrial flutter at 142- Personally Reviewed  Telemetry   Regular narrow complex tachycardia, possibly atrial flutter or atrial tachycardia with rates around 150- Personally Reviewed  Radiology   CT Angio Chest PE W and/or Wo Contrast  Result Date: 02/11/2021 CLINICAL DATA:  71 year old female with lung cancer. Shortness of breath. Blood transfusion yesterday. Atrial fibrillation with RVR. EXAM: CT ANGIOGRAPHY CHEST WITH CONTRAST TECHNIQUE: Multidetector CT imaging of the chest was performed using the standard protocol during bolus administration of intravenous contrast. Multiplanar CT image reconstructions and MIPs were obtained to evaluate the vascular anatomy. RADIATION DOSE REDUCTION: This exam was performed according to the departmental dose-optimization program which includes automated exposure control, adjustment of the mA and/or kV according to patient size and/or use of iterative reconstruction technique. CONTRAST:  13mL OMNIPAQUE IOHEXOL 350 MG/ML SOLN COMPARISON:  Chest CT without  contrast 12/27/2020. PET-CT 11/04/2020 FINDINGS: Cardiovascular: Good contrast bolus timing in the pulmonary arterial tree. Mild respiratory motion. No focal filling defect identified in the pulmonary arteries to suggest acute pulmonary embolism. Stable cardiac size, within normal limits. No pericardial effusion. Little aortic contrast. Aortic and coronary artery calcified atherosclerosis. Mediastinum/Nodes: No discrete mediastinal mass or lymphadenopathy. Lungs/Pleura: Moderate bilateral layering pleural effusions have mildly progressed since December. Cannot exclude complex fluid density, exudate. Compressive, enhancing atelectasis in both lungs. Superimposed atelectatic changes to the central airways which remain patent. Underlying centrilobular emphysema most apparent in the upper lobes. New compared to December curvilinear and masslike opacity in the lingula, but seems to be enhancing similar to the other atelectatic lung (series 8, image 68. Bronchopneumonia felt less likely. Primary left infrahilar tumor not well evaluated due to presence of effusions. Upper Abdomen: Visible liver, gallbladder, spleen, pancreas, adrenal glands and kidneys appear stable since December. Negative visible bowel. No free air or free fluid in the visible upper abdomen. Musculoskeletal: Previously augmented upper lumbar compression fracture. Osteopenia. No acute or suspicious osseous lesion identified. Review of the MIP images confirms the above findings. IMPRESSION: 1. No evidence of acute pulmonary embolus. 2. Progressed since December and now moderate bilateral layering pleural effusions, cannot exclude exudative fluid. Associated compressive atelectasis, and probably new round atelectasis in the lingula, with Bronchopneumonia felt less likely. Known primary left lower lobe tumor not well evaluated due to presence of effusions. 3. Aortic Atherosclerosis (ICD10-I70.0) and Emphysema (ICD10-J43.9). Electronically Signed   By: Genevie Ann M.D.   On: 02/11/2021 09:46   DG Chest Portable 1 View  Result Date: 02/11/2021 CLINICAL DATA:  71 year old female with shortness of breath. Lung cancer. EXAM: PORTABLE CHEST 1 VIEW COMPARISON:  Chest CT 12/27/2020 and earlier. FINDINGS: Portable AP semi upright view at 0738 hours. Continued veiling opacity at both lung bases greater on the right. Bilateral pleural effusions and atelectasis demonstrated in December. Underlying emphysema. Ventilation has not significantly changed. Mediastinal contours remain normal. Visualized tracheal air column is within normal limits. No pneumothorax. No acute osseous abnormality identified. Paucity of bowel gas in the upper abdomen. IMPRESSION: Continued left greater than right pleural effusions and associated lung base opacity which most resembled atelectasis on December CT. Underlying Emphysema (ICD10-J43.9). No new cardiopulmonary abnormality. Electronically Signed   By: Genevie Ann M.D.   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  She was placed on diltiazem and metoprolol for rate control she was not on anticoagulation.  She was last seen in the office on January 17 and noted to be on anticoagulation and there was a plan for possible rhythm control strategy in about 6 weeks since she was thought to be in A. fib.  She is now in a fast atrial tachycardia.  I agree with IV  diltiazem. Will need to continue home metoprolol (give the first dose now) in addition to this -ultimately may need antiarrhythmic therapy. Acute on chronic heart failure Has a history of diastolic heart failure and noted to have bilateral pleural effusions on x-ray with worsening edema.  This could represent acute systolic congestive heart failure.  We will repeat echo possibly tomorrow if her rates are improved and agree with increased diuresis at Lasix 40 mg twice daily.  Thank for the consultation.  Cardiology will follow with you.  Time Spent Directly with Patient:  I have spent a total of 65 minutes with the patient reviewing hospital notes, telemetry, EKGs, labs and examining the patient as well as establishing an assessment and plan that was discussed personally with the patient.  > 50% of time was spent in direct patient care.  Length of Stay:  LOS: 0 days   Pixie Casino, MD, [facility], [place] Director of the Advanced Lipid Disorders &  Cardiovascular Risk Reduction Clinic Diplomate of the American Board of Clinical Lipidology Attending Cardiologist  Direct Dial: [phone]   Fax: [phone]  Website:  [email or web address] 02/11/2021, 12:25 PM

## 2021-02-11 NOTE — ED Provider Notes (Signed)
.  Critical Care Performed by: Wyvonnia Dusky, MD Authorized by: Wyvonnia Dusky, MD   Critical care provider statement:    Critical care time (minutes):  45   Critical care time was exclusive of:  Separately billable procedures and treating other patients   Critical care was necessary to treat or prevent imminent or life-threatening deterioration of the following conditions:  Respiratory failure   Critical care was time spent personally by me on the following activities:  Ordering and performing treatments and interventions, ordering and review of laboratory studies, ordering and review of radiographic studies, pulse oximetry, review of old charts, examination of patient and evaluation of patient's response to treatment   Care discussed with: admitting provider   Comments:     Bipap for respiratory failure - diuresis for CHF exacerbation  Clinical Course as of 02/11/21 1028  Sat Feb 11, 2021  0739 71 yo female w/ hx of A Fib on metoprolol 75 mg BID and eliquis, stage 2 lung cancer on chemo, anemia s/p 2 unit transfusion yesterday, presenting to ED with dyspnea and SOB.  She wears 2 L [place] at baseline.  Reports sudden worsening of SOB overnight, woke up SOB around 0400.   Denies CP or pressure.  Denies recent fevers, infectious symptoms.  On exam tachycardic, HR 140's, BP stable, tachypneic RR 30's, sating 94% on baseline 2L [place].  Bilateral rales, with B lines noted in lower and mid lung fields on bedside U/S, and symmetrical LE edema consistent with CHF.  Bipap ordered for work of breathing.  We'll check for anemia, check BNP, check Cr level.  40 mg IV lasix ordered (she takes 20 mg daily).  She has not had her morning meds including metoprolol - 5 mg IV ordered her for suspected A Fib.  Differential remains broad for CHF exacerbation which may be secondary to A Fib with RVR vs infection vs PE vs other. [MT]  3491 ECG per my interpretation with tachycardia, unclear if sinus vs atrial dysrhythmia  [MT]  0752 Hgb 10.6 - appropriately elevated after transfusions yesterday [MT]  0809 Pt's husband present at bedside now.  On reassessment she is breathing much more comfortably on the bipap and feels better.  BMP returned - Cr 0.86.  We'll move to CT PE at this time to evaluate for PE vs PNA.  Xray reviewed - possible opacity vs effusion R>L.  Trop 12.  Doubt ACS at this time. [MT]  [id number] Platelet 53 - consistent with chronic thrombocytopenia [MT]  0916 B Natriuretic Peptide(!): 420.9 [MT]  1014 Delta trop stable  [MT]  1015 RT called - will trial off bipap, wean back to 2L if possible [MT]  1027 Diltiazem ordered for rate control for suspected A-fib with RVR.  Not entirely clear from the CT scan whether there may be an underlying infectious process beneath these pleural effusions, but felt to be reasonable to initiate the patient on some antibiotics with Rocephin and doxycycline for now.  I do not see evidence of sepsis otherwise to warrant blood cultures.  She has been weaned off of the BiPAP and will be admitted to the hospitalist. [MT]    Clinical Course User Index [MT] Hellena Pridgen, Carola Rhine, MD      Wyvonnia Dusky, MD 02/11/21 1028

## 2021-02-11 NOTE — Assessment & Plan Note (Addendum)
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 Julien Nordmann of oncology - completed chemotherapy treatments of cisplatin and etoposide approximately 2 weeks ago.,  Also completed lung radiation, plans for prophylactic whole brain radiation in the near future.   Outpatient oncology follow up.

## 2021-02-11 NOTE — Assessment & Plan Note (Addendum)
Continue with statin therapy.  ?

## 2021-02-11 NOTE — Assessment & Plan Note (Deleted)
A. fib with RVR  -patient was seen to be in A. fib with RVR with heart rates into the 140s.  She notes that her heart rates have been elevated into the 140s since she has elevated since she had been started on chemotherapy.  Home regimen included Diltiazem 360 mg daily and metoprolol 75 mg every 8 hours, but had recently gotten switched to twice daily due to episodes of hypotension.  Patient was given metoprolol 5 mg IV, diltiazem 15 mg IV, and started on Cardizem drip.   -Continue Cardizem drip  -Appreciate cardiology consultative services, will defer to them for medication regimen adjustment

## 2021-02-11 NOTE — ED Triage Notes (Signed)
Pt arrived via [facility] from home for c/o [id number]. Pt had a blood transfusion yesterday. Pt wears 2L 02 per [place] at baseline. Pt is A-fib w/RVR on monitor. Pt is tachypneic.

## 2021-02-11 NOTE — ED Provider Notes (Signed)
[facility] EMERGENCY DEPARTMENT Provider Note   CSN: [id number] Arrival date & time: 02/11/21  0700     History  Chief Complaint  Patient presents with   Shortness of Breath    Patricia [name] is a 71 y.o. female.  Patient with history of A. 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 She states that same has been worsening over the past 2 months with acute worsening at 4am this morning. She wears 2L [place] at baseline, however EMS states that this is through about 40 feet of tubing throughout her whole house. She has been attempting to manage her shortness of breath at home with her home nebulizer machine with minimal success. She also states that her legs have been swelling more recently, she is on Lasix 20mg  once a day for this. Also endorses new productive cough which is thick in nature. She is actively receiving chemotherapy for her lung cancer and has had to receive several blood transfusions due to anemia related to this with the last one being on 2/1 when she received 2 units. Of note, patient is on Elliquis diltizem, and metoprolol for afib, denies any missed doses of this.   The history is provided by the patient. No language interpreter was used.  Shortness of Breath Associated symptoms: cough and wheezing   Associated symptoms: no chest pain, no fever and no vomiting       Home Medications Prior to Admission medications   Medication Sig Start Date End Date Taking? Authorizing Provider  carboxymethylcellulose (REFRESH PLUS) 0.5 % SOLN Place 1 drop into both eyes 3 (three) times daily as needed (for dryness).     [provider]  carisoprodol (SOMA) 350 MG tablet Take 350 mg by mouth 3 (three) times daily.  09/26/17   [provider]  Cholecalciferol (VITAMIN D3) 50 MCG (2000 UT) TABS Take 2,000 Units by mouth daily.    [provider]  clobetasol ointment (TEMOVATE) 5.36  % Apply 1 application topically See admin instructions. Apply to vaginal area daily as directed    [provider]  clonazePAM (KLONOPIN) 1 MG tablet Take 1 mg by mouth at bedtime.  10/15/17   [provider]  diltiazem (CARDIZEM CD) 360 MG 24 hr capsule Take 1 capsule (360 mg total) by mouth daily. 01/06/21 04/06/21  Baldwin Jamaica, PA-C  docusate sodium (COLACE) 100 MG capsule Take 1 capsule (100 mg total) by mouth 2 (two) times daily. Patient taking differently: Take 100 mg by mouth as needed for mild constipation or moderate constipation. 01/03/21   Pokhrel, Laxman, MD  ELIQUIS 5 MG TABS tablet TAKE 1 TABLET(5 MG) BY MOUTH TWICE DAILY 10/03/20   Camnitz, Ocie Doyne, MD  escitalopram (LEXAPRO) 10 MG tablet Take 1 tablet (10 mg total) by mouth daily. 01/04/21   Pokhrel, Corrie Mckusick, MD  fluticasone (FLONASE) 50 MCG/ACT nasal spray Place 2 sprays into both nostrils daily as needed for allergies or rhinitis.  10/11/17   [provider]  furosemide (LASIX) 20 MG tablet Take 1 tablet (20 mg total) by mouth daily. 10/20/20 01/18/21  Terrilee Croak, MD  gabapentin (NEURONTIN) 600 MG tablet Take 600 mg by mouth See admin instructions. Take 600 mg by mouth in the morning and at lunchtime 09/16/17   [provider]  guaiFENesin-dextromethorphan (ROBITUSSIN DM) 100-10 MG/5ML syrup Take 10 mLs by mouth every 4 (four) hours as needed for cough. 01/03/21  Pokhrel, Laxman, MD  HYDROcodone-acetaminophen (NORCO) 7.5-325 MG tablet Take 1 tablet by mouth every 6 (six) hours as needed for severe pain. Patient not taking: Reported on 01/24/2021 12/18/20   Sherwood Gambler, MD  ipratropium (ATROVENT) 0.02 % nebulizer solution Take by nebulization 3 (three) times daily. As directed 01/05/21   [provider]  levalbuterol Penne Lash) 1.25 MG/3ML nebulizer solution Inhale into the lungs as directed. 01/10/21   [provider]  lidocaine (XYLOCAINE) 2 % solution Use as directed 15  mLs in the mouth or throat every 6 (six) hours as needed for mouth pain. Patient not taking: Reported on 02/08/2021 12/18/20   Sherwood Gambler, MD  linaclotide [facility]) 145 MCG CAPS capsule Take 1 capsule (145 mcg total) by mouth daily at 4 PM. Patient not taking: Reported on 02/08/2021 10/03/20   Debbe Odea, MD  magic mouthwash SOLN Take 5 mLs by mouth 4 (four) times daily as needed for mouth pain. Pt is allergic to Magnesium Patient not taking: Reported on 12/25/2020 11/04/20   Sherol Dade E, PA-C  magnesium oxide (MAG-OX) 400 (240 Mg) MG tablet Take 1 tablet (400 mg total) by mouth daily. 11/08/20   Heilingoetter, Cassandra L, PA-C  Metoprolol Tartrate 75 MG TABS Take 75 mg by mouth every 8 (eight) hours. 01/06/21 02/05/21  Baldwin Jamaica, PA-C  nystatin (MYCOSTATIN) 100000 UNIT/ML suspension Take 5 mLs (500,000 Units total) by mouth 4 (four) times daily. Patient not taking: Reported on 02/08/2021 12/16/20   Tyler Pita, MD  omeprazole (PRILOSEC) 20 MG capsule Take 20 mg by mouth daily before breakfast.  08/27/17   [provider]  ondansetron (ZOFRAN-ODT) 8 MG disintegrating tablet Take 8 mg by mouth daily as needed for nausea. Patient not taking: Reported on 02/08/2021 10/13/20   [provider]  polyethylene glycol (MIRALAX / GLYCOLAX) packet Take 34 g by mouth in the morning.  Patient not taking: Reported on 02/08/2021    [provider]  prochlorperazine (COMPAZINE) 10 MG tablet Take 1 tablet (10 mg total) by mouth every 6 (six) hours as needed for nausea or vomiting. 10/27/20   Curt Bears, MD  rosuvastatin (CRESTOR) 5 MG tablet Take 5 mg by mouth 3 (three) times a week. Mondays Wednesdays Fridays    [provider]  sodium chloride 1 g tablet Take 1 g by mouth 3 (three) times daily. 11/26/20   [provider]  traMADol (ULTRAM) 50 MG tablet Take 100 mg by mouth in the morning, at noon, and at bedtime. 10/08/19   [provider]  zaleplon (SONATA) 10 MG capsule Take 10 mg by mouth at bedtime as needed (for interrupted sleep).     [provider]      Allergies    Augmentin [amoxicillin-pot clavulanate], Levaquin [levofloxacin in d5w], Magnesium hydroxide, Oxycodone, and Other    Review of Systems   Review of Systems  Constitutional:  Negative for chills and fever.  Respiratory:  Positive for cough, shortness of breath and wheezing. Negative for choking and stridor.   Cardiovascular:  Negative for chest pain.  Gastrointestinal:  Negative for diarrhea, nausea and vomiting.  All other systems reviewed and are negative.  Physical Exam Updated Vital Signs BP (!) 130/30    Pulse (!) 143    Temp 98.1 F (36.7 C) (Oral)    Resp (!) 33    Ht 5\' 6"  (1.676 m)    Wt 84.6 kg    LMP  (LMP Unknown)    SpO2 93%  BMI 30.10 kg/m  Physical Exam Vitals and nursing note reviewed.  Constitutional:      Comments: Patient chronically ill appearing sitting upright in bed in some distress  HENT:     Head: Normocephalic and atraumatic.  Eyes:     Extraocular Movements: Extraocular movements intact.     Pupils: Pupils are equal, round, and reactive to light.  Cardiovascular:     Rate and Rhythm: Tachycardia present. Rhythm irregular.  Pulmonary:     Effort: Tachypnea present.     Breath sounds: Wheezing present.  Abdominal:     Palpations: Abdomen is soft.  Musculoskeletal:     Cervical back: Normal range of motion.     Comments: 2+ pitting edema in bilateral lower extremities without erythema, tenderness, or warmth  Skin:    General: Skin is warm and dry.  Neurological:     General: No focal deficit present.    ED Results / Procedures / Treatments   Labs (all labs ordered are listed, but only abnormal results are displayed) Labs Reviewed  BASIC METABOLIC PANEL - Abnormal; Notable for the following components:      Result Value   Glucose, Bld 115 (*)    Calcium 8.4 (*)    All other components within  normal limits  CBC WITH DIFFERENTIAL/PLATELET - Abnormal; Notable for the following components:   RBC 3.18 (*)    Hemoglobin 10.6 (*)    HCT 31.2 (*)    RDW 23.8 (*)    Platelets 53 (*)    nRBC 0.8 (*)    Lymphs Abs 0.2 (*)    Abs Immature Granulocytes 0.11 (*)    All other components within normal limits  BRAIN NATRIURETIC PEPTIDE - Abnormal; Notable for the following components:   B Natriuretic Peptide 420.9 (*)    All other components within normal limits  MAGNESIUM - Abnormal; Notable for the following components:   Magnesium 1.3 (*)    All other components within normal limits  RESP PANEL BY RT-PCR (FLU A&B, COVID) [id number]  TROPONIN I (HIGH SENSITIVITY)  TROPONIN I (HIGH SENSITIVITY)    EKG EKG Interpretation  Date/Time:  Saturday February 11 2021 07:12:41 EST Ventricular Rate:  142 PR Interval:  105 QRS Duration: 80 QT Interval:  307 QTC Calculation: 472 R Axis:   90 Text Interpretation: Sinus or ectopic atrial tachycardia Borderline right axis deviation Low voltage, precordial leads Repolarization abnormality, prob rate related Confirmed by Octaviano Glow [phone]) on 02/11/2021 7:53:56 AM  Radiology CT Angio Chest PE W and/or Wo Contrast  Result Date: 02/11/2021 CLINICAL DATA:  71 year old female with lung cancer. Shortness of breath. Blood transfusion yesterday. Atrial fibrillation with RVR. EXAM: CT ANGIOGRAPHY CHEST WITH CONTRAST TECHNIQUE: Multidetector CT imaging of the chest was performed using the standard protocol during bolus administration of intravenous contrast. Multiplanar CT image reconstructions and MIPs were obtained to evaluate the vascular anatomy. RADIATION DOSE REDUCTION: This exam was performed according to the departmental dose-optimization program which includes automated exposure control, adjustment of the mA and/or kV according to patient size and/or use of iterative reconstruction technique. CONTRAST:  162mL OMNIPAQUE IOHEXOL 350 MG/ML SOLN  COMPARISON:  Chest CT without contrast 12/27/2020. PET-CT 11/04/2020 FINDINGS: Cardiovascular: Good contrast bolus timing in the pulmonary arterial tree. Mild respiratory motion. No focal filling defect identified in the pulmonary arteries to suggest acute pulmonary embolism. Stable cardiac size, within normal limits. No pericardial effusion. Little aortic contrast. Aortic and coronary artery calcified atherosclerosis. Mediastinum/Nodes: No discrete mediastinal mass or  lymphadenopathy. Lungs/Pleura: Moderate bilateral layering pleural effusions have mildly progressed since December. Cannot exclude complex fluid density, exudate. Compressive, enhancing atelectasis in both lungs. Superimposed atelectatic changes to the central airways which remain patent. Underlying centrilobular emphysema most apparent in the upper lobes. New compared to December curvilinear and masslike opacity in the lingula, but seems to be enhancing similar to the other atelectatic lung (series 8, image 68. Bronchopneumonia felt less likely. Primary left infrahilar tumor not well evaluated due to presence of effusions. Upper Abdomen: Visible liver, gallbladder, spleen, pancreas, adrenal glands and kidneys appear stable since December. Negative visible bowel. No free air or free fluid in the visible upper abdomen. Musculoskeletal: Previously augmented upper lumbar compression fracture. Osteopenia. No acute or suspicious osseous lesion identified. Review of the MIP images confirms the above findings. IMPRESSION: 1. No evidence of acute pulmonary embolus. 2. Progressed since December and now moderate bilateral layering pleural effusions, cannot exclude exudative fluid. Associated compressive atelectasis, and probably new round atelectasis in the lingula, with Bronchopneumonia felt less likely. Known primary left lower lobe tumor not well evaluated due to presence of effusions. 3. Aortic Atherosclerosis (ICD10-I70.0) and Emphysema (ICD10-J43.9).  Electronically Signed   By: Genevie Ann M.D.   On: 02/11/2021 09:46   DG Chest Portable 1 View  Result Date: 02/11/2021 CLINICAL DATA:  71 year old female with shortness of breath. Lung cancer. EXAM: PORTABLE CHEST 1 VIEW COMPARISON:  Chest CT 12/27/2020 and earlier. FINDINGS: Portable AP semi upright view at 0738 hours. Continued veiling opacity at both lung bases greater on the right. Bilateral pleural effusions and atelectasis demonstrated in December. Underlying emphysema. Ventilation has not significantly changed. Mediastinal contours remain normal. Visualized tracheal air column is within normal limits. No pneumothorax. No acute osseous abnormality identified. Paucity of bowel gas in the upper abdomen. IMPRESSION: Continued left greater than right pleural effusions and associated lung base opacity which most resembled atelectasis on December CT. Underlying Emphysema (ICD10-J43.9). No new cardiopulmonary abnormality. Electronically Signed   By: Genevie Ann M.D.   On: 02/11/2021 08:04    Procedures .Critical Care Performed by: Bud Face, PA-C Authorized by: Bud Face, PA-C   Critical care provider statement:    Critical care time (minutes):  35   Critical care start time:  02/11/2021 8:00 AM   Critical care end time:  02/11/2021 8:35 AM   Critical care time was exclusive of:  Separately billable procedures and treating other patients   Critical care was necessary to treat or prevent imminent or life-threatening deterioration of the following conditions:  Respiratory failure   Critical care was time spent personally by me on the following activities:  Development of treatment plan with patient or surrogate, evaluation of patient's response to treatment, examination of patient, obtaining history from patient or surrogate, ordering and review of radiographic studies, ordering and review of laboratory studies, pulse oximetry, re-evaluation of patient's condition and review of old charts   I assumed  direction of critical care for this patient from another provider in my specialty: no     Care discussed with: admitting provider      Medications Ordered in ED Medications - No data to display  ED Course/ Medical Decision Making/ A&P Clinical Course as of 02/11/21 1034  Sat Feb 11, 2021  0739 71 yo female w/ hx of A Fib on metoprolol 75 mg BID and eliquis, stage 2 lung cancer on chemo, anemia s/p 2 unit transfusion yesterday, presenting to ED with dyspnea and SOB.  She  wears 2 L [place] at baseline.  Reports sudden worsening of SOB overnight, woke up SOB around 0400.   Denies CP or pressure.  Denies recent fevers, infectious symptoms.  On exam tachycardic, HR 140's, BP stable, tachypneic RR 30's, sating 94% on baseline 2L [place].  Bilateral rales, with B lines noted in lower and mid lung fields on bedside U/S, and symmetrical LE edema consistent with CHF.  Bipap ordered for work of breathing.  We'll check for anemia, check BNP, check Cr level.  40 mg IV lasix ordered (she takes 20 mg daily).  She has not had her morning meds including metoprolol - 5 mg IV ordered her for suspected A Fib.  Differential remains broad for CHF exacerbation which may be secondary to A Fib with RVR vs infection vs PE vs other. [MT]  2505 ECG per my interpretation with tachycardia, unclear if sinus vs atrial dysrhythmia [MT]  0752 Hgb 10.6 - appropriately elevated after transfusions yesterday [MT]  0809 Pt's husband present at bedside now.  On reassessment she is breathing much more comfortably on the bipap and feels better.  BMP returned - Cr 0.86.  We'll move to CT PE at this time to evaluate for PE vs PNA.  Xray reviewed - possible opacity vs effusion R>L.  Trop 12.  Doubt ACS at this time. [MT]  [id number] Platelet 53 - consistent with chronic thrombocytopenia [MT]  0916 B Natriuretic Peptide(!): 420.9 [MT]  1014 Delta trop stable  [MT]  1015 RT called - will trial off bipap, wean back to 2L if possible [MT]  1027 Diltiazem  ordered for rate control for suspected A-fib with RVR.  Not entirely clear from the CT scan whether there may be an underlying infectious process beneath these pleural effusions, but felt to be reasonable to initiate the patient on some antibiotics with Rocephin and doxycycline for now.  I do not see evidence of sepsis otherwise to warrant blood cultures.  She has been weaned off of the BiPAP and will be admitted to the hospitalist. [MT]    Clinical Course User Index [MT] Trifan, Carola Rhine, MD                           Medical Decision Making Amount and/or Complexity of Data Reviewed Labs: ordered. Radiology: ordered.   This patient presents to the ED for concern of shortness of breath, this involves an extensive number of treatment options, and is a complaint that carries with it a high risk of complications and morbidity.  The differential diagnosis includes CHF exacerbation, PE, pneumonia, pneumothorax, afib RVR, ACS   Co morbidities that complicate the patient evaluation  AFIB, small cell lung cancer   Additional history obtained:  Additional history obtained from chart review   Lab Tests:  I Ordered, and personally interpreted labs.  The pertinent results include:  no electrolyte abnormalities, troponin negative, magnesium 1.3, no leukocytosis, anemia at 10.6, up from 7.4 3 days ago when patient was transfused. BNP 420.9. COVID and flu negative   Imaging Studies ordered:  I ordered imaging studies including CXR and CTPE I independently visualized and interpreted imaging which showed CXR  Continued left greater than right pleural effusions and associated lung base opacity which most resembled atelectasis on December CT. Underlying Emphysema. No new cardiopulmonary abnormality. CTPE 1. No evidence of acute pulmonary embolus. 2. Progressed since December and now moderate bilateral layering pleural effusions, cannot exclude exudative fluid. Associated compressive atelectasis,  and probably new round atelectasis in the lingula, with Bronchopneumonia felt less likely. Known primary left lower lobe tumor not well evaluated due to presence of effusions. I agree with the radiologist interpretation   Cardiac Monitoring:  The patient was maintained on a cardiac monitor.  I personally viewed and interpreted the cardiac monitored which showed an underlying rhythm of: sinus or ectopic atrial tachycardia   Medicines ordered and prescription drug management:  I ordered medication including magnesium for hypomagnesemia, metoprolol and diltiazem for rate and rhythm control, Lasix with some fluid overload concerning for new onset CHF, and Rocephin and doxycycline for potential pneumonia. Also ordered bipap for shortness of breath with fluid overload  Reevaluation of the patient after these medicines showed that the patient improved I have reviewed the patients home medicines and have made adjustments as needed   Critical Interventions:  Patient placed on bipap for worsening shortness of breath. Given Diltizem and Metoprolol for rhythm and rate control of afib RVR   Reevaluation:  After the interventions noted above, I reevaluated the patient and found that they have :improved   Dispostion:  After consideration of the diagnostic results and the patients response to treatment, I feel that the patent would benefit from inpatient admission for new worsening shortness of breath and potentially new CHF.   Patient presented today with new worsening shortness of breath.  She is on 2 L nasal cannula at baseline, however presented today with tachypnea and tachycardia with rates in the 150s and hypoxia at 85%.  She did show signs of fluid overload on exam both with chest ultrasound and bilateral lower extremity edema.  Therefore, the decision was made to start her on BiPAP and give Lasix.  Her last echo was in 9/22 which revealed no structural heart abnormalities and an EF of 60 to  65%.  CT scan of her chest revealed new worsening bilateral pleural effusions which could be due to her history of small cell lung cancer, however could also be attributed to new onset CHF versus pneumonia.  Gave Rocephin and doxycycline for management of possible pneumonia.  Upon reevaluation, patient much improved after bipap. Heart rate remains in the 142s. She will require admission for continued heart rate/rhythm management as well as likely new echo and thoracentesis for evaluation of patients pleural effusions. Patient is understanding and amenable with plan.  Discussed patient with hospitalist who agrees to admit.   This is a shared visit with supervising physician Dr. Langston Masker who has independently evaluated patient & provided guidance in evaluation/management/disposition, in agreement with care    Final Clinical Impression(s) / ED Diagnoses Final diagnoses:  Atrial fibrillation with RVR ([place])  Acute on chronic congestive heart failure, unspecified heart failure type [facility])  Community acquired pneumonia, unspecified laterality    Rx / DC Orders ED Discharge Orders     None         Nestor Lewandowsky 02/11/21 1054    Wyvonnia Dusky, MD 02/11/21 1416

## 2021-02-11 NOTE — Assessment & Plan Note (Addendum)
Oral thrush continue with nystatin.   Plan to follow up with oncology as outpatient, patient recently completed chemotherapy for lung cancer.

## 2021-02-12 ENCOUNTER — Other Ambulatory Visit (HOSPITAL_COMMUNITY): Payer: Medicare HMO

## 2021-02-12 ENCOUNTER — Inpatient Hospital Stay (HOSPITAL_COMMUNITY): Payer: Medicare HMO

## 2021-02-12 DIAGNOSIS — E876 Hypokalemia: Secondary | ICD-10-CM | POA: Diagnosis present

## 2021-02-12 DIAGNOSIS — I480 Paroxysmal atrial fibrillation: Secondary | ICD-10-CM | POA: Diagnosis not present

## 2021-02-12 DIAGNOSIS — J90 Pleural effusion, not elsewhere classified: Secondary | ICD-10-CM | POA: Diagnosis not present

## 2021-02-12 DIAGNOSIS — J9621 Acute and chronic respiratory failure with hypoxia: Secondary | ICD-10-CM | POA: Diagnosis not present

## 2021-02-12 LAB — PROCALCITONIN: Procalcitonin: <0.1 ng/mL

## 2021-02-12 LAB — CBC
HCT: 31.4 % — ABNORMAL LOW (ref 36.0–46.0)
Hemoglobin: 10.9 g/dL — ABNORMAL LOW (ref 12.0–15.0)
MCH: 33.5 pg (ref 26.0–34.0)
MCHC: 34.7 g/dL (ref 30.0–36.0)
MCV: 96.6 fL (ref 80.0–100.0)
Platelets: 56 10*3/uL — ABNORMAL LOW (ref 150–400)
RBC: 3.25 MIL/uL — ABNORMAL LOW (ref 3.87–5.11)
RDW: 23.2 % — ABNORMAL HIGH (ref 11.5–15.5)
WBC: 2.8 10*3/uL — ABNORMAL LOW (ref 4.0–10.5)
nRBC: 1.1 % — ABNORMAL HIGH (ref 0.0–0.2)

## 2021-02-12 LAB — BASIC METABOLIC PANEL
Anion gap: 13 (ref 5–15)
BUN: 10 mg/dL (ref 8–23)
CO2: 32 mmol/L (ref 22–32)
Calcium: 8.5 mg/dL — ABNORMAL LOW (ref 8.9–10.3)
Chloride: 91 mmol/L — ABNORMAL LOW (ref 98–111)
Creatinine, Ser: 0.74 mg/dL (ref 0.44–1.00)
GFR, Estimated: >60 mL/min (ref >60)
Glucose, Bld: 108 mg/dL — ABNORMAL HIGH (ref 70–99)
Potassium: 3.1 mmol/L — ABNORMAL LOW (ref 3.5–5.1)
Sodium: 136 mmol/L (ref 135–145)

## 2021-02-12 LAB — TYPE AND SCREEN
ABO/RH(D): O POS
Antibody Screen: NEGATIVE
Unit division: 0
Unit division: 0

## 2021-02-12 LAB — BPAM RBC
Blood Product Expiration Date: 202303062359
Blood Product Expiration Date: 202303072359
ISSUE DATE / TIME: 202302031233
ISSUE DATE / TIME: 202302031233
Unit Type and Rh: 5100
Unit Type and Rh: 5100

## 2021-02-12 LAB — MAGNESIUM: Magnesium: 1.4 mg/dL — ABNORMAL LOW (ref 1.7–2.4)

## 2021-02-12 MED ORDER — POTASSIUM CHLORIDE CRYS ER 20 MEQ PO TBCR
40.0000 meq | EXTENDED_RELEASE_TABLET | Freq: Two times a day (BID) | ORAL | Status: DC
  Administered 2021-02-12 – 2021-02-13 (×4): 40 meq via ORAL
  Filled 2021-02-12 (×4): qty 2

## 2021-02-12 MED ORDER — MAGNESIUM SULFATE 2 GM/50ML IV SOLN
2.0000 g | Freq: Once | INTRAVENOUS | Status: AC
  Administered 2021-02-12: 2 g via INTRAVENOUS
  Filled 2021-02-12: qty 50

## 2021-02-12 NOTE — Progress Notes (Signed)
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 fib, now presents with regular narrow complex tachycardia  Subjective   Remains in flutter in the 140's - started on amiodarone yesterday.  Has been on Eliquis since the end of December 2022 without missed doses.  Objective   Vitals:   02/12/21 0310 02/12/21 0517 02/12/21 0833 02/12/21 0922  BP: 117/84 117/84 105/83   Pulse: (!) 134 (!) 134 (!) 145   Resp: 17 17    Temp: 98 F (36.7 C) 98 F (36.7 C)    TempSrc: Oral Oral    SpO2: 93% 92%  94%  Weight: 84 kg     Height:        Intake/Output Summary (Last 24 hours) at 02/12/2021 1026 Last data filed at 02/12/2021 1941 Gross per 24 hour  Intake 831.79 ml  Output 2850 ml  Net -2018.21 ml   Filed Weights   02/11/21 0714 02/12/21 0310  Weight: 84.6 kg 84 kg    Physical Exam   General appearance: alert and no distress Lungs: clear to auscultation bilaterally Heart: regular rate and rhythm and tachycardic Extremities: extremities normal, atraumatic, no cyanosis or edema Neurologic: Grossly normal  Inpatient Medications    Scheduled Meds:  apixaban  5 mg Oral BID   carisoprodol  350 mg Oral TID   clonazePAM  1 mg Oral QHS   escitalopram  10 mg Oral Daily   furosemide  40 mg Intravenous BID   gabapentin  600 mg Oral BID   ipratropium  0.5 mg Nebulization TID   linaclotide  145 mcg Oral q1600   magnesium oxide  400 mg Oral Daily   metoprolol tartrate  75 mg Oral BID   pantoprazole  40 mg Oral Daily   polyethylene glycol  34 g Oral q AM   polyvinyl alcohol  1 drop Both Eyes QHS   [START ON 02/13/2021] rosuvastatin  5 mg Oral Once per day on Mon Wed Fri   sodium chloride flush  3 mL Intravenous Q12H   sodium chloride  1 g Oral TID    Continuous  Infusions:  amiodarone 30 mg/hr (02/11/21 2213)    PRN Meds: acetaminophen **OR** acetaminophen, docusate sodium, fluticasone, guaiFENesin-dextromethorphan, levalbuterol, magic mouthwash, nystatin, ondansetron **OR** ondansetron (ZOFRAN) IV, polyvinyl alcohol, traMADol, zolpidem   Labs   Results for orders placed or performed during the hospital encounter of 02/11/21 (from the past 48 hour(s))  Basic metabolic panel     Status: Abnormal   Collection Time: 02/11/21  7:19 AM  Result Value Ref Range   Sodium 137 135 - 145 mmol/L   Potassium 4.0 3.5 - 5.1 mmol/L   Chloride 99 98 - 111 mmol/L   CO2 31 22 - 32 mmol/L   Glucose, Bld 115 (H) 70 - 99 mg/dL    Comment: Glucose reference range applies only to samples taken after fasting for at least 8 hours.   BUN 14 8 - 23 mg/dL   Creatinine, Ser 0.86 0.44 - 1.00 mg/dL   Calcium 8.4 (L) 8.9 - 10.3 mg/dL   GFR, Estimated >60 >60 mL/min    Comment: (NOTE) Calculated using the CKD-EPI Creatinine Equation (2021)    Anion gap 7 5 - 15    Comment: Performed  at [facility], [place] [address], [place], [place] [address]  Troponin I (High Sensitivity)     Status: None   Collection Time: 02/11/21  7:19 AM  Result Value Ref Range   Troponin I (High Sensitivity) 12 <18 ng/L    Comment: (NOTE) Elevated high sensitivity troponin I (hsTnI) values and significant  changes across serial measurements may suggest ACS but many other  chronic and acute conditions are known to elevate hsTnI results.  Refer to the "Links" section for chest pain algorithms and additional  guidance. Performed at [facility], [place] [address] [place], [place] [address]   Magnesium     Status: Abnormal   Collection Time: 02/11/21  7:19 AM  Result Value Ref Range   Magnesium 1.3 (L) 1.7 - 2.4 mg/dL    Comment: Performed at [facility] [address] [place], [place] [address]  CBC with Differential     Status: Abnormal   Collection Time: 02/11/21  7:24 AM   Result Value Ref Range   WBC 6.1 4.0 - 10.5 K/uL   RBC 3.18 (L) 3.87 - 5.11 MIL/uL   Hemoglobin 10.6 (L) 12.0 - 15.0 g/dL   HCT 31.2 (L) 36.0 - 46.0 %   MCV 98.1 80.0 - 100.0 fL   MCH 33.3 26.0 - 34.0 pg   MCHC 34.0 30.0 - 36.0 g/dL   RDW 23.8 (H) 11.5 - 15.5 %   Platelets 53 (L) 150 - 400 K/uL    Comment: Immature Platelet Fraction may be clinically indicated, consider ordering this additional test [id number] REPEATED TO VERIFY PLATELET COUNT CONFIRMED BY SMEAR    nRBC 0.8 (H) 0.0 - 0.2 %   Neutrophils Relative % 87 %   Neutro Abs 5.3 1.7 - 7.7 K/uL   Lymphocytes Relative 4 %   Lymphs Abs 0.2 (L) 0.7 - 4.0 K/uL   Monocytes Relative 7 %   Monocytes Absolute 0.5 0.1 - 1.0 K/uL   Eosinophils Relative 0 %   Eosinophils Absolute 0.0 0.0 - 0.5 K/uL   Basophils Relative 0 %   Basophils Absolute 0.0 0.0 - 0.1 K/uL   Immature Granulocytes 2 %   Abs Immature Granulocytes 0.11 (H) 0.00 - 0.07 K/uL   Ovalocytes PRESENT     Comment: Performed at [facility], [place] [address], [place], [place] [address]  Brain natriuretic peptide     Status: Abnormal   Collection Time: 02/11/21  7:24 AM  Result Value Ref Range   B Natriuretic Peptide 420.9 (H) 0.0 - 100.0 pg/mL    Comment: Performed at [facility] [address], [place], [place] [address]  Resp Panel by RT-PCR (Flu A&B, Covid) Nasopharyngeal Swab     Status: None   Collection Time: 02/11/21  7:30 AM   Specimen: Nasopharyngeal Swab; Nasopharyngeal(NP) swabs in vial transport medium  Result Value Ref Range   SARS Coronavirus 2 by RT PCR NEGATIVE NEGATIVE    Comment: (NOTE) SARS-CoV-2 target nucleic acids are NOT DETECTED.  The SARS-CoV-2 RNA is generally detectable in upper respiratory specimens during the acute phase of infection. The lowest concentration of SARS-CoV-2 viral copies this assay can detect is 138 copies/mL. A negative result does not preclude SARS-Cov-2 infection and should not be used as the sole basis  for treatment or other patient management decisions. A negative result may occur with  improper specimen collection/handling, submission of specimen other than nasopharyngeal swab, presence of viral mutation(s) within the  areas targeted by this assay, and inadequate number of viral copies(<138 copies/mL). A negative result must be combined with clinical observations, patient history, and epidemiological information. The expected result is Negative.  Fact Sheet for Patients:  EntrepreneurPulse.com.au  Fact Sheet for Healthcare Providers:  IncredibleEmployment.be  This test is no t yet approved or cleared by the [place] FDA and  has been authorized for detection and/or diagnosis of SARS-CoV-2 by FDA under an Emergency Use Authorization (EUA). This EUA will remain  in effect (meaning this test can be used) for the duration of the COVID-19 declaration under Section 564(b)(1) of the Act, 21 U.S.C.section 360bbb-3(b)(1), unless the authorization is terminated  or revoked sooner.       Influenza A by PCR NEGATIVE NEGATIVE   Influenza B by PCR NEGATIVE NEGATIVE    Comment: (NOTE) The Xpert Xpress SARS-CoV-2/FLU/RSV plus assay is intended as an aid in the diagnosis of influenza from Nasopharyngeal swab specimens and should not be used as a sole basis for treatment. Nasal washings and aspirates are unacceptable for Xpert Xpress SARS-CoV-2/FLU/RSV testing.  Fact Sheet for Patients: EntrepreneurPulse.com.au  Fact Sheet for Healthcare Providers: IncredibleEmployment.be  This test is not yet approved or cleared by the [place] FDA and has been authorized for detection and/or diagnosis of SARS-CoV-2 by FDA under an Emergency Use Authorization (EUA). This EUA will remain in effect (meaning this test can be used) for the duration of the COVID-19 declaration under Section 564(b)(1) of the Act, 21  U.S.C. section 360bbb-3(b)(1), unless the authorization is terminated or revoked.  Performed at [facility], [place] [address] [place], [place] [address]   Troponin I (High Sensitivity)     Status: None   Collection Time: 02/11/21  9:25 AM  Result Value Ref Range   Troponin I (High Sensitivity) 10 <18 ng/L    Comment: (NOTE) Elevated high sensitivity troponin I (hsTnI) values and significant  changes across serial measurements may suggest ACS but many other  chronic and acute conditions are known to elevate hsTnI results.  Refer to the "Links" section for chest pain algorithms and additional  guidance. Performed at [facility], [place] [address] [place], [place] [address]   CBC     Status: Abnormal   Collection Time: 02/12/21  6:32 AM  Result Value Ref Range   WBC 2.8 (L) 4.0 - 10.5 K/uL   RBC 3.25 (L) 3.87 - 5.11 MIL/uL   Hemoglobin 10.9 (L) 12.0 - 15.0 g/dL   HCT 31.4 (L) 36.0 - 46.0 %   MCV 96.6 80.0 - 100.0 fL   MCH 33.5 26.0 - 34.0 pg   MCHC 34.7 30.0 - 36.0 g/dL   RDW 23.2 (H) 11.5 - 15.5 %   Platelets 56 (L) 150 - 400 K/uL    Comment: Immature Platelet Fraction may be clinically indicated, consider ordering this additional test [id number] CONSISTENT WITH PREVIOUS RESULT REPEATED TO VERIFY    nRBC 1.1 (H) 0.0 - 0.2 %    Comment: Performed at [facility], [place] [address] [place], [place] [address]  Basic metabolic panel     Status: Abnormal   Collection Time: 02/12/21  6:32 AM  Result Value Ref Range   Sodium 136 135 - 145 mmol/L   Potassium 3.1 (L) 3.5 - 5.1 mmol/L   Chloride 91 (L) 98 - 111 mmol/L   CO2 32 22 - 32 mmol/L   Glucose, Bld 108 (H) 70 - 99 mg/dL    Comment:  Glucose reference range applies only to samples taken after fasting for at least 8 hours.   BUN 10 8 - 23 mg/dL   Creatinine, Ser 0.74 0.44 - 1.00 mg/dL   Calcium 8.5 (L) 8.9 - 10.3 mg/dL   GFR, Estimated >60 >60 mL/min    Comment: (NOTE) Calculated using the CKD-EPI  Creatinine Equation (2021)    Anion gap 13 5 - 15    Comment: Performed at [facility] [address] [place], [place] [address]  Magnesium     Status: Abnormal   Collection Time: 02/12/21  6:32 AM  Result Value Ref Range   Magnesium 1.4 (L) 1.7 - 2.4 mg/dL    Comment: Performed at [facility] [address], [place], [place] [address]  Procalcitonin - Baseline     Status: None   Collection Time: 02/12/21  6:32 AM  Result Value Ref Range   Procalcitonin <0.10 ng/mL    Comment:        Interpretation: PCT (Procalcitonin) <= 0.5 ng/mL: Systemic infection (sepsis) is not likely. Local bacterial infection is possible. (NOTE)       Sepsis PCT Algorithm           Lower Respiratory Tract                                      Infection PCT Algorithm    ----------------------------     ----------------------------         PCT < 0.25 ng/mL                PCT < 0.10 ng/mL          Strongly encourage             Strongly discourage   discontinuation of antibiotics    initiation of antibiotics    ----------------------------     -----------------------------       PCT 0.25 - 0.50 ng/mL            PCT 0.10 - 0.25 ng/mL               OR       >80% decrease in PCT            Discourage initiation of                                            antibiotics      Encourage discontinuation           of antibiotics    ----------------------------     -----------------------------         PCT >= 0.50 ng/mL              PCT 0.26 - 0.50 ng/mL               AND        <80% decrease in PCT             Encourage initiation of                                             antibiotics       Encourage continuation  of antibiotics    ----------------------------     -----------------------------        PCT >= 0.50 ng/mL                  PCT > 0.50 ng/mL               AND         increase in PCT                  Strongly encourage                                      initiation of  antibiotics    Strongly encourage escalation           of antibiotics                                     -----------------------------                                           PCT <= 0.25 ng/mL                                                 OR                                        > 80% decrease in PCT                                      Discontinue / Do not initiate                                             antibiotics  Performed at [facility], [address] [place], [place] [address]     ECG   N/A  Telemetry   Atrial flutter/tachycardia in the 120-140's - Personally Reviewed  Radiology    CT Angio Chest PE W and/or Wo Contrast  Result Date: 02/11/2021 CLINICAL DATA:  71 year old female with lung cancer. Shortness of breath. Blood transfusion yesterday. Atrial fibrillation with RVR. EXAM: CT ANGIOGRAPHY CHEST WITH CONTRAST TECHNIQUE: Multidetector CT imaging of the chest was performed using the standard protocol during bolus administration of intravenous contrast. Multiplanar CT image reconstructions and MIPs were obtained to evaluate the vascular anatomy. RADIATION DOSE REDUCTION: This exam was performed according to the departmental dose-optimization program which includes automated exposure control, adjustment of the mA and/or kV according to patient size and/or use of iterative reconstruction technique. CONTRAST:  118mL OMNIPAQUE IOHEXOL 350 MG/ML SOLN COMPARISON:  Chest CT without contrast 12/27/2020. PET-CT 11/04/2020 FINDINGS: Cardiovascular: Good contrast bolus timing in the pulmonary arterial tree. Mild respiratory motion. No focal filling defect identified in the pulmonary arteries to suggest acute pulmonary embolism. Stable cardiac size, within  normal limits. No pericardial effusion. Little aortic contrast. Aortic and coronary artery calcified atherosclerosis. Mediastinum/Nodes: No discrete mediastinal mass or lymphadenopathy. Lungs/Pleura: Moderate bilateral  layering pleural effusions have mildly progressed since December. Cannot exclude complex fluid density, exudate. Compressive, enhancing atelectasis in both lungs. Superimposed atelectatic changes to the central airways which remain patent. Underlying centrilobular emphysema most apparent in the upper lobes. New compared to December curvilinear and masslike opacity in the lingula, but seems to be enhancing similar to the other atelectatic lung (series 8, image 68. Bronchopneumonia felt less likely. Primary left infrahilar tumor not well evaluated due to presence of effusions. Upper Abdomen: Visible liver, gallbladder, spleen, pancreas, adrenal glands and kidneys appear stable since December. Negative visible bowel. No free air or free fluid in the visible upper abdomen. Musculoskeletal: Previously augmented upper lumbar compression fracture. Osteopenia. No acute or suspicious osseous lesion identified. Review of the MIP images confirms the above findings. IMPRESSION: 1. No evidence of acute pulmonary embolus. 2. Progressed since December and now moderate bilateral layering pleural effusions, cannot exclude exudative fluid. Associated compressive atelectasis, and probably new round atelectasis in the lingula, with Bronchopneumonia felt less likely. Known primary left lower lobe tumor not well evaluated due to presence of effusions. 3. Aortic Atherosclerosis (ICD10-I70.0) and Emphysema (ICD10-J43.9). Electronically Signed   By: Genevie Ann M.D.   On: 02/11/2021 09:46   DG Chest Portable 1 View  Result Date: 02/11/2021 CLINICAL DATA:  71 year old female with shortness of breath. Lung cancer. EXAM: PORTABLE CHEST 1 VIEW COMPARISON:  Chest CT 12/27/2020 and earlier. FINDINGS: Portable AP semi upright view at 0738 hours. Continued veiling opacity at both lung bases greater on the right. Bilateral pleural effusions and atelectasis demonstrated in December. Underlying emphysema. Ventilation has not significantly changed.  Mediastinal contours remain normal. Visualized tracheal air column is within normal limits. No pneumothorax. No acute osseous abnormality identified. Paucity of bowel gas in the upper abdomen. IMPRESSION: Continued left greater than right pleural effusions and associated lung base opacity which most resembled atelectasis on December CT. Underlying Emphysema (ICD10-J43.9). No new cardiopulmonary abnormality. Electronically Signed   By: Genevie Ann M.D.   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 Rates more variable and at times down into the 90's. Will likely need DCCV next week. She has been compliant with Eliquis 5 mg BID for >3 weeks without missed doses, therefore, will not need TEE. Will work to arrange.  Time Spent Directly with Patient:  I have spent a total of 25 minutes with the patient reviewing hospital notes, telemetry, EKGs, labs and examining the patient as well as establishing an assessment and plan that was discussed personally with the patient.  > 50% of time was spent in direct patient care.  Length of Stay:  LOS: 1 day   Pixie Casino, MD, [facility], [place] Director of the Advanced Lipid Disorders &  Cardiovascular Risk Reduction Clinic Diplomate of the American Board of Clinical Lipidology Attending Cardiologist  Direct Dial: [phone]   Fax:  [phone]  Website:  [email or web address]  Nadean Corwin Shikara Mcauliffe 02/12/2021, 10:26 AM

## 2021-02-12 NOTE — H&P (View-Only) (Signed)
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 fib, now presents with regular narrow complex tachycardia  Subjective   Remains in flutter in the 140's - started on amiodarone yesterday.  Has been on Eliquis since the end of December 2022 without missed doses.  Objective   Vitals:   02/12/21 0310 02/12/21 0517 02/12/21 0833 02/12/21 0922  BP: 117/84 117/84 105/83   Pulse: (!) 134 (!) 134 (!) 145   Resp: 17 17    Temp: 98 F (36.7 C) 98 F (36.7 C)    TempSrc: Oral Oral    SpO2: 93% 92%  94%  Weight: 84 kg     Height:        Intake/Output Summary (Last 24 hours) at 02/12/2021 1026 Last data filed at 02/12/2021 5277 Gross per 24 hour  Intake 831.79 ml  Output 2850 ml  Net -2018.21 ml   Filed Weights   02/11/21 0714 02/12/21 0310  Weight: 84.6 kg 84 kg    Physical Exam   General appearance: alert and no distress Lungs: clear to auscultation bilaterally Heart: regular rate and rhythm and tachycardic Extremities: extremities normal, atraumatic, no cyanosis or edema Neurologic: Grossly normal  Inpatient Medications    Scheduled Meds:  apixaban  5 mg Oral BID   carisoprodol  350 mg Oral TID   clonazePAM  1 mg Oral QHS   escitalopram  10 mg Oral Daily   furosemide  40 mg Intravenous BID   gabapentin  600 mg Oral BID   ipratropium  0.5 mg Nebulization TID   linaclotide  145 mcg Oral q1600   magnesium oxide  400 mg Oral Daily   metoprolol tartrate  75 mg Oral BID   pantoprazole  40 mg Oral Daily   polyethylene glycol  34 g Oral q AM   polyvinyl alcohol  1 drop Both Eyes QHS   [START ON 02/13/2021] rosuvastatin  5 mg Oral Once per day on Mon Wed Fri   sodium chloride flush  3 mL Intravenous Q12H   sodium chloride  1 g Oral TID    Continuous  Infusions:  amiodarone 30 mg/hr (02/11/21 2213)    PRN Meds: acetaminophen **OR** acetaminophen, docusate sodium, fluticasone, guaiFENesin-dextromethorphan, levalbuterol, magic mouthwash, nystatin, ondansetron **OR** ondansetron (ZOFRAN) IV, polyvinyl alcohol, traMADol, zolpidem   Labs   Results for orders placed or performed during the hospital encounter of 02/11/21 (from the past 48 hour(s))  Basic metabolic panel     Status: Abnormal   Collection Time: 02/11/21  7:19 AM  Result Value Ref Range   Sodium 137 135 - 145 mmol/L   Potassium 4.0 3.5 - 5.1 mmol/L   Chloride 99 98 - 111 mmol/L   CO2 31 22 - 32 mmol/L   Glucose, Bld 115 (H) 70 - 99 mg/dL    Comment: Glucose reference range applies only to samples taken after fasting for at least 8 hours.   BUN 14 8 - 23 mg/dL   Creatinine, Ser 0.86 0.44 - 1.00 mg/dL   Calcium 8.4 (L) 8.9 - 10.3 mg/dL   GFR, Estimated >60 >60 mL/min    Comment: (NOTE) Calculated using the CKD-EPI Creatinine Equation (2021)    Anion gap 7 5 - 15    Comment: Performed  at [facility], [place] [address], [place], [place] [address]  Troponin I (High Sensitivity)     Status: None   Collection Time: 02/11/21  7:19 AM  Result Value Ref Range   Troponin I (High Sensitivity) 12 <18 ng/L    Comment: (NOTE) Elevated high sensitivity troponin I (hsTnI) values and significant  changes across serial measurements may suggest ACS but many other  chronic and acute conditions are known to elevate hsTnI results.  Refer to the "Links" section for chest pain algorithms and additional  guidance. Performed at [facility], [place] [address] [place], [place] [address]   Magnesium     Status: Abnormal   Collection Time: 02/11/21  7:19 AM  Result Value Ref Range   Magnesium 1.3 (L) 1.7 - 2.4 mg/dL    Comment: Performed at [facility] [address] [place], [place] [address]  CBC with Differential     Status: Abnormal   Collection Time: 02/11/21  7:24 AM   Result Value Ref Range   WBC 6.1 4.0 - 10.5 K/uL   RBC 3.18 (L) 3.87 - 5.11 MIL/uL   Hemoglobin 10.6 (L) 12.0 - 15.0 g/dL   HCT 31.2 (L) 36.0 - 46.0 %   MCV 98.1 80.0 - 100.0 fL   MCH 33.3 26.0 - 34.0 pg   MCHC 34.0 30.0 - 36.0 g/dL   RDW 23.8 (H) 11.5 - 15.5 %   Platelets 53 (L) 150 - 400 K/uL    Comment: Immature Platelet Fraction may be clinically indicated, consider ordering this additional test [id number] REPEATED TO VERIFY PLATELET COUNT CONFIRMED BY SMEAR    nRBC 0.8 (H) 0.0 - 0.2 %   Neutrophils Relative % 87 %   Neutro Abs 5.3 1.7 - 7.7 K/uL   Lymphocytes Relative 4 %   Lymphs Abs 0.2 (L) 0.7 - 4.0 K/uL   Monocytes Relative 7 %   Monocytes Absolute 0.5 0.1 - 1.0 K/uL   Eosinophils Relative 0 %   Eosinophils Absolute 0.0 0.0 - 0.5 K/uL   Basophils Relative 0 %   Basophils Absolute 0.0 0.0 - 0.1 K/uL   Immature Granulocytes 2 %   Abs Immature Granulocytes 0.11 (H) 0.00 - 0.07 K/uL   Ovalocytes PRESENT     Comment: Performed at [facility], [place] [address], [place], [place] [address]  Brain natriuretic peptide     Status: Abnormal   Collection Time: 02/11/21  7:24 AM  Result Value Ref Range   B Natriuretic Peptide 420.9 (H) 0.0 - 100.0 pg/mL    Comment: Performed at [facility] [address] [place],  [address]  Resp Panel by RT-PCR (Flu A&B, Covid) Nasopharyngeal Swab     Status: None   Collection Time: 02/11/21  7:30 AM   Specimen: Nasopharyngeal Swab; Nasopharyngeal(NP) swabs in vial transport medium  Result Value Ref Range   SARS Coronavirus 2 by RT PCR NEGATIVE NEGATIVE    Comment: (NOTE) SARS-CoV-2 target nucleic acids are NOT DETECTED.  The SARS-CoV-2 RNA is generally detectable in upper respiratory specimens during the acute phase of infection. The lowest concentration of SARS-CoV-2 viral copies this assay can detect is 138 copies/mL. A negative result does not preclude SARS-Cov-2 infection and should not be used as the sole basis  for treatment or other patient management decisions. A negative result may occur with  improper specimen collection/handling, submission of specimen other than nasopharyngeal swab, presence of viral mutation(s) within the  areas targeted by this assay, and inadequate number of viral copies(<138 copies/mL). A negative result must be combined with clinical observations, patient history, and epidemiological information. The expected result is Negative.  Fact Sheet for Patients:  EntrepreneurPulse.com.au  Fact Sheet for Healthcare Providers:  IncredibleEmployment.be  This test is no t yet approved or cleared by the [place] FDA and  has been authorized for detection and/or diagnosis of SARS-CoV-2 by FDA under an Emergency Use Authorization (EUA). This EUA will remain  in effect (meaning this test can be used) for the duration of the COVID-19 declaration under Section 564(b)(1) of the Act, 21 U.S.C.section 360bbb-3(b)(1), unless the authorization is terminated  or revoked sooner.       Influenza A by PCR NEGATIVE NEGATIVE   Influenza B by PCR NEGATIVE NEGATIVE    Comment: (NOTE) The Xpert Xpress SARS-CoV-2/FLU/RSV plus assay is intended as an aid in the diagnosis of influenza from Nasopharyngeal swab specimens and should not be used as a sole basis for treatment. Nasal washings and aspirates are unacceptable for Xpert Xpress SARS-CoV-2/FLU/RSV testing.  Fact Sheet for Patients: EntrepreneurPulse.com.au  Fact Sheet for Healthcare Providers: IncredibleEmployment.be  This test is not yet approved or cleared by the [place] FDA and has been authorized for detection and/or diagnosis of SARS-CoV-2 by FDA under an Emergency Use Authorization (EUA). This EUA will remain in effect (meaning this test can be used) for the duration of the COVID-19 declaration under Section 564(b)(1) of the Act, 21  U.S.C. section 360bbb-3(b)(1), unless the authorization is terminated or revoked.  Performed at [facility], [place] [address], [place], [place] [address]   Troponin I (High Sensitivity)     Status: None   Collection Time: 02/11/21  9:25 AM  Result Value Ref Range   Troponin I (High Sensitivity) 10 <18 ng/L    Comment: (NOTE) Elevated high sensitivity troponin I (hsTnI) values and significant  changes across serial measurements may suggest ACS but many other  chronic and acute conditions are known to elevate hsTnI results.  Refer to the "Links" section for chest pain algorithms and additional  guidance. Performed at [facility], [place] [address], [place], [place] [address]   CBC     Status: Abnormal   Collection Time: 02/12/21  6:32 AM  Result Value Ref Range   WBC 2.8 (L) 4.0 - 10.5 K/uL   RBC 3.25 (L) 3.87 - 5.11 MIL/uL   Hemoglobin 10.9 (L) 12.0 - 15.0 g/dL   HCT 31.4 (L) 36.0 - 46.0 %   MCV 96.6 80.0 - 100.0 fL   MCH 33.5 26.0 - 34.0 pg   MCHC 34.7 30.0 - 36.0 g/dL   RDW 23.2 (H) 11.5 - 15.5 %   Platelets 56 (L) 150 - 400 K/uL    Comment: Immature Platelet Fraction may be clinically indicated, consider ordering this additional test [id number] CONSISTENT WITH PREVIOUS RESULT REPEATED TO VERIFY    nRBC 1.1 (H) 0.0 - 0.2 %    Comment: Performed at [facility], [place] [address] [place], [place] [address]  Basic metabolic panel     Status: Abnormal   Collection Time: 02/12/21  6:32 AM  Result Value Ref Range   Sodium 136 135 - 145 mmol/L   Potassium 3.1 (L) 3.5 - 5.1 mmol/L   Chloride 91 (L) 98 - 111 mmol/L   CO2 32 22 - 32 mmol/L   Glucose, Bld 108 (H) 70 - 99 mg/dL    Comment:  Glucose reference range applies only to samples taken after fasting for at least 8 hours.   BUN 10 8 - 23 mg/dL   Creatinine, Ser 0.74 0.44 - 1.00 mg/dL   Calcium 8.5 (L) 8.9 - 10.3 mg/dL   GFR, Estimated >60 >60 mL/min    Comment: (NOTE) Calculated using the CKD-EPI  Creatinine Equation (2021)    Anion gap 13 5 - 15    Comment: Performed at [facility] [address] [place], [place] [address]  Magnesium     Status: Abnormal   Collection Time: 02/12/21  6:32 AM  Result Value Ref Range   Magnesium 1.4 (L) 1.7 - 2.4 mg/dL    Comment: Performed at [facility] [address], [place], [place] [address]  Procalcitonin - Baseline     Status: None   Collection Time: 02/12/21  6:32 AM  Result Value Ref Range   Procalcitonin <0.10 ng/mL    Comment:        Interpretation: PCT (Procalcitonin) <= 0.5 ng/mL: Systemic infection (sepsis) is not likely. Local bacterial infection is possible. (NOTE)       Sepsis PCT Algorithm           Lower Respiratory Tract                                      Infection PCT Algorithm    ----------------------------     ----------------------------         PCT < 0.25 ng/mL                PCT < 0.10 ng/mL          Strongly encourage             Strongly discourage   discontinuation of antibiotics    initiation of antibiotics    ----------------------------     -----------------------------       PCT 0.25 - 0.50 ng/mL            PCT 0.10 - 0.25 ng/mL               OR       >80% decrease in PCT            Discourage initiation of                                            antibiotics      Encourage discontinuation           of antibiotics    ----------------------------     -----------------------------         PCT >= 0.50 ng/mL              PCT 0.26 - 0.50 ng/mL               AND        <80% decrease in PCT             Encourage initiation of                                             antibiotics       Encourage continuation  of antibiotics    ----------------------------     -----------------------------        PCT >= 0.50 ng/mL                  PCT > 0.50 ng/mL               AND         increase in PCT                  Strongly encourage                                      initiation of  antibiotics    Strongly encourage escalation           of antibiotics                                     -----------------------------                                           PCT <= 0.25 ng/mL                                                 OR                                        > 80% decrease in PCT                                      Discontinue / Do not initiate                                             antibiotics  Performed at [facility], [address] [place], [place] [address]     ECG   N/A  Telemetry   Atrial flutter/tachycardia in the 120-140's - Personally Reviewed  Radiology    CT Angio Chest PE W and/or Wo Contrast  Result Date: 02/11/2021 CLINICAL DATA:  71 year old female with lung cancer. Shortness of breath. Blood transfusion yesterday. Atrial fibrillation with RVR. EXAM: CT ANGIOGRAPHY CHEST WITH CONTRAST TECHNIQUE: Multidetector CT imaging of the chest was performed using the standard protocol during bolus administration of intravenous contrast. Multiplanar CT image reconstructions and MIPs were obtained to evaluate the vascular anatomy. RADIATION DOSE REDUCTION: This exam was performed according to the departmental dose-optimization program which includes automated exposure control, adjustment of the mA and/or kV according to patient size and/or use of iterative reconstruction technique. CONTRAST:  134mL OMNIPAQUE IOHEXOL 350 MG/ML SOLN COMPARISON:  Chest CT without contrast 12/27/2020. PET-CT 11/04/2020 FINDINGS: Cardiovascular: Good contrast bolus timing in the pulmonary arterial tree. Mild respiratory motion. No focal filling defect identified in the pulmonary arteries to suggest acute pulmonary embolism. Stable cardiac size, within  normal limits. No pericardial effusion. Little aortic contrast. Aortic and coronary artery calcified atherosclerosis. Mediastinum/Nodes: No discrete mediastinal mass or lymphadenopathy. Lungs/Pleura: Moderate bilateral  layering pleural effusions have mildly progressed since December. Cannot exclude complex fluid density, exudate. Compressive, enhancing atelectasis in both lungs. Superimposed atelectatic changes to the central airways which remain patent. Underlying centrilobular emphysema most apparent in the upper lobes. New compared to December curvilinear and masslike opacity in the lingula, but seems to be enhancing similar to the other atelectatic lung (series 8, image 68. Bronchopneumonia felt less likely. Primary left infrahilar tumor not well evaluated due to presence of effusions. Upper Abdomen: Visible liver, gallbladder, spleen, pancreas, adrenal glands and kidneys appear stable since December. Negative visible bowel. No free air or free fluid in the visible upper abdomen. Musculoskeletal: Previously augmented upper lumbar compression fracture. Osteopenia. No acute or suspicious osseous lesion identified. Review of the MIP images confirms the above findings. IMPRESSION: 1. No evidence of acute pulmonary embolus. 2. Progressed since December and now moderate bilateral layering pleural effusions, cannot exclude exudative fluid. Associated compressive atelectasis, and probably new round atelectasis in the lingula, with Bronchopneumonia felt less likely. Known primary left lower lobe tumor not well evaluated due to presence of effusions. 3. Aortic Atherosclerosis (ICD10-I70.0) and Emphysema (ICD10-J43.9). Electronically Signed   By: Genevie Ann M.D.   On: 02/11/2021 09:46   DG Chest Portable 1 View  Result Date: 02/11/2021 CLINICAL DATA:  71 year old female with shortness of breath. Lung cancer. EXAM: PORTABLE CHEST 1 VIEW COMPARISON:  Chest CT 12/27/2020 and earlier. FINDINGS: Portable AP semi upright view at 0738 hours. Continued veiling opacity at both lung bases greater on the right. Bilateral pleural effusions and atelectasis demonstrated in December. Underlying emphysema. Ventilation has not significantly changed.  Mediastinal contours remain normal. Visualized tracheal air column is within normal limits. No pneumothorax. No acute osseous abnormality identified. Paucity of bowel gas in the upper abdomen. IMPRESSION: Continued left greater than right pleural effusions and associated lung base opacity which most resembled atelectasis on December CT. Underlying Emphysema (ICD10-J43.9). No new cardiopulmonary abnormality. Electronically Signed   By: Genevie Ann M.D.   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 Rates more variable and at times down into the 90's. Will likely need DCCV next week. She has been compliant with Eliquis 5 mg BID for >3 weeks without missed doses, therefore, will not need TEE. Will work to arrange.  Time Spent Directly with Patient:  I have spent a total of 25 minutes with the patient reviewing hospital notes, telemetry, EKGs, labs and examining the patient as well as establishing an assessment and plan that was discussed personally with the patient.  > 50% of time was spent in direct patient care.  Length of Stay:  LOS: 1 day   Pixie Casino, MD, [facility], [place] Director of the Advanced Lipid Disorders &  Cardiovascular Risk Reduction Clinic Diplomate of the American Board of Clinical Lipidology Attending Cardiologist  Direct Dial: [phone]   Fax:  [phone]  Website:  [email or web address]  Nadean Corwin Lexi Conaty 02/12/2021, 10:26 AM

## 2021-02-12 NOTE — Assessment & Plan Note (Addendum)
K has been corrected to 4,1  Renal function stable with serum cr at 0,79.

## 2021-02-12 NOTE — Assessment & Plan Note (Deleted)
Atrial flutter - Was briefly on a Cardizem drip, subsequently switched to amiodarone gtt. -Continue p.o. metoprolol -Continue Eliquis -Heart rate remains poorly controlled, plan for cardioversion today

## 2021-02-12 NOTE — TOC Progression Note (Signed)
Transition of Care [facility]) - Progression Note    Patient Details  Name: Patricia [name] MRN: [id number] Date of Birth: 21-Jan-1950  Transition of Care [facility]) CM/SW Contact  Zenon Mayo, RN Phone Number: 02/12/2021, 8:44 AM  Clinical Narrative:     Transition of Care [facility]) Screening Note   Patient Details  Name: Patricia [name] Date of Birth: January 18, 1950   Transition of Care [facility]) CM/SW Contact:    Zenon Mayo, RN Phone Number: 02/12/2021, 8:44 AM    Transition of Care Department [facility]) has reviewed patient and no TOC needs have been identified at this time. We will continue to monitor patient advancement through interdisciplinary progression rounds. If new patient transition needs arise, please place a TOC consult.          Expected Discharge Plan and Services                                                 Social Determinants of Health (SDOH) Interventions    Readmission Risk Interventions Readmission Risk Prevention Plan 10/17/2020  Post Dischage Appt Complete  Medication Screening Complete  Transportation Screening Complete  Some recent data might be hidden

## 2021-02-12 NOTE — Progress Notes (Signed)
PROGRESS NOTE    Patricia [name]  HCW:[id number] DOB: 11-08-1950 DOA: 02/11/2021 PCP: [name], MD  Brief Narrative: 70/F with limited stage small cell lung cancer-recently completed chemo, XRT, chronic diastolic CHF, paroxysmal atrial fibrillation, hypertension presented to the ED with progressive dyspnea on exertion for 2 months worse in the last 2 weeks.  Recently received blood transfusion for anemia from chemotherapy. -In the ED she was tachycardic, heart rate in the 140s, atrial arrhythmia, hypoxic to 85%, BNP 420, CT chest noted progressive bilateral pleural effusions   Subjective: -Still with shortness of breath, bothered by her heart rate being up  Assessment and Plan: * Acute on chronic respiratory failure with hypoxia (HCC)  Acute on chronic diastolic CHF  -Likely triggered by atrial arrhythmia/A. fib  -Last echo with EF 17-61%, grade 1 diastolic dysfunction in 6/07  -Continue IV Lasix 40 Mg twice daily, she is -2.6 L -Cardiology following  Paroxysmal atrial fibrillation with RVR ([place])- (present on admission) Atrial tachycardia - Was briefly on a Cardizem drip yesterday, subsequently switched to amiodarone gtt. -Continue p.o. 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 Julien Nordmann of oncology - completed chemotherapy treatments of cisplatin and etoposide approximately 2 weeks ago.  She has received radiation treatment of her chest and there are plans for prophylactic whole brain radiation in the near future.  Antineoplastic chemotherapy induced pancytopenia (HCC)- (present on admission) - Recent blood transfusion  -Hemoglobin stable, continue to monitor  Hypokalemia Replace  Hypomagnesemia- (present on admission) - Replace IV and p.o.  Dyslipidemia- (present on admission) -  Continue Crestor  Anxiety- (present on admission) - Continue lexapro and clonazepam   DVT prophylaxis: Eliquis Code Status: DNR Family Communication: Discussed patient in detail, no family at bedside Disposition Plan:   Consultants:  Cardiology  Procedures:   Antimicrobials:    Objective: Vitals:   02/12/21 0310 02/12/21 0517 02/12/21 0833 02/12/21 0922  BP: 117/84 117/84 105/83   Pulse: (!) 134 (!) 134 (!) 145   Resp: 17 17    Temp: 98 F (36.7 C) 98 F (36.7 C)    TempSrc: Oral Oral    SpO2: 93% 92%  94%  Weight: 84 kg     Height:        Intake/Output Summary (Last 24 hours) at 02/12/2021 1048 Last data filed at 02/12/2021 3710 Gross per 24 hour  Intake 731.79 ml  Output 2850 ml  Net -2118.21 ml   Filed Weights   02/11/21 0714 02/12/21 0310  Weight: 84.6 kg 84 kg    Examination:  General exam: Pleasant female sitting up in bed, AAOx3, no distress HEENT: Positive JVD CVS: S1-S2, regular rhythm, tachycardic Lungs: Decreased breath sounds to bases, few basilar rales Abdomen: Soft, nontender, bowel sounds present Extremities: 1+ edema Skin: No rashes Psychiatry:  Mood & affect appropriate.     Data Reviewed:   CBC: Recent Labs  Lab 02/08/21 0820 02/11/21 0724 02/12/21 0632  WBC 3.1* 6.1 2.8*  NEUTROABS 2.3 5.3  --   HGB 7.4* 10.6* 10.9*  HCT 21.6* 31.2* 31.4*  MCV 103.8* 98.1 96.6  PLT 41* 53* 56*   Basic Metabolic Panel: Recent Labs  Lab 02/08/21 0820 02/11/21 0719 02/12/21 0632  NA 138 137 136  K 4.0 4.0 3.1*  CL 99 99 91*  CO2 31 31 32  GLUCOSE 102* 115* 108*  BUN 16 14 10   CREATININE 0.76 0.86 0.74  CALCIUM 8.7* 8.4* 8.5*  MG 1.3* 1.3* 1.4*   GFR: Estimated Creatinine Clearance: 70.5 mL/min (by C-G formula based on SCr of 0.74 mg/dL). Liver Function Tests: Recent Labs  Lab 02/08/21 0820  AST 21  ALT 23  ALKPHOS 94  BILITOT 0.4  PROT 6.2*  ALBUMIN 3.7   No results for input(s): LIPASE, AMYLASE in the last 168  hours. No results for input(s): AMMONIA in the last 168 hours. Coagulation Profile: No results for input(s): INR, PROTIME in the last 168 hours. Cardiac Enzymes: No results for input(s): CKTOTAL, CKMB, CKMBINDEX, TROPONINI in the last 168 hours. BNP (last 3 results) No results for input(s): PROBNP in the last 8760 hours. HbA1C: No results for input(s): HGBA1C in the last 72 hours. CBG: No results for input(s): GLUCAP in the last 168 hours. Lipid Profile: No results for input(s): CHOL, HDL, LDLCALC, TRIG, CHOLHDL, LDLDIRECT in the last 72 hours. Thyroid Function Tests: No results for input(s): TSH, T4TOTAL, FREET4, T3FREE, THYROIDAB in the last 72 hours. Anemia Panel: No results for input(s): VITAMINB12, FOLATE, FERRITIN, TIBC, IRON, RETICCTPCT in the last 72 hours. Urine analysis:    Component Value Date/Time   COLORURINE YELLOW 12/25/2020 1700   APPEARANCEUR CLEAR 12/25/2020 1700   LABSPEC 1.025 12/25/2020 1700   PHURINE 6.0 12/25/2020 1700   GLUCOSEU NEGATIVE 12/25/2020 1700   HGBUR NEGATIVE 12/25/2020 1700   BILIRUBINUR NEGATIVE 12/25/2020 1700   KETONESUR NEGATIVE 12/25/2020 1700   PROTEINUR NEGATIVE 12/25/2020 1700   NITRITE NEGATIVE 12/25/2020 1700   LEUKOCYTESUR NEGATIVE 12/25/2020 1700   Sepsis Labs: @LABRCNTIP (procalcitonin:4,lacticidven:4)  ) Recent Results (from the past 240 hour(s))  Resp Panel by RT-PCR (Flu A&B, Covid) Nasopharyngeal Swab     Status: None   Collection Time: 02/11/21  7:30 AM   Specimen: Nasopharyngeal Swab; Nasopharyngeal(NP) swabs in vial transport medium  Result Value Ref Range Status   SARS Coronavirus 2 by RT PCR NEGATIVE NEGATIVE Final    Comment: (NOTE) SARS-CoV-2 target nucleic acids are NOT DETECTED.  The SARS-CoV-2 RNA is generally detectable in upper respiratory specimens during the acute phase of infection. The lowest concentration of SARS-CoV-2 viral copies this assay can detect is 138 copies/mL. A negative result does not  preclude SARS-Cov-2 infection and should not be used as the sole basis for treatment or other patient management decisions. A negative result may occur with  improper specimen collection/handling, submission of specimen other than nasopharyngeal swab, presence of viral mutation(s) within the areas targeted by this assay, and inadequate number of viral copies(<138 copies/mL). A negative result must be combined with clinical observations, patient history, and epidemiological information. The expected result is Negative.  Fact Sheet for Patients:  EntrepreneurPulse.com.au  Fact Sheet for Healthcare Providers:  IncredibleEmployment.be  This test is no t yet approved or cleared by the [place] FDA and  has been authorized for detection and/or diagnosis of SARS-CoV-2 by FDA under an Emergency Use Authorization (EUA). This EUA will remain  in effect (meaning this test can be used) for the duration of the COVID-19 declaration under Section 564(b)(1) of the Act, 21 U.S.C.section 360bbb-3(b)(1), unless the authorization is terminated  or revoked sooner.       Influenza A by PCR NEGATIVE NEGATIVE Final   Influenza B by PCR NEGATIVE NEGATIVE Final    Comment: (NOTE) The Xpert Xpress SARS-CoV-2/FLU/RSV plus assay is intended as an aid in the diagnosis of influenza from  Nasopharyngeal swab specimens and should not be used as a sole basis for treatment. Nasal washings and aspirates are unacceptable for Xpert Xpress SARS-CoV-2/FLU/RSV testing.  Fact Sheet for Patients: EntrepreneurPulse.com.au  Fact Sheet for Healthcare Providers: IncredibleEmployment.be  This test is not yet approved or cleared by the [place] FDA and has been authorized for detection and/or diagnosis of SARS-CoV-2 by FDA under an Emergency Use Authorization (EUA). This EUA will remain in effect (meaning this test can be used) for the  duration of the COVID-19 declaration under Section 564(b)(1) of the Act, 21 U.S.C. section 360bbb-3(b)(1), unless the authorization is terminated or revoked.  Performed at [facility], [place] [address], [place], [place] [address]      Radiology Studies: CT Angio Chest PE W and/or Wo Contrast  Result Date: 02/11/2021 CLINICAL DATA:  71 year old female with lung cancer. Shortness of breath. Blood transfusion yesterday. Atrial fibrillation with RVR. EXAM: CT ANGIOGRAPHY CHEST WITH CONTRAST TECHNIQUE: Multidetector CT imaging of the chest was performed using the standard protocol during bolus administration of intravenous contrast. Multiplanar CT image reconstructions and MIPs were obtained to evaluate the vascular anatomy. RADIATION DOSE REDUCTION: This exam was performed according to the departmental dose-optimization program which includes automated exposure control, adjustment of the mA and/or kV according to patient size and/or use of iterative reconstruction technique. CONTRAST:  131mL OMNIPAQUE IOHEXOL 350 MG/ML SOLN COMPARISON:  Chest CT without contrast 12/27/2020. PET-CT 11/04/2020 FINDINGS: Cardiovascular: Good contrast bolus timing in the pulmonary arterial tree. Mild respiratory motion. No focal filling defect identified in the pulmonary arteries to suggest acute pulmonary embolism. Stable cardiac size, within normal limits. No pericardial effusion. Little aortic contrast. Aortic and coronary artery calcified atherosclerosis. Mediastinum/Nodes: No discrete mediastinal mass or lymphadenopathy. Lungs/Pleura: Moderate bilateral layering pleural effusions have mildly progressed since December. Cannot exclude complex fluid density, exudate. Compressive, enhancing atelectasis in both lungs. Superimposed atelectatic changes to the central airways which remain patent. Underlying centrilobular emphysema most apparent in the upper lobes. New compared to December curvilinear and masslike opacity in  the lingula, but seems to be enhancing similar to the other atelectatic lung (series 8, image 68. Bronchopneumonia felt less likely. Primary left infrahilar tumor not well evaluated due to presence of effusions. Upper Abdomen: Visible liver, gallbladder, spleen, pancreas, adrenal glands and kidneys appear stable since December. Negative visible bowel. No free air or free fluid in the visible upper abdomen. Musculoskeletal: Previously augmented upper lumbar compression fracture. Osteopenia. No acute or suspicious osseous lesion identified. Review of the MIP images confirms the above findings. IMPRESSION: 1. No evidence of acute pulmonary embolus. 2. Progressed since December and now moderate bilateral layering pleural effusions, cannot exclude exudative fluid. Associated compressive atelectasis, and probably new round atelectasis in the lingula, with Bronchopneumonia felt less likely. Known primary left lower lobe tumor not well evaluated due to presence of effusions. 3. Aortic Atherosclerosis (ICD10-I70.0) and Emphysema (ICD10-J43.9). Electronically Signed   By: Genevie Ann M.D.   On: 02/11/2021 09:46   DG Chest Portable 1 View  Result Date: 02/11/2021 CLINICAL DATA:  71 year old female with shortness of breath. Lung cancer. EXAM: PORTABLE CHEST 1 VIEW COMPARISON:  Chest CT 12/27/2020 and earlier. FINDINGS: Portable AP semi upright view at 0738 hours. Continued veiling opacity at both lung bases greater on the right. Bilateral pleural effusions and atelectasis demonstrated in December. Underlying emphysema. Ventilation has not significantly changed. Mediastinal contours remain normal. Visualized tracheal air column is within normal limits. No pneumothorax. No acute osseous abnormality identified.  Paucity of bowel gas in the upper abdomen. IMPRESSION: Continued left greater than right pleural effusions and associated lung base opacity which most resembled atelectasis on December CT. Underlying Emphysema (ICD10-J43.9).  No new cardiopulmonary abnormality. Electronically Signed   By: Genevie Ann M.D.   On: 02/11/2021 08:04     Scheduled Meds:  apixaban  5 mg Oral BID   carisoprodol  350 mg Oral TID   clonazePAM  1 mg Oral QHS   escitalopram  10 mg Oral Daily   furosemide  40 mg Intravenous BID   gabapentin  600 mg Oral BID   ipratropium  0.5 mg Nebulization TID   linaclotide  145 mcg Oral q1600   magnesium oxide  400 mg Oral Daily   metoprolol tartrate  75 mg Oral BID   pantoprazole  40 mg Oral Daily   polyethylene glycol  34 g Oral q AM   polyvinyl alcohol  1 drop Both Eyes QHS   [START ON 02/13/2021] rosuvastatin  5 mg Oral Once per day on Mon Wed Fri   sodium chloride flush  3 mL Intravenous Q12H   sodium chloride  1 g Oral TID   Continuous Infusions:  amiodarone 30 mg/hr (02/11/21 2213)     LOS: 1 day    Time spent: 70min    Domenic Polite, MD [facility]   02/12/2021, 10:48 AM

## 2021-02-13 ENCOUNTER — Inpatient Hospital Stay (HOSPITAL_COMMUNITY): Payer: Medicare HMO | Admitting: Certified Registered Nurse Anesthetist

## 2021-02-13 ENCOUNTER — Encounter (HOSPITAL_COMMUNITY)
Admission: EM | Disposition: A | Discharge status: Home / Self Care | Payer: Self-pay | Source: Home / Self Care | Attending: Internal Medicine

## 2021-02-13 ENCOUNTER — Ambulatory Visit
Admission: RE | Admit: 2021-02-13 | Discharge: 2021-02-13 | Disposition: A | Discharge status: Home / Self Care | Payer: Medicare HMO | Source: Ambulatory Visit | Attending: Radiation Oncology | Admitting: Radiation Oncology

## 2021-02-13 ENCOUNTER — Inpatient Hospital Stay (HOSPITAL_COMMUNITY): Payer: Medicare HMO

## 2021-02-13 ENCOUNTER — Encounter (HOSPITAL_COMMUNITY): Payer: Self-pay | Admitting: Internal Medicine

## 2021-02-13 DIAGNOSIS — I5033 Acute on chronic diastolic (congestive) heart failure: Secondary | ICD-10-CM

## 2021-02-13 DIAGNOSIS — I4892 Unspecified atrial flutter: Secondary | ICD-10-CM | POA: Diagnosis not present

## 2021-02-13 DIAGNOSIS — C3432 Malignant neoplasm of lower lobe, left bronchus or lung: Secondary | ICD-10-CM

## 2021-02-13 HISTORY — PX: CARDIOVERSION: SHX1299

## 2021-02-13 LAB — ECHOCARDIOGRAM LIMITED
AR max vel: 2.6 cm2
AV Peak grad: 3.4 mmHg
Ao pk vel: 0.92 m/s
Area-P 1/2: 6.54 cm2
Calc EF: 57.2 %
Height: 66 in
S' Lateral: 3.1 cm
Single Plane A2C EF: 58.3 %
Single Plane A4C EF: 57.2 %
Weight: 2962.98 oz

## 2021-02-13 LAB — CBC
HCT: 31.9 % — ABNORMAL LOW (ref 36.0–46.0)
Hemoglobin: 11 g/dL — ABNORMAL LOW (ref 12.0–15.0)
MCH: 33.7 pg (ref 26.0–34.0)
MCHC: 34.5 g/dL (ref 30.0–36.0)
MCV: 97.9 fL (ref 80.0–100.0)
Platelets: 64 10*3/uL — ABNORMAL LOW (ref 150–400)
RBC: 3.26 MIL/uL — ABNORMAL LOW (ref 3.87–5.11)
RDW: 22.9 % — ABNORMAL HIGH (ref 11.5–15.5)
WBC: 2.5 10*3/uL — ABNORMAL LOW (ref 4.0–10.5)
nRBC: 0 % (ref 0.0–0.2)

## 2021-02-13 LAB — BASIC METABOLIC PANEL
Anion gap: 10 (ref 5–15)
BUN: 10 mg/dL (ref 8–23)
CO2: 33 mmol/L — ABNORMAL HIGH (ref 22–32)
Calcium: 8.6 mg/dL — ABNORMAL LOW (ref 8.9–10.3)
Chloride: 94 mmol/L — ABNORMAL LOW (ref 98–111)
Creatinine, Ser: 0.8 mg/dL (ref 0.44–1.00)
GFR, Estimated: >60 mL/min (ref >60)
Glucose, Bld: 114 mg/dL — ABNORMAL HIGH (ref 70–99)
Potassium: 4 mmol/L (ref 3.5–5.1)
Sodium: 137 mmol/L (ref 135–145)

## 2021-02-13 LAB — PROTIME-INR
INR: 1.4 — ABNORMAL HIGH (ref 0.8–1.2)
Prothrombin Time: 16.7 seconds — ABNORMAL HIGH (ref 11.4–15.2)

## 2021-02-13 LAB — MAGNESIUM: Magnesium: 1.8 mg/dL (ref 1.7–2.4)

## 2021-02-13 SURGERY — CARDIOVERSION
Anesthesia: General

## 2021-02-13 MED ORDER — MEMANTINE HCL 5 MG PO TABS: ORAL_TABLET | ORAL | 0 refills | Status: DC

## 2021-02-13 MED ORDER — AMIODARONE IV BOLUS ONLY 150 MG/100ML: 150.0000 mg | Freq: Once | INTRAVENOUS | Status: DC

## 2021-02-13 MED ORDER — AMIODARONE LOAD VIA INFUSION
150.0000 mg | Freq: Once | INTRAVENOUS | Status: AC
  Administered 2021-02-13: 150 mg via INTRAVENOUS
  Filled 2021-02-13: qty 83.34

## 2021-02-13 MED ORDER — LIDOCAINE 2% (20 MG/ML) 5 ML SYRINGE
INTRAMUSCULAR | Status: DC | PRN
  Administered 2021-02-13: 60 mg via INTRAVENOUS

## 2021-02-13 MED ORDER — SODIUM CHLORIDE 0.9 % IV SOLN: INTRAVENOUS | Status: DC

## 2021-02-13 MED ORDER — SODIUM CHLORIDE 0.9 % IV SOLN
INTRAVENOUS | Status: AC | PRN
  Administered 2021-02-13: 500 mL via INTRAMUSCULAR

## 2021-02-13 MED ORDER — SODIUM CHLORIDE 0.9 % IV SOLN: INTRAVENOUS | Status: DC | PRN

## 2021-02-13 MED ORDER — PROPOFOL 10 MG/ML IV BOLUS
INTRAVENOUS | Status: DC | PRN
Start: 2021-02-13 — End: 2021-02-13
  Administered 2021-02-13: 80 mg via INTRAVENOUS

## 2021-02-13 MED ORDER — MEMANTINE HCL 10 MG PO TABS
10.0000 mg | ORAL_TABLET | Freq: Two times a day (BID) | ORAL | 4 refills | Status: DC
Start: 2021-02-13 — End: 2021-09-23

## 2021-02-13 MED ORDER — PHENYLEPHRINE 40 MCG/ML (10ML) SYRINGE FOR IV PUSH (FOR BLOOD PRESSURE SUPPORT)
PREFILLED_SYRINGE | INTRAVENOUS | Status: DC | PRN
  Administered 2021-02-13: 120 ug via INTRAVENOUS

## 2021-02-13 NOTE — Anesthesia Procedure Notes (Signed)
Procedure Name: General with mask airway Date/Time: 02/13/2021 10:44 AM Performed by: Harden Mo, CRNA Pre-anesthesia Checklist: Patient identified, Emergency Drugs available, Suction available and Patient being monitored Patient Re-evaluated:Patient Re-evaluated prior to induction Oxygen Delivery Method: Ambu bag Preoxygenation: Pre-oxygenation with 100% oxygen Induction Type: IV induction Placement Confirmation: positive ETCO2 and breath sounds checked- equal and bilateral Dental Injury: Teeth and Oropharynx as per pre-operative assessment

## 2021-02-13 NOTE — Interval H&P Note (Signed)
History and Physical Interval Note:  02/13/2021 10:51 AM  Patricia [name]  has presented today for surgery, with the diagnosis of afib.  The various methods of treatment have been discussed with the patient and family. After consideration of risks, benefits and other options for treatment, the patient has consented to  Procedure(s): CARDIOVERSION (N/A) as a surgical intervention.  The patient's history has been reviewed, patient examined, no change in status, stable for surgery.  I have reviewed the patient's chart and labs.  Questions were answered to the patient's satisfaction.     [name] A Madyson Lukach

## 2021-02-13 NOTE — TOC Initial Note (Signed)
Transition of Care [facility]) - Initial/Assessment Note    Patient Details  Name: Patricia [name] MRN: [id number] Date of Birth: 05-25-1950  Transition of Care [facility]) CM/SW Contact:    Bethena Roys, RN Phone Number: 02/13/2021, 1:17 PM  Clinical Narrative:  Risk for readmission assessment completed. Case Manager spoke with patient regarding disposition needs. Patient is currently active with [facility] for RN/PT- resumption orders will be needed with F2F. PTA patient was from home with spouse-that takes her to all appointments. Patient gets her medications from [facility] and she has DME bedside commode, rolling walker, shower chair and oxygen via [employer] in the home. Patient states she has a portable tank for travel home. Case Manager will continue to follow for additional needs.    Expected Discharge Plan: [place] Barriers to Discharge: No Barriers Identified   Patient Goals and CMS Choice Patient states their goals for this hospitalization and ongoing recovery are:: to return home with home health services      Expected Discharge Plan and Services Expected Discharge Plan: [place] In-house Referral: NA Discharge Planning Services: CM Consult Post Acute Care Choice: Home Health, Resumption of Svcs/PTA Provider (Active with [facility]) Living arrangements for the past 2 months: Single Family Home                           HH Arranged: RN, Disease Management, PT HH Agency: [facility] Date [place]: 02/13/21 Time [place]: [place] Representative spoke with at [place]: [place]  Prior Living Arrangements/Services Living arrangements for the past 2 months: [place]   Patient language and need for interpreter reviewed:: Yes Do you feel safe going back to the place where you live?: Yes      Need for Family Participation in Patient Care: Yes (Comment) Care  giver support system in place?: Yes (comment) Current home services: DME (Patient has rolling walker, bedside commode, and shower chair. Oxygen via [employer]) Criminal Activity/Legal Involvement Pertinent to Current Situation/Hospitalization: No - Comment as needed  Activities of Daily Living      Permission Sought/Granted Permission sought to share information with : Case Manager, Customer service manager, Family Supports Permission granted to share information with : Yes, Verbal Permission Granted     Permission granted to share info w AGENCY: [facility]        Emotional Assessment Appearance:: Appears stated age Attitude/Demeanor/Rapport: Engaged Affect (typically observed): Appropriate Orientation: : Oriented to Self, Oriented to Place, Oriented to  Time, Oriented to Situation Alcohol / Substance Use: Not Applicable Psych Involvement: No (comment)  Admission diagnosis:  Atrial fibrillation with RVR (HCC) [I48.91] Acute on chronic respiratory failure with hypoxia ([place]) [J96.21] Community acquired pneumonia, unspecified laterality [J18.9] Acute on chronic congestive heart failure, unspecified heart failure type ([place]) [I50.9] Patient Active Problem List   Diagnosis Date Noted   Paroxysmal atrial fibrillation with RVR ([place]) 02/12/2021   Hypokalemia 02/12/2021   Hypomagnesemia 02/11/2021   Chronic respiratory failure with hypoxia ([place]) 02/11/2021   Acute on chronic respiratory failure with hypoxia ([place])  02/11/2021   Bilateral pleural effusion 02/11/2021   Acute on chronic diastolic CHF (congestive heart failure) ([place]) 28/41/3244   Complicated UTI (urinary tract infection) 12/25/2020   Dyspnea 12/25/2020   Dyslipidemia 12/25/2020   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   Antineoplastic chemotherapy induced pancytopenia ([place]) 12/25/2020   Esophagitis 12/25/2020  Encounter for antineoplastic chemotherapy 12/12/2020   Chemotherapy induced neutropenia ([place]) 11/14/2020    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 ([place]) 10/21/2020   Lung nodule 10/18/2020   Anxiety    AF (paroxysmal atrial fibrillation) (HCC)    Paroxysmal SVT (supraventricular tachycardia) (HCC)    Chronic pain    Hyponatremia 10/01/2020   Essential hypertension 10/01/2020   Syncope 09/30/2020   Asthmatic bronchitis 08/11/2020   Colles' fracture of right radius, initial encounter for closed fracture 11/19/2019   PCP:  [name], MD Pharmacy:   [facility] [place], [address] [place] [address] Phone: [phone] Fax: [phone]  Readmission Risk Interventions Readmission Risk Prevention Plan 02/13/2021 10/17/2020  Post Dischage Appt - Complete  Medication Screening - Complete  Transportation Screening Complete Complete  Medication Review (RN Care Manager) Complete -  PCP or Specialist appointment within 3-5 days of discharge Complete -  [place] or Home Care Consult Complete -  SW Recovery Care/Counseling Consult Complete -  Palliative Care Screening Not Applicable -  [place] Not Applicable -  Some recent data might be hidden

## 2021-02-13 NOTE — Anesthesia Preprocedure Evaluation (Signed)
Anesthesia Evaluation  Patient identified by MRN, date of birth, ID band Patient awake    Reviewed: Allergy & Precautions, NPO status , Patient's Chart, lab work & pertinent test results  History of Anesthesia Complications Negative for: history of anesthetic complications  Airway Mallampati: II  TM Distance: >3 FB Neck ROM: Full    Dental   Pulmonary asthma , former smoker,   Lung cancer    Pulmonary exam normal        Cardiovascular hypertension, Pt. on home beta blockers and Pt. on medications +CHF   Rhythm:Irregular Rate:Normal   '22 TTE - EF 60 to 65%. Grade I diastolic dysfunction (impaired relaxation). Prominent epicardial fat. Mild aortic valve sclerosis is present, with no evidence of aortic valve  stenosis.     Neuro/Psych PSYCHIATRIC DISORDERS Anxiety TIA   GI/Hepatic Neg liver ROS, GERD  Medicated,  Endo/Other  negative endocrine ROS  Renal/GU negative Renal ROS     Musculoskeletal negative musculoskeletal ROS (+)   Abdominal   Peds  Hematology  (+) Blood dyscrasia, anemia ,  Pancytopenia CBC     Component                Value               Date/Time                 WBC                      2.5 (L)             02/13/2021 0736           HGB                      11.0 (L)            02/13/2021 0736           HCT                      31.9 (L)            02/13/2021 0736           PLT                      64 (L)              02/13/2021 0736           On eliquis    Anesthesia Other Findings   Reproductive/Obstetrics                             Anesthesia Physical Anesthesia Plan  ASA: 3  Anesthesia Plan: General   Post-op Pain Management:    Induction: Intravenous  PONV Risk Score and Plan: 3 and Treatment may vary due to age or medical condition and Propofol infusion  Airway Management Planned: Mask and Natural Airway  Additional Equipment: None  Intra-op  Plan:   Post-operative Plan:   Informed Consent: I have reviewed the patients History and Physical, chart, labs and discussed the procedure including the risks, benefits and alternatives for the proposed anesthesia with the patient or authorized representative who has indicated his/her understanding and acceptance.       Plan Discussed with: CRNA and Anesthesiologist  Anesthesia Plan Comments:         Anesthesia Quick Evaluation

## 2021-02-13 NOTE — Progress Notes (Signed)
PROGRESS NOTE    Patricia [name]  UVO:[id number] DOB: [birth year] DOA: 02/11/2021 PCP: [name], MD  Brief Narrative: 70/F with limited stage small cell lung cancer-recently completed chemo, XRT, chronic diastolic CHF, paroxysmal atrial fibrillation, hypertension presented to the ED with progressive dyspnea on exertion for 2 months worse in the last 2 weeks.  Recently received blood transfusion for anemia from chemotherapy. -In the ED she was tachycardic, heart rate in the 140s, atrial arrhythmia, hypoxic to 85%, BNP 420, CT chest noted progressive bilateral pleural effusions -Improving with diuresis, started on amiodarone gtt., plan for cardioversion   Subjective: -Breathing overall improving, heart rate remains uncontrolled  Assessment and Plan: * Acute on chronic respiratory failure with hypoxia (HCC)  Acute on chronic diastolic CHF  -Likely triggered by atrial arrhythmia/A. fib  -Last echo with EF 74-25%, grade 1 diastolic dysfunction in 9/56  -Remains on IV Lasix, 4.4 L negative -Cardiology following  Paroxysmal atrial fibrillation with RVR ([place])- (present on admission) Atrial flutter - Was briefly on a Cardizem drip, subsequently switched to amiodarone gtt. -Continue p.o. 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 Julien Nordmann of oncology - completed chemotherapy treatments of cisplatin and etoposide approximately 2 weeks ago.  She has received radiation treatment of her chest and there are plans for prophylactic whole brain radiation in the near future.  Antineoplastic chemotherapy induced pancytopenia (HCC)- (present on admission) - Recent blood transfusion  -Hemoglobin stable, continue to monitor  Hypokalemia Replace  Hypomagnesemia- (present on admission) - Replace IV and  p.o.  Dyslipidemia- (present on admission) - Continue Crestor  Anxiety- (present on admission) - Continue lexapro and clonazepam   DVT prophylaxis: Eliquis Code Status: DNR Family Communication: Discussed patient in detail, no family at bedside Disposition Plan:: Likely 2 to 3 days  Consultants:  Cardiology  Procedures:   Antimicrobials:    Objective: Vitals:   02/13/21 0730 02/13/21 0805 02/13/21 0900 02/13/21 1011  BP:  (!) 129/94 116/73 131/89  Pulse:  (!) 131 (!) 132 (!) 136  Resp:  20  (!) 22  Temp:  97.7 F (36.5 C)  (!) 97.1 F (36.2 C)  TempSrc:  Oral  Temporal  SpO2: 100% 96%  100%  Weight:    84 kg  Height:        Intake/Output Summary (Last 24 hours) at 02/13/2021 1013 Last data filed at 02/12/2021 2233 Gross per 24 hour  Intake --  Output 1400 ml  Net -1400 ml   Filed Weights   02/12/21 0310 02/13/21 0608 02/13/21 1011  Weight: 84 kg 84 kg 84 kg    Examination:  General exam: Pleasant female laying in bed, AAOx3, no distress HEENT: Positive JVD CVS: S1-S2, irregular rhythm, tachycardic  Lungs: Few basilar rales, poor air movement Abdomen: Soft, nontender, bowel sounds present Extremities: No edema  Skin: No rashes Psychiatry:  Mood & affect appropriate.     Data Reviewed:   CBC: Recent Labs  Lab 02/08/21 0820 02/11/21 0724 02/12/21 0632 02/13/21 0736  WBC 3.1* 6.1 2.8* 2.5*  NEUTROABS 2.3 5.3  --   --   HGB 7.4* 10.6* 10.9* 11.0*  HCT 21.6* 31.2* 31.4* 31.9*  MCV 103.8* 98.1 96.6 97.9  PLT 41* 53* 56* 64*   Basic Metabolic Panel: Recent Labs  Lab 02/08/21 0820 02/11/21 0719 02/12/21 3875  02/13/21 0736  NA 138 137 136 137  K 4.0 4.0 3.1* 4.0  CL 99 99 91* 94*  CO2 31 31 32 33*  GLUCOSE 102* 115* 108* 114*  BUN 16 14 10 10   CREATININE 0.76 0.86 0.74 0.80  CALCIUM 8.7* 8.4* 8.5* 8.6*  MG 1.3* 1.3* 1.4* 1.8   GFR: Estimated Creatinine Clearance: 70.5 mL/min (by C-G formula based on SCr of 0.8 mg/dL). Liver Function  Tests: Recent Labs  Lab 02/08/21 0820  AST 21  ALT 23  ALKPHOS 94  BILITOT 0.4  PROT 6.2*  ALBUMIN 3.7   No results for input(s): LIPASE, AMYLASE in the last 168 hours. No results for input(s): AMMONIA in the last 168 hours. Coagulation Profile: No results for input(s): INR, PROTIME in the last 168 hours. Cardiac Enzymes: No results for input(s): CKTOTAL, CKMB, CKMBINDEX, TROPONINI in the last 168 hours. BNP (last 3 results) No results for input(s): PROBNP in the last 8760 hours. HbA1C: No results for input(s): HGBA1C in the last 72 hours. CBG: No results for input(s): GLUCAP in the last 168 hours. Lipid Profile: No results for input(s): CHOL, HDL, LDLCALC, TRIG, CHOLHDL, LDLDIRECT in the last 72 hours. Thyroid Function Tests: No results for input(s): TSH, T4TOTAL, FREET4, T3FREE, THYROIDAB in the last 72 hours. Anemia Panel: No results for input(s): VITAMINB12, FOLATE, FERRITIN, TIBC, IRON, RETICCTPCT in the last 72 hours. Urine analysis:    Component Value Date/Time   COLORURINE YELLOW 12/25/2020 1700   APPEARANCEUR CLEAR 12/25/2020 1700   LABSPEC 1.025 12/25/2020 1700   PHURINE 6.0 12/25/2020 1700   GLUCOSEU NEGATIVE 12/25/2020 1700   HGBUR NEGATIVE 12/25/2020 1700   BILIRUBINUR NEGATIVE 12/25/2020 1700   KETONESUR NEGATIVE 12/25/2020 1700   PROTEINUR NEGATIVE 12/25/2020 1700   NITRITE NEGATIVE 12/25/2020 1700   LEUKOCYTESUR NEGATIVE 12/25/2020 1700   Sepsis Labs: @LABRCNTIP (procalcitonin:4,lacticidven:4)  ) Recent Results (from the past 240 hour(s))  Resp Panel by RT-PCR (Flu A&B, Covid) Nasopharyngeal Swab     Status: None   Collection Time: 02/11/21  7:30 AM   Specimen: Nasopharyngeal Swab; Nasopharyngeal(NP) swabs in vial transport medium  Result Value Ref Range Status   SARS Coronavirus 2 by RT PCR NEGATIVE NEGATIVE Final    Comment: (NOTE) SARS-CoV-2 target nucleic acids are NOT DETECTED.  The SARS-CoV-2 RNA is generally detectable in upper  respiratory specimens during the acute phase of infection. The lowest concentration of SARS-CoV-2 viral copies this assay can detect is 138 copies/mL. A negative result does not preclude SARS-Cov-2 infection and should not be used as the sole basis for treatment or other patient management decisions. A negative result may occur with  improper specimen collection/handling, submission of specimen other than nasopharyngeal swab, presence of viral mutation(s) within the areas targeted by this assay, and inadequate number of viral copies(<138 copies/mL). A negative result must be combined with clinical observations, patient history, and epidemiological information. The expected result is Negative.  Fact Sheet for Patients:  EntrepreneurPulse.com.au  Fact Sheet for Healthcare Providers:  IncredibleEmployment.be  This test is no t yet approved or cleared by the [place] FDA and  has been authorized for detection and/or diagnosis of SARS-CoV-2 by FDA under an Emergency Use Authorization (EUA). This EUA will remain  in effect (meaning this test can be used) for the duration of the COVID-19 declaration under Section 564(b)(1) of the Act, 21 U.S.C.section 360bbb-3(b)(1), unless the authorization is terminated  or revoked sooner.       Influenza A by PCR NEGATIVE NEGATIVE  Final   Influenza B by PCR NEGATIVE NEGATIVE Final    Comment: (NOTE) The Xpert Xpress SARS-CoV-2/FLU/RSV plus assay is intended as an aid in the diagnosis of influenza from Nasopharyngeal swab specimens and should not be used as a sole basis for treatment. Nasal washings and aspirates are unacceptable for Xpert Xpress SARS-CoV-2/FLU/RSV testing.  Fact Sheet for Patients: EntrepreneurPulse.com.au  Fact Sheet for Healthcare Providers: IncredibleEmployment.be  This test is not yet approved or cleared by the [place] FDA and has been  authorized for detection and/or diagnosis of SARS-CoV-2 by FDA under an Emergency Use Authorization (EUA). This EUA will remain in effect (meaning this test can be used) for the duration of the COVID-19 declaration under Section 564(b)(1) of the Act, 21 U.S.C. section 360bbb-3(b)(1), unless the authorization is terminated or revoked.  Performed at [facility], [place] [address], [place], [place] [address]   Culture, blood (routine x 2)     Status: None (Preliminary result)   Collection Time: 02/11/21  5:04 PM   Specimen: BLOOD RIGHT HAND  Result Value Ref Range Status   Specimen Description BLOOD RIGHT HAND  Final   Special Requests   Final    BOTTLES DRAWN AEROBIC AND ANAEROBIC Blood Culture adequate volume   Culture   Final    NO GROWTH 2 DAYS Performed at [facility], [place] [address], [place], [place] [address]    Report Status PENDING  Incomplete  Culture, blood (routine x 2)     Status: None (Preliminary result)   Collection Time: 02/11/21  5:15 PM   Specimen: BLOOD LEFT HAND  Result Value Ref Range Status   Specimen Description BLOOD LEFT HAND  Final   Special Requests   Final    BOTTLES DRAWN AEROBIC AND ANAEROBIC Blood Culture adequate volume   Culture   Final    NO GROWTH 2 DAYS Performed at [facility], [place] [address] [place], [place] [address]    Report Status PENDING  Incomplete     Radiology Studies: No results found.   Scheduled Meds:  [MAR Hold] apixaban  5 mg Oral BID   [MAR Hold] carisoprodol  350 mg Oral TID   [MAR Hold] clonazePAM  1 mg Oral QHS   [MAR Hold] escitalopram  10 mg Oral Daily   [MAR Hold] furosemide  40 mg Intravenous BID   [MAR Hold] gabapentin  600 mg Oral BID   [MAR Hold] ipratropium  0.5 mg Nebulization TID   [MAR Hold] linaclotide  145 mcg Oral q1600   [MAR Hold] magnesium oxide  400 mg Oral Daily   [MAR Hold] metoprolol tartrate  75 mg Oral BID   [MAR Hold] pantoprazole  40 mg Oral Daily   [MAR Hold]  polyethylene glycol  34 g Oral q AM   [MAR Hold] polyvinyl alcohol  1 drop Both Eyes QHS   [MAR Hold] potassium chloride  40 mEq Oral BID   [MAR Hold] rosuvastatin  5 mg Oral Once per day on Mon Wed Fri   [facility] Hold] sodium chloride flush  3 mL Intravenous Q12H   [MAR Hold] sodium chloride  1 g Oral TID   Continuous Infusions:  amiodarone 30 mg/hr (02/13/21 0934)     LOS: 2 days    Time spent: 72min    Domenic Polite, MD [facility]   02/13/2021, 10:13 AM

## 2021-02-13 NOTE — Progress Notes (Signed)
° °  Pt now post DCCV having episodes of tachycardia at 126 with 1st degree, though could be a flutter, atrial tach.  She is unaware of tachycardiac, but she always has been.  She does break to SR at slower rate.   Will bolus with 150 mg amiodarone and increase drip to 60 for now.  Dr. Stanford Breed aware and I discussed with pt.  Cecilie Kicks, FNP-C [facility]  [phone] or after 5pm and on weekends call [phone] 02/13/2021.

## 2021-02-13 NOTE — Transfer of Care (Signed)
Immediate Anesthesia Transfer of Care Note  Patient: Patricia [name]  Procedure(s) Performed: CARDIOVERSION  Patient Location: Endoscopy Unit  Anesthesia Type:General  Level of Consciousness: awake and alert   Airway & Oxygen Therapy: Patient Spontanous Breathing and Patient connected to nasal cannula oxygen  Post-op Assessment: Report given to RN and Post -op Vital signs reviewed and stable  Post vital signs: Reviewed and stable  Last Vitals:  Vitals Value Taken Time  BP 97/54   Temp    Pulse 74   Resp 16   SpO2 100     Last Pain:  Vitals:   02/13/21 1011  TempSrc: Temporal  PainSc: 0-No pain      Patients Stated Pain Goal: 0 (17/35/67 0141)  Complications: No notable events documented.

## 2021-02-13 NOTE — CV Procedure (Signed)
° °  Electrical Cardioversion Procedure Note Patricia [name] [id number] [birth year]  Procedure: Electrical Cardioversion Indications:  Atrial Flutter  Time Out: Verified patient identification, verified procedure,medications/allergies/relevent history reviewed, required imaging and test results available.  Performed  Procedure Details  The patient was NPO after midnight. Anesthesia was administered at the beside  by [facility] with 60mg  of lidocaine and 80 mg propofol.  Cardioversion was done with synchronized biphasic defibrillation with AP pads with 200 Joules.  The patient converted to normal sinus rhythm. The patient tolerated the procedure well   IMPRESSION:  Successful cardioversion of atrial flutter    Teasia Zapf A Amilio Zehnder 02/13/2021, 11:06 AM

## 2021-02-13 NOTE — Progress Notes (Signed)
PT Cancellation Note  Patient Details Name: Ardys Hataway MRN: [id number] DOB: 20-Apr-1950   Cancelled Treatment:    Reason Eval/Treat Not Completed: Patient not medically ready (pt currently with sustained tachycardia 135 at rest and await dCCV)   Rital Cavey B Leolia Vinzant 02/13/2021, 7:35 AM [place] Pager: [phone] Office: [phone]

## 2021-02-13 NOTE — Progress Notes (Addendum)
Progress Note  Patient Name: Patricia [name] Date of Encounter: 02/13/2021  [facility] Cardiologist: Will Meredith Leeds, MD   Subjective   No CP; dyspnea improving.  Inpatient Medications    Scheduled Meds:  apixaban  5 mg Oral BID   carisoprodol  350 mg Oral TID   clonazePAM  1 mg Oral QHS   escitalopram  10 mg Oral Daily   furosemide  40 mg Intravenous BID   gabapentin  600 mg Oral BID   ipratropium  0.5 mg Nebulization TID   linaclotide  145 mcg Oral q1600   magnesium oxide  400 mg Oral Daily   metoprolol tartrate  75 mg Oral BID   pantoprazole  40 mg Oral Daily   polyethylene glycol  34 g Oral q AM   polyvinyl alcohol  1 drop Both Eyes QHS   potassium chloride  40 mEq Oral BID   rosuvastatin  5 mg Oral Once per day on Mon Wed Fri   sodium chloride flush  3 mL Intravenous Q12H   sodium chloride  1 g Oral TID   Continuous Infusions:  amiodarone 30 mg/hr (02/12/21 2213)   PRN Meds: acetaminophen **OR** acetaminophen, docusate sodium, fluticasone, guaiFENesin-dextromethorphan, levalbuterol, magic mouthwash, nystatin, ondansetron **OR** ondansetron (ZOFRAN) IV, polyvinyl alcohol, traMADol, zolpidem   Vital Signs    Vitals:   02/12/21 2256 02/13/21 0608 02/13/21 0730 02/13/21 0805  BP: 117/80 126/89  (!) 129/94  Pulse: (!) 136 (!) 127  (!) 131  Resp: 20 17  20   Temp: 98 F (36.7 C) 98.5 F (36.9 C)  97.7 F (36.5 C)  TempSrc:  Oral  Oral  SpO2:  94% 100% 96%  Weight:  84 kg    Height:        Intake/Output Summary (Last 24 hours) at 02/13/2021 0842 Last data filed at 02/12/2021 2233 Gross per 24 hour  Intake --  Output 1800 ml  Net -1800 ml   Last 3 Weights 02/13/2021 02/12/2021 02/11/2021  Weight (lbs) 185 lb 3 oz 185 lb 3 oz 186 lb 8.2 oz  Weight (kg) 84 kg 84 kg 84.6 kg      Telemetry    Atrial flutter - Personally Reviewed  Physical Exam   GEN: No acute distress.   Neck: No JVD Cardiac: Irregular and tachycardic Respiratory: Clear to  auscultation bilaterally. GI: Soft, nontender, non-distended  MS: No edema Neuro:  Nonfocal  Psych: Normal affect   Labs    High Sensitivity Troponin:   Recent Labs  Lab 02/11/21 0719 02/11/21 0925  TROPONINIHS 12 10     Chemistry Recent Labs  Lab 02/08/21 0820 02/11/21 0719 02/12/21 0632 02/13/21 0736  NA 138 137 136 137  K 4.0 4.0 3.1* 4.0  CL 99 99 91* 94*  CO2 31 31 32 33*  GLUCOSE 102* 115* 108* 114*  BUN 16 14 10 10   CREATININE 0.76 0.86 0.74 0.80  CALCIUM 8.7* 8.4* 8.5* 8.6*  MG 1.3* 1.3* 1.4* 1.8  PROT 6.2*  --   --   --   ALBUMIN 3.7  --   --   --   AST 21  --   --   --   ALT 23  --   --   --   ALKPHOS 94  --   --   --   BILITOT 0.4  --   --   --   GFRNONAA >60 >60 >60 >60  ANIONGAP 8 7 13  10  Hematology Recent Labs  Lab 02/11/21 0724 02/12/21 0632 02/13/21 0736  WBC 6.1 2.8* 2.5*  RBC 3.18* 3.25* 3.26*  HGB 10.6* 10.9* 11.0*  HCT 31.2* 31.4* 31.9*  MCV 98.1 96.6 97.9  MCH 33.3 33.5 33.7  MCHC 34.0 34.7 34.5  RDW 23.8* 23.2* 22.9*  PLT 53* 56* 64*    BNP Recent Labs  Lab 02/11/21 0724  BNP 420.9*     Radiology    CT Angio Chest PE W and/or Wo Contrast  Result Date: 02/11/2021 CLINICAL DATA:  71 year old female with lung cancer. Shortness of breath. Blood transfusion yesterday. Atrial fibrillation with RVR. EXAM: CT ANGIOGRAPHY CHEST WITH CONTRAST TECHNIQUE: Multidetector CT imaging of the chest was performed using the standard protocol during bolus administration of intravenous contrast. Multiplanar CT image reconstructions and MIPs were obtained to evaluate the vascular anatomy. RADIATION DOSE REDUCTION: This exam was performed according to the departmental dose-optimization program which includes automated exposure control, adjustment of the mA and/or kV according to patient size and/or use of iterative reconstruction technique. CONTRAST:  185mL OMNIPAQUE IOHEXOL 350 MG/ML SOLN COMPARISON:  Chest CT without contrast 12/27/2020. PET-CT  11/04/2020 FINDINGS: Cardiovascular: Good contrast bolus timing in the pulmonary arterial tree. Mild respiratory motion. No focal filling defect identified in the pulmonary arteries to suggest acute pulmonary embolism. Stable cardiac size, within normal limits. No pericardial effusion. Little aortic contrast. Aortic and coronary artery calcified atherosclerosis. Mediastinum/Nodes: No discrete mediastinal mass or lymphadenopathy. Lungs/Pleura: Moderate bilateral layering pleural effusions have mildly progressed since December. Cannot exclude complex fluid density, exudate. Compressive, enhancing atelectasis in both lungs. Superimposed atelectatic changes to the central airways which remain patent. Underlying centrilobular emphysema most apparent in the upper lobes. New compared to December curvilinear and masslike opacity in the lingula, but seems to be enhancing similar to the other atelectatic lung (series 8, image 68. Bronchopneumonia felt less likely. Primary left infrahilar tumor not well evaluated due to presence of effusions. Upper Abdomen: Visible liver, gallbladder, spleen, pancreas, adrenal glands and kidneys appear stable since December. Negative visible bowel. No free air or free fluid in the visible upper abdomen. Musculoskeletal: Previously augmented upper lumbar compression fracture. Osteopenia. No acute or suspicious osseous lesion identified. Review of the MIP images confirms the above findings. IMPRESSION: 1. No evidence of acute pulmonary embolus. 2. Progressed since December and now moderate bilateral layering pleural effusions, cannot exclude exudative fluid. Associated compressive atelectasis, and probably new round atelectasis in the lingula, with Bronchopneumonia felt less likely. Known primary left lower lobe tumor not well evaluated due to presence of effusions. 3. Aortic Atherosclerosis (ICD10-I70.0) and Emphysema (ICD10-J43.9). Electronically Signed   By: Genevie Ann M.D.   On: 02/11/2021  09:46     Patient Profile     71 y.o. female with past medical history of small cell lung cancer, chronic diastolic congestive heart failure paroxysmal atrial fibrillation admitted with atrial flutter.  Assessment & Plan    1 atrial flutter-patient remains in atrial flutter today.  We will continue amiodarone and metoprolol at present dose.  She has been on uninterrupted Eliquis since late December.  We will try to arrange elective cardioversion today.  Follow-up limited echo is pending.  2 acute on chronic diastolic ingestive heart failure-volume status has improved.  We will continue Lasix at present dose.  Follow renal function.  3 small cell lung cancer-treatment associated with neutropenia and thrombocytopenia; managed by oncology.  For questions or updates, please contact [place] Please consult [email or web address] for contact info under  Signed, Kirk Ruths, MD  02/13/2021, 8:42 AM

## 2021-02-13 NOTE — Anesthesia Postprocedure Evaluation (Signed)
Anesthesia Post Note  Patient: Patricia [name]  Procedure(s) Performed: CARDIOVERSION     Patient location during evaluation: PACU Anesthesia Type: General Level of consciousness: awake and alert Pain management: pain level controlled Vital Signs Assessment: post-procedure vital signs reviewed and stable Respiratory status: spontaneous breathing, nonlabored ventilation, respiratory function stable and patient connected to nasal cannula oxygen Cardiovascular status: blood pressure returned to baseline and stable Postop Assessment: no apparent nausea or vomiting Anesthetic complications: no   No notable events documented.  Last Vitals:  Vitals:   02/13/21 1130 02/13/21 1136  BP: 109/61 104/60  Pulse: 72 72  Resp: 19 20  Temp:    SpO2: 100% 100%    Last Pain:  Vitals:   02/13/21 1120  TempSrc: Temporal  PainSc:                  [name]

## 2021-02-13 NOTE — Progress Notes (Signed)
Dorene Ar, NP paged regarding amio gtt @ 60mg /h. Per call back from [place], [place] leave at 60mg /h throughout night & cardiology will re-evaluate in AM.

## 2021-02-14 ENCOUNTER — Inpatient Hospital Stay (HOSPITAL_COMMUNITY): Payer: Medicare HMO

## 2021-02-14 DIAGNOSIS — I4892 Unspecified atrial flutter: Secondary | ICD-10-CM | POA: Diagnosis present

## 2021-02-14 DIAGNOSIS — I5033 Acute on chronic diastolic (congestive) heart failure: Secondary | ICD-10-CM | POA: Diagnosis not present

## 2021-02-14 LAB — BASIC METABOLIC PANEL
Anion gap: 11 (ref 5–15)
BUN: 12 mg/dL (ref 8–23)
CO2: 34 mmol/L — ABNORMAL HIGH (ref 22–32)
Calcium: 9.2 mg/dL (ref 8.9–10.3)
Chloride: 92 mmol/L — ABNORMAL LOW (ref 98–111)
Creatinine, Ser: 0.9 mg/dL (ref 0.44–1.00)
GFR, Estimated: >60 mL/min (ref >60)
Glucose, Bld: 117 mg/dL — ABNORMAL HIGH (ref 70–99)
Potassium: 3.9 mmol/L (ref 3.5–5.1)
Sodium: 137 mmol/L (ref 135–145)

## 2021-02-14 MED ORDER — GADOBUTROL 1 MMOL/ML IV SOLN
8.0000 mL | Freq: Once | INTRAVENOUS | Status: AC | PRN
  Administered 2021-02-14: 8 mL via INTRAVENOUS

## 2021-02-14 MED ORDER — POTASSIUM CHLORIDE CRYS ER 20 MEQ PO TBCR
20.0000 meq | EXTENDED_RELEASE_TABLET | Freq: Every day | ORAL | Status: DC
  Administered 2021-02-14 – 2021-02-17 (×4): 20 meq via ORAL
  Filled 2021-02-14 (×4): qty 1

## 2021-02-14 MED ORDER — FUROSEMIDE 40 MG PO TABS
40.0000 mg | ORAL_TABLET | Freq: Every day | ORAL | Status: DC
  Administered 2021-02-14 – 2021-02-17 (×4): 40 mg via ORAL
  Filled 2021-02-14 (×4): qty 1

## 2021-02-14 MED ORDER — AMIODARONE HCL 200 MG PO TABS
400.0000 mg | ORAL_TABLET | Freq: Two times a day (BID) | ORAL | Status: DC
  Administered 2021-02-14 – 2021-02-21 (×15): 400 mg via ORAL
  Filled 2021-02-14 (×15): qty 2

## 2021-02-14 NOTE — Progress Notes (Signed)
PROGRESS NOTE    Patricia [name]  HLK:[id number] DOB: 06-05-1950 DOA: 02/11/2021 PCP: [name], MD  Brief Narrative: 71/F with limited stage small cell lung cancer-recently completed chemo, XRT, chronic diastolic CHF, paroxysmal atrial fibrillation, hypertension presented to the ED with progressive dyspnea on exertion for 2 months worse in the last 2 weeks.  Recently received blood transfusion for anemia from chemotherapy. -In the ED she was in A[place]  heart rate in the 140s,  hypoxic to 85%, BNP 420, CT chest noted progressive bilateral pleural effusions -Improving with diuresis, started on amiodarone gtt., s/p  cardioversion   Subjective: -Had issues with sinus tach post cardioversion last night, now better, breathing improving, still with some dyspnea  Assessment and Plan: * Acute on chronic respiratory failure with hypoxia (HCC)  Acute on chronic diastolic CHF  -Likely triggered by atrial arrhythmia/A. fib  -Last echo with EF 73-42%, grade 1 diastolic dysfunction in 8/76  -Diuresed with IV Lasix she is 4.6 L negative, close to euvolemic -Cardiology following, transitioning to oral diuretics today -Discharge planning  Atrial flutter with rapid ventricular response ([place]) - Was briefly on a Cardizem drip, subsequently switched to amiodarone gtt. -Continue p.o. metoprolol -Continue Eliquis -Underwent cardioversion yesterday, in sinus rhythm now, overnight with sinus tachycardia -Starting p.o. 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 Julien Nordmann of oncology - completed chemotherapy treatments of cisplatin and etoposide approximately 2 weeks ago.,  Also completed lung radiation, plans for prophylactic whole brain radiation in the near future.  Antineoplastic chemotherapy induced pancytopenia (HCC)- (present on admission) - Recent blood transfusion  -Hemoglobin  stable, continue to monitor  Hypokalemia Replace  Hypomagnesemia- (present on admission) - Replace IV and p.o.  Dyslipidemia- (present on admission) - Continue Crestor  Anxiety- (present on admission) - Continue lexapro and clonazepam   DVT prophylaxis: Eliquis Code Status: DNR Family Communication: Discussed patient in detail, no family at bedside Disposition Plan:: Home in 1 to 2 days  Consultants:  Cardiology  Procedures:   Antimicrobials:    Objective: Vitals:   02/13/21 2358 02/14/21 0505 02/14/21 0735 02/14/21 0737  BP: 113/81 115/86    Pulse: (!) 109 (!) 108 78   Resp: 19 (!) 21    Temp: 97.9 F (36.6 C) 98 F (36.7 C)    TempSrc: Oral Oral    SpO2: 98% 96% 92% 95%  Weight:  82.8 kg    Height:        Intake/Output Summary (Last 24 hours) at 02/14/2021 1249 Last data filed at 02/13/2021 2359 Gross per 24 hour  Intake 779.14 ml  Output 600 ml  Net 179.14 ml   Filed Weights   02/13/21 0608 02/13/21 1011 02/14/21 0505  Weight: 84 kg 84 kg 82.8 kg    Examination:  General exam: Pleasant female sitting up in bed, AAOx3, no distress HEENT: No JVD CVS: S1-S2, regular rhythm Lungs: Improved air movement, decreased breath sounds to bases Abdomen: Soft, nontender, bowel sounds present Extremities: No edema Skin: No rashes Psychiatry:  Mood & affect appropriate.     Data Reviewed:   CBC: Recent Labs  Lab 02/08/21 0820 02/11/21 0724 02/12/21 0632 02/13/21 0736  WBC 3.1* 6.1 2.8* 2.5*  NEUTROABS 2.3 5.3  --   --   HGB 7.4* 10.6* 10.9* 11.0*  HCT 21.6* 31.2* 31.4* 31.9*  MCV 103.8* 98.1 96.6 97.9  PLT 41* 53*  56* 64*   Basic Metabolic Panel: Recent Labs  Lab 02/08/21 0820 02/11/21 0719 02/12/21 0632 02/13/21 0736 02/14/21 0817  NA 138 137 136 137 137  K 4.0 4.0 3.1* 4.0 3.9  CL 99 99 91* 94* 92*  CO2 31 31 32 33* 34*  GLUCOSE 102* 115* 108* 114* 117*  BUN 16 14 10 10 12   CREATININE 0.76 0.86 0.74 0.80 0.90  CALCIUM 8.7* 8.4* 8.5*  8.6* 9.2  MG 1.3* 1.3* 1.4* 1.8  --    GFR: Estimated Creatinine Clearance: 62.2 mL/min (by C-G formula based on SCr of 0.9 mg/dL). Liver Function Tests: Recent Labs  Lab 02/08/21 0820  AST 21  ALT 23  ALKPHOS 94  BILITOT 0.4  PROT 6.2*  ALBUMIN 3.7   No results for input(s): LIPASE, AMYLASE in the last 168 hours. No results for input(s): AMMONIA in the last 168 hours. Coagulation Profile: Recent Labs  Lab 02/13/21 1222  INR 1.4*   Cardiac Enzymes: No results for input(s): CKTOTAL, CKMB, CKMBINDEX, TROPONINI in the last 168 hours. BNP (last 3 results) No results for input(s): PROBNP in the last 8760 hours. HbA1C: No results for input(s): HGBA1C in the last 72 hours. CBG: No results for input(s): GLUCAP in the last 168 hours. Lipid Profile: No results for input(s): CHOL, HDL, LDLCALC, TRIG, CHOLHDL, LDLDIRECT in the last 72 hours. Thyroid Function Tests: No results for input(s): TSH, T4TOTAL, FREET4, T3FREE, THYROIDAB in the last 72 hours. Anemia Panel: No results for input(s): VITAMINB12, FOLATE, FERRITIN, TIBC, IRON, RETICCTPCT in the last 72 hours. Urine analysis:    Component Value Date/Time   COLORURINE YELLOW 12/25/2020 1700   APPEARANCEUR CLEAR 12/25/2020 1700   LABSPEC 1.025 12/25/2020 1700   PHURINE 6.0 12/25/2020 1700   GLUCOSEU NEGATIVE 12/25/2020 1700   HGBUR NEGATIVE 12/25/2020 1700   BILIRUBINUR NEGATIVE 12/25/2020 1700   KETONESUR NEGATIVE 12/25/2020 1700   PROTEINUR NEGATIVE 12/25/2020 1700   NITRITE NEGATIVE 12/25/2020 1700   LEUKOCYTESUR NEGATIVE 12/25/2020 1700   Sepsis Labs: @LABRCNTIP (procalcitonin:4,lacticidven:4)  ) Recent Results (from the past 240 hour(s))  Resp Panel by RT-PCR (Flu A&B, Covid) Nasopharyngeal Swab     Status: None   Collection Time: 02/11/21  7:30 AM   Specimen: Nasopharyngeal Swab; Nasopharyngeal(NP) swabs in vial transport medium  Result Value Ref Range Status   SARS Coronavirus 2 by RT PCR NEGATIVE NEGATIVE  Final    Comment: (NOTE) SARS-CoV-2 target nucleic acids are NOT DETECTED.  The SARS-CoV-2 RNA is generally detectable in upper respiratory specimens during the acute phase of infection. The lowest concentration of SARS-CoV-2 viral copies this assay can detect is 138 copies/mL. A negative result does not preclude SARS-Cov-2 infection and should not be used as the sole basis for treatment or other patient management decisions. A negative result may occur with  improper specimen collection/handling, submission of specimen other than nasopharyngeal swab, presence of viral mutation(s) within the areas targeted by this assay, and inadequate number of viral copies(<138 copies/mL). A negative result must be combined with clinical observations, patient history, and epidemiological information. The expected result is Negative.  Fact Sheet for Patients:  EntrepreneurPulse.com.au  Fact Sheet for Healthcare Providers:  IncredibleEmployment.be  This test is no t yet approved or cleared by the [place] FDA and  has been authorized for detection and/or diagnosis of SARS-CoV-2 by FDA under an Emergency Use Authorization (EUA). This EUA will remain  in effect (meaning this test can be used) for the duration of the COVID-19 declaration  under Section 564(b)(1) of the Act, 21 U.S.C.section 360bbb-3(b)(1), unless the authorization is terminated  or revoked sooner.       Influenza A by PCR NEGATIVE NEGATIVE Final   Influenza B by PCR NEGATIVE NEGATIVE Final    Comment: (NOTE) The Xpert Xpress SARS-CoV-2/FLU/RSV plus assay is intended as an aid in the diagnosis of influenza from Nasopharyngeal swab specimens and should not be used as a sole basis for treatment. Nasal washings and aspirates are unacceptable for Xpert Xpress SARS-CoV-2/FLU/RSV testing.  Fact Sheet for Patients: EntrepreneurPulse.com.au  Fact Sheet for Healthcare  Providers: IncredibleEmployment.be  This test is not yet approved or cleared by the [place] FDA and has been authorized for detection and/or diagnosis of SARS-CoV-2 by FDA under an Emergency Use Authorization (EUA). This EUA will remain in effect (meaning this test can be used) for the duration of the COVID-19 declaration under Section 564(b)(1) of the Act, 21 U.S.C. section 360bbb-3(b)(1), unless the authorization is terminated or revoked.  Performed at [facility], [place] [address] [place], [place] [address]   Culture, blood (routine x 2)     Status: None (Preliminary result)   Collection Time: 02/11/21  5:04 PM   Specimen: BLOOD RIGHT HAND  Result Value Ref Range Status   Specimen Description BLOOD RIGHT HAND  Final   Special Requests   Final    BOTTLES DRAWN AEROBIC AND ANAEROBIC Blood Culture adequate volume   Culture   Final    NO GROWTH 3 DAYS Performed at [facility], [place] [address] [place], [place] [address]    Report Status PENDING  Incomplete  Culture, blood (routine x 2)     Status: None (Preliminary result)   Collection Time: 02/11/21  5:15 PM   Specimen: BLOOD LEFT HAND  Result Value Ref Range Status   Specimen Description BLOOD LEFT HAND  Final   Special Requests   Final    BOTTLES DRAWN AEROBIC AND ANAEROBIC Blood Culture adequate volume   Culture   Final    NO GROWTH 3 DAYS Performed at [facility], [place] [address], [place], [place] [address]    Report Status PENDING  Incomplete     Radiology Studies: ECHOCARDIOGRAM LIMITED  Result Date: 02/13/2021    ECHOCARDIOGRAM LIMITED REPORT   Patient Name:   Patricia [name] Date of Exam: 02/13/2021 Medical Rec #:  [id number]          Height:       66.0 in Accession #:    [id number]         Weight:       185.2 lb Date of Birth:  October 21, 1950           BSA:          1.935 m Patient Age:    65 years           BP:           117/80 mmHg Patient Gender: F                  HR:            120 bpm. Exam Location:  Inpatient Procedure: 2D Echo, Limited Echo, Cardiac Doppler and Color Doppler Indications:    CHF  History:        Patient has prior history of Echocardiogram examinations.                 Arrythmias:Atrial Fibrillation; Risk Factors:Hypertension and  Dyslipidemia.  Sonographer:    Jyl Heinz Referring Phys: Cel[place]  1. Left ventricular ejection fraction, by estimation, is 45 to 50%. The left ventricle has mildly decreased function. The left ventricle demonstrates global hypokinesis.  2. The right ventricular size is mildly enlarged. There is mildly elevated pulmonary artery systolic pressure. The estimated right ventricular systolic pressure is 16.1 mmHg.  3. Right atrial size was mildly dilated.  4. Moderate pleural effusion in the left lateral region.  5. Tricuspid valve regurgitation is moderate.  6. The inferior vena cava is dilated in size with <50% respiratory variability, suggesting right atrial pressure of 15 mmHg. Comparison(s): Prior images reviewed side by side. The left ventricular function is worsened. FINDINGS  Left Ventricle: Left ventricular ejection fraction, by estimation, is 45 to 50%. The left ventricle has mildly decreased function. The left ventricle demonstrates global hypokinesis. Right Ventricle: The right ventricular size is mildly enlarged. There is mildly elevated pulmonary artery systolic pressure. The tricuspid regurgitant velocity is 2.51 m/s, and with an assumed right atrial pressure of 15 mmHg, the estimated right ventricular systolic pressure is 09.6 mmHg. Left Atrium: Left atrial size was normal in size. Right Atrium: Right atrial size was mildly dilated. Tricuspid Valve: The tricuspid valve is normal in structure. Tricuspid valve regurgitation is moderate. Aortic Valve: Aortic valve peak gradient measures 3.4 mmHg. Pulmonic Valve: Pulmonic valve regurgitation is trivial. Venous: The inferior vena cava is dilated  in size with less than 50% respiratory variability, suggesting right atrial pressure of 15 mmHg. Additional Comments: There is a moderate pleural effusion in the left lateral region. LEFT VENTRICLE PLAX 2D LVIDd:         4.20 cm     Diastology LVIDs:         3.10 cm     LV e' medial:    9.36 cm/s LV PW:         0.90 cm     LV E/e' medial:  8.4 LV IVS:        1.00 cm     LV e' lateral:   11.10 cm/s LVOT diam:     2.00 cm     LV E/e' lateral: 7.1 LV SV:         37 LV SV Index:   19 LVOT Area:     3.14 cm  LV Volumes (MOD) LV vol d, MOD A2C: 61.2 ml LV vol d, MOD A4C: 56.5 ml LV vol s, MOD A2C: 25.5 ml LV vol s, MOD A4C: 24.2 ml LV SV MOD A2C:     35.7 ml LV SV MOD A4C:     56.5 ml LV SV MOD BP:      34.0 ml RIGHT VENTRICLE            IVC RV S prime:     9.36 cm/s  IVC diam: 2.80 cm TAPSE (M-mode): 1.4 cm LEFT ATRIUM         Index LA diam:    3.10 cm 1.60 cm/m  AORTIC VALVE AV Area (Vmax): 2.60 cm AV Vmax:        91.70 cm/s AV Peak Grad:   3.4 mmHg LVOT Vmax:      76.00 cm/s LVOT Vmean:     54.700 cm/s LVOT VTI:       0.119 m  AORTA Ao Root diam: 3.30 cm Ao Asc diam:  3.10 cm MITRAL VALVE  TRICUSPID VALVE MV Area (PHT): 6.54 cm    TR Peak grad:   25.2 mmHg MV Decel Time: 116 msec    TR Vmax:        251.00 cm/s MV E velocity: 78.40 cm/s                            SHUNTS                            Systemic VTI:  0.12 m                            Systemic Diam: 2.00 cm Candee Furbish MD Electronically signed by Candee Furbish MD Signature Date/Time: 02/13/2021/3:15:15 PM    Final      Scheduled Meds:  amiodarone  400 mg Oral BID   apixaban  5 mg Oral BID   carisoprodol  350 mg Oral TID   clonazePAM  1 mg Oral QHS   escitalopram  10 mg Oral Daily   furosemide  40 mg Oral Daily   gabapentin  600 mg Oral BID   ipratropium  0.5 mg Nebulization TID   linaclotide  145 mcg Oral q1600   magnesium oxide  400 mg Oral Daily   metoprolol tartrate  75 mg Oral BID   pantoprazole  40 mg Oral Daily   polyethylene  glycol  34 g Oral q AM   polyvinyl alcohol  1 drop Both Eyes QHS   potassium chloride  20 mEq Oral Daily   rosuvastatin  5 mg Oral Once per day on Mon Wed Fri   sodium chloride flush  3 mL Intravenous Q12H   sodium chloride  1 g Oral TID   Continuous Infusions:  sodium chloride 20 mL/hr at 02/13/21 1255     LOS: 3 days    Time spent: 78min    Domenic Polite, MD [facility]   02/14/2021, 12:49 PM

## 2021-02-14 NOTE — Evaluation (Signed)
Physical Therapy Evaluation Patient Details Name: Patricia [name] MRN: [id number] DOB: 12-12-1950 Today's Date: 02/14/2021  History of Present Illness  71 y.o. female admitted 1/24 with DOE and tachycardia. Pt with acute on chronic respiratory failure and CHF. 2/6 DCCV. PMHx: HTN, back surgery, ORIF radius and ulna fx, Lung CA with thrombocytopenia, Afib.  Clinical Impression  Pt very pleasant and reports having weakness and O2 dependence since lung CA dx. Pt with assist of spouse at home for driving and housework. She enjoys lake fishing and hasn't been out of the house much to prevent infection. Pt with decreased strength, activity tolerance and cardiopulmonary function who will benefit from acute therapy to maximize mobility, safety and independence. Pt currently receiving HHPT and recommend resume services.   SPO2 84% on RA with removal of O2, on 2L sats 90-94% with activity HR initially 112 at rest up to 128 with gait and return to 78 NSR end of session     Recommendations for follow up therapy are one component of a multi-disciplinary discharge planning process, led by the attending physician.  Recommendations may be updated based on patient status, additional functional criteria and insurance authorization.  Follow Up Recommendations Home health PT    Assistance Recommended at Discharge PRN  Patient can return home with the following  Assist for transportation;Assistance with cooking/housework    Equipment Recommendations None recommended by PT  Recommendations for Other Services       Functional Status Assessment Patient has had a recent decline in their functional status and demonstrates the ability to make significant improvements in function in a reasonable and predictable amount of time.     Precautions / Restrictions Precautions Precautions: Fall Precaution Comments: watch sats and HR      Mobility  Bed Mobility Overal bed mobility: Modified Independent              General bed mobility comments: increased time    Transfers Overall transfer level: Needs assistance   Transfers: Sit to/from Stand Sit to Stand: Supervision           General transfer comment: cues for hand placement not to pull up on RW    Ambulation/Gait Ambulation/Gait assistance: Supervision Gait Distance (Feet): 90 Feet Assistive device: Rolling walker (2 wheels) Gait Pattern/deviations: Step-through pattern, Trunk flexed   Gait velocity interpretation: 1.31 - 2.62 ft/sec, indicative of limited community ambulator   General Gait Details: cues for posture and position in RW with pt able to self regulate distance  Stairs            Wheelchair Mobility    Modified Rankin (Stroke Patients Only)       Balance Overall balance assessment: Mild deficits observed, not formally tested                                           Pertinent Vitals/Pain Pain Assessment Pain Assessment: No/denies pain    Home Living Family/patient expects to be discharged to:: Private residence Living Arrangements: Spouse/significant other Available Help at Discharge: Available 24 hours/day Type of Home: House Home Access: Stairs to enter Entrance Stairs-Rails: Psychiatric nurse of Steps: 2   Home Layout: One level Home Equipment: International aid/development worker (2 wheels);Hand held shower head;Adaptive equipment Additional Comments: Motorized recliner without lift function. Has gait belt    Prior Function Prior Level of Function : Independent/Modified Independent  Mobility Comments: walking with walker at home ADLs Comments: spouse has been doing the grocery shopping, cooking, cleaning since her CA dx. Spouse drives since CA tx     Hand Dominance        Extremity/Trunk Assessment   Upper Extremity Assessment Upper Extremity Assessment: Generalized weakness    Lower Extremity Assessment Lower Extremity  Assessment: Generalized weakness    Cervical / Trunk Assessment Cervical / Trunk Assessment: Normal  Communication   Communication: No difficulties  Cognition Arousal/Alertness: Awake/alert Behavior During Therapy: WFL for tasks assessed/performed Overall Cognitive Status: Within Functional Limits for tasks assessed                                          General Comments      Exercises     Assessment/Plan    PT Assessment Patient needs continued PT services  PT Problem List Decreased mobility;Decreased activity tolerance;Decreased balance;Decreased knowledge of use of DME       PT Treatment Interventions Gait training;Balance training;Stair training;Functional mobility training;Therapeutic activities;Patient/family education;DME instruction;Therapeutic exercise    PT Goals (Current goals can be found in the Care Plan section)  Acute Rehab PT Goals Patient Stated Goal: return home, go fishing PT Goal Formulation: With patient Time For Goal Achievement: 02/28/21 Potential to Achieve Goals: Good    Frequency Min 3X/week     Co-evaluation               AM-PAC PT "6 Clicks" Mobility  Outcome Measure Help needed turning from your back to your side while in a flat bed without using bedrails?: A Little Help needed moving from lying on your back to sitting on the side of a flat bed without using bedrails?: A Little Help needed moving to and from a bed to a chair (including a wheelchair)?: A Little Help needed standing up from a chair using your arms (e.g., wheelchair or bedside chair)?: A Little Help needed to walk in hospital room?: A Little Help needed climbing 3-5 steps with a railing? : A Little 6 Click Score: 18    End of Session Equipment Utilized During Treatment: Oxygen Activity Tolerance: Patient tolerated treatment well Patient left: in chair;with call bell/phone within reach;with chair alarm set Nurse Communication: Mobility status PT  Visit Diagnosis: Other abnormalities of gait and mobility (R26.89);Muscle weakness (generalized) (M62.81)    Time: 9476-5465 PT Time Calculation (min) (ACUTE ONLY): 24 min   Charges:   PT Evaluation $PT Eval Moderate Complexity: 1 Mod PT Treatments $Therapeutic Activity: 8-22 mins        Akiah Bauch P, PT Acute Rehabilitation Services Pager: [phone] Office: [place] 02/14/2021, 8:46 AM

## 2021-02-14 NOTE — Progress Notes (Signed)
Progress Note  Patient Name: Patricia [name] Date of Encounter: 02/14/2021  [facility] Cardiologist: Will Meredith Leeds, MD   Subjective   No CP; mild dyspnea  Inpatient Medications    Scheduled Meds:  apixaban  5 mg Oral BID   carisoprodol  350 mg Oral TID   clonazePAM  1 mg Oral QHS   escitalopram  10 mg Oral Daily   furosemide  40 mg Intravenous BID   gabapentin  600 mg Oral BID   ipratropium  0.5 mg Nebulization TID   linaclotide  145 mcg Oral q1600   magnesium oxide  400 mg Oral Daily   metoprolol tartrate  75 mg Oral BID   pantoprazole  40 mg Oral Daily   polyethylene glycol  34 g Oral q AM   polyvinyl alcohol  1 drop Both Eyes QHS   potassium chloride  40 mEq Oral BID   rosuvastatin  5 mg Oral Once per day on Mon Wed Fri   sodium chloride flush  3 mL Intravenous Q12H   sodium chloride  1 g Oral TID   Continuous Infusions:  sodium chloride 20 mL/hr at 02/13/21 1255   amiodarone 60 mg/hr (02/14/21 0412)   PRN Meds: acetaminophen **OR** acetaminophen, docusate sodium, fluticasone, guaiFENesin-dextromethorphan, levalbuterol, magic mouthwash, nystatin, ondansetron **OR** ondansetron (ZOFRAN) IV, polyvinyl alcohol, traMADol, zolpidem   Vital Signs    Vitals:   02/13/21 2358 02/14/21 0505 02/14/21 0735 02/14/21 0737  BP: 113/81 115/86    Pulse: (!) 109 (!) 108 78   Resp: 19 (!) 21    Temp: 97.9 F (36.6 C) 98 F (36.7 C)    TempSrc: Oral Oral    SpO2: 98% 96% 92% 95%  Weight:  82.8 kg    Height:        Intake/Output Summary (Last 24 hours) at 02/14/2021 0821 Last data filed at 02/13/2021 2359 Gross per 24 hour  Intake 779.14 ml  Output 1300 ml  Net -520.86 ml    Last 3 Weights 02/14/2021 02/13/2021 02/13/2021  Weight (lbs) 182 lb 8.7 oz 185 lb 3 oz 185 lb 3 oz  Weight (kg) 82.8 kg 84 kg 84 kg      Telemetry    Transient atrial flutter now in sinus - Personally Reviewed  Physical Exam   GEN: Chronically ill; NAD Neck: supple Cardiac:  RRR Respiratory: CTA GI: Soft, NT/ND MS: No edema Neuro: Grossly intact Psych: Normal affect   Labs    High Sensitivity Troponin:   Recent Labs  Lab 02/11/21 0719 02/11/21 0925  TROPONINIHS 12 10      Chemistry Recent Labs  Lab 02/08/21 0820 02/11/21 0719 02/12/21 0632 02/13/21 0736  NA 138 137 136 137  K 4.0 4.0 3.1* 4.0  CL 99 99 91* 94*  CO2 31 31 32 33*  GLUCOSE 102* 115* 108* 114*  BUN 16 14 10 10   CREATININE 0.76 0.86 0.74 0.80  CALCIUM 8.7* 8.4* 8.5* 8.6*  MG 1.3* 1.3* 1.4* 1.8  PROT 6.2*  --   --   --   ALBUMIN 3.7  --   --   --   AST 21  --   --   --   ALT 23  --   --   --   ALKPHOS 94  --   --   --   BILITOT 0.4  --   --   --   GFRNONAA >60 >60 >60 >60  ANIONGAP 8 7 13  10  Hematology Recent Labs  Lab 02/11/21 0724 02/12/21 0632 02/13/21 0736  WBC 6.1 2.8* 2.5*  RBC 3.18* 3.25* 3.26*  HGB 10.6* 10.9* 11.0*  HCT 31.2* 31.4* 31.9*  MCV 98.1 96.6 97.9  MCH 33.3 33.5 33.7  MCHC 34.0 34.7 34.5  RDW 23.8* 23.2* 22.9*  PLT 53* 56* 64*     BNP Recent Labs  Lab 02/11/21 0724  BNP 420.9*      Radiology    ECHOCARDIOGRAM LIMITED  Result Date: 02/13/2021    ECHOCARDIOGRAM LIMITED REPORT   Patient Name:   Patricia [name] Date of Exam: 02/13/2021 Medical Rec #:  [id number]          Height:       66.0 in Accession #:    [id number]         Weight:       185.2 lb Date of Birth:  Mar 09, 1950           BSA:          1.935 m Patient Age:    71 years           BP:           117/80 mmHg Patient Gender: F                  HR:           120 bpm. Exam Location:  Inpatient Procedure: 2D Echo, Limited Echo, Cardiac Doppler and Color Doppler Indications:    CHF  History:        Patient has prior history of Echocardiogram examinations.                 Arrythmias:Atrial Fibrillation; Risk Factors:Hypertension and                 Dyslipidemia.  Sonographer:    Jyl Heinz Referring Phys: [place]  1. Left ventricular ejection fraction, by  estimation, is 45 to 50%. The left ventricle has mildly decreased function. The left ventricle demonstrates global hypokinesis.  2. The right ventricular size is mildly enlarged. There is mildly elevated pulmonary artery systolic pressure. The estimated right ventricular systolic pressure is 38.1 mmHg.  3. Right atrial size was mildly dilated.  4. Moderate pleural effusion in the left lateral region.  5. Tricuspid valve regurgitation is moderate.  6. The inferior vena cava is dilated in size with <50% respiratory variability, suggesting right atrial pressure of 15 mmHg. Comparison(s): Prior images reviewed side by side. The left ventricular function is worsened. FINDINGS  Left Ventricle: Left ventricular ejection fraction, by estimation, is 45 to 50%. The left ventricle has mildly decreased function. The left ventricle demonstrates global hypokinesis. Right Ventricle: The right ventricular size is mildly enlarged. There is mildly elevated pulmonary artery systolic pressure. The tricuspid regurgitant velocity is 2.51 m/s, and with an assumed right atrial pressure of 15 mmHg, the estimated right ventricular systolic pressure is 82.9 mmHg. Left Atrium: Left atrial size was normal in size. Right Atrium: Right atrial size was mildly dilated. Tricuspid Valve: The tricuspid valve is normal in structure. Tricuspid valve regurgitation is moderate. Aortic Valve: Aortic valve peak gradient measures 3.4 mmHg. Pulmonic Valve: Pulmonic valve regurgitation is trivial. Venous: The inferior vena cava is dilated in size with less than 50% respiratory variability, suggesting right atrial pressure of 15 mmHg. Additional Comments: There is a moderate pleural effusion in the left lateral region. LEFT VENTRICLE PLAX 2D LVIDd:  4.20 cm     Diastology LVIDs:         3.10 cm     LV e' medial:    9.36 cm/s LV PW:         0.90 cm     LV E/e' medial:  8.4 LV IVS:        1.00 cm     LV e' lateral:   11.10 cm/s LVOT diam:     2.00 cm      LV E/e' lateral: 7.1 LV SV:         37 LV SV Index:   19 LVOT Area:     3.14 cm  LV Volumes (MOD) LV vol d, MOD A2C: 61.2 ml LV vol d, MOD A4C: 56.5 ml LV vol s, MOD A2C: 25.5 ml LV vol s, MOD A4C: 24.2 ml LV SV MOD A2C:     35.7 ml LV SV MOD A4C:     56.5 ml LV SV MOD BP:      34.0 ml RIGHT VENTRICLE            IVC RV S prime:     9.36 cm/s  IVC diam: 2.80 cm TAPSE (M-mode): 1.4 cm LEFT ATRIUM         Index LA diam:    3.10 cm 1.60 cm/m  AORTIC VALVE AV Area (Vmax): 2.60 cm AV Vmax:        91.70 cm/s AV Peak Grad:   3.4 mmHg LVOT Vmax:      76.00 cm/s LVOT Vmean:     54.700 cm/s LVOT VTI:       0.119 m  AORTA Ao Root diam: 3.30 cm Ao Asc diam:  3.10 cm MITRAL VALVE               TRICUSPID VALVE MV Area (PHT): 6.54 cm    TR Peak grad:   25.2 mmHg MV Decel Time: 116 msec    TR Vmax:        251.00 cm/s MV E velocity: 78.40 cm/s                            SHUNTS                            Systemic VTI:  0.12 m                            Systemic Diam: 2.00 cm Candee Furbish MD Electronically signed by Candee Furbish MD Signature Date/Time: 02/13/2021/3:15:15 PM    Final      Patient Profile     71 y.o. female with past medical history of small cell lung cancer, chronic diastolic congestive heart failure paroxysmal atrial fibrillation admitted with atrial flutter.  Assessment & Plan    1 atrial flutter-patient has converted to sinus rhythm.  We will transition amiodarone to 400 mg twice daily for 1 week then 200 mg daily thereafter.  Continue metoprolol and apixaban.    2 acute on chronic diastolic ingestive heart failure-volume status has improved.  Change Lasix to 40 mg by mouth daily.  Decrease potassium to 20 mill equivalents by mouth daily.  Recheck potassium and renal function tomorrow morning.  3 small cell lung cancer-treatment associated with neutropenia and thrombocytopenia; managed by oncology.  For questions or updates, please contact [place] Please consult  [email or web address] for contact info  under        Signed, Kirk Ruths, MD  02/14/2021, 8:21 AM

## 2021-02-14 NOTE — Assessment & Plan Note (Addendum)
Patient was admitted to the cardiac ward, she was placed on continuous telemetry monitoring.   Patient with persistent rapid ventricular response despite medical therapy. She underwent direct current cardioversion on 02/06  Post cardioversion patient back on atrial flutter with rapid ventricular response, medical therapy was adjusted but continue with RVR Medical therapy with amiodarone, metoprolol and digoxin.   02/13 second cardioversion, converting to sinus rhythm. Plan to continue rate and rhythm control and follow up as outpatient. Continue anticoagulation with apixaban.

## 2021-02-14 NOTE — Progress Notes (Signed)
Pt states she wears oxygen 24/7 and does not feel she needs bipap to sleep. VSS. RT will cont to monitor.

## 2021-02-14 NOTE — Progress Notes (Signed)
I called the patient and let her know her reassuring findings from her MRI scan.  We will ask our team to reach out to her to schedule simulation for prophylactic cranial irradiation and for the simulation to take place in the next few days or week or so.  She is in agreement with this plan

## 2021-02-15 DIAGNOSIS — E876 Hypokalemia: Secondary | ICD-10-CM

## 2021-02-15 DIAGNOSIS — I5033 Acute on chronic diastolic (congestive) heart failure: Secondary | ICD-10-CM | POA: Diagnosis not present

## 2021-02-15 DIAGNOSIS — I4892 Unspecified atrial flutter: Secondary | ICD-10-CM | POA: Diagnosis not present

## 2021-02-15 LAB — CBC
HCT: 30.1 % — ABNORMAL LOW (ref 36.0–46.0)
Hemoglobin: 10.1 g/dL — ABNORMAL LOW (ref 12.0–15.0)
MCH: 33.6 pg (ref 26.0–34.0)
MCHC: 33.6 g/dL (ref 30.0–36.0)
MCV: 100 fL (ref 80.0–100.0)
Platelets: 78 10*3/uL — ABNORMAL LOW (ref 150–400)
RBC: 3.01 MIL/uL — ABNORMAL LOW (ref 3.87–5.11)
RDW: 22.8 % — ABNORMAL HIGH (ref 11.5–15.5)
WBC: 2.7 10*3/uL — ABNORMAL LOW (ref 4.0–10.5)
nRBC: 0 % (ref 0.0–0.2)

## 2021-02-15 LAB — BASIC METABOLIC PANEL
Anion gap: 11 (ref 5–15)
BUN: 16 mg/dL (ref 8–23)
CO2: 31 mmol/L (ref 22–32)
Calcium: 8.4 mg/dL — ABNORMAL LOW (ref 8.9–10.3)
Chloride: 93 mmol/L — ABNORMAL LOW (ref 98–111)
Creatinine, Ser: 0.79 mg/dL (ref 0.44–1.00)
GFR, Estimated: >60 mL/min (ref >60)
Glucose, Bld: 92 mg/dL (ref 70–99)
Potassium: 4.1 mmol/L (ref 3.5–5.1)
Sodium: 135 mmol/L (ref 135–145)

## 2021-02-15 MED ORDER — IPRATROPIUM BROMIDE 0.02 % IN SOLN
0.5000 mg | Freq: Two times a day (BID) | RESPIRATORY_TRACT | Status: DC
  Administered 2021-02-16 – 2021-02-21 (×10): 0.5 mg via RESPIRATORY_TRACT
  Filled 2021-02-15 (×11): qty 2.5

## 2021-02-15 NOTE — Assessment & Plan Note (Addendum)
Patient received diuresis with IV furosemide with good toleration.   Further work up with echocardiogram with EF LV 45 to 50%, mild enlargement of the RV, estimated pulmonary systolic pressure 96,2 mmHg.   Continue clinically euvolemic. Continue rate and rhythm control for atrial flutter. Patient will continue taking furosemide as needed for hypervolemia.

## 2021-02-15 NOTE — Hospital Course (Addendum)
Patricia [name] was admitted to the hospital with the working diagnosis of acute heart failure decompensation, complicated with atrial flutter with rapid ventricular response.   71 yo female with the past medical history of HTN, dyslipidemia, atrial fibrillation and flutter, diastolic heart failure and small cell lung cancer who presented with dyspnea. Patient reported progressive dyspnea for 2 months but more intense over 7 days. Recently completed chemotherapy 2 weeks ago. At 4 am on the day of admission she had acute severe dyspnea, that woke her up, with wheezing and cough. She came to the hospital for further evaluation. On her initial physical examination her heart rate was 143, RR 18 to 33, oxygen saturation 85%, her lungs had rales but no wheezing, positive increased work of breathing, heart with S1 and S2 present and tachycardic, irregularly irregular, abdomen soft and positive lower extremity edema.   Na 137, K 4,0 Cl 99, bicarbonate 31, glucose 115 bun 14 and cr 0,86 Mag 1,3  High sensitive troponin 12  BNP 420  Wbc 6,1, hgb 10,6 hct 31,2 and plt 53   Sars covid 19 negative   Chest radiograph with bilateral interstitial infiltrates predominantly at the lower lobes with hilar vascular congestion.   CT chest with bilateral pleural effusions and ground glass opacities. No pulmonary embolism.   EKG 142 bpm, with normal axis and normal qtc, atrial flutter rhythm, with no significant ST segment or T wave changes.   Patient was placed on amiodarone infusion and diuresed with furosemide.  Underwent cardioversion with good toleration.   02/08 patient converted back to atrial flutter with rapid ventricular response. Increased metoprolol to 100 mg.   02/10 continue with RVR, added digoxin. Electrical cardioversion on 02/21/20 and returning to sinus rhythm.  Patient has been on sinus rhythm for 24 hrs, plan to follow as outpatient, continue with amiodarone and AV blockade.

## 2021-02-15 NOTE — Progress Notes (Signed)
Progress Note   Patient: Patricia [name] LPF:[id number] DOB: 1950-10-11 DOA: 02/11/2021     4 DOS: the patient was seen and examined on 02/15/2021   Brief hospital course: Patricia [name] was admitted to the hospital with the working diagnosis of acute heart failure decompensation.   71 yo female with the past medical history of HTN, dyslipidemia, atrial fibrillation and flutter, diastolic heart failure and small cell lung cancer who presented with dyspnea. Patient reported progressive dyspnea for 2 months but more intense over 7 days. Recently completed chemotherapy 2 weeks ago. At 4 am on the day of admission she acute severe dyspnea, that woke her up, with wheezing and cough. On her initial physical examination her heart rate was 143, RR 18 to 33, oxygen saturation 85%, her lungs had rales but no wheezing, positive increased work of breathing, heart with S1 and S2 present and tachycardic, irregularly irregular, abdomen soft and positive lower extremity edema.   Na 137, K 4,0 Cl 99, bicarbonate 31, glucose 115 bun 14 and cr 0,86 Mag 1,3  High sensitive troponin 12  BNP 420  Wbc 6,1, hgb 10,6 hct 31,2 and plt 53   Sars covid 19 negative   Chest radiograph with bilateral interstitial infiltrates predominantly at the lower lobes with hilar vascular congestion.   CT chest with bilateral pleural effusions and ground glass opacities. No pulmonary embolism.   EKG 142 bpm, with normal axis and normal qtc, atrial flutter rhythm, with no significant ST segment or T wave changes.   Patient was placed on amiodarone infusion and diuresed with furosemide.  Underwent cardioversion with good toleration.    Assessment and Plan: * Acute on chronic respiratory failure with hypoxia (HCC) - (present on admission) Patient with improvement in oxygenation after diuresis  Improved volume status Oxygenating this am at 96% on 2 L/min per [place]   Acute on chronic diastolic CHF (congestive heart failure) ([place])-  (present on admission) Patient with preserved LV systolic function. Improved heart failure after rhythm control atrial flutter and fibrillation. Continue blood pressure control and diuresis.  On furosemide and metoprolol.   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 Julien Nordmann of oncology - completed chemotherapy treatments of cisplatin and etoposide approximately 2 weeks ago.,  Also completed lung radiation, plans for prophylactic whole brain radiation in the near future.  Plan to follow up as outpatient with oncology   Atrial flutter with rapid ventricular response ([place]) Patient had diltiazem for short period of time. Sp cardioversion, plan to continue rhythm control with amiodarone and anticoagulation with apixaban. Continue with metoprolol.   Dyslipidemia- (present on admission) Continue with statin therapy.   Antineoplastic chemotherapy induced pancytopenia ([place])- (present on admission) Continue close follow up of cell count, currently no indication for transfusion   Anxiety- (present on admission) - Continue lexapro and clonazepam  Hypokalemia- (present on admission) K has been corrected to 4,1  Renal function stable with serum cr at 0,79.   Hypomagnesemia- (present on admission) Continue correction with Mag         Subjective: Patient is feeling better, dyspnea has improved and she was able to ambulate in the hallway   Physical Exam: Vitals:   02/15/21 0455 02/15/21 0735 02/15/21 0850 02/15/21 1356  BP: 106/67  (!) 106/56   Pulse: 67  81   Resp: 16  20   Temp:   97.8 F (36.6 C)  TempSrc:   Oral   SpO2: 95% 99% 94% 96%  Weight: 80.8 kg     Height:       Neurology awake and alert ENT positive pallor but no icterus Cardiovascular S1 and S2 present and irregular, no gallops or murmurs No JVD No lower extremity edema Respiratory, scattered rhonchi and wheezing,  no rales.  Abdomen soft and non tender  Data Reviewed:  Reviewed   Family Communication: no family at the bedside   Disposition: Status is: Inpatient Remains inpatient appropriate because: telemetry monitoring           Planned Discharge Destination: Home      Author: Tawni Millers, MD 02/15/2021 5:12 PM  For on call review [email or web address].

## 2021-02-15 NOTE — Progress Notes (Signed)
Progress Note  Patient Name: Patricia [name] Date of Encounter: 02/15/2021  [facility] Cardiologist: Will Meredith Leeds, MD   Subjective   Denies CP or dyspnea; "congested"  Inpatient Medications    Scheduled Meds:  amiodarone  400 mg Oral BID   apixaban  5 mg Oral BID   carisoprodol  350 mg Oral TID   clonazePAM  1 mg Oral QHS   escitalopram  10 mg Oral Daily   furosemide  40 mg Oral Daily   gabapentin  600 mg Oral BID   ipratropium  0.5 mg Nebulization TID   linaclotide  145 mcg Oral q1600   magnesium oxide  400 mg Oral Daily   metoprolol tartrate  75 mg Oral BID   pantoprazole  40 mg Oral Daily   polyethylene glycol  34 g Oral q AM   polyvinyl alcohol  1 drop Both Eyes QHS   potassium chloride  20 mEq Oral Daily   rosuvastatin  5 mg Oral Once per day on Mon Wed Fri   sodium chloride flush  3 mL Intravenous Q12H   sodium chloride  1 g Oral TID   Continuous Infusions:  sodium chloride 20 mL/hr at 02/13/21 1255   PRN Meds: acetaminophen **OR** acetaminophen, docusate sodium, fluticasone, guaiFENesin-dextromethorphan, levalbuterol, magic mouthwash, nystatin, ondansetron **OR** ondansetron (ZOFRAN) IV, polyvinyl alcohol, traMADol, zolpidem   Vital Signs    Vitals:   02/15/21 0223 02/15/21 0455 02/15/21 0735 02/15/21 0850  BP: 101/67 106/67  (!) 106/56  Pulse: 72 67  81  Resp: 18 16  20   Temp:    97.8 F (36.6 C)  TempSrc:    Oral  SpO2: 97% 95% 99% 94%  Weight:  80.8 kg    Height:        Intake/Output Summary (Last 24 hours) at 02/15/2021 0926 Last data filed at 02/15/2021 0856 Gross per 24 hour  Intake 980.88 ml  Output 880 ml  Net 100.88 ml    Last 3 Weights 02/15/2021 02/14/2021 02/13/2021  Weight (lbs) 178 lb 1.6 oz 182 lb 8.7 oz 185 lb 3 oz  Weight (kg) 80.786 kg 82.8 kg 84 kg      Telemetry    Sinus - Personally Reviewed  Physical Exam   GEN: Chronically ill; NAD; WD Neck: No JVD Cardiac: RRR, no murmur Respiratory: CTA; no wheeze GI:  Soft, NT/ND, no masses MS: No edema Neuro: No focal findings Psych: Normal affect   Labs    High Sensitivity Troponin:   Recent Labs  Lab 02/11/21 0719 02/11/21 0925  TROPONINIHS 12 10      Chemistry Recent Labs  Lab 02/11/21 0719 02/12/21 0632 02/13/21 0736 02/14/21 0817 02/15/21 0450  NA 137 136 137 137 135  K 4.0 3.1* 4.0 3.9 4.1  CL 99 91* 94* 92* 93*  CO2 31 32 33* 34* 31  GLUCOSE 115* 108* 114* 117* 92  BUN 14 10 10 12 16   CREATININE 0.86 0.74 0.80 0.90 0.79  CALCIUM 8.4* 8.5* 8.6* 9.2 8.4*  MG 1.3* 1.4* 1.8  --   --   GFRNONAA >60 >60 >60 >60 >60  ANIONGAP 7 13 10 11 11       Hematology Recent Labs  Lab 02/12/21 0632 02/13/21 0736 02/15/21 0450  WBC 2.8* 2.5* 2.7*  RBC 3.25* 3.26* 3.01*  HGB 10.9* 11.0* 10.1*  HCT 31.4* 31.9* 30.1*  MCV 96.6 97.9 100.0  MCH 33.5 33.7 33.6  MCHC 34.7 34.5 33.6  RDW 23.2* 22.9*  22.8*  PLT 56* 64* 78*     BNP Recent Labs  Lab 02/11/21 0724  BNP 420.9*      Radiology    MR BRAIN W WO CONTRAST  Result Date: 02/14/2021 CLINICAL DATA:  Small cell lung cancer (SCLC), staging EXAM: MRI HEAD WITHOUT AND WITH CONTRAST TECHNIQUE: Multiplanar, multiecho pulse sequences of the brain and surrounding structures were obtained without and with intravenous contrast. CONTRAST:  68mL GADAVIST GADOBUTROL 1 MMOL/ML IV SOLN COMPARISON:  10/16/2020 FINDINGS: Brain: There is no acute infarction or intracranial hemorrhage. There is no intracranial mass, mass effect, or edema. There is no hydrocephalus or extra-axial fluid collection. Ventricles and sulci are stable in size and configuration. Patchy T2 hyperintensity in the supratentorial white matter is nonspecific but may reflect mild chronic microvascular ischemic changes. No abnormal enhancement. Vascular: Major vessel flow voids at the skull base are preserved. Skull and upper cervical spine: Normal marrow signal is preserved. Sinuses/Orbits: Focal anterior right ethmoid air cell  opacification. Orbits are unremarkable. Other: Sella is unremarkable. Patchy left greater than right mastoid fluid opacification. IMPRESSION: No evidence of intracranial metastatic disease. Electronically Signed   By: Macy Mis M.D.   On: 02/14/2021 13:07   ECHOCARDIOGRAM LIMITED  Result Date: 02/13/2021    ECHOCARDIOGRAM LIMITED REPORT   Patient Name:   Patricia [name] Date of Exam: 02/13/2021 Medical Rec #:  [id number]          Height:       66.0 in Accession #:    [id number]         Weight:       185.2 lb Date of Birth:  November 02, 1950           BSA:          1.935 m Patient Age:    71 years           BP:           117/80 mmHg Patient Gender: F                  HR:           120 bpm. Exam Location:  Inpatient Procedure: 2D Echo, Limited Echo, Cardiac Doppler and Color Doppler Indications:    CHF  History:        Patient has prior history of Echocardiogram examinations.                 Arrythmias:Atrial Fibrillation; Risk Factors:Hypertension and                 Dyslipidemia.  Sonographer:    Jyl Heinz Referring Phys: [place]  1. Left ventricular ejection fraction, by estimation, is 45 to 50%. The left ventricle has mildly decreased function. The left ventricle demonstrates global hypokinesis.  2. The right ventricular size is mildly enlarged. There is mildly elevated pulmonary artery systolic pressure. The estimated right ventricular systolic pressure is 27.0 mmHg.  3. Right atrial size was mildly dilated.  4. Moderate pleural effusion in the left lateral region.  5. Tricuspid valve regurgitation is moderate.  6. The inferior vena cava is dilated in size with <50% respiratory variability, suggesting right atrial pressure of 15 mmHg. Comparison(s): Prior images reviewed side by side. The left ventricular function is worsened. FINDINGS  Left Ventricle: Left ventricular ejection fraction, by estimation, is 45 to 50%. The left ventricle has mildly decreased function. The left ventricle  demonstrates global hypokinesis. Right Ventricle: The right ventricular  size is mildly enlarged. There is mildly elevated pulmonary artery systolic pressure. The tricuspid regurgitant velocity is 2.51 m/s, and with an assumed right atrial pressure of 15 mmHg, the estimated right ventricular systolic pressure is 17.0 mmHg. Left Atrium: Left atrial size was normal in size. Right Atrium: Right atrial size was mildly dilated. Tricuspid Valve: The tricuspid valve is normal in structure. Tricuspid valve regurgitation is moderate. Aortic Valve: Aortic valve peak gradient measures 3.4 mmHg. Pulmonic Valve: Pulmonic valve regurgitation is trivial. Venous: The inferior vena cava is dilated in size with less than 50% respiratory variability, suggesting right atrial pressure of 15 mmHg. Additional Comments: There is a moderate pleural effusion in the left lateral region. LEFT VENTRICLE PLAX 2D LVIDd:         4.20 cm     Diastology LVIDs:         3.10 cm     LV e' medial:    9.36 cm/s LV PW:         0.90 cm     LV E/e' medial:  8.4 LV IVS:        1.00 cm     LV e' lateral:   11.10 cm/s LVOT diam:     2.00 cm     LV E/e' lateral: 7.1 LV SV:         37 LV SV Index:   19 LVOT Area:     3.14 cm  LV Volumes (MOD) LV vol d, MOD A2C: 61.2 ml LV vol d, MOD A4C: 56.5 ml LV vol s, MOD A2C: 25.5 ml LV vol s, MOD A4C: 24.2 ml LV SV MOD A2C:     35.7 ml LV SV MOD A4C:     56.5 ml LV SV MOD BP:      34.0 ml RIGHT VENTRICLE            IVC RV S prime:     9.36 cm/s  IVC diam: 2.80 cm TAPSE (M-mode): 1.4 cm LEFT ATRIUM         Index LA diam:    3.10 cm 1.60 cm/m  AORTIC VALVE AV Area (Vmax): 2.60 cm AV Vmax:        91.70 cm/s AV Peak Grad:   3.4 mmHg LVOT Vmax:      76.00 cm/s LVOT Vmean:     54.700 cm/s LVOT VTI:       0.119 m  AORTA Ao Root diam: 3.30 cm Ao Asc diam:  3.10 cm MITRAL VALVE               TRICUSPID VALVE MV Area (PHT): 6.54 cm    TR Peak grad:   25.2 mmHg MV Decel Time: 116 msec    TR Vmax:        251.00 cm/s MV E velocity:  78.40 cm/s                            SHUNTS                            Systemic VTI:  0.12 m                            Systemic Diam: 2.00 cm Candee Furbish MD Electronically signed by Candee Furbish MD Signature Date/Time: 02/13/2021/3:15:15 PM    Final      Patient  Profile     71 y.o. female with past medical history of small cell lung cancer, chronic diastolic congestive heart failure paroxysmal atrial fibrillation admitted with atrial flutter.  Assessment & Plan    1 atrial flutter-patient remains in sinus rhythm today.  Continue amiodarone 400 mg twice daily for 1 week then 200 mg daily thereafter.  Continue present dose of metoprolol and apixaban.    2 acute on chronic diastolic ingestive heart failure-she appears to be euvolemic on examination.  Continue Lasix and potassium at present dose.    3 small cell lung cancer-treatment associated with neutropenia and thrombocytopenia; managed by oncology.  Patient can be discharged from a cardiac standpoint.  Would plan to check potassium and renal function 1 week following discharge.  We will arrange follow-up with APP in 2 weeks.  Follow-up Dr. Curt Bears in 3 months.  Cardiology will sign off.  Please call with questions.  For questions or updates, please contact [place] Please consult [email or web address] for contact info under        Signed, Kirk Ruths, MD  02/15/2021, 9:26 AM

## 2021-02-15 NOTE — Progress Notes (Signed)
Mobility Specialist Criteria Algorithm Info.    02/15/21 1100  Mobility  Activity Ambulated with assistance in hallway  Range of Motion/Exercises Active;All extremities  Level of Assistance Modified independent, requires aide device or extra time  Assistive Device Front wheel walker  Distance Ambulated (ft) 240 ft  Activity Response Tolerated well   Patient received in bed eager to participate. Ambulated in hallway mod I with steady gait. Returned to room and requested to sit in recliner chair. Tolerated without complaint or incident. Was left in chair with all needs met, call bell in reach.  02/15/2021 4:33 PM  Patricia [name], [name], [place]  [facility]:[phone] Office: [phone]

## 2021-02-16 ENCOUNTER — Encounter (HOSPITAL_COMMUNITY): Payer: Self-pay | Admitting: Internal Medicine

## 2021-02-16 DIAGNOSIS — I5033 Acute on chronic diastolic (congestive) heart failure: Secondary | ICD-10-CM | POA: Diagnosis not present

## 2021-02-16 DIAGNOSIS — J9621 Acute and chronic respiratory failure with hypoxia: Secondary | ICD-10-CM | POA: Diagnosis not present

## 2021-02-16 DIAGNOSIS — I4892 Unspecified atrial flutter: Secondary | ICD-10-CM | POA: Diagnosis not present

## 2021-02-16 LAB — BASIC METABOLIC PANEL
Anion gap: 8 (ref 5–15)
BUN: 14 mg/dL (ref 8–23)
CO2: 32 mmol/L (ref 22–32)
Calcium: 8.7 mg/dL — ABNORMAL LOW (ref 8.9–10.3)
Chloride: 96 mmol/L — ABNORMAL LOW (ref 98–111)
Creatinine, Ser: 0.72 mg/dL (ref 0.44–1.00)
GFR, Estimated: >60 mL/min (ref >60)
Glucose, Bld: 101 mg/dL — ABNORMAL HIGH (ref 70–99)
Potassium: 4.3 mmol/L (ref 3.5–5.1)
Sodium: 136 mmol/L (ref 135–145)

## 2021-02-16 LAB — CULTURE, BLOOD (ROUTINE X 2)
Culture: NO GROWTH
Culture: NO GROWTH
Special Requests: ADEQUATE
Special Requests: ADEQUATE

## 2021-02-16 LAB — MAGNESIUM: Magnesium: 1.7 mg/dL (ref 1.7–2.4)

## 2021-02-16 MED ORDER — MAGNESIUM SULFATE 2 GM/50ML IV SOLN
2.0000 g | Freq: Once | INTRAVENOUS | Status: AC
  Administered 2021-02-16: 2 g via INTRAVENOUS
  Filled 2021-02-16: qty 50

## 2021-02-16 MED ORDER — METOPROLOL TARTRATE 100 MG PO TABS
100.0000 mg | ORAL_TABLET | Freq: Two times a day (BID) | ORAL | Status: DC
  Administered 2021-02-16 – 2021-02-21 (×10): 100 mg via ORAL
  Filled 2021-02-16 (×11): qty 1

## 2021-02-16 NOTE — Progress Notes (Addendum)
Physical Therapy Treatment Patient Details Name: Patricia [name] MRN: [id number] DOB: [birth year] Today's Date: 02/16/2021   History of Present Illness 71 y.o. female admitted 1/24 with DOE and tachycardia. Pt with acute on chronic respiratory failure and CHF. 2/6 DCCV. PMHx: HTN, back surgery, ORIF radius and ulna fx, Lung CA with thrombocytopenia, Afib.    PT Comments    Pt pleasant and very willing to progress gait and activity. Pt able to walk in hall, complete stairs and repeated sit to stands this session. Discussed walking program for home as well as continued sit to stand trials for strength and balance. Pt's ultimate goal is to be able to return to walking without RW.  HR 115-120 aflutter throughout with sats 95-100% on 2L   Recommendations for follow up therapy are one component of a multi-disciplinary discharge planning process, led by the attending physician.  Recommendations may be updated based on patient status, additional functional criteria and insurance authorization.  Follow Up Recommendations  Home health PT     Assistance Recommended at Discharge PRN  Patient can return home with the following Assist for transportation;Assistance with cooking/housework;A little help with bathing/dressing/bathroom   Equipment Recommendations  None recommended by PT    Recommendations for Other Services       Precautions / Restrictions Precautions Precautions: Fall Precaution Comments: watch sats and HR     Mobility  Bed Mobility Overal bed mobility: Modified Independent             General bed mobility comments: with rail    Transfers Overall transfer level: Needs assistance   Transfers: Sit to/from Stand Sit to Stand: Supervision           General transfer comment: cues for hand placement not to pull up on RW, repeated sit to stands x 5 in 32 seconds from recliner with reliance on hands    Ambulation/Gait Ambulation/Gait assistance: Supervision Gait  Distance (Feet): 240 Feet Assistive device: Rolling walker (2 wheels) Gait Pattern/deviations: Step-through pattern, Trunk flexed   Gait velocity interpretation: 1.31 - 2.62 ft/sec, indicative of limited community ambulator   General Gait Details: cues for posture and position in RW with pt able to self regulate distance   Stairs Stairs: Yes Stairs assistance: Supervision Stair Management: Step to pattern, Forwards, Two rails Number of Stairs: 2 General stair comments: pt able to complete with safety and reliance on rail   Wheelchair Mobility    Modified Rankin (Stroke Patients Only)       Balance Overall balance assessment: Mild deficits observed, not formally tested                                          Cognition Arousal/Alertness: Awake/alert Behavior During Therapy: WFL for tasks assessed/performed Overall Cognitive Status: Within Functional Limits for tasks assessed                                          Exercises      General Comments        Pertinent Vitals/Pain Pain Assessment Pain Assessment: No/denies pain    Home Living                          Prior Function  PT Goals (current goals can now be found in the care plan section) Progress towards PT goals: Progressing toward goals    Frequency    Min 3X/week      PT Plan Current plan remains appropriate    Co-evaluation              AM-PAC PT "6 Clicks" Mobility   Outcome Measure  Help needed turning from your back to your side while in a flat bed without using bedrails?: A Little Help needed moving from lying on your back to sitting on the side of a flat bed without using bedrails?: A Little Help needed moving to and from a bed to a chair (including a wheelchair)?: A Little Help needed standing up from a chair using your arms (e.g., wheelchair or bedside chair)?: A Little Help needed to walk in hospital room?: A  Little Help needed climbing 3-5 steps with a railing? : A Little 6 Click Score: 18    End of Session Equipment Utilized During Treatment: Oxygen Activity Tolerance: Patient tolerated treatment well Patient left: in chair;with call bell/phone within reach;with chair alarm set Nurse Communication: Mobility status PT Visit Diagnosis: Other abnormalities of gait and mobility (R26.89);Muscle weakness (generalized) (M62.81)     Time: 3295-1884 PT Time Calculation (min) (ACUTE ONLY): 25 min  Charges:  $Gait Training: 8-22 mins $Therapeutic Activity: 8-22 mins                     Suhan Paci P, PT Acute Rehabilitation Services Pager: [phone] Office: [place] 02/16/2021, 9:35 AM

## 2021-02-16 NOTE — Progress Notes (Addendum)
Progress Note   Patient: Patricia [name] OYD:[id number] DOB: [birth year] DOA: 02/11/2021     5 DOS: the patient was seen and examined on 02/16/2021   Brief hospital course: Patricia [name] was admitted to the hospital with the working diagnosis of acute heart failure decompensation, complicated with atrial flutter with rapid ventricular response.   71 yo female with the past medical history of HTN, dyslipidemia, atrial fibrillation and flutter, diastolic heart failure and small cell lung cancer who presented with dyspnea. Patient reported progressive dyspnea for 2 months but more intense over 7 days. Recently completed chemotherapy 2 weeks ago. At 4 am on the day of admission she acute severe dyspnea, that woke her up, with wheezing and cough. On her initial physical examination her heart rate was 143, RR 18 to 33, oxygen saturation 85%, her lungs had rales but no wheezing, positive increased work of breathing, heart with S1 and S2 present and tachycardic, irregularly irregular, abdomen soft and positive lower extremity edema.   Na 137, K 4,0 Cl 99, bicarbonate 31, glucose 115 bun 14 and cr 0,86 Mag 1,3  High sensitive troponin 12  BNP 420  Wbc 6,1, hgb 10,6 hct 31,2 and plt 53   Sars covid 19 negative   Chest radiograph with bilateral interstitial infiltrates predominantly at the lower lobes with hilar vascular congestion.   CT chest with bilateral pleural effusions and ground glass opacities. No pulmonary embolism.   EKG 142 bpm, with normal axis and normal qtc, atrial flutter rhythm, with no significant ST segment or T wave changes.   Patient was placed on amiodarone infusion and diuresed with furosemide.  Underwent cardioversion with good toleration.   02/08 patient converted back to atrial flutter with rapid ventricular response. Increased metoprolol to 100 mg.    Assessment and Plan: * Acute on chronic respiratory failure with hypoxia (HCC) - (present on admission) Telemetry patient  on atrial flutter with rapid ventricular response.  She has rales on auscultation but no wheezing or edema at the lower extremities.  Stable oxygenating this am at 96% on 2 L/min per [place]   Acute on chronic diastolic CHF (congestive heart failure) ([place])- (present on admission) Echocardiogram with EF LV 45 to 50%, mild enlargement of the RV, estimated pulmonary systolic pressure 67,2 mmHg.   Back in atrial flutter with rapid ventricular response Increased metoprolol to 100 mg  Continue diuresis with furosemide.    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 Julien [name] of oncology - completed chemotherapy treatments of cisplatin and etoposide approximately 2 weeks ago.,  Also completed lung radiation, plans for prophylactic whole brain radiation in the near future.   Outpatient oncology follow up.   Atrial flutter with rapid ventricular response (HCC) Yesterday evening patient back in atrial flutter with rapid ventricular response.  Telemetry personally reviewed HR 120's this am. Patient is sp cardioversion.   Plan to increase metoprolol to 100 mg and continue with amiodarone oral loading. Continue anticoagulation with apixaban  Continue with telemetry monitoring, check EKG today.   Antineoplastic chemotherapy induced pancytopenia ([place])- (present on admission) Patient continue to have pancytopenia, wbc 2,7, hgb 10.1, hct 30 and plt 78. Continue close follow up cell count as outpatient.   Oral thrush continue with nystatin.   Dyslipidemia- (present on admission) On statin therapy.   Anxiety- (present on admission) Patient on sertraline and escitalopram.  Continue with clonazepam.  Hypomagnesemia- (present on admission) Hypokalemia.  Mag this am is 1,7 with K at 4,3 renal function stable with serum cr at 0,72 and bicarbonate at 32. Na 136  Add 2 g Mag sulfate IV and continue close  electrolyte monitoring.  Continue diuresis with furosemide  Hold on Na cl tablets for now.   Hypokalemia-resolved as of 02/16/2021, (present on admission) K has been corrected to 4,1  Renal function stable with serum cr at 0,79.         Subjective: patient with positive dyspnea on exertion, no cough or wheezing, no edema., no chest pain, no nausea or vomiting.   Physical Exam: Vitals:   02/16/21 0005 02/16/21 0547 02/16/21 0630 02/16/21 0846  BP: 97/61 119/88  (!) 142/90  Pulse: (!) 120 (!) 116  (!) 124  Resp: 19 (!) 21    Temp:  97.9 F (36.6 C)    TempSrc:  Oral    SpO2: 96% 96%    Weight:   80.7 kg   Height:       Neurology awake and alert ENT mild pallor Cardiovascular. Heart with S1 and S2 irregular and tachycardic, no gallops.  No JVD No lower extremity edema Respiratory, with no wheezing or rhonchi, positive rales bilaterally  Abdomen soft and non tender  Data Reviewed:  Reviewed   Family Communication: no family at the bedside   Disposition: Status is: Inpatient Remains inpatient appropriate because: uncontrolled atrial flutter.           Planned Discharge Destination: Home      Author: Tawni Millers, MD 02/16/2021 9:13 AM  For on call review [email or web address].

## 2021-02-16 NOTE — Care Management Important Message (Signed)
Important Message  Patient Details  Name: Patricia [name] MRN: [id number] Date of Birth: 1950-09-29   Medicare Important Message Given:  Yes     Shelda Altes 02/16/2021, 9:35 AM

## 2021-02-16 NOTE — Progress Notes (Signed)
Mobility Specialist Criteria Algorithm Info.    02/16/21 1527  Mobility  Activity Ambulated with assistance in hallway  Range of Motion/Exercises Active;All extremities  Level of Assistance Standby assist, set-up cues, supervision of patient - no hands on  Assistive Device Front wheel walker  Distance Ambulated (ft) 280 ft  Activity Response Tolerated well   Patient received in bed agreeable to participate. Ambulated in hallway supervision level with steady gait. Returned to room without complaint or incident. Was left lying supine in bed with all needs met and RN present.  02/16/2021 3:50 PM  [place] Aspasia Rude, [place], [place]  [facility]:[phone] Office: [phone]

## 2021-02-16 NOTE — Progress Notes (Signed)
Progress Note  Patient Name: Patricia [name] Date of Encounter: 02/16/2021  [facility] Cardiologist: Will Meredith Leeds, MD   Subjective   Remains mildly congested.  She denies chest pain or palpitations.  Inpatient Medications    Scheduled Meds:  amiodarone  400 mg Oral BID   apixaban  5 mg Oral BID   carisoprodol  350 mg Oral TID   clonazePAM  1 mg Oral QHS   escitalopram  10 mg Oral Daily   furosemide  40 mg Oral Daily   gabapentin  600 mg Oral BID   ipratropium  0.5 mg Nebulization BID   linaclotide  145 mcg Oral q1600   magnesium oxide  400 mg Oral Daily   metoprolol tartrate  75 mg Oral BID   pantoprazole  40 mg Oral Daily   polyethylene glycol  34 g Oral q AM   polyvinyl alcohol  1 drop Both Eyes QHS   potassium chloride  20 mEq Oral Daily   rosuvastatin  5 mg Oral Once per day on Mon Wed Fri   sodium chloride flush  3 mL Intravenous Q12H   sodium chloride  1 g Oral TID   Continuous Infusions:  sodium chloride 20 mL/hr at 02/13/21 1255   PRN Meds: acetaminophen **OR** acetaminophen, docusate sodium, fluticasone, guaiFENesin-dextromethorphan, levalbuterol, magic mouthwash, nystatin, ondansetron **OR** ondansetron (ZOFRAN) IV, polyvinyl alcohol, traMADol, zolpidem   Vital Signs    Vitals:   02/15/21 2058 02/16/21 0005 02/16/21 0547 02/16/21 0630  BP:  97/61 119/88   Pulse:  (!) 120 (!) 116   Resp:  19 (!) 21   Temp:   97.9 F (36.6 C)   TempSrc:   Oral   SpO2: 97% 96% 96%   Weight:    80.7 kg  Height:        Intake/Output Summary (Last 24 hours) at 02/16/2021 0736 Last data filed at 02/16/2021 0600 Gross per 24 hour  Intake 240 ml  Output 1200 ml  Net -960 ml    Last 3 Weights 02/16/2021 02/15/2021 02/14/2021  Weight (lbs) 177 lb 14.4 oz 178 lb 1.6 oz 182 lb 8.7 oz  Weight (kg) 80.695 kg 80.786 kg 82.8 kg      Telemetry    Atrial flutter with elevated rate- Personally Reviewed  Physical Exam   GEN: NAD Neck: supple Cardiac: regular and  tachycardic Respiratory: CTA; no rhonchi GI: Soft, NT MS: No edema Neuro: Grossly intact Psych: Normal affect   Labs    High Sensitivity Troponin:   Recent Labs  Lab 02/11/21 0719 02/11/21 0925  TROPONINIHS 12 10      Chemistry Recent Labs  Lab 02/12/21 0632 02/13/21 0736 02/14/21 0817 02/15/21 0450 02/16/21 0536  NA 136 137 137 135 136  K 3.1* 4.0 3.9 4.1 4.3  CL 91* 94* 92* 93* 96*  CO2 32 33* 34* 31 32  GLUCOSE 108* 114* 117* 92 101*  BUN 10 10 12 16 14   CREATININE 0.74 0.80 0.90 0.79 0.72  CALCIUM 8.5* 8.6* 9.2 8.4* 8.7*  MG 1.4* 1.8  --   --  1.7  GFRNONAA >60 >60 >60 >60 >60  ANIONGAP 13 10 11 11 8       Hematology Recent Labs  Lab 02/12/21 0632 02/13/21 0736 02/15/21 0450  WBC 2.8* 2.5* 2.7*  RBC 3.25* 3.26* 3.01*  HGB 10.9* 11.0* 10.1*  HCT 31.4* 31.9* 30.1*  MCV 96.6 97.9 100.0  MCH 33.5 33.7 33.6  MCHC 34.7 34.5 33.6  RDW 23.2* 22.9* 22.8*  PLT 56* 64* 78*     BNP Recent Labs  Lab 02/11/21 0724  BNP 420.9*      Radiology    MR BRAIN W WO CONTRAST  Result Date: 02/14/2021 CLINICAL DATA:  Small cell lung cancer (SCLC), staging EXAM: MRI HEAD WITHOUT AND WITH CONTRAST TECHNIQUE: Multiplanar, multiecho pulse sequences of the brain and surrounding structures were obtained without and with intravenous contrast. CONTRAST:  31mL GADAVIST GADOBUTROL 1 MMOL/ML IV SOLN COMPARISON:  10/16/2020 FINDINGS: Brain: There is no acute infarction or intracranial hemorrhage. There is no intracranial mass, mass effect, or edema. There is no hydrocephalus or extra-axial fluid collection. Ventricles and sulci are stable in size and configuration. Patchy T2 hyperintensity in the supratentorial white matter is nonspecific but may reflect mild chronic microvascular ischemic changes. No abnormal enhancement. Vascular: Major vessel flow voids at the skull base are preserved. Skull and upper cervical spine: Normal marrow signal is preserved. Sinuses/Orbits: Focal  anterior right ethmoid air cell opacification. Orbits are unremarkable. Other: Sella is unremarkable. Patchy left greater than right mastoid fluid opacification. IMPRESSION: No evidence of intracranial metastatic disease. Electronically Signed   By: Macy Mis M.D.   On: 02/14/2021 13:07     Patient Profile     71 y.o. female with past medical history of small cell lung cancer, chronic diastolic congestive heart failure paroxysmal atrial fibrillation admitted with atrial flutter.  Assessment & Plan    1 atrial flutter-patient is back in atrial flutter this morning.  Heart rate is elevated.  Increase metoprolol to 100 mg twice daily.  Continue amiodarone 400 mg twice daily for 1 week then 200 mg daily thereafter.  If atrial flutter persists would likely proceed with repeat cardioversion in 4 weeks (hopefully the more amiodarone in her system the more likely she would be to hold sinus rhythm).  Continue apixaban.  2 acute on chronic diastolic ingestive heart failure-remains euvolemic on examination.  Continue Lasix and potassium at present dose.  3 small cell lung cancer-treatment associated with neutropenia and thrombocytopenia; managed by oncology.  For questions or updates, please contact [place] Please consult [email or web address] for contact info under        Signed, Kirk Ruths, MD  02/16/2021, 7:36 AM

## 2021-02-17 DIAGNOSIS — I5033 Acute on chronic diastolic (congestive) heart failure: Secondary | ICD-10-CM | POA: Diagnosis not present

## 2021-02-17 DIAGNOSIS — I4892 Unspecified atrial flutter: Secondary | ICD-10-CM | POA: Diagnosis not present

## 2021-02-17 LAB — BASIC METABOLIC PANEL
Anion gap: 9 (ref 5–15)
BUN: 12 mg/dL (ref 8–23)
CO2: 29 mmol/L (ref 22–32)
Calcium: 8.7 mg/dL — ABNORMAL LOW (ref 8.9–10.3)
Chloride: 94 mmol/L — ABNORMAL LOW (ref 98–111)
Creatinine, Ser: 0.85 mg/dL (ref 0.44–1.00)
GFR, Estimated: >60 mL/min (ref >60)
Glucose, Bld: 105 mg/dL — ABNORMAL HIGH (ref 70–99)
Potassium: 4.1 mmol/L (ref 3.5–5.1)
Sodium: 132 mmol/L — ABNORMAL LOW (ref 135–145)

## 2021-02-17 LAB — MAGNESIUM: Magnesium: 1.8 mg/dL (ref 1.7–2.4)

## 2021-02-17 MED ORDER — DIGOXIN 125 MCG PO TABS
0.1250 mg | ORAL_TABLET | Freq: Every day | ORAL | Status: DC
  Administered 2021-02-18 – 2021-02-21 (×4): 0.125 mg via ORAL
  Filled 2021-02-17 (×4): qty 1

## 2021-02-17 MED ORDER — DIGOXIN 0.25 MG/ML IJ SOLN
0.2500 mg | Freq: Once | INTRAMUSCULAR | Status: AC
  Administered 2021-02-17: 0.25 mg via INTRAVENOUS
  Filled 2021-02-17: qty 1

## 2021-02-17 MED ORDER — UREA 15 G PO PACK
15.0000 g | PACK | Freq: Two times a day (BID) | ORAL | Status: DC
  Administered 2021-02-17: 15 g via ORAL
  Filled 2021-02-17 (×2): qty 1

## 2021-02-17 MED ORDER — FUROSEMIDE 40 MG PO TABS
40.0000 mg | ORAL_TABLET | Freq: Two times a day (BID) | ORAL | Status: DC
  Administered 2021-02-17 – 2021-02-18 (×2): 40 mg via ORAL
  Filled 2021-02-17 (×2): qty 1

## 2021-02-17 MED ORDER — FUROSEMIDE 10 MG/ML IJ SOLN
20.0000 mg | Freq: Once | INTRAMUSCULAR | Status: AC
  Administered 2021-02-17: 20 mg via INTRAVENOUS
  Filled 2021-02-17: qty 2

## 2021-02-17 NOTE — Progress Notes (Addendum)
Progress Note   Patient: Patricia [name] KDX:[id number] DOB: 04-03-1950 DOA: 02/11/2021     6 DOS: the patient was seen and examined on 02/17/2021   Brief hospital course: Patricia [name] was admitted to the hospital with the working diagnosis of acute heart failure decompensation, complicated with atrial flutter with rapid ventricular response.   71 yo female with the past medical history of HTN, dyslipidemia, atrial fibrillation and flutter, diastolic heart failure and small cell lung cancer who presented with dyspnea. Patient reported progressive dyspnea for 2 months but more intense over 7 days. Recently completed chemotherapy 2 weeks ago. At 4 am on the day of admission she acute severe dyspnea, that woke her up, with wheezing and cough. On her initial physical examination her heart rate was 143, RR 18 to 33, oxygen saturation 85%, her lungs had rales but no wheezing, positive increased work of breathing, heart with S1 and S2 present and tachycardic, irregularly irregular, abdomen soft and positive lower extremity edema.   Na 137, K 4,0 Cl 99, bicarbonate 31, glucose 115 bun 14 and cr 0,86 Mag 1,3  High sensitive troponin 12  BNP 420  Wbc 6,1, hgb 10,6 hct 31,2 and plt 53   Sars covid 19 negative   Chest radiograph with bilateral interstitial infiltrates predominantly at the lower lobes with hilar vascular congestion.   CT chest with bilateral pleural effusions and ground glass opacities. No pulmonary embolism.   EKG 142 bpm, with normal axis and normal qtc, atrial flutter rhythm, with no significant ST segment or T wave changes.   Patient was placed on amiodarone infusion and diuresed with furosemide.  Underwent cardioversion with good toleration.   02/08 patient converted back to atrial flutter with rapid ventricular response. Increased metoprolol to 100 mg.   02/10 continue with RVR, added digoxin and may need second electrical cardioversion.    Assessment and Plan: * Acute on  chronic respiratory failure with hypoxia (HCC) - (present on admission) Patient continue to have dyspnea on exertion. Her oxymetry is 95 to 98% on 2 L.min per [place] Continue heart failure management and supplemental -02 per [place]   Acute on chronic diastolic CHF (congestive heart failure) ([place])- (present on admission) Echocardiogram with EF LV 45 to 50%, mild enlargement of the RV, estimated pulmonary systolic pressure 97,6 mmHg.   Patient has been back on RVR for the last 48 hrs, today she has positive JVD and trace lower extremity edema.   Will give one dose of furosemide 20 mg Iv and continue increase furosemide to 40 mg po bid. Continue rate control atrial flutter.  Continue with metoprolol.    Atrial flutter with rapid ventricular response (HCC) sp cardioversion on this hospitalization.   She continue with rapid ventricular response for the last 48 hrs. Telemetry with atrial flutter.   Case discussed with cardiology Dr Stanford Breed, plan to continue rate control with amiodarone loading, metoprolol 100 mg and digoxin (no dose IV today). Continue telemetry monitoring.  May need second cardioversion on this hospitalization Continue anticoagulation with apixaban.     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 Julien Nordmann of oncology - completed chemotherapy treatments of cisplatin and etoposide approximately 2 weeks ago.,  Also completed lung radiation, plans for prophylactic whole brain radiation in the near future.   Outpatient oncology follow up.   Antineoplastic chemotherapy induced pancytopenia ([place])- (present on admission) Patient continue to  have pancytopenia, wbc 2,7, hgb 10.1, hct 30 and plt 78..   Oral thrush continue with nystatin.   Dyslipidemia- (present on admission) Continue with statin therapy.   Anxiety- (present on admission) Patient on sertraline and escitalopram.   Continue with clonazepam.   Hypomagnesemia- (present on admission) Hypokalemia. Hyponatremia.  Patient with Na 132, k is 4,1 and serum cr at 0,85 with bicarbonate 29.   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  Hold on Kcl for now and follow electrolytes in am.   Hypokalemia-resolved as of 02/16/2021, (present on admission) K has been corrected to 4,1  Renal function stable with serum cr at 0,79.         Subjective: patient continue to have dyspnea on exertion, no nausea or vomiting, no chest pain   Physical Exam: Vitals:   02/17/21 0043 02/17/21 0523 02/17/21 0755 02/17/21 0837  BP: 97/71 127/87 (!) 138/94 (!) 138/94  Pulse: (!) 122  (!) 116 (!) 125  Resp: 17 20 18    Temp: 98.1 F (36.7 C) 98 F (36.7 C) 97.8 F (36.6 C)   TempSrc: Oral Oral Oral   SpO2: 98% 95% 95%   Weight:  79.8 kg    Height:       Neurology awake and alert ENT with no pallor Cardiovascular with S1 and S2 present and tachycardic, regular rhythm, with no gallop or murmurs. Positive moderate JVD Pitting bilateral extremity edema +  Abdomen is soft and non tender Data Reviewed:    Family Communication: I spoke with patient's daughter at the bedside, we talked in detail about patient's condition, plan of care and prognosis and all questions were addressed.   Disposition: Status is: Inpatient Remains inpatient appropriate because: uncontrolled atrial flutter.       Planned Discharge Destination: Home     Author: Tawni Millers, MD 02/17/2021 3:48 PM  For on call review [email or web address].

## 2021-02-17 NOTE — Progress Notes (Signed)
Physical Therapy Treatment Patient Details Name: Patricia [name] MRN: [id number] DOB: 1950-01-15 Today's Date: 02/17/2021   History of Present Illness 71 y.o. female admitted 1/24 with DOE and tachycardia. Pt with acute on chronic respiratory failure and CHF. 2/6 DCCV. PMHx: HTN, back surgery, ORIF radius and ulna fx, Lung CA with thrombocytopenia, Afib.    PT Comments    Pt progressing well with gait with improved posture for increased periods and improved distance. Pt able to perform sit to stands without hands with increased struggle and verbalized HEP and goals for home. HR maintained 120-122 with activity with sats 93-97% on 2L    Recommendations for follow up therapy are one component of a multi-disciplinary discharge planning process, led by the attending physician.  Recommendations may be updated based on patient status, additional functional criteria and insurance authorization.  Follow Up Recommendations  Home health PT     Assistance Recommended at Discharge PRN  Patient can return home with the following Assist for transportation;Assistance with cooking/housework;A little help with bathing/dressing/bathroom   Equipment Recommendations  None recommended by PT    Recommendations for Other Services       Precautions / Restrictions Precautions Precautions: Fall Precaution Comments: watch sats and HR     Mobility  Bed Mobility               General bed mobility comments: sitting EOB on arrival    Transfers Overall transfer level: Needs assistance   Transfers: Sit to/from Stand Sit to Stand: Supervision           General transfer comment: supervision for lines. good sequence standing from bed and able to perform repeated sit to stands from recliner without use of hands with increased struggle and time require 2-3 attempts prior to achieving each stand without use of hands    Ambulation/Gait Ambulation/Gait assistance: Supervision Gait Distance  (Feet): 300 Feet Assistive device: Rolling walker (2 wheels) Gait Pattern/deviations: Step-through pattern, Trunk flexed   Gait velocity interpretation: 1.31 - 2.62 ft/sec, indicative of limited community ambulator   General Gait Details: cues for posture and position in RW with pt able to self regulate distance. Pt maintaining appropriate posture for increased periods of time with RW this session. Sats 93-97% on RA throughout gait with HR 120. Pt able to walk 3' without UE support   Stairs             Wheelchair Mobility    Modified Rankin (Stroke Patients Only)       Balance Overall balance assessment: Mild deficits observed, not formally tested                                          Cognition Arousal/Alertness: Awake/alert Behavior During Therapy: WFL for tasks assessed/performed Overall Cognitive Status: Within Functional Limits for tasks assessed                                          Exercises General Exercises - Lower Extremity Long Arc Quad: AROM, Both, Seated, 20 reps Hip Flexion/Marching: AROM, Both, Seated, 20 reps    General Comments        Pertinent Vitals/Pain Pain Assessment Pain Assessment: No/denies pain    Home Living  Prior Function            PT Goals (current goals can now be found in the care plan section) Progress towards PT goals: Progressing toward goals    Frequency    Min 3X/week      PT Plan Current plan remains appropriate    Co-evaluation              AM-PAC PT "6 Clicks" Mobility   Outcome Measure  Help needed turning from your back to your side while in a flat bed without using bedrails?: None Help needed moving from lying on your back to sitting on the side of a flat bed without using bedrails?: None Help needed moving to and from a bed to a chair (including a wheelchair)?: A Little Help needed standing up from a chair using your  arms (e.g., wheelchair or bedside chair)?: A Little Help needed to walk in hospital room?: A Little Help needed climbing 3-5 steps with a railing? : A Little 6 Click Score: 20    End of Session Equipment Utilized During Treatment: Oxygen Activity Tolerance: Patient tolerated treatment well Patient left: in chair;with call bell/phone within reach Nurse Communication: Mobility status PT Visit Diagnosis: Other abnormalities of gait and mobility (R26.89);Muscle weakness (generalized) (M62.81)     Time: 6812-7517 PT Time Calculation (min) (ACUTE ONLY): 20 min  Charges:  $Gait Training: 8-22 mins                     Bayard Males, PT Acute Rehabilitation Services Pager: [phone] Office: [place] 02/17/2021, 10:28 AM

## 2021-02-17 NOTE — Progress Notes (Signed)
Progress Note  Patient Name: Patricia [name] Date of Encounter: 02/17/2021  [facility] Cardiologist: Will Meredith Leeds, MD   Subjective   Mild dyspnea; no CP  Inpatient Medications    Scheduled Meds:  amiodarone  400 mg Oral BID   apixaban  5 mg Oral BID   carisoprodol  350 mg Oral TID   clonazePAM  1 mg Oral QHS   escitalopram  10 mg Oral Daily   furosemide  40 mg Oral Daily   gabapentin  600 mg Oral BID   ipratropium  0.5 mg Nebulization BID   linaclotide  145 mcg Oral q1600   magnesium oxide  400 mg Oral Daily   metoprolol tartrate  100 mg Oral BID   pantoprazole  40 mg Oral Daily   polyethylene glycol  34 g Oral q AM   polyvinyl alcohol  1 drop Both Eyes QHS   potassium chloride  20 mEq Oral Daily   rosuvastatin  5 mg Oral Once per day on Mon Wed Fri   sodium chloride flush  3 mL Intravenous Q12H   Continuous Infusions:  sodium chloride Stopped (02/16/21 1415)   PRN Meds: acetaminophen **OR** acetaminophen, docusate sodium, fluticasone, guaiFENesin-dextromethorphan, levalbuterol, magic mouthwash, nystatin, ondansetron **OR** ondansetron (ZOFRAN) IV, polyvinyl alcohol, traMADol, zolpidem   Vital Signs    Vitals:   02/17/21 0043 02/17/21 0523 02/17/21 0755 02/17/21 0837  BP: 97/71 127/87 (!) 138/94 (!) 138/94  Pulse: (!) 122  (!) 116 (!) 125  Resp: 17 20 18    Temp: 98.1 F (36.7 C) 98 F (36.7 C) 97.8 F (36.6 C)   TempSrc: Oral Oral Oral   SpO2: 98% 95% 95%   Weight:  79.8 kg    Height:        Intake/Output Summary (Last 24 hours) at 02/17/2021 0901 Last data filed at 02/17/2021 0500 Gross per 24 hour  Intake 200 ml  Output 1200 ml  Net -1000 ml    Last 3 Weights 02/17/2021 02/16/2021 02/15/2021  Weight (lbs) 175 lb 14.4 oz 177 lb 14.4 oz 178 lb 1.6 oz  Weight (kg) 79.788 kg 80.695 kg 80.786 kg      Telemetry    Atrial flutter with elevated rate- Personally Reviewed  Physical Exam   GEN: NAD Neck: supple Cardiac: regular and  tachycardic Respiratory: CTA; no rhonchi GI: Soft, NT MS: No edema Neuro: Grossly intact Psych: Normal affect   Labs    High Sensitivity Troponin:   Recent Labs  Lab 02/11/21 0719 02/11/21 0925  TROPONINIHS 12 10      Chemistry Recent Labs  Lab 02/13/21 0736 02/14/21 0817 02/15/21 0450 02/16/21 0536 02/17/21 0623  NA 137   < > 135 136 132*  K 4.0   < > 4.1 4.3 4.1  CL 94*   < > 93* 96* 94*  CO2 33*   < > 31 32 29  GLUCOSE 114*   < > 92 101* 105*  BUN 10   < > 16 14 12   CREATININE 0.80   < > 0.79 0.72 0.85  CALCIUM 8.6*   < > 8.4* 8.7* 8.7*  MG 1.8  --   --  1.7 1.8  GFRNONAA >60   < > >60 >60 >60  ANIONGAP 10   < > 11 8 9    < > = values in this interval not displayed.      Hematology Recent Labs  Lab 02/12/21 9528 02/13/21 0736 02/15/21 0450  WBC 2.8* 2.5*  2.7*  RBC 3.25* 3.26* 3.01*  HGB 10.9* 11.0* 10.1*  HCT 31.4* 31.9* 30.1*  MCV 96.6 97.9 100.0  MCH 33.5 33.7 33.6  MCHC 34.7 34.5 33.6  RDW 23.2* 22.9* 22.8*  PLT 56* 64* 78*     BNP Recent Labs  Lab 02/11/21 0724  BNP 420.9*       Patient Profile     71 y.o. female with past medical history of small cell lung cancer, chronic diastolic congestive heart failure paroxysmal atrial fibrillation admitted with atrial flutter.  Underwent successful cardioversion on February 6 but atrial flutter has recurred.  Assessment & Plan    1 atrial flutter-patient remains in atrial flutter. Heart rate is elevated.  Continue metoprolol 100 mg twice daily.  Add digoxin 0.125 mg daily.  Continue amiodarone 400 mg twice daily for 1 week then 200 mg daily thereafter.  If heart rate can be controlled she could be discharged with plans for repeat cardioversion in 4 to 6 weeks after amiodarone fully in her system.  If heart rate continues to be difficult to control would likely try repeat cardioversion early next week.  Hopefully the more amiodarone in her system the more likely she is to hold sinus rhythm.   Continue apixaban.   2 acute on chronic diastolic ingestive heart failure-remains euvolemic on examination.  Continue Lasix and potassium at present dose.  3 small cell lung cancer-treatment associated with neutropenia and thrombocytopenia; managed by oncology.  For questions or updates, please contact [place] Please consult [email or web address] for contact info under        Signed, Kirk Ruths, MD  02/17/2021, 9:01 AM

## 2021-02-18 DIAGNOSIS — J9621 Acute and chronic respiratory failure with hypoxia: Secondary | ICD-10-CM | POA: Diagnosis not present

## 2021-02-18 DIAGNOSIS — I509 Heart failure, unspecified: Secondary | ICD-10-CM

## 2021-02-18 DIAGNOSIS — I4892 Unspecified atrial flutter: Secondary | ICD-10-CM | POA: Diagnosis not present

## 2021-02-18 LAB — BASIC METABOLIC PANEL
Anion gap: 9 (ref 5–15)
BUN: 28 mg/dL — ABNORMAL HIGH (ref 8–23)
CO2: 34 mmol/L — ABNORMAL HIGH (ref 22–32)
Calcium: 8.9 mg/dL (ref 8.9–10.3)
Chloride: 92 mmol/L — ABNORMAL LOW (ref 98–111)
Creatinine, Ser: 0.98 mg/dL (ref 0.44–1.00)
GFR, Estimated: >60 mL/min (ref >60)
Glucose, Bld: 105 mg/dL — ABNORMAL HIGH (ref 70–99)
Potassium: 3.8 mmol/L (ref 3.5–5.1)
Sodium: 135 mmol/L (ref 135–145)

## 2021-02-18 MED ORDER — FUROSEMIDE 40 MG PO TABS: 40.0000 mg | ORAL_TABLET | Freq: Every day | ORAL | Status: DC

## 2021-02-18 MED ORDER — UREA 15 G PO PACK
15.0000 g | PACK | Freq: Every day | ORAL | Status: DC
  Filled 2021-02-18: qty 1

## 2021-02-18 NOTE — Progress Notes (Signed)
Progress Note   Patient: Patricia [name] FUX:[id number] DOB: 07/08/1950 DOA: 02/11/2021     7 DOS: the patient was seen and examined on 02/18/2021   Brief hospital course: Patricia [name] was admitted to the hospital with the working diagnosis of acute heart failure decompensation, complicated with atrial flutter with rapid ventricular response.   71 yo female with the past medical history of HTN, dyslipidemia, atrial fibrillation and flutter, diastolic heart failure and small cell lung cancer who presented with dyspnea. Patient reported progressive dyspnea for 2 months but more intense over 7 days. Recently completed chemotherapy 2 weeks ago. At 4 am on the day of admission she acute severe dyspnea, that woke her up, with wheezing and cough. On her initial physical examination her heart rate was 143, RR 18 to 33, oxygen saturation 85%, her lungs had rales but no wheezing, positive increased work of breathing, heart with S1 and S2 present and tachycardic, irregularly irregular, abdomen soft and positive lower extremity edema.   Na 137, K 4,0 Cl 99, bicarbonate 31, glucose 115 bun 14 and cr 0,86 Mag 1,3  High sensitive troponin 12  BNP 420  Wbc 6,1, hgb 10,6 hct 31,2 and plt 53   Sars covid 19 negative   Chest radiograph with bilateral interstitial infiltrates predominantly at the lower lobes with hilar vascular congestion.   CT chest with bilateral pleural effusions and ground glass opacities. No pulmonary embolism.   EKG 142 bpm, with normal axis and normal qtc, atrial flutter rhythm, with no significant ST segment or T wave changes.   Patient was placed on amiodarone infusion and diuresed with furosemide.  Underwent cardioversion with good toleration.   02/08 patient converted back to atrial flutter with rapid ventricular response. Increased metoprolol to 100 mg.   02/10 continue with RVR, added digoxin. Plan for second electrical cardioversion on 02/21/20.    Assessment and Plan: *  Acute on chronic respiratory failure with hypoxia (HCC) - (present on admission) Dyspnea has improved, documented oxygenation today is 95% on room air.  Continue oxymetry monitoring, patient is on supplemental home 02 at home.    Acute on chronic diastolic CHF (congestive heart failure) ([place])- (present on admission) Echocardiogram with EF LV 45 to 50%, mild enlargement of the RV, estimated pulmonary systolic pressure 02,5 mmHg.   Patient with improvement in volume status today, her urine output over last 24 hrs has been 900.  Patient continue with atrial flutter with rapid ventricular response.  Plan to continue metoprolol and oral furosemide.    Atrial flutter with rapid ventricular response ([place])- (present on admission) sp cardioversion on this hospitalization.   Persistent atrial flutter rate in the 120's, blood pressure systolic 427'C.   Continue with metoprolol, amiodarone and digoxin.  Anticoagulation with apixaban.  Plan for cardioversion on Monday.    SIADH (syndrome of inappropriate ADH production) ([place])- (present on admission) Hypokalemia. Hyponatremia, hypomagnesemia.  Patient has been taking NA tablets as outpatient since 10/2020.  Na is 135, with K at 3,8 and serum bicarbonate 34, with bun 28 and cr 0,98. Changed from Na tablets to urea in the setting of heart failure.   Antineoplastic chemotherapy induced pancytopenia ([place])- (present on admission) Follow up cell count as outpatient.   Oral thrush continue with nystatin.   Dyslipidemia- (present on admission) On statin therapy.   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 Julien [name] of  oncology - completed chemotherapy treatments of cisplatin and etoposide approximately 2 weeks ago.,  Also completed lung radiation, plans for prophylactic whole brain radiation in the near future.   Outpatient oncology follow up.    Anxiety- (present on admission) Continue with sertraline and escitalopram.  Continue with clonazepam.   Hypokalemia-resolved as of 02/16/2021, (present on admission) K has been corrected to 4,1  Renal function stable with serum cr at 0,79.         Subjective: Patient with improvement on her dyspnea on exertion. No chest pain or palpitations.   Physical Exam: Vitals:   02/18/21 0531 02/18/21 0826 02/18/21 0845 02/18/21 0906  BP: 100/82 110/72 110/72   Pulse: (!) 125 (!) 128 (!) 128   Resp: 17 17    Temp: 98.1 F (36.7 C) 98.5 F (36.9 C)    TempSrc: Oral Oral    SpO2: 98% 95%  95%  Weight: 81.1 kg     Height:       Neurology. Awake and alert ENT mild pallor Cardiovascular heart with S1 and S2 tachycardic, irregular, no murmurs, no gallops. No JVD No lower extremity edema Respiratory with no wheezing or rales Abdomen soft and non tender  Data Reviewed:    Family Communication: no family at the beside   Disposition: Status is: Inpatient Remains inpatient appropriate because: uncontrolled atrial flutter      Planned Discharge Destination: Home     Author: Tawni Millers, MD 02/18/2021 9:15 AM  For on call review [email or web address].

## 2021-02-18 NOTE — Progress Notes (Signed)
°   02/18/21 1130  Assess: MEWS Score  Temp 98.5 F (36.9 C)  BP (!) 75/58  Pulse Rate (!) 128  ECG Heart Rate (!) 128  Resp 15  SpO2 97 %  O2 Device Nasal Cannula  Assess: MEWS Score  MEWS Temp 0  MEWS Systolic 2  MEWS Pulse 2  MEWS RR 0  MEWS LOC 0  MEWS Score 4  MEWS Score Color Red  Assess: if the MEWS score is Yellow or Red  Were vital signs taken at a resting state? Yes  Focused Assessment Change from prior assessment (see assessment flowsheet)  Early Detection of Sepsis Score *See Row Information* Medium  MEWS guidelines implemented *See Row Information* Yes  Treat  MEWS Interventions Escalated (See documentation below)  Pain Scale 0-10  Pain Score 0  Take Vital Signs  Increase Vital Sign Frequency  Red: Q 1hr X 4 then Q 4hr X 4, if remains red, continue Q 4hrs  Escalate  MEWS: Escalate Red: discuss with charge nurse/RN and provider, consider discussing with RRT  Notify: Charge Nurse/RN  Name of Charge Nurse/RN Notified Leanor Rubenstein, RN  Date Charge Nurse/RN Notified 02/18/21  Time Charge Nurse/RN Notified [id number]  Notify: Provider  Provider Name/Title Judene Companion  Date Provider Notified 02/18/21  Time Provider Notified 1148  Notification Type Call (Message in Epic)  Notification Reason Change in status  Provider response Evaluate remotely;Other (Comment);See new orders (lasix frequency changed to once daily)  Date of Provider Response 02/18/21

## 2021-02-18 NOTE — Progress Notes (Signed)
Progress Note  Patient Name: Patricia [name] Date of Encounter: 02/18/2021  [facility] Cardiologist: Will Meredith Leeds, MD   Subjective  Shortness of breath continues to improve.  She denies any chest pain  Inpatient Medications    Scheduled Meds:  amiodarone  400 mg Oral BID   apixaban  5 mg Oral BID   carisoprodol  350 mg Oral TID   clonazePAM  1 mg Oral QHS   digoxin  0.125 mg Oral Daily   escitalopram  10 mg Oral Daily   furosemide  40 mg Oral BID   gabapentin  600 mg Oral BID   ipratropium  0.5 mg Nebulization BID   linaclotide  145 mcg Oral q1600   magnesium oxide  400 mg Oral Daily   metoprolol tartrate  100 mg Oral BID   pantoprazole  40 mg Oral Daily   polyethylene glycol  34 g Oral q AM   polyvinyl alcohol  1 drop Both Eyes QHS   rosuvastatin  5 mg Oral Once per day on Mon Wed Fri   sodium chloride flush  3 mL Intravenous Q12H   urea  15 g Oral BID   Continuous Infusions:  sodium chloride Stopped (02/16/21 1415)   PRN Meds: acetaminophen **OR** acetaminophen, docusate sodium, fluticasone, guaiFENesin-dextromethorphan, levalbuterol, magic mouthwash, nystatin, ondansetron **OR** ondansetron (ZOFRAN) IV, polyvinyl alcohol, traMADol, zolpidem   Vital Signs    Vitals:   02/17/21 1952 02/18/21 0016 02/18/21 0531 02/18/21 0826  BP: 125/79 100/75 100/82 110/72  Pulse: (!) 125 (!) 123 (!) 125 (!) 128  Resp: (!) 22 18 17 17   Temp: 98 F (36.7 C) 97.6 F (36.4 C) 98.1 F (36.7 C) 98.5 F (36.9 C)  TempSrc: Oral Oral Oral Oral  SpO2: 99% 97% 98% 95%  Weight:   81.1 kg   Height:        Intake/Output Summary (Last 24 hours) at 02/18/2021 0836 Last data filed at 02/18/2021 0549 Gross per 24 hour  Intake 240 ml  Output 900 ml  Net -660 ml    Last 3 Weights 02/18/2021 02/17/2021 02/16/2021  Weight (lbs) 178 lb 12.7 oz 175 lb 14.4 oz 177 lb 14.4 oz  Weight (kg) 81.1 kg 79.788 kg 80.695 kg      Telemetry    Atrial flutter with controlled ventricular  response- Personally Reviewed  Physical Exam   GEN: Well nourished, well developed in no acute distress HEENT: Normal NECK: No JVD; No carotid bruits LYMPHATICS: No lymphadenopathy CARDIAC: Tachycardic no murmurs, rubs, gallops RESPIRATORY:  Clear to auscultation without rales, wheezing or rhonchi  ABDOMEN: Soft, non-tender, non-distended MUSCULOSKELETAL:  No edema; No deformity  SKIN: Warm and dry NEUROLOGIC:  Alert and oriented x 3 PSYCHIATRIC:  Normal affect   Labs    High Sensitivity Troponin:   Recent Labs  Lab 02/11/21 0719 02/11/21 0925  TROPONINIHS 12 10      Chemistry Recent Labs  Lab 02/13/21 0736 02/14/21 0817 02/16/21 0536 02/17/21 0623 02/18/21 0604  NA 137   < > 136 132* 135  K 4.0   < > 4.3 4.1 3.8  CL 94*   < > 96* 94* 92*  CO2 33*   < > 32 29 34*  GLUCOSE 114*   < > 101* 105* 105*  BUN 10   < > 14 12 28*  CREATININE 0.80   < > 0.72 0.85 0.98  CALCIUM 8.6*   < > 8.7* 8.7* 8.9  MG 1.8  --  1.7 1.8  --   GFRNONAA >60   < > >60 >60 >60  ANIONGAP 10   < > 8 9 9    < > = values in this interval not displayed.      Hematology Recent Labs  Lab 02/12/21 [phone] 02/13/21 0736 02/15/21 0450  WBC 2.8* 2.5* 2.7*  RBC 3.25* 3.26* 3.01*  HGB 10.9* 11.0* 10.1*  HCT 31.4* 31.9* 30.1*  MCV 96.6 97.9 100.0  MCH 33.5 33.7 33.6  MCHC 34.7 34.5 33.6  RDW 23.2* 22.9* 22.8*  PLT 56* 64* 78*     BNP No results for input(s): BNP, PROBNP in the last 168 hours.     Patient Profile     71 y.o. female with past medical history of small cell lung cancer, chronic diastolic congestive heart failure paroxysmal atrial fibrillation admitted with atrial flutter.  Underwent successful cardioversion on February 6 but atrial flutter has recurred.  Assessment & Plan    1 atrial flutter -She remains in atrial flutter this morning with heart rates in the 120s  -conContinue Lopressor tartrate 100 mg twice daily, digoxin 0.125 mg daily and amiodarone 400 mg twice daily  for 1 week then decrease to 200 mg daily  -Cannot push metoprolol further due to soft blood pressure still short of breath when ambulating -Continue apixaban 5 mg twice daily -Heart rate remains poorly controlled and I do not think organ to get a better without repeat cardioversion -We will plan to make n.p.o. after midnight on Sunday night with cardioversion on Monday  2 acute on chronic combined systolic/diastolic heart failure -She appears euvolemic on exam today -Continue Lasix 40 mg twice daily -Serum creatinine stable at 0.98 and potassium 3.8  3 small cell lung cancer -treatment associated with neutropenia and thrombocytopenia; managed by oncology.  I have spent a total of 30 minutes with patient reviewing 2D echo, telemetry, EKGs, labs and examining patient as well as establishing an assessment and plan that was discussed with the patient.  > 50% of time was spent in direct patient care.     For questions or updates, please contact [place] Please consult [email or web address] for contact info under        Signed, Fransico Him, MD  02/18/2021, 8:36 AM

## 2021-02-19 DIAGNOSIS — I509 Heart failure, unspecified: Secondary | ICD-10-CM | POA: Diagnosis not present

## 2021-02-19 DIAGNOSIS — I4892 Unspecified atrial flutter: Secondary | ICD-10-CM | POA: Diagnosis not present

## 2021-02-19 DIAGNOSIS — J9621 Acute and chronic respiratory failure with hypoxia: Secondary | ICD-10-CM | POA: Diagnosis not present

## 2021-02-19 LAB — BASIC METABOLIC PANEL
Anion gap: 8 (ref 5–15)
BUN: 25 mg/dL — ABNORMAL HIGH (ref 8–23)
CO2: 32 mmol/L (ref 22–32)
Calcium: 8.6 mg/dL — ABNORMAL LOW (ref 8.9–10.3)
Chloride: 94 mmol/L — ABNORMAL LOW (ref 98–111)
Creatinine, Ser: 1.15 mg/dL — ABNORMAL HIGH (ref 0.44–1.00)
GFR, Estimated: 51 mL/min — ABNORMAL LOW (ref >60)
Glucose, Bld: 105 mg/dL — ABNORMAL HIGH (ref 70–99)
Potassium: 4.2 mmol/L (ref 3.5–5.1)
Sodium: 134 mmol/L — ABNORMAL LOW (ref 135–145)

## 2021-02-19 LAB — MAGNESIUM: Magnesium: 1.8 mg/dL (ref 1.7–2.4)

## 2021-02-19 MED ORDER — FUROSEMIDE 40 MG PO TABS
40.0000 mg | ORAL_TABLET | Freq: Every day | ORAL | Status: DC
  Administered 2021-02-20 – 2021-02-21 (×2): 40 mg via ORAL
  Filled 2021-02-19 (×2): qty 1

## 2021-02-19 MED ORDER — SODIUM CHLORIDE 1 G PO TABS
1.0000 g | ORAL_TABLET | Freq: Two times a day (BID) | ORAL | Status: DC
  Administered 2021-02-19 – 2021-02-21 (×4): 1 g via ORAL
  Filled 2021-02-19 (×5): qty 1

## 2021-02-19 NOTE — Progress Notes (Signed)
Mobility Specialist Progress Note:   02/19/21 1430  Mobility  Activity Ambulated with assistance in hallway  Level of Assistance Standby assist, set-up cues, supervision of patient - no hands on  Assistive Device Front wheel walker  Distance Ambulated (ft) 280 ft  Activity Response Tolerated well  $Mobility charge 1 Mobility    Pre Mobility: HR 117bpm During Mobility: HR 130bpm Post Mobility: HR 124bpm   Pt agreed to mobility session this afternoon. No physical assist required throughout session. Pt c/o SOB at end of session, left in chair with all needs met.   Nelta Numbers Mobility Specialist  Phone: [phone]

## 2021-02-19 NOTE — Progress Notes (Signed)
Progress Note   Patient: Patricia [name] GQQ:[id number] DOB: 03-20-1950 DOA: 02/11/2021     8 DOS: the patient was seen and examined on 02/19/2021   Brief hospital course: Patricia [name] was admitted to the hospital with the working diagnosis of acute heart failure decompensation, complicated with atrial flutter with rapid ventricular response.   71 yo female with the past medical history of HTN, dyslipidemia, atrial fibrillation and flutter, diastolic heart failure and small cell lung cancer who presented with dyspnea. Patient reported progressive dyspnea for 2 months but more intense over 7 days. Recently completed chemotherapy 2 weeks ago. At 4 am on the day of admission she acute severe dyspnea, that woke her up, with wheezing and cough. On her initial physical examination her heart rate was 143, RR 18 to 33, oxygen saturation 85%, her lungs had rales but no wheezing, positive increased work of breathing, heart with S1 and S2 present and tachycardic, irregularly irregular, abdomen soft and positive lower extremity edema.   Na 137, K 4,0 Cl 99, bicarbonate 31, glucose 115 bun 14 and cr 0,86 Mag 1,3  High sensitive troponin 12  BNP 420  Wbc 6,1, hgb 10,6 hct 31,2 and plt 53   Sars covid 19 negative   Chest radiograph with bilateral interstitial infiltrates predominantly at the lower lobes with hilar vascular congestion.   CT chest with bilateral pleural effusions and ground glass opacities. No pulmonary embolism.   EKG 142 bpm, with normal axis and normal qtc, atrial flutter rhythm, with no significant ST segment or T wave changes.   Patient was placed on amiodarone infusion and diuresed with furosemide.  Underwent cardioversion with good toleration.   02/08 patient converted back to atrial flutter with rapid ventricular response. Increased metoprolol to 100 mg.   02/10 continue with RVR, added digoxin. Plan for second electrical cardioversion on 02/21/20.    Assessment and Plan: *  Atrial flutter with rapid ventricular response ([place])- (present on admission) sp cardioversion on this hospitalization.   Persistent atrial flutter rate in the 120's. She had hypotension yesterday down to 86 systolic.  Patient on metoprolol, amiodarone and digoxin, with uncontrolled atrial flutter with RVR. Plan for DC cardioversion in am Patient will continue anticoagulation with apixaban.   Acute on chronic diastolic CHF (congestive heart failure) ([place])- (present on admission) Echocardiogram with EF LV 45 to 50%, mild enlargement of the RV, estimated pulmonary systolic pressure 67,1 mmHg.   Patient is euvolemic today. She had hypotension yesterday.  Plan to continue furosemide po and rate control for atrial flutter.  Close follow up on blood pressure and volume status.    Acute on chronic respiratory failure with hypoxia (HCC) - (present on admission) Continue supplemental 02 per [place].  At home patient on supplemental 02 per , plan to keep 02 saturation 88% or greater.    SIADH (syndrome of inappropriate ADH production) ([place])- (present on admission) Hypokalemia. Hyponatremia, hypomagnesemia.  Unfortunately patient did not tolerate urea po  Her serum NA today is 134 with K at 4,2 and serum cr at 1,15 Plan to change back to salt tablets and follow up electrolytes in am.  Positive AKI due to over -diuresis. Continue close follow up on renal function, decreased dose of furosemide.   Antineoplastic chemotherapy induced pancytopenia ([place])- (present on admission) Follow up cell count as outpatient.   Oral thrush continue with nystatin.   Dyslipidemia- (present on admission) Continue with statin therapy .   Primary small cell carcinoma of lower lobe of  left lung ([place])- (present on admission) Stage III A small cell lung cancer  - Followed by Dr. Julien Nordmann of oncology - completed chemotherapy treatments of cisplatin and etoposide approximately 2 weeks ago.,  Also completed lung  radiation, plans for prophylactic whole brain radiation in the near future.   Outpatient oncology follow up.   Anxiety- (present on admission) On sertraline and escitalopram.  Continue with clonazepam.   Hypokalemia-resolved as of 02/16/2021, (present on admission) K has been corrected to 4,1  Renal function stable with serum cr at 0,79.         Subjective: Patient with no palpitations or chest pain, no dyspnea at rest. Continue with atrial flutter rhythm with RVR  Physical Exam: Vitals:   02/18/21 2336 02/19/21 0336 02/19/21 0801 02/19/21 1019  BP: 92/62 96/70 105/81 105/79  Pulse: (!) 121 (!) 120 (!) 125 (!) 127  Resp: 20 17 18    Temp: 97.7 F (36.5 C) 97.9 F (36.6 C) 98.5 F (36.9 C)   TempSrc: Axillary Oral Oral   SpO2: 96% 98% 98%   Weight:  81 kg    Height:       Neurology awake and alert ENT mild pallor Cardiovascular with S1 and S2 tachycardic, regular with no murmurs or gallops No JVD No lower extremity edema Respiratory with no wheezing, rales or rhonchi Abdomen soft and non tender  Data Reviewed:    Family Communication: no family at the bedside.   Disposition: Status is: Inpatient Remains inpatient appropriate because: uncontrolled atrial flutter.      Planned Discharge Destination: Home    Author: Tawni Millers, MD 02/19/2021 10:35 AM  For on call review [email or web address].

## 2021-02-19 NOTE — H&P (View-Only) (Signed)
Progress Note  Patient Name: Patricia [name] Date of Encounter: 02/19/2021  [facility] Cardiologist: Will Meredith Leeds, MD   Subjective  She has not had any further chest pain.  Still short of breath when ambulating but improving  Inpatient Medications    Scheduled Meds:  amiodarone  400 mg Oral BID   apixaban  5 mg Oral BID   carisoprodol  350 mg Oral TID   clonazePAM  1 mg Oral QHS   digoxin  0.125 mg Oral Daily   escitalopram  10 mg Oral Daily   furosemide  40 mg Oral Daily   gabapentin  600 mg Oral BID   ipratropium  0.5 mg Nebulization BID   linaclotide  145 mcg Oral q1600   magnesium oxide  400 mg Oral Daily   metoprolol tartrate  100 mg Oral BID   pantoprazole  40 mg Oral Daily   polyethylene glycol  34 g Oral q AM   polyvinyl alcohol  1 drop Both Eyes QHS   rosuvastatin  5 mg Oral Once per day on Mon Wed Fri   sodium chloride flush  3 mL Intravenous Q12H   urea  15 g Oral Daily   Continuous Infusions:  sodium chloride Stopped (02/16/21 1415)   PRN Meds: acetaminophen **OR** acetaminophen, docusate sodium, fluticasone, guaiFENesin-dextromethorphan, levalbuterol, magic mouthwash, nystatin, ondansetron **OR** ondansetron (ZOFRAN) IV, polyvinyl alcohol, traMADol, zolpidem   Vital Signs    Vitals:   02/18/21 2135 02/18/21 2336 02/19/21 0336 02/19/21 0801  BP: 90/60 92/62 96/70  105/81  Pulse: (!) 126 (!) 121 (!) 120 (!) 125  Resp:  20 17 18   Temp:  97.7 F (36.5 C) 97.9 F (36.6 C) 98.5 F (36.9 C)  TempSrc:  Axillary Oral Oral  SpO2:  96% 98% 98%  Weight:   81 kg   Height:       No intake or output data in the 24 hours ending 02/19/21 0828  Last 3 Weights 02/19/2021 02/18/2021 02/17/2021  Weight (lbs) 178 lb 9.2 oz 178 lb 12.7 oz 175 lb 14.4 oz  Weight (kg) 81 kg 81.1 kg 79.788 kg      Telemetry    Atrial flutter with RVR- Personally Reviewed  Physical Exam   GEN: Well nourished, well developed in no acute distress HEENT: Normal NECK:  No JVD; No carotid bruits LYMPHATICS: No lymphadenopathy CARDIAC: Tachycardic, no murmurs, rubs, gallops RESPIRATORY:  Clear to auscultation without rales, wheezing or rhonchi  ABDOMEN: Soft, non-tender, non-distended MUSCULOSKELETAL:  No edema; No deformity  SKIN: Warm and dry NEUROLOGIC:  Alert and oriented x 3 PSYCHIATRIC:  Normal affect   Labs    High Sensitivity Troponin:   Recent Labs  Lab 02/11/21 0719 02/11/21 0925  TROPONINIHS 12 10      Chemistry Recent Labs  Lab 02/16/21 0536 02/17/21 0623 02/18/21 0604 02/19/21 0607  NA 136 132* 135 134*  K 4.3 4.1 3.8 4.2  CL 96* 94* 92* 94*  CO2 32 29 34* 32  GLUCOSE 101* 105* 105* 105*  BUN 14 12 28* 25*  CREATININE 0.72 0.85 0.98 1.15*  CALCIUM 8.7* 8.7* 8.9 8.6*  MG 1.7 1.8  --  1.8  GFRNONAA >60 >60 >60 51*  ANIONGAP 8 9 9 8       Hematology Recent Labs  Lab 02/13/21 0736 02/15/21 0450  WBC 2.5* 2.7*  RBC 3.26* 3.01*  HGB 11.0* 10.1*  HCT 31.9* 30.1*  MCV 97.9 100.0  MCH 33.7 33.6  MCHC 34.5  33.6  RDW 22.9* 22.8*  PLT 64* 78*     BNP No results for input(s): BNP, PROBNP in the last 168 hours.     Patient Profile     71 y.o. female with past medical history of small cell lung cancer, chronic diastolic congestive heart failure paroxysmal atrial fibrillation admitted with atrial flutter.  Underwent successful cardioversion on February 6 but atrial flutter has recurred.  Assessment & Plan    1 atrial flutter -Status post successful cardioversion to sinus rhythm on 02/13/2021 but had early reversion back to atrial flutter with RVR  -heart rate control has been difficult and she still remains in atrial flutter with RVR in the 120s -Continue Lopressor 100 mg twice daily, digoxin 0.125 mg daily, apixaban 5 mg twice daily and amiodarone 400 mg twice daily for 1 week then 200 mg after.. -Cannot push metoprolol further due to soft blood pressure  -She has not missed any doses of Eliquis in the past 4  weeks -Since we have not been able to get her rate controlled we will make her n.p.o. after midnight for DCCV in a.m.  -Shared Decision Making/Informed Consent The risks (stroke, cardiac arrhythmias rarely resulting in the need for a temporary or permanent pacemaker, skin irritation or burns and complications associated with conscious sedation including aspiration, arrhythmia, respiratory failure and death), benefits (restoration of normal sinus rhythm) and alternatives of a direct current cardioversion were explained in detail to Ms. Mizner and she agrees to proceed.    2 acute on chronic combined systolic/diastolic heart failure -I's and O's are incomplete -She does not appear volume overloaded on exam today -Labs today so serum creatinine 1.15 and potassium 4.2. -BP is running soft and serum creatinine has bumped so we will hold Lasix today and start 40 mg daily tomorrow  3 small cell lung cancer -treatment associated with neutropenia and thrombocytopenia; managed by oncology.  I have spent a total of 30 minutes with patient reviewing 2D echo, telemetry, EKGs, labs and examining patient as well as establishing an assessment and plan that was discussed with the patient.  > 50% of time was spent in direct patient care.     For questions or updates, please contact [place] Please consult [email or web address] for contact info under        Signed, Fransico Him, MD  02/19/2021, 8:28 AM

## 2021-02-19 NOTE — Progress Notes (Signed)
Progress Note  Patient Name: Patricia [name] Date of Encounter: 02/19/2021  [facility] Cardiologist: Will Meredith Leeds, MD   Subjective  She has not had any further chest pain.  Still short of breath when ambulating but improving  Inpatient Medications    Scheduled Meds:  amiodarone  400 mg Oral BID   apixaban  5 mg Oral BID   carisoprodol  350 mg Oral TID   clonazePAM  1 mg Oral QHS   digoxin  0.125 mg Oral Daily   escitalopram  10 mg Oral Daily   furosemide  40 mg Oral Daily   gabapentin  600 mg Oral BID   ipratropium  0.5 mg Nebulization BID   linaclotide  145 mcg Oral q1600   magnesium oxide  400 mg Oral Daily   metoprolol tartrate  100 mg Oral BID   pantoprazole  40 mg Oral Daily   polyethylene glycol  34 g Oral q AM   polyvinyl alcohol  1 drop Both Eyes QHS   rosuvastatin  5 mg Oral Once per day on Mon Wed Fri   sodium chloride flush  3 mL Intravenous Q12H   urea  15 g Oral Daily   Continuous Infusions:  sodium chloride Stopped (02/16/21 1415)   PRN Meds: acetaminophen **OR** acetaminophen, docusate sodium, fluticasone, guaiFENesin-dextromethorphan, levalbuterol, magic mouthwash, nystatin, ondansetron **OR** ondansetron (ZOFRAN) IV, polyvinyl alcohol, traMADol, zolpidem   Vital Signs    Vitals:   02/18/21 2135 02/18/21 2336 02/19/21 0336 02/19/21 0801  BP: 90/60 92/62 96/70  105/81  Pulse: (!) 126 (!) 121 (!) 120 (!) 125  Resp:  20 17 18   Temp:  97.7 F (36.5 C) 97.9 F (36.6 C) 98.5 F (36.9 C)  TempSrc:  Axillary Oral Oral  SpO2:  96% 98% 98%  Weight:   81 kg   Height:       No intake or output data in the 24 hours ending 02/19/21 0828  Last 3 Weights 02/19/2021 02/18/2021 02/17/2021  Weight (lbs) 178 lb 9.2 oz 178 lb 12.7 oz 175 lb 14.4 oz  Weight (kg) 81 kg 81.1 kg 79.788 kg      Telemetry    Atrial flutter with RVR- Personally Reviewed  Physical Exam   GEN: Well nourished, well developed in no acute distress HEENT: Normal NECK:  No JVD; No carotid bruits LYMPHATICS: No lymphadenopathy CARDIAC: Tachycardic, no murmurs, rubs, gallops RESPIRATORY:  Clear to auscultation without rales, wheezing or rhonchi  ABDOMEN: Soft, non-tender, non-distended MUSCULOSKELETAL:  No edema; No deformity  SKIN: Warm and dry NEUROLOGIC:  Alert and oriented x 3 PSYCHIATRIC:  Normal affect   Labs    High Sensitivity Troponin:   Recent Labs  Lab 02/11/21 0719 02/11/21 0925  TROPONINIHS 12 10      Chemistry Recent Labs  Lab 02/16/21 0536 02/17/21 0623 02/18/21 0604 02/19/21 0607  NA 136 132* 135 134*  K 4.3 4.1 3.8 4.2  CL 96* 94* 92* 94*  CO2 32 29 34* 32  GLUCOSE 101* 105* 105* 105*  BUN 14 12 28* 25*  CREATININE 0.72 0.85 0.98 1.15*  CALCIUM 8.7* 8.7* 8.9 8.6*  MG 1.7 1.8  --  1.8  GFRNONAA >60 >60 >60 51*  ANIONGAP 8 9 9 8       Hematology Recent Labs  Lab 02/13/21 0736 02/15/21 0450  WBC 2.5* 2.7*  RBC 3.26* 3.01*  HGB 11.0* 10.1*  HCT 31.9* 30.1*  MCV 97.9 100.0  MCH 33.7 33.6  MCHC 34.5  33.6  RDW 22.9* 22.8*  PLT 64* 78*     BNP No results for input(s): BNP, PROBNP in the last 168 hours.     Patient Profile     71 y.o. female with past medical history of small cell lung cancer, chronic diastolic congestive heart failure paroxysmal atrial fibrillation admitted with atrial flutter.  Underwent successful cardioversion on February 6 but atrial flutter has recurred.  Assessment & Plan    1 atrial flutter -Status post successful cardioversion to sinus rhythm on 02/13/2021 but had early reversion back to atrial flutter with RVR  -heart rate control has been difficult and she still remains in atrial flutter with RVR in the 120s -Continue Lopressor 100 mg twice daily, digoxin 0.125 mg daily, apixaban 5 mg twice daily and amiodarone 400 mg twice daily for 1 week then 200 mg after.. -Cannot push metoprolol further due to soft blood pressure  -She has not missed any doses of Eliquis in the past 4  weeks -Since we have not been able to get her rate controlled we will make her n.p.o. after midnight for DCCV in a.m.  -Shared Decision Making/Informed Consent The risks (stroke, cardiac arrhythmias rarely resulting in the need for a temporary or permanent pacemaker, skin irritation or burns and complications associated with conscious sedation including aspiration, arrhythmia, respiratory failure and death), benefits (restoration of normal sinus rhythm) and alternatives of a direct current cardioversion were explained in detail to Patricia [name] and she agrees to proceed.    2 acute on chronic combined systolic/diastolic heart failure -I's and O's are incomplete -She does not appear volume overloaded on exam today -Labs today so serum creatinine 1.15 and potassium 4.2. -BP is running soft and serum creatinine has bumped so we will hold Lasix today and start 40 mg daily tomorrow  3 small cell lung cancer -treatment associated with neutropenia and thrombocytopenia; managed by oncology.  I have spent a total of 30 minutes with patient reviewing 2D echo, telemetry, EKGs, labs and examining patient as well as establishing an assessment and plan that was discussed with the patient.  > 50% of time was spent in direct patient care.     For questions or updates, please contact [place] Please consult [email or web address] for contact info under        Signed, Fransico Him, MD  02/19/2021, 8:28 AM

## 2021-02-20 ENCOUNTER — Ambulatory Visit (HOSPITAL_COMMUNITY): Payer: Medicare HMO

## 2021-02-20 ENCOUNTER — Encounter (HOSPITAL_COMMUNITY)
Admission: EM | Disposition: A | Discharge status: Home / Self Care | Payer: Self-pay | Source: Home / Self Care | Attending: Internal Medicine

## 2021-02-20 ENCOUNTER — Encounter (HOSPITAL_COMMUNITY): Payer: Self-pay | Admitting: Internal Medicine

## 2021-02-20 ENCOUNTER — Inpatient Hospital Stay (HOSPITAL_COMMUNITY): Payer: Medicare HMO | Admitting: Certified Registered Nurse Anesthetist

## 2021-02-20 ENCOUNTER — Inpatient Hospital Stay: Payer: Medicare HMO

## 2021-02-20 DIAGNOSIS — I4892 Unspecified atrial flutter: Secondary | ICD-10-CM | POA: Diagnosis not present

## 2021-02-20 DIAGNOSIS — I5033 Acute on chronic diastolic (congestive) heart failure: Secondary | ICD-10-CM | POA: Diagnosis not present

## 2021-02-20 DIAGNOSIS — E222 Syndrome of inappropriate secretion of antidiuretic hormone: Secondary | ICD-10-CM | POA: Diagnosis not present

## 2021-02-20 DIAGNOSIS — I509 Heart failure, unspecified: Secondary | ICD-10-CM

## 2021-02-20 DIAGNOSIS — I4891 Unspecified atrial fibrillation: Secondary | ICD-10-CM

## 2021-02-20 DIAGNOSIS — J9621 Acute and chronic respiratory failure with hypoxia: Secondary | ICD-10-CM | POA: Diagnosis not present

## 2021-02-20 DIAGNOSIS — I110 Hypertensive heart disease with heart failure: Secondary | ICD-10-CM

## 2021-02-20 HISTORY — PX: CARDIOVERSION: SHX1299

## 2021-02-20 LAB — BASIC METABOLIC PANEL
Anion gap: 9 (ref 5–15)
BUN: 17 mg/dL (ref 8–23)
CO2: 32 mmol/L (ref 22–32)
Calcium: 9 mg/dL (ref 8.9–10.3)
Chloride: 93 mmol/L — ABNORMAL LOW (ref 98–111)
Creatinine, Ser: 0.91 mg/dL (ref 0.44–1.00)
GFR, Estimated: >60 mL/min (ref >60)
Glucose, Bld: 110 mg/dL — ABNORMAL HIGH (ref 70–99)
Potassium: 4.2 mmol/L (ref 3.5–5.1)
Sodium: 134 mmol/L — ABNORMAL LOW (ref 135–145)

## 2021-02-20 LAB — CBC WITH DIFFERENTIAL/PLATELET
Abs Immature Granulocytes: 0 10*3/uL (ref 0.00–0.07)
Basophils Absolute: 0 10*3/uL (ref 0.0–0.1)
Basophils Relative: 0 %
Eosinophils Absolute: 0 10*3/uL (ref 0.0–0.5)
Eosinophils Relative: 0 %
HCT: 36.5 % (ref 36.0–46.0)
Hemoglobin: 12.3 g/dL (ref 12.0–15.0)
Lymphocytes Relative: 10 %
Lymphs Abs: 0.3 10*3/uL — ABNORMAL LOW (ref 0.7–4.0)
MCH: 34.2 pg — ABNORMAL HIGH (ref 26.0–34.0)
MCHC: 33.7 g/dL (ref 30.0–36.0)
MCV: 101.4 fL — ABNORMAL HIGH (ref 80.0–100.0)
Monocytes Absolute: 0.3 10*3/uL (ref 0.1–1.0)
Monocytes Relative: 10 %
Neutro Abs: 2 10*3/uL (ref 1.7–7.7)
Neutrophils Relative %: 80 %
Platelets: 144 10*3/uL — ABNORMAL LOW (ref 150–400)
RBC: 3.6 MIL/uL — ABNORMAL LOW (ref 3.87–5.11)
RDW: 22.9 % — ABNORMAL HIGH (ref 11.5–15.5)
WBC: 2.5 10*3/uL — ABNORMAL LOW (ref 4.0–10.5)
nRBC: 0 % (ref 0.0–0.2)
nRBC: 0 /100 WBC

## 2021-02-20 LAB — PROTIME-INR
INR: 1.1 (ref 0.8–1.2)
Prothrombin Time: 13.7 seconds (ref 11.4–15.2)

## 2021-02-20 LAB — TSH: TSH: 5.853 u[IU]/mL — ABNORMAL HIGH (ref 0.350–4.500)

## 2021-02-20 LAB — MAGNESIUM: Magnesium: 1.9 mg/dL (ref 1.7–2.4)

## 2021-02-20 SURGERY — CARDIOVERSION
Anesthesia: General

## 2021-02-20 MED ORDER — PROPOFOL 10 MG/ML IV BOLUS
INTRAVENOUS | Status: DC | PRN
  Administered 2021-02-20: 40 mg via INTRAVENOUS

## 2021-02-20 MED ORDER — SODIUM CHLORIDE 0.9 % IV SOLN: INTRAVENOUS | Status: DC

## 2021-02-20 NOTE — Progress Notes (Signed)
PT Cancellation Note  Patient Details Name: Patricia [name] MRN: [id number] DOB: Feb 20, 1950   Cancelled Treatment:    Reason Eval/Treat Not Completed: Patient not medically ready (PT with HR 130 at rest with planned DCCV today)   Ranald Alessio B Clella Mckeel 02/20/2021, 7:12 AM Bayard Males, PT Acute Rehabilitation Services Pager: [phone] Office: [phone]

## 2021-02-20 NOTE — Transfer of Care (Signed)
Immediate Anesthesia Transfer of Care Note  Patient: Patricia [name]  Procedure(s) Performed: CARDIOVERSION  Patient Location: PACU and Endoscopy Unit  Anesthesia Type:General  Level of Consciousness: drowsy  Airway & Oxygen Therapy: Patient Spontanous Breathing and Patient connected to nasal cannula oxygen  Post-op Assessment: Report given to RN and Post -op Vital signs reviewed and stable  Post vital signs: Reviewed and stable  Last Vitals:  Vitals Value Taken Time  BP    Temp    Pulse    Resp    SpO2      Last Pain:  Vitals:   02/20/21 0953  TempSrc:   PainSc: 0-No pain      Patients Stated Pain Goal: 0 (00/71/21 9758)  Complications: No notable events documented.

## 2021-02-20 NOTE — Interval H&P Note (Signed)
History and Physical Interval Note:  02/20/2021 10:45 AM  Patricia [name]  has presented today for surgery, with the diagnosis of afib.  The various methods of treatment have been discussed with the patient and family. After consideration of risks, benefits and other options for treatment, the patient has consented to  Procedure(s): CARDIOVERSION (N/A) as a surgical intervention.  The patient's history has been reviewed, patient examined, no change in status, stable for surgery.  I have reviewed the patient's chart and labs.  Questions were answered to the patient's satisfaction.     Skeet Latch, MD

## 2021-02-20 NOTE — CV Procedure (Signed)
Electrical Cardioversion Procedure Note Patricia [name] [id number] Apr 16, 1950  Procedure: Electrical Cardioversion Indications:  Atrial Flutter  Procedure Details Consent: Risks of procedure as well as the alternatives and risks of each were explained to the (patient/caregiver).  Consent for procedure obtained. Time Out: Verified patient identification, verified procedure, site/side was marked, verified correct patient position, special equipment/implants available, medications/allergies/relevent history reviewed, required imaging and test results available.  Performed  Patient placed on cardiac monitor, pulse oximetry, supplemental oxygen as necessary.  Sedation given:  propofol Pacer pads placed anterior and posterior chest.  Cardioverted 1 time(s).  Cardioverted at 150J.  Evaluation Findings: Post procedure EKG shows: NSR Complications: None Patient did tolerate procedure well.   Skeet Latch, MD 02/20/2021, 10:50 AM

## 2021-02-20 NOTE — Anesthesia Preprocedure Evaluation (Signed)
Anesthesia Evaluation  Patient identified by MRN, date of birth, ID band Patient awake    Reviewed: Allergy & Precautions, NPO status , Patient's Chart, lab work & pertinent test results  History of Anesthesia Complications Negative for: history of anesthetic complications  Airway Mallampati: III  TM Distance: >3 FB Neck ROM: Full    Dental  (+) Teeth Intact, Dental Advisory Given   Pulmonary shortness of breath, asthma , former smoker,  Lung Ca   breath sounds clear to auscultation       Cardiovascular hypertension, Pt. on medications and Pt. on home beta blockers (-) angina+CHF  + dysrhythmias Atrial Fibrillation  Rhythm:Irregular  1. Left ventricular ejection fraction, by estimation, is 45 to 50%. The  left ventricle has mildly decreased function. The left ventricle  demonstrates global hypokinesis.  2. The right ventricular size is mildly enlarged. There is mildly  elevated pulmonary artery systolic pressure. The estimated right  ventricular systolic pressure is 10.6 mmHg.  3. Right atrial size was mildly dilated.  4. Moderate pleural effusion in the left lateral region.  5. Tricuspid valve regurgitation is moderate.  6. The inferior vena cava is dilated in size with <50% respiratory  variability, suggesting right atrial pressure of 15 mmHg.    Neuro/Psych neg Seizures TIA   GI/Hepatic Neg liver ROS, GERD  Controlled,  Endo/Other  negative endocrine ROS  Renal/GU negative Renal ROSLab Results      Component                Value               Date                      CREATININE               0.91                02/20/2021                Musculoskeletal   Abdominal   Peds  Hematology  (+) Blood dyscrasia, , eliquis  Lab Results      Component                Value               Date                      WBC                      2.5 (L)             02/20/2021                HGB                      12.3                 02/20/2021                HCT                      36.5                02/20/2021                MCV  101.4 (H)           02/20/2021                PLT                      144 (L)             02/20/2021              Anesthesia Other Findings   Reproductive/Obstetrics                             Anesthesia Physical Anesthesia Plan  ASA: 3  Anesthesia Plan: General   Post-op Pain Management: Minimal or no pain anticipated   Induction: Intravenous  PONV Risk Score and Plan: 3 and Treatment may vary due to age or medical condition  Airway Management Planned: Mask  Additional Equipment: None  Intra-op Plan:   Post-operative Plan:   Informed Consent: I have reviewed the patients History and Physical, chart, labs and discussed the procedure including the risks, benefits and alternatives for the proposed anesthesia with the patient or authorized representative who has indicated his/her understanding and acceptance.     Dental advisory given  Plan Discussed with: CRNA and Anesthesiologist  Anesthesia Plan Comments:         Anesthesia Quick Evaluation

## 2021-02-20 NOTE — Care Management Important Message (Signed)
Important Message  Patient Details  Name: Patricia [name] MRN: [id number] Date of Birth: 07/16/1950   Medicare Important Message Given:  Yes     Shelda Altes 02/20/2021, 9:30 AM

## 2021-02-20 NOTE — Anesthesia Postprocedure Evaluation (Signed)
Anesthesia Post Note  Patient: Patricia [name]  Procedure(s) Performed: CARDIOVERSION     Patient location during evaluation: Endoscopy Anesthesia Type: General Level of consciousness: awake and alert Pain management: pain level controlled Vital Signs Assessment: post-procedure vital signs reviewed and stable Respiratory status: spontaneous breathing, nonlabored ventilation, respiratory function stable and patient connected to nasal cannula oxygen Cardiovascular status: blood pressure returned to baseline and stable Postop Assessment: no apparent nausea or vomiting Anesthetic complications: no   No notable events documented.  Last Vitals:  Vitals:   02/20/21 1120 02/20/21 1125  BP: (!) 100/51 (!) 100/50  Pulse: (!) 58 (!) 57  Resp: 18 17  Temp:    SpO2: 100% 100%    Last Pain:  Vitals:   02/20/21 1125  TempSrc:   PainSc: 0-No pain                 Talton Delpriore

## 2021-02-20 NOTE — Progress Notes (Addendum)
Progress Note  Patient Name: Patricia [name] Date of Encounter: 02/20/2021  Primary Cardiologist: Will Meredith Leeds, MD   Subjective   Patient was seen before her cardioversion. She denies CP or SOB. She is on home 2L of oxygen. Reports improvement of LE edema.   Inpatient Medications    Scheduled Meds:  amiodarone  400 mg Oral BID   apixaban  5 mg Oral BID   carisoprodol  350 mg Oral TID   clonazePAM  1 mg Oral QHS   digoxin  0.125 mg Oral Daily   escitalopram  10 mg Oral Daily   furosemide  40 mg Oral Daily   gabapentin  600 mg Oral BID   ipratropium  0.5 mg Nebulization BID   linaclotide  145 mcg Oral q1600   magnesium oxide  400 mg Oral Daily   metoprolol tartrate  100 mg Oral BID   pantoprazole  40 mg Oral Daily   polyethylene glycol  34 g Oral q AM   polyvinyl alcohol  1 drop Both Eyes QHS   rosuvastatin  5 mg Oral Once per day on Mon Wed Fri   sodium chloride flush  3 mL Intravenous Q12H   sodium chloride  1 g Oral BID WC   Continuous Infusions:  sodium chloride Stopped (02/16/21 1415)   sodium chloride 20 mL/hr at 02/20/21 0710   PRN Meds: acetaminophen **OR** acetaminophen, docusate sodium, fluticasone, guaiFENesin-dextromethorphan, levalbuterol, magic mouthwash, nystatin, ondansetron **OR** ondansetron (ZOFRAN) IV, polyvinyl alcohol, traMADol, zolpidem   Vital Signs    Vitals:   02/20/21 0421 02/20/21 0500 02/20/21 0805 02/20/21 0849  BP: 114/86  115/88   Pulse: (!) 122     Resp: 19     Temp: 97.7 F (36.5 C)  97.6 F (36.4 C)   TempSrc: Oral  Oral   SpO2: 100%   99%  Weight:  82.1 kg    Height:        Intake/Output Summary (Last 24 hours) at 02/20/2021 0907 Last data filed at 02/19/2021 1205 Gross per 24 hour  Intake --  Output 450 ml  Net -450 ml   Filed Weights   02/18/21 0531 02/19/21 0336 02/20/21 0500  Weight: 81.1 kg 81 kg 82.1 kg    Telemetry    RVR 120, a flutter - Personally Reviewed  ECG    2:1 a trial flutter, HR  120 - Personally Reviewed  Physical Exam   GEN: No acute distress.   Cardiac: tachycardiac, regular, no murmurs, rubs, or gallops.  Respiratory: normal work of breathing  MS: No edema; No deformity. Neuro:  Nonfocal  Psych: Normal affect   Labs    Chemistry Recent Labs  Lab 02/18/21 0604 02/19/21 0607 02/20/21 0609  NA 135 134* 134*  K 3.8 4.2 4.2  CL 92* 94* 93*  CO2 34* 32 32  GLUCOSE 105* 105* 110*  BUN 28* 25* 17  CREATININE 0.98 1.15* 0.91  CALCIUM 8.9 8.6* 9.0  GFRNONAA >60 51* >60  ANIONGAP 9 8 9      Hematology Recent Labs  Lab 02/15/21 0450 02/20/21 0609  WBC 2.7* 2.5*  RBC 3.01* 3.60*  HGB 10.1* 12.3  HCT 30.1* 36.5  MCV 100.0 101.4*  MCH 33.6 34.2*  MCHC 33.6 33.7  RDW 22.8* 22.9*  PLT 78* 144*    Cardiac EnzymesNo results for input(s): TROPONINI in the last 168 hours. No results for input(s): TROPIPOC in the last 168 hours.   BNPNo results for input(s): BNP, PROBNP  in the last 168 hours.   DDimer No results for input(s): DDIMER in the last 168 hours.   Radiology    No results found.  Cardiac Studies   ECHOCARDIOGRAM LIMITED  Result Date: 02/13/2021    ECHOCARDIOGRAM LIMITED REPORT   Patient Name:   Patricia [name] Date of Exam: 02/13/2021 Medical Rec #:  [id number]          Height:       66.0 in Accession #:    [id number]         Weight:       185.2 lb Date of Birth:  08/29/1950           BSA:          1.935 m Patient Age:    71 years           BP:           117/80 mmHg Patient Gender: F                  HR:           120 bpm. Exam Location:  Inpatient Procedure: 2D Echo, Limited Echo, Cardiac Doppler and Color Doppler Indications:    CHF  History:        Patient has prior history of Echocardiogram examinations.                 Arrythmias:Atrial Fibrillation; Risk Factors:Hypertension and                 Dyslipidemia.  Sonographer:    Jyl Heinz Referring Phys: [place]  1. Left ventricular ejection fraction, by  estimation, is 45 to 50%. The left ventricle has mildly decreased function. The left ventricle demonstrates global hypokinesis.  2. The right ventricular size is mildly enlarged. There is mildly elevated pulmonary artery systolic pressure. The estimated right ventricular systolic pressure is 75.9 mmHg.  3. Right atrial size was mildly dilated.  4. Moderate pleural effusion in the left lateral region.  5. Tricuspid valve regurgitation is moderate.  6. The inferior vena cava is dilated in size with <50% respiratory variability, suggesting right atrial pressure of 15 mmHg. Comparison(s): Prior images reviewed side by side. The left ventricular function is worsened.   Patient Profile     71 y.o. female with past medical history of small cell lung cancer on chemotherapy with cisplatin/etoposide, persistent atrial fibrillation on Eliquis, hypertension, hyperlipidemia, HFpEF, pancytopenia who presents for dyspnea on exertion, currently evaluated for A-fib with RVR.  Assessment & Plan    Persistent A-fib/a flutter Likely exacerbated by her anemia.  Status post cardiovert 2/6 but went back to flutter with RVR.  She currently on amiodarone with Lopressor but not rate controlled.  Difficult to increase metoprolol or adding diltiazem due to low blood pressure.  No TEE with adherence to Eliquis.  Underwent successful cardioversion today.  -Will continue amiodarone, metoprolol and digoxin.  We will allow time for amiodarone to be therapeutic. -Also can consider atrial ablation for a flutter later on if a flutter persists -Anticoagulate with apixaban  Acute on chronic diastolic/systolic heart failure Incomplete in and out record.  At baseline home supplemental oxygen.  Weight trending up from 178 to 181. New reduction in EF likely from uncontrolled a flutter. -Continue Lasix 40 mg daily -Repeat echo after arrhythmia controlled  Hyperlipidemia Continue Crestor 5 mg  Primary small cell lung  cancer -Chemotherapy with oncology  For questions or updates, please  contact [place] Please consult [email or web address] for contact info under Cardiology/STEMI.      Signed, Gaylan Gerold, DO  02/20/2021, 9:07 AM     I have seen and examined the patient along with Gaylan Gerold, DO .  I have reviewed the chart, notes and new data.  I agree with PA/NP's note.  Key new complaints: Denies dyspnea.  Note that she has been taking sodium chloride tablets for SIADH.  She is currently receiving both sodium chloride 1 g daily and furosemide 40 mg daily. Key examination changes: Back in normal sinus rhythm following cardioversion Key new findings / data: Atrial flutter with 2: 1 AV block before cardioversion, now in normal sinus rhythm.  Sodium 134.  Normal renal function parameters.  PLAN: AFlutter Currently on "loading dose" of amiodarone 400 mg twice daily.  We will continue this through r the end of this week, then amiodarone 400 mg daily for another week, then amiodarone 200 mg daily indefinitely.  If she has early recurrence of atrial flutter despite treatment with amiodarone, I would advocate for cavotricuspid isthmus radiofrequency ablation, since this would lead to resolution of the arrhythmia and take away the need for anticoagulation.  Plan follow-up in the A-fib clinic next week.  In view of previous early recurrence of atrial flutter, would monitor overnight before we discharge her tomorrow morning.  CHF Currently appears to be euvolemic at a weight of about 178 pounds.  I am not certain that today's weight was accurate.  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  SIADH Treating her with both sodium chloride tablets and furosemide appears to be counterproductive. I would recommend: 1. Fluid restriction of no more than 1500 mL/24 hours, stop both the sodium chloride tablets and the furosemide. 2. Daily weights.   If her weight exceeds 180 pounds she can take furosemide 40 mg once daily until her weight is less than 180 pounds. 3. Labs frequently to check sodium level.  If she becomes hyponatremic again, may have to put her on an even tighter fluid restriction. Hopefully, left ventricular systolic function will normalize after several weeks without tachycardia.  At that point, can resume previous treatment with sodium tablets.  Sanda Klein, MD, [place] [phone] 02/20/2021, 11:45 AM

## 2021-02-20 NOTE — Progress Notes (Signed)
Progress Note   Patient: Patricia [name] MGQ:[id number] DOB: [birth year] DOA: 02/11/2021     9 DOS: the patient was seen and examined on 02/20/2021   Brief hospital course: Patricia [name] was admitted to the hospital with the working diagnosis of acute heart failure decompensation, complicated with atrial flutter with rapid ventricular response.   71 yo female with the past medical history of HTN, dyslipidemia, atrial fibrillation and flutter, diastolic heart failure and small cell lung cancer who presented with dyspnea. Patient reported progressive dyspnea for 2 months but more intense over 7 days. Recently completed chemotherapy 2 weeks ago. At 4 am on the day of admission she acute severe dyspnea, that woke her up, with wheezing and cough. On her initial physical examination her heart rate was 143, RR 18 to 33, oxygen saturation 85%, her lungs had rales but no wheezing, positive increased work of breathing, heart with S1 and S2 present and tachycardic, irregularly irregular, abdomen soft and positive lower extremity edema.   Na 137, K 4,0 Cl 99, bicarbonate 31, glucose 115 bun 14 and cr 0,86 Mag 1,3  High sensitive troponin 12  BNP 420  Wbc 6,1, hgb 10,6 hct 31,2 and plt 53   Sars covid 19 negative   Chest radiograph with bilateral interstitial infiltrates predominantly at the lower lobes with hilar vascular congestion.   CT chest with bilateral pleural effusions and ground glass opacities. No pulmonary embolism.   EKG 142 bpm, with normal axis and normal qtc, atrial flutter rhythm, with no significant ST segment or T wave changes.   Patient was placed on amiodarone infusion and diuresed with furosemide.  Underwent cardioversion with good toleration.   02/08 patient converted back to atrial flutter with rapid ventricular response. Increased metoprolol to 100 mg.   02/10 continue with RVR, added digoxin. Plan for second electrical cardioversion on 02/21/20.    Assessment and Plan: *  Atrial flutter with rapid ventricular response ([place])- (present on admission) sp cardioversion 02/06  Post cardioversion patient back on atrial flutter with rapid ventricular response, despite medical therapy with amiodarone, metoprolol and digoxin.   Today patient had second cardioversion, converting to sinus rhythm. Rate in the 70 personally reviewed.   Continue anticoagulation with apixaban.   Acute on chronic diastolic CHF (congestive heart failure) ([place])- (present on admission) Echocardiogram with EF LV 45 to 50%, mild enlargement of the RV, estimated pulmonary systolic pressure 26,7 mmHg.   Continue clinically euvolemic. Continue rate and rhythm control for atrial flutter. Continue oral diuretic therapy.    Acute on chronic respiratory failure with hypoxia ([facility]) - (present on admission) At home patient on supplemental 02 per [place], plan to keep 02 saturation 88% or greater.  Continue oxymetry monitoring. Her oxymetry this am is 100% on 2 L/min per [place]    SIADH (syndrome of inappropriate ADH production) ([place])- (present on admission) Hypokalemia. Hyponatremia, hypomagnesemia.  Unfortunately patient did not tolerate urea po  Her Na is 134 this am, with preserved renal function. Plan to continue low dose salt tablets and oral furosemide.  After stopping salt tablets she had acute worsening hyponatremia.   Antineoplastic chemotherapy induced pancytopenia ([place])- (present on admission) Oral thrush continue with nystatin.  Follow cell count today with wbc at 2,5 with hgb at 12,3 and hct at 36,5 plt 144  Plan to follow up with oncology as outpatient, patient recently completed chemotherapy for lung cancer.   Dyslipidemia- (present on admission) Continue with statin therapy .   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 Julien Nordmann of oncology - completed chemotherapy treatments of  cisplatin and etoposide approximately 2 weeks ago.,  Also completed lung radiation, plans for prophylactic whole brain radiation in the near future.   Outpatient oncology follow up.   Anxiety- (present on admission) On sertraline and escitalopram.  Continue with clonazepam.   Hypokalemia-resolved as of 02/16/2021, (present on admission) K has been corrected to 4,1  Renal function stable with serum cr at 0,79.         Subjective: Patient is feeling well after cardioversion, no dyspnea or chest pain, no nausea or vomiting.   Physical Exam: Vitals:   02/20/21 1110 02/20/21 1115 02/20/21 1120 02/20/21 1125  BP: (!) 85/48 (!) 98/50 (!) 100/51 (!) 100/50  Pulse: (!) 58 (!) 56 (!) 58 (!) 57  Resp: 18 17 18 17   Temp:      TempSrc:      SpO2: 99% 100% 100% 100%  Weight:      Height:       Neurology awake and alert ENT with mild pallor Cardiovascular with S1 and S2 present and regular with no gallops or murmurs No JVD No lower extremity edema Respiratory with no wheezing or rales Abdomen soft and non tender  Data Reviewed:    Family Communication: I spoke with patient's husband  at the bedside, we talked in detail about patient's condition, plan of care and prognosis and all questions were addressed.   Disposition: Status is: Inpatient Remains inpatient appropriate because: atrial  flutter post cardioversion x2, needs 24hours of telemetry monitoring post procedure.       Planned Discharge Destination: Home     Author: Tawni Millers, MD 02/20/2021 4:17 PM  For on call review [email or web address].

## 2021-02-21 ENCOUNTER — Other Ambulatory Visit (HOSPITAL_COMMUNITY): Payer: Self-pay

## 2021-02-21 DIAGNOSIS — I5033 Acute on chronic diastolic (congestive) heart failure: Secondary | ICD-10-CM | POA: Diagnosis not present

## 2021-02-21 DIAGNOSIS — D61810 Antineoplastic chemotherapy induced pancytopenia: Secondary | ICD-10-CM

## 2021-02-21 DIAGNOSIS — E222 Syndrome of inappropriate secretion of antidiuretic hormone: Secondary | ICD-10-CM | POA: Diagnosis not present

## 2021-02-21 DIAGNOSIS — E785 Hyperlipidemia, unspecified: Secondary | ICD-10-CM

## 2021-02-21 DIAGNOSIS — F419 Anxiety disorder, unspecified: Secondary | ICD-10-CM

## 2021-02-21 DIAGNOSIS — T451X5A Adverse effect of antineoplastic and immunosuppressive drugs, initial encounter: Secondary | ICD-10-CM

## 2021-02-21 DIAGNOSIS — I4892 Unspecified atrial flutter: Secondary | ICD-10-CM | POA: Diagnosis not present

## 2021-02-21 DIAGNOSIS — C3432 Malignant neoplasm of lower lobe, left bronchus or lung: Secondary | ICD-10-CM

## 2021-02-21 DIAGNOSIS — J9621 Acute and chronic respiratory failure with hypoxia: Secondary | ICD-10-CM | POA: Diagnosis not present

## 2021-02-21 MED ORDER — DIGOXIN 125 MCG PO TABS
0.1250 mg | ORAL_TABLET | Freq: Every day | ORAL | 0 refills | Status: DC
  Filled 2021-02-21: qty 30, 30d supply, fill #0

## 2021-02-21 MED ORDER — AMIODARONE HCL 200 MG PO TABS
ORAL_TABLET | ORAL | 0 refills | Status: DC
  Filled 2021-02-21: qty 60, 30d supply, fill #0

## 2021-02-21 MED ORDER — METOPROLOL TARTRATE 100 MG PO TABS
100.0000 mg | ORAL_TABLET | Freq: Two times a day (BID) | ORAL | 0 refills | Status: DC
  Filled 2021-02-21: qty 60, 30d supply, fill #0

## 2021-02-21 MED ORDER — FUROSEMIDE 20 MG PO TABS
20.0000 mg | ORAL_TABLET | Freq: Every day | ORAL | 2 refills | Status: DC
Start: 2021-02-21 — End: 2021-09-23

## 2021-02-21 NOTE — Progress Notes (Addendum)
Progress Note  Patient Name: Patricia [name] Date of Encounter: 02/21/2021  Primary Cardiologist: Will Meredith Leeds, MD   Subjective   Patient is seen at bedside. She appears comfortable and in no acute distress. Reports no change overnight. States that her urine remains darker.   Inpatient Medications    Scheduled Meds:  amiodarone  400 mg Oral BID   apixaban  5 mg Oral BID   carisoprodol  350 mg Oral TID   clonazePAM  1 mg Oral QHS   digoxin  0.125 mg Oral Daily   escitalopram  10 mg Oral Daily   furosemide  40 mg Oral Daily   gabapentin  600 mg Oral BID   ipratropium  0.5 mg Nebulization BID   linaclotide  145 mcg Oral q1600   magnesium oxide  400 mg Oral Daily   metoprolol tartrate  100 mg Oral BID   pantoprazole  40 mg Oral Daily   polyethylene glycol  34 g Oral q AM   polyvinyl alcohol  1 drop Both Eyes QHS   rosuvastatin  5 mg Oral Once per day on Mon Wed Fri   sodium chloride flush  3 mL Intravenous Q12H   sodium chloride  1 g Oral BID WC   Continuous Infusions:  PRN Meds: acetaminophen **OR** acetaminophen, docusate sodium, fluticasone, guaiFENesin-dextromethorphan, levalbuterol, magic mouthwash, nystatin, ondansetron **OR** ondansetron (ZOFRAN) IV, polyvinyl alcohol, traMADol, zolpidem   Vital Signs    Vitals:   02/20/21 2353 02/21/21 0451 02/21/21 0746 02/21/21 0806  BP: (!) 100/50 (!) 106/47 104/61   Pulse: 65 62    Resp: 20 (!) 31 (!) 21   Temp: (!) 97.5 F (36.4 C) 98 F (36.7 C) 97.7 F (36.5 C)   TempSrc:  Oral Oral   SpO2:  98%  98%  Weight:  82.8 kg    Height:        Intake/Output Summary (Last 24 hours) at 02/21/2021 0853 Last data filed at 02/21/2021 0747 Gross per 24 hour  Intake 150 ml  Output --  Net 150 ml   Filed Weights   02/19/21 0336 02/20/21 0500 02/21/21 0451  Weight: 81 kg 82.1 kg 82.8 kg    Telemetry    NSR overnight, HR 60s - Personally Reviewed  ECG    2/14: sinus brady with 1st degree AV block, flattened  T wave - Personally Reviewed  Physical Exam   GEN: No acute distress.   Neck: No JVD Cardiac: RRR, no murmurs, rubs, or gallops.  Respiratory: normal work of breathing MS: trace edema. Extremities warm to touch Neuro:  Nonfocal  Psych: Normal affect   Labs    Chemistry Recent Labs  Lab 02/18/21 0604 02/19/21 0607 02/20/21 0609  NA 135 134* 134*  K 3.8 4.2 4.2  CL 92* 94* 93*  CO2 34* 32 32  GLUCOSE 105* 105* 110*  BUN 28* 25* 17  CREATININE 0.98 1.15* 0.91  CALCIUM 8.9 8.6* 9.0  GFRNONAA >60 51* >60  ANIONGAP 9 8 9      Hematology Recent Labs  Lab 02/15/21 0450 02/20/21 0609  WBC 2.7* 2.5*  RBC 3.01* 3.60*  HGB 10.1* 12.3  HCT 30.1* 36.5  MCV 100.0 101.4*  MCH 33.6 34.2*  MCHC 33.6 33.7  RDW 22.8* 22.9*  PLT 78* 144*    Cardiac EnzymesNo results for input(s): TROPONINI in the last 168 hours. No results for input(s): TROPIPOC in the last 168 hours.   BNPNo results for input(s): BNP, PROBNP in  the last 168 hours.   DDimer No results for input(s): DDIMER in the last 168 hours.   Radiology    No results found.  Cardiac Studies   ECHOCARDIOGRAM LIMITED   Result Date: 02/13/2021    ECHOCARDIOGRAM LIMITED REPORT   Patient Name:   Patricia [name] Date of Exam: 02/13/2021 Medical Rec #:  [id number]          Height:       66.0 in Accession #:    [id number]         Weight:       185.2 lb Date of Birth:  1950-09-29           BSA:          1.935 m Patient Age:    71 years           BP:           117/80 mmHg Patient Gender: F                  HR:           120 bpm. Exam Location:  Inpatient Procedure: 2D Echo, Limited Echo, Cardiac Doppler and Color Doppler Indications:    CHF  History:        Patient has prior history of Echocardiogram examinations.                 Arrythmias:Atrial Fibrillation; Risk Factors:Hypertension and                 Dyslipidemia.  Sonographer:    Jyl Heinz Referring Phys: [place]  1. Left ventricular ejection  fraction, by estimation, is 45 to 50%. The left ventricle has mildly decreased function. The left ventricle demonstrates global hypokinesis.  2. The right ventricular size is mildly enlarged. There is mildly elevated pulmonary artery systolic pressure. The estimated right ventricular systolic pressure is 44.0 mmHg.  3. Right atrial size was mildly dilated.  4. Moderate pleural effusion in the left lateral region.  5. Tricuspid valve regurgitation is moderate.  6. The inferior vena cava is dilated in size with <50% respiratory variability, suggesting right atrial pressure of 15 mmHg. Comparison(s): Prior images reviewed side by side. The left ventricular function is worsened.   Patient Profile     71 y.o. female with past medical history of small cell lung cancer on chemotherapy with cisplatin/etoposide, persistent atrial fibrillation on Eliquis, hypertension, hyperlipidemia, HFpEF, pancytopenia who presents for dyspnea on exertion, currently evaluated for A-fib with RVR.  Assessment & Plan    Persistent A-fib/a flutter => now NSR Underwent successful DCCV yesterday and remains in NSR overnight. Hopefully she will remain in sinus since her anemia improves. Will continue with Amiodarone 400 mg BID this week then 400 mg daily next week and 200 mg daily indefinitely after. Continue lopressor and Digoxin.   -Ok for discharge on Cardiology perspective. She will follow up with afib clinic next week. -Consider atrial ablation later on if a flutter persists -Anticoagulate with apixaban   Acute on chronic diastolic/systolic heart failure Incomplete in and out record.  At baseline home supplemental oxygen.  Weight stable 181 - 182. She appears euvolemic on exam. New reduction in EF likely from uncontrolled a flutter. -Can consider holding lasix and salt tablets. Recommend resume lasix if Wt > 180 lbs.  -Repeat echo after arrhythmia controlled  SIADH 2/2 small cell lung cancer. Sodium stable at 134. She  voiced concern of holding tablets  because she had a syncopal episode in the past due to hyponatremia.  -Can consider holding salt tablets for now. If her LV function recover, can resume salt tablets.  -Recommend frequent labs after discharge.    Hyperlipidemia Continue Crestor 5 mg   Primary small cell lung cancer -Chemotherapy with oncology  For questions or updates, please contact [place] Please consult [email or web address] for contact info under Cardiology/STEMI.      Signed, Gaylan Gerold, DO  02/21/2021, 8:53 AM      I have seen and examined the patient along with Gaylan Gerold, DO .  I have reviewed the chart, notes and new data.  I agree with his note.  Key new complaints: Was able to walk the length of the unit with PT. Dyspneic at the end.  Key examination changes: Maintaining normal sinus rhythm.  No obvious edema.  Appears pale and a little weak. Key new findings / data: Normal sinus rhythm on telemetry  PLAN:  [facility] will sign off.   Medication Recommendations:   -Stop both sodium chloride tablets and furosemide -Amiodarone 400 mg twice daily until February 18, then 200 mg twice daily until February 25, then 200 mg daily -Digoxin 0.125 mg daily -Eliquis 5 mg twice daily -Metoprolol tartrate to 100 mg twice daily -Furosemide 40 mg once daily only on days when her weight exceeds 180 pounds Other recommendations (labs, testing, etc):   -Fluid restriction 1500 mL daily (to include water, juice, soda, tea, coffee, ice cream/popsicles, etc.) -Daily weight monitoring.  Call cardiology office if weight increases by more than 3 pounds in 24 hours or 5 pounds in a week or if weight reaches 185 pounds -Weekly basic metabolic panel to screen for hyponatremia Follow up as an outpatient:   -We will make arrangements for follow-up visit hopefully within the next 1 or 2 weeks.   Sanda Klein, MD, [place] [phone] 02/21/2021, 11:43 AM

## 2021-02-21 NOTE — Progress Notes (Signed)
Heart Failure Nurse Navigator Progress Note  Placed HV TOC clinic appt after screening at request of Dr. Sallyanne Kuster. Pt has transportation. No immediate social needs noted.   Friday, Feb 24 @ [place], MSN, RN Heart Failure Nurse Navigator [phone]

## 2021-02-21 NOTE — Plan of Care (Signed)

## 2021-02-21 NOTE — Discharge Summary (Addendum)
Physician Discharge Summary   Patient: Patricia [name] MRN: [id number] DOB: March 16, 1950  Admit date:     02/11/2021  Discharge date: 02/21/21  Discharge Physician: Jimmy Picket Ashani Pumphrey   PCP: [name], MD   Recommendations at discharge:    Patient has been placed on amiodarone  Increased metoprolol to 100 mg bid Added digoxin Discontinue salt tablets and change furosemide to take as needed for hypervolemia.   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   Hospital Course: Patricia [name] was admitted to the hospital with the working diagnosis of acute heart failure decompensation, complicated with atrial flutter with rapid ventricular response.   71 yo female with the past medical history of HTN, dyslipidemia, atrial fibrillation and flutter, diastolic heart failure and small cell lung cancer who presented with dyspnea. Patient reported progressive dyspnea for 2 months but more intense over 7 days. Recently completed chemotherapy 2 weeks ago. At 4 am on the day of admission she had acute severe dyspnea, that woke her up, with wheezing and cough. She came to the hospital for further evaluation. On her initial physical examination her heart rate was 143, RR 18 to 33, oxygen saturation 85%, her lungs had rales but no wheezing, positive increased work of breathing, heart with S1 and S2 present and tachycardic, irregularly irregular, abdomen soft and positive lower extremity edema.   Na 137, K 4,0 Cl 99, bicarbonate 31, glucose 115 bun  14 and cr 0,86 Mag 1,3  High sensitive troponin 12  BNP 420  Wbc 6,1, hgb 10,6 hct 31,2 and plt 53   Sars covid 19 negative   Chest radiograph with bilateral interstitial infiltrates predominantly at the lower lobes with hilar vascular congestion.   CT chest with bilateral pleural effusions and ground glass opacities. No pulmonary embolism.   EKG 142 bpm, with normal axis and normal qtc, atrial flutter rhythm, with no significant ST segment or T wave changes.   Patient was placed on amiodarone infusion and diuresed with furosemide.  Underwent cardioversion with good toleration.   02/08 patient converted back to atrial flutter with rapid ventricular response. Increased metoprolol to 100 mg.   02/10 continue with RVR, added digoxin. Electrical cardioversion on 02/21/20 and returning to sinus rhythm.  Patient has been on sinus rhythm for 24 hrs, plan to follow as outpatient, continue with amiodarone and AV blockade.    Assessment and Plan: * Atrial flutter with rapid ventricular response ([place])- (present on admission) Patient was admitted to the cardiac ward, she was placed on continuous telemetry monitoring.   Patient with persistent rapid ventricular response despite medical therapy. She underwent direct current cardioversion on 02/06  Post cardioversion patient back on atrial flutter with rapid ventricular response, medical therapy was adjusted but continue with RVR Medical therapy with amiodarone, metoprolol and digoxin.   02/13 second cardioversion, converting to sinus rhythm. Plan to continue rate and rhythm control and follow up as outpatient. Continue anticoagulation with apixaban.    Acute on chronic diastolic CHF (congestive heart failure) ([place])- (present on admission) Patient received diuresis with  IV furosemide with good toleration.   Further work up with echocardiogram with EF LV 45 to 50%, mild enlargement of the RV, estimated pulmonary systolic pressure 31,5 mmHg.    Continue clinically euvolemic. Continue rate and rhythm control for atrial flutter. Patient will continue taking furosemide as needed for hypervolemia.    Acute on chronic respiratory failure with hypoxia (HCC) - (present on admission) Volume status and oxygenation improved with diuresis.  At discharge patient with no dyspnea, her oxymetry is 92% on 2 L/min per [place].  Continue with home 02.    SIADH (syndrome of inappropriate ADH production) ([place])- (present on admission) Hypokalemia. Hyponatremia, hypomagnesemia.  Unfortunately patient did not tolerate urea po  Salt tablets have been discontinued, patient will have a close follow up on electrolytes per cardiology office.    Antineoplastic chemotherapy induced pancytopenia ([place])- (present on admission) Oral thrush continue with nystatin.   Plan to follow up with oncology as outpatient, patient recently completed chemotherapy for lung cancer.   Dyslipidemia- (present on admission) Continue with statin therapy .   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 Julien Nordmann of oncology - completed chemotherapy treatments of cisplatin and etoposide approximately 2 weeks ago.,  Also completed lung radiation, plans for prophylactic whole brain radiation in the near future.   Outpatient oncology follow up.   Anxiety- (present on admission) On sertraline and escitalopram.  Continue with clonazepam.   Hypokalemia-resolved as of 02/16/2021, (present on admission) K has been corrected to 4,1  Renal function stable with serum cr at 0,79.            Consultants: cardiology  Procedures performed: DC cardioversion x2   Disposition: Home Diet recommendation:  Discharge Diet Orders (From admission, onward)     Start     Ordered   02/21/21 0000  Diet - low sodium heart healthy        02/21/21 1218           Cardiac diet  DISCHARGE  MEDICATION: Allergies as of 02/21/2021       Reactions   Augmentin [amoxicillin-pot Clavulanate] Nausea Only, Other (See Comments)   GI upset   Levaquin [levofloxacin In D5w] Other (See Comments)   Joint problems   Magnesium Hydroxide Other (See Comments)   Welts Other reaction(s): welts   Oxycodone Other (See Comments)   Nausea   Other Itching   Paper tape        Medication List     STOP taking these medications    diltiazem 360 MG 24 hr capsule Commonly known as: CARDIZEM CD   sodium chloride 1 g tablet       TAKE these medications    amiodarone 200 MG tablet Commonly known as: PACERONE Take 2 tablets twice daily until February 18, then take 1 tablets twice daily until February 25, then continue with 1 tablet daily   carboxymethylcellulose 0.5 % Soln Commonly known as: REFRESH PLUS Place 1 drop into both eyes 3 (three) times daily as needed (for dryness).   carisoprodol 350 MG tablet Commonly known as: SOMA Take 350 mg by mouth 3 (three) times daily.   clobetasol ointment 0.05 % Commonly known as: TEMOVATE Apply 1 application topically See admin instructions. Apply to vaginal area daily as directed   clonazePAM 1 MG tablet Commonly known as: KLONOPIN Take 1 mg by mouth at bedtime.   digoxin 0.125 MG  tablet Commonly known as: LANOXIN Take 1 tablet (0.125 mg total) by mouth daily. Start taking on: February 22, 2021   docusate sodium 100 MG capsule Commonly known as: COLACE Take 1 capsule (100 mg total) by mouth 2 (two) times daily. What changed:  when to take this reasons to take this   Eliquis 5 MG Tabs tablet Generic drug: apixaban TAKE 1 TABLET(5 MG) BY MOUTH TWICE DAILY What changed: See the new instructions.   escitalopram 10 MG tablet Commonly known as: LEXAPRO Take 1 tablet (10 mg total) by mouth daily.   fluticasone 50 MCG/ACT nasal spray Commonly known as: FLONASE Place 2 sprays into both nostrils daily as needed for allergies or  rhinitis.   furosemide 20 MG tablet Commonly known as: LASIX Take 1 tablet (20 mg total) by mouth daily. Take only if weight goes above 180 lbs. What changed: additional instructions   gabapentin 600 MG tablet Commonly known as: NEURONTIN Take 600 mg by mouth See admin instructions. Take 600 mg by mouth in the morning and at lunchtime   guaiFENesin-dextromethorphan 100-10 MG/5ML syrup Commonly known as: ROBITUSSIN DM Take 10 mLs by mouth every 4 (four) hours as needed for cough.   ipratropium 0.02 % nebulizer solution Commonly known as: ATROVENT Take by nebulization 3 (three) times daily. As directed   levalbuterol 1.25 MG/3ML nebulizer solution Commonly known as: XOPENEX Inhale into the lungs as directed.   lidocaine 2 % solution Commonly known as: XYLOCAINE Use as directed 15 mLs in the mouth or throat every 6 (six) hours as needed for mouth pain.   linaclotide 145 MCG Caps capsule Commonly known as: LINZESS Take 1 capsule (145 mcg total) by mouth daily at 4 PM.   magic mouthwash Soln Take 5 mLs by mouth 4 (four) times daily as needed for mouth pain. Pt is allergic to Magnesium   magnesium oxide 400 (240 Mg) MG tablet Commonly known as: MAG-OX Take 1 tablet (400 mg total) by mouth daily.   memantine 5 MG tablet Commonly known as: Namenda Begin this prescription the first day of brain radiation. Week 1: take one tablet po qam. Week 2: take one tablet qam and qpm. Week 3: take two tablets qam, and one tablet po q pm. Week 4: take two tablets qam and qpm. Fill subsequent prescription q month.   memantine 10 MG tablet Commonly known as: Namenda Take 1 tablet (10 mg total) by mouth 2 (two) times daily.   metoprolol tartrate 100 MG tablet Commonly known as: LOPRESSOR Take 1 tablet (100 mg total) by mouth 2 (two) times daily. What changed:  medication strength how much to take when to take this   nystatin 100000 UNIT/ML suspension Commonly known as: MYCOSTATIN Take  5 mLs (500,000 Units total) by mouth 4 (four) times daily.   omeprazole 20 MG capsule Commonly known as: PRILOSEC Take 20 mg by mouth daily before breakfast.   ondansetron 8 MG disintegrating tablet Commonly known as: ZOFRAN-ODT Take 8 mg by mouth daily as needed for nausea.   polyethylene glycol 17 g packet Commonly known as: MIRALAX / GLYCOLAX Take 34 g by mouth in the morning.   prochlorperazine 10 MG tablet Commonly known as: COMPAZINE Take 1 tablet (10 mg total) by mouth every 6 (six) hours as needed for nausea or vomiting.   rosuvastatin 5 MG tablet Commonly known as: CRESTOR Take 5 mg by mouth 3 (three) times a week. Mondays Wednesdays Fridays   Systane 0.4-0.3 % Gel ophthalmic gel  Generic drug: Polyethyl Glycol-Propyl Glycol Place 1 application into both eyes at bedtime.   traMADol 50 MG tablet Commonly known as: ULTRAM Take 100 mg by mouth in the morning, at noon, and at bedtime.   Vitamin D3 50 MCG (2000 UT) Tabs Take 2,000 Units by mouth daily.   zaleplon 10 MG capsule Commonly known as: SONATA Take 10 mg by mouth at bedtime as needed (for interrupted sleep).        [facility] Follow up.   Specialty: Home Health Services Why: Registered Nurse and Physical Therapy-office to call with visit times. Contact information: [place] [address] [phone]                 Discharge Exam: Filed Weights   02/19/21 0336 02/20/21 0500 02/21/21 0451  Weight: 81 kg 82.1 kg 82.8 kg   BP 101/62    Pulse 77    Temp 97.7 F (36.5 C) (Oral)    Resp (!) 21    Ht 5\' 6"  (1.676 m)    Wt 82.8 kg    LMP  (LMP Unknown)    SpO2 92%    BMI 29.46 kg/m   Neurology awake and alert ENT with no pallor Cardiovascular with S1 and S2 present and bradycardic, no rubs, or murmurs  No JVD No lower extremity edema Respiratory with no wheezing or rales, rhonchi  Abdomen soft and non  tender  Condition at discharge: stable  The results of significant diagnostics from this hospitalization (including imaging, microbiology, ancillary and laboratory) are listed below for reference.   Imaging Studies: CT Angio Chest PE W and/or Wo Contrast  Result Date: 02/11/2021 CLINICAL DATA:  71 year old female with lung cancer. Shortness of breath. Blood transfusion yesterday. Atrial fibrillation with RVR. EXAM: CT ANGIOGRAPHY CHEST WITH CONTRAST TECHNIQUE: Multidetector CT imaging of the chest was performed using the standard protocol during bolus administration of intravenous contrast. Multiplanar CT image reconstructions and MIPs were obtained to evaluate the vascular anatomy. RADIATION DOSE REDUCTION: This exam was performed according to the departmental dose-optimization program which includes automated exposure control, adjustment of the mA and/or kV according to patient size and/or use of iterative reconstruction technique. CONTRAST:  156mL OMNIPAQUE IOHEXOL 350 MG/ML SOLN COMPARISON:  Chest CT without contrast 12/27/2020. PET-CT 11/04/2020 FINDINGS: Cardiovascular: Good contrast bolus timing in the pulmonary arterial tree. Mild respiratory motion. No focal filling defect identified in the pulmonary arteries to suggest acute pulmonary embolism. Stable cardiac size, within normal limits. No pericardial effusion. Little aortic contrast. Aortic and coronary artery calcified atherosclerosis. Mediastinum/Nodes: No discrete mediastinal mass or lymphadenopathy. Lungs/Pleura: Moderate bilateral layering pleural effusions have mildly progressed since December. Cannot exclude complex fluid density, exudate. Compressive, enhancing atelectasis in both lungs. Superimposed atelectatic changes to the central airways which remain patent. Underlying centrilobular emphysema most apparent in the upper lobes. New compared to December curvilinear and masslike opacity in the lingula, but seems to be enhancing similar to  the other atelectatic lung (series 8, image 68. Bronchopneumonia felt less likely. Primary left infrahilar tumor not well evaluated due to presence of effusions. Upper Abdomen: Visible liver, gallbladder, spleen, pancreas, adrenal glands and kidneys appear stable since December. Negative visible bowel. No free air or free fluid in the visible upper abdomen. Musculoskeletal: Previously augmented upper lumbar compression fracture. Osteopenia. No acute or suspicious osseous lesion identified. Review of the MIP images confirms the above findings. IMPRESSION: 1. No  evidence of acute pulmonary embolus. 2. Progressed since December and now moderate bilateral layering pleural effusions, cannot exclude exudative fluid. Associated compressive atelectasis, and probably new round atelectasis in the lingula, with Bronchopneumonia felt less likely. Known primary left lower lobe tumor not well evaluated due to presence of effusions. 3. Aortic Atherosclerosis (ICD10-I70.0) and Emphysema (ICD10-J43.9). Electronically Signed   By: Genevie Ann M.D.   On: 02/11/2021 09:46   MR BRAIN W WO CONTRAST  Result Date: 02/14/2021 CLINICAL DATA:  Small cell lung cancer (SCLC), staging EXAM: MRI HEAD WITHOUT AND WITH CONTRAST TECHNIQUE: Multiplanar, multiecho pulse sequences of the brain and surrounding structures were obtained without and with intravenous contrast. CONTRAST:  59mL GADAVIST GADOBUTROL 1 MMOL/ML IV SOLN COMPARISON:  10/16/2020 FINDINGS: Brain: There is no acute infarction or intracranial hemorrhage. There is no intracranial mass, mass effect, or edema. There is no hydrocephalus or extra-axial fluid collection. Ventricles and sulci are stable in size and configuration. Patchy T2 hyperintensity in the supratentorial white matter is nonspecific but may reflect mild chronic microvascular ischemic changes. No abnormal enhancement. Vascular: Major vessel flow voids at the skull base are preserved. Skull and upper cervical spine: Normal  marrow signal is preserved. Sinuses/Orbits: Focal anterior right ethmoid air cell opacification. Orbits are unremarkable. Other: Sella is unremarkable. Patchy left greater than right mastoid fluid opacification. IMPRESSION: No evidence of intracranial metastatic disease. Electronically Signed   By: Macy Mis M.D.   On: 02/14/2021 13:07   DG Chest Portable 1 View  Result Date: 02/11/2021 CLINICAL DATA:  71 year old female with shortness of breath. Lung cancer. EXAM: PORTABLE CHEST 1 VIEW COMPARISON:  Chest CT 12/27/2020 and earlier. FINDINGS: Portable AP semi upright view at 0738 hours. Continued veiling opacity at both lung bases greater on the right. Bilateral pleural effusions and atelectasis demonstrated in December. Underlying emphysema. Ventilation has not significantly changed. Mediastinal contours remain normal. Visualized tracheal air column is within normal limits. No pneumothorax. No acute osseous abnormality identified. Paucity of bowel gas in the upper abdomen. IMPRESSION: Continued left greater than right pleural effusions and associated lung base opacity which most resembled atelectasis on December CT. Underlying Emphysema (ICD10-J43.9). No new cardiopulmonary abnormality. Electronically Signed   By: Genevie Ann M.D.   On: 02/11/2021 08:04   ECHOCARDIOGRAM LIMITED  Result Date: 02/13/2021    ECHOCARDIOGRAM LIMITED REPORT   Patient Name:   Patricia [name] Date of Exam: 02/13/2021 Medical Rec #:  [id number]          Height:       66.0 in Accession #:    [id number]         Weight:       185.2 lb Date of Birth:  26-Aug-1950           BSA:          1.935 m Patient Age:    11 years           BP:           117/80 mmHg Patient Gender: F                  HR:           120 bpm. Exam Location:  Inpatient Procedure: 2D Echo, Limited Echo, Cardiac Doppler and Color Doppler Indications:    CHF  History:        Patient has prior history of Echocardiogram examinations.  Arrythmias:Atrial  Fibrillation; Risk Factors:Hypertension and                 Dyslipidemia.  Sonographer:    Jyl Heinz Referring Phys: [place]  1. Left ventricular ejection fraction, by estimation, is 45 to 50%. The left ventricle has mildly decreased function. The left ventricle demonstrates global hypokinesis.  2. The right ventricular size is mildly enlarged. There is mildly elevated pulmonary artery systolic pressure. The estimated right ventricular systolic pressure is 32.9 mmHg.  3. Right atrial size was mildly dilated.  4. Moderate pleural effusion in the left lateral region.  5. Tricuspid valve regurgitation is moderate.  6. The inferior vena cava is dilated in size with <50% respiratory variability, suggesting right atrial pressure of 15 mmHg. Comparison(s): Prior images reviewed side by side. The left ventricular function is worsened. FINDINGS  Left Ventricle: Left ventricular ejection fraction, by estimation, is 45 to 50%. The left ventricle has mildly decreased function. The left ventricle demonstrates global hypokinesis. Right Ventricle: The right ventricular size is mildly enlarged. There is mildly elevated pulmonary artery systolic pressure. The tricuspid regurgitant velocity is 2.51 m/s, and with an assumed right atrial pressure of 15 mmHg, the estimated right ventricular systolic pressure is 92.4 mmHg. Left Atrium: Left atrial size was normal in size. Right Atrium: Right atrial size was mildly dilated. Tricuspid Valve: The tricuspid valve is normal in structure. Tricuspid valve regurgitation is moderate. Aortic Valve: Aortic valve peak gradient measures 3.4 mmHg. Pulmonic Valve: Pulmonic valve regurgitation is trivial. Venous: The inferior vena cava is dilated in size with less than 50% respiratory variability, suggesting right atrial pressure of 15 mmHg. Additional Comments: There is a moderate pleural effusion in the left lateral region. LEFT VENTRICLE PLAX 2D LVIDd:         4.20 cm      Diastology LVIDs:         3.10 cm     LV e' medial:    9.36 cm/s LV PW:         0.90 cm     LV E/e' medial:  8.4 LV IVS:        1.00 cm     LV e' lateral:   11.10 cm/s LVOT diam:     2.00 cm     LV E/e' lateral: 7.1 LV SV:         37 LV SV Index:   19 LVOT Area:     3.14 cm  LV Volumes (MOD) LV vol d, MOD A2C: 61.2 ml LV vol d, MOD A4C: 56.5 ml LV vol s, MOD A2C: 25.5 ml LV vol s, MOD A4C: 24.2 ml LV SV MOD A2C:     35.7 ml LV SV MOD A4C:     56.5 ml LV SV MOD BP:      34.0 ml RIGHT VENTRICLE            IVC RV S prime:     9.36 cm/s  IVC diam: 2.80 cm TAPSE (M-mode): 1.4 cm LEFT ATRIUM         Index LA diam:    3.10 cm 1.60 cm/m  AORTIC VALVE AV Area (Vmax): 2.60 cm AV Vmax:        91.70 cm/s AV Peak Grad:   3.4 mmHg LVOT Vmax:      76.00 cm/s LVOT Vmean:     54.700 cm/s LVOT VTI:       0.119 m  AORTA Ao Root diam:  3.30 cm Ao Asc diam:  3.10 cm MITRAL VALVE               TRICUSPID VALVE MV Area (PHT): 6.54 cm    TR Peak grad:   25.2 mmHg MV Decel Time: 116 msec    TR Vmax:        251.00 cm/s MV E velocity: 78.40 cm/s                            SHUNTS                            Systemic VTI:  0.12 m                            Systemic Diam: 2.00 cm Candee Furbish MD Electronically signed by Candee Furbish MD Signature Date/Time: 02/13/2021/3:15:15 PM    Final     Microbiology: Results for orders placed or performed during the hospital encounter of 02/11/21  Resp Panel by RT-PCR (Flu A&B, Covid) Nasopharyngeal Swab     Status: None   Collection Time: 02/11/21  7:30 AM   Specimen: Nasopharyngeal Swab; Nasopharyngeal(NP) swabs in vial transport medium  Result Value Ref Range Status   SARS Coronavirus 2 by RT PCR NEGATIVE NEGATIVE Final    Comment: (NOTE) SARS-CoV-2 target nucleic acids are NOT DETECTED.  The SARS-CoV-2 RNA is generally detectable in upper respiratory specimens during the acute phase of infection. The lowest concentration of SARS-CoV-2 viral copies this assay can detect is 138 copies/mL. A  negative result does not preclude SARS-Cov-2 infection and should not be used as the sole basis for treatment or other patient management decisions. A negative result may occur with  improper specimen collection/handling, submission of specimen other than nasopharyngeal swab, presence of viral mutation(s) within the areas targeted by this assay, and inadequate number of viral copies(<138 copies/mL). A negative result must be combined with clinical observations, patient history, and epidemiological information. The expected result is Negative.  Fact Sheet for Patients:  EntrepreneurPulse.com.au  Fact Sheet for Healthcare Providers:  IncredibleEmployment.be  This test is no t yet approved or cleared by the [place] FDA and  has been authorized for detection and/or diagnosis of SARS-CoV-2 by FDA under an Emergency Use Authorization (EUA). This EUA will remain  in effect (meaning this test can be used) for the duration of the COVID-19 declaration under Section 564(b)(1) of the Act, 21 U.S.C.section 360bbb-3(b)(1), unless the authorization is terminated  or revoked sooner.       Influenza A by PCR NEGATIVE NEGATIVE Final   Influenza B by PCR NEGATIVE NEGATIVE Final    Comment: (NOTE) The Xpert Xpress SARS-CoV-2/FLU/RSV plus assay is intended as an aid in the diagnosis of influenza from Nasopharyngeal swab specimens and should not be used as a sole basis for treatment. Nasal washings and aspirates are unacceptable for Xpert Xpress SARS-CoV-2/FLU/RSV testing.  Fact Sheet for Patients: EntrepreneurPulse.com.au  Fact Sheet for Healthcare Providers: IncredibleEmployment.be  This test is not yet approved or cleared by the [place] FDA and has been authorized for detection and/or diagnosis of SARS-CoV-2 by FDA under an Emergency Use Authorization (EUA). This EUA will remain in effect (meaning this test can  be used) for the duration of the COVID-19 declaration under Section 564(b)(1) of the Act, 21 U.S.C. section 360bbb-3(b)(1), unless the authorization is terminated  or revoked.  Performed at [facility], [place] [address], [place],  [place] [address]   Culture, blood (routine x 2)     Status: None   Collection Time: 02/11/21  5:04 PM   Specimen: BLOOD RIGHT HAND  Result Value Ref Range Status   Specimen Description BLOOD RIGHT HAND  Final   Special Requests   Final    BOTTLES DRAWN AEROBIC AND ANAEROBIC Blood Culture adequate volume   Culture   Final    NO GROWTH 5 DAYS Performed at [facility], [place] [address] [place], [place] [address]    Report Status 02/16/2021 FINAL  Final  Culture, blood (routine x 2)     Status: None   Collection Time: 02/11/21  5:15 PM   Specimen: BLOOD LEFT HAND  Result Value Ref Range Status   Specimen Description BLOOD LEFT HAND  Final   Special Requests   Final    BOTTLES DRAWN AEROBIC AND ANAEROBIC Blood Culture adequate volume   Culture   Final    NO GROWTH 5 DAYS Performed at [place] [facility], [place] [address], [place], [place] [address]    Report Status 02/16/2021 FINAL  Final    Labs: CBC: Recent Labs  Lab 02/15/21 0450 02/20/21 0609  WBC 2.7* 2.5*  NEUTROABS  --  2.0  HGB 10.1* 12.3  HCT 30.1* 36.5  MCV 100.0 101.4*  PLT 78* 709*   Basic Metabolic Panel: Recent Labs  Lab 02/16/21 0536 02/17/21 0623 02/18/21 0604 02/19/21 0607 02/20/21 0609  NA 136 132* 135 134* 134*  K 4.3 4.1 3.8 4.2 4.2  CL 96* 94* 92* 94* 93*  CO2 32 29 34* 32 32  GLUCOSE 101* 105* 105* 105* 110*  BUN 14 12 28* 25* 17  CREATININE 0.72 0.85 0.98 1.15* 0.91  CALCIUM 8.7* 8.7* 8.9 8.6* 9.0  MG 1.7 1.8  --  1.8 1.9   Liver Function Tests: No results for input(s): AST, ALT, ALKPHOS, BILITOT, PROT, ALBUMIN in the last 168 hours. CBG: No results for input(s): GLUCAP in the last 168 hours.  Discharge time spent: greater than 30  minutes.  Signed: Tawni Millers, MD [facility] 02/21/2021

## 2021-02-21 NOTE — Progress Notes (Signed)
Physical Therapy Treatment Patient Details Name: Patricia [name] MRN: [id number] DOB: October 04, 1950 Today's Date: 02/21/2021   History of Present Illness 71 y.o. female admitted 1/24 with DOE and tachycardia. Pt with acute on chronic respiratory failure and CHF. 2/6 and 2/13 DCCV. PMHx: HTN, back surgery, ORIF radius and ulna fx, Lung CA with thrombocytopenia, Afib.    PT Comments    Pt pleasant and reports fatigue after DCCV but happy HR has remained NSR. Pt with increased gait tolerance today with continued work toward pt goal of returning to independent gait with need for UE assist with all standing. Pt educated for repeated sit to stands again as stating she forgot and for the importance to continue for progression at home. Pt appropriate for return home.     Recommendations for follow up therapy are one component of a multi-disciplinary discharge planning process, led by the attending physician.  Recommendations may be updated based on patient status, additional functional criteria and insurance authorization.  Follow Up Recommendations  Home health PT     Assistance Recommended at Discharge PRN  Patient can return home with the following Assist for transportation;Assistance with cooking/housework;A little help with bathing/dressing/bathroom   Equipment Recommendations  None recommended by PT    Recommendations for Other Services       Precautions / Restrictions Precautions Precautions: Fall Precaution Comments: watch sats and HR Restrictions Weight Bearing Restrictions: No     Mobility  Bed Mobility Overal bed mobility: Modified Independent             General bed mobility comments: HOB 20 degrees with rail    Transfers Overall transfer level: Needs assistance   Transfers: Sit to/from Stand Sit to Stand: Supervision           General transfer comment: cues for hand placement with reliance on UE support, Attempted without UE with need for Min assist for  anterior translation and rise. Pt performed 5 repeated sit to stands with reliance on UE this session    Ambulation/Gait Ambulation/Gait assistance: Supervision Gait Distance (Feet): 400 Feet Assistive device: Rolling walker (2 wheels), 1 person hand held assist Gait Pattern/deviations: Step-through pattern, Trunk flexed   Gait velocity interpretation: 1.31 - 2.62 ft/sec, indicative of limited community ambulator   General Gait Details: cues for posture and proximity to RW. Pt able to progress gait distance this session and even attempt 40' without RW use with need for single UE support on hand rail to maintain balance. HR up to 102 NSR with gait with sats 90-96% on 2L   Stairs             Wheelchair Mobility    Modified Rankin (Stroke Patients Only)       Balance Overall balance assessment: Mild deficits observed, not formally tested                                          Cognition Arousal/Alertness: Awake/alert Behavior During Therapy: WFL for tasks assessed/performed Overall Cognitive Status: Within Functional Limits for tasks assessed                                          Exercises      General Comments        Pertinent Vitals/Pain Pain Assessment Pain  Assessment: No/denies pain    Home Living                          Prior Function            PT Goals (current goals can now be found in the care plan section) Progress towards PT goals: Progressing toward goals    Frequency    Min 3X/week      PT Plan Current plan remains appropriate    Co-evaluation              AM-PAC PT "6 Clicks" Mobility   Outcome Measure  Help needed turning from your back to your side while in a flat bed without using bedrails?: None Help needed moving from lying on your back to sitting on the side of a flat bed without using bedrails?: None Help needed moving to and from a bed to a chair (including a  wheelchair)?: A Little Help needed standing up from a chair using your arms (e.g., wheelchair or bedside chair)?: A Little Help needed to walk in hospital room?: A Little Help needed climbing 3-5 steps with a railing? : A Little 6 Click Score: 20    End of Session Equipment Utilized During Treatment: Oxygen Activity Tolerance: Patient tolerated treatment well Patient left: in chair;with call bell/phone within reach Nurse Communication: Mobility status PT Visit Diagnosis: Other abnormalities of gait and mobility (R26.89);Muscle weakness (generalized) (M62.81)     Time: 3545-6256 PT Time Calculation (min) (ACUTE ONLY): 24 min  Charges:  $Gait Training: 8-22 mins $Therapeutic Activity: 8-22 mins                     Sulamita Lafountain P, PT Acute Rehabilitation Services Pager: [phone] Office: [place] 02/21/2021, 11:51 AM

## 2021-02-22 ENCOUNTER — Ambulatory Visit: Payer: Medicare HMO | Admitting: Physician Assistant

## 2021-02-22 ENCOUNTER — Encounter (HOSPITAL_COMMUNITY): Payer: Self-pay | Admitting: Cardiovascular Disease

## 2021-02-22 ENCOUNTER — Telehealth: Payer: Self-pay

## 2021-02-22 NOTE — Telephone Encounter (Signed)
Pt called wanting to schedule her Chest CT. I have provided her with the contact number to Centralized Scheduling. Pt expressed understanding of this information.

## 2021-02-24 ENCOUNTER — Ambulatory Visit (HOSPITAL_COMMUNITY)
Admission: RE | Admit: 2021-02-24 | Discharge: 2021-02-24 | Disposition: A | Discharge status: Home / Self Care | Payer: Medicare HMO | Source: Ambulatory Visit | Attending: Internal Medicine | Admitting: Internal Medicine

## 2021-02-24 ENCOUNTER — Other Ambulatory Visit: Payer: Self-pay

## 2021-02-24 DIAGNOSIS — C3490 Malignant neoplasm of unspecified part of unspecified bronchus or lung: Secondary | ICD-10-CM | POA: Diagnosis not present

## 2021-02-24 DIAGNOSIS — J90 Pleural effusion, not elsewhere classified: Secondary | ICD-10-CM | POA: Diagnosis not present

## 2021-02-24 DIAGNOSIS — I700 Atherosclerosis of aorta: Secondary | ICD-10-CM | POA: Diagnosis not present

## 2021-02-24 DIAGNOSIS — J439 Emphysema, unspecified: Secondary | ICD-10-CM | POA: Diagnosis not present

## 2021-02-24 MED ORDER — IOHEXOL 300 MG/ML  SOLN
75.0000 mL | Freq: Once | INTRAMUSCULAR | Status: AC | PRN
  Administered 2021-02-24: 75 mL via INTRAVENOUS

## 2021-02-27 ENCOUNTER — Other Ambulatory Visit: Payer: Self-pay

## 2021-02-27 ENCOUNTER — Inpatient Hospital Stay: Payer: Medicare HMO

## 2021-02-27 DIAGNOSIS — C3432 Malignant neoplasm of lower lobe, left bronchus or lung: Secondary | ICD-10-CM | POA: Diagnosis not present

## 2021-02-27 DIAGNOSIS — R000 Tachycardia, unspecified: Secondary | ICD-10-CM | POA: Diagnosis not present

## 2021-02-27 DIAGNOSIS — D6481 Anemia due to antineoplastic chemotherapy: Secondary | ICD-10-CM | POA: Diagnosis not present

## 2021-02-27 DIAGNOSIS — M8580 Other specified disorders of bone density and structure, unspecified site: Secondary | ICD-10-CM | POA: Diagnosis not present

## 2021-02-27 DIAGNOSIS — Z79899 Other long term (current) drug therapy: Secondary | ICD-10-CM | POA: Diagnosis not present

## 2021-02-27 DIAGNOSIS — Z7901 Long term (current) use of anticoagulants: Secondary | ICD-10-CM | POA: Diagnosis not present

## 2021-02-27 DIAGNOSIS — C3490 Malignant neoplasm of unspecified part of unspecified bronchus or lung: Secondary | ICD-10-CM

## 2021-02-27 LAB — CBC WITH DIFFERENTIAL (CANCER CENTER ONLY)
Abs Immature Granulocytes: 0.02 10*3/uL (ref 0.00–0.07)
Basophils Absolute: 0 10*3/uL (ref 0.0–0.1)
Basophils Relative: 1 %
Eosinophils Absolute: 0.1 10*3/uL (ref 0.0–0.5)
Eosinophils Relative: 1 %
HCT: 29.3 % — ABNORMAL LOW (ref 36.0–46.0)
Hemoglobin: 10.2 g/dL — ABNORMAL LOW (ref 12.0–15.0)
Immature Granulocytes: 1 %
Lymphocytes Relative: 9 %
Lymphs Abs: 0.3 10*3/uL — ABNORMAL LOW (ref 0.7–4.0)
MCH: 35.9 pg — ABNORMAL HIGH (ref 26.0–34.0)
MCHC: 34.8 g/dL (ref 30.0–36.0)
MCV: 103.2 fL — ABNORMAL HIGH (ref 80.0–100.0)
Monocytes Absolute: 0.4 10*3/uL (ref 0.1–1.0)
Monocytes Relative: 12 %
Neutro Abs: 2.8 10*3/uL (ref 1.7–7.7)
Neutrophils Relative %: 76 %
Platelet Count: 172 10*3/uL (ref 150–400)
RBC: 2.84 MIL/uL — ABNORMAL LOW (ref 3.87–5.11)
RDW: 23.7 % — ABNORMAL HIGH (ref 11.5–15.5)
WBC Count: 3.7 10*3/uL — ABNORMAL LOW (ref 4.0–10.5)
nRBC: 0 % (ref 0.0–0.2)

## 2021-02-27 LAB — CMP (CANCER CENTER ONLY)
ALT: 15 U/L (ref 0–44)
AST: 18 U/L (ref 15–41)
Albumin: 3.8 g/dL (ref 3.5–5.0)
Alkaline Phosphatase: 74 U/L (ref 38–126)
Anion gap: 4 — ABNORMAL LOW (ref 5–15)
BUN: 16 mg/dL (ref 8–23)
CO2: 37 mmol/L — ABNORMAL HIGH (ref 22–32)
Calcium: 8.8 mg/dL — ABNORMAL LOW (ref 8.9–10.3)
Chloride: 94 mmol/L — ABNORMAL LOW (ref 98–111)
Creatinine: 0.94 mg/dL (ref 0.44–1.00)
GFR, Estimated: >60 mL/min (ref >60)
Glucose, Bld: 99 mg/dL (ref 70–99)
Potassium: 4.5 mmol/L (ref 3.5–5.1)
Sodium: 135 mmol/L (ref 135–145)
Total Bilirubin: 0.3 mg/dL (ref 0.3–1.2)
Total Protein: 6.4 g/dL — ABNORMAL LOW (ref 6.5–8.1)

## 2021-02-27 NOTE — Progress Notes (Signed)
[facility] OFFICE PROGRESS NOTE  [name], MD [place] [address]  DIAGNOSIS: Limited stage (T2a, N2, M0) small cell lung cancer presented with left lower lobe lung mass in addition to left infrahilar and subcarinal lymphadenopathy diagnosed in October 2022.   PRIOR THERAPY: Systemic chemotherapy with cisplatin 80 mg/m2  on days 1 and etoposide 100 mg/m2 on days 1, 2, and 3 IV every 3 weeks. Last dose 01/17/21.  Status post 4 cycles.  Her dose of cisplatin was reduced to 60 Mg/M2 from cycle #3 and starting cycle #4 of etoposide was also reduced to 80 Mg/M2 secondary to intolerance and pancytopenia  CURRENT THERAPY: Observation  2) Plan to undergo PCI under the care of Dr. Lisbeth Renshaw in the near future   INTERVAL HISTORY: Lee Kalt 71 y.o. female returns to the clinic today for a follow-up visit accompanied by her husband.  The patient was last seen in clinic on 02/08/2021.  The patient completed her course of chemotherapy on 01/17/2021.  Unfortunately, in the interval since last being seen, the patient presented to the emergency room on 02/11/2021 for the chief complaint of severe acute dyspnea.  The patient was found to be in A-fib with RVR.  The patient was admitted from 02/11/21-02/21/2021 for the working diagnosis of acute on chronic heart failure complicated by a flutter with RVR. The patient has a close follow-up with cardiology on 03/06/2021.  The patient has been discharged home on 2 L of oxygen  Since being discharged in the hospital, she is feeling fatigued.  She denies any fever, chills, or night sweats. She lost weight while in the hospital. She reports dyspnea on exertion but she states it is close to her baseline. She denies significant cough. She estimates that she coughs 1x per day and produces clear "sticky" phlegm. Denies any chest pain or hemoptysis.  Denies any nausea, vomiting, diarrhea, or constipation.  Denies any headache or  visual changes.  She is expected to meet with Dr. Lisbeth Renshaw from radiation oncology tomorrow on 03/01/2021 to discuss prophylactic cranial irradiation.  The patient recently had a restaging CT scan performed.  She is here today for evaluation to review her scan results and for a more detailed discussion about her current condition and recommended treatment options.     MEDICAL HISTORY: Past Medical History:  Diagnosis Date   Anemia    Anxiety    GERD (gastroesophageal reflux disease)    Hyperlipidemia    Hypertension    IBS (irritable bowel syndrome)    Insomnia    Low back pain    Lung cancer ([facility]) 10/2020   TIA (transient ischemic attack) 10/2017   no deficits   Vitamin B12 deficiency    Vitamin D deficiency     ALLERGIES:  is allergic to augmentin [amoxicillin-pot clavulanate], levaquin [levofloxacin in d5w], magnesium hydroxide, oxycodone, and other.  MEDICATIONS:  Current Outpatient Medications  Medication Sig Dispense Refill   amiodarone (PACERONE) 200 MG tablet Take 2 tablets (400mg ) twice daily until February 18, then take 1 tablet (200mg ) twice daily until February 25, then continue with 1 tablet daily 60 tablet 0   carboxymethylcellulose (REFRESH PLUS) 0.5 % SOLN Place 1 drop into both eyes 3 (three) times daily as needed (for dryness).      carisoprodol (SOMA) 350 MG tablet Take 350 mg by mouth 3 (three) times daily.   5   Cholecalciferol (VITAMIN D3) 50 MCG (2000 UT) TABS Take 2,000  Units by mouth daily.     clobetasol ointment (TEMOVATE) 4.40 % Apply 1 application topically See admin instructions. Apply to vaginal area daily as directed     clonazePAM (KLONOPIN) 1 MG tablet Take 1 mg by mouth at bedtime.   3   digoxin (LANOXIN) 0.125 MG tablet Take 1 tablet (0.125 mg total) by mouth daily. 30 tablet 0   docusate sodium (COLACE) 100 MG capsule Take 1 capsule (100 mg total) by mouth 2 (two) times daily. (Patient taking differently: Take 100 mg by mouth 2 (two) times daily as  needed for mild constipation or moderate constipation.) 10 capsule 0   ELIQUIS 5 MG TABS tablet TAKE 1 TABLET(5 MG) BY MOUTH TWICE DAILY (Patient taking differently: Take 5 mg by mouth 2 (two) times daily.) 60 tablet 6   escitalopram (LEXAPRO) 10 MG tablet Take 1 tablet (10 mg total) by mouth daily. 30 tablet 2   fluticasone (FLONASE) 50 MCG/ACT nasal spray Place 2 sprays into both nostrils daily as needed for allergies or rhinitis.   5   furosemide (LASIX) 20 MG tablet Take 1 tablet (20 mg total) by mouth daily. Take only if weight goes above 180 lbs. 30 tablet 2   gabapentin (NEURONTIN) 600 MG tablet Take 600 mg by mouth See admin instructions. Take 600 mg by mouth in the morning and at lunchtime     guaiFENesin-dextromethorphan (ROBITUSSIN DM) 100-10 MG/5ML syrup Take 10 mLs by mouth every 4 (four) hours as needed for cough. 118 mL 0   ipratropium (ATROVENT) 0.02 % nebulizer solution Take by nebulization 3 (three) times daily. As directed     levalbuterol (XOPENEX) 1.25 MG/3ML nebulizer solution Inhale into the lungs as directed.     lidocaine (XYLOCAINE) 2 % solution Use as directed 15 mLs in the mouth or throat every 6 (six) hours as needed for mouth pain. 100 mL 0   linaclotide (LINZESS) 145 MCG CAPS capsule Take 1 capsule (145 mcg total) by mouth daily at 4 PM. 30 capsule 0   magic mouthwash SOLN Take 5 mLs by mouth 4 (four) times daily as needed for mouth pain. Pt is allergic to Magnesium 240 mL 1   magnesium oxide (MAG-OX) 400 (240 Mg) MG tablet Take 1 tablet (400 mg total) by mouth daily. 30 tablet 1   memantine (NAMENDA) 10 MG tablet Take 1 tablet (10 mg total) by mouth 2 (two) times daily. 60 tablet 4   memantine (NAMENDA) 5 MG tablet Begin this prescription the first day of brain radiation. Week 1: take one tablet po qam. Week 2: take one tablet qam and qpm. Week 3: take two tablets qam, and one tablet po q pm. Week 4: take two tablets qam and qpm. Fill subsequent prescription q month. 70  tablet 0   metoprolol tartrate (LOPRESSOR) 100 MG tablet Take 1 tablet (100 mg total) by mouth 2 (two) times daily. 60 tablet 0   nystatin (MYCOSTATIN) 100000 UNIT/ML suspension Take 5 mLs (500,000 Units total) by mouth 4 (four) times daily. 440 mL 5   omeprazole (PRILOSEC) 20 MG capsule Take 20 mg by mouth daily before breakfast.      ondansetron (ZOFRAN-ODT) 8 MG disintegrating tablet Take 8 mg by mouth daily as needed for nausea.     Polyethyl Glycol-Propyl Glycol (SYSTANE) 0.4-0.3 % GEL ophthalmic gel Place 1 application into both eyes at bedtime.     polyethylene glycol (MIRALAX / GLYCOLAX) packet Take 34 g by mouth in the morning.  prochlorperazine (COMPAZINE) 10 MG tablet Take 1 tablet (10 mg total) by mouth every 6 (six) hours as needed for nausea or vomiting. 30 tablet 0   rosuvastatin (CRESTOR) 5 MG tablet Take 5 mg by mouth 3 (three) times a week. Mondays Wednesdays Fridays     traMADol (ULTRAM) 50 MG tablet Take 100 mg by mouth in the morning, at noon, and at bedtime.     zaleplon (SONATA) 10 MG capsule Take 10 mg by mouth at bedtime as needed (for interrupted sleep).      No current facility-administered medications for this visit.    SURGICAL HISTORY:  Past Surgical History:  Procedure Laterality Date   BACK SURGERY     BREAST EXCISIONAL BIOPSY Left 1997   benign   BRONCHIAL BIOPSY  10/18/2020   Procedure: BRONCHIAL BIOPSIES;  Surgeon: [name], DO;  Location: [facility];  Service: Pulmonary;;   BRONCHIAL BRUSHINGS  10/18/2020   Procedure: BRONCHIAL BRUSHINGS;  Surgeon: [name], DO;  Location: [facility];  Service: Pulmonary;;   BRONCHIAL NEEDLE ASPIRATION BIOPSY  10/18/2020   Procedure: BRONCHIAL NEEDLE ASPIRATION BIOPSIES;  Surgeon: [name], DO;  Location: [facility];  Service: Pulmonary;;   BRONCHIAL WASHINGS  10/18/2020   Procedure: BRONCHIAL WASHINGS;  Surgeon: [name], DO;  Location: [facility];  Service: Pulmonary;;    CARDIOVERSION N/A 02/13/2021   Procedure: CARDIOVERSION;  Surgeon: Werner Lean, MD;  Location: [facility];  Service: Cardiovascular;  Laterality: N/A;   CARDIOVERSION N/A 02/20/2021   Procedure: CARDIOVERSION;  Surgeon: Skeet Latch, MD;  Location: [facility];  Service: Cardiovascular;  Laterality: N/A;   hemorrhoidecotmy     HERNIA MESH REMOVAL     OPEN REDUCTION INTERNAL FIXATION (ORIF) DISTAL RADIAL FRACTURE Right 11/19/2019   Procedure: OPEN REDUCTION INTERNAL FIXATION (ORIF) DISTAL RADIUS AND ULNA FRACTURE WITH REPAIR AS NECESSARY;  Surgeon: Roseanne Kaufman, MD;  Location: [facility];  Service: Orthopedics;  Laterality: Right;  2 hrs Block with IV sedation   ORIF RADIUS & ULNA FRACTURES     TONSILLECTOMY     VIDEO BRONCHOSCOPY WITH ENDOBRONCHIAL NAVIGATION Left 10/18/2020   Procedure: VIDEO BRONCHOSCOPY WITH ENDOBRONCHIAL NAVIGATION;  Surgeon: [name], DO;  Location: [facility];  Service: Pulmonary;  Laterality: Left;  ION   VIDEO BRONCHOSCOPY WITH ENDOBRONCHIAL ULTRASOUND Bilateral 10/18/2020   Procedure: VIDEO BRONCHOSCOPY WITH ENDOBRONCHIAL ULTRASOUND;  Surgeon: [name], DO;  Location: [facility];  Service: Pulmonary;  Laterality: Bilateral;   VIDEO BRONCHOSCOPY WITH RADIAL ENDOBRONCHIAL ULTRASOUND  10/18/2020   Procedure: VIDEO BRONCHOSCOPY WITH RADIAL ENDOBRONCHIAL ULTRASOUND;  Surgeon: [name], DO;  Location: [facility];  Service: Pulmonary;;    REVIEW OF SYSTEMS:   Review of Systems  Constitutional: Positive for fatigue and weight loss. Negative for  chills and fever.  HENT: Negative for mouth sores, nosebleeds, sore throat and trouble swallowing.   Eyes: Negative for eye problems and icterus.  Respiratory: Positive for baseline dyspnea on exertion. Mild cough. Negative for hemoptysis and wheezing.   Cardiovascular: Negative for chest pain and leg swelling.  Gastrointestinal: Negative for abdominal pain, constipation, diarrhea,  nausea and vomiting.  Genitourinary: Negative for bladder incontinence, difficulty urinating, dysuria, frequency and hematuria.   Musculoskeletal: Negative for back pain, gait problem, neck pain and neck stiffness.  Skin: Negative for itching and rash.  Neurological: Negative for dizziness, extremity weakness, gait problem, headaches, light-headedness and seizures.  Hematological: Negative for adenopathy. Does not bruise/bleed easily.  Psychiatric/Behavioral: Negative for confusion,  depression and sleep disturbance. The patient is not nervous/anxious.     PHYSICAL EXAMINATION:  Blood pressure 117/63, pulse (!) 57, temperature 98.1 F (36.7 C), temperature source Tympanic, resp. rate 18, weight 176 lb 5 oz (80 kg), SpO2 93 %.  ECOG PERFORMANCE STATUS: 2-3  Physical Exam  Constitutional: Oriented to person, place, and time and chronically ill appearing female and in no distress. HENT:  Head: Normocephalic and atraumatic.  Mouth/Throat: Oropharynx is clear and moist. No oropharyngeal exudate.  Eyes: Conjunctivae are normal. Right eye exhibits no discharge. Left eye exhibits no discharge. No scleral icterus.  Neck: Normal range of motion. Neck supple.  Cardiovascular: Normal rate, regular rhythm, normal heart sounds and intact distal pulses.   Pulmonary/Chest: Effort normal and breath sounds normal. No respiratory distress. No wheezes. No rales.  Abdominal: Soft. Bowel sounds are normal. Exhibits no distension and no mass. There is no tenderness.  Musculoskeletal: Normal range of motion. Exhibits no edema.  Lymphadenopathy:    No cervical adenopathy.  Neurological: Alert and oriented to person, place, and time. Exhibits muscle wasting. Examined in the wheelchair.  Skin: Skin is warm and dry. No rash noted. Not diaphoretic. No erythema. No pallor.  Psychiatric: Mood, memory and judgment normal.  Vitals reviewed.  LABORATORY DATA: Lab Results  Component Value Date   WBC 3.7 (L)  02/27/2021   HGB 10.2 (L) 02/27/2021   HCT 29.3 (L) 02/27/2021   MCV 103.2 (H) 02/27/2021   PLT 172 02/27/2021      Chemistry      Component Value Date/Time   NA 135 02/27/2021 1359   K 4.5 02/27/2021 1359   CL 94 (L) 02/27/2021 1359   CO2 37 (H) 02/27/2021 1359   BUN 16 02/27/2021 1359   CREATININE 0.94 02/27/2021 1359      Component Value Date/Time   CALCIUM 8.8 (L) 02/27/2021 1359   ALKPHOS 74 02/27/2021 1359   AST 18 02/27/2021 1359   ALT 15 02/27/2021 1359   BILITOT 0.3 02/27/2021 1359       RADIOGRAPHIC STUDIES:  CT Chest W Contrast  Result Date: 02/25/2021 CLINICAL DATA:  71 year old female with history of small cell lung cancer. Treatment completed in January 2023. Staging examination. EXAM: CT CHEST WITH CONTRAST TECHNIQUE: Multidetector CT imaging of the chest was performed during intravenous contrast administration. RADIATION DOSE REDUCTION: This exam was performed according to the departmental dose-optimization program which includes automated exposure control, adjustment of the mA and/or kV according to patient size and/or use of iterative reconstruction technique. CONTRAST:  24mL OMNIPAQUE IOHEXOL 300 MG/ML  SOLN COMPARISON:  Chest CTA 02/11/2021. FINDINGS: Cardiovascular: Heart size is normal. There is no significant pericardial fluid, thickening or pericardial calcification. There is aortic atherosclerosis, as well as atherosclerosis of the great vessels of the mediastinum and the coronary arteries, including calcified atherosclerotic plaque in the left main, left anterior descending, left circumflex and right coronary arteries. Mediastinum/Nodes: No pathologically enlarged mediastinal or hilar lymph nodes. Esophagus is unremarkable in appearance. No axillary lymphadenopathy. Lungs/Pleura: No residual soft tissue nodule or mass noted in the perihilar aspect of the left lower lobe at the site of the treated neoplasm. There are areas of septal thickening and nodular  architectural distortion in the left lung, separate from the originally identified lesion. This is most evident in the inferior segment of the lingula, slightly improved compared to the prior study from 02/11/2021, favored to represent an area of evolving post infectious scarring. Other areas of architectural distortion in the  left lower lobe were previously obscured by atelectasis on the recent prior examination, and also likely represent areas of evolving post infectious or inflammatory scarring. Diffuse bronchial wall thickening with moderate centrilobular and paraseptal emphysema. Trace right and small left pleural effusions, decreased compared to the prior examination. Upper Abdomen: Aortic atherosclerosis. Musculoskeletal: Compression fracture of L2 with post vertebroplasty changes and approximately 25% loss of anterior vertebral body height. There are no aggressive appearing lytic or blastic lesions noted in the visualized portions of the skeleton. IMPRESSION: 1. Today's study demonstrates a positive response to therapy with regression of the primary left lower lobe mass and no residual metastatic lymphadenopathy identified in the thorax. 2. Decreasing bilateral pleural effusions, trace on the right and small on the left. 3. Areas of presumably resolving post infectious or inflammatory scarring in the left lung are noted on today's examination. Attention on follow-up studies is recommended to ensure continued regression. 4. Diffuse bronchial wall thickening with moderate centrilobular and paraseptal emphysema; imaging findings suggestive of underlying COPD. 5. Aortic atherosclerosis, in addition to left main and three-vessel coronary artery disease. Please note that although the presence of coronary artery calcium documents the presence of coronary artery disease, the severity of this disease and any potential stenosis cannot be assessed on this non-gated CT examination. Assessment for potential risk factor  modification, dietary therapy or pharmacologic therapy may be warranted, if clinically indicated. Aortic Atherosclerosis (ICD10-I70.0) and Emphysema (ICD10-J43.9). Electronically Signed   By: Vinnie Langton M.D.   On: 02/25/2021 09:41   CT Angio Chest PE W and/or Wo Contrast  Result Date: 02/11/2021 CLINICAL DATA:  71 year old female with lung cancer. Shortness of breath. Blood transfusion yesterday. Atrial fibrillation with RVR. EXAM: CT ANGIOGRAPHY CHEST WITH CONTRAST TECHNIQUE: Multidetector CT imaging of the chest was performed using the standard protocol during bolus administration of intravenous contrast. Multiplanar CT image reconstructions and MIPs were obtained to evaluate the vascular anatomy. RADIATION DOSE REDUCTION: This exam was performed according to the departmental dose-optimization program which includes automated exposure control, adjustment of the mA and/or kV according to patient size and/or use of iterative reconstruction technique. CONTRAST:  125mL OMNIPAQUE IOHEXOL 350 MG/ML SOLN COMPARISON:  Chest CT without contrast 12/27/2020. PET-CT 11/04/2020 FINDINGS: Cardiovascular: Good contrast bolus timing in the pulmonary arterial tree. Mild respiratory motion. No focal filling defect identified in the pulmonary arteries to suggest acute pulmonary embolism. Stable cardiac size, within normal limits. No pericardial effusion. Little aortic contrast. Aortic and coronary artery calcified atherosclerosis. Mediastinum/Nodes: No discrete mediastinal mass or lymphadenopathy. Lungs/Pleura: Moderate bilateral layering pleural effusions have mildly progressed since December. Cannot exclude complex fluid density, exudate. Compressive, enhancing atelectasis in both lungs. Superimposed atelectatic changes to the central airways which remain patent. Underlying centrilobular emphysema most apparent in the upper lobes. New compared to December curvilinear and masslike opacity in the lingula, but seems to be  enhancing similar to the other atelectatic lung (series 8, image 68. Bronchopneumonia felt less likely. Primary left infrahilar tumor not well evaluated due to presence of effusions. Upper Abdomen: Visible liver, gallbladder, spleen, pancreas, adrenal glands and kidneys appear stable since December. Negative visible bowel. No free air or free fluid in the visible upper abdomen. Musculoskeletal: Previously augmented upper lumbar compression fracture. Osteopenia. No acute or suspicious osseous lesion identified. Review of the MIP images confirms the above findings. IMPRESSION: 1. No evidence of acute pulmonary embolus. 2. Progressed since December and now moderate bilateral layering pleural effusions, cannot exclude exudative fluid. Associated compressive atelectasis, and probably  new round atelectasis in the lingula, with Bronchopneumonia felt less likely. Known primary left lower lobe tumor not well evaluated due to presence of effusions. 3. Aortic Atherosclerosis (ICD10-I70.0) and Emphysema (ICD10-J43.9). Electronically Signed   By: Genevie Ann M.D.   On: 02/11/2021 09:46   MR BRAIN W WO CONTRAST  Result Date: 02/14/2021 CLINICAL DATA:  Small cell lung cancer (SCLC), staging EXAM: MRI HEAD WITHOUT AND WITH CONTRAST TECHNIQUE: Multiplanar, multiecho pulse sequences of the brain and surrounding structures were obtained without and with intravenous contrast. CONTRAST:  44mL GADAVIST GADOBUTROL 1 MMOL/ML IV SOLN COMPARISON:  10/16/2020 FINDINGS: Brain: There is no acute infarction or intracranial hemorrhage. There is no intracranial mass, mass effect, or edema. There is no hydrocephalus or extra-axial fluid collection. Ventricles and sulci are stable in size and configuration. Patchy T2 hyperintensity in the supratentorial white matter is nonspecific but may reflect mild chronic microvascular ischemic changes. No abnormal enhancement. Vascular: Major vessel flow voids at the skull base are preserved. Skull and upper  cervical spine: Normal marrow signal is preserved. Sinuses/Orbits: Focal anterior right ethmoid air cell opacification. Orbits are unremarkable. Other: Sella is unremarkable. Patchy left greater than right mastoid fluid opacification. IMPRESSION: No evidence of intracranial metastatic disease. Electronically Signed   By: Macy Mis M.D.   On: 02/14/2021 13:07   DG Chest Portable 1 View  Result Date: 02/11/2021 CLINICAL DATA:  71 year old female with shortness of breath. Lung cancer. EXAM: PORTABLE CHEST 1 VIEW COMPARISON:  Chest CT 12/27/2020 and earlier. FINDINGS: Portable AP semi upright view at 0738 hours. Continued veiling opacity at both lung bases greater on the right. Bilateral pleural effusions and atelectasis demonstrated in December. Underlying emphysema. Ventilation has not significantly changed. Mediastinal contours remain normal. Visualized tracheal air column is within normal limits. No pneumothorax. No acute osseous abnormality identified. Paucity of bowel gas in the upper abdomen. IMPRESSION: Continued left greater than right pleural effusions and associated lung base opacity which most resembled atelectasis on December CT. Underlying Emphysema (ICD10-J43.9). No new cardiopulmonary abnormality. Electronically Signed   By: Genevie Ann M.D.   On: 02/11/2021 08:04   ECHOCARDIOGRAM LIMITED  Result Date: 02/13/2021    ECHOCARDIOGRAM LIMITED REPORT   Patient Name:   KISHANA BATTEY Date of Exam: 02/13/2021 Medical Rec #:  [id number]          Height:       66.0 in Accession #:    [id number]         Weight:       185.2 lb Date of Birth:  [birth year]           BSA:          1.935 m Patient Age:    89 years           BP:           117/80 mmHg Patient Gender: F                  HR:           120 bpm. Exam Location:  Inpatient Procedure: 2D Echo, Limited Echo, Cardiac Doppler and Color Doppler Indications:    CHF  History:        Patient has prior history of Echocardiogram examinations.                  Arrythmias:Atrial Fibrillation; Risk Factors:Hypertension and                 Dyslipidemia.  Sonographer:    Jyl Heinz Referring Phys: [place]  1. Left ventricular ejection fraction, by estimation, is 45 to 50%. The left ventricle has mildly decreased function. The left ventricle demonstrates global hypokinesis.  2. The right ventricular size is mildly enlarged. There is mildly elevated pulmonary artery systolic pressure. The estimated right ventricular systolic pressure is 85.2 mmHg.  3. Right atrial size was mildly dilated.  4. Moderate pleural effusion in the left lateral region.  5. Tricuspid valve regurgitation is moderate.  6. The inferior vena cava is dilated in size with <50% respiratory variability, suggesting right atrial pressure of 15 mmHg. Comparison(s): Prior images reviewed side by side. The left ventricular function is worsened. FINDINGS  Left Ventricle: Left ventricular ejection fraction, by estimation, is 45 to 50%. The left ventricle has mildly decreased function. The left ventricle demonstrates global hypokinesis. Right Ventricle: The right ventricular size is mildly enlarged. There is mildly elevated pulmonary artery systolic pressure. The tricuspid regurgitant velocity is 2.51 m/s, and with an assumed right atrial pressure of 15 mmHg, the estimated right ventricular systolic pressure is 77.8 mmHg. Left Atrium: Left atrial size was normal in size. Right Atrium: Right atrial size was mildly dilated. Tricuspid Valve: The tricuspid valve is normal in structure. Tricuspid valve regurgitation is moderate. Aortic Valve: Aortic valve peak gradient measures 3.4 mmHg. Pulmonic Valve: Pulmonic valve regurgitation is trivial. Venous: The inferior vena cava is dilated in size with less than 50% respiratory variability, suggesting right atrial pressure of 15 mmHg. Additional Comments: There is a moderate pleural effusion in the left lateral region. LEFT VENTRICLE PLAX 2D LVIDd:          4.20 cm     Diastology LVIDs:         3.10 cm     LV e' medial:    9.36 cm/s LV PW:         0.90 cm     LV E/e' medial:  8.4 LV IVS:        1.00 cm     LV e' lateral:   11.10 cm/s LVOT diam:     2.00 cm     LV E/e' lateral: 7.1 LV SV:         37 LV SV Index:   19 LVOT Area:     3.14 cm  LV Volumes (MOD) LV vol d, MOD A2C: 61.2 ml LV vol d, MOD A4C: 56.5 ml LV vol s, MOD A2C: 25.5 ml LV vol s, MOD A4C: 24.2 ml LV SV MOD A2C:     35.7 ml LV SV MOD A4C:     56.5 ml LV SV MOD BP:      34.0 ml RIGHT VENTRICLE            IVC RV S prime:     9.36 cm/s  IVC diam: 2.80 cm TAPSE (M-mode): 1.4 cm LEFT ATRIUM         Index LA diam:    3.10 cm 1.60 cm/m  AORTIC VALVE AV Area (Vmax): 2.60 cm AV Vmax:        91.70 cm/s AV Peak Grad:   3.4 mmHg LVOT Vmax:      76.00 cm/s LVOT Vmean:     54.700 cm/s LVOT VTI:       0.119 m  AORTA Ao Root diam: 3.30 cm Ao Asc diam:  3.10 cm MITRAL VALVE  TRICUSPID VALVE MV Area (PHT): 6.54 cm    TR Peak grad:   25.2 mmHg MV Decel Time: 116 msec    TR Vmax:        251.00 cm/s MV E velocity: 78.40 cm/s                            SHUNTS                            Systemic VTI:  0.12 m                            Systemic Diam: 2.00 cm Candee Furbish MD Electronically signed by Candee Furbish MD Signature Date/Time: 02/13/2021/3:15:15 PM    Final      ASSESSMENT/PLAN:  This is a very pleasant 71 year old Caucasian female recently diagnosed with limited stage (T2 a, N 2, M0) small cell lung cancer presented with left lower lobe lung mass in addition to left infrahilar and subcarinal lymphadenopathy diagnosed in October 2022.  The patient completed a course of systemic chemotherapy with cisplatin and etoposide.  She is status post 4 cycles with concurrent radiation.  Starting from cycle #4, her cisplatin was reduced to 60 mg per metered squared on days 1 and etoposide 80 mg per metered squared due to fatigue, weakness, and pancytopenia.  Patient was recently hospitalized for acute on  chronic heart failure with a flutter with RVR.  She is scheduled to follow-up with cardiology in 03/06/2021.    The patient recently had a restaging CT scan performed.  Dr. Julien Nordmann personally and independently reviewed the scan discussed results with the patient today.  The scan showed a positive response to treatment.  Dr. Julien Nordmann recommended the patient continue on observation with a restaging CT scan of the chest in 3 months.  In the meantime, the patient is scheduled to see radiation oncology tomorrow to discuss prophylactic cranial radiation under the care of Dr. Lisbeth Renshaw.  She will continue to follow with cardiology as scheduled on 03/06/21.   The patient was advised to call immediately if she has any concerning symptoms in the interval. The patient voices understanding of current disease status and treatment options and is in agreement with the current care plan. All questions were answered. The patient knows to call the clinic with any problems, questions or concerns. We can certainly see the patient much sooner if necessary       Orders Placed This Encounter  Procedures   CT Chest W Contrast    Standing Status:   Future    Standing Expiration Date:   02/28/2022    Order Specific Question:   If indicated for the ordered procedure, I authorize the administration of contrast media per Radiology protocol    Answer:   Yes    Order Specific Question:   Preferred imaging location?    Answer:   [facility]   CBC with Differential ([place] Only)    Standing Status:   Future    Standing Expiration Date:   02/28/2022   CMP ([place] only)    Standing Status:   Future    Standing Expiration Date:   02/28/2022      Tobe Sos Annalee Meyerhoff, PA-C 02/28/21  ADDENDUM: Hematology/Oncology Attending: I had a face-to-face encounter with the patient today.  I reviewed her record, lab,  scan and recommended her care plan.  This is a very pleasant 71 years old white female with  limited stage small cell lung cancer diagnosed in October 2022 status post a course of systemic chemotherapy with cisplatin and etoposide for 4 cycles concurrent with radiation. The patient has a rough time tolerating this treatment with significant fatigue and weakness as well as few hospitalization for pancytopenia. She had repeat CT scan of the chest performed recently.  I personally and independently reviewed the scan images and discussed the results with the patient and her husband. Her scan showed significant improvement of her disease with regulation of the left lower lobe lung mass and no evidence of mediastinal lymphadenopathy. I recommended for the patient to see Dr. Lisbeth Renshaw for consideration of prophylactic cranial irradiation. I will see her back for follow-up visit in 3 months with repeat CT scan of the chest for restaging of her disease. The patient was advised to call immediately if she has any other concerning symptoms in the interval. The total time spent in the appointment was 30 minutes. Disclaimer: This note was dictated with voice recognition software. Similar sounding words can inadvertently be transcribed and may be missed upon review. Eilleen Kempf, MD 02/28/21

## 2021-02-28 ENCOUNTER — Inpatient Hospital Stay: Payer: Medicare HMO | Admitting: Physician Assistant

## 2021-02-28 VITALS — BP 117/63 | HR 57 | Temp 98.1°F | Resp 18 | Wt 176.3 lb

## 2021-02-28 DIAGNOSIS — M8580 Other specified disorders of bone density and structure, unspecified site: Secondary | ICD-10-CM | POA: Diagnosis not present

## 2021-02-28 DIAGNOSIS — Z79899 Other long term (current) drug therapy: Secondary | ICD-10-CM | POA: Diagnosis not present

## 2021-02-28 DIAGNOSIS — C3490 Malignant neoplasm of unspecified part of unspecified bronchus or lung: Secondary | ICD-10-CM | POA: Diagnosis not present

## 2021-02-28 DIAGNOSIS — Z7901 Long term (current) use of anticoagulants: Secondary | ICD-10-CM | POA: Diagnosis not present

## 2021-02-28 DIAGNOSIS — R000 Tachycardia, unspecified: Secondary | ICD-10-CM | POA: Diagnosis not present

## 2021-02-28 DIAGNOSIS — C3432 Malignant neoplasm of lower lobe, left bronchus or lung: Secondary | ICD-10-CM | POA: Diagnosis not present

## 2021-02-28 DIAGNOSIS — D6481 Anemia due to antineoplastic chemotherapy: Secondary | ICD-10-CM | POA: Diagnosis not present

## 2021-03-01 ENCOUNTER — Ambulatory Visit
Admission: RE | Admit: 2021-03-01 | Discharge: 2021-03-01 | Disposition: A | Discharge status: Home / Self Care | Payer: Medicare HMO | Source: Ambulatory Visit | Attending: Radiation Oncology | Admitting: Radiation Oncology

## 2021-03-01 ENCOUNTER — Other Ambulatory Visit: Payer: Self-pay

## 2021-03-01 ENCOUNTER — Ambulatory Visit: Payer: Medicare HMO | Admitting: Physician Assistant

## 2021-03-01 ENCOUNTER — Telehealth: Payer: Self-pay | Admitting: Cardiology

## 2021-03-01 DIAGNOSIS — C771 Secondary and unspecified malignant neoplasm of intrathoracic lymph nodes: Secondary | ICD-10-CM | POA: Insufficient documentation

## 2021-03-01 DIAGNOSIS — Z298 Encounter for other specified prophylactic measures: Secondary | ICD-10-CM | POA: Diagnosis not present

## 2021-03-01 DIAGNOSIS — Z5189 Encounter for other specified aftercare: Secondary | ICD-10-CM | POA: Insufficient documentation

## 2021-03-01 DIAGNOSIS — E876 Hypokalemia: Secondary | ICD-10-CM | POA: Insufficient documentation

## 2021-03-01 DIAGNOSIS — Z5111 Encounter for antineoplastic chemotherapy: Secondary | ICD-10-CM | POA: Insufficient documentation

## 2021-03-01 DIAGNOSIS — C3432 Malignant neoplasm of lower lobe, left bronchus or lung: Secondary | ICD-10-CM | POA: Diagnosis not present

## 2021-03-01 DIAGNOSIS — T451X5A Adverse effect of antineoplastic and immunosuppressive drugs, initial encounter: Secondary | ICD-10-CM | POA: Insufficient documentation

## 2021-03-01 DIAGNOSIS — Z510 Encounter for antineoplastic radiation therapy: Secondary | ICD-10-CM | POA: Insufficient documentation

## 2021-03-01 DIAGNOSIS — D701 Agranulocytosis secondary to cancer chemotherapy: Secondary | ICD-10-CM | POA: Diagnosis not present

## 2021-03-01 NOTE — Telephone Encounter (Signed)
Spoke to [place] and then pt.  Pt states that on her home scale she was 171 pds yesterday and 174 this morning. Denies any swelling, except in her "belly". States there is no change in her breathing. Denies CP, dizziness/light headedness, near syncope/syncope. States she has Kardia mobile and it is showing her in sinus rhythm.  She is cannot feel her AFib when it does occur. She reports that she is really tired and sometimes forcing herself just to walk to the bathroom. She did take Lasix today.   Will forward to Dr. Curt Bears for review/advisement. Pt recently in hospital for afib exacerbation/acute HF

## 2021-03-01 NOTE — Telephone Encounter (Signed)
Courtney from [facility] is calling to report that the patient has gained 3 lbs in one day. Patient weighed 171 on 02/20 and 174 on 02/21. Reports no specific locations of swelling and no changing in breathing. Was advised to call incase of Dr. Curt Bears wanting to make changes in regards to this. Patient is currently taking furosemide as advised.

## 2021-03-03 ENCOUNTER — Encounter (HOSPITAL_COMMUNITY): Payer: Medicare HMO

## 2021-03-03 DIAGNOSIS — I5033 Acute on chronic diastolic (congestive) heart failure: Secondary | ICD-10-CM | POA: Diagnosis not present

## 2021-03-03 DIAGNOSIS — I10 Essential (primary) hypertension: Secondary | ICD-10-CM | POA: Diagnosis not present

## 2021-03-03 DIAGNOSIS — G4700 Insomnia, unspecified: Secondary | ICD-10-CM | POA: Diagnosis not present

## 2021-03-03 DIAGNOSIS — E7800 Pure hypercholesterolemia, unspecified: Secondary | ICD-10-CM | POA: Diagnosis not present

## 2021-03-05 NOTE — Progress Notes (Addendum)
HEART & VASCULAR TRANSITION OF CARE CONSULT NOTE     Referring Physician: Primary Care: Primary Cardiologist:  HPI: Referred to clinic by Dr. Cathlean Sauer with [facility] for heart failure consultation. 71 y.o. female with history of SVT, atrial fibrillation/flutter, SIADH, small cell lung cancer LLL recently completed chemo with cisplatin + etoposide and undergoing radiation, prior tobacco use with suspected COPD, chronic respiratory failure on 2L home O2, hx pancytopenia d/t chemo.    10/01/20 hospitalized with syncope, she was ill with recurrent loose stools for days more then her usual, she was hyponatremic, orthostatic vitals inconsistent. She did have SVT (probably an ATach) though syncope felt 2/2 hypovolemia. Lopressor increased. Echo 09/22: EF 60-65%, RV okay   Oct 2022 readmitted with hyponatremia thought due to SIADH from lung cancer.     ER visit 11/11/20 with palpitations, tachycardia, EKG was ST 104bpm, was hypokalemic (3.4) and low mag (1.2), started on K+ supplementation. Developed Afib w/RVR and given IV lopressor x2 and discharged from the ER back in [place]  She was hospitalized d/t esophagitis (possibly related to XRT) in December 2022. She went into atrial fibrillation with RVR. She was anemic and thrombocytopenic. Anticoagulation stopped d/t severe anemia and flecainide also discontinued. Discharged on diltiazem and metoprolol.   Admitted on 02/11/21 with atrial flutter with RVR and hypoxia. CT chest with no PE, bilateral pleural effusion and groundglass opacities. Limited echo during admit with LVEF 45-50%, mildly enlarged RV, PASP 40 mmHg. BNP 420. She diuresed with IV furosemide then switched to po once euvolemic. Started on IV amiodarone. Underwent successful DCCV 02/06 then went back into AFL with RVR 02/08. Beta blocker adjusted and digoxin added. Underwent DCCV again on 02/13 and transitioned to po amiodarone.  Salt tablets for hyponatremia from SIADH held d/t reduced LV function.  Discharge weight 182 lb.  Has been doing well since hospital discharge. Home weight day of discharge was 171 lb, stable at 171 lb today. Took 1 dose of PRN lasix when weight went up to 174 lb about a week ago. Notes some dyspnea with activity which is chronic and has been stable. Reports she was very weak after hospitalization in December and is slowly regaining strength. Ambulating with a walker. No orthopnea, PND or leg edema. Taking all medications as prescribed. SBP may range between 90s - 120. Denies associated dizziness. Has good support from husband at home.   Cardiac Testing  Limited echo, 02/13/21: EF 45-50%, RV mildly enlarged, PASP 40 mmHg, moderate TR, dilated IVC with estimated RAP 15 mmHg  Echo, 10/01/2020: LVEF 60-65%, RV okay   Review of Systems: [y] = yes, [ ]  = no   General: Weight gain [ ] ; Weight loss [ ] ; Anorexia [ ] ; Fatigue [Y]; Fever [ ] ; Chills [ ] ; Weakness [Y]  Cardiac: Chest pain/pressure [ ] ; Resting SOB [ ] ; Exertional SOB [Y]; Orthopnea [ ] ; Pedal Edema [ ] ; Palpitations [ ] ; Syncope [ ] ; Presyncope [ ] ; Paroxysmal nocturnal dyspnea[ ]   Pulmonary: Cough [ ] ; Wheezing[ ] ; Hemoptysis[ ] ; Sputum [ ] ; Snoring [ ]   GI: Vomiting[ ] ; Dysphagia[ ] ; Melena[ ] ; Hematochezia [ ] ; Heartburn[ ] ; Abdominal pain [ ] ; Constipation [ ] ; Diarrhea [ ] ; BRBPR [ ]   GU: Hematuria[ ] ; Dysuria [ ] ; Nocturia[ ]   Vascular: Pain in legs with walking [ ] ; Pain in feet with lying flat [ ] ; Non-healing sores [ ] ; Stroke [ ] ; TIA [ ] ; Slurred speech [ ] ;  Neuro: Headaches[ ] ; Vertigo[ ] ; Seizures[ ] ;  Paresthesias[ ] ;Blurred vision [ ] ; Diplopia [ ] ; Vision changes [ ]   Ortho/Skin: Arthritis [ ] ; Joint pain [ ] ; Muscle pain [ ] ; Joint swelling [ ] ; Back Pain [ ] ; Rash [ ]   Psych: Depression[ ] ; Anxiety[ ]   Heme: Bleeding problems [ ] ; Clotting disorders [ ] ; Anemia [Y]  Endocrine: Diabetes [ ] ; Thyroid dysfunction[Y]   Past Medical History:  Diagnosis Date   Anemia    Anxiety    GERD  (gastroesophageal reflux disease)    Hyperlipidemia    Hypertension    IBS (irritable bowel syndrome)    Insomnia    Low back pain    Lung cancer ([place]) 10/2020   TIA (transient ischemic attack) 10/2017   no deficits   Vitamin B12 deficiency    Vitamin D deficiency     Current Outpatient Medications  Medication Sig Dispense Refill   amiodarone (PACERONE) 200 MG tablet Take 200 mg by mouth daily.     carboxymethylcellulose (REFRESH PLUS) 0.5 % SOLN Place 1 drop into both eyes 3 (three) times daily as needed (for dryness).      carisoprodol (SOMA) 350 MG tablet Take 350 mg by mouth 3 (three) times daily.   5   Cholecalciferol (VITAMIN D3) 50 MCG (2000 UT) TABS Take 2,000 Units by mouth daily.     clobetasol ointment (TEMOVATE) 4.19 % Apply 1 application topically See admin instructions. Apply to vaginal area daily as directed     clonazePAM (KLONOPIN) 1 MG tablet Take 1 mg by mouth at bedtime.   3   digoxin (LANOXIN) 0.125 MG tablet Take 1 tablet (0.125 mg total) by mouth daily. 30 tablet 0   docusate sodium (COLACE) 100 MG capsule Take 1 capsule (100 mg total) by mouth 2 (two) times daily. (Patient taking differently: Take 100 mg by mouth 2 (two) times daily as needed for mild constipation or moderate constipation.) 10 capsule 0   ELIQUIS 5 MG TABS tablet TAKE 1 TABLET(5 MG) BY MOUTH TWICE DAILY (Patient taking differently: Take 5 mg by mouth 2 (two) times daily.) 60 tablet 6   escitalopram (LEXAPRO) 10 MG tablet Take 1 tablet (10 mg total) by mouth daily. 30 tablet 2   fluticasone (FLONASE) 50 MCG/ACT nasal spray Place 2 sprays into both nostrils daily as needed for allergies or rhinitis.   5   furosemide (LASIX) 20 MG tablet Take 1 tablet (20 mg total) by mouth daily. Take only if weight goes above 180 lbs. 30 tablet 2   gabapentin (NEURONTIN) 600 MG tablet Take 600 mg by mouth See admin instructions. Take 600 mg by mouth in the morning and at lunchtime     guaiFENesin-dextromethorphan  (ROBITUSSIN DM) 100-10 MG/5ML syrup Take 10 mLs by mouth every 4 (four) hours as needed for cough. 118 mL 0   ipratropium (ATROVENT) 0.02 % nebulizer solution Take by nebulization 3 (three) times daily. As directed     levalbuterol (XOPENEX) 1.25 MG/3ML nebulizer solution Inhale into the lungs as directed.     lidocaine (XYLOCAINE) 2 % solution Use as directed 15 mLs in the mouth or throat every 6 (six) hours as needed for mouth pain. 100 mL 0   linaclotide (LINZESS) 145 MCG CAPS capsule Take 1 capsule (145 mcg total) by mouth daily at 4 PM. 30 capsule 0   magic mouthwash SOLN Take 5 mLs by mouth 4 (four) times daily as needed for mouth pain. Pt is allergic to Magnesium 240 mL 1   magnesium oxide (  MAG-OX) 400 (240 Mg) MG tablet Take 1 tablet (400 mg total) by mouth daily. 30 tablet 1   memantine (NAMENDA) 10 MG tablet Take 1 tablet (10 mg total) by mouth 2 (two) times daily. 60 tablet 4   memantine (NAMENDA) 5 MG tablet Begin this prescription the first day of brain radiation. Week 1: take one tablet po qam. Week 2: take one tablet qam and qpm. Week 3: take two tablets qam, and one tablet po q pm. Week 4: take two tablets qam and qpm. Fill subsequent prescription q month. 70 tablet 0   nystatin (MYCOSTATIN) 100000 UNIT/ML suspension Take 5 mLs (500,000 Units total) by mouth 4 (four) times daily. 440 mL 5   omeprazole (PRILOSEC) 20 MG capsule Take 20 mg by mouth daily before breakfast.      ondansetron (ZOFRAN-ODT) 8 MG disintegrating tablet Take 8 mg by mouth daily as needed for nausea.     Polyethyl Glycol-Propyl Glycol (SYSTANE) 0.4-0.3 % GEL ophthalmic gel Place 1 application into both eyes at bedtime.     polyethylene glycol (MIRALAX / GLYCOLAX) packet Take 34 g by mouth in the morning.     prochlorperazine (COMPAZINE) 10 MG tablet Take 1 tablet (10 mg total) by mouth every 6 (six) hours as needed for nausea or vomiting. 30 tablet 0   rosuvastatin (CRESTOR) 5 MG tablet Take 5 mg by mouth 3 (three)  times a week. Mondays Wednesdays Fridays     traMADol (ULTRAM) 50 MG tablet Take 100 mg by mouth in the morning, at noon, and at bedtime.     zaleplon (SONATA) 10 MG capsule Take 10 mg by mouth at bedtime as needed (for interrupted sleep).      metoprolol tartrate (LOPRESSOR) 50 MG tablet Take 1.5 tablets (75 mg total) by mouth 2 (two) times daily. 270 tablet 0   No current facility-administered medications for this encounter.    Allergies  Allergen Reactions   Augmentin [Amoxicillin-Pot Clavulanate] Nausea Only and Other (See Comments)    GI upset   Levaquin [Levofloxacin In D5w] Other (See Comments)    Joint problems    Magnesium Hydroxide Other (See Comments)    Welts  Other reaction(s): welts   Oxycodone Other (See Comments)    Nausea    Other Itching    Paper tape      Social History   Socioeconomic History   Marital status: Married    Spouse name: Not on file   Number of children: Not on file   Years of education: Not on file   Highest education level: Not on file  Occupational History   Not on file  Tobacco Use   Smoking status: Former    Packs/day: 1.00    Years: 50.00    Pack years: 50.00    Types: Cigarettes    Start date: 20    Quit date: 12/14/2019    Years since quitting: 1.2   Smokeless tobacco: Never  Vaping Use   Vaping Use: Never used  Substance and Sexual Activity   Alcohol use: Not Currently    Comment: occ once every 2 months   Drug use: Never   Sexual activity: Not on file  Other Topics Concern   Not on file  Social History Narrative   Live with husband.  Education HS.  Retired.  Children 2 (daughter). 1 cup coffee.     Social Determinants of Health   Financial Resource Strain: Low Risk    Difficulty of Paying Living  Expenses: Not hard at all  Food Insecurity: No Food Insecurity   Worried About Charity fundraiser in the Last Year: Never true   Ran Out of Food in the Last Year: Never true  Transportation Needs: No Transportation  Needs   Lack of Transportation (Medical): No   Lack of Transportation (Non-Medical): No  Physical Activity: Not on file  Stress: No Stress Concern Present   Feeling of Stress : Only a little  Social Connections: Engineer, building services of Communication with Friends and Family: More than three times a week   Frequency of Social Gatherings with Friends and Family: More than three times a week   Attends Religious Services: More than 4 times per year   Active Member of [employer] or Organizations: Yes   Attends Music therapist: More than 4 times per year   Marital Status: Married  Human resources officer Violence: Not on file      Family History  Problem Relation Age of Onset   Breast cancer Sister    Breast cancer Maternal Aunt    Breast cancer Paternal Aunt     Vitals:   03/06/21 1415  BP: 98/68  Pulse: (!) 52  SpO2: 96%  Weight: 78.5 kg (173 lb)    PHYSICAL EXAM: General:  Sitting in wheelchair. No distress. On 2L O2 [place]. HEENT: normal Neck: supple. no JVD. Carotids 2+ bilat; no bruits.  Cor: PMI nondisplaced. Regular rate & rhythm. No rubs, gallops or murmurs. Lungs: Diminished throughout Abdomen: soft, nontender, nondistended.  Extremities: no cyanosis, clubbing, rash, edema Neuro: alert & oriented x 3, cranial nerves grossly intact. moves all 4 extremities w/o difficulty. Affect pleasant.  ECG: Sinus bradycardia with 1st degree AVB, HR 52 bpm   ASSESSMENT & PLAN: HFmEF: -EF 60-65% on echo 09/22 -Echo 02/23: EF 45-50%, RV mildly enlarged, PASP 40 mmHg, moderate TR, dilated IVC with estimated RA pressure 15 mmHg -Suspect recent CHF decompensation and reduced EF may be due to atrial flutter with RVR. Reports HR 140s at home several weeks prior to admission.  -Currently NYHA III. Confounded by deconditioning and underlying pulmonary disease.  -Volume stable. Uses lasix PRN.  -On metoprolol tartrate 100 mg BID (see below), switch to metoprolol succinate if EF  reduced on subsequent echo -GDMT limited by hypotension. SBP 90s today. -Did not add SGLT2i d/t low volume on exam -Can likely stop digoxin soon if EF improving -CMET and BNP today -Will refer back to General Cardiology for follow-up. Saw Dr. Stanford Breed during both recent hospitalizations. Recommend echo in next few weeks to reassess LV function and EF now that she is maintaining SR. If EF not improving, will need additional workup to r/o other etiologies of CM.  2. Atrial arrhythmias: - Hx SVT in addition to atrial fibrillation. Recent admission with atrial flutter with RVR. Tachycardic several weeks prior to admission. - Sp DCCV on 02/06 and again on 02/13. On po amiodarone, recently tapered down to 200 mg daily. Check TSH, Free T4, CMET  - Decrease metoprolol tartrate to 75 mg BID. BP soft. - Follow-up with EP for rhythm control. - Anticoagulated with Eliquis 5 mg BID  3. Prior tobacco use/COPD/chronic respiratory failure with hypoxia: - On 2L O2 [place] - Follows with pulmonary  4. Small cell lung cancer - S/p chemotherapy with cisplatin + etoposide and radiation - Follows with oncology  5. Pancytopenia - Improving. Completed chemo. - Hgb down to 7.4 and platelets 41K on 02/08/21. Last Hgb up to  10.2, platelets 172, WBC 3.7 on 02/27/21 - CBC today   NYHA III GDMT  Diuretic-PRN lasix BB-decreasing metoprolol tartrate to 75 mg BID Ace/ARB/ARNI-None d/t hypotension MRA-None d/t hypotension SGLT2i-None d/t low volume    Referred to HFSW (PCP, Medications, Transportation, ETOH Abuse, Drug Abuse, Insurance, Financial ): No. She does not feel she needs additional support at home. Husband very involved. Refer to Pharmacy: No. Refer to Home Health: No. Refer to Advanced Heart Failure Clinic: No. Refer to General Cardiology: Yes.  Follow up with General Cardiology in a few weeks. Refer back to EP for management of atrial flutter/fibrillation.

## 2021-03-06 ENCOUNTER — Telehealth (HOSPITAL_COMMUNITY): Payer: Self-pay

## 2021-03-06 ENCOUNTER — Telehealth (HOSPITAL_COMMUNITY): Payer: Self-pay | Admitting: *Deleted

## 2021-03-06 ENCOUNTER — Other Ambulatory Visit: Payer: Self-pay

## 2021-03-06 ENCOUNTER — Ambulatory Visit (HOSPITAL_COMMUNITY)
Admission: RE | Admit: 2021-03-06 | Discharge: 2021-03-06 | Disposition: A | Discharge status: Home / Self Care | Payer: Medicare HMO | Source: Ambulatory Visit | Attending: Physician Assistant | Admitting: Physician Assistant

## 2021-03-06 VITALS — BP 98/68 | HR 52 | Wt 173.0 lb

## 2021-03-06 DIAGNOSIS — I4891 Unspecified atrial fibrillation: Secondary | ICD-10-CM | POA: Insufficient documentation

## 2021-03-06 DIAGNOSIS — Z9981 Dependence on supplemental oxygen: Secondary | ICD-10-CM | POA: Diagnosis not present

## 2021-03-06 DIAGNOSIS — I110 Hypertensive heart disease with heart failure: Secondary | ICD-10-CM | POA: Diagnosis not present

## 2021-03-06 DIAGNOSIS — I4892 Unspecified atrial flutter: Secondary | ICD-10-CM | POA: Diagnosis not present

## 2021-03-06 DIAGNOSIS — J9611 Chronic respiratory failure with hypoxia: Secondary | ICD-10-CM | POA: Diagnosis not present

## 2021-03-06 DIAGNOSIS — C3490 Malignant neoplasm of unspecified part of unspecified bronchus or lung: Secondary | ICD-10-CM

## 2021-03-06 DIAGNOSIS — I5020 Unspecified systolic (congestive) heart failure: Secondary | ICD-10-CM | POA: Diagnosis not present

## 2021-03-06 DIAGNOSIS — Z87891 Personal history of nicotine dependence: Secondary | ICD-10-CM | POA: Insufficient documentation

## 2021-03-06 DIAGNOSIS — D61818 Other pancytopenia: Secondary | ICD-10-CM | POA: Insufficient documentation

## 2021-03-06 DIAGNOSIS — Z7901 Long term (current) use of anticoagulants: Secondary | ICD-10-CM | POA: Diagnosis not present

## 2021-03-06 DIAGNOSIS — I471 Supraventricular tachycardia: Secondary | ICD-10-CM | POA: Insufficient documentation

## 2021-03-06 DIAGNOSIS — J449 Chronic obstructive pulmonary disease, unspecified: Secondary | ICD-10-CM | POA: Diagnosis not present

## 2021-03-06 LAB — CBC
HCT: 33 % — ABNORMAL LOW (ref 36.0–46.0)
Hemoglobin: 11.6 g/dL — ABNORMAL LOW (ref 12.0–15.0)
MCH: 36.7 pg — ABNORMAL HIGH (ref 26.0–34.0)
MCHC: 35.2 g/dL (ref 30.0–36.0)
MCV: 104.4 fL — ABNORMAL HIGH (ref 80.0–100.0)
Platelets: 197 10*3/uL (ref 150–400)
RBC: 3.16 MIL/uL — ABNORMAL LOW (ref 3.87–5.11)
WBC: 4.7 10*3/uL (ref 4.0–10.5)
nRBC: 0 % (ref 0.0–0.2)

## 2021-03-06 LAB — COMPREHENSIVE METABOLIC PANEL
ALT: 17 U/L (ref 0–44)
AST: 22 U/L (ref 15–41)
Albumin: 3.7 g/dL (ref 3.5–5.0)
Alkaline Phosphatase: 65 U/L (ref 38–126)
Anion gap: 11 (ref 5–15)
BUN: 8 mg/dL (ref 8–23)
CO2: 27 mmol/L (ref 22–32)
Calcium: 8.9 mg/dL (ref 8.9–10.3)
Chloride: 97 mmol/L — ABNORMAL LOW (ref 98–111)
Creatinine, Ser: 0.79 mg/dL (ref 0.44–1.00)
GFR, Estimated: >60 mL/min (ref >60)
Glucose, Bld: 106 mg/dL — ABNORMAL HIGH (ref 70–99)
Potassium: 4.4 mmol/L (ref 3.5–5.1)
Sodium: 135 mmol/L (ref 135–145)
Total Bilirubin: 0.4 mg/dL (ref 0.3–1.2)
Total Protein: 6.5 g/dL (ref 6.5–8.1)

## 2021-03-06 LAB — T4, FREE: Free T4: 1.29 ng/dL — ABNORMAL HIGH (ref 0.61–1.12)

## 2021-03-06 LAB — DIGOXIN LEVEL: Digoxin Level: 1.8 ng/mL (ref 0.8–2.0)

## 2021-03-06 LAB — TSH: TSH: 4.261 u[IU]/mL (ref 0.350–4.500)

## 2021-03-06 LAB — BRAIN NATRIURETIC PEPTIDE: B Natriuretic Peptide: 283.4 pg/mL — ABNORMAL HIGH (ref 0.0–100.0)

## 2021-03-06 MED ORDER — METOPROLOL TARTRATE 50 MG PO TABS: 75.0000 mg | ORAL_TABLET | Freq: Two times a day (BID) | ORAL | 0 refills | Status: DC

## 2021-03-06 NOTE — Telephone Encounter (Signed)
Called to confirm Heart & Vascular Transitions of Care appointment at 2pm today 2/27. Patient reminded to bring all medications and pill box organizer with them. Confirmed patient has transportation. Gave directions, instructed to utilize [facility] parking.  Confirmed appointment prior to ending call.   Pricilla Holm, MSN, RN Heart Failure Nurse Navigator [phone]

## 2021-03-06 NOTE — Patient Instructions (Signed)
Decrease Metoprolol to 75 mg Twice daily, we have sent you in a new prescription for 50 mg tablets, please take 1 & 1/2 tabs Twice daily   Labs done today, your results will be available in MyChart, we will contact you for abnormal readings.  Thank you for allowing [place] to provider your heart failure care after your recent hospitalization. Please follow-up with [facility] in a few weeks, they will call you to schedule this appointment  Do the following things EVERYDAY: Weigh yourself in the morning before breakfast. Write it down and keep it in a log. Take your medicines as prescribed Eat low salt foods--Limit salt (sodium) to 2000 mg per day.  Stay as active as you can everyday Limit all fluids for the day to less than 2 liters

## 2021-03-06 NOTE — Telephone Encounter (Signed)
Spoke w/pt, she is aware, agreeable, and verbalized understanding.

## 2021-03-06 NOTE — Telephone Encounter (Signed)
-----   Message from Joette Catching, [place] sent at 03/06/2021  5:27 PM EST ----- CMET stable.TSH okay. Digoxn level significantly elevated at 1.8. She needs to stop digoxin.

## 2021-03-07 DIAGNOSIS — Z298 Encounter for other specified prophylactic measures: Secondary | ICD-10-CM | POA: Diagnosis not present

## 2021-03-07 DIAGNOSIS — D701 Agranulocytosis secondary to cancer chemotherapy: Secondary | ICD-10-CM | POA: Diagnosis not present

## 2021-03-07 DIAGNOSIS — T451X5A Adverse effect of antineoplastic and immunosuppressive drugs, initial encounter: Secondary | ICD-10-CM | POA: Diagnosis not present

## 2021-03-07 DIAGNOSIS — E876 Hypokalemia: Secondary | ICD-10-CM | POA: Diagnosis not present

## 2021-03-07 DIAGNOSIS — Z5111 Encounter for antineoplastic chemotherapy: Secondary | ICD-10-CM | POA: Diagnosis not present

## 2021-03-07 DIAGNOSIS — C771 Secondary and unspecified malignant neoplasm of intrathoracic lymph nodes: Secondary | ICD-10-CM | POA: Diagnosis not present

## 2021-03-07 DIAGNOSIS — Z510 Encounter for antineoplastic radiation therapy: Secondary | ICD-10-CM | POA: Diagnosis not present

## 2021-03-07 DIAGNOSIS — Z5189 Encounter for other specified aftercare: Secondary | ICD-10-CM | POA: Diagnosis not present

## 2021-03-07 DIAGNOSIS — C3432 Malignant neoplasm of lower lobe, left bronchus or lung: Secondary | ICD-10-CM | POA: Diagnosis not present

## 2021-03-08 ENCOUNTER — Other Ambulatory Visit: Payer: Self-pay

## 2021-03-08 ENCOUNTER — Ambulatory Visit
Admission: RE | Admit: 2021-03-08 | Discharge: 2021-03-08 | Disposition: A | Discharge status: Home / Self Care | Payer: Medicare HMO | Source: Ambulatory Visit | Attending: Radiation Oncology | Admitting: Radiation Oncology

## 2021-03-08 DIAGNOSIS — C3432 Malignant neoplasm of lower lobe, left bronchus or lung: Secondary | ICD-10-CM | POA: Diagnosis not present

## 2021-03-08 DIAGNOSIS — C771 Secondary and unspecified malignant neoplasm of intrathoracic lymph nodes: Secondary | ICD-10-CM | POA: Insufficient documentation

## 2021-03-08 DIAGNOSIS — T451X5A Adverse effect of antineoplastic and immunosuppressive drugs, initial encounter: Secondary | ICD-10-CM | POA: Insufficient documentation

## 2021-03-08 DIAGNOSIS — Z510 Encounter for antineoplastic radiation therapy: Secondary | ICD-10-CM | POA: Diagnosis not present

## 2021-03-08 DIAGNOSIS — Z298 Encounter for other specified prophylactic measures: Secondary | ICD-10-CM | POA: Diagnosis not present

## 2021-03-08 DIAGNOSIS — Z5111 Encounter for antineoplastic chemotherapy: Secondary | ICD-10-CM | POA: Diagnosis not present

## 2021-03-08 DIAGNOSIS — E876 Hypokalemia: Secondary | ICD-10-CM | POA: Insufficient documentation

## 2021-03-08 DIAGNOSIS — D701 Agranulocytosis secondary to cancer chemotherapy: Secondary | ICD-10-CM | POA: Insufficient documentation

## 2021-03-08 DIAGNOSIS — Z5189 Encounter for other specified aftercare: Secondary | ICD-10-CM | POA: Diagnosis not present

## 2021-03-09 ENCOUNTER — Ambulatory Visit
Admission: RE | Admit: 2021-03-09 | Discharge: 2021-03-09 | Disposition: A | Discharge status: Home / Self Care | Payer: Medicare HMO | Source: Ambulatory Visit | Attending: Radiation Oncology | Admitting: Radiation Oncology

## 2021-03-09 DIAGNOSIS — E876 Hypokalemia: Secondary | ICD-10-CM | POA: Diagnosis not present

## 2021-03-09 DIAGNOSIS — Z510 Encounter for antineoplastic radiation therapy: Secondary | ICD-10-CM | POA: Diagnosis not present

## 2021-03-09 DIAGNOSIS — C771 Secondary and unspecified malignant neoplasm of intrathoracic lymph nodes: Secondary | ICD-10-CM | POA: Diagnosis not present

## 2021-03-09 DIAGNOSIS — C3432 Malignant neoplasm of lower lobe, left bronchus or lung: Secondary | ICD-10-CM | POA: Diagnosis not present

## 2021-03-09 DIAGNOSIS — T451X5A Adverse effect of antineoplastic and immunosuppressive drugs, initial encounter: Secondary | ICD-10-CM | POA: Diagnosis not present

## 2021-03-09 DIAGNOSIS — Z5111 Encounter for antineoplastic chemotherapy: Secondary | ICD-10-CM | POA: Diagnosis not present

## 2021-03-09 DIAGNOSIS — D701 Agranulocytosis secondary to cancer chemotherapy: Secondary | ICD-10-CM | POA: Diagnosis not present

## 2021-03-09 DIAGNOSIS — Z5189 Encounter for other specified aftercare: Secondary | ICD-10-CM | POA: Diagnosis not present

## 2021-03-10 ENCOUNTER — Other Ambulatory Visit: Payer: Self-pay

## 2021-03-10 ENCOUNTER — Ambulatory Visit
Admission: RE | Admit: 2021-03-10 | Discharge: 2021-03-10 | Disposition: A | Discharge status: Home / Self Care | Payer: Medicare HMO | Source: Ambulatory Visit | Attending: Radiation Oncology | Admitting: Radiation Oncology

## 2021-03-10 DIAGNOSIS — C3432 Malignant neoplasm of lower lobe, left bronchus or lung: Secondary | ICD-10-CM | POA: Diagnosis not present

## 2021-03-10 DIAGNOSIS — Z510 Encounter for antineoplastic radiation therapy: Secondary | ICD-10-CM | POA: Diagnosis not present

## 2021-03-10 DIAGNOSIS — C3492 Malignant neoplasm of unspecified part of left bronchus or lung: Secondary | ICD-10-CM | POA: Diagnosis not present

## 2021-03-10 DIAGNOSIS — T451X5A Adverse effect of antineoplastic and immunosuppressive drugs, initial encounter: Secondary | ICD-10-CM | POA: Diagnosis not present

## 2021-03-10 DIAGNOSIS — D630 Anemia in neoplastic disease: Secondary | ICD-10-CM | POA: Diagnosis not present

## 2021-03-10 DIAGNOSIS — J9621 Acute and chronic respiratory failure with hypoxia: Secondary | ICD-10-CM | POA: Diagnosis not present

## 2021-03-10 DIAGNOSIS — D61810 Antineoplastic chemotherapy induced pancytopenia: Secondary | ICD-10-CM | POA: Diagnosis not present

## 2021-03-10 DIAGNOSIS — E876 Hypokalemia: Secondary | ICD-10-CM | POA: Diagnosis not present

## 2021-03-10 DIAGNOSIS — D701 Agranulocytosis secondary to cancer chemotherapy: Secondary | ICD-10-CM | POA: Diagnosis not present

## 2021-03-10 DIAGNOSIS — F419 Anxiety disorder, unspecified: Secondary | ICD-10-CM | POA: Diagnosis not present

## 2021-03-10 DIAGNOSIS — I480 Paroxysmal atrial fibrillation: Secondary | ICD-10-CM | POA: Diagnosis not present

## 2021-03-10 DIAGNOSIS — I110 Hypertensive heart disease with heart failure: Secondary | ICD-10-CM | POA: Diagnosis not present

## 2021-03-10 DIAGNOSIS — Z5189 Encounter for other specified aftercare: Secondary | ICD-10-CM | POA: Diagnosis not present

## 2021-03-10 DIAGNOSIS — I5033 Acute on chronic diastolic (congestive) heart failure: Secondary | ICD-10-CM | POA: Diagnosis not present

## 2021-03-10 DIAGNOSIS — J439 Emphysema, unspecified: Secondary | ICD-10-CM | POA: Diagnosis not present

## 2021-03-10 DIAGNOSIS — C771 Secondary and unspecified malignant neoplasm of intrathoracic lymph nodes: Secondary | ICD-10-CM | POA: Diagnosis not present

## 2021-03-10 DIAGNOSIS — Z5111 Encounter for antineoplastic chemotherapy: Secondary | ICD-10-CM | POA: Diagnosis not present

## 2021-03-12 NOTE — Progress Notes (Unsigned)
Cardiology Office Note:    Date:  03/18/2021   ID:  Patricia [name], DOB [birth year], MRN [id number]  PCP:  [name], MD  Cardiologist:  Kirk Ruths, MD  Electrophysiologist:  Constance Haw, MD   Referring MD: [name], MD   Chief Complaint: hospital follow-up of CHF and atrial flutter  History of Present Illness:    Patricia [name] is a 71 y.o. 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 Stanford Breed and [facility] and presents today for hospital follow-up of CHF and atrial flutter.  Patient was referred to Dr. Curt Bears in 06/2018 for evaluation of SVT.  Monitor around that time showed SVT but no atrial fibrillation.  However repeat monitor in 12/2019 both SVT and atrial fibrillation.  She was started on Eliquis.  Has had multiple admissions over the last several months.  Who had multiple ED visits in November/December 2022 for atrial fibrillation/flutter.  Flecainide was started. She was admitted in 12/2020 with acute hypoxic respiratory failure in the setting of COPD exacerbation and atrial fibrillation with RVR.  Eliquis had to be held due to worsening pancytopenia; therefore, Flecainide was stopped as well.  She was started on Cardizem and Metoprolol instead. Eliquis was ultimately able to be restarted prior to discharge per oncology.    She was readmitted in 02/2021 current atrial flutter with RVR and acute on chronic hypoxic respiratory failure secondary to acute diastolic CHF after presenting with acute severe dyspnea woke her up from sleep.  Patient underwent DCCV on 02/13/2021 but unfortunately went back into atrial flutter after this.  Therefore, she was  started on Amiodarone and Digoxin and then underwent repeat DCCV on 02/20/2021.  She was also diuresed with IV Lasix.  Echo during admission showed LVEF of 45-50% with global hypokinesis and mildly enlarged RV with mild late elevated PASP of 40.2 mmHg as well as a moderate pleural effusion on the left. EF down from 60-65% from 09/2020 (felt to be due to rapid atrial flutter). She was discharged on an Amiodarone taper, Digoxin, and PO Lasix which she was instructed to take only if her weight got above 180 pounds.  She was seen in the [facility] CHF Clinic follow-up on 03/06/2021 at which time she was doing well since discharge.  Her weight was stable 171 pounds.  Her dyspnea on exertion is stable she had no significant edema.  Her BP was soft in the office with systolic BP in the 44I but patient was asymptomatic with this.  She was maintaining sinus rhythm with heart rates in the 50s.  Her Lopressor was decreased to 75 mg twice daily due to soft BP. Digoxin level came back as 1.8 so this was stopped. 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  Called our office on 03/13/2021 with reports that heart rate was in the 110s to 120s after decrease of Lopressor.  BP was still soft at times limiting up-titration of beta-blocker. Therefore, visit with EP was arranged. He was seen by Ambrose Pancoast, NP, on 03/17/2021 at which time she was noted to be in 2:1 atrial flutter with a rate of 109 bpm but was asymptomatic and was otherwise  doing well from a cardiac standpoint. She was advised to continue current medications (Amiodarone 200mg  daily and Lopressor 75mg  twice daily) and call if heart rates sustaining above 115 bpm.   Presents today for follow-up. ***  Persistent Atrial Fibrillation/Flutter Paroxysmal SVT Patient was recently admitted with rapid atrial flutter.  She underwent DCCV but had recurrence was then started on Amiodarone and Digoxin with repeat DCCV.   Digoxin was ultimately stopped at initial follow-up visit due to elevated Digoxin level of 1.8. She was then noted to be in recurrent 2:1 atrial flutter at visit on 03/17/2021. - *** - Per recent visit with EP, plan was to continue current medications: Amiodarone 200 mg daily and Lopressor 75 mg twice daily. Up-titration of Lopressor is limited by soft BP at times. She was advised to contact EP if heart rates sustaining >115 bpm as recommended at last visit with them. - Continue chronic anticoagulation with Eliquis 5mg  twice daily. - Of note, free T4 was mildly elevated on labs on 03/06/2021 but TSH was normal.  We will repeat TSH and free T4 today. *** - Follow-up with Dr. Curt Bears next month as scheduled.  Chronic CHF with Mildly Reduced EF Patient was recently admitted with acute CHF and rapid atrial flutter. Echo showed LVEF of 45-50% with global hypokinesis and mildly enlarged RV with mild late elevated PASP of 40.2 mmHg as well as a moderate pleural effusion on the left. EF was down from 60-65% in 09/2020 and this was felt to likely be due to tachyarrhythmia.  - Euvolemic on exam.  - Continue Lasix 20mg  daily as needed if weight >180 lbs. - GDMIT limited by hypotension. Only on Lopressor 75mg  twice daily right now. *** - Could consider adding SGLT2 inhibitor *** (not adding at CHF visit due to low volume on exam) *** - Discussed importance of daily weights and sodium/fluid restrictions. - Will ultimately need repeat Echo when sinus rhythm is restored to reassess LV function. If EF still down, may need to consider ischemic evaluation.  Hypertension BP has been soft lately. *** - Continue Lopressor 75mg  twice daily.  Hyperlipidemia Last lipid panel ***: - Continue Crestor 5mg  M/W/F. - Managed by PCP.  Small Cell Lung Cancer Chronic Respiratory Failure Pancytopenia Patient has small cell lung cancer s/p chemotherapy. *** Currently receiving radiation. She is on 2L of O2 at home. She has  had pancytopenia secondary to chemo.  - WBC, Hgb, Plts stable at last check on 03/06/2021. - Followed by Oncology.   Past Medical History:  Diagnosis Date   Anemia    Anxiety    GERD (gastroesophageal reflux disease)    Hyperlipidemia    Hypertension    IBS (irritable bowel syndrome)    Insomnia    Low back pain    Lung cancer ([place]) 10/2020   TIA (transient ischemic attack) 10/2017   no deficits   Vitamin B12 deficiency    Vitamin D deficiency     Past Surgical History:  Procedure Laterality Date   BACK SURGERY     BREAST EXCISIONAL BIOPSY Left 1997   benign   BRONCHIAL BIOPSY  10/18/2020   Procedure: BRONCHIAL BIOPSIES;  Surgeon: [name], DO;  Location: [place] [facility];  Service: Pulmonary;;   BRONCHIAL BRUSHINGS  10/18/2020   Procedure: BRONCHIAL BRUSHINGS;  Surgeon: [name], DO;  Location: [place] [facility];  Service: Pulmonary;;   BRONCHIAL NEEDLE ASPIRATION BIOPSY  10/18/2020   Procedure: BRONCHIAL NEEDLE ASPIRATION BIOPSIES;  Surgeon: [name], DO;  Location: [place] [facility];  Service: Pulmonary;;   BRONCHIAL WASHINGS  10/18/2020   Procedure: BRONCHIAL WASHINGS;  Surgeon: [name], DO;  Location: [place];  Service: Pulmonary;;   CARDIOVERSION N/A 02/13/2021   Procedure: CARDIOVERSION;  Surgeon: Werner Lean, MD;  Location: [place];  Service: Cardiovascular;  Laterality: N/A;   CARDIOVERSION N/A 02/20/2021   Procedure: CARDIOVERSION;  Surgeon: Skeet Latch, MD;  Location: [place];  Service: Cardiovascular;  Laterality: N/A;   hemorrhoidecotmy     HERNIA MESH REMOVAL     OPEN REDUCTION INTERNAL FIXATION (ORIF) DISTAL RADIAL FRACTURE Right 11/19/2019   Procedure: OPEN REDUCTION INTERNAL FIXATION (ORIF) DISTAL RADIUS AND ULNA FRACTURE WITH REPAIR AS NECESSARY;  Surgeon: Roseanne Kaufman, MD;  Location: [place];  Service: Orthopedics;  Laterality: Right;  2 hrs Block with IV sedation   ORIF RADIUS & ULNA FRACTURES      TONSILLECTOMY     VIDEO BRONCHOSCOPY WITH ENDOBRONCHIAL NAVIGATION Left 10/18/2020   Procedure: VIDEO BRONCHOSCOPY WITH ENDOBRONCHIAL NAVIGATION;  Surgeon: [name], DO;  Location: [place];  Service: Pulmonary;  Laterality: Left;  ION   VIDEO BRONCHOSCOPY WITH ENDOBRONCHIAL ULTRASOUND Bilateral 10/18/2020   Procedure: VIDEO BRONCHOSCOPY WITH ENDOBRONCHIAL ULTRASOUND;  Surgeon: [name], DO;  Location: [place];  Service: Pulmonary;  Laterality: Bilateral;   VIDEO BRONCHOSCOPY WITH RADIAL ENDOBRONCHIAL ULTRASOUND  10/18/2020   Procedure: VIDEO BRONCHOSCOPY WITH RADIAL ENDOBRONCHIAL ULTRASOUND;  Surgeon: [name], DO;  Location: [place] [facility];  Service: Pulmonary;;    Current Medications: No outpatient medications have been marked as taking for the 03/21/21 encounter (Appointment) with Darreld Mclean, PA-C.     Allergies:   Amoxicillin-pot clavulanate, Levaquin [levofloxacin in d5w], Magnesium hydroxide, Oxycodone, and Other   Social History   Socioeconomic History   Marital status: Married    Spouse name: Not on file   Number of children: Not on file   Years of education: Not on file   Highest education level: Not on file  Occupational History   Not on file  Tobacco Use   Smoking status: Former    Packs/day: 1.00    Years: 50.00    Pack years: 50.00    Types: Cigarettes    Start date: 84    Quit date: 12/14/2019    Years since quitting: 1.2   Smokeless tobacco: Never  Vaping Use   Vaping Use: Never used  Substance and Sexual Activity   Alcohol use: Not Currently    Comment: occ once every 2 months   Drug use: Never   Sexual activity: Not on file  Other Topics Concern   Not on file  Social History Narrative   Live with husband.  Education HS.  Retired.  Children 2 (daughter). 1 cup coffee.     Social Determinants of Health   Financial Resource Strain: Low Risk    Difficulty of Paying Living Expenses: Not hard at all  Food  Insecurity: No Food Insecurity   Worried About Charity fundraiser in the Last Year: Never true   [place] in the Last Year: Never true  Transportation Needs: No Transportation Needs   Lack of Transportation (Medical): No   Lack of Transportation (Non-Medical): No  Physical Activity: Not on file  Stress: No Stress Concern Present   Feeling of Stress : Only a little  Social Connections: Engineer, building services of Communication with Friends and Family: More than three times a week   Frequency of  Social Gatherings with Friends and Family: More than three times a week   Attends Religious Services: More than 4 times per year   Active Member of [employer] or Organizations: Yes   Attends Music therapist: More than 4 times per year   Marital Status: Married     Family History: The patient's family history includes Breast cancer in her maternal aunt, paternal aunt, and sister.  ROS:   Please see the history of present illness.     EKGs/Labs/Other Studies Reviewed:    The following studies were reviewed today:  Carotid Ultrasound 10/01/2020: Summary: - Right Carotid: Velocities in the right ICA are consistent with a 1-39%  stenosis.  - Left Carotid: Velocities in the left ICA are consistent with a 1-39%  stenosis.  - Vertebrals: Bilateral vertebral arteries demonstrate antegrade flow.  - Subclavians: Normal flow hemodynamics were seen in bilateral subclavian arteries.  _______________  Echocardiogram 02/13/2021: Impressions:  1. Left ventricular ejection fraction, by estimation, is 45 to 50%. The  left ventricle has mildly decreased function. The left ventricle  demonstrates global hypokinesis.   2. The right ventricular size is mildly enlarged. There is mildly  elevated pulmonary artery systolic pressure. The estimated right  ventricular systolic pressure is 36.1 mmHg.   3. Right atrial size was mildly dilated.   4. Moderate pleural effusion in the left  lateral region.   5. Tricuspid valve regurgitation is moderate.   6. The inferior vena cava is dilated in size with <50% respiratory  variability, suggesting right atrial pressure of 15 mmHg.   Comparison(s): Prior images reviewed side by side. The left ventricular  function is worsened.  EKG:  EKG ordered today. EKG personally reviewed and demonstrates ***.  Recent Labs: 02/20/2021: Magnesium 1.9 03/06/2021: ALT 17; B Natriuretic Peptide 283.4; Hemoglobin 11.6; Platelets 197; TSH 4.261 03/17/2021: BUN 13; Creatinine, Ser 0.76; Potassium 4.7; Sodium 133  Recent Lipid Panel No results found for: CHOL, TRIG, HDL, CHOLHDL, VLDL, LDLCALC, LDLDIRECT  Physical Exam:    Vital Signs: LMP  (LMP Unknown)     Wt Readings from Last 3 Encounters:  03/17/21 172 lb 12.8 oz (78.4 kg)  03/06/21 173 lb (78.5 kg)  02/28/21 176 lb 5 oz (80 kg)     General: 71 y.o. female in no acute distress. HEENT: Normocephalic and atraumatic. Sclera clear. EOMs intact. Neck: Supple. No carotid bruits. No JVD. Heart: *** RRR. Distinct S1 and S2. No murmurs, gallops, or rubs. Radial and distal pedal pulses 2+ and equal bilaterally. Lungs: No increased work of breathing. Clear to ausculation bilaterally. No wheezes, rhonchi, or rales.  Abdomen: Soft, non-distended, and non-tender to palpation. Bowel sounds present in all 4 quadrants.  MSK: Normal strength and tone for age. *** Extremities: No lower extremity edema.    Skin: Warm and dry. Neuro: Alert and oriented x3. No focal deficits. Psych: Normal affect. Responds appropriately.   Assessment:    No diagnosis found.  Plan:     Disposition: Follow up in ***   Medication Adjustments/Labs and Tests Ordered: Current medicines are reviewed at length with the patient today.  Concerns regarding medicines are outlined above.  No orders of the defined types were placed in this encounter.  No orders of the defined types were placed in this encounter.   There  are no Patient Instructions on file for this visit.   Signed, Darreld Mclean, PA-C  03/18/2021 10:51 AM    [place]

## 2021-03-12 NOTE — H&P (View-Only) (Signed)
?Cardiology Office Note:   ? ?Date:  03/21/2021  ? ?ID:  Cristina Ceniceros, DOB [birth year], MRN [id number] ? ?PCP:  [name], MD  ?Cardiologist:  Kirk Ruths, MD  ?Electrophysiologist:  Will Meredith Leeds, MD  ? ?Referring MD: [name], MD  ? ?Chief Complaint: hospital follow-up of CHF and atrial flutter ? ?History of Present Illness:   ? ?Patricia [name] is a 71 y.o. 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 Stanford Breed and [facility] and presents today for hospital follow-up of CHF and atrial flutter. ? ?Patient was referred to Dr. Curt Bears in 06/2018 for evaluation of SVT.  Monitor around that time showed SVT but no atrial fibrillation.  However repeat monitor in 12/2019 both SVT and atrial fibrillation.  She was started on Eliquis.  Has had multiple admissions over the last several months.  Who had multiple ED visits in November/December 2022 for atrial fibrillation/flutter.  Flecainide was started. She was admitted in 12/2020 with acute hypoxic respiratory failure in the setting of COPD exacerbation and atrial fibrillation with RVR.  Eliquis had to be held due to worsening pancytopenia; therefore, Flecainide was stopped as well.  She was started on Cardizem and Metoprolol instead. Eliquis was ultimately able to be restarted prior to discharge per oncology.   ? ?She was readmitted in 02/2021 current atrial flutter with RVR and acute on chronic hypoxic respiratory failure secondary to acute diastolic CHF after presenting with acute severe dyspnea woke her up from sleep.  She was started on IV Amiodarone and then underwent DCCV on 02/13/2021 but unfortunately went back into atrial  flutter after this.  Therefore, Amiodarone load was continued and she was started on Digoxin and then underwent repeat DCCV on 02/20/2021 with restoration of sinus rhythm.  She was also diuresed with IV Lasix.  Echo during admission showed LVEF of 45-50% with global hypokinesis and mildly enlarged RV with mild late elevated PASP of 40.2 mmHg as well as a moderate pleural effusion on the left. EF down from 60-65% from 09/2020 (felt to be due to rapid atrial flutter). She was discharged on an Amiodarone taper, Digoxin, and PO Lasix which she was instructed to take only if her weight got above 180 pounds. ? ?She was seen in the [facility]F Clinic follow-up on 03/06/2021 at which time she was doing well since discharge.  Her weight was stable 171 pounds.  Her dyspnea on exertion is stable she had no significant edema.  Her BP was soft in the office with systolic BP in the 49F but patient was asymptomatic with this.  She was maintaining sinus rhythm with heart rates in the 50s.  Her Lopressor was decreased to 75 mg twice daily due to soft BP. Digoxin level came back as 1.8 so this was stopped. 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 ? ?Patient called our office on 03/13/2021 with reports that heart rate was in the 110s to 120s after decrease of Lopressor.  BP was still soft at times limiting up-titration of beta-blocker. Therefore, visit with EP was arranged. He was seen by Ambrose Pancoast, NP, on 03/17/2021 at which time she was noted to be  in 2:1 atrial flutter with a rate of 109 bpm but was asymptomatic and was otherwise doing well from a cardiac standpoint. She was advised to continue current medications (Amiodarone 200mg  daily and Lopressor 75mg  twice daily) and call if heart rates sustaining above 115 bpm.  ? ?She presents today for follow-up.  Here with husband.  Patient initially did well after discharge but has started to have a decline over the last week.   She reports recurrent weakness and fatigue as well as lightheadedness and dizziness.  She remains in atrial flutter today with rates in the 110s and her BP is soft with systolic BP in the high 62G.  She does report that she fell the other day after bending over to get something out of the fridge and fell on her back.  She thinks she just lost her balance.  She denies hitting her head and denies any syncope. She has no palpitations with her atrial flutter.  No chest pain, new or worsening shortness of breath, orthopnea, PND, lower extremity edema. Patient has decreased PO due to nausea and poor appetite but husband tries to push her to eat. ? ?Past Medical History:  ?Diagnosis Date  ? Anemia   ? Anxiety   ? GERD (gastroesophageal reflux disease)   ? Hyperlipidemia   ? Hypertension   ? IBS (irritable bowel syndrome)   ? Insomnia   ? Low back pain   ? Lung cancer ([place]) 10/2020  ? TIA (transient ischemic attack) 10/2017  ? no deficits  ? Vitamin B12 deficiency   ? Vitamin D deficiency   ? ? ?Past Surgical History:  ?Procedure Laterality Date  ? BACK SURGERY    ? BREAST EXCISIONAL BIOPSY Left 1997  ? benign  ? BRONCHIAL BIOPSY  10/18/2020  ? Procedure: BRONCHIAL BIOPSIES;  Surgeon: [name], DO;  Location: [place] [facility];  Service: Pulmonary;;  ? BRONCHIAL BRUSHINGS  10/18/2020  ? Procedure: BRONCHIAL BRUSHINGS;  Surgeon: [name], DO;  Location: [place] [facility];  Service: Pulmonary;;  ? BRONCHIAL NEEDLE ASPIRATION BIOPSY  10/18/2020  ? Procedure: BRONCHIAL NEEDLE ASPIRATION BIOPSIES;  Surgeon: [name], DO;  Location: [place];  Service: Pulmonary;;  ? BRONCHIAL WASHINGS  10/18/2020  ? Procedure: BRONCHIAL WASHINGS;  Surgeon: [name], DO;  Location: [place];  Service: Pulmonary;;  ? CARDIOVERSION N/A 02/13/2021  ? Procedure: CARDIOVERSION;  Surgeon: Werner Lean, MD;  Location: [place];  Service: Cardiovascular;  Laterality: N/A;  ? CARDIOVERSION N/A 02/20/2021  ?  Procedure: CARDIOVERSION;  Surgeon: Skeet Latch, MD;  Location: [place];  Service: Cardiovascular;  Laterality: N/A;  ? hemorrhoidecotmy    ? HERNIA MESH REMOVAL    ? OPEN REDUCTION INTERNAL FIXATION (ORIF) DISTAL RADIAL FRACTURE Right 11/19/2019  ? Procedure: OPEN REDUCTION INTERNAL FIXATION (ORIF) DISTAL RADIUS AND ULNA FRACTURE WITH REPAIR AS NECESSARY;  Surgeon: Roseanne Kaufman, MD;  Location: [place];  Service: Orthopedics;  Laterality: Right;  2 hrs ?Block with IV sedation  ? ORIF RADIUS & ULNA FRACTURES    ? TONSILLECTOMY    ? VIDEO BRONCHOSCOPY WITH ENDOBRONCHIAL NAVIGATION Left 10/18/2020  ? Procedure: VIDEO BRONCHOSCOPY WITH ENDOBRONCHIAL NAVIGATION;  Surgeon: [name], DO;  Location: [place];  Service: Pulmonary;  Laterality: Left;  ION  ? VIDEO BRONCHOSCOPY WITH ENDOBRONCHIAL ULTRASOUND Bilateral 10/18/2020  ? Procedure: VIDEO BRONCHOSCOPY WITH ENDOBRONCHIAL ULTRASOUND;  Surgeon: [name], DO;  Location: [place];  Service: Pulmonary;  Laterality: Bilateral;  ? VIDEO BRONCHOSCOPY WITH RADIAL  ENDOBRONCHIAL ULTRASOUND  10/18/2020  ? Procedure: VIDEO BRONCHOSCOPY WITH RADIAL ENDOBRONCHIAL ULTRASOUND;  Surgeon: [name], DO;  Location: [place] [facility];  Service: Pulmonary;;  ? ? ?Current Medications: ?Current Meds  ?Medication Sig  ? amiodarone (PACERONE) 200 MG tablet Take 1 tablet (200 mg total) by mouth 2 (two) times daily.  ? carboxymethylcellulose (REFRESH PLUS) 0.5 % SOLN Place 1 drop into both eyes 3 (three) times daily as needed (for dryness).   ? carisoprodol (SOMA) 350 MG tablet Take 350 mg by mouth 3 (three) times daily.   ? clobetasol ointment (TEMOVATE) 5.45 % Apply 1 application. topically daily as needed (vaginal irritation).  ? clonazePAM (KLONOPIN) 1 MG tablet Take 1 mg by mouth at bedtime.   ? docusate sodium (COLACE) 100 MG capsule Take 1 capsule (100 mg total) by mouth 2 (two) times daily. (Patient taking differently: Take 100 mg by mouth 2 (two)  times daily as needed for moderate constipation.)  ? ELIQUIS 5 MG TABS tablet TAKE 1 TABLET(5 MG) BY MOUTH TWICE DAILY  ? escitalopram (LEXAPRO) 10 MG tablet Take 1 tablet (10 mg total) by mouth daily.  ? fluticasone

## 2021-03-13 ENCOUNTER — Telehealth (HOSPITAL_COMMUNITY): Payer: Self-pay | Admitting: *Deleted

## 2021-03-13 ENCOUNTER — Other Ambulatory Visit: Payer: Self-pay

## 2021-03-13 ENCOUNTER — Telehealth: Payer: Self-pay | Admitting: Cardiology

## 2021-03-13 ENCOUNTER — Ambulatory Visit
Admission: RE | Admit: 2021-03-13 | Discharge: 2021-03-13 | Disposition: A | Discharge status: Home / Self Care | Payer: Medicare HMO | Source: Ambulatory Visit | Attending: Radiation Oncology | Admitting: Radiation Oncology

## 2021-03-13 DIAGNOSIS — C3432 Malignant neoplasm of lower lobe, left bronchus or lung: Secondary | ICD-10-CM | POA: Diagnosis not present

## 2021-03-13 DIAGNOSIS — Z5111 Encounter for antineoplastic chemotherapy: Secondary | ICD-10-CM | POA: Diagnosis not present

## 2021-03-13 DIAGNOSIS — Z5189 Encounter for other specified aftercare: Secondary | ICD-10-CM | POA: Diagnosis not present

## 2021-03-13 DIAGNOSIS — C771 Secondary and unspecified malignant neoplasm of intrathoracic lymph nodes: Secondary | ICD-10-CM | POA: Diagnosis not present

## 2021-03-13 DIAGNOSIS — Z510 Encounter for antineoplastic radiation therapy: Secondary | ICD-10-CM | POA: Diagnosis not present

## 2021-03-13 DIAGNOSIS — E876 Hypokalemia: Secondary | ICD-10-CM | POA: Diagnosis not present

## 2021-03-13 DIAGNOSIS — D701 Agranulocytosis secondary to cancer chemotherapy: Secondary | ICD-10-CM | POA: Diagnosis not present

## 2021-03-13 DIAGNOSIS — T451X5A Adverse effect of antineoplastic and immunosuppressive drugs, initial encounter: Secondary | ICD-10-CM | POA: Diagnosis not present

## 2021-03-13 NOTE — Telephone Encounter (Signed)
Pt called stating her blood pressure is better since med changes in [facility] T[facility] clinic but her heart rate is elevated. Today bp 118/91 and heart rate 117. Pt is not followed by the heart failure clinic. She was seen once in the heart and vascular TOC clinic and referred to [facility] with general cardiology. Per Adline Potter pt needs to contact Dr.Crenshaw's office to schedule an appointment. Pt aware and agreeable with plan.  ?

## 2021-03-13 NOTE — Telephone Encounter (Signed)
Pt c/o BP issue: STAT if pt c/o blurred vision, one-sided weakness or slurred speech ? ?1. What are your last 5 BP readings? This morning, BP 118/91 HR 117 ? ?2. Are you having any other symptoms (ex. Dizziness, headache, blurred vision, passed out)? Lost balance yesterday - denies passing out and any other symptoms  ? ?3. What is your BP issue? Patient states that her BP and HR are elevated. She saw the PA at [facility] last week and was taken off of Digoxin, her Metoprolol was reduced from 100mg  2x daily to 75mg  2x daily due to having BP. Now she is concerned because her BP is up and so is her HR. She denies any symptoms this morning but states that she did have an episode yesterday where she felt like she lost her balance. She has an appt with Sande Rives, PA on 03/21/21 but wants to know if she needs to be seen sooner.  ? ?

## 2021-03-13 NOTE — Telephone Encounter (Signed)
Returned call to pt she states that her HR is "creeping up" and she just wanted to let us know. She is not having any sx now but she states that she had  an "episode" yesterday-she lost her balance. Informed pt that I do not think that this was caused by her HR but will forward this to [facility] for her review. She will continue to track BP, I informed her not to take BP more then TID and if she needs to take it again please wait an hour while sitting for 5 min and 1 hour after the first time. She verbalizes understanding. ?

## 2021-03-14 ENCOUNTER — Ambulatory Visit
Admission: RE | Admit: 2021-03-14 | Discharge: 2021-03-14 | Disposition: A | Discharge status: Home / Self Care | Payer: Medicare HMO | Source: Ambulatory Visit | Attending: Radiation Oncology | Admitting: Radiation Oncology

## 2021-03-14 DIAGNOSIS — C3432 Malignant neoplasm of lower lobe, left bronchus or lung: Secondary | ICD-10-CM | POA: Diagnosis not present

## 2021-03-14 DIAGNOSIS — D701 Agranulocytosis secondary to cancer chemotherapy: Secondary | ICD-10-CM | POA: Diagnosis not present

## 2021-03-14 DIAGNOSIS — C771 Secondary and unspecified malignant neoplasm of intrathoracic lymph nodes: Secondary | ICD-10-CM | POA: Diagnosis not present

## 2021-03-14 DIAGNOSIS — Z510 Encounter for antineoplastic radiation therapy: Secondary | ICD-10-CM | POA: Diagnosis not present

## 2021-03-14 DIAGNOSIS — T451X5A Adverse effect of antineoplastic and immunosuppressive drugs, initial encounter: Secondary | ICD-10-CM | POA: Diagnosis not present

## 2021-03-14 DIAGNOSIS — E876 Hypokalemia: Secondary | ICD-10-CM | POA: Diagnosis not present

## 2021-03-14 DIAGNOSIS — Z5111 Encounter for antineoplastic chemotherapy: Secondary | ICD-10-CM | POA: Diagnosis not present

## 2021-03-14 DIAGNOSIS — Z5189 Encounter for other specified aftercare: Secondary | ICD-10-CM | POA: Diagnosis not present

## 2021-03-14 NOTE — Telephone Encounter (Signed)
I bet she is back in atrial fibrillation given heart rates were in the 50s when she was in sinus rhythm. But she is asymptomatic right? Did you mean Lopressor? (I don't see that she is on Losartan). If she has had systolic BP readings in the 80s, then I don't think we should increase it. ? ?Yes, I think we should get her into to see EP or the A.Fib clinic sooner. Looks like Digoxin was stopped due at her [facility] CHF follow-up visit given elevated level of 1.8. I have never seen this patient and am scheduled to see her for the first time next week so I think we should check with Dr. Curt Bears. Dr. Curt Bears, do you have any other recommendations at this time?  ? ?Thank you! ?

## 2021-03-14 NOTE — Telephone Encounter (Signed)
Let's increase her Lopressor back up to 100mg  twice daily. Patient should continue to monitor BP with this and let us know if systolic BP consistently <660 or she is having symptoms of hypotension (lightheadedness, dizziness, etc.). She ultimately needs to get back in to see EP. It doesn't look like she has anything scheduled. Can we get her a follow-up visit with either Dr. Curt Bears or EP APP? ? ?Thank you! ?

## 2021-03-14 NOTE — Telephone Encounter (Signed)
Called pt and notified that this message was forwarded to Dr Curt Bears and she will probably be following up with his office early. Verbalized understanding ?

## 2021-03-14 NOTE — Telephone Encounter (Signed)
Returned call to pt her heart-rate is running high but BP is low. Last night BP 89/61 HR 120. This morning BP 102/69 HR 116. She denies any sx. She states that she is not in AFIB. She is having Tachycardia. She  ?

## 2021-03-15 ENCOUNTER — Other Ambulatory Visit: Payer: Self-pay

## 2021-03-15 ENCOUNTER — Ambulatory Visit
Admission: RE | Admit: 2021-03-15 | Discharge: 2021-03-15 | Disposition: A | Discharge status: Home / Self Care | Payer: Medicare HMO | Source: Ambulatory Visit | Attending: Radiation Oncology | Admitting: Radiation Oncology

## 2021-03-15 DIAGNOSIS — E876 Hypokalemia: Secondary | ICD-10-CM | POA: Diagnosis not present

## 2021-03-15 DIAGNOSIS — Z5111 Encounter for antineoplastic chemotherapy: Secondary | ICD-10-CM | POA: Diagnosis not present

## 2021-03-15 DIAGNOSIS — Z5189 Encounter for other specified aftercare: Secondary | ICD-10-CM | POA: Diagnosis not present

## 2021-03-15 DIAGNOSIS — Z510 Encounter for antineoplastic radiation therapy: Secondary | ICD-10-CM | POA: Diagnosis not present

## 2021-03-15 DIAGNOSIS — C771 Secondary and unspecified malignant neoplasm of intrathoracic lymph nodes: Secondary | ICD-10-CM | POA: Diagnosis not present

## 2021-03-15 DIAGNOSIS — C3432 Malignant neoplasm of lower lobe, left bronchus or lung: Secondary | ICD-10-CM | POA: Diagnosis not present

## 2021-03-15 DIAGNOSIS — D701 Agranulocytosis secondary to cancer chemotherapy: Secondary | ICD-10-CM | POA: Diagnosis not present

## 2021-03-15 DIAGNOSIS — Z298 Encounter for other specified prophylactic measures: Secondary | ICD-10-CM | POA: Diagnosis not present

## 2021-03-15 DIAGNOSIS — T451X5A Adverse effect of antineoplastic and immunosuppressive drugs, initial encounter: Secondary | ICD-10-CM | POA: Diagnosis not present

## 2021-03-16 ENCOUNTER — Ambulatory Visit
Admission: RE | Admit: 2021-03-16 | Discharge: 2021-03-16 | Disposition: A | Discharge status: Home / Self Care | Payer: Medicare HMO | Source: Ambulatory Visit | Attending: Radiation Oncology | Admitting: Radiation Oncology

## 2021-03-16 DIAGNOSIS — Z5189 Encounter for other specified aftercare: Secondary | ICD-10-CM | POA: Diagnosis not present

## 2021-03-16 DIAGNOSIS — E876 Hypokalemia: Secondary | ICD-10-CM | POA: Diagnosis not present

## 2021-03-16 DIAGNOSIS — C3432 Malignant neoplasm of lower lobe, left bronchus or lung: Secondary | ICD-10-CM | POA: Diagnosis not present

## 2021-03-16 DIAGNOSIS — Z5111 Encounter for antineoplastic chemotherapy: Secondary | ICD-10-CM | POA: Diagnosis not present

## 2021-03-16 DIAGNOSIS — C771 Secondary and unspecified malignant neoplasm of intrathoracic lymph nodes: Secondary | ICD-10-CM | POA: Diagnosis not present

## 2021-03-16 DIAGNOSIS — Z510 Encounter for antineoplastic radiation therapy: Secondary | ICD-10-CM | POA: Diagnosis not present

## 2021-03-16 DIAGNOSIS — D701 Agranulocytosis secondary to cancer chemotherapy: Secondary | ICD-10-CM | POA: Diagnosis not present

## 2021-03-16 DIAGNOSIS — T451X5A Adverse effect of antineoplastic and immunosuppressive drugs, initial encounter: Secondary | ICD-10-CM | POA: Diagnosis not present

## 2021-03-16 NOTE — Progress Notes (Signed)
Office Visit    Patient Name: Patricia [name] Date of Encounter: 03/17/2021  Primary Care Provider:  [name], MD Primary Cardiologist:  Will Meredith Leeds, MD  Chief Complaint    Post hospital follow-up   History of Present Illness    Patricia [name] is a 71 y.o. 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  ER visit 12/18/2020 for painful following and found to have esophagitis. During visit patient developed A-fib/RVR 110s-130s.  She was asymptomatic and was given a few doses of metoprolol started on flecainide and declined admission at that time.  She was seen in office on 12/15 still in atrial flutter her Lopressor was increased and a plan for outpatient DCCV. She was seen again in the ER on 12/22 for complaints of weakness possibly related to XRT treatments for lung cancer.  Her hemoglobin at that time was trending downward While in the emergency room she developed wheezing and shortness of breath and and found to be in A-fib/RVR. Eliquis was initiated and rate control was started Cardizem drip.   She presented to the ER on 02/11/2021 with SOB, and lower extremity edema.  Heart rate was in the 140's with AF/RVR.  Echo completed 9/22 with LVEF 60-65% and grade 1 DD.  She was diuresed with Lasix with improvement in volume status.  DCCV performed on 2/6 conversion to sinus rhythm but atrial flutter reoccurred.  Amiodarone 400 mg was added as well as digoxin 0.125 mg. She was cardioverted again 02/20/21 and maintained sinus rhythm and was discharged on p.o. amiodarone 200 mg, digoxin, and metoprolol 75 mg.    She was discharged 2/14 and seen in follow-up in heart failure clinic.  Digoxin levels were elevated at 1.8 and discontinued at HF clinic visit 2/27.  Contacted triage nurse 3/6 due to dizziness, and concern for increased blood pressure since digoxin and metoprolol decreased.  Plan for  metoprolol to be increased to 100 mg twice daily however patient return call the next day with complaints of blood pressures running 89/61 and increased heart rate to 120.  She was instructed not to increase metoprolol and is being seen today  Since discharge from hospital Patricia [name] reports doing better today but is still weak.  She is here today with her husband. She is ambulating with a walker she denies chest pain, palpitations, dyspnea, PND, orthopnea, nausea, vomiting, dizziness, syncope, edema, weight gain, or early satiety.  She appears euvolemic today on examination.  She is in 2:1 atrial flutter with a rate of 109, however she is asymptomatic and is not aware when her rhythm is abnormal.  She has completed chemotherapy and is currently having radiation therapy of the brain.  She is maintaining good fluid volume and keeping fluids below 1.5 L/day.  She is checking her blood pressure at home and weighing daily.  AAD hx  Started on Flecainide 12/20/20 > up-titrated 12/20/20  Amiodarone started 400 mg 02/11/21 titrated to 200 mg  Past Medical History    Past Medical History:  Diagnosis Date   Anemia    Anxiety    GERD (gastroesophageal reflux disease)    Hyperlipidemia    Hypertension    IBS (irritable bowel syndrome)    Insomnia    Low back pain    Lung cancer ([place]) 10/2020   TIA (transient ischemic attack) 10/2017   no deficits   Vitamin B12 deficiency  Vitamin D deficiency    Past Surgical History:  Procedure Laterality Date   BACK SURGERY     BREAST EXCISIONAL BIOPSY Left 1997   benign   BRONCHIAL BIOPSY  10/18/2020   Procedure: BRONCHIAL BIOPSIES;  Surgeon: [name], DO;  Location: [facility];  Service: Pulmonary;;   BRONCHIAL BRUSHINGS  10/18/2020   Procedure: BRONCHIAL BRUSHINGS;  Surgeon: [name], DO;  Location: [place];  Service: Pulmonary;;   BRONCHIAL NEEDLE ASPIRATION BIOPSY  10/18/2020   Procedure: BRONCHIAL NEEDLE ASPIRATION BIOPSIES;   Surgeon: [name], DO;  Location: [place];  Service: Pulmonary;;   BRONCHIAL WASHINGS  10/18/2020   Procedure: BRONCHIAL WASHINGS;  Surgeon: [name], DO;  Location: [place];  Service: Pulmonary;;   CARDIOVERSION N/A 02/13/2021   Procedure: CARDIOVERSION;  Surgeon: Werner Lean, MD;  Location: [place];  Service: Cardiovascular;  Laterality: N/A;   CARDIOVERSION N/A 02/20/2021   Procedure: CARDIOVERSION;  Surgeon: Skeet Latch, MD;  Location: [place];  Service: Cardiovascular;  Laterality: N/A;   hemorrhoidecotmy     HERNIA MESH REMOVAL     OPEN REDUCTION INTERNAL FIXATION (ORIF) DISTAL RADIAL FRACTURE Right 11/19/2019   Procedure: OPEN REDUCTION INTERNAL FIXATION (ORIF) DISTAL RADIUS AND ULNA FRACTURE WITH REPAIR AS NECESSARY;  Surgeon: Roseanne Kaufman, MD;  Location: [place];  Service: Orthopedics;  Laterality: Right;  2 hrs Block with IV sedation   ORIF RADIUS & ULNA FRACTURES     TONSILLECTOMY     VIDEO BRONCHOSCOPY WITH ENDOBRONCHIAL NAVIGATION Left 10/18/2020   Procedure: VIDEO BRONCHOSCOPY WITH ENDOBRONCHIAL NAVIGATION;  Surgeon: [name], DO;  Location: [place];  Service: Pulmonary;  Laterality: Left;  ION   VIDEO BRONCHOSCOPY WITH ENDOBRONCHIAL ULTRASOUND Bilateral 10/18/2020   Procedure: VIDEO BRONCHOSCOPY WITH ENDOBRONCHIAL ULTRASOUND;  Surgeon: [name], DO;  Location: [place];  Service: Pulmonary;  Laterality: Bilateral;   VIDEO BRONCHOSCOPY WITH RADIAL ENDOBRONCHIAL ULTRASOUND  10/18/2020   Procedure: VIDEO BRONCHOSCOPY WITH RADIAL ENDOBRONCHIAL ULTRASOUND;  Surgeon: [name], DO;  Location: [place];  Service: Pulmonary;;    Allergies  Allergies  Allergen Reactions   Amoxicillin-Pot Clavulanate Nausea Only and Other (See Comments)    GI upset Other reaction(s): stomach upset   Levaquin [Levofloxacin In D5w] Other (See Comments)    Joint problems    Magnesium Hydroxide Other (See  Comments)    Welts  Other reaction(s): welts   Oxycodone Other (See Comments)    Nausea  Other reaction(s): Nausea   Other Itching    Paper tape    Home Medications    Current Outpatient Medications  Medication Sig Dispense Refill   AMIODARONE HCL PO Take 75 mg by mouth 2 (two) times daily.     carboxymethylcellulose (REFRESH PLUS) 0.5 % SOLN Place 1 drop into both eyes 3 (three) times daily as needed (for dryness).      carisoprodol (SOMA) 350 MG tablet Take 350 mg by mouth 3 (three) times daily.   5   Cholecalciferol (VITAMIN D3) 50 MCG (2000 UT) TABS Take 2,000 Units by mouth daily.     clobetasol ointment (TEMOVATE) 9.02 % Apply 1 application topically See admin instructions. Apply to vaginal area daily as directed     clonazePAM (KLONOPIN) 1 MG tablet Take 1 mg by mouth at bedtime.   3   docusate sodium (COLACE) 100 MG capsule Take 1 capsule (100 mg total) by mouth 2 (two) times daily. 10 capsule 0   ELIQUIS 5 MG  TABS tablet TAKE 1 TABLET(5 MG) BY MOUTH TWICE DAILY 60 tablet 6   escitalopram (LEXAPRO) 10 MG tablet Take 1 tablet (10 mg total) by mouth daily. 30 tablet 2   fluticasone (FLONASE) 50 MCG/ACT nasal spray Place 2 sprays into both nostrils daily as needed for allergies or rhinitis.   5   furosemide (LASIX) 20 MG tablet Take 1 tablet (20 mg total) by mouth daily. Take only if weight goes above 180 lbs. 30 tablet 2   gabapentin (NEURONTIN) 600 MG tablet Take 600 mg by mouth See admin instructions. Take 600 mg by mouth in the morning and at lunchtime     guaiFENesin-dextromethorphan (ROBITUSSIN DM) 100-10 MG/5ML syrup Take 10 mLs by mouth every 4 (four) hours as needed for cough. 118 mL 0   ipratropium (ATROVENT) 0.02 % nebulizer solution Take by nebulization 3 (three) times daily. As directed     levalbuterol (XOPENEX) 1.25 MG/3ML nebulizer solution Inhale into the lungs as directed.     lidocaine (XYLOCAINE) 2 % solution Use as directed 15 mLs in the mouth or throat every  6 (six) hours as needed for mouth pain. 100 mL 0   linaclotide (LINZESS) 145 MCG CAPS capsule Take 1 capsule (145 mcg total) by mouth daily at 4 PM. 30 capsule 0   magic mouthwash SOLN Take 5 mLs by mouth 4 (four) times daily as needed for mouth pain. Pt is allergic to Magnesium 240 mL 1   magnesium oxide (MAG-OX) 400 (240 Mg) MG tablet Take 1 tablet (400 mg total) by mouth daily. 30 tablet 1   memantine (NAMENDA) 10 MG tablet Take 1 tablet (10 mg total) by mouth 2 (two) times daily. 60 tablet 4   memantine (NAMENDA) 5 MG tablet Begin this prescription the first day of brain radiation. Week 1: take one tablet po qam. Week 2: take one tablet qam and qpm. Week 3: take two tablets qam, and one tablet po q pm. Week 4: take two tablets qam and qpm. Fill subsequent prescription q month. 70 tablet 0   metoprolol tartrate (LOPRESSOR) 50 MG tablet Take 1.5 tablets (75 mg total) by mouth 2 (two) times daily. 270 tablet 0   nystatin (MYCOSTATIN) 100000 UNIT/ML suspension Take 5 mLs (500,000 Units total) by mouth 4 (four) times daily. 440 mL 5   omeprazole (PRILOSEC) 20 MG capsule Take 20 mg by mouth daily before breakfast.      ondansetron (ZOFRAN-ODT) 8 MG disintegrating tablet Take 8 mg by mouth daily as needed for nausea.     Polyethyl Glycol-Propyl Glycol (SYSTANE) 0.4-0.3 % GEL ophthalmic gel Place 1 application into both eyes at bedtime.     polyethylene glycol (MIRALAX / GLYCOLAX) packet Take 34 g by mouth in the morning.     prochlorperazine (COMPAZINE) 10 MG tablet Take 1 tablet (10 mg total) by mouth every 6 (six) hours as needed for nausea or vomiting. 30 tablet 0   rosuvastatin (CRESTOR) 5 MG tablet Take 5 mg by mouth 3 (three) times a week. Mondays Wednesdays Fridays     traMADol (ULTRAM) 50 MG tablet Take 100 mg by mouth in the morning, at noon, and at bedtime.     zaleplon (SONATA) 10 MG capsule Take 10 mg by mouth at bedtime as needed (for interrupted sleep).      No current  facility-administered medications for this visit.     Review of Systems   General:  No chills, fever, night sweats or weight changes.  Cardiovascular:  No chest pain, dyspnea on exertion, edema, orthopnea, palpitations, paroxysmal nocturnal dyspnea. Dermatological: No rash, lesions/masses Respiratory: No cough, dyspnea Urologic: No hematuria, dysuria Abdominal:   No nausea, vomiting, diarrhea, bright red blood per rectum, melena, or hematemesis Neurologic:  No visual changes, wkns, changes in mental status. All other systems reviewed and are otherwise negative except as noted above.    Physical Exam    Wt Readings from Last 3 Encounters:  03/17/21 172 lb 12.8 oz (78.4 kg)  03/06/21 173 lb (78.5 kg)  02/28/21 176 lb 5 oz (80 kg)    VS: Vitals:   03/17/21 1047  BP: (!) 100/50  Pulse: 68  SpO2: 95%     GEN: Well nourished, well developed, in no acute distress. Neck: Supple, no JVD, carotid bruits, or masses. Cardiac:  S1-S2,RRR, no murmurs, rubs, or gallops. No clubbing, cyanosis, edema.  Radials/PT 2+ and equal bilaterally.  Respiratory:  Respirations regular clear to auscultation bilaterally. MS: no deformity or atrophy. Skin: warm and dry, no rash. Neuro: Strength and sensation are intact. Psych: Normal affect.  Accessory Clinical Findings    ECG personally reviewed by me today -EKG today 2:1 atrial flutter with a rate of 109 with - no acute changes.  Risk Assessment/Calculations:    [id number] Score =  5 {Click here to calculate score.  REFRESH note before signing. :This indicates a  % annual risk of stroke. The patient's score is based upon:   Lab Results  Component Value Date   WBC 4.7 03/06/2021   HGB 11.6 (L) 03/06/2021   HCT 33.0 (L) 03/06/2021   MCV 104.4 (H) 03/06/2021   PLT 197 03/06/2021   Lab Results  Component Value Date   CREATININE 0.79 03/06/2021   BUN 8 03/06/2021   NA 135 03/06/2021   K 4.4 03/06/2021   CL 97 (L) 03/06/2021   CO2 27  03/06/2021   Lab Results  Component Value Date   ALT 17 03/06/2021   AST 22 03/06/2021   ALKPHOS 65 03/06/2021   BILITOT 0.4 03/06/2021   No results found for: CHOL, HDL, LDLCALC, LDLDIRECT, TRIG, CHOLHDL  No results found for: HGBA1C  Assessment & Plan    1.  Atrial flutter/atrial fib: -s/p DCCV while hospitalized.  She is currently in 2:1 atrial flutter, and is asymptomatic. Her heart rate is 109 bpm.   -She was instructed to monitor her heart rate and to contact us if it is sustained above 115 bpm. -She had complaints of labile blood pressures in the 88F systolic on 3/6, however pressure today is 100/50.  -Continue amiodarone 200 mg -Continue metoprolol 75 mg -CHA2DS2-VASc of 5, continue Eliquis 5 mg twice daily -BMET today to monitor sodium and renal status  2.  HFmEF: -Echo 02/11/21 LVEF: 45-45%, RV mildly enlarged -Patient was euvolemic on examination -GDMT on hold due to hypotension and low fluid volume -Last weight 173 lbs  on 2/27, weight today is 172 lbs -She is no longer taking salt tablets  3.  Small cell lung cancer: -S/p chemotherapy with cisplatin + etoposide and radiation - Follows with oncology  4.  Chronic respiratory failure: -On 2 L home O2  Disposition: Follow-up with Dr. Lovena Le in 1 month  Medication Adjustments/Labs and Tests Ordered: Current medicines are reviewed at length with the patient today.  Concerns regarding medicines are outlined above.  Tests Ordered: Orders Placed This Encounter  Procedures   Basic metabolic panel   EKG 02-DXAJ   Medication Changes: No orders of  the defined types were placed in this encounter.    Mable Fill, Marissa Nestle, NP 03/17/2021, 11:45 AM

## 2021-03-17 ENCOUNTER — Encounter: Payer: Self-pay | Admitting: Nurse Practitioner

## 2021-03-17 ENCOUNTER — Other Ambulatory Visit: Payer: Self-pay

## 2021-03-17 ENCOUNTER — Ambulatory Visit: Payer: Medicare HMO | Admitting: Nurse Practitioner

## 2021-03-17 ENCOUNTER — Ambulatory Visit
Admission: RE | Admit: 2021-03-17 | Discharge: 2021-03-17 | Disposition: A | Discharge status: Home / Self Care | Payer: Medicare HMO | Source: Ambulatory Visit | Attending: Radiation Oncology | Admitting: Radiation Oncology

## 2021-03-17 VITALS — BP 100/50 | HR 68 | Ht 66.0 in | Wt 172.8 lb

## 2021-03-17 DIAGNOSIS — C3432 Malignant neoplasm of lower lobe, left bronchus or lung: Secondary | ICD-10-CM | POA: Diagnosis not present

## 2021-03-17 DIAGNOSIS — I4892 Unspecified atrial flutter: Secondary | ICD-10-CM | POA: Diagnosis not present

## 2021-03-17 DIAGNOSIS — E876 Hypokalemia: Secondary | ICD-10-CM | POA: Diagnosis not present

## 2021-03-17 DIAGNOSIS — C3490 Malignant neoplasm of unspecified part of unspecified bronchus or lung: Secondary | ICD-10-CM | POA: Diagnosis not present

## 2021-03-17 DIAGNOSIS — D701 Agranulocytosis secondary to cancer chemotherapy: Secondary | ICD-10-CM | POA: Diagnosis not present

## 2021-03-17 DIAGNOSIS — Z510 Encounter for antineoplastic radiation therapy: Secondary | ICD-10-CM | POA: Diagnosis not present

## 2021-03-17 DIAGNOSIS — Z5189 Encounter for other specified aftercare: Secondary | ICD-10-CM | POA: Diagnosis not present

## 2021-03-17 DIAGNOSIS — T451X5A Adverse effect of antineoplastic and immunosuppressive drugs, initial encounter: Secondary | ICD-10-CM | POA: Diagnosis not present

## 2021-03-17 DIAGNOSIS — C771 Secondary and unspecified malignant neoplasm of intrathoracic lymph nodes: Secondary | ICD-10-CM | POA: Diagnosis not present

## 2021-03-17 DIAGNOSIS — I5020 Unspecified systolic (congestive) heart failure: Secondary | ICD-10-CM | POA: Diagnosis not present

## 2021-03-17 DIAGNOSIS — Z5111 Encounter for antineoplastic chemotherapy: Secondary | ICD-10-CM | POA: Diagnosis not present

## 2021-03-17 NOTE — Patient Instructions (Addendum)
Medication Instructions:    Your physician recommends that you continue on your current medications as directed. Please refer to the Current Medication list given to you today.  *If you need a refill on your cardiac medications before your next appointment, please call your pharmacy*   Lab Work: BMET  TODAY    If you have labs (blood work) drawn today and your tests are completely normal, you will receive your results only by: [place] (if you have MyChart) OR A paper copy in the mail If you have any lab test that is abnormal or we need to change your treatment, we will call you to review the results.   Testing/Procedures: BMET TODAY     Follow-Up: At [facility], you and your health needs are our priority.  As part of our continuing mission to provide you with exceptional heart care, we have created designated Provider Care Teams.  These Care Teams include your primary Cardiologist (physician) and Advanced Practice Providers (APPs -  Physician Assistants and Nurse Practitioners) who all work together to provide you with the care you need, when you need it.  We recommend signing up for the patient portal called "MyChart".  Sign up information is provided on this After Visit Summary.  MyChart is used to connect with patients for Virtual Visits (Telemedicine).  Patients are able to view lab/test results, encounter notes, upcoming appointments, etc.  Non-urgent messages can be sent to your provider as well.   To learn more about what you can do with MyChart, go to [email or web address].    Your next appointment:   1 month(s) ( CONTACT [facility] )   The format for your next appointment:   In Person  Provider:   Cristopher Peru, MD{     Other Instructions Low-Sodium Eating Plan Sodium, which is an element that makes up salt, helps you maintain a healthy balance of fluids in your body. Too much sodium can increase your blood pressure and cause fluid  and waste to be held in your body. Your health care provider or dietitian may recommend following this plan if you have high blood pressure (hypertension), kidney disease, liver disease, or heart failure. Eating less sodium can help lower your blood pressure, reduce swelling, and protect your heart, liver, and kidneys. What are tips for following this plan? Reading food labels The Nutrition Facts label lists the amount of sodium in one serving of the food. If you eat more than one serving, you must multiply the listed amount of sodium by the number of servings. Choose foods with less than 140 mg of sodium per serving. Avoid foods with 300 mg of sodium or more per serving. Shopping  Look for lower-sodium products, often labeled as "low-sodium" or "no salt added." Always check the sodium content, even if foods are labeled as "unsalted" or "no salt added." Buy fresh foods. Avoid canned foods and pre-made or frozen meals. Avoid canned, cured, or processed meats. Buy breads that have less than 80 mg of sodium per slice. Cooking  Eat more home-cooked food and less restaurant, buffet, and fast food. Avoid adding salt when cooking. Use salt-free seasonings or herbs instead of table salt or sea salt. Check with your health care provider or pharmacist before using salt substitutes. Cook with plant-based oils, such as canola, sunflower, or olive oil. Meal planning When eating at a restaurant, ask that your food be prepared with less salt or no salt, if possible. Avoid dishes labeled as brined,  pickled, cured, smoked, or made with soy sauce, miso, or teriyaki sauce. Avoid foods that contain MSG (monosodium glutamate). MSG is sometimes added to [place] food, bouillon, and some canned foods. Make meals that can be grilled, baked, poached, roasted, or steamed. These are generally made with less sodium. General information Most people on this plan should limit their sodium intake to 1,500-2,000 mg  (milligrams) of sodium each day. What foods should I eat? Fruits Fresh, frozen, or canned fruit. Fruit juice. Vegetables Fresh or frozen vegetables. "No salt added" canned vegetables. "No salt added" tomato sauce and paste. Low-sodium or reduced-sodium tomato and vegetable juice. Grains Low-sodium cereals, including oats, puffed wheat and rice, and shredded wheat. Low-sodium crackers. Unsalted rice. Unsalted pasta. Low-sodium bread. Whole-grain breads and whole-grain pasta. Meats and other proteins Fresh or frozen (no salt added) meat, poultry, seafood, and fish. Low-sodium canned tuna and salmon. Unsalted nuts. Dried peas, beans, and lentils without added salt. Unsalted canned beans. Eggs. Unsalted nut butters. Dairy Milk. Soy milk. Cheese that is naturally low in sodium, such as ricotta cheese, fresh mozzarella, or Swiss cheese. Low-sodium or reduced-sodium cheese. Cream cheese. Yogurt. Seasonings and condiments Fresh and dried herbs and spices. Salt-free seasonings. Low-sodium mustard and ketchup. Sodium-free salad dressing. Sodium-free light mayonnaise. Fresh or refrigerated horseradish. Lemon juice. Vinegar. Other foods Homemade, reduced-sodium, or low-sodium soups. Unsalted popcorn and pretzels. Low-salt or salt-free chips. The items listed above may not be a complete list of foods and beverages you can eat. Contact a dietitian for more information. What foods should I avoid? Vegetables Sauerkraut, pickled vegetables, and relishes. Olives. [place] fries. Onion rings. Regular canned vegetables (not low-sodium or reduced-sodium). Regular canned tomato sauce and paste (not low-sodium or reduced-sodium). Regular tomato and vegetable juice (not low-sodium or reduced-sodium). Frozen vegetables in sauces. Grains Instant hot cereals. Bread stuffing, pancake, and biscuit mixes. Croutons. Seasoned rice or pasta mixes. Noodle soup cups. Boxed or frozen macaroni and cheese. Regular salted crackers.  Self-rising flour. Meats and other proteins Meat or fish that is salted, canned, smoked, spiced, or pickled. Precooked or cured meat, such as sausages or meat loaves. Berniece Salines. Ham. Pepperoni. Hot dogs. Corned beef. Chipped beef. Salt pork. Jerky. Pickled herring. Anchovies and sardines. Regular canned tuna. Salted nuts. Dairy Processed cheese and cheese spreads. Hard cheeses. Cheese curds. Blue cheese. Feta cheese. String cheese. Regular cottage cheese. Buttermilk. Canned milk. Fats and oils Salted butter. Regular margarine. Ghee. Bacon fat. Seasonings and condiments Onion salt, garlic salt, seasoned salt, table salt, and sea salt. Canned and packaged gravies. Worcestershire sauce. Tartar sauce. Barbecue sauce. Teriyaki sauce. Soy sauce, including reduced-sodium. Steak sauce. Fish sauce. Oyster sauce. Cocktail sauce. Horseradish that you find on the shelf. Regular ketchup and mustard. Meat flavorings and tenderizers. Bouillon cubes. Hot sauce. Pre-made or packaged marinades. Pre-made or packaged taco seasonings. Relishes. Regular salad dressings. Salsa. Other foods Salted popcorn and pretzels. Corn chips and puffs. Potato and tortilla chips. Canned or dried soups. Pizza. Frozen entrees and pot pies. The items listed above may not be a complete list of foods and beverages you should avoid. Contact a dietitian for more information. Summary Eating less sodium can help lower your blood pressure, reduce swelling, and protect your heart, liver, and kidneys. Most people on this plan should limit their sodium intake to 1,500-2,000 mg (milligrams) of sodium each day. Canned, boxed, and frozen foods are high in sodium. Restaurant foods, fast foods, and pizza are also very high in sodium. You also get sodium by adding  salt to food. Try to cook at home, eat more fresh fruits and vegetables, and eat less fast food and canned, processed, or prepared foods. This information is not intended to replace advice given to  you by your health care provider. Make sure you discuss any questions you have with your health care provider. Document Revised: 01/30/2019 Document Reviewed: 11/26/2018 Elsevier Patient Education  2022 [employer].

## 2021-03-18 ENCOUNTER — Encounter: Payer: Self-pay | Admitting: Internal Medicine

## 2021-03-18 LAB — BASIC METABOLIC PANEL
BUN/Creatinine Ratio: 17 (ref 12–28)
BUN: 13 mg/dL (ref 8–27)
CO2: 26 mmol/L (ref 20–29)
Calcium: 9.1 mg/dL (ref 8.7–10.3)
Chloride: 93 mmol/L — ABNORMAL LOW (ref 96–106)
Creatinine, Ser: 0.76 mg/dL (ref 0.57–1.00)
Glucose: 91 mg/dL (ref 70–99)
Potassium: 4.7 mmol/L (ref 3.5–5.2)
Sodium: 133 mmol/L — ABNORMAL LOW (ref 134–144)
eGFR: 84 mL/min/{1.73_m2} (ref >59)

## 2021-03-20 ENCOUNTER — Other Ambulatory Visit: Payer: Self-pay

## 2021-03-20 ENCOUNTER — Ambulatory Visit
Admission: RE | Admit: 2021-03-20 | Discharge: 2021-03-20 | Disposition: A | Discharge status: Home / Self Care | Payer: Medicare HMO | Source: Ambulatory Visit | Attending: Radiation Oncology | Admitting: Radiation Oncology

## 2021-03-20 DIAGNOSIS — Z5111 Encounter for antineoplastic chemotherapy: Secondary | ICD-10-CM | POA: Diagnosis not present

## 2021-03-20 DIAGNOSIS — T451X5A Adverse effect of antineoplastic and immunosuppressive drugs, initial encounter: Secondary | ICD-10-CM | POA: Diagnosis not present

## 2021-03-20 DIAGNOSIS — C3432 Malignant neoplasm of lower lobe, left bronchus or lung: Secondary | ICD-10-CM | POA: Diagnosis not present

## 2021-03-20 DIAGNOSIS — E876 Hypokalemia: Secondary | ICD-10-CM | POA: Diagnosis not present

## 2021-03-20 DIAGNOSIS — C771 Secondary and unspecified malignant neoplasm of intrathoracic lymph nodes: Secondary | ICD-10-CM | POA: Diagnosis not present

## 2021-03-20 DIAGNOSIS — D701 Agranulocytosis secondary to cancer chemotherapy: Secondary | ICD-10-CM | POA: Diagnosis not present

## 2021-03-20 DIAGNOSIS — Z5189 Encounter for other specified aftercare: Secondary | ICD-10-CM | POA: Diagnosis not present

## 2021-03-20 DIAGNOSIS — Z510 Encounter for antineoplastic radiation therapy: Secondary | ICD-10-CM | POA: Diagnosis not present

## 2021-03-21 ENCOUNTER — Encounter: Payer: Self-pay | Admitting: Student

## 2021-03-21 ENCOUNTER — Ambulatory Visit
Admission: RE | Admit: 2021-03-21 | Discharge: 2021-03-21 | Disposition: A | Discharge status: Home / Self Care | Payer: Medicare HMO | Source: Ambulatory Visit | Attending: Radiation Oncology | Admitting: Radiation Oncology

## 2021-03-21 ENCOUNTER — Ambulatory Visit (INDEPENDENT_AMBULATORY_CARE_PROVIDER_SITE_OTHER): Payer: Medicare HMO | Admitting: Student

## 2021-03-21 ENCOUNTER — Encounter: Payer: Self-pay | Admitting: Radiation Oncology

## 2021-03-21 VITALS — BP 84/60 | HR 112 | Ht 66.0 in | Wt 172.0 lb

## 2021-03-21 DIAGNOSIS — I10 Essential (primary) hypertension: Secondary | ICD-10-CM | POA: Diagnosis not present

## 2021-03-21 DIAGNOSIS — Z5111 Encounter for antineoplastic chemotherapy: Secondary | ICD-10-CM | POA: Diagnosis not present

## 2021-03-21 DIAGNOSIS — I480 Paroxysmal atrial fibrillation: Secondary | ICD-10-CM | POA: Diagnosis not present

## 2021-03-21 DIAGNOSIS — C3490 Malignant neoplasm of unspecified part of unspecified bronchus or lung: Secondary | ICD-10-CM | POA: Diagnosis not present

## 2021-03-21 DIAGNOSIS — Z5189 Encounter for other specified aftercare: Secondary | ICD-10-CM | POA: Diagnosis not present

## 2021-03-21 DIAGNOSIS — J9611 Chronic respiratory failure with hypoxia: Secondary | ICD-10-CM | POA: Diagnosis not present

## 2021-03-21 DIAGNOSIS — E785 Hyperlipidemia, unspecified: Secondary | ICD-10-CM

## 2021-03-21 DIAGNOSIS — C3432 Malignant neoplasm of lower lobe, left bronchus or lung: Secondary | ICD-10-CM | POA: Diagnosis not present

## 2021-03-21 DIAGNOSIS — I5020 Unspecified systolic (congestive) heart failure: Secondary | ICD-10-CM

## 2021-03-21 DIAGNOSIS — I471 Supraventricular tachycardia: Secondary | ICD-10-CM

## 2021-03-21 DIAGNOSIS — D61818 Other pancytopenia: Secondary | ICD-10-CM

## 2021-03-21 DIAGNOSIS — Z510 Encounter for antineoplastic radiation therapy: Secondary | ICD-10-CM | POA: Diagnosis not present

## 2021-03-21 DIAGNOSIS — I4892 Unspecified atrial flutter: Secondary | ICD-10-CM

## 2021-03-21 DIAGNOSIS — D701 Agranulocytosis secondary to cancer chemotherapy: Secondary | ICD-10-CM | POA: Diagnosis not present

## 2021-03-21 DIAGNOSIS — E876 Hypokalemia: Secondary | ICD-10-CM | POA: Diagnosis not present

## 2021-03-21 DIAGNOSIS — T451X5A Adverse effect of antineoplastic and immunosuppressive drugs, initial encounter: Secondary | ICD-10-CM | POA: Diagnosis not present

## 2021-03-21 DIAGNOSIS — C771 Secondary and unspecified malignant neoplasm of intrathoracic lymph nodes: Secondary | ICD-10-CM | POA: Diagnosis not present

## 2021-03-21 MED ORDER — AMIODARONE HCL 200 MG PO TABS: 200.0000 mg | ORAL_TABLET | Freq: Two times a day (BID) | ORAL | 1 refills | Status: DC

## 2021-03-21 NOTE — Anesthesia Preprocedure Evaluation (Addendum)
Anesthesia Evaluation  ?Patient identified by MRN, date of birth, ID band ?Patient awake ? ? ? ?Reviewed: ?Allergy & Precautions, NPO status , Patient's Chart, lab work & pertinent test results ? ?Airway ?Mallampati: I ? ?TM Distance: >3 FB ?Neck ROM: Full ? ? ? Dental ?no notable dental hx. ?(+) Teeth Intact, Dental Advisory Given ?  ?Pulmonary ?asthma , former smoker,  ?Left lung CA ?  ?Pulmonary exam normal ?breath sounds clear to auscultation ? ? ? ? ? ? Cardiovascular ?hypertension, Pt. on medications ?+CHF  ?Normal cardiovascular exam+ dysrhythmias Atrial Fibrillation  ?Rhythm:Irregular Rate:Normal ? ?TTE 2023 ??1. Left ventricular ejection fraction, by estimation, is 45 to 50%. The  ?left ventricle has mildly decreased function. The left ventricle  ?demonstrates global hypokinesis.  ??2. The right ventricular size is mildly enlarged. There is mildly  ?elevated pulmonary artery systolic pressure. The estimated right  ?ventricular systolic pressure is 40.7 mmHg.  ??3. Right atrial size was mildly dilated.  ??4. Moderate pleural effusion in the left lateral region.  ??5. Tricuspid valve regurgitation is moderate.  ??6. The inferior vena cava is dilated in size with <50% respiratory  ?variability, suggesting right atrial pressure of 15 mmHg. ?  ?Neuro/Psych ?PSYCHIATRIC DISORDERS Anxiety TIA  ? GI/Hepatic ?Neg liver ROS, GERD  ,  ?Endo/Other  ?negative endocrine ROS ? Renal/GU ?negative Renal ROS  ?negative genitourinary ?  ?Musculoskeletal ?negative musculoskeletal ROS ?(+)  ? Abdominal ?  ?Peds ? Hematology ?negative hematology ROS ?(+)   ?Anesthesia Other Findings ? ? Reproductive/Obstetrics ? ?  ? ? ? ? ? ? ? ? ? ? ? ? ? ?  ?  ? ? ? ? ? ? ? ?Anesthesia Physical ?Anesthesia Plan ? ?ASA: 3 ? ?Anesthesia Plan: General  ? ?Post-op Pain Management:   ? ?Induction: Intravenous ? ?PONV Risk Score and Plan: Propofol infusion and Treatment may vary due to age or medical  condition ? ?Airway Management Planned: Natural Airway ? ?Additional Equipment:  ? ?Intra-op Plan:  ? ?Post-operative Plan:  ? ?Informed Consent: I have reviewed the patients History and Physical, chart, labs and discussed the procedure including the risks, benefits and alternatives for the proposed anesthesia with the patient or authorized representative who has indicated his/her understanding and acceptance.  ? ? ? ?Dental advisory given ? ?Plan Discussed with: CRNA ? ?Anesthesia Plan Comments:   ? ? ? ? ? ? ?Anesthesia Quick Evaluation ? ?

## 2021-03-21 NOTE — Patient Instructions (Signed)
Medication Instructions:  ?INCREASE Amiodarone to 200 mg 2 times a day  ? ?*If you need a refill on your cardiac medications before your next appointment, please call your pharmacy* ? ?Lab Work: ?Your physician recommends that you return for lab work TODAY:  ?CBC ?Magnesium  ?TSH+Free T4 ? ?If you have labs (blood work) drawn today and your tests are completely normal, you will receive your results only by: ?MyChart Message (if you have MyChart) OR ?A paper copy in the mail ?If you have any lab test that is abnormal or we need to change your treatment, we will call you to review the results. ? ?Testing/Procedures: ?Your physician has recommended that you have a Cardioversion (DCCV). Electrical Cardioversion uses a jolt of electricity to your heart either through paddles or wired patches attached to your chest. This is a controlled, usually prescheduled, procedure. Defibrillation is done under light anesthesia in the hospital, and you usually go home the day of the procedure. This is done to get your heart back into a normal rhythm. You are not awake for the procedure. Please see the instruction sheet given to you today. ? ? ?Follow-Up: ?At [facility], you and your health needs are our priority.  As part of our continuing mission to provide you with exceptional heart care, we have created designated Provider Care Teams.  These Care Teams include your primary Cardiologist (physician) and Advanced Practice Providers (APPs -  Physician Assistants and Nurse Practitioners) who [name] work together to provide you with the care you need, when you need it.  ? ?Your next appointment:   ?3 month(s) ? ?The format for your next appointment:   ?In Person ? ?Provider:   ?Sande Rives, PA-C      ? ? ?Other Instructions ? ?Dear Patricia [name], ? ?You are scheduled for a Cardioversion on Wednesday 03/22/21 with Dr. Johney Frame.  Please arrive at the [facility] (Main Entrance A) at [facility]: [address]  [place], [place] [address] at 8:30 am/pm. (1 hour prior to procedure unless lab work is needed; if lab work is needed arrive 1.5 hours ahead) ? ?DIET: Nothing to eat or drink after midnight except a sip of water with medications (see medication instructions below) ? ?FYI: For your safety, and to allow [place] to monitor your vital signs accurately during the surgery/procedure we request that   ?if you have artificial nails, gel coating, SNS etc. Please have those removed prior to your surgery/procedure. Not having the nail coverings /polish removed may result in cancellation or delay of your surgery/procedure. ? ? ?Medication Instructions: ? ? ?Continue your anticoagulant: Eliquis  ?You will need to continue your anticoagulant after your procedure until you  are told by your  ?Provider that it is safe to stop ? ? ?Labs: If patient is on Coumadin, patient needs pt/INR, CBC, BMET within 3 days (No pt/INR needed for patients taking Xarelto, Eliquis, Pradaxa) ?For patients receiving anesthesia for TEE and [name] Cardioversion patients: BMET, CBC within 1 week ? ?Come to:  ?(Lab option #1) Come to the lab at [facility] between the hours of 8:00 am and 4:30 pm. You do not have to be fasting. ?(Lab option #2) Lab at an alternate location ?(Lab option #3) your lab work will be done at the hospital prior to your procedure - you will need to arrive 1 ? hours ahead of your procedure ? ?You must have a responsible person to drive you home and stay in the  waiting area during your procedure. Failure to do so could result in cancellation. ? ?Interior and spatial designer cards. ? ?*Special Note: Every effort is made to have your procedure done on time. Occasionally there are emergencies that occur at the hospital that may cause delays. Please be patient if a delay does occur.  ? ? ?

## 2021-03-22 ENCOUNTER — Ambulatory Visit (HOSPITAL_COMMUNITY): Payer: Medicare HMO | Admitting: Anesthesiology

## 2021-03-22 ENCOUNTER — Encounter (HOSPITAL_COMMUNITY): Payer: Self-pay | Admitting: Cardiology

## 2021-03-22 ENCOUNTER — Ambulatory Visit (HOSPITAL_BASED_OUTPATIENT_CLINIC_OR_DEPARTMENT_OTHER): Payer: Medicare HMO | Admitting: Anesthesiology

## 2021-03-22 ENCOUNTER — Ambulatory Visit (HOSPITAL_COMMUNITY)
Admission: RE | Admit: 2021-03-22 | Discharge: 2021-03-22 | Disposition: A | Discharge status: Home / Self Care | Payer: Medicare HMO | Source: Ambulatory Visit | Attending: Cardiology | Admitting: Cardiology

## 2021-03-22 ENCOUNTER — Encounter (HOSPITAL_COMMUNITY)
Admission: RE | Disposition: A | Discharge status: Home / Self Care | Payer: Self-pay | Source: Ambulatory Visit | Attending: Cardiology

## 2021-03-22 DIAGNOSIS — K589 Irritable bowel syndrome without diarrhea: Secondary | ICD-10-CM | POA: Insufficient documentation

## 2021-03-22 DIAGNOSIS — E785 Hyperlipidemia, unspecified: Secondary | ICD-10-CM | POA: Insufficient documentation

## 2021-03-22 DIAGNOSIS — Z7901 Long term (current) use of anticoagulants: Secondary | ICD-10-CM | POA: Diagnosis not present

## 2021-03-22 DIAGNOSIS — Z87891 Personal history of nicotine dependence: Secondary | ICD-10-CM | POA: Insufficient documentation

## 2021-03-22 DIAGNOSIS — Z79899 Other long term (current) drug therapy: Secondary | ICD-10-CM | POA: Diagnosis not present

## 2021-03-22 DIAGNOSIS — Z85118 Personal history of other malignant neoplasm of bronchus and lung: Secondary | ICD-10-CM | POA: Insufficient documentation

## 2021-03-22 DIAGNOSIS — E222 Syndrome of inappropriate secretion of antidiuretic hormone: Secondary | ICD-10-CM | POA: Insufficient documentation

## 2021-03-22 DIAGNOSIS — J9611 Chronic respiratory failure with hypoxia: Secondary | ICD-10-CM | POA: Insufficient documentation

## 2021-03-22 DIAGNOSIS — Z923 Personal history of irradiation: Secondary | ICD-10-CM | POA: Diagnosis not present

## 2021-03-22 DIAGNOSIS — I5022 Chronic systolic (congestive) heart failure: Secondary | ICD-10-CM | POA: Diagnosis not present

## 2021-03-22 DIAGNOSIS — I110 Hypertensive heart disease with heart failure: Secondary | ICD-10-CM

## 2021-03-22 DIAGNOSIS — K219 Gastro-esophageal reflux disease without esophagitis: Secondary | ICD-10-CM | POA: Insufficient documentation

## 2021-03-22 DIAGNOSIS — I4892 Unspecified atrial flutter: Secondary | ICD-10-CM | POA: Diagnosis not present

## 2021-03-22 DIAGNOSIS — Z8673 Personal history of transient ischemic attack (TIA), and cerebral infarction without residual deficits: Secondary | ICD-10-CM | POA: Insufficient documentation

## 2021-03-22 DIAGNOSIS — I509 Heart failure, unspecified: Secondary | ICD-10-CM | POA: Diagnosis not present

## 2021-03-22 DIAGNOSIS — Z9981 Dependence on supplemental oxygen: Secondary | ICD-10-CM | POA: Diagnosis not present

## 2021-03-22 DIAGNOSIS — I471 Supraventricular tachycardia: Secondary | ICD-10-CM | POA: Diagnosis not present

## 2021-03-22 DIAGNOSIS — D61810 Antineoplastic chemotherapy induced pancytopenia: Secondary | ICD-10-CM | POA: Insufficient documentation

## 2021-03-22 DIAGNOSIS — F419 Anxiety disorder, unspecified: Secondary | ICD-10-CM | POA: Diagnosis not present

## 2021-03-22 DIAGNOSIS — I4891 Unspecified atrial fibrillation: Secondary | ICD-10-CM

## 2021-03-22 LAB — CBC
Hematocrit: 30.8 % — ABNORMAL LOW (ref 34.0–46.6)
Hemoglobin: 10.9 g/dL — ABNORMAL LOW (ref 11.1–15.9)
MCH: 37.3 pg — ABNORMAL HIGH (ref 26.6–33.0)
MCHC: 35.4 g/dL (ref 31.5–35.7)
MCV: 106 fL — ABNORMAL HIGH (ref 79–97)
Platelets: 158 10*3/uL (ref 150–450)
RBC: 2.92 x10E6/uL — ABNORMAL LOW (ref 3.77–5.28)
WBC: 4.9 10*3/uL (ref 3.4–10.8)

## 2021-03-22 LAB — TSH+FREE T4
Free T4: 1.39 ng/dL (ref 0.82–1.77)
TSH: 1.91 u[IU]/mL (ref 0.450–4.500)

## 2021-03-22 LAB — MAGNESIUM: Magnesium: 2 mg/dL (ref 1.6–2.3)

## 2021-03-22 SURGERY — CARDIOVERSION
Anesthesia: General

## 2021-03-22 MED ORDER — LACTATED RINGERS IV SOLN: INTRAVENOUS | Status: DC | PRN

## 2021-03-22 MED ORDER — PROPOFOL 10 MG/ML IV BOLUS
INTRAVENOUS | Status: DC | PRN
  Administered 2021-03-22: 40 mg via INTRAVENOUS

## 2021-03-22 MED ORDER — SODIUM CHLORIDE 0.9 % IV SOLN: INTRAVENOUS | Status: DC

## 2021-03-22 MED ORDER — LIDOCAINE 2% (20 MG/ML) 5 ML SYRINGE
INTRAMUSCULAR | Status: DC | PRN
  Administered 2021-03-22: 60 mg via INTRAVENOUS

## 2021-03-22 NOTE — Discharge Instructions (Signed)

## 2021-03-22 NOTE — Telephone Encounter (Signed)
Pt saw Sande Rives 3-15 and has appt scheduled with Dr Curt Bears ?

## 2021-03-22 NOTE — Interval H&P Note (Signed)
History and Physical Interval Note: ? ?03/22/2021 ?9:08 AM ? ?Patricia [name]  has presented today for surgery, with the diagnosis of afib.  The various methods of treatment have been discussed with the patient and family. After consideration of risks, benefits and other options for treatment, the patient has consented to  Procedure(s): ?CARDIOVERSION (N/A) as a surgical intervention.  The patient's history has been reviewed, patient examined, no change in status, stable for surgery.  I have reviewed the patient's chart and labs.  Questions were answered to the patient's satisfaction.   ? ? ?Freada Bergeron ? ? ?

## 2021-03-22 NOTE — Transfer of Care (Signed)
Immediate Anesthesia Transfer of Care Note ? ?Patient: Patricia [name] ? ?Procedure(s) Performed: CARDIOVERSION ? ?Patient Location: PACU and Endoscopy Unit ? ?Anesthesia Type:General ? ?Level of Consciousness: awake, alert  and patient cooperative ? ?Airway & Oxygen Therapy: Patient Spontanous Breathing and Patient connected to nasal cannula oxygen ? ?Post-op Assessment: Report given to RN and Post -op Vital signs reviewed and stable ? ?Post vital signs: Reviewed and stable ? ?Last Vitals:  ?Vitals Value Taken Time  ?BP 110/69   ?Temp    ?Pulse 59   ?Resp 14   ?SpO2 100   ? ? ?Last Pain:  ?Vitals:  ? 03/22/21 0850  ?TempSrc: Temporal  ?PainSc: 0-No pain  ?   ? ?  ? ?Complications: No notable events documented. ?

## 2021-03-22 NOTE — CV Procedure (Signed)
Procedure: Electrical Cardioversion ?Indications:  Atrial Flutter ? ?Procedure Details: ? ?Consent: Risks of procedure as well as the alternatives and risks of each were explained to the (patient/caregiver).  Consent for procedure obtained. ? ?Time Out: Verified patient identification, verified procedure, site/side was marked, verified correct patient position, special equipment/implants available, medications/allergies/relevent history reviewed, required imaging and test results available. PERFORMED. ? ?Patient placed on cardiac monitor, pulse oximetry, supplemental oxygen as necessary.  ?Sedation given:  Propofol 40mg ; lidocaine 60mg  ?Pacer pads placed anterior and posterior chest. ? ?Cardioverted 1 time(s).  ?Cardioversion with synchronized biphasic 150J shock. ? ?Evaluation: ?Findings: Post procedure EKG shows: NSR ?Complications: None ?Patient did tolerate procedure well. ? ?Time Spent Directly with the Patient: ? ?93minutes  ? ?Freada Bergeron ?03/22/2021, 9:23 AM  ?

## 2021-03-22 NOTE — Anesthesia Postprocedure Evaluation (Signed)
Anesthesia Post Note ? ?Patient: Patricia [name] ? ?Procedure(s) Performed: CARDIOVERSION ? ?  ? ?Patient location during evaluation: Endoscopy ?Anesthesia Type: General ?Level of consciousness: awake and alert ?Pain management: pain level controlled ?Vital Signs Assessment: post-procedure vital signs reviewed and stable ?Respiratory status: spontaneous breathing, nonlabored ventilation, respiratory function stable and patient connected to nasal cannula oxygen ?Cardiovascular status: blood pressure returned to baseline and stable ?Postop Assessment: no apparent nausea or vomiting ?Anesthetic complications: no ? ? ?No notable events documented. ? ?Last Vitals:  ?Vitals:  ? 03/22/21 0950 03/22/21 0955  ?BP: 98/65 (!) 104/53  ?Pulse: (!) 58 (!) 58  ?Resp: (!) 21 14  ?Temp:    ?SpO2: 100% 100%  ?  ?Last Pain:  ?Vitals:  ? 03/22/21 0955  ?TempSrc:   ?PainSc: 0-No pain  ? ? ?  ?  ?  ?  ?  ?  ? ?Serge Main L Mete Purdum ? ? ? ? ?

## 2021-03-23 ENCOUNTER — Encounter (HOSPITAL_COMMUNITY): Payer: Self-pay | Admitting: Cardiology

## 2021-04-03 DIAGNOSIS — I4891 Unspecified atrial fibrillation: Secondary | ICD-10-CM | POA: Diagnosis not present

## 2021-04-04 ENCOUNTER — Encounter: Payer: Self-pay | Admitting: Internal Medicine

## 2021-04-04 NOTE — Progress Notes (Signed)
? ?                                                                                                                                                          ?  Patient Name: Patricia [name] ?MRN: [id number] ?DOB: 28-Mar-1950 ?Referring Physician: June Leap ?Date of Service: 03/21/2021 ?[facility] [place] ? ?                                                      End Of Treatment Note ? ?Diagnoses: C34.32-Malignant neoplasm of lower lobe, left bronchus or lung ? 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 ?Intent: Palliative ? ?Radiation Treatment Dates: 03/08/2021 through 03/21/2021 ?Site Technique Total Dose (Gy) Dose per Fx (Gy) Completed Fx Beam Energies  ?Brain: ?Prophylactic Whole  Brain Treatment IMRT 25/25 2.5 10/10 6X  ? ?Narrative: The patient tolerated radiation therapy relatively well.  She was started on Namenda at the beginning of radiation. ? ?Plan: The patient will receive a call in about one month from the radiation oncology department. She will continue follow up with Dr. Julien Nordmann as well.  ? ?________________________________________________ ? ? ? ?Carola Rhine, PAC  ?

## 2021-04-05 ENCOUNTER — Telehealth: Payer: Self-pay | Admitting: Cardiology

## 2021-04-05 MED ORDER — METOPROLOL TARTRATE 50 MG PO TABS: 50.0000 mg | ORAL_TABLET | Freq: Two times a day (BID) | ORAL | 0 refills | Status: DC

## 2021-04-05 NOTE — Telephone Encounter (Signed)
Spoke with pt, Aware of dr Jacalyn Lefevre recommendations.  ?She has a lot of the 100 mg tablets that she will cut in 1/2. She will let us know when a refill is needed. She will continue to monitor her weight and blood pressure. ?

## 2021-04-05 NOTE — Telephone Encounter (Signed)
Spoke with pt, she reports she took linzess yesterday and "pooped out 5 lbs." Today she is feeling weak and her blood pressure is running 101/62. Her weight today is 167 lb, her dry weight is 170 lb. She is taking furosemide only if her weight is up 3 lbs overnight. She is also on metoprolol tartrate 75 mg twice daily. Blood pressures prior to today have been 109/66/53, 108/74/55 and 97/60/62. She feels like she is drinking plenty of fluids. She does reports dizziness at times when she stands from sitting. Aware will forward to dr Stanford Breed to review. ?

## 2021-04-05 NOTE — Telephone Encounter (Signed)
Patient had a phone visit with the Pharmacist at her PCP office today. The patient reported fatigue, loss of appetite, diarrhea without nausea or vomiting, and a 6 lb wt loss over a few days. ?The patient  has had good BP and HR readings (Pharmacy assistant reported BP of 90-100/50-60 and HR in the 50-60 range. Exact numbers were not provided) ? ?The pharmacist assistant just wanted to report these details to Dr. Stanford Breed  ?

## 2021-04-07 DIAGNOSIS — E7800 Pure hypercholesterolemia, unspecified: Secondary | ICD-10-CM | POA: Diagnosis not present

## 2021-04-07 DIAGNOSIS — I10 Essential (primary) hypertension: Secondary | ICD-10-CM | POA: Diagnosis not present

## 2021-04-07 DIAGNOSIS — I5033 Acute on chronic diastolic (congestive) heart failure: Secondary | ICD-10-CM | POA: Diagnosis not present

## 2021-04-14 ENCOUNTER — Other Ambulatory Visit: Payer: Self-pay | Admitting: Radiation Therapy

## 2021-04-14 DIAGNOSIS — C7931 Secondary malignant neoplasm of brain: Secondary | ICD-10-CM

## 2021-04-17 ENCOUNTER — Ambulatory Visit: Payer: Medicare HMO | Admitting: Internal Medicine

## 2021-04-24 ENCOUNTER — Ambulatory Visit (INDEPENDENT_AMBULATORY_CARE_PROVIDER_SITE_OTHER): Payer: Medicare HMO | Admitting: Cardiology

## 2021-04-24 ENCOUNTER — Encounter: Payer: Self-pay | Admitting: Cardiology

## 2021-04-24 ENCOUNTER — Ambulatory Visit
Admission: RE | Admit: 2021-04-24 | Discharge: 2021-04-24 | Disposition: A | Discharge status: Home / Self Care | Payer: Medicare HMO | Source: Ambulatory Visit | Attending: Radiation Oncology | Admitting: Radiation Oncology

## 2021-04-24 VITALS — BP 90/60 | HR 59 | Ht 66.0 in | Wt 166.6 lb

## 2021-04-24 DIAGNOSIS — C3432 Malignant neoplasm of lower lobe, left bronchus or lung: Secondary | ICD-10-CM

## 2021-04-24 DIAGNOSIS — I480 Paroxysmal atrial fibrillation: Secondary | ICD-10-CM

## 2021-04-24 MED ORDER — METOPROLOL SUCCINATE ER 50 MG PO TB24: 50.0000 mg | ORAL_TABLET | Freq: Every day | ORAL | 6 refills | Status: DC

## 2021-04-24 MED ORDER — AMIODARONE HCL 200 MG PO TABS: 200.0000 mg | ORAL_TABLET | Freq: Every day | ORAL | 3 refills | Status: DC

## 2021-04-24 NOTE — Patient Instructions (Addendum)
Medication Instructions:  ?Your physician has recommended you make the following change in your medication:  ?STOP Metoprolol (Lopressor) ?START Metoprolol (Toprol) 50 mg daily at bedtime ?DECREASE Amiodarone to 200 mg once daily ? ?*If you need a refill on your cardiac medications before your next appointment, please call your pharmacy* ? ? ?Lab Work: ?None ordered ? ? ?Testing/Procedures: ?None ordered ? ? ?Follow-Up: ?At [facility], you and your health needs are our priority.  As part of our continuing mission to provide you with exceptional heart care, we have created designated Provider Care Teams.  These Care Teams include your primary Cardiologist (physician) and Advanced Practice Providers (APPs -  Physician Assistants and Nurse Practitioners) who all work together to provide you with the care you need, when you need it. ? ?Your next appointment:   ?3 month(s) ? ?The format for your next appointment:   ?In Person ? ?Provider:   ?Tommye Standard, PA-C ? ? ? ?Thank you for choosing [facility]!! ? ? ?Trinidad Curet, RN ?[phone] ? ?  ?

## 2021-04-24 NOTE — Progress Notes (Signed)
?  Radiation Oncology         [phone] ?________________________________ ? ?Name: Patricia [name] MRN: [id number]  ?Date of Service: 04/24/2021  DOB: Dec 02, 1950 ? ?Post Treatment Telephone Note ? 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 ?Intent: Palliative ? ?Radiation Treatment Dates: 03/08/2021 through 03/21/2021 ?Site Technique Total Dose (Gy) Dose per Fx (Gy) Completed Fx Beam Energies  ?Brain: ?Prophylactic Whole  Brain Treatment IMRT 25/25 2.5 10/10 6X  ? ?Narrative: The patient tolerated radiation therapy relatively well.  She was started on Namenda at the beginning of radiation. She states she is doing well other than fatigue. ? ? ?Impression/Plan: ?1. 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 The patient has been doing well since completion of radiotherapy. We discussed that we would plan for a surveillance MRI brain in about 2 months, but she will also continue to follow up with Dr. Julien Nordmann in medical oncology.   ? ? ? ? ? ?Carola Rhine, PAC  ? ? ? ? ?

## 2021-04-24 NOTE — Progress Notes (Signed)
? ?Electrophysiology Office Note ? ? ?Date:  04/24/2021  ? ?ID:  Patricia [name], DOB June 14, 1950, MRN [id number] ? ?PCP:  [name], MD  ?Cardiologist:   ?Primary Electrophysiologist:  Tyse Auriemma Meredith Leeds, MD   ? ?Chief Complaint: SVT ?  ?History of Present Illness: ?Patricia [name] is a 71 y.o. female who is being seen today for the evaluation of SVT at the request of [name], MD. Presenting today for electrophysiology evaluation. ? ?She has a history significant for hypertension, hyperlipidemia, TIA, IBS, SVT.  She wore a cardiac monitor that showed episodes of SVT.  She went to the emergency room was treated with adenosine.  She tried vagal maneuvers.  She wore a cardiac monitor that showed a 2% atrial fibrillation burden was started on Eliquis.  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   ? ?He was readmitted to the hospital 12/26/2020 with worsening esophageal pain due to esophagitis felt from XRT.  She had progressive weakness and shortness of breath.  Anticoagulation was stopped.  She did go back into atrial flutter and had a cardioversion 03/22/2021.  She is currently on amiodarone. ? ?Today, denies symptoms of palpitations, chest pain, shortness of breath, orthopnea, PND, lower extremity edema, claudication, dizziness, presyncope, syncope, bleeding, or neurologic sequela. The patient is tolerating medications without difficulties.  Unfortunately she is continue to have episodes of fatigue and weakness.  She has finished XRT and chemotherapy.  She is working with PT at home.  She has follow-up with her oncologist in May. ? ?Past Medical History:  ?Diagnosis Date  ? Anemia   ? Anxiety   ? GERD (gastroesophageal reflux disease)   ? Hyperlipidemia   ? Hypertension   ? IBS (irritable bowel syndrome)   ? Insomnia   ? Low back pain   ? Lung cancer ([place]) 10/2020  ? TIA (transient ischemic attack) 10/2017  ? no deficits  ?  Vitamin B12 deficiency   ? Vitamin D deficiency   ? ?Past Surgical History:  ?Procedure Laterality Date  ? BACK SURGERY    ? BREAST EXCISIONAL BIOPSY Left 1997  ? benign  ? BRONCHIAL BIOPSY  10/18/2020  ? Procedure: BRONCHIAL BIOPSIES;  Surgeon: [name], DO;  Location: [place] [facility];  Service: Pulmonary;;  ? BRONCHIAL BRUSHINGS  10/18/2020  ? Procedure: BRONCHIAL BRUSHINGS;  Surgeon: [name], DO;  Location: [place] [facility];  Service: Pulmonary;;  ? BRONCHIAL NEEDLE ASPIRATION BIOPSY  10/18/2020  ? Procedure: BRONCHIAL NEEDLE ASPIRATION BIOPSIES;  Surgeon: [name], DO;  Location: [place];  Service: Pulmonary;;  ? BRONCHIAL WASHINGS  10/18/2020  ? Procedure: BRONCHIAL WASHINGS;  Surgeon: [name], DO;  Location: [place];  Service: Pulmonary;;  ? CARDIOVERSION N/A 02/13/2021  ? Procedure: CARDIOVERSION;  Surgeon: Werner Lean, MD;  Location: [place];  Service: Cardiovascular;  Laterality: N/A;  ? CARDIOVERSION N/A 02/20/2021  ? Procedure: CARDIOVERSION;  Surgeon: Skeet Latch, MD;  Location: [place];  Service: Cardiovascular;  Laterality: N/A;  ? CARDIOVERSION N/A 03/22/2021  ? Procedure: CARDIOVERSION;  Surgeon: Freada Bergeron, MD;  Location[facility];  Service: Cardiovascular;  Laterality: N/A;  ? hemorrhoidecotmy    ? HERNIA MESH REMOVAL    ? OPEN REDUCTION INTERNAL FIXATION (ORIF) DISTAL RADIAL FRACTURE Right 11/19/2019  ? Procedure: OPEN REDUCTION INTERNAL FIXATION (ORIF) DISTAL RADIUS AND ULNA FRACTURE WITH REPAIR AS NECESSARY;  Surgeon: Roseanne Kaufman, MD;  Location: [place];  Service: Orthopedics;  Laterality: Right;  2 hrs ?Block with IV sedation  ? ORIF RADIUS & ULNA FRACTURES    ? TONSILLECTOMY    ? VIDEO BRONCHOSCOPY WITH ENDOBRONCHIAL NAVIGATION Left 10/18/2020  ? Procedure: VIDEO BRONCHOSCOPY WITH ENDOBRONCHIAL NAVIGATION;  Surgeon: [name], DO;  Location: [place];  Service: Pulmonary;  Laterality: Left;  ION  ? VIDEO  BRONCHOSCOPY WITH ENDOBRONCHIAL ULTRASOUND Bilateral 10/18/2020  ? Procedure: VIDEO BRONCHOSCOPY WITH ENDOBRONCHIAL ULTRASOUND;  Surgeon: [name], DO;  Location: [place];  Service: Pulmonary;  Laterality: Bilateral;  ? VIDEO BRONCHOSCOPY WITH RADIAL ENDOBRONCHIAL ULTRASOUND  10/18/2020  ? Procedure: VIDEO BRONCHOSCOPY WITH RADIAL ENDOBRONCHIAL ULTRASOUND;  Surgeon: [name], DO;  Location: [place] ENDOSCOPY;  Service: Pulmonary;;  ? ? ? ?Current Outpatient Medications  ?Medication Sig Dispense Refill  ? acetaminophen (TYLENOL) 325 MG tablet Take 650 mg by mouth every 6 (six) hours as needed for headache.    ? albuterol (VENTOLIN HFA) 108 (90 Base) MCG/ACT inhaler Inhale 2 puffs into the lungs every 6 (six) hours as needed for shortness of breath.    ? amiodarone (PACERONE) 200 MG tablet Take 1 tablet (200 mg total) by mouth daily. 90 tablet 3  ? Black Cohosh (REMIFEMIN PO) Take 1 tablet by mouth 2 (two) times daily.    ? carboxymethylcellulose (REFRESH PLUS) 0.5 % SOLN Place 1 drop into both eyes 3 (three) times daily as needed (for dryness).     ? carisoprodol (SOMA) 350 MG tablet Take 350 mg by mouth 3 (three) times daily.   5  ? Cholecalciferol (VITAMIN D3 PO) Take 2 capsules by mouth daily.    ? clobetasol ointment (TEMOVATE) 6.73 % Apply 1 application. topically daily as needed (vaginal irritation).    ? clonazePAM (KLONOPIN) 1 MG tablet Take 1 mg by mouth at bedtime.   3  ? Coenzyme Q10 200 MG capsule Take 400 mg by mouth daily.    ? Cyanocobalamin (B-12 PO) Take 2 capsules by mouth daily.    ? docusate sodium (COLACE) 100 MG capsule Take 1 capsule (100 mg total) by mouth 2 (two) times daily. (Patient taking differently: Take 100 mg by mouth 2 (two) times daily as needed for moderate constipation.) 10 capsule 0  ? ELIQUIS 5 MG TABS tablet TAKE 1 TABLET(5 MG) BY MOUTH TWICE DAILY 60 tablet 6  ? escitalopram (LEXAPRO) 10 MG tablet Take 1 tablet (10 mg total) by mouth daily. 30 tablet 2  ?  fluticasone (FLONASE) 50 MCG/ACT nasal spray Place 2 sprays into both nostrils daily.  5  ? furosemide (LASIX) 20 MG tablet Take 1 tablet (20 mg total) by mouth daily. Take only if weight goes above 180 lbs. (Patient taking differently: Take 20 mg by mouth daily as needed (weight gain of 3 - 4 lbs overnight).) 30 tablet 2  ? gabapentin (NEURONTIN) 600 MG tablet Take 600 mg by mouth 2 (two) times daily. in the morning and at lunchtime    ? guaiFENesin-dextromethorphan (ROBITUSSIN DM) 100-10 MG/5ML syrup Take 10 mLs by mouth every 4 (four) hours as needed for cough. 118 mL 0  ? hydrOXYzine (ATARAX) 25 MG tablet Take 25 mg by mouth at bedtime as needed for sleep.    ? ipratropium (ATROVENT) 0.02 % nebulizer solution Take 0.5 mg by nebulization 3 (three) times daily. As directed    ? levalbuterol (XOPENEX) 1.25 MG/3ML nebulizer solution Inhale 1.25 mg into the lungs 3 (three) times daily.    ? lidocaine (XYLOCAINE)  2 % solution Use as directed 15 mLs in the mouth or throat every 6 (six) hours as needed for mouth pain. 100 mL 0  ? linaclotide (LINZESS) 145 MCG CAPS capsule Take 1 capsule (145 mcg total) by mouth daily at 4 PM. 30 capsule 0  ? magic mouthwash SOLN Take 5 mLs by mouth 4 (four) times daily as needed for mouth pain. Pt is allergic to Magnesium 240 mL 1  ? magnesium oxide (MAG-OX) 400 (240 Mg) MG tablet Take 1 tablet (400 mg total) by mouth daily. 30 tablet 1  ? memantine (NAMENDA) 10 MG tablet Take 1 tablet (10 mg total) by mouth 2 (two) times daily. 60 tablet 4  ? memantine (NAMENDA) 5 MG tablet Begin this prescription the first day of brain radiation. Week 1: take one tablet po qam. Week 2: take one tablet qam and qpm. Week 3: take two tablets qam, and one tablet po q pm. Week 4: take two tablets qam and qpm. Fill subsequent prescription q month. (Patient taking differently: Take 10 mg by mouth 2 (two) times daily.) 70 tablet 0  ? metoprolol succinate (TOPROL-XL) 50 MG 24 hr tablet Take 1 tablet (50 mg  total) by mouth at bedtime. Take with or immediately following a meal. 30 tablet 6  ? nystatin (MYCOSTATIN) 100000 UNIT/ML suspension Take 5 mLs (500,000 Units total) by mouth 4 (four) times daily. 440 mL 5  ? omepra

## 2021-04-27 DIAGNOSIS — Z79899 Other long term (current) drug therapy: Secondary | ICD-10-CM | POA: Diagnosis not present

## 2021-04-27 DIAGNOSIS — K219 Gastro-esophageal reflux disease without esophagitis: Secondary | ICD-10-CM | POA: Diagnosis not present

## 2021-04-27 DIAGNOSIS — Z0000 Encounter for general adult medical examination without abnormal findings: Secondary | ICD-10-CM | POA: Diagnosis not present

## 2021-04-27 DIAGNOSIS — D638 Anemia in other chronic diseases classified elsewhere: Secondary | ICD-10-CM | POA: Diagnosis not present

## 2021-04-27 DIAGNOSIS — F324 Major depressive disorder, single episode, in partial remission: Secondary | ICD-10-CM | POA: Diagnosis not present

## 2021-04-27 DIAGNOSIS — I5033 Acute on chronic diastolic (congestive) heart failure: Secondary | ICD-10-CM | POA: Diagnosis not present

## 2021-04-27 DIAGNOSIS — C3492 Malignant neoplasm of unspecified part of left bronchus or lung: Secondary | ICD-10-CM | POA: Diagnosis not present

## 2021-04-27 DIAGNOSIS — I10 Essential (primary) hypertension: Secondary | ICD-10-CM | POA: Diagnosis not present

## 2021-04-27 DIAGNOSIS — G4700 Insomnia, unspecified: Secondary | ICD-10-CM | POA: Diagnosis not present

## 2021-04-27 DIAGNOSIS — E7800 Pure hypercholesterolemia, unspecified: Secondary | ICD-10-CM | POA: Diagnosis not present

## 2021-04-27 DIAGNOSIS — E559 Vitamin D deficiency, unspecified: Secondary | ICD-10-CM | POA: Diagnosis not present

## 2021-05-04 DIAGNOSIS — I4891 Unspecified atrial fibrillation: Secondary | ICD-10-CM | POA: Diagnosis not present

## 2021-05-09 DIAGNOSIS — C3432 Malignant neoplasm of lower lobe, left bronchus or lung: Secondary | ICD-10-CM | POA: Diagnosis not present

## 2021-05-09 DIAGNOSIS — I4892 Unspecified atrial flutter: Secondary | ICD-10-CM | POA: Diagnosis not present

## 2021-05-09 DIAGNOSIS — D630 Anemia in neoplastic disease: Secondary | ICD-10-CM | POA: Diagnosis not present

## 2021-05-09 DIAGNOSIS — I5033 Acute on chronic diastolic (congestive) heart failure: Secondary | ICD-10-CM | POA: Diagnosis not present

## 2021-05-09 DIAGNOSIS — D701 Agranulocytosis secondary to cancer chemotherapy: Secondary | ICD-10-CM | POA: Diagnosis not present

## 2021-05-09 DIAGNOSIS — J439 Emphysema, unspecified: Secondary | ICD-10-CM | POA: Diagnosis not present

## 2021-05-09 DIAGNOSIS — I110 Hypertensive heart disease with heart failure: Secondary | ICD-10-CM | POA: Diagnosis not present

## 2021-05-09 DIAGNOSIS — J9621 Acute and chronic respiratory failure with hypoxia: Secondary | ICD-10-CM | POA: Diagnosis not present

## 2021-05-09 DIAGNOSIS — I480 Paroxysmal atrial fibrillation: Secondary | ICD-10-CM | POA: Diagnosis not present

## 2021-05-10 DIAGNOSIS — K219 Gastro-esophageal reflux disease without esophagitis: Secondary | ICD-10-CM | POA: Diagnosis not present

## 2021-05-10 DIAGNOSIS — I5033 Acute on chronic diastolic (congestive) heart failure: Secondary | ICD-10-CM | POA: Diagnosis not present

## 2021-05-10 DIAGNOSIS — E7800 Pure hypercholesterolemia, unspecified: Secondary | ICD-10-CM | POA: Diagnosis not present

## 2021-05-10 DIAGNOSIS — I10 Essential (primary) hypertension: Secondary | ICD-10-CM | POA: Diagnosis not present

## 2021-05-24 DIAGNOSIS — H35033 Hypertensive retinopathy, bilateral: Secondary | ICD-10-CM | POA: Diagnosis not present

## 2021-05-24 DIAGNOSIS — H2513 Age-related nuclear cataract, bilateral: Secondary | ICD-10-CM | POA: Diagnosis not present

## 2021-05-24 DIAGNOSIS — H04123 Dry eye syndrome of bilateral lacrimal glands: Secondary | ICD-10-CM | POA: Diagnosis not present

## 2021-05-24 DIAGNOSIS — H35373 Puckering of macula, bilateral: Secondary | ICD-10-CM | POA: Diagnosis not present

## 2021-05-29 ENCOUNTER — Inpatient Hospital Stay: Payer: Medicare HMO | Attending: Internal Medicine

## 2021-05-29 ENCOUNTER — Ambulatory Visit (HOSPITAL_COMMUNITY)
Admission: RE | Admit: 2021-05-29 | Discharge: 2021-05-29 | Disposition: A | Discharge status: Home / Self Care | Payer: Medicare HMO | Source: Ambulatory Visit | Attending: Physician Assistant | Admitting: Physician Assistant

## 2021-05-29 ENCOUNTER — Other Ambulatory Visit: Payer: Self-pay

## 2021-05-29 DIAGNOSIS — Z79899 Other long term (current) drug therapy: Secondary | ICD-10-CM | POA: Insufficient documentation

## 2021-05-29 DIAGNOSIS — C3490 Malignant neoplasm of unspecified part of unspecified bronchus or lung: Secondary | ICD-10-CM | POA: Diagnosis not present

## 2021-05-29 DIAGNOSIS — C3432 Malignant neoplasm of lower lobe, left bronchus or lung: Secondary | ICD-10-CM | POA: Diagnosis not present

## 2021-05-29 DIAGNOSIS — Z7901 Long term (current) use of anticoagulants: Secondary | ICD-10-CM | POA: Insufficient documentation

## 2021-05-29 DIAGNOSIS — D61818 Other pancytopenia: Secondary | ICD-10-CM | POA: Insufficient documentation

## 2021-05-29 DIAGNOSIS — I10 Essential (primary) hypertension: Secondary | ICD-10-CM | POA: Diagnosis not present

## 2021-05-29 DIAGNOSIS — I700 Atherosclerosis of aorta: Secondary | ICD-10-CM | POA: Diagnosis not present

## 2021-05-29 DIAGNOSIS — J90 Pleural effusion, not elsewhere classified: Secondary | ICD-10-CM | POA: Diagnosis not present

## 2021-05-29 DIAGNOSIS — J439 Emphysema, unspecified: Secondary | ICD-10-CM | POA: Diagnosis not present

## 2021-05-29 LAB — CMP (CANCER CENTER ONLY)
ALT: 20 U/L (ref 0–44)
AST: 21 U/L (ref 15–41)
Albumin: 4 g/dL (ref 3.5–5.0)
Alkaline Phosphatase: 66 U/L (ref 38–126)
Anion gap: 7 (ref 5–15)
BUN: 10 mg/dL (ref 8–23)
CO2: 33 mmol/L — ABNORMAL HIGH (ref 22–32)
Calcium: 9.1 mg/dL (ref 8.9–10.3)
Chloride: 90 mmol/L — ABNORMAL LOW (ref 98–111)
Creatinine: 0.94 mg/dL (ref 0.44–1.00)
GFR, Estimated: >60 mL/min (ref >60)
Glucose, Bld: 110 mg/dL — ABNORMAL HIGH (ref 70–99)
Potassium: 4.2 mmol/L (ref 3.5–5.1)
Sodium: 130 mmol/L — ABNORMAL LOW (ref 135–145)
Total Bilirubin: 0.4 mg/dL (ref 0.3–1.2)
Total Protein: 6.9 g/dL (ref 6.5–8.1)

## 2021-05-29 LAB — CBC WITH DIFFERENTIAL (CANCER CENTER ONLY)
Abs Immature Granulocytes: 0.01 10*3/uL (ref 0.00–0.07)
Basophils Absolute: 0 10*3/uL (ref 0.0–0.1)
Basophils Relative: 0 %
Eosinophils Absolute: 0.1 10*3/uL (ref 0.0–0.5)
Eosinophils Relative: 2 %
HCT: 32.5 % — ABNORMAL LOW (ref 36.0–46.0)
Hemoglobin: 11.5 g/dL — ABNORMAL LOW (ref 12.0–15.0)
Immature Granulocytes: 0 %
Lymphocytes Relative: 8 %
Lymphs Abs: 0.4 10*3/uL — ABNORMAL LOW (ref 0.7–4.0)
MCH: 38.7 pg — ABNORMAL HIGH (ref 26.0–34.0)
MCHC: 35.4 g/dL (ref 30.0–36.0)
MCV: 109.4 fL — ABNORMAL HIGH (ref 80.0–100.0)
Monocytes Absolute: 0.3 10*3/uL (ref 0.1–1.0)
Monocytes Relative: 7 %
Neutro Abs: 3.8 10*3/uL (ref 1.7–7.7)
Neutrophils Relative %: 83 %
Platelet Count: 126 10*3/uL — ABNORMAL LOW (ref 150–400)
RBC: 2.97 MIL/uL — ABNORMAL LOW (ref 3.87–5.11)
RDW: 13.2 % (ref 11.5–15.5)
WBC Count: 4.7 10*3/uL (ref 4.0–10.5)
nRBC: 0 % (ref 0.0–0.2)

## 2021-05-29 MED ORDER — SODIUM CHLORIDE (PF) 0.9 % IJ SOLN
INTRAMUSCULAR | Status: AC
  Filled 2021-05-29: qty 50

## 2021-05-29 MED ORDER — IOHEXOL 300 MG/ML  SOLN
75.0000 mL | Freq: Once | INTRAMUSCULAR | Status: AC | PRN
  Administered 2021-05-29: 75 mL via INTRAVENOUS

## 2021-05-31 DIAGNOSIS — H6983 Other specified disorders of Eustachian tube, bilateral: Secondary | ICD-10-CM | POA: Diagnosis not present

## 2021-05-31 DIAGNOSIS — H903 Sensorineural hearing loss, bilateral: Secondary | ICD-10-CM | POA: Diagnosis not present

## 2021-06-01 ENCOUNTER — Inpatient Hospital Stay (HOSPITAL_BASED_OUTPATIENT_CLINIC_OR_DEPARTMENT_OTHER): Payer: Medicare HMO | Admitting: Internal Medicine

## 2021-06-01 ENCOUNTER — Other Ambulatory Visit: Payer: Self-pay

## 2021-06-01 VITALS — BP 101/64 | HR 65 | Temp 98.1°F | Resp 18 | Wt 163.2 lb

## 2021-06-01 DIAGNOSIS — D61818 Other pancytopenia: Secondary | ICD-10-CM | POA: Diagnosis not present

## 2021-06-01 DIAGNOSIS — Z7901 Long term (current) use of anticoagulants: Secondary | ICD-10-CM | POA: Diagnosis not present

## 2021-06-01 DIAGNOSIS — C3490 Malignant neoplasm of unspecified part of unspecified bronchus or lung: Secondary | ICD-10-CM | POA: Diagnosis not present

## 2021-06-01 DIAGNOSIS — C3432 Malignant neoplasm of lower lobe, left bronchus or lung: Secondary | ICD-10-CM | POA: Diagnosis not present

## 2021-06-01 DIAGNOSIS — I10 Essential (primary) hypertension: Secondary | ICD-10-CM | POA: Diagnosis not present

## 2021-06-01 DIAGNOSIS — Z79899 Other long term (current) drug therapy: Secondary | ICD-10-CM | POA: Diagnosis not present

## 2021-06-01 NOTE — Progress Notes (Signed)
[place] [phone]   [phone]  OFFICE PROGRESS NOTE  [name], MD [place] [address]  DIAGNOSIS:  Limited stage (T2a, N2, M0) small cell lung cancer presented with left lower lobe lung mass in addition to left infrahilar and subcarinal lymphadenopathy diagnosed in October 2022.   PRIOR THERAPY:  1) Systemic chemotherapy with cisplatin 80 mg/m2  on days 1 and etoposide 100 mg/m2 on days 1, 2, and 3 IV every 3 weeks. First dose 10/31/20.  Status post 4 cycles.  Her dose of cisplatin was reduced to 60 Mg/M2 from cycle #3 and starting cycle #4 of etoposide was also reduced to 80 Mg/M2 secondary to intolerance and pancytopenia. 2) prophylactic cranial irradiation under the care of of Dr. Lisbeth Renshaw   CURRENT THERAPY: Observation.  INTERVAL HISTORY: Patricia [name] 71 y.o. female returns to the clinic today for 3 months follow-up visit accompanied by her husband.  The patient is feeling fine today with no concerning complaints except for lack of energy and the baseline shortness of breath secondary to COPD and she is currently on home oxygen.  She denied having any chest pain, cough or hemoptysis.  She denied having any fever or chills.  She has no nausea, vomiting, diarrhea or constipation.  She has no headache or visual changes.  She is currently on observation and doing fine. She is here today for evaluation with repeat CT scan of the chest for restaging of her disease.  MEDICAL HISTORY: Past Medical History:  Diagnosis Date   Anemia    Anxiety    GERD (gastroesophageal reflux disease)    Hyperlipidemia    Hypertension    IBS (irritable bowel syndrome)    Insomnia    Low back pain    Lung cancer ([place]) 10/2020   TIA (transient ischemic attack) 10/2017   no deficits   Vitamin B12 deficiency    Vitamin D deficiency     ALLERGIES:  is allergic to amoxicillin-pot clavulanate, levaquin [levofloxacin in  d5w], magnesium hydroxide, oxycodone, and other.  MEDICATIONS:  Current Outpatient Medications  Medication Sig Dispense Refill   acetaminophen (TYLENOL) 325 MG tablet Take 650 mg by mouth every 6 (six) hours as needed for headache.     albuterol (VENTOLIN HFA) 108 (90 Base) MCG/ACT inhaler Inhale 2 puffs into the lungs every 6 (six) hours as needed for shortness of breath.     amiodarone (PACERONE) 200 MG tablet Take 1 tablet (200 mg total) by mouth daily. 90 tablet 3   Black Cohosh (REMIFEMIN PO) Take 1 tablet by mouth 2 (two) times daily.     carboxymethylcellulose (REFRESH PLUS) 0.5 % SOLN Place 1 drop into both eyes 3 (three) times daily as needed (for dryness).      carisoprodol (SOMA) 350 MG tablet Take 350 mg by mouth 3 (three) times daily.   5   Cholecalciferol (VITAMIN D3 PO) Take 2 capsules by mouth daily.     clobetasol ointment (TEMOVATE) 7.49 % Apply 1 application. topically daily as needed (vaginal irritation).     clonazePAM (KLONOPIN) 1 MG tablet Take 1 mg by mouth at bedtime.   3   Coenzyme Q10 200 MG capsule Take 400 mg by mouth daily.     Cyanocobalamin (B-12 PO) Take 2 capsules by mouth daily.     docusate sodium (COLACE) 100 MG capsule Take 1 capsule (100 mg total) by mouth 2 (two) times daily. (Patient  taking differently: Take 100 mg by mouth 2 (two) times daily as needed for moderate constipation.) 10 capsule 0   ELIQUIS 5 MG TABS tablet TAKE 1 TABLET(5 MG) BY MOUTH TWICE DAILY 60 tablet 6   escitalopram (LEXAPRO) 10 MG tablet Take 1 tablet (10 mg total) by mouth daily. 30 tablet 2   fluticasone (FLONASE) 50 MCG/ACT nasal spray Place 2 sprays into both nostrils daily.  5   furosemide (LASIX) 20 MG tablet Take 1 tablet (20 mg total) by mouth daily. Take only if weight goes above 180 lbs. (Patient taking differently: Take 20 mg by mouth daily as needed (weight gain of 3 - 4 lbs overnight).) 30 tablet 2   gabapentin (NEURONTIN) 600 MG tablet Take 600 mg by mouth 2 (two) times  daily. in the morning and at lunchtime     guaiFENesin-dextromethorphan (ROBITUSSIN DM) 100-10 MG/5ML syrup Take 10 mLs by mouth every 4 (four) hours as needed for cough. 118 mL 0   hydrOXYzine (ATARAX) 25 MG tablet Take 25 mg by mouth at bedtime as needed for sleep.     ipratropium (ATROVENT) 0.02 % nebulizer solution Take 0.5 mg by nebulization 3 (three) times daily. As directed     levalbuterol (XOPENEX) 1.25 MG/3ML nebulizer solution Inhale 1.25 mg into the lungs 3 (three) times daily.     lidocaine (XYLOCAINE) 2 % solution Use as directed 15 mLs in the mouth or throat every 6 (six) hours as needed for mouth pain. 100 mL 0   linaclotide (LINZESS) 145 MCG CAPS capsule Take 1 capsule (145 mcg total) by mouth daily at 4 PM. 30 capsule 0   magic mouthwash SOLN Take 5 mLs by mouth 4 (four) times daily as needed for mouth pain. Pt is allergic to Magnesium 240 mL 1   magnesium oxide (MAG-OX) 400 (240 Mg) MG tablet Take 1 tablet (400 mg total) by mouth daily. 30 tablet 1   memantine (NAMENDA) 10 MG tablet Take 1 tablet (10 mg total) by mouth 2 (two) times daily. 60 tablet 4   memantine (NAMENDA) 5 MG tablet Begin this prescription the first day of brain radiation. Week 1: take one tablet po qam. Week 2: take one tablet qam and qpm. Week 3: take two tablets qam, and one tablet po q pm. Week 4: take two tablets qam and qpm. Fill subsequent prescription q month. (Patient taking differently: Take 10 mg by mouth 2 (two) times daily.) 70 tablet 0   metoprolol succinate (TOPROL-XL) 50 MG 24 hr tablet Take 1 tablet (50 mg total) by mouth at bedtime. Take with or immediately following a meal. 30 tablet 6   nystatin (MYCOSTATIN) 100000 UNIT/ML suspension Take 5 mLs (500,000 Units total) by mouth 4 (four) times daily. 440 mL 5   omeprazole (PRILOSEC) 20 MG capsule Take 20 mg by mouth daily before breakfast.      ondansetron (ZOFRAN-ODT) 8 MG disintegrating tablet Take 8 mg by mouth daily as needed for nausea.      polyethylene glycol (MIRALAX / GLYCOLAX) packet Take 17 g by mouth in the morning.     prochlorperazine (COMPAZINE) 10 MG tablet Take 1 tablet (10 mg total) by mouth every 6 (six) hours as needed for nausea or vomiting. 30 tablet 0   rosuvastatin (CRESTOR) 5 MG tablet Take 5 mg by mouth every Monday, Wednesday, and Friday.     traMADol (ULTRAM) 50 MG tablet Take 50 mg by mouth in the morning, at noon, and at bedtime.  zaleplon (SONATA) 10 MG capsule Take 10 mg by mouth at bedtime as needed (for interrupted sleep).      No current facility-administered medications for this visit.    SURGICAL HISTORY:  Past Surgical History:  Procedure Laterality Date   BACK SURGERY     BREAST EXCISIONAL BIOPSY Left 1997   benign   BRONCHIAL BIOPSY  10/18/2020   Procedure: BRONCHIAL BIOPSIES;  Surgeon: [name], DO;  Location: [facility];  Service: Pulmonary;;   BRONCHIAL BRUSHINGS  10/18/2020   Procedure: BRONCHIAL BRUSHINGS;  Surgeon: [name], DO;  Location: [facility];  Service: Pulmonary;;   BRONCHIAL NEEDLE ASPIRATION BIOPSY  10/18/2020   Procedure: BRONCHIAL NEEDLE ASPIRATION BIOPSIES;  Surgeon: [name], DO;  Location: [facility];  Service: Pulmonary;;   BRONCHIAL WASHINGS  10/18/2020   Procedure: BRONCHIAL WASHINGS;  Surgeon: [name], DO;  Location: [facility];  Service: Pulmonary;;   CARDIOVERSION N/A 02/13/2021   Procedure: CARDIOVERSION;  Surgeon: Werner Lean, MD;  Location: [facility];  Service: Cardiovascular;  Laterality: N/A;   CARDIOVERSION N/A 02/20/2021   Procedure: CARDIOVERSION;  Surgeon: Skeet Latch, MD;  Location: [facility];  Service: Cardiovascular;  Laterality: N/A;   CARDIOVERSION N/A 03/22/2021   Procedure: CARDIOVERSION;  Surgeon: Freada Bergeron, MD;  [facility];  Service: Cardiovascular;  Laterality: N/A;   hemorrhoidecotmy     HERNIA MESH REMOVAL     OPEN REDUCTION INTERNAL FIXATION (ORIF)  DISTAL RADIAL FRACTURE Right 11/19/2019   Procedure: OPEN REDUCTION INTERNAL FIXATION (ORIF) DISTAL RADIUS AND ULNA FRACTURE WITH REPAIR AS NECESSARY;  Surgeon: Roseanne Kaufman, MD;  Location: [place];  Service: Orthopedics;  Laterality: Right;  2 hrs Block with IV sedation   ORIF RADIUS & ULNA FRACTURES     TONSILLECTOMY     VIDEO BRONCHOSCOPY WITH ENDOBRONCHIAL NAVIGATION Left 10/18/2020   Procedure: VIDEO BRONCHOSCOPY WITH ENDOBRONCHIAL NAVIGATION;  Surgeon: [name], DO;  Location: [place];  Service: Pulmonary;  Laterality: Left;  ION   VIDEO BRONCHOSCOPY WITH ENDOBRONCHIAL ULTRASOUND Bilateral 10/18/2020   Procedure: VIDEO BRONCHOSCOPY WITH ENDOBRONCHIAL ULTRASOUND;  Surgeon: [name], DO;  Location: [place] [facility];  Service: Pulmonary;  Laterality: Bilateral;   VIDEO BRONCHOSCOPY WITH RADIAL ENDOBRONCHIAL ULTRASOUND  10/18/2020   Procedure: VIDEO BRONCHOSCOPY WITH RADIAL ENDOBRONCHIAL ULTRASOUND;  Surgeon: [name], DO;  Location: [facility];  Service: Pulmonary;;    REVIEW OF SYSTEMS:  A comprehensive review of systems was negative except for: Constitutional: positive for fatigue Respiratory: positive for dyspnea on exertion Musculoskeletal: positive for muscle weakness   PHYSICAL EXAMINATION: General appearance: alert, cooperative, fatigued, and no distress Head: Normocephalic, without obvious abnormality, atraumatic Neck: no adenopathy, no JVD, supple, symmetrical, trachea midline, and thyroid not enlarged, symmetric, no tenderness/mass/nodules Lymph nodes: Cervical, supraclavicular, and axillary nodes normal. Resp: clear to auscultation bilaterally Back: symmetric, no curvature. ROM normal. No CVA tenderness. Cardio: regular rate and rhythm, S1, S2 normal, no murmur, click, rub or gallop GI: soft, non-tender; bowel sounds normal; no masses,  no organomegaly Extremities: extremities normal, atraumatic, no cyanosis or edema  ECOG PERFORMANCE STATUS: 1 -  Symptomatic but completely ambulatory  Blood pressure 101/64, pulse 65, temperature 98.1 F (36.7 C), temperature source Tympanic, resp. rate 18, weight 163 lb 4 oz (74 kg), SpO2 94 %.  LABORATORY DATA: Lab Results  Component Value Date   WBC 4.7 05/29/2021   HGB 11.5 (L) 05/29/2021   HCT 32.5 (L) 05/29/2021   MCV 109.4 (H) 05/29/2021  PLT 126 (L) 05/29/2021      Chemistry      Component Value Date/Time   NA 130 (L) 05/29/2021 1520   NA 133 (L) 03/17/2021 1152   K 4.2 05/29/2021 1520   CL 90 (L) 05/29/2021 1520   CO2 33 (H) 05/29/2021 1520   BUN 10 05/29/2021 1520   BUN 13 03/17/2021 1152   CREATININE 0.94 05/29/2021 1520      Component Value Date/Time   CALCIUM 9.1 05/29/2021 1520   ALKPHOS 66 05/29/2021 1520   AST 21 05/29/2021 1520   ALT 20 05/29/2021 1520   BILITOT 0.4 05/29/2021 1520       RADIOGRAPHIC STUDIES: CT Chest W Contrast  Result Date: 05/30/2021 CLINICAL DATA:  Small cell lung cancer restaging. Prior radiation therapy to the left lower lobe, most recently 01/13/2021 based on review of the medical records. * Tracking Code: BO * EXAM: CT CHEST WITH CONTRAST TECHNIQUE: Multidetector CT imaging of the chest was performed during intravenous contrast administration. RADIATION DOSE REDUCTION: This exam was performed according to the departmental dose-optimization program which includes automated exposure control, adjustment of the mA and/or kV according to patient size and/or use of iterative reconstruction technique. CONTRAST:  80mL OMNIPAQUE IOHEXOL 300 MG/ML  SOLN COMPARISON:  Multiple exams, including 02/24/2021 FINDINGS: Cardiovascular: Coronary, aortic arch, and branch vessel atherosclerotic vascular disease. Mediastinum/Nodes: Stable prominence of epicardial adipose tissue. Right hilar node 0.8 cm in short axis on image 73 series 2, formerly 1.0 cm. Lungs/Pleura: Centrilobular emphysema. Small amount of mucus in the right mainstem bronchus. Moderate left  pleural effusion, mildly reduced from previous. Progressive bandlike density in the left upper lobe anteriorly and extending in the infrahilar region in the left lower lobe, with worsening volume loss and airspace opacity in the left lower lobe although in a configuration potentially from radiation therapy. Pneumonia not excluded but less likely. Upper Abdomen: Abdominal aortic atherosclerosis. Musculoskeletal: Prior L2 compression fracture with vertebral augmentation. IMPRESSION: 1. Left infrahilar bandlike airspace opacity in the lingula and left lower lobe, probably from radiation therapy given the configuration. The opacity is increased from previous. Superimposed pneumonia not excluded. I do not see obvious focal tumor. 2. Moderate but improved left pleural effusion. 3. Other imaging findings of potential clinical significance: Aortic Atherosclerosis (ICD10-I70.0) and Emphysema (ICD10-J43.9). Coronary atherosclerosis. Prior L2 compression fracture with vertebral augmentation. Electronically Signed   By: Van Clines M.D.   On: 05/30/2021 16:54    ASSESSMENT AND PLAN: This is a very pleasant 71 years old white female recently diagnosed with limited stage (T2 a, N2, M0) small cell lung cancer presented with left lower lobe lung mass in addition to left hilar and subcarinal lymphadenopathy in October 2022.  The patient completed a course of systemic chemotherapy with cisplatin and etoposide status post 4 cycles concurrent with radiation. Starting from cycle #4 her dose of cisplatin will be reduced to 60 Mg/M2 on day 1 and 2 etoposide 80 Mg/M2 on days 1, 2 and 3. The patient has a rough time with this treatment with significant fatigue and weakness as well as pancytopenia. She also underwent prophylactic cranial irradiation under the care of Dr. Lisbeth Renshaw. She is currently on observation but continues to complain of the baseline fatigue and shortness of breath with exertion and she is currently on home  oxygen. She had repeat CT scan of the chest performed recently.  I personally and independently reviewed the scan images and discussed the results with the patient and her husband. Her  scan showed no concerning findings for disease progression. I recommended for her to continue on observation with repeat CT scan of the chest in 3 months. The patient is scheduled to have MRI of the brain by Dr. Lisbeth Renshaw next months. She was advised to call immediately if she has any other concerning symptoms in the interval. The patient voices understanding of current disease status and treatment options and is in agreement with the current care plan.  All questions were answered. The patient knows to call the clinic with any problems, questions or concerns. We can certainly see the patient much sooner if necessary. The total time spent in the appointment was 30 minutes.  Disclaimer: This note was dictated with voice recognition software. Similar sounding words can inadvertently be transcribed and may not be corrected upon review.

## 2021-06-03 DIAGNOSIS — I4891 Unspecified atrial fibrillation: Secondary | ICD-10-CM | POA: Diagnosis not present

## 2021-06-11 ENCOUNTER — Other Ambulatory Visit: Payer: Self-pay | Admitting: Cardiology

## 2021-06-12 NOTE — Telephone Encounter (Signed)
Prescription refill request for Eliquis received. Indication: PAF Last office visit: 04/24/21  Elliot Cousin MD Scr: 0.94 on 05/29/21 Age: 71 Weight: 75.6kg  Based on above findings Eliquis 5mg  twice daily is the appropriate dose.  Refill approved.

## 2021-06-13 DIAGNOSIS — F324 Major depressive disorder, single episode, in partial remission: Secondary | ICD-10-CM | POA: Diagnosis not present

## 2021-06-13 DIAGNOSIS — E7800 Pure hypercholesterolemia, unspecified: Secondary | ICD-10-CM | POA: Diagnosis not present

## 2021-06-13 DIAGNOSIS — I5033 Acute on chronic diastolic (congestive) heart failure: Secondary | ICD-10-CM | POA: Diagnosis not present

## 2021-06-13 DIAGNOSIS — I10 Essential (primary) hypertension: Secondary | ICD-10-CM | POA: Diagnosis not present

## 2021-06-19 NOTE — Progress Notes (Signed)
Cardiology Office Note:    Date:  06/29/2021   ID:  Patricia [name], DOB 03/12/1950, MRN [id number]  PCP:  [name], MD  Cardiologist:  Kirk Ruths, MD  Electrophysiologist:  Constance Haw, MD   Referring MD: [name], MD   Chief Complaint: follow-up of CHF and atrial flutter  History of Present Illness:    Patricia [name] is a 71 y.o. 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 Stanford Breed and [facility] and presents today for follow-up of CHF and atrial flutter.  Patient was referred to Dr. Curt Bears in 06/2018 for evaluation of SVT.  Monitor around that time showed SVT but no atrial fibrillation.  However repeat monitor in 12/2019 both SVT and atrial fibrillation.  She was started on Eliquis.  Has had multiple admissions over the last several months.  Who had multiple ED visits in November/December 2022 for atrial fibrillation/flutter.  Flecainide was started. She was admitted in 12/2020 with acute hypoxic respiratory failure in the setting of COPD exacerbation and atrial fibrillation with RVR.  Eliquis had to be held due to worsening pancytopenia; therefore, Flecainide was stopped as well.  She was started on Cardizem and Metoprolol instead. Eliquis was ultimately able to be restarted prior to discharge per oncology.     She was readmitted in 02/2021 current atrial flutter with RVR and acute on chronic hypoxic respiratory failure secondary to acute diastolic CHF after presenting with acute severe dyspnea woke her up from sleep.  She was started on IV Amiodarone and then underwent DCCV on 02/13/2021 but unfortunately went back into atrial flutter after this.  Therefore, Amiodarone load was continued and she was started on Digoxin and then underwent repeat DCCV on 02/20/2021 with restoration of sinus rhythm.  She was also diuresed with IV Lasix.  Echo during admission showed LVEF of 45-50% with global hypokinesis and mildly enlarged RV with mild late elevated PASP of 40.2 mmHg as well as a moderate pleural effusion on the left. EF down from 60-65% from 09/2020 (felt to be due to rapid atrial flutter). She was discharged on an Amiodarone taper, Digoxin, and PO Lasix which she was instructed to take only if her weight got above 180 pounds. Digoxin was later stopped due to elevated Digoxin level.   Patient was seen by me on 03/21/2021 at which she was back in atrial flutter with rates in the 505L and systolic BP in the high 97Q. She was symptomatic with this with lightheadedness/dizziness, weakness, and fatigue. Amiodarone was increased to 200mg  twice daily and close outpatient cardioversion was arranged. She underwent successful DCCV on 03/22/2021 with restoration of sinus rhythm.  Patient was last seen by Dr. Curt Bears on 04/24/2021 at which time she was doing well from a cardiac standpoint. Her Amiodarone was decreased back down to 200mg  daily and she was switched from Lopressor to Toprol-XL given persistent fatigue.   Patient presents today for follow-up. Patient is doing well since last visit. She denies any problems with recurrent atrial fibrillation/flutter. She has occasional dizziness/lightheadedness if she stands up too quickly but no palpitations or syncope. She denies any chest pain, shortness of breath, orthopnea, PND, or lower extremity edema. Her BP has been running soft with systolic BP in  the 90s and so her Nephrologist recommended decreasing her Toprol. This was decreased to 25mg  daily and she is tolerating this well.  Of note, patient states she has finished chemo and radiation for her cancer she states recent scans were "clear."  Past Medical History:   Diagnosis Date   Anemia    Anxiety    GERD (gastroesophageal reflux disease)    Hyperlipidemia    Hypertension    IBS (irritable bowel syndrome)    Insomnia    Low back pain    Lung cancer ([place]) 10/2020   TIA (transient ischemic attack) 10/2017   no deficits   Vitamin B12 deficiency    Vitamin D deficiency     Past Surgical History:  Procedure Laterality Date   BACK SURGERY     BREAST EXCISIONAL BIOPSY Left 1997   benign   BRONCHIAL BIOPSY  10/18/2020   Procedure: BRONCHIAL BIOPSIES;  Surgeon: [name], DO;  Location: [place] [facility];  Service: Pulmonary;;   BRONCHIAL BRUSHINGS  10/18/2020   Procedure: BRONCHIAL BRUSHINGS;  Surgeon: [name], DO;  Location: [place];  Service: Pulmonary;;   BRONCHIAL NEEDLE ASPIRATION BIOPSY  10/18/2020   Procedure: BRONCHIAL NEEDLE ASPIRATION BIOPSIES;  Surgeon: [name], DO;  Location: [place];  Service: Pulmonary;;   BRONCHIAL WASHINGS  10/18/2020   Procedure: BRONCHIAL WASHINGS;  Surgeon: [name], DO;  Location: [place];  Service: Pulmonary;;   CARDIOVERSION N/A 02/13/2021   Procedure: CARDIOVERSION;  Surgeon: Werner Lean, MD;  Location: [place];  Service: Cardiovascular;  Laterality: N/A;   CARDIOVERSION N/A 02/20/2021   Procedure: CARDIOVERSION;  Surgeon: Skeet Latch, MD;  Location: [place];  Service: Cardiovascular;  Laterality: N/A;   CARDIOVERSION N/A 03/22/2021   Procedure: CARDIOVERSION;  Surgeon: Freada Bergeron, MD;  Location: [facility];  Service: Cardiovascular;  Laterality: N/A;   hemorrhoidecotmy     HERNIA MESH REMOVAL     OPEN REDUCTION INTERNAL FIXATION (ORIF) DISTAL RADIAL FRACTURE Right 11/19/2019   Procedure: OPEN REDUCTION INTERNAL FIXATION (ORIF) DISTAL RADIUS AND ULNA FRACTURE WITH REPAIR AS NECESSARY;  Surgeon: Roseanne Kaufman, MD;  Location: [place];  Service: Orthopedics;  Laterality: Right;  2 hrs Block with IV sedation   ORIF RADIUS & ULNA  FRACTURES     TONSILLECTOMY     VIDEO BRONCHOSCOPY WITH ENDOBRONCHIAL NAVIGATION Left 10/18/2020   Procedure: VIDEO BRONCHOSCOPY WITH ENDOBRONCHIAL NAVIGATION;  Surgeon: [name], DO;  Location: [place];  Service: Pulmonary;  Laterality: Left;  ION   VIDEO BRONCHOSCOPY WITH ENDOBRONCHIAL ULTRASOUND Bilateral 10/18/2020   Procedure: VIDEO BRONCHOSCOPY WITH ENDOBRONCHIAL ULTRASOUND;  Surgeon: [name], DO;  Location: [place];  Service: Pulmonary;  Laterality: Bilateral;   VIDEO BRONCHOSCOPY WITH RADIAL ENDOBRONCHIAL ULTRASOUND  10/18/2020   Procedure: VIDEO BRONCHOSCOPY WITH RADIAL ENDOBRONCHIAL ULTRASOUND;  Surgeon: [name], DO;  Location: [place] [facility];  Service: Pulmonary;;    Current Medications: Current Meds  Medication Sig   acetaminophen (TYLENOL) 325 MG tablet Take 650 mg by mouth every 6 (six) hours as needed for headache.   albuterol (VENTOLIN HFA) 108 (90 Base) MCG/ACT inhaler Inhale 2 puffs into the lungs every 6 (six) hours as needed for shortness of breath.   amiodarone (PACERONE) 200 MG tablet Take 1 tablet (200 mg total) by mouth daily.   apixaban (ELIQUIS) 5 MG TABS tablet TAKE 1 TABLET(5 MG) BY MOUTH TWICE DAILY   Black Cohosh (REMIFEMIN PO) Take 1 tablet by mouth 2 (two) times daily.  carboxymethylcellulose (REFRESH PLUS) 0.5 % SOLN Place 1 drop into both eyes 3 (three) times daily as needed (for dryness).    carisoprodol (SOMA) 350 MG tablet Take 350 mg by mouth 3 (three) times daily.    Cholecalciferol (VITAMIN D3 PO) Take 2 capsules by mouth daily.   clobetasol ointment (TEMOVATE) 1.06 % Apply 1 application. topically daily as needed (vaginal irritation).   clonazePAM (KLONOPIN) 1 MG tablet Take 1 mg by mouth at bedtime.    Coenzyme Q10 200 MG capsule Take 400 mg by mouth daily.   Cyanocobalamin (B-12 PO) Take 2 capsules by mouth daily.   docusate sodium (COLACE) 100 MG capsule Take 1 capsule (100 mg total) by mouth 2 (two) times daily.  (Patient taking differently: Take 100 mg by mouth 2 (two) times daily as needed for moderate constipation.)   escitalopram (LEXAPRO) 10 MG tablet Take 1 tablet (10 mg total) by mouth daily.   fluticasone (FLONASE) 50 MCG/ACT nasal spray Place 2 sprays into both nostrils daily.   gabapentin (NEURONTIN) 600 MG tablet Take 600 mg by mouth 2 (two) times daily. in the morning and at lunchtime   guaiFENesin-dextromethorphan (ROBITUSSIN DM) 100-10 MG/5ML syrup Take 10 mLs by mouth every 4 (four) hours as needed for cough.   hydrOXYzine (ATARAX) 25 MG tablet Take 25 mg by mouth at bedtime as needed for sleep.   ipratropium (ATROVENT) 0.02 % nebulizer solution Take 0.5 mg by nebulization 3 (three) times daily. As directed   levalbuterol (XOPENEX) 1.25 MG/3ML nebulizer solution Inhale 1.25 mg into the lungs 3 (three) times daily.   lidocaine (XYLOCAINE) 2 % solution Use as directed 15 mLs in the mouth or throat every 6 (six) hours as needed for mouth pain.   linaclotide (LINZESS) 145 MCG CAPS capsule Take 1 capsule (145 mcg total) by mouth daily at 4 PM.   magic mouthwash SOLN Take 5 mLs by mouth 4 (four) times daily as needed for mouth pain. Pt is allergic to Magnesium   magnesium oxide (MAG-OX) 400 (240 Mg) MG tablet Take 1 tablet (400 mg total) by mouth daily.   memantine (NAMENDA) 10 MG tablet Take 1 tablet (10 mg total) by mouth 2 (two) times daily.   memantine (NAMENDA) 5 MG tablet Begin this prescription the first day of brain radiation. Week 1: take one tablet po qam. Week 2: take one tablet qam and qpm. Week 3: take two tablets qam, and one tablet po q pm. Week 4: take two tablets qam and qpm. Fill subsequent prescription q month. (Patient taking differently: Take 10 mg by mouth 2 (two) times daily.)   metoprolol succinate (TOPROL-XL) 25 MG 24 hr tablet Take 25 mg by mouth daily.   nystatin (MYCOSTATIN) 100000 UNIT/ML suspension Take 5 mLs (500,000 Units total) by mouth 4 (four) times daily.    omeprazole (PRILOSEC) 20 MG capsule Take 20 mg by mouth daily before breakfast.    ondansetron (ZOFRAN-ODT) 8 MG disintegrating tablet Take 8 mg by mouth daily as needed for nausea.   polyethylene glycol (MIRALAX / GLYCOLAX) packet Take 17 g by mouth in the morning.   prochlorperazine (COMPAZINE) 10 MG tablet Take 1 tablet (10 mg total) by mouth every 6 (six) hours as needed for nausea or vomiting.   rosuvastatin (CRESTOR) 5 MG tablet Take 5 mg by mouth daily. Take 1 Tablet Daily   sodium chloride 1 g tablet Take 2 g by mouth daily.   traMADol (ULTRAM) 50 MG tablet Take 50 mg by  mouth in the morning, at noon, and at bedtime.   zaleplon (SONATA) 10 MG capsule Take 10 mg by mouth at bedtime as needed (for interrupted sleep).    [DISCONTINUED] metoprolol succinate (TOPROL-XL) 50 MG 24 hr tablet Take 1 tablet (50 mg total) by mouth at bedtime. Take with or immediately following a meal. (Patient taking differently: Take 25 mg by mouth at bedtime. Take with or immediately following a meal.)     Allergies:   Amoxicillin-pot clavulanate, Levaquin [levofloxacin in d5w], Magnesium hydroxide, Oxycodone, and Other   Social History   Socioeconomic History   Marital status: Married    Spouse name: Not on file   Number of children: Not on file   Years of education: Not on file   Highest education level: Not on file  Occupational History   Not on file  Tobacco Use   Smoking status: Former    Packs/day: 1.00    Years: 50.00    Total pack years: 50.00    Types: Cigarettes    Start date: 82    Quit date: 12/14/2019    Years since quitting: 1.5   Smokeless tobacco: Never  Vaping Use   Vaping Use: Never used  Substance and Sexual Activity   Alcohol use: Not Currently    Comment: occ once every 2 months   Drug use: Never   Sexual activity: Not on file  Other Topics Concern   Not on file  Social History Narrative   Live with husband.  Education HS.  Retired.  Children 2 (daughter). 1 cup coffee.      Social Determinants of Health   Financial Resource Strain: Low Risk  (11/09/2020)   Overall Financial Resource Strain (CARDIA)    Difficulty of Paying Living Expenses: Not hard at all  Food Insecurity: No Food Insecurity (11/09/2020)   Hunger Vital Sign    Worried About Running Out of Food in the Last Year: Never true    Ran Out of Food in the Last Year: Never true  Transportation Needs: No Transportation Needs (11/09/2020)   PRAPARE - Hydrologist (Medical): No    Lack of Transportation (Non-Medical): No  Physical Activity: Not on file  Stress: No Stress Concern Present (11/09/2020)   [place]    Feeling of Stress : Only a little  Social Connections: Socially Integrated (11/09/2020)   Social Connection and Isolation Panel [NHANES]    Frequency of Communication with Friends and Family: More than three times a week    Frequency of Social Gatherings with Friends and Family: More than three times a week    Attends Religious Services: More than 4 times per year    Active Member of [employer] or Organizations: Yes    Attends Music therapist: More than 4 times per year    Marital Status: Married     Family History: The patient's family history includes Breast cancer in her maternal aunt, paternal aunt, and sister.  ROS:   Please see the history of present illness.     EKGs/Labs/Other Studies Reviewed:    The following studies were reviewed:  Event Monitor 05/14/2018 to 06/12/2018: Zero atrial fibrillation Sinus rhythm with episodes of paroxysmal SVT _______________  [facility] Monitor 12/08/2019 to 12/22/2019: Less than 1% ventricular and supraventricular ectopy The predominant underlying rhythm was sinus rhythm 2% atrial fibrillation burden with heart rates of 107 to 234 bpm Triggered events associated with both atrial  fibrillation and SVT. _______________  Carotid Ultrasound  10/01/2020: Summary: - Right Carotid: Velocities in the right ICA are consistent with a 1-39%  stenosis.  - Left Carotid: Velocities in the left ICA are consistent with a 1-39%  stenosis.  - Vertebrals: Bilateral vertebral arteries demonstrate antegrade flow.  - Subclavians: Normal flow hemodynamics were seen in bilateral subclavian arteries.  _______________   Echocardiogram 02/13/2021: Impressions:  1. Left ventricular ejection fraction, by estimation, is 45 to 50%. The  left ventricle has mildly decreased function. The left ventricle  demonstrates global hypokinesis.   2. The right ventricular size is mildly enlarged. There is mildly  elevated pulmonary artery systolic pressure. The estimated right  ventricular systolic pressure is 61.6 mmHg.   3. Right atrial size was mildly dilated.   4. Moderate pleural effusion in the left lateral region.   5. Tricuspid valve regurgitation is moderate.   6. The inferior vena cava is dilated in size with <50% respiratory  variability, suggesting right atrial pressure of 15 mmHg.   Comparison(s): Prior images reviewed side by side. The left ventricular  function is worsened.  EKG:  EKG not ordered today.   Recent Labs: 03/06/2021: B Natriuretic Peptide 283.4 03/21/2021: Magnesium 2.0; TSH 1.910 05/29/2021: ALT 20; BUN 10; Creatinine 0.94; Hemoglobin 11.5; Platelet Count 126; Potassium 4.2; Sodium 130  Recent Lipid Panel No results found for: "CHOL", "TRIG", "HDL", "CHOLHDL", "VLDL", "LDLCALC", "LDLDIRECT"  Physical Exam:    Vital Signs: BP 101/61 (BP Location: Left Arm)   Pulse 71   Ht 5\' 7"  (1.702 m)   Wt 164 lb 6.4 oz (74.6 kg)   LMP  (LMP Unknown)   SpO2 93%   BMI 25.75 kg/m     Wt Readings from Last 3 Encounters:  06/29/21 164 lb 6.4 oz (74.6 kg)  06/01/21 163 lb 4 oz (74 kg)  04/24/21 166 lb 9.6 oz (75.6 kg)     General: 71 y.o. Caucasian female in no acute distress. HEENT: Normocephalic and atraumatic. Sclera clear.  Neck:  Supple. No JVD. Heart: RRR. Distinct S1 and S2. No murmurs, gallops, or rubs.  Lungs: No increased work of breathing. Clear to ausculation bilaterally. No wheezes, rhonchi, or rales.  Abdomen: Soft, non-distended, and non-tender to palpation.  Extremities: No lower extremity edema.    Skin: Warm and dry. Neuro: Alert and oriented x3. No focal deficits. Psych: Normal affect. Responds appropriately.  Assessment:    1. Paroxysmal atrial flutter (HCC)   2. Paroxysmal atrial fibrillation (HCC)   3. Paroxysmal SVT (supraventricular tachycardia) (HCC)   4. HFrEF (heart failure with reduced ejection fraction) ([place])   5. Chronic respiratory failure with hypoxia (HCC)   6. Primary hypertension   7. Hyperlipidemia, unspecified hyperlipidemia type   8. Malignant neoplasm of lung, unspecified laterality, unspecified part of lung ([place])     Plan:    Paroxysmal Atrial Flutter Paroxysmal Atrial Fibrillation Paroxysmal SVT S/p DCCV in 02/2021 and again in 03/2021. - Maintaining sinus rhythm on exam today.  - Continue Toprol-XL 25mg  daily. - Continue Amiodarone 200mg  daily.  - Continue Eliquis 5mg  twice daily.   Chronic CHF with Mildly Reduced EF Echo showed LVEF of 45-50% with global hypokinesis and mildly enlarged RV with mild late elevated PASP of 40.2 mmHg as well as a moderate pleural effusion on the left. EF was down from 60-65% in 09/2020 and this was felt to likely be due to tachyarrhythmia.  - Euvolemic on exam.  - Continue Lasix 20mg  daily as  needed if weight >180 lbs. - GDMT limited by hypotension. Only on Toprol-XL 50mg  twice daily.  - Continue daily weights and sodium/fluid restrictions. - Offered to repeat Echo to reassess LV function now that she is back in sinus rhythm but patient wanted to hold off on this. I think this is very reasonable given EF was just slightly down and she is asymptomatic.  Chronic Hypoxic Respiratory Failure Patient has 2L of O2 that she wears all the time  at home since O2 sats were noted to drop to the 80s during previous hospitalization. She is not wearing her home O2 today and O2 sats 93%.  - Discussed that she may only need her home O2 as needed now. She has a pulse oximeter at home. Recommended monitoring O2 sat at home while ambulating. Goal is to maintain O2 stat >90%.   Hypertension History of hypertension but has had more low BP lately. BP soft but stable. - Continue Toprol-XL 20mg  daily.    Hyperlipidemia Last lipid panel in 04/2020: Total Cholesterol 211, Triglycerides 74, HDL 71, LDL 127. - Currently on Crestor 5mg  M/W/F. She states she had muscle aches when on this daily but this was a long time ago. She is willing to retry it. Therefore, will increase Crestor to 5mg  daily. - Will repeat lipid panel and LFTs in 2-3 months.   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 She has completed chemo and radiation and she states her last scans were "clear." She has last session of radiation to her brain today.  - Followed by Oncology.  Disposition: Follow up in 6 months.   Medication Adjustments/Labs and Tests Ordered: Current medicines are reviewed at length with the patient today.  Concerns regarding medicines are outlined above.  Orders Placed This Encounter  Procedures   Lipid panel   Hepatic function panel   No orders of the defined types were placed in this encounter.   Patient Instructions  Medication Instructions:  Increase Crestor 5 mg ( Take 1 Tablet Daily). *If you need a refill on your cardiac medications before your next appointment, please call your pharmacy*   Lab Work: Lipid Panel, Hepatic Panel. To Be Done In 2 Months. If you have labs (blood work) drawn today and your tests are completely normal, you will receive your results only by: [place] (if you have MyChart) OR A paper copy in the mail If  you have any lab test that is abnormal or we need to change your treatment, we will call you to review the results.   Testing/Procedures: No Testing   Follow-Up: At [facility], you and your health needs are our priority.  As part of our continuing mission to provide you with exceptional heart care, we have created designated Provider Care Teams.  These Care Teams include your primary Cardiologist (physician) and Advanced Practice Providers (APPs -  Physician Assistants and Nurse Practitioners) who all work together to provide you with the care you need, when you need it.  We recommend signing up for the patient portal called "MyChart".  Sign up information is provided on this After Visit Summary.  MyChart is used to connect with patients for Virtual Visits (Telemedicine).  Patients are able to view lab/test results, encounter notes, upcoming appointments, etc.  Non-urgent messages can be sent to your provider as well.   To learn more about what you can do with MyChart,  go to [email or web address].    Your next appointment:   6 month(s)  The format for your next appointment:   In Person  Provider:   Sande Rives, PA-C  or,  Kirk Ruths, MD .      Important Information About Sugar         Signed, Darreld Mclean, PA-C  06/29/2021 12:33 PM    [place]

## 2021-06-21 DIAGNOSIS — D649 Anemia, unspecified: Secondary | ICD-10-CM | POA: Diagnosis not present

## 2021-06-21 DIAGNOSIS — E559 Vitamin D deficiency, unspecified: Secondary | ICD-10-CM | POA: Diagnosis not present

## 2021-06-21 DIAGNOSIS — I959 Hypotension, unspecified: Secondary | ICD-10-CM | POA: Diagnosis not present

## 2021-06-21 DIAGNOSIS — C3432 Malignant neoplasm of lower lobe, left bronchus or lung: Secondary | ICD-10-CM | POA: Diagnosis not present

## 2021-06-21 DIAGNOSIS — E871 Hypo-osmolality and hyponatremia: Secondary | ICD-10-CM | POA: Diagnosis not present

## 2021-06-22 ENCOUNTER — Ambulatory Visit (HOSPITAL_COMMUNITY)
Admission: RE | Admit: 2021-06-22 | Discharge: 2021-06-22 | Disposition: A | Discharge status: Home / Self Care | Payer: Medicare HMO | Source: Ambulatory Visit | Attending: Radiation Oncology | Admitting: Radiation Oncology

## 2021-06-22 DIAGNOSIS — C7931 Secondary malignant neoplasm of brain: Secondary | ICD-10-CM | POA: Insufficient documentation

## 2021-06-22 MED ORDER — GADOBUTROL 1 MMOL/ML IV SOLN
7.0000 mL | Freq: Once | INTRAVENOUS | Status: AC | PRN
  Administered 2021-06-22: 7 mL via INTRAVENOUS

## 2021-06-26 ENCOUNTER — Other Ambulatory Visit: Payer: Self-pay | Admitting: Radiation Therapy

## 2021-06-26 ENCOUNTER — Inpatient Hospital Stay: Payer: Medicare HMO | Attending: Internal Medicine

## 2021-06-26 ENCOUNTER — Encounter: Payer: Self-pay | Admitting: Radiation Oncology

## 2021-06-26 ENCOUNTER — Ambulatory Visit
Admission: RE | Admit: 2021-06-26 | Discharge: 2021-06-26 | Disposition: A | Discharge status: Home / Self Care | Payer: Medicare HMO | Source: Ambulatory Visit | Attending: Radiation Oncology | Admitting: Radiation Oncology

## 2021-06-26 ENCOUNTER — Ambulatory Visit: Payer: Self-pay | Admitting: Radiation Oncology

## 2021-06-26 DIAGNOSIS — C3432 Malignant neoplasm of lower lobe, left bronchus or lung: Secondary | ICD-10-CM

## 2021-06-26 DIAGNOSIS — C3490 Malignant neoplasm of unspecified part of unspecified bronchus or lung: Secondary | ICD-10-CM

## 2021-06-26 NOTE — Progress Notes (Signed)
Radiation Oncology         [phone] ________________________________   Outpatient Follow Up - Conducted via telephone at patient request.  I spoke with the patient to conduct this consult visit via telephone to spare the patient unnecessary potential exposure in the healthcare setting during the current COVID-19 pandemic. The patient was notified in advance and was offered an in person or telemedicine meeting to allow for face to face communication but instead preferred to proceed with a telephone visit.  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   HISTORY OF PRESENT ILLNESS: Patricia [name] is a 71 y.o. 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 She was found to have suspicious findings by CT in the chest. She underwent bronchoscopy with EBUS on 10/18/20 identified malignant cells consistent with small cell carcinoma of the LLL as well as brushings, LLL lavage also positive, and small cell carcinoma at station 7 FNA. A PET scan on 11/04/20 showed hypermetabolic left lower lobe lesion consistent with known disease as well as hypermetabolic left infrahilar and subcarinal adenopathy, no metastatic disease was noted. She started cisplatin/etoposide chemotherapy on 10/31/20. She then went on to received chemoRT with her 4th cycle of chemo being on 01/19/21. She has also completed prophylactic cranial irradiation on 03/21/21 and was given Namenda to try to reduce risks of long term cognitive deficits.   She continues to be followed in surveillance with Dr. Julien Nordmann. Her most recent post treatment MRI of the brain on 06/22/21 showed no evidence of disease. She's contacted to review these results.     PREVIOUS RADIATION  THERAPY:   03/08/2021 through 03/21/2021 Site Technique Total Dose (Gy) Dose per Fx (Gy) Completed Fx Beam Energies  Brain: Prophylactic Whole  Brain Treatment IMRT 25/25 2.5 10/10 6X    11/21/2020 through 01/13/2021 Site Technique Total Dose (Gy) Dose per Fx (Gy) Completed Fx Beam Energies  Lung, Left: Lung_Lt 3D 60/60 2 30/30 6X  Lung, Left: Lung_Lt_Bst 3D 6/6 2 3/3 6X    PAST MEDICAL HISTORY:  Past Medical History:  Diagnosis Date   Anemia    Anxiety    GERD (gastroesophageal reflux disease)    Hyperlipidemia    Hypertension    IBS (irritable bowel syndrome)    Insomnia    Low back pain    Lung cancer ([place]) 10/2020   TIA (transient ischemic attack) 10/2017   no deficits   Vitamin B12 deficiency    Vitamin D deficiency        PAST SURGICAL HISTORY: Past Surgical History:  Procedure Laterality Date   BACK SURGERY     BREAST EXCISIONAL BIOPSY Left 1997   benign   BRONCHIAL BIOPSY  10/18/2020   Procedure: BRONCHIAL BIOPSIES;  Surgeon: [name], DO;  Location: [facility];  Service: Pulmonary;;   BRONCHIAL BRUSHINGS  10/18/2020   Procedure: BRONCHIAL BRUSHINGS;  Surgeon: [name], DO;  Location: [facility];  Service: Pulmonary;;   BRONCHIAL NEEDLE ASPIRATION BIOPSY  10/18/2020   Procedure: BRONCHIAL NEEDLE ASPIRATION BIOPSIES;  Surgeon: [name], DO;  Location: [facility];  Service: Pulmonary;;   BRONCHIAL WASHINGS  10/18/2020   Procedure: BRONCHIAL WASHINGS;  Surgeon: [name], DO;  Location: [place];  Service: Pulmonary;;   CARDIOVERSION N/A 02/13/2021   Procedure: CARDIOVERSION;  Surgeon: Werner Lean, MD;  Location: [place];  Service: Cardiovascular;  Laterality: N/A;   CARDIOVERSION N/A 02/20/2021   Procedure: CARDIOVERSION;  Surgeon: Skeet Latch, MD;  Location: [place];  Service: Cardiovascular;  Laterality: N/A;   CARDIOVERSION N/A 03/22/2021   Procedure: CARDIOVERSION;  Surgeon: Freada Bergeron,  MD;  Location: [facility];  Service: Cardiovascular;  Laterality: N/A;   hemorrhoidecotmy     HERNIA MESH REMOVAL     OPEN REDUCTION INTERNAL FIXATION (ORIF) DISTAL RADIAL FRACTURE Right 11/19/2019   Procedure: OPEN REDUCTION INTERNAL FIXATION (ORIF) DISTAL RADIUS AND ULNA FRACTURE WITH REPAIR AS NECESSARY;  Surgeon: Roseanne Kaufman, MD;  Location: [place];  Service: Orthopedics;  Laterality: Right;  2 hrs Block with IV sedation   ORIF RADIUS & ULNA FRACTURES     TONSILLECTOMY     VIDEO BRONCHOSCOPY WITH ENDOBRONCHIAL NAVIGATION Left 10/18/2020   Procedure: VIDEO BRONCHOSCOPY WITH ENDOBRONCHIAL NAVIGATION;  Surgeon: [name], DO;  Location: [place];  Service: Pulmonary;  Laterality: Left;  ION   VIDEO BRONCHOSCOPY WITH ENDOBRONCHIAL ULTRASOUND Bilateral 10/18/2020   Procedure: VIDEO BRONCHOSCOPY WITH ENDOBRONCHIAL ULTRASOUND;  Surgeon: [name], DO;  Location: [place];  Service: Pulmonary;  Laterality: Bilateral;   VIDEO BRONCHOSCOPY WITH RADIAL ENDOBRONCHIAL ULTRASOUND  10/18/2020   Procedure: VIDEO BRONCHOSCOPY WITH RADIAL ENDOBRONCHIAL ULTRASOUND;  Surgeon: [name], DO;  Location: [facility];  Service: Pulmonary;;     FAMILY HISTORY:  Family History  Problem Relation Age of Onset   Breast cancer Sister    Breast cancer Maternal Aunt    Breast cancer Paternal Aunt      SOCIAL HISTORY:  reports that she quit smoking about 18 months ago. Her smoking use included cigarettes. She started smoking about 51 years ago. She has a 50.00 pack-year smoking history. She has never used smokeless tobacco. She reports that she does not currently use alcohol. She reports that she does not use drugs. The patient is married and lives in [place]. She is retired from working as a  travel Lawyer. She's accompanied by her husband.   ALLERGIES: Amoxicillin-pot clavulanate, Levaquin [levofloxacin in d5w], Magnesium hydroxide, Oxycodone, and  Other   MEDICATIONS:  Current Outpatient Medications  Medication Sig Dispense Refill   acetaminophen (TYLENOL) 325 MG tablet Take 650 mg by mouth every 6 (six) hours as needed for headache.     albuterol (VENTOLIN HFA) 108 (90 Base) MCG/ACT inhaler Inhale 2 puffs into the lungs every 6 (six) hours as needed for shortness of breath.     amiodarone (PACERONE) 200 MG tablet Take 1 tablet (200 mg total) by mouth daily. 90 tablet 3   apixaban (ELIQUIS) 5 MG TABS tablet TAKE 1 TABLET(5 MG) BY MOUTH TWICE DAILY 60 tablet 5   Black Cohosh (REMIFEMIN PO) Take 1 tablet by mouth 2 (two) times daily.     carboxymethylcellulose (REFRESH PLUS) 0.5 % SOLN Place 1 drop into both eyes 3 (three) times daily as needed (for dryness).      carisoprodol (SOMA) 350 MG tablet Take 350 mg by mouth 3 (three) times daily.   5   Cholecalciferol (VITAMIN D3 PO) Take 2 capsules by mouth daily.     clobetasol ointment (TEMOVATE) 6.07 % Apply 1 application. topically daily as needed (vaginal irritation).     clonazePAM (KLONOPIN) 1 MG tablet Take  1 mg by mouth at bedtime.   3   Coenzyme Q10 200 MG capsule Take 400 mg by mouth daily.     Cyanocobalamin (B-12 PO) Take 2 capsules by mouth daily.     docusate sodium (COLACE) 100 MG capsule Take 1 capsule (100 mg total) by mouth 2 (two) times daily. (Patient taking differently: Take 100 mg by mouth 2 (two) times daily as needed for moderate constipation.) 10 capsule 0   escitalopram (LEXAPRO) 10 MG tablet Take 1 tablet (10 mg total) by mouth daily. 30 tablet 2   fluticasone (FLONASE) 50 MCG/ACT nasal spray Place 2 sprays into both nostrils daily.  5   furosemide (LASIX) 20 MG tablet Take 1 tablet (20 mg total) by mouth daily. Take only if weight goes above 180 lbs. (Patient taking differently: Take 20 mg by mouth daily as needed (weight gain of 3 - 4 lbs overnight).) 30 tablet 2   gabapentin (NEURONTIN) 600 MG tablet Take 600 mg by mouth 2 (two) times daily. in the morning and at  lunchtime     guaiFENesin-dextromethorphan (ROBITUSSIN DM) 100-10 MG/5ML syrup Take 10 mLs by mouth every 4 (four) hours as needed for cough. 118 mL 0   hydrOXYzine (ATARAX) 25 MG tablet Take 25 mg by mouth at bedtime as needed for sleep.     ipratropium (ATROVENT) 0.02 % nebulizer solution Take 0.5 mg by nebulization 3 (three) times daily. As directed     levalbuterol (XOPENEX) 1.25 MG/3ML nebulizer solution Inhale 1.25 mg into the lungs 3 (three) times daily.     lidocaine (XYLOCAINE) 2 % solution Use as directed 15 mLs in the mouth or throat every 6 (six) hours as needed for mouth pain. 100 mL 0   linaclotide (LINZESS) 145 MCG CAPS capsule Take 1 capsule (145 mcg total) by mouth daily at 4 PM. 30 capsule 0   magic mouthwash SOLN Take 5 mLs by mouth 4 (four) times daily as needed for mouth pain. Pt is allergic to Magnesium 240 mL 1   magnesium oxide (MAG-OX) 400 (240 Mg) MG tablet Take 1 tablet (400 mg total) by mouth daily. 30 tablet 1   memantine (NAMENDA) 10 MG tablet Take 1 tablet (10 mg total) by mouth 2 (two) times daily. 60 tablet 4   memantine (NAMENDA) 5 MG tablet Begin this prescription the first day of brain radiation. Week 1: take one tablet po qam. Week 2: take one tablet qam and qpm. Week 3: take two tablets qam, and one tablet po q pm. Week 4: take two tablets qam and qpm. Fill subsequent prescription q month. (Patient taking differently: Take 10 mg by mouth 2 (two) times daily.) 70 tablet 0   metoprolol succinate (TOPROL-XL) 50 MG 24 hr tablet Take 1 tablet (50 mg total) by mouth at bedtime. Take with or immediately following a meal. 30 tablet 6   nystatin (MYCOSTATIN) 100000 UNIT/ML suspension Take 5 mLs (500,000 Units total) by mouth 4 (four) times daily. 440 mL 5   omeprazole (PRILOSEC) 20 MG capsule Take 20 mg by mouth daily before breakfast.      ondansetron (ZOFRAN-ODT) 8 MG disintegrating tablet Take 8 mg by mouth daily as needed for nausea.     polyethylene glycol (MIRALAX /  GLYCOLAX) packet Take 17 g by mouth in the morning.     prochlorperazine (COMPAZINE) 10 MG tablet Take 1 tablet (10 mg total) by mouth every 6 (six) hours as needed for nausea or vomiting. 30 tablet 0  rosuvastatin (CRESTOR) 5 MG tablet Take 5 mg by mouth every Monday, Wednesday, and Friday.     traMADol (ULTRAM) 50 MG tablet Take 50 mg by mouth in the morning, at noon, and at bedtime.     zaleplon (SONATA) 10 MG capsule Take 10 mg by mouth at bedtime as needed (for interrupted sleep).      No current facility-administered medications for this encounter.     REVIEW OF SYSTEMS: On review of systems, the patient reports that she is doing well overall. She reports her breathing has been good. She does have some shortness of breath however with increased activities. She has productive mucous when coughing. She has gained 20 pounds since quitting smoking last year. She denies any hemoptysis. No chest pain is noted.   PHYSICAL EXAM:  Wt Readings from Last 3 Encounters:  06/01/21 163 lb 4 oz (74 kg)  04/24/21 166 lb 9.6 oz (75.6 kg)  03/22/21 172 lb (78 kg)   Temp Readings from Last 3 Encounters:  06/01/21 98.1 F (36.7 C) (Tympanic)  03/22/21 97.9 F (36.6 C) (Temporal)  02/28/21 98.1 F (36.7 C) (Tympanic)   BP Readings from Last 3 Encounters:  06/01/21 101/64  04/24/21 90/60  03/22/21 (!) 104/53   Pulse Readings from Last 3 Encounters:  06/01/21 65  04/24/21 (!) 59  03/22/21 (!) 58   Pain Assessment Pain Score: 5  (Dorsalgia)/10  In general this is a well appearing caucasian female in no acute distress. She's alert and oriented x4 and appropriate throughout the examination. Cardiopulmonary assessment is negative for acute distress and she exhibits normal effort.     ECOG = 1  0 - Asymptomatic (Fully active, able to carry on all predisease activities without restriction)  1 - Symptomatic but completely ambulatory (Restricted in physically strenuous activity but ambulatory  and able to carry out work of a light or sedentary nature. For example, light housework, office work)  2 - Symptomatic, <50% in bed during the day (Ambulatory and capable of all self care but unable to carry out any work activities. Up and about more than 50% of waking hours)  3 - Symptomatic, >50% in bed, but not bedbound (Capable of only limited self-care, confined to bed or chair 50% or more of waking hours)  4 - Bedbound (Completely disabled. Cannot carry on any self-care. Totally confined to bed or chair)  5 - Death   Eustace Pen MM, Creech RH, Tormey DC, et al. [phone]). "Toxicity and response criteria of the [facility]". [place] Oncol. 5 (6): 649-55    LABORATORY DATA:  Lab Results  Component Value Date   WBC 4.7 05/29/2021   HGB 11.5 (L) 05/29/2021   HCT 32.5 (L) 05/29/2021   MCV 109.4 (H) 05/29/2021   PLT 126 (L) 05/29/2021   Lab Results  Component Value Date   NA 130 (L) 05/29/2021   K 4.2 05/29/2021   CL 90 (L) 05/29/2021   CO2 33 (H) 05/29/2021   Lab Results  Component Value Date   ALT 20 05/29/2021   AST 21 05/29/2021   ALKPHOS 66 05/29/2021   BILITOT 0.4 05/29/2021      RADIOGRAPHY: MR Brain W Wo Contrast  Result Date: 06/23/2021 CLINICAL DATA:  Brain metastases, assess treatment response 3T SRS Protocol; prophylactic whole brain radiation EXAM: MRI HEAD WITHOUT AND WITH CONTRAST TECHNIQUE: Multiplanar, multiecho pulse sequences of the brain and surrounding structures were obtained without and with intravenous contrast. CONTRAST:  65mL GADAVIST  GADOBUTROL 1 MMOL/ML IV SOLN COMPARISON:  02/14/2021 FINDINGS: Brain: There is no acute infarction or intracranial hemorrhage. There is no intracranial mass, mass effect, or edema. There is no hydrocephalus or extra-axial fluid collection. Ventricles and sulci are stable in size and configuration. Patchy T2 hyperintensity in the supratentorial white matter is similar to the prior study and may reflect  chronic microvascular ischemic changes. No abnormal enhancement. Vascular: Major vessel flow voids at the skull base are preserved. Skull and upper cervical spine: Normal marrow signal is preserved. Sinuses/Orbits: Paranasal sinuses are aerated. Orbits are unremarkable. Other: Sella is unremarkable.  Persistent left mastoid effusion. IMPRESSION: No evidence of intracranial metastatic disease. Electronically Signed   By: Macy Mis M.D.   On: 06/23/2021 10:19   CT Chest W Contrast  Result Date: 05/30/2021 CLINICAL DATA:  Small cell lung cancer restaging. Prior radiation therapy to the left lower lobe, most recently 01/13/2021 based on review of the medical records. * Tracking Code: BO * EXAM: CT CHEST WITH CONTRAST TECHNIQUE: Multidetector CT imaging of the chest was performed during intravenous contrast administration. RADIATION DOSE REDUCTION: This exam was performed according to the departmental dose-optimization program which includes automated exposure control, adjustment of the mA and/or kV according to patient size and/or use of iterative reconstruction technique. CONTRAST:  27mL OMNIPAQUE IOHEXOL 300 MG/ML  SOLN COMPARISON:  Multiple exams, including 02/24/2021 FINDINGS: Cardiovascular: Coronary, aortic arch, and branch vessel atherosclerotic vascular disease. Mediastinum/Nodes: Stable prominence of epicardial adipose tissue. Right hilar node 0.8 cm in short axis on image 73 series 2, formerly 1.0 cm. Lungs/Pleura: Centrilobular emphysema. Small amount of mucus in the right mainstem bronchus. Moderate left pleural effusion, mildly reduced from previous. Progressive bandlike density in the left upper lobe anteriorly and extending in the infrahilar region in the left lower lobe, with worsening volume loss and airspace opacity in the left lower lobe although in a configuration potentially from radiation therapy. Pneumonia not excluded but less likely. Upper Abdomen: Abdominal aortic atherosclerosis.  Musculoskeletal: Prior L2 compression fracture with vertebral augmentation. IMPRESSION: 1. Left infrahilar bandlike airspace opacity in the lingula and left lower lobe, probably from radiation therapy given the configuration. The opacity is increased from previous. Superimposed pneumonia not excluded. I do not see obvious focal tumor. 2. Moderate but improved left pleural effusion. 3. Other imaging findings of potential clinical significance: Aortic Atherosclerosis (ICD10-I70.0) and Emphysema (ICD10-J43.9). Coronary atherosclerosis. Prior L2 compression fracture with vertebral augmentation. Electronically Signed   By: Van Clines M.D.   On: 05/30/2021 16:54       IMPRESSION/PLAN: 1. 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 The patient is doing well radiographically in the chest and in the brain. She is in agreement to continue surveillance in the brain oncology program. She will also continue surveillance with Dr. Julien Nordmann. We will plan to follow up with her in about 3-4 with serial MRI of the brain. She will keep [place] informed of any questions or concerns that arise prior to that visit. 2. PCI. She continues to take Namenda and will complete her course mid August.  3. Hair loss and poor taste. I suspect this has more to do with her prior chemotherapy but her hair loss persisting could also be complicated by her whole brain radiation treatment I encouraged her to follow up with her ENT provider for dysgeusia work up and with dermatology to see if she would benefit from any topical medication to try to stimulate hair growth. 4. Left mastoid effusion. She is  using hearing aides which seem to help her and I offered steroid dose pack since she can't take Sudafed. She is not interested in this at this time but will let us know if she wants to revisit this.     This encounter was conducted via telephone.  The patient has provided two factor identification and has given verbal consent for this type  of encounter and has been advised to only accept a meeting of this type in a secure network environment. The time spent during this encounter was 40 minutes including preparation, discussion, and coordination of the patient's care. The attendants for this meeting include  Hayden Pedro  and [name].  During the encounter,   Hayden Pedro was located at [facility] Radiation Oncology Department.  Laela Deviney was located at home.      Carola Rhine, [facility]   **Disclaimer: This note was dictated with voice recognition software. Similar sounding words can inadvertently be transcribed and this note may contain transcription errors which may not have been corrected upon publication of note.**

## 2021-06-26 NOTE — Progress Notes (Signed)
Telephone appointment reminder. I verified patient's identity and began nursing interview. Patient reports dorsalgia 5/10. No other issues reported at this time.  Meaningful use complete.  Reminded patient of her 1:30pm-06/26/21 telephone appointment w/ Shona Simpson PA-C. I left my extension [phone] in case patient needs anything. Patient verbalized understanding.  Patient contact [phone]

## 2021-06-29 ENCOUNTER — Ambulatory Visit (INDEPENDENT_AMBULATORY_CARE_PROVIDER_SITE_OTHER): Payer: Medicare HMO | Admitting: Student

## 2021-06-29 ENCOUNTER — Encounter: Payer: Self-pay | Admitting: Student

## 2021-06-29 VITALS — BP 101/61 | HR 71 | Ht 67.0 in | Wt 164.4 lb

## 2021-06-29 DIAGNOSIS — I480 Paroxysmal atrial fibrillation: Secondary | ICD-10-CM

## 2021-06-29 DIAGNOSIS — J9611 Chronic respiratory failure with hypoxia: Secondary | ICD-10-CM

## 2021-06-29 DIAGNOSIS — I471 Supraventricular tachycardia: Secondary | ICD-10-CM | POA: Diagnosis not present

## 2021-06-29 DIAGNOSIS — I10 Essential (primary) hypertension: Secondary | ICD-10-CM | POA: Diagnosis not present

## 2021-06-29 DIAGNOSIS — C3490 Malignant neoplasm of unspecified part of unspecified bronchus or lung: Secondary | ICD-10-CM

## 2021-06-29 DIAGNOSIS — E785 Hyperlipidemia, unspecified: Secondary | ICD-10-CM | POA: Diagnosis not present

## 2021-06-29 DIAGNOSIS — I5020 Unspecified systolic (congestive) heart failure: Secondary | ICD-10-CM | POA: Diagnosis not present

## 2021-06-29 DIAGNOSIS — I4892 Unspecified atrial flutter: Secondary | ICD-10-CM | POA: Diagnosis not present

## 2021-06-29 NOTE — Patient Instructions (Signed)
Medication Instructions:  Increase Crestor 5 mg ( Take 1 Tablet Daily). *If you need a refill on your cardiac medications before your next appointment, please call your pharmacy*   Lab Work: Lipid Panel, Hepatic Panel. To Be Done In 2 Months. If you have labs (blood work) drawn today and your tests are completely normal, you will receive your results only by: [place] (if you have MyChart) OR A paper copy in the mail If you have any lab test that is abnormal or we need to change your treatment, we will call you to review the results.   Testing/Procedures: No Testing   Follow-Up: At [facility], you and your health needs are our priority.  As part of our continuing mission to provide you with exceptional heart care, we have created designated Provider Care Teams.  These Care Teams include your primary Cardiologist (physician) and Advanced Practice Providers (APPs -  Physician Assistants and Nurse Practitioners) who all work together to provide you with the care you need, when you need it.  We recommend signing up for the patient portal called "MyChart".  Sign up information is provided on this After Visit Summary.  MyChart is used to connect with patients for Virtual Visits (Telemedicine).  Patients are able to view lab/test results, encounter notes, upcoming appointments, etc.  Non-urgent messages can be sent to your provider as well.   To learn more about what you can do with MyChart, go to [email or web address].    Your next appointment:   6 month(s)  The format for your next appointment:   In Person  Provider:   Sande Rives, PA-C  or,  Kirk Ruths, MD .      Important Information About Sugar

## 2021-06-30 DIAGNOSIS — M549 Dorsalgia, unspecified: Secondary | ICD-10-CM | POA: Diagnosis not present

## 2021-07-03 ENCOUNTER — Ambulatory Visit
Admission: RE | Admit: 2021-07-03 | Discharge: 2021-07-03 | Disposition: A | Discharge status: Home / Self Care | Payer: Medicare HMO | Source: Ambulatory Visit | Attending: Physician Assistant | Admitting: Physician Assistant

## 2021-07-03 ENCOUNTER — Telehealth: Payer: Self-pay | Admitting: Medical Oncology

## 2021-07-03 ENCOUNTER — Other Ambulatory Visit: Payer: Self-pay | Admitting: Physician Assistant

## 2021-07-03 DIAGNOSIS — M4312 Spondylolisthesis, cervical region: Secondary | ICD-10-CM | POA: Diagnosis not present

## 2021-07-03 DIAGNOSIS — M546 Pain in thoracic spine: Secondary | ICD-10-CM | POA: Diagnosis not present

## 2021-07-03 DIAGNOSIS — M47814 Spondylosis without myelopathy or radiculopathy, thoracic region: Secondary | ICD-10-CM | POA: Diagnosis not present

## 2021-07-03 DIAGNOSIS — M542 Cervicalgia: Secondary | ICD-10-CM | POA: Diagnosis not present

## 2021-07-03 DIAGNOSIS — M5134 Other intervertebral disc degeneration, thoracic region: Secondary | ICD-10-CM | POA: Diagnosis not present

## 2021-07-03 DIAGNOSIS — M47813 Spondylosis without myelopathy or radiculopathy, cervicothoracic region: Secondary | ICD-10-CM

## 2021-07-04 DIAGNOSIS — I4891 Unspecified atrial fibrillation: Secondary | ICD-10-CM | POA: Diagnosis not present

## 2021-07-05 ENCOUNTER — Ambulatory Visit
Admission: RE | Admit: 2021-07-05 | Discharge: 2021-07-05 | Disposition: A | Discharge status: Home / Self Care | Payer: Medicare HMO | Source: Ambulatory Visit | Attending: Family Medicine | Admitting: Family Medicine

## 2021-07-05 ENCOUNTER — Other Ambulatory Visit: Payer: Self-pay | Admitting: Family Medicine

## 2021-07-05 DIAGNOSIS — R9389 Abnormal findings on diagnostic imaging of other specified body structures: Secondary | ICD-10-CM

## 2021-07-05 DIAGNOSIS — R059 Cough, unspecified: Secondary | ICD-10-CM | POA: Diagnosis not present

## 2021-07-05 DIAGNOSIS — R0609 Other forms of dyspnea: Secondary | ICD-10-CM | POA: Diagnosis not present

## 2021-07-06 ENCOUNTER — Emergency Department (HOSPITAL_COMMUNITY): Payer: Medicare HMO

## 2021-07-06 ENCOUNTER — Inpatient Hospital Stay (HOSPITAL_COMMUNITY)
Admission: EM | Admit: 2021-07-06 | Discharge: 2021-07-09 | DRG: 194 | Disposition: A | Discharge status: Home Health | Payer: Medicare HMO | Attending: Internal Medicine | Admitting: Internal Medicine

## 2021-07-06 ENCOUNTER — Encounter (HOSPITAL_COMMUNITY): Payer: Self-pay

## 2021-07-06 ENCOUNTER — Other Ambulatory Visit: Payer: Self-pay

## 2021-07-06 DIAGNOSIS — D539 Nutritional anemia, unspecified: Secondary | ICD-10-CM | POA: Diagnosis present

## 2021-07-06 DIAGNOSIS — Z87891 Personal history of nicotine dependence: Secondary | ICD-10-CM

## 2021-07-06 DIAGNOSIS — R0600 Dyspnea, unspecified: Secondary | ICD-10-CM | POA: Diagnosis not present

## 2021-07-06 DIAGNOSIS — Z20822 Contact with and (suspected) exposure to covid-19: Secondary | ICD-10-CM | POA: Diagnosis not present

## 2021-07-06 DIAGNOSIS — J90 Pleural effusion, not elsewhere classified: Secondary | ICD-10-CM | POA: Diagnosis not present

## 2021-07-06 DIAGNOSIS — E559 Vitamin D deficiency, unspecified: Secondary | ICD-10-CM | POA: Diagnosis present

## 2021-07-06 DIAGNOSIS — Z515 Encounter for palliative care: Secondary | ICD-10-CM | POA: Diagnosis not present

## 2021-07-06 DIAGNOSIS — E440 Moderate protein-calorie malnutrition: Secondary | ICD-10-CM | POA: Diagnosis not present

## 2021-07-06 DIAGNOSIS — I5033 Acute on chronic diastolic (congestive) heart failure: Secondary | ICD-10-CM | POA: Diagnosis not present

## 2021-07-06 DIAGNOSIS — I480 Paroxysmal atrial fibrillation: Secondary | ICD-10-CM | POA: Diagnosis not present

## 2021-07-06 DIAGNOSIS — Z923 Personal history of irradiation: Secondary | ICD-10-CM

## 2021-07-06 DIAGNOSIS — D696 Thrombocytopenia, unspecified: Secondary | ICD-10-CM | POA: Diagnosis not present

## 2021-07-06 DIAGNOSIS — I110 Hypertensive heart disease with heart failure: Secondary | ICD-10-CM | POA: Diagnosis present

## 2021-07-06 DIAGNOSIS — I509 Heart failure, unspecified: Secondary | ICD-10-CM | POA: Diagnosis present

## 2021-07-06 DIAGNOSIS — D630 Anemia in neoplastic disease: Secondary | ICD-10-CM | POA: Diagnosis not present

## 2021-07-06 DIAGNOSIS — Z7189 Other specified counseling: Secondary | ICD-10-CM | POA: Diagnosis not present

## 2021-07-06 DIAGNOSIS — R0602 Shortness of breath: Secondary | ICD-10-CM | POA: Diagnosis not present

## 2021-07-06 DIAGNOSIS — D701 Agranulocytosis secondary to cancer chemotherapy: Secondary | ICD-10-CM | POA: Diagnosis not present

## 2021-07-06 DIAGNOSIS — Z9981 Dependence on supplemental oxygen: Secondary | ICD-10-CM

## 2021-07-06 DIAGNOSIS — Z79899 Other long term (current) drug therapy: Secondary | ICD-10-CM | POA: Diagnosis not present

## 2021-07-06 DIAGNOSIS — E538 Deficiency of other specified B group vitamins: Secondary | ICD-10-CM | POA: Diagnosis present

## 2021-07-06 DIAGNOSIS — F419 Anxiety disorder, unspecified: Secondary | ICD-10-CM | POA: Diagnosis present

## 2021-07-06 DIAGNOSIS — J189 Pneumonia, unspecified organism: Principal | ICD-10-CM | POA: Diagnosis present

## 2021-07-06 DIAGNOSIS — J9611 Chronic respiratory failure with hypoxia: Secondary | ICD-10-CM | POA: Diagnosis present

## 2021-07-06 DIAGNOSIS — E871 Hypo-osmolality and hyponatremia: Secondary | ICD-10-CM | POA: Diagnosis present

## 2021-07-06 DIAGNOSIS — K219 Gastro-esophageal reflux disease without esophagitis: Secondary | ICD-10-CM | POA: Diagnosis present

## 2021-07-06 DIAGNOSIS — R079 Chest pain, unspecified: Secondary | ICD-10-CM | POA: Diagnosis not present

## 2021-07-06 DIAGNOSIS — J9621 Acute and chronic respiratory failure with hypoxia: Secondary | ICD-10-CM | POA: Diagnosis not present

## 2021-07-06 DIAGNOSIS — Z8673 Personal history of transient ischemic attack (TIA), and cerebral infarction without residual deficits: Secondary | ICD-10-CM

## 2021-07-06 DIAGNOSIS — Z7901 Long term (current) use of anticoagulants: Secondary | ICD-10-CM | POA: Diagnosis not present

## 2021-07-06 DIAGNOSIS — Z9221 Personal history of antineoplastic chemotherapy: Secondary | ICD-10-CM

## 2021-07-06 DIAGNOSIS — R531 Weakness: Secondary | ICD-10-CM | POA: Diagnosis not present

## 2021-07-06 DIAGNOSIS — C7931 Secondary malignant neoplasm of brain: Secondary | ICD-10-CM | POA: Diagnosis present

## 2021-07-06 DIAGNOSIS — J439 Emphysema, unspecified: Secondary | ICD-10-CM | POA: Diagnosis not present

## 2021-07-06 DIAGNOSIS — I4819 Other persistent atrial fibrillation: Secondary | ICD-10-CM | POA: Diagnosis not present

## 2021-07-06 DIAGNOSIS — C3492 Malignant neoplasm of unspecified part of left bronchus or lung: Secondary | ICD-10-CM | POA: Diagnosis not present

## 2021-07-06 DIAGNOSIS — E785 Hyperlipidemia, unspecified: Secondary | ICD-10-CM | POA: Diagnosis present

## 2021-07-06 DIAGNOSIS — I4892 Unspecified atrial flutter: Secondary | ICD-10-CM | POA: Diagnosis not present

## 2021-07-06 DIAGNOSIS — Z66 Do not resuscitate: Secondary | ICD-10-CM | POA: Diagnosis present

## 2021-07-06 DIAGNOSIS — C3432 Malignant neoplasm of lower lobe, left bronchus or lung: Secondary | ICD-10-CM | POA: Diagnosis not present

## 2021-07-06 DIAGNOSIS — Z803 Family history of malignant neoplasm of breast: Secondary | ICD-10-CM

## 2021-07-06 DIAGNOSIS — K5900 Constipation, unspecified: Secondary | ICD-10-CM | POA: Diagnosis present

## 2021-07-06 DIAGNOSIS — C3490 Malignant neoplasm of unspecified part of unspecified bronchus or lung: Secondary | ICD-10-CM | POA: Diagnosis not present

## 2021-07-06 LAB — HEPATIC FUNCTION PANEL
ALT: 32 U/L (ref 0–44)
AST: 26 U/L (ref 15–41)
Albumin: 3.1 g/dL — ABNORMAL LOW (ref 3.5–5.0)
Alkaline Phosphatase: 78 U/L (ref 38–126)
Bilirubin, Direct: 0.1 mg/dL (ref 0.0–0.2)
Indirect Bilirubin: 0.5 mg/dL (ref 0.3–0.9)
Total Bilirubin: 0.6 mg/dL (ref 0.3–1.2)
Total Protein: 5.8 g/dL — ABNORMAL LOW (ref 6.5–8.1)

## 2021-07-06 LAB — C-REACTIVE PROTEIN: CRP: 3.5 mg/dL — ABNORMAL HIGH (ref <1.0)

## 2021-07-06 LAB — BASIC METABOLIC PANEL
Anion gap: 8 (ref 5–15)
BUN: 12 mg/dL (ref 8–23)
CO2: 30 mmol/L (ref 22–32)
Calcium: 9.1 mg/dL (ref 8.9–10.3)
Chloride: 96 mmol/L — ABNORMAL LOW (ref 98–111)
Creatinine, Ser: 0.83 mg/dL (ref 0.44–1.00)
GFR, Estimated: >60 mL/min (ref >60)
Glucose, Bld: 102 mg/dL — ABNORMAL HIGH (ref 70–99)
Potassium: 4.9 mmol/L (ref 3.5–5.1)
Sodium: 134 mmol/L — ABNORMAL LOW (ref 135–145)

## 2021-07-06 LAB — CBC
HCT: 31.7 % — ABNORMAL LOW (ref 36.0–46.0)
Hemoglobin: 11 g/dL — ABNORMAL LOW (ref 12.0–15.0)
MCH: 39 pg — ABNORMAL HIGH (ref 26.0–34.0)
MCHC: 34.7 g/dL (ref 30.0–36.0)
MCV: 112.4 fL — ABNORMAL HIGH (ref 80.0–100.0)
Platelets: 107 10*3/uL — ABNORMAL LOW (ref 150–400)
RBC: 2.82 MIL/uL — ABNORMAL LOW (ref 3.87–5.11)
RDW: 13.6 % (ref 11.5–15.5)
WBC: 5.6 10*3/uL (ref 4.0–10.5)
nRBC: 0 % (ref 0.0–0.2)

## 2021-07-06 LAB — BRAIN NATRIURETIC PEPTIDE: B Natriuretic Peptide: 122.6 pg/mL — ABNORMAL HIGH (ref 0.0–100.0)

## 2021-07-06 LAB — LACTIC ACID, PLASMA
Lactic Acid, Venous: 0.8 mmol/L (ref 0.5–1.9)
Lactic Acid, Venous: 0.8 mmol/L (ref 0.5–1.9)

## 2021-07-06 LAB — PROCALCITONIN: Procalcitonin: <0.1 ng/mL

## 2021-07-06 LAB — LIPASE, BLOOD: Lipase: 20 U/L (ref 11–51)

## 2021-07-06 LAB — TROPONIN I (HIGH SENSITIVITY)
Troponin I (High Sensitivity): 4 ng/L (ref <18)
Troponin I (High Sensitivity): 4 ng/L (ref <18)

## 2021-07-06 LAB — SARS CORONAVIRUS 2 BY RT PCR: SARS Coronavirus 2 by RT PCR: NEGATIVE

## 2021-07-06 MED ORDER — HYDROXYZINE HCL 25 MG PO TABS
25.0000 mg | ORAL_TABLET | Freq: Every evening | ORAL | Status: DC | PRN
Start: 2021-07-06 — End: 2021-07-09

## 2021-07-06 MED ORDER — CARISOPRODOL 350 MG PO TABS
350.0000 mg | ORAL_TABLET | Freq: Two times a day (BID) | ORAL | Status: DC
  Administered 2021-07-06 – 2021-07-09 (×6): 350 mg via ORAL
  Filled 2021-07-06 (×6): qty 1

## 2021-07-06 MED ORDER — MEMANTINE HCL 5 MG PO TABS
10.0000 mg | ORAL_TABLET | Freq: Two times a day (BID) | ORAL | Status: DC
  Administered 2021-07-06 – 2021-07-09 (×6): 10 mg via ORAL
  Filled 2021-07-06 (×7): qty 2

## 2021-07-06 MED ORDER — MORPHINE SULFATE (PF) 4 MG/ML IV SOLN
6.0000 mg | Freq: Once | INTRAVENOUS | Status: AC
  Administered 2021-07-06: 6 mg via INTRAVENOUS
  Filled 2021-07-06: qty 2

## 2021-07-06 MED ORDER — ACETAMINOPHEN 325 MG PO TABS: 650.0000 mg | ORAL_TABLET | Freq: Four times a day (QID) | ORAL | Status: DC | PRN

## 2021-07-06 MED ORDER — ALBUTEROL SULFATE (2.5 MG/3ML) 0.083% IN NEBU
2.5000 mg | INHALATION_SOLUTION | Freq: Four times a day (QID) | RESPIRATORY_TRACT | Status: DC
  Administered 2021-07-06: 2.5 mg via RESPIRATORY_TRACT
  Filled 2021-07-06: qty 3

## 2021-07-06 MED ORDER — AMIODARONE HCL 200 MG PO TABS
200.0000 mg | ORAL_TABLET | Freq: Every day | ORAL | Status: DC
  Administered 2021-07-07 – 2021-07-09 (×3): 200 mg via ORAL
  Filled 2021-07-06 (×3): qty 1

## 2021-07-06 MED ORDER — SODIUM CHLORIDE 0.9 % IV SOLN
1.0000 g | INTRAVENOUS | Status: DC
  Administered 2021-07-06 – 2021-07-08 (×3): 1 g via INTRAVENOUS
  Filled 2021-07-06 (×4): qty 10

## 2021-07-06 MED ORDER — FUROSEMIDE 40 MG PO TABS
20.0000 mg | ORAL_TABLET | Freq: Every day | ORAL | Status: DC
  Administered 2021-07-06 – 2021-07-09 (×4): 20 mg via ORAL
  Filled 2021-07-06 (×4): qty 1

## 2021-07-06 MED ORDER — MAGIC MOUTHWASH: 5.0000 mL | Freq: Four times a day (QID) | ORAL | Status: DC | PRN

## 2021-07-06 MED ORDER — VITAMIN D3 25 MCG (1000 UNIT) PO TABS
1000.0000 [IU] | ORAL_TABLET | Freq: Every day | ORAL | Status: DC
  Administered 2021-07-07 – 2021-07-09 (×3): 1000 [IU] via ORAL
  Filled 2021-07-06 (×3): qty 1

## 2021-07-06 MED ORDER — FLUTICASONE PROPIONATE 50 MCG/ACT NA SUSP
2.0000 | Freq: Every evening | NASAL | Status: DC
Start: 2021-07-06 — End: 2021-07-09
  Administered 2021-07-06 – 2021-07-08 (×3): 2 via NASAL
  Filled 2021-07-06: qty 16

## 2021-07-06 MED ORDER — GUAIFENESIN-DM 100-10 MG/5ML PO SYRP: 10.0000 mL | ORAL_SOLUTION | ORAL | Status: DC | PRN

## 2021-07-06 MED ORDER — ALBUTEROL SULFATE (2.5 MG/3ML) 0.083% IN NEBU
5.0000 mg | INHALATION_SOLUTION | Freq: Once | RESPIRATORY_TRACT | Status: AC
  Administered 2021-07-06: 5 mg via RESPIRATORY_TRACT
  Filled 2021-07-06: qty 6

## 2021-07-06 MED ORDER — CLONAZEPAM 0.5 MG PO TABS
1.0000 mg | ORAL_TABLET | Freq: Every day | ORAL | Status: DC
Start: 2021-07-06 — End: 2021-07-09
  Administered 2021-07-06 – 2021-07-08 (×3): 1 mg via ORAL
  Filled 2021-07-06 (×3): qty 2

## 2021-07-06 MED ORDER — SENNOSIDES-DOCUSATE SODIUM 8.6-50 MG PO TABS: 1.0000 | ORAL_TABLET | Freq: Every evening | ORAL | Status: DC | PRN

## 2021-07-06 MED ORDER — SODIUM CHLORIDE (PF) 0.9 % IJ SOLN
INTRAMUSCULAR | Status: AC
  Filled 2021-07-06: qty 50

## 2021-07-06 MED ORDER — IOHEXOL 350 MG/ML SOLN
100.0000 mL | Freq: Once | INTRAVENOUS | Status: AC | PRN
  Administered 2021-07-06: 100 mL via INTRAVENOUS

## 2021-07-06 MED ORDER — MEMANTINE HCL 5 MG PO TABS
5.0000 mg | ORAL_TABLET | Freq: Every day | ORAL | Status: DC
Start: 2021-07-06 — End: 2021-07-06

## 2021-07-06 MED ORDER — APIXABAN 5 MG PO TABS
5.0000 mg | ORAL_TABLET | Freq: Two times a day (BID) | ORAL | Status: DC
  Administered 2021-07-06 – 2021-07-09 (×6): 5 mg via ORAL
  Filled 2021-07-06 (×6): qty 1

## 2021-07-06 MED ORDER — ONDANSETRON 8 MG PO TBDP: 8.0000 mg | ORAL_TABLET | Freq: Every day | ORAL | Status: DC | PRN

## 2021-07-06 MED ORDER — B-12 250 MCG PO TABS: ORAL_TABLET | Freq: Every day | ORAL | Status: DC

## 2021-07-06 MED ORDER — SODIUM CHLORIDE 0.9 % IV SOLN
500.0000 mg | INTRAVENOUS | Status: DC
  Administered 2021-07-06 – 2021-07-08 (×3): 500 mg via INTRAVENOUS
  Filled 2021-07-06 (×4): qty 5

## 2021-07-06 MED ORDER — METOPROLOL SUCCINATE ER 50 MG PO TB24
25.0000 mg | ORAL_TABLET | Freq: Every day | ORAL | Status: DC
  Filled 2021-07-06 (×3): qty 1

## 2021-07-06 MED ORDER — LINACLOTIDE 145 MCG PO CAPS
145.0000 ug | ORAL_CAPSULE | Freq: Every day | ORAL | Status: DC
  Filled 2021-07-06 (×4): qty 1

## 2021-07-06 MED ORDER — ESCITALOPRAM OXALATE 10 MG PO TABS
10.0000 mg | ORAL_TABLET | Freq: Every day | ORAL | Status: DC
  Administered 2021-07-07 – 2021-07-09 (×3): 10 mg via ORAL
  Filled 2021-07-06 (×3): qty 1

## 2021-07-06 MED ORDER — FUROSEMIDE 40 MG PO TABS: 20.0000 mg | ORAL_TABLET | Freq: Every day | ORAL | Status: DC | PRN

## 2021-07-06 MED ORDER — PROCHLORPERAZINE MALEATE 10 MG PO TABS: 10.0000 mg | ORAL_TABLET | Freq: Four times a day (QID) | ORAL | Status: DC | PRN

## 2021-07-06 MED ORDER — ZOLPIDEM TARTRATE 5 MG PO TABS
5.0000 mg | ORAL_TABLET | Freq: Every evening | ORAL | Status: DC | PRN
  Administered 2021-07-07: 5 mg via ORAL
  Filled 2021-07-06: qty 1

## 2021-07-06 MED ORDER — ROSUVASTATIN CALCIUM 5 MG PO TABS
5.0000 mg | ORAL_TABLET | Freq: Every day | ORAL | Status: DC
  Administered 2021-07-07 – 2021-07-09 (×3): 5 mg via ORAL
  Filled 2021-07-06 (×3): qty 1

## 2021-07-06 MED ORDER — GABAPENTIN 300 MG PO CAPS
600.0000 mg | ORAL_CAPSULE | Freq: Two times a day (BID) | ORAL | Status: DC
  Administered 2021-07-06 – 2021-07-09 (×6): 600 mg via ORAL
  Filled 2021-07-06 (×6): qty 2

## 2021-07-06 MED ORDER — PANTOPRAZOLE SODIUM 40 MG PO TBEC
40.0000 mg | DELAYED_RELEASE_TABLET | Freq: Every day | ORAL | Status: DC
  Administered 2021-07-07 – 2021-07-09 (×3): 40 mg via ORAL
  Filled 2021-07-06 (×3): qty 1

## 2021-07-06 MED ORDER — ALBUTEROL SULFATE (2.5 MG/3ML) 0.083% IN NEBU
2.5000 mg | INHALATION_SOLUTION | Freq: Three times a day (TID) | RESPIRATORY_TRACT | Status: DC
  Administered 2021-07-07 – 2021-07-08 (×4): 2.5 mg via RESPIRATORY_TRACT
  Filled 2021-07-06 (×4): qty 3

## 2021-07-06 MED ORDER — TRAMADOL HCL 50 MG PO TABS
50.0000 mg | ORAL_TABLET | Freq: Three times a day (TID) | ORAL | Status: DC | PRN
  Administered 2021-07-06: 50 mg via ORAL
  Filled 2021-07-06: qty 1

## 2021-07-06 MED ORDER — METOPROLOL TARTRATE 5 MG/5ML IV SOLN: 5.0000 mg | Freq: Four times a day (QID) | INTRAVENOUS | Status: DC | PRN

## 2021-07-06 MED ORDER — HEPARIN SODIUM (PORCINE) 5000 UNIT/ML IJ SOLN
5000.0000 [IU] | Freq: Three times a day (TID) | INTRAMUSCULAR | Status: DC
Start: 2021-07-06 — End: 2021-07-06
  Filled 2021-07-06: qty 1

## 2021-07-06 NOTE — H&P (Signed)
History and Physical    Patient: Patricia [name] ZHY:[id number] DOB: 10/30/1950 DOA: 07/06/2021 DOS: the patient was seen and examined on 07/06/2021 PCP: [name], MD  Patient coming from: Home  Chief Complaint:  Chief Complaint  Patient presents with   Shortness of Breath   Chest Pain   cancer patient   HPI: Patricia [name] is a 71 y.o. 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  Patient reports having pleuritic chest pain has been ongoing for the past one week.  Has a cough that is nonproductive.  This morning her shortness of breath appeared to be worse, she noted that her oxygen saturations were about 83% on her 2 L of nasal cannula.  Reports having intermittent chest pain. Patient denies fevers, chills, nausea, vomiting. At The ER chest x-ray showed small left pleural effusion questionable infection.  CT of the chest obtained showed no evidence of PE however showed underlying scarring and superimposed pneumonia.  Patient is being admitted for management of pneumonia.  \Review of Systems: As mentioned in the history of present illness. All other systems reviewed and are negative. Past Medical History:  Diagnosis Date   Anemia    Anxiety    GERD (gastroesophageal reflux disease)    Hyperlipidemia    Hypertension    IBS (irritable bowel syndrome)    Insomnia    Low back pain    Lung cancer ([place]) 10/2020   TIA (transient ischemic attack) 10/2017   no deficits   Vitamin B12 deficiency    Vitamin D deficiency    Past Surgical History:  Procedure Laterality Date   BACK SURGERY     BREAST EXCISIONAL BIOPSY Left 1997   benign   BRONCHIAL BIOPSY  10/18/2020   Procedure:  BRONCHIAL BIOPSIES;  Surgeon: [name], DO;  Location: [facility] [facility];  Service: Pulmonary;;   BRONCHIAL BRUSHINGS  10/18/2020   Procedure: BRONCHIAL BRUSHINGS;  Surgeon: [name], DO;  Location: [place];  Service: Pulmonary;;   BRONCHIAL NEEDLE ASPIRATION BIOPSY  10/18/2020   Procedure: BRONCHIAL NEEDLE ASPIRATION BIOPSIES;  Surgeon: [name], DO;  Location: [place];  Service: Pulmonary;;   BRONCHIAL WASHINGS  10/18/2020   Procedure: BRONCHIAL WASHINGS;  Surgeon: [name], DO;  Location: [place];  Service: Pulmonary;;   CARDIOVERSION N/A 02/13/2021   Procedure: CARDIOVERSION;  Surgeon: Werner Lean, MD;  Location: [place];  Service: Cardiovascular;  Laterality: N/A;   CARDIOVERSION N/A 02/20/2021   Procedure: CARDIOVERSION;  Surgeon: Skeet Latch, MD;  Location: [place];  Service: Cardiovascular;  Laterality: N/A;   CARDIOVERSION N/A 03/22/2021   Procedure: CARDIOVERSION;  Surgeon: Freada Bergeron, MD;  Location: [facility];  Service: Cardiovascular;  Laterality: N/A;   hemorrhoidecotmy     HERNIA MESH REMOVAL     OPEN REDUCTION INTERNAL FIXATION (ORIF) DISTAL RADIAL FRACTURE Right 11/19/2019   Procedure: OPEN REDUCTION INTERNAL FIXATION (ORIF) DISTAL RADIUS AND ULNA FRACTURE WITH REPAIR AS NECESSARY;  Surgeon: Roseanne Kaufman, MD;  Location: [place];  Service: Orthopedics;  Laterality: Right;  2 hrs Block with IV sedation   ORIF RADIUS & ULNA FRACTURES     TONSILLECTOMY     VIDEO BRONCHOSCOPY WITH ENDOBRONCHIAL NAVIGATION Left 10/18/2020   Procedure: VIDEO BRONCHOSCOPY WITH  ENDOBRONCHIAL NAVIGATION;  Surgeon: [name], DO;  Location: [place] [facility];  Service: Pulmonary;  Laterality: Left;  ION   VIDEO BRONCHOSCOPY WITH ENDOBRONCHIAL ULTRASOUND Bilateral 10/18/2020   Procedure: VIDEO BRONCHOSCOPY WITH ENDOBRONCHIAL ULTRASOUND;  Surgeon: [name], DO;  Location: [place];  Service: Pulmonary;  Laterality:  Bilateral;   VIDEO BRONCHOSCOPY WITH RADIAL ENDOBRONCHIAL ULTRASOUND  10/18/2020   Procedure: VIDEO BRONCHOSCOPY WITH RADIAL ENDOBRONCHIAL ULTRASOUND;  Surgeon: [name], DO;  Location: [place] [facility];  Service: Pulmonary;;   Social History:  reports that she quit smoking about 18 months ago. Her smoking use included cigarettes. She started smoking about 51 years ago. She has a 50.00 pack-year smoking history. She has never used smokeless tobacco. She reports that she does not currently use alcohol. She reports that she does not use drugs.  Allergies  Allergen Reactions   Amoxicillin-Pot Clavulanate Nausea Only and Other (See Comments)    GI upset    Levaquin [Levofloxacin In D5w] Other (See Comments)    Joint problems    Magnesium Hydroxide Other (See Comments)    Welts    Oxycodone Nausea Only   Other Itching    Paper tape    Family History  Problem Relation Age of Onset   Breast cancer Sister    Breast cancer Maternal Aunt    Breast cancer Paternal Aunt     Prior to Admission medications   Medication Sig Start Date End Date Taking? Authorizing Provider  acetaminophen (TYLENOL) 325 MG tablet Take 650 mg by mouth every 6 (six) hours as needed for headache or moderate pain.   Yes [provider]  albuterol (VENTOLIN HFA) 108 (90 Base) MCG/ACT inhaler Inhale 2 puffs into the lungs every 6 (six) hours as needed for shortness of breath. 03/04/21  Yes [provider]  amiodarone (PACERONE) 200 MG tablet Take 1 tablet (200 mg total) by mouth daily. 04/24/21  Yes Camnitz, Will Hassell Done, MD  apixaban (ELIQUIS) 5 MG TABS tablet TAKE 1 TABLET(5 MG) BY MOUTH TWICE DAILY Patient taking differently: Take 5 mg by mouth 2 (two) times daily. 06/12/21  Yes Lelon Perla, MD  Biotin 1000 MCG CHEW Chew 1,000 mg by mouth daily.   Yes [provider]  Black Cohosh (REMIFEMIN PO) Take 1 tablet by mouth 2 (two) times daily.   Yes [provider]   carboxymethylcellulose (REFRESH PLUS) 0.5 % SOLN Place 1 drop into both eyes 3 (three) times daily as needed (for dryness).    Yes [provider]  carisoprodol (SOMA) 350 MG tablet Take 350 mg by mouth 2 (two) times daily. 09/26/17  Yes [provider]  Cholecalciferol (VITAMIN D3 PO) Take 2 capsules by mouth daily.   Yes [provider]  clobetasol ointment (TEMOVATE) 4.76 % Apply 1 application. topically daily as needed (vaginal irritation).   Yes [provider]  clonazePAM (KLONOPIN) 1 MG tablet Take 1 mg by mouth at bedtime.  10/15/17  Yes [provider]  Coenzyme Q10 200 MG capsule Take 400 mg by mouth daily.   Yes [provider]  Cyanocobalamin (B-12 PO) Take 2 capsules by mouth daily.   Yes [provider]  docusate sodium (COLACE) 100 MG capsule Take 1 capsule (100 mg total) by mouth 2 (two) times daily. Patient taking differently: Take 100 mg by mouth daily. 01/03/21  Yes Pokhrel, Laxman, MD  escitalopram (LEXAPRO) 10 MG tablet Take 1 tablet (10 mg total) by mouth daily. Patient taking differently: Take  20 mg by mouth daily. 01/04/21  Yes Pokhrel, Laxman, MD  fluticasone (FLONASE) 50 MCG/ACT nasal spray Place 2 sprays into both nostrils every evening. 10/11/17  Yes [provider]  furosemide (LASIX) 20 MG tablet Take 1 tablet (20 mg total) by mouth daily. Take only if weight goes above 180 lbs. Patient taking differently: Take 20 mg by mouth daily as needed for fluid (weight gain of 3 - 4 lbs overnight). 02/21/21 07/06/21 Yes Arrien, Jimmy Picket, MD  gabapentin (NEURONTIN) 600 MG tablet Take 600 mg by mouth 2 (two) times daily. in the morning and at lunchtime 09/16/17  Yes [provider]  guaiFENesin-dextromethorphan (ROBITUSSIN DM) 100-10 MG/5ML syrup Take 10 mLs by mouth every 4 (four) hours as needed for cough. 01/03/21  Yes Pokhrel, Laxman, MD  hydrOXYzine (ATARAX) 25 MG tablet Take 25 mg by mouth at  bedtime as needed for sleep or anxiety. 03/05/21  Yes [provider]  ipratropium (ATROVENT) 0.02 % nebulizer solution Take 0.5 mg by nebulization 3 (three) times daily. As directed 01/05/21  Yes [provider]  levalbuterol (XOPENEX) 1.25 MG/3ML nebulizer solution Inhale 1.25 mg into the lungs 3 (three) times daily. 01/10/21  Yes [provider]  linaclotide (LINZESS) 145 MCG CAPS capsule Take 1 capsule (145 mcg total) by mouth daily at 4 PM. Patient taking differently: Take 145 mcg by mouth daily as needed (laxative). 10/03/20  Yes Debbe Odea, MD  memantine (NAMENDA) 10 MG tablet Take 1 tablet (10 mg total) by mouth 2 (two) times daily. 02/13/21  Yes Hayden Pedro, PA-C  metoprolol succinate (TOPROL-XL) 25 MG 24 hr tablet Take 25 mg by mouth daily. 06/23/21  Yes [provider]  omeprazole (PRILOSEC) 20 MG capsule Take 20 mg by mouth daily before breakfast.  08/27/17  Yes [provider]  ondansetron (ZOFRAN-ODT) 8 MG disintegrating tablet Take 8 mg by mouth daily as needed for nausea or vomiting. 10/13/20  Yes [provider]  Polyethyl Glycol-Propyl Glycol (SYSTANE) 0.4-0.3 % GEL ophthalmic gel Place 1 Application into both eyes daily as needed (irritation).   Yes [provider]  polyethylene glycol (MIRALAX / GLYCOLAX) packet Take 17 g by mouth in the morning.   Yes [provider]  prochlorperazine (COMPAZINE) 10 MG tablet Take 1 tablet (10 mg total) by mouth every 6 (six) hours as needed for nausea or vomiting. 10/27/20  Yes Curt Bears, MD  rosuvastatin (CRESTOR) 5 MG tablet Take 5 mg by mouth daily. Take 1 Tablet Daily   Yes [provider]  sodium chloride 1 g tablet Take 1 g by mouth 2 (two) times daily. 06/23/21  Yes [provider]  traMADol (ULTRAM) 50 MG tablet Take 50 mg by mouth 3 (three) times daily as needed for severe pain. 10/08/19  Yes [provider]  zaleplon (SONATA) 10  MG capsule Take 10 mg by mouth at bedtime as needed for sleep.   Yes [provider]  lidocaine (XYLOCAINE) 2 % solution Use as directed 15 mLs in the mouth or throat every 6 (six) hours as needed for mouth pain. Patient not taking: Reported on 07/06/2021 12/18/20   Sherwood Gambler, MD  magic mouthwash SOLN Take 5 mLs by mouth 4 (four) times daily as needed for mouth pain. Pt is allergic to Magnesium Patient not taking: Reported on 07/06/2021 11/04/20   Sherol Dade E, PA-C  magnesium oxide (MAG-OX) 400 (240 Mg) MG tablet Take 1 tablet (400 mg total) by mouth daily. Patient  not taking: Reported on 07/06/2021 11/08/20   Heilingoetter, Cassandra L, PA-C  memantine (NAMENDA) 5 MG tablet Begin this prescription the first day of brain radiation. Week 1: take one tablet po qam. Week 2: take one tablet qam and qpm. Week 3: take two tablets qam, and one tablet po q pm. Week 4: take two tablets qam and qpm. Fill subsequent prescription q month. Patient not taking: Reported on 07/06/2021 02/13/21   Hayden Pedro, PA-C  nystatin (MYCOSTATIN) 100000 UNIT/ML suspension Take 5 mLs (500,000 Units total) by mouth 4 (four) times daily. Patient not taking: Reported on 07/06/2021 12/16/20   Tyler Pita, MD    Physical Exam:  Constitutional:      General: She is not in acute distress.    Appearance: Normal appearance. She is well-developed. She is not toxic-appearing.  HENT:     Head: Normocephalic and atraumatic.  Eyes:     General: Lids are normal.     Conjunctiva/sclera: Conjunctivae normal.     Pupils: Pupils are equal, round, and reactive to light.  Neck:     Thyroid: No thyroid mass.     Trachea: No tracheal deviation.  Cardiovascular:     Rate and Rhythm: Normal rate and regular rhythm.     Heart sounds: Normal heart sounds. No murmur heard.    No gallop.  Pulmonary:     Effort: Pulmonary effort is normal. No respiratory distress.     Breath sounds: No stridor.  Diminished  breath sounds bilaterally, on supplemental oxygen.  No wheezing/rhonchi heard Abdominal:     General: There is no distension.     Palpations: Abdomen is soft.     Tenderness: There is no abdominal tenderness. There is no rebound.  Musculoskeletal:        General: No tenderness. Normal range of motion.     Cervical back: Normal range of motion and neck supple.  Skin:    General: Skin is warm and dry.     Findings: No abrasion or rash.  Neurological:     Mental Status: She is alert and oriented to person, place, and time. Mental status is at baseline.    Cranial Nerves: No cranial nerve deficit.     Sensory: No sensory deficit.     Motor: Motor function is intact.  Psychiatric:        Attention and Perception: Attention normal.        Speech: Speech normal.        Behavior: Behavior normal.    Vitals:   07/06/21 1330 07/06/21 1430 07/06/21 1500 07/06/21 1530  BP: (!) 121/96 (!) 104/57 (!) 105/59 99/64  Pulse: 81 77 73 71  Resp: 18  18 18   Temp:      TempSrc:      SpO2: 96% 99% 100% 99%  Weight:      Height:        Data Reviewed:  There are no new results to review at this time.  Assessment and Plan:  -Pneumonia-patient presents with worsening shortness of breath, CT scan of the chest concerning for pneumonia.  She has been started on broad-spectrum antibiotics ceftriaxone and azithromycin.  Check procalcitonin, CRP, follow sputum and blood cultures.  Check Legionella, MRSA.   Paroxysmal Atrial Fibrillation, status post cardioversion-patient is currently rate controlled, continue anticoagulation with Eliquis 5 mg twice daily. She is on amiodarone and metoprolol. Monitor patient on telemetry   Chronic systolic failure. -Currently appears euvolemic. -Continue home dose of Lasix 20 mg  daily. - Discussed importance of daily weights and sodium/fluid restrictions. - Will ultimately need repeat Echo when sinus rhythm is restored to reassess LV function. If EF still down, may  need to consider ischemic evaluation.   Hypertension -Acceptable blood pressures, continue blood pressure lowering medications.   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       Advance Care Planning:   Code Status: Full Code   Consults:   Family Communication:   Severity of Illness: The appropriate patient status for this patient is INPATIENT. Inpatient status is judged to be reasonable and necessary in order to provide the required intensity of service to ensure the patient's safety. The patient's presenting symptoms, physical exam findings, and initial radiographic and laboratory data in the context of their chronic comorbidities is felt to place them at high risk for further clinical deterioration. Furthermore, it is not anticipated that the patient will be medically stable for discharge from the hospital within 2 midnights of admission.   * I certify that at the point of admission it is my clinical judgment that the patient will require inpatient hospital care spanning beyond 2 midnights from the point of admission due to high intensity of service, high risk for further deterioration and high frequency of surveillance required.*  Author: Cristela Felt, MD 07/06/2021 3:44 PM  For on call review [email or web address].

## 2021-07-06 NOTE — ED Provider Notes (Signed)
[facility] DEPT Provider Note   CSN: [id number] Arrival date & time: 07/06/21  1157     History  Chief Complaint  Patient presents with   Shortness of Breath   Chest Pain   cancer patient    Patricia [name] is a 71 y.o. female.  71 year old female presents with right-sided pleuritic chest pain.  Patient has a history of stage IV lung cancer along with CHF.  She notes that she has had increasing dyspnea on exertion.  She normally uses 2 L of oxygen at home per nasal cannula.  Denies any fever.  No anginal type symptoms.  No new lower extremity edema.  She also has a history of A-fib and is on Eliquis which is she says she has been compliant with       Home Medications Prior to Admission medications   Medication Sig Start Date End Date Taking? Authorizing Provider  acetaminophen (TYLENOL) 325 MG tablet Take 650 mg by mouth every 6 (six) hours as needed for headache.    [provider]  albuterol (VENTOLIN HFA) 108 (90 Base) MCG/ACT inhaler Inhale 2 puffs into the lungs every 6 (six) hours as needed for shortness of breath. 03/04/21   [provider]  amiodarone (PACERONE) 200 MG tablet Take 1 tablet (200 mg total) by mouth daily. 04/24/21   Camnitz, Will Hassell Done, MD  apixaban (ELIQUIS) 5 MG TABS tablet TAKE 1 TABLET(5 MG) BY MOUTH TWICE DAILY 06/12/21   Lelon Perla, MD  Black Cohosh (REMIFEMIN PO) Take 1 tablet by mouth 2 (two) times daily.    [provider]  carboxymethylcellulose (REFRESH PLUS) 0.5 % SOLN Place 1 drop into both eyes 3 (three) times daily as needed (for dryness).     [provider]  carisoprodol (SOMA) 350 MG tablet Take 350 mg by mouth 3 (three) times daily.  09/26/17   [provider]  Cholecalciferol (VITAMIN D3 PO) Take 2 capsules by mouth daily.    [provider]  clobetasol ointment (TEMOVATE) 8.50 % Apply 1 application. topically daily as needed (vaginal irritation).     [provider]  clonazePAM (KLONOPIN) 1 MG tablet Take 1 mg by mouth at bedtime.  10/15/17   [provider]  Coenzyme Q10 200 MG capsule Take 400 mg by mouth daily.    [provider]  Cyanocobalamin (B-12 PO) Take 2 capsules by mouth daily.    [provider]  docusate sodium (COLACE) 100 MG capsule Take 1 capsule (100 mg total) by mouth 2 (two) times daily. Patient taking differently: Take 100 mg by mouth 2 (two) times daily as needed for moderate constipation. 01/03/21   Pokhrel, Corrie Mckusick, MD  escitalopram (LEXAPRO) 10 MG tablet Take 1 tablet (10 mg total) by mouth daily. 01/04/21   Pokhrel, Corrie Mckusick, MD  fluticasone (FLONASE) 50 MCG/ACT nasal spray Place 2 sprays into both nostrils daily. 10/11/17   [provider]  furosemide (LASIX) 20 MG tablet Take 1 tablet (20 mg total) by mouth daily. Take only if weight goes above 180 lbs. Patient taking differently: Take 20 mg by mouth daily as needed (weight gain of 3 - 4 lbs overnight). 02/21/21 05/22/21  Arrien, Jimmy Picket, MD  gabapentin (NEURONTIN) 600 MG tablet Take 600 mg by mouth 2 (two) times daily. in the morning and at lunchtime 09/16/17   [provider]  guaiFENesin-dextromethorphan (ROBITUSSIN DM) 100-10 MG/5ML syrup Take 10 mLs by mouth every 4 (four) hours as needed for  cough. 01/03/21   Pokhrel, Corrie Mckusick, MD  hydrOXYzine (ATARAX) 25 MG tablet Take 25 mg by mouth at bedtime as needed for sleep. 03/05/21   [provider]  ipratropium (ATROVENT) 0.02 % nebulizer solution Take 0.5 mg by nebulization 3 (three) times daily. As directed 01/05/21   [provider]  levalbuterol Penne Lash) 1.25 MG/3ML nebulizer solution Inhale 1.25 mg into the lungs 3 (three) times daily. 01/10/21   [provider]  lidocaine (XYLOCAINE) 2 % solution Use as directed 15 mLs in the mouth or throat every 6 (six) hours as needed for mouth pain. 12/18/20   Sherwood Gambler, MD  linaclotide  [facility]) 145 MCG CAPS capsule Take 1 capsule (145 mcg total) by mouth daily at 4 PM. 10/03/20   Debbe Odea, MD  magic mouthwash SOLN Take 5 mLs by mouth 4 (four) times daily as needed for mouth pain. Pt is allergic to Magnesium 11/04/20   Walisiewicz, Kaitlyn E, PA-C  magnesium oxide (MAG-OX) 400 (240 Mg) MG tablet Take 1 tablet (400 mg total) by mouth daily. 11/08/20   Heilingoetter, Cassandra L, PA-C  memantine (NAMENDA) 10 MG tablet Take 1 tablet (10 mg total) by mouth 2 (two) times daily. 02/13/21   Hayden Pedro, PA-C  memantine [facility]) 5 MG tablet Begin this prescription the first day of brain radiation. Week 1: take one tablet po qam. Week 2: take one tablet qam and qpm. Week 3: take two tablets qam, and one tablet po q pm. Week 4: take two tablets qam and qpm. Fill subsequent prescription q month. Patient taking differently: Take 10 mg by mouth 2 (two) times daily. 02/13/21   Hayden Pedro, PA-C  metoprolol succinate (TOPROL-XL) 25 MG 24 hr tablet Take 25 mg by mouth daily. 06/23/21   [provider]  nystatin (MYCOSTATIN) 100000 UNIT/ML suspension Take 5 mLs (500,000 Units total) by mouth 4 (four) times daily. 12/16/20   Tyler Pita, MD  omeprazole (PRILOSEC) 20 MG capsule Take 20 mg by mouth daily before breakfast.  08/27/17   [provider]  ondansetron (ZOFRAN-ODT) 8 MG disintegrating tablet Take 8 mg by mouth daily as needed for nausea. 10/13/20   [provider]  polyethylene glycol (MIRALAX / GLYCOLAX) packet Take 17 g by mouth in the morning.    [provider]  prochlorperazine (COMPAZINE) 10 MG tablet Take 1 tablet (10 mg total) by mouth every 6 (six) hours as needed for nausea or vomiting. 10/27/20   Curt Bears, MD  rosuvastatin (CRESTOR) 5 MG tablet Take 5 mg by mouth daily. Take 1 Tablet Daily    [provider]  sodium chloride 1 g tablet Take 2 g by mouth daily. 06/23/21   [provider]  traMADol  (ULTRAM) 50 MG tablet Take 50 mg by mouth in the morning, at noon, and at bedtime. 10/08/19   [provider]  zaleplon (SONATA) 10 MG capsule Take 10 mg by mouth at bedtime as needed (for interrupted sleep).     [provider]      Allergies    Amoxicillin-pot clavulanate, Levaquin [levofloxacin in d5w], Magnesium hydroxide, Oxycodone, and Other    Review of Systems   Review of Systems  All other systems reviewed and are negative.   Physical Exam Updated Vital Signs BP 108/66   Pulse 71   Temp 98.3 F (36.8 C) (Oral)   Resp 18   Ht 1.702 m (5\' 7" )   Wt 74.4 kg   LMP  (LMP  Unknown)   SpO2 99%   BMI 25.69 kg/m  Physical Exam Vitals and nursing note reviewed.  Constitutional:      General: She is not in acute distress.    Appearance: Normal appearance. She is well-developed. She is not toxic-appearing.  HENT:     Head: Normocephalic and atraumatic.  Eyes:     General: Lids are normal.     Conjunctiva/sclera: Conjunctivae normal.     Pupils: Pupils are equal, round, and reactive to light.  Neck:     Thyroid: No thyroid mass.     Trachea: No tracheal deviation.  Cardiovascular:     Rate and Rhythm: Normal rate and regular rhythm.     Heart sounds: Normal heart sounds. No murmur heard.    No gallop.  Pulmonary:     Effort: Pulmonary effort is normal. No respiratory distress.     Breath sounds: No stridor. Examination of the right-upper field reveals decreased breath sounds. Examination of the right-middle field reveals decreased breath sounds. Examination of the right-lower field reveals decreased breath sounds. Decreased breath sounds present. No wheezing, rhonchi or rales.  Abdominal:     General: There is no distension.     Palpations: Abdomen is soft.     Tenderness: There is no abdominal tenderness. There is no rebound.  Musculoskeletal:        General: No tenderness. Normal range of motion.     Cervical back: Normal range of motion and neck  supple.  Skin:    General: Skin is warm and dry.     Findings: No abrasion or rash.  Neurological:     Mental Status: She is alert and oriented to person, place, and time. Mental status is at baseline.     GCS: GCS eye subscore is 4. GCS verbal subscore is 5. GCS motor subscore is 6.     Cranial Nerves: No cranial nerve deficit.     Sensory: No sensory deficit.     Motor: Motor function is intact.  Psychiatric:        Attention and Perception: Attention normal.        Speech: Speech normal.        Behavior: Behavior normal.     ED Results / Procedures / Treatments   Labs (all labs ordered are listed, but only abnormal results are displayed) Labs Reviewed  BASIC METABOLIC PANEL  CBC  BRAIN NATRIURETIC PEPTIDE  TROPONIN I (HIGH SENSITIVITY)    EKG EKG Interpretation  Date/Time:  Thursday July 06 2021 12:06:20 EDT Ventricular Rate:  75 PR Interval:  218 QRS Duration: 92 QT Interval:  418 QTC Calculation: 467 R Axis:   80 Text Interpretation: Sinus rhythm Borderline prolonged PR interval Low voltage, precordial leads Borderline T abnormalities, anterior leads Confirmed by Lacretia Leigh ([id number]) on 07/06/2021 12:25:35 PM  Radiology DG Chest 2 View  Result Date: 07/06/2021 CLINICAL DATA:  Productive cough and dyspnea on exertion EXAM: CHEST - 2 VIEW COMPARISON:  Radiograph 02/11/2021, chest CT 05/29/2021 FINDINGS: Unchanged cardiomediastinal silhouette. There is a persistent small left pleural effusion with adjacent left basilar consolidation. No pneumothorax. Right lung is clear. No acute osseous abnormality. IMPRESSION: Persistent small left pleural effusion and adjacent left basilar consolidation, which could be atelectasis or infection. Effusion is similar in size to recent chest CT. Electronically Signed   By: Maurine Simmering M.D.   On: 07/06/2021 12:30   DG Chest 2 View  Result Date: 07/06/2021 CLINICAL DATA:  chest pain and SOB EXAM:  CHEST - 2 VIEW COMPARISON:  Radiograph  07/05/2021 FINDINGS: Unchanged cardiomediastinal silhouette. There is a persistent small left pleural effusion and adjacent left basilar consolidation. Right lung is clear. No pneumothorax. No acute osseous abnormality. IMPRESSION: Persistent small left pleural effusion and adjacent left basilar consolidation, which could be atelectasis or infection. Electronically Signed   By: Maurine Simmering M.D.   On: 07/06/2021 12:29    Procedures Procedures    Medications Ordered in ED Medications  morphine (PF) 4 MG/ML injection 6 mg (has no administration in time range)  albuterol (PROVENTIL) (2.5 MG/3ML) 0.083% nebulizer solution 5 mg (has no administration in time range)    ED Course/ Medical Decision Making/ A&P                           Medical Decision Making Amount and/or Complexity of Data Reviewed Labs: ordered. Radiology: ordered.  Risk Prescription drug management.   Patient is EKG per my interpretation shows normal sinus rhythm.  Chest x-ray per my interpretation shows small left-sided pleural effusion.  Questionable infection.  Concern for possible pulmonary embolus given patient pleuritic pain on her right side.  CT of the chest per my review and interpretation shows concern for bilateral pneumonia.  No evidence of pulmonary embolus.  Blood cultures and lactate obtained.  Patient started on IV antibiotics.  Patient will require admission          Final Clinical Impression(s) / ED Diagnoses Final diagnoses:  None    Rx / DC Orders ED Discharge Orders     None         Lacretia Leigh, MD 07/06/21 1434

## 2021-07-06 NOTE — ED Triage Notes (Signed)
Patient states she has been having intermittent chest pain that radiates into the right shoulder x 1 week.  Patient is on home O2 2Lmin via [place]. Patient states she woke this AM with SOB and her sats were 83% with O2 2L/min via [place].  Patient states a history of lung cancer

## 2021-07-06 NOTE — Plan of Care (Signed)
  Problem: Education: Goal: Knowledge of General Education information will improve Description: Including pain rating scale, medication(s)/side effects and non-pharmacologic comfort measures Outcome: Progressing   Problem: Respiratory: Goal: Ability to maintain adequate ventilation will improve Outcome: Progressing

## 2021-07-07 ENCOUNTER — Other Ambulatory Visit: Payer: Self-pay | Admitting: Physician Assistant

## 2021-07-07 ENCOUNTER — Telehealth: Payer: Self-pay | Admitting: Internal Medicine

## 2021-07-07 DIAGNOSIS — E440 Moderate protein-calorie malnutrition: Secondary | ICD-10-CM | POA: Diagnosis not present

## 2021-07-07 LAB — CBC WITH DIFFERENTIAL/PLATELET
Abs Immature Granulocytes: 0.01 K/uL (ref 0.00–0.07)
Basophils Absolute: 0 K/uL (ref 0.0–0.1)
Basophils Relative: 0 %
Eosinophils Absolute: 0.2 K/uL (ref 0.0–0.5)
Eosinophils Relative: 4 %
HCT: 27.4 % — ABNORMAL LOW (ref 36.0–46.0)
Hemoglobin: 9.4 g/dL — ABNORMAL LOW (ref 12.0–15.0)
Immature Granulocytes: 0 %
Lymphocytes Relative: 6 %
Lymphs Abs: 0.3 K/uL — ABNORMAL LOW (ref 0.7–4.0)
MCH: 38.7 pg — ABNORMAL HIGH (ref 26.0–34.0)
MCHC: 34.3 g/dL (ref 30.0–36.0)
MCV: 112.8 fL — ABNORMAL HIGH (ref 80.0–100.0)
Monocytes Absolute: 0.4 K/uL (ref 0.1–1.0)
Monocytes Relative: 10 %
Neutro Abs: 3.2 K/uL (ref 1.7–7.7)
Neutrophils Relative %: 80 %
Platelets: 106 K/uL — ABNORMAL LOW (ref 150–400)
RBC: 2.43 MIL/uL — ABNORMAL LOW (ref 3.87–5.11)
RDW: 13.8 % (ref 11.5–15.5)
WBC: 4.1 K/uL (ref 4.0–10.5)
nRBC: 0 % (ref 0.0–0.2)

## 2021-07-07 LAB — COMPREHENSIVE METABOLIC PANEL
ALT: 31 U/L (ref 0–44)
AST: 27 U/L (ref 15–41)
Albumin: 3.1 g/dL — ABNORMAL LOW (ref 3.5–5.0)
Alkaline Phosphatase: 79 U/L (ref 38–126)
Anion gap: 9 (ref 5–15)
BUN: 9 mg/dL (ref 8–23)
CO2: 30 mmol/L (ref 22–32)
Calcium: 8.6 mg/dL — ABNORMAL LOW (ref 8.9–10.3)
Chloride: 92 mmol/L — ABNORMAL LOW (ref 98–111)
Creatinine, Ser: 0.91 mg/dL (ref 0.44–1.00)
GFR, Estimated: >60 mL/min (ref >60)
Glucose, Bld: 89 mg/dL (ref 70–99)
Potassium: 4.1 mmol/L (ref 3.5–5.1)
Sodium: 131 mmol/L — ABNORMAL LOW (ref 135–145)
Total Bilirubin: 0.4 mg/dL (ref 0.3–1.2)
Total Protein: 6 g/dL — ABNORMAL LOW (ref 6.5–8.1)

## 2021-07-07 LAB — PROCALCITONIN: Procalcitonin: <0.1 ng/mL

## 2021-07-07 MED ORDER — ADULT MULTIVITAMIN W/MINERALS CH
1.0000 | ORAL_TABLET | Freq: Every day | ORAL | Status: DC
  Administered 2021-07-07 – 2021-07-09 (×3): 1 via ORAL
  Filled 2021-07-07 (×3): qty 1

## 2021-07-07 MED ORDER — BOOST / RESOURCE BREEZE PO LIQD CUSTOM
1.0000 | Freq: Three times a day (TID) | ORAL | Status: DC
  Administered 2021-07-07 – 2021-07-09 (×4): 1 via ORAL

## 2021-07-07 MED ORDER — DOCUSATE SODIUM 100 MG PO CAPS
100.0000 mg | ORAL_CAPSULE | Freq: Two times a day (BID) | ORAL | Status: DC
  Administered 2021-07-07 – 2021-07-09 (×4): 100 mg via ORAL
  Filled 2021-07-07 (×4): qty 1

## 2021-07-07 MED ORDER — GUAIFENESIN ER 600 MG PO TB12
1200.0000 mg | ORAL_TABLET | Freq: Two times a day (BID) | ORAL | Status: DC
  Administered 2021-07-07 – 2021-07-09 (×4): 1200 mg via ORAL
  Filled 2021-07-07 (×4): qty 2

## 2021-07-07 MED ORDER — ENSURE ENLIVE PO LIQD: 237.0000 mL | ORAL | Status: DC

## 2021-07-07 MED ORDER — RESOURCE INSTANT PROTEIN PO PWD PACKET
1.0000 | Freq: Three times a day (TID) | ORAL | Status: DC
  Administered 2021-07-07 – 2021-07-08 (×2): 6 g via ORAL
  Filled 2021-07-07 (×7): qty 6

## 2021-07-07 NOTE — Progress Notes (Signed)
PROGRESS NOTE    Patricia [name]  ZWC:[id number] DOB: [birth year] DOA: 07/06/2021 PCP: [name], MD   Brief Narrative:  HPI per Dr. Vira Agar Dibia on 07/06/21 Patricia [name] is a 71 y.o. 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  Patient reports having pleuritic chest pain has been ongoing for the past one week.  Has a cough that is nonproductive.  This morning her shortness of breath appeared to be worse, she noted that her oxygen saturations were about 83% on her 2 L of nasal cannula.  Reports having intermittent chest pain. Patient denies fevers, chills, nausea, vomiting. At The ER chest x-ray showed small left pleural effusion questionable infection.  CT of the chest obtained showed no evidence of PE however showed underlying scarring and superimposed pneumonia.  Patient is being admitted for management of pneumonia.   **Interim History She feels a little bit better and is coughing up some sputum but states it is clear and "sticky".  We will continue to monitor and repeat chest x-ray in a.m.  Assessment and Plan: No notes have been filed under this hospital service. Service: Hospitalist  Left [place] Community Acquired PNA  -Patient presents with worsening shortness of breath, CT scan of the chest concerning for pneumonia.   -She has been started on Antibiotics ceftriaxone and azithromycin and will continue.   -Check procalcitonin, CRP, follow sputum and blood cultures. -Check Legionella, MRSA. -Repeat CXR in the AM  -Will add Flutter Valve, Incentive Spirometry, and Guaifenesin 1200 mg po BID -C/w Albuterol Neb 2.5 mg Neb TID -Repeat chest x-ray in the a.m. and will need an  amatory home O2 screen prior to discharge   Paroxysmal Atrial Fibrillation, status post cardioversion -patient is currently rate controlled, continue anticoagulation with Eliquis 5 mg twice daily. She is on amiodarone and metoprolol XL 25 mg po Daily  Monitor patient on telemetry   Chronic systolic failure. -Currently appears euvolemic. -Continue home dose of Lasix 20 mg daily. -Discussed importance of daily weights and sodium/fluid restrictions. - Will ultimately need repeat Echo when sinus rhythm is restored to reassess LV function. If EF still down, may need to consider ischemic evaluation.   Hypertension -Acceptable blood pressures, continue blood pressure lowering medications.   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  Constipation -Start Colace -She does not want the Linzess   Hyponatremia -Mild with a sodium of 131 -Continue monitor and trend and repeat CMP in a.m. -May need to hold Lasix if continues to drop  Macrocytic anemia -Patient's hemoglobin/hematocrit is now 9.4/27.4 with an MCV of 112.8 -Check anemia panel in a.m. -Continue to monitor for signs or symptoms of bleeding; no overt bleeding noted -Repeat CBC in a.m.  Thrombocytopenia -Patient's platelet count is now 106 -Continue monitor for signs and symptoms bleeding; no overt bleeding noted -Repeat CBC in a.m.  DVT prophylaxis:  apixaban (ELIQUIS) tablet 5 mg    Code Status: Full Code Family Communication: No family currently at bedside  Disposition Plan:  Level of care: Telemetry Status is: Inpatient Remains inpatient appropriate because: Needs further treatment of her pneumonia   Consultants:  None  Procedures:  None  Antimicrobials:  Anti-infectives (From admission, onward)    Start     Dose/Rate Route Frequency Ordered Stop   07/06/21 1445  cefTRIAXone  (ROCEPHIN) 1 g in sodium chloride 0.9 % 100 mL IVPB        1 g 200 mL/hr over 30 Minutes Intravenous Every 24 hours 07/06/21 1431     07/06/21 1445  azithromycin (ZITHROMAX) 500 mg in sodium chloride 0.9 % 250 mL IVPB        500 mg 250 mL/hr over 60 Minutes Intravenous Every 24 hours 07/06/21 1431         Subjective: Seen and examined at bedside and thinks that she is doing a little bit better today but still having a productive cough.  No nausea or vomiting.  Denies any lightheadedness or dizziness.  No other concerns or complaints at this time.  Objective: Vitals:   07/07/21 0619 07/07/21 0839 07/07/21 1011 07/07/21 1348  BP: (!) 104/58  105/60 122/63  Pulse: 68  78 72  Resp: 16  20 20   Temp: 98.2 F (36.8 C)  98.3 F (36.8 C) (!) 97.5 F (36.4 C)  TempSrc: Oral  Oral Oral  SpO2: 97% 95% 93% 98%  Weight:      Height:        Intake/Output Summary (Last 24 hours) at 07/07/2021 1719 Last data filed at 07/07/2021 0900 Gross per 24 hour  Intake 180 ml  Output 1850 ml  Net -1670 ml   Filed Weights   07/06/21 1205 07/07/21 0236  Weight: 74.4 kg 73.5 kg   Examination: Physical Exam:  Constitutional: WN/WD slightly overweight chronically ill-appearing Caucasian female Respiratory: Slightly diminished to auscultation bilaterally with coarse breath sounds, no wheezing, rales, rhonchi or crackles. Normal respiratory effort and patient is not tachypenic. No accessory muscle use.  Unlabored breathing Cardiovascular: RRR, no murmurs / rubs / gallops. S1 and S2 auscultated.  Abdomen: Soft, non-tender, mildly distended.  Bowel sounds positive.  GU: Deferred. Musculoskeletal: No clubbing / cyanosis of digits/nails. No joint deformity upper and lower extremities.  Skin: No rashes, lesions, ulcers on limited skin evaluation. No induration; Warm and dry.  Neurologic: CN 2-12 grossly intact with no focal deficits. Romberg sign and cerebellar reflexes not assessed.  Psychiatric: Normal  judgment and insight. Alert and oriented x 3. Normal mood and appropriate affect.   Data Reviewed: I have personally reviewed following labs and imaging studies  CBC: Recent Labs  Lab 07/06/21 1207 07/07/21 0343  WBC 5.6 4.1  NEUTROABS  --  3.2  HGB 11.0* 9.4*  HCT 31.7* 27.4*  MCV 112.4* 112.8*  PLT 107* 568*   Basic Metabolic Panel: Recent Labs  Lab 07/06/21 1207 07/07/21 0348  NA 134* 131*  K 4.9 4.1  CL 96* 92*  CO2 30 30  GLUCOSE 102* 89  BUN 12 9  CREATININE 0.83 0.91  CALCIUM 9.1 8.6*   GFR: Estimated Creatinine Clearance: 54.3 mL/min (by C-G formula based on SCr of 0.91 mg/dL). Liver Function Tests: Recent Labs  Lab 07/06/21 1425 07/07/21 0348  AST 26 27  ALT 32 31  ALKPHOS 78 79  BILITOT 0.6 0.4  PROT 5.8* 6.0*  ALBUMIN 3.1* 3.1*   Recent Labs  Lab 07/06/21 1425  LIPASE 20   No results for input(s): "AMMONIA" in the last 168 hours. Coagulation Profile: No results for input(s): "INR", "PROTIME" in the last 168 hours. Cardiac Enzymes: No results for input(s): "CKTOTAL", "CKMB", "CKMBINDEX", "TROPONINI" in the last  168 hours. BNP (last 3 results) No results for input(s): "PROBNP" in the last 8760 hours. HbA1C: No results for input(s): "HGBA1C" in the last 72 hours. CBG: No results for input(s): "GLUCAP" in the last 168 hours. Lipid Profile: No results for input(s): "CHOL", "HDL", "LDLCALC", "TRIG", "CHOLHDL", "LDLDIRECT" in the last 72 hours. Thyroid Function Tests: No results for input(s): "TSH", "T4TOTAL", "FREET4", "T3FREE", "THYROIDAB" in the last 72 hours. Anemia Panel: No results for input(s): "VITAMINB12", "FOLATE", "FERRITIN", "TIBC", "IRON", "RETICCTPCT" in the last 72 hours. Sepsis Labs: Recent Labs  Lab 07/06/21 1510 07/06/21 1857 07/07/21 0348  PROCALCITON  --  <0.10 <0.10  LATICACIDVEN 0.8 0.8  --     Recent Results (from the past 240 hour(s))  Culture, blood (Routine X 2) w Reflex to ID Panel     Status: None  (Preliminary result)   Collection Time: 07/06/21  3:10 PM   Specimen: BLOOD  Result Value Ref Range Status   Specimen Description   Final    BLOOD LEFT ANTECUBITAL Performed at [facility], [place] [address], [place], [place] [address]    Special Requests   Final    BOTTLES DRAWN AEROBIC AND ANAEROBIC Blood Culture results may not be optimal due to an excessive volume of blood received in culture bottles Performed at [facility] [address], [place], [place] [address]    Culture   Final    NO GROWTH < 24 HOURS Performed at [facility] [address], [place], [place] [address]    Report Status PENDING  Incomplete  Culture, blood (Routine X 2) w Reflex to ID Panel     Status: None (Preliminary result)   Collection Time: 07/06/21  3:20 PM   Specimen: BLOOD  Result Value Ref Range Status   Specimen Description   Final    BLOOD RIGHT ANTECUBITAL Performed at [facility] [address] [place], [place] [address]    Special Requests   Final    BOTTLES DRAWN AEROBIC AND ANAEROBIC Blood Culture results may not be optimal due to an excessive volume of blood received in culture bottles Performed at [facility] [address] [place], [place] [address]    Culture   Final    NO GROWTH < 24 HOURS Performed at [facility] [address], [place], [place] [address]    Report Status PENDING  Incomplete  SARS Coronavirus 2 by RT PCR (hospital order, performed in [facility] hospital lab) *cepheid single result test*     Status: None   Collection Time: 07/06/21  3:25 PM   Specimen: Nasal Swab  Result Value Ref Range Status   SARS Coronavirus 2 by RT PCR NEGATIVE NEGATIVE Final    Comment: (NOTE) SARS-CoV-2 target nucleic acids are NOT DETECTED.  The SARS-CoV-2 RNA is generally detectable in upper and lower respiratory specimens during the acute phase of infection. The lowest concentration of  SARS-CoV-2 viral copies this assay can detect is 250 copies / mL. A negative result does not preclude SARS-CoV-2 infection and should not be used as the sole basis for treatment or other patient management decisions.  A negative result may occur with improper specimen collection / handling, submission of specimen other than nasopharyngeal swab, presence of viral mutation(s) within the areas targeted by this assay, and inadequate number of viral copies (<250 copies / mL). A negative result must be combined with clinical observations, patient history, and epidemiological information.  Fact Sheet for Patients:   https://www.patel.info/  Fact Sheet for Healthcare Providers: https://hall.com/  This test is not yet approved or  cleared by the [place] FDA and has been authorized for detection and/or diagnosis of SARS-CoV-2 by FDA under an Emergency Use Authorization (EUA).  This EUA will remain in effect (meaning this test can be used) for the duration of the COVID-19 declaration under Section 564(b)(1) of the Act, 21 U.S.C. section 360bbb-3(b)(1), unless the authorization is terminated or revoked sooner.  Performed at [facility], [place] [address], [place], [place] [address]      Radiology Studies: CT Angio Chest PE W/Cm &/Or Wo Cm  Result Date: 07/06/2021 CLINICAL DATA:  Shortness of breath, chest pain EXAM: CT ANGIOGRAPHY CHEST WITH CONTRAST TECHNIQUE: Multidetector CT imaging of the chest was performed using the standard protocol during bolus administration of intravenous contrast. Multiplanar CT image reconstructions and MIPs were obtained to evaluate the vascular anatomy. RADIATION DOSE REDUCTION: This exam was performed according to the departmental dose-optimization program which includes automated exposure control, adjustment of the mA and/or kV according to patient size and/or use of iterative reconstruction  technique. CONTRAST:  120mL OMNIPAQUE IOHEXOL 350 MG/ML SOLN COMPARISON:  05/29/2021 FINDINGS: Cardiovascular: There is homogeneous enhancement in thoracic aorta. Coronary artery calcifications are seen. Left vertebral artery is arising from the aortic arch. There are no intraluminal filling defects in the central pulmonary artery branches. Evaluation of small peripheral pulmonary artery branches is limited by infiltrates in the left mid and left lower lung fields. Mediastinum/Nodes: No significant lymphadenopathy seen. Lungs/Pleura: There is moderate left pleural effusion with interval increase. There are patchy infiltrates in the anterior left mid lung fields and in the left lower lobe with interval worsening. Increased interstitial markings with subpleural honeycombing is seen in the periphery of right lower lung fields. There is minimal right pleural effusion. Centrilobular emphysema is seen. Upper Abdomen: There is increased density in the dependent portion of gallbladder lumen. Musculoskeletal: There is interval mild decrease in height of upper endplate of body of T7 vertebra. Alignment of posterior margins of vertebral bodies is unremarkable. Review of the MIP images confirms the above findings. IMPRESSION: There is no evidence of pulmonary artery embolism. There is no evidence of thoracic aortic dissection. Scattered coronary artery calcifications are seen. Moderate left pleural effusion with interval increase in size. There are patchy infiltrates in the lingula and left lower lobe with interval increase. Findings suggest possible underlying scarring and superimposed pneumonia. There is increase in interstitial markings in the periphery of right lower lung fields with subpleural honeycombing. This may suggest interstitial lung disease with possible superimposed interstitial pneumonia. Centrilobular emphysema is seen. There is mild interval compression in the upper endplate of body of T7 vertebra suggesting  recent mild compression fracture. Alignment of posterior margins of vertebral bodies is unremarkable. Increased density in the dependent portion of gallbladder lumen may suggest presence of sludge and stones. Electronically Signed   By: Elmer Picker M.D.   On: 07/06/2021 14:20   DG Chest 2 View  Result Date: 07/06/2021 CLINICAL DATA:  chest pain and SOB EXAM: CHEST - 2 VIEW COMPARISON:  Radiograph 07/05/2021 FINDINGS: Unchanged cardiomediastinal silhouette. There is a persistent small left pleural effusion and adjacent left basilar consolidation. Right lung is clear. No pneumothorax. No acute osseous abnormality. IMPRESSION: Persistent small left pleural effusion and adjacent left basilar consolidation, which could be atelectasis or infection. Electronically Signed   By: Maurine Simmering M.D.   On:  07/06/2021 12:29     Scheduled Meds:  albuterol  2.5 mg Nebulization TID   amiodarone  200 mg Oral Daily   apixaban  5 mg Oral BID   carisoprodol  350 mg Oral BID   cholecalciferol  1,000 Units Oral Daily   clonazePAM  1 mg Oral QHS   docusate sodium  100 mg Oral BID   escitalopram  10 mg Oral Daily   feeding supplement  1 Container Oral TID BM   feeding supplement  237 mL Oral Q24H   fluticasone  2 spray Each Nare QPM   furosemide  20 mg Oral Daily   gabapentin  600 mg Oral BID   linaclotide  145 mcg Oral q1600   memantine  10 mg Oral BID   metoprolol succinate  25 mg Oral Daily   multivitamin with minerals  1 tablet Oral Daily   pantoprazole  40 mg Oral Daily   protein supplement  1 Scoop Oral TID WC   rosuvastatin  5 mg Oral Daily   Continuous Infusions:  azithromycin 500 mg (07/07/21 1703)   cefTRIAXone (ROCEPHIN)  IV 1 g (07/07/21 1609)    LOS: 1 day   Raiford Noble, DO [facility] Available via Epic secure chat 7am-7pm After these hours, please refer to coverage provider listed on [email or web address] 07/07/2021, 5:19 PM

## 2021-07-07 NOTE — Evaluation (Signed)
Physical Therapy Evaluation Patient Details Name: Patricia [name] MRN: [id number] DOB: 1950-02-25 Today's Date: 07/07/2021  History of Present Illness  Patient is 71 y.o. female who presented to the ED for SOB on 07/06/21. chest x-ray showed small left pleural effusion questionable infection.  CT of the chest obtained showed no evidence of PE however showed underlying scarring and superimposed pneumonia.  Patient is being admitted for management of pneumonia. PMH significant of chronic respiratory failure on 2 L of nasal cannula chronic CHF with mildly reduced EF of 45-50%, persistent atrial fibrillation/flutter on Eliquis,hypertension, hyperlipidemia, small cell lung cancer s/p chemotherapy and radiation.    Clinical Impression  Patricia [name] is 71 y.o. female admitted with above HPI and diagnosis. Patient is currently limited by functional impairments below (see PT problem list). Patient lives alone and is independent at baseline. Patient will benefit from continued skilled PT interventions to address impairments and progress independence with mobility, recommending HHPT to maintain and improve functional endurance, balance, and mobility. Acute PT will follow and progress as able.        Recommendations for follow up therapy are one component of a multi-disciplinary discharge planning process, led by the attending physician.  Recommendations may be updated based on patient status, additional functional criteria and insurance authorization.  PT Recommendation   Follow Up Recommendations Home health PT Filed 07/07/2021 1001  Assistance recommended at discharge Intermittent Supervision/Assistance Filed 07/07/2021 1001  Patient can return home with the following A little help with walking and/or transfers, A little help with bathing/dressing/bathroom, Assistance with cooking/housework, Direct supervision/assist for medications management, Assist for transportation, Help with stairs or ramp for  entrance Filed 07/07/2021 1001  Functional Status Assessment Patient has had a recent decline in their functional status and demonstrates the ability to make significant improvements in function in a reasonable and predictable amount of time. Filed 07/07/2021 1001  PT equipment None recommended by PT Filed 07/07/2021 1001    Mobility  Bed Mobility Overal bed mobility: Needs Assistance Bed Mobility: Supine to Sit     Supine to sit: Min guard, HOB elevated     General bed mobility comments: slightly elevated HOB, pt taking extra time.    Transfers Overall transfer level: Needs assistance Equipment used: Rolling walker (2 wheels) Transfers: Sit to/from Stand Sit to Stand: Min assist           General transfer comment: cues for hand placement, pt initiated power up and min assit to complete rise. pt steady once standing. No reach back to sit, pt tending to plop back to recliner.    Ambulation/Gait Ambulation/Gait assistance: Min assist, Min guard Gait Distance (Feet): 100 Feet Assistive device: Rolling walker (2 wheels) Gait Pattern/deviations: Step-through pattern, Decreased stride length, Shuffle Gait velocity: decr     General Gait Details: overall low foot clearance, hip/knee flexion limited due to weakness  Stairs            Wheelchair Mobility    Modified Rankin (Stroke Patients Only)       Balance Overall balance assessment: Mild deficits observed, not formally tested, Needs assistance Sitting-balance support: Feet supported Sitting balance-Leahy Scale: Good     Standing balance support: Reliant on assistive device for balance, During functional activity, Bilateral upper extremity supported Standing balance-Leahy Scale: Fair                               Pertinent Vitals/Pain Pain Assessment Pain Assessment:  No/denies pain     07/07/21 1001  Home Living  Family/patient expects to be discharged to: Private residence  Living  Arrangements Spouse/significant other  Available Help at Discharge Available 24 hours/day  Type of [place] to enter  Entrance Stairs-Number of Steps 2  [place] One level  Bathroom Biomedical scientist Yes  Home Arboriculturist (2 wheels);Hand held shower head  Additional Comments use the vanity by toilet  Prior Function  Prior Level of Function  Independent/Modified Independent  Mobility Comments using RW for household mobility since Decemebr    Hand Dominance    Right    Extremity/Trunk Assessment   UE:  Overall WFL for tasks assessed; LE:  Overall WFL for tasks assessed;          Communication      Cognition Arousal/Alertness: Awake/alert Behavior During Therapy: WFL for tasks assessed/performed Overall Cognitive Status: Within Functional Limits for tasks assessed                                 General Comments: I haven't been doing much of anything besides got ot therapy, chemo, radiation, and I was getting HHPT 2x/wk and now I am getting 1x/wk.        General Comments      Exercises General Exercises - Lower Extremity Ankle Circles/Pumps: AROM, Both, 20 reps Quad Sets: AROM, Both, 10 reps Hip Flexion/Marching: AROM, Both, 10 reps      07/07/21 1001  PT - End of Session  Equipment Utilized During Treatment Gait belt;Oxygen  Activity Tolerance Patient tolerated treatment well  Patient left in chair;with call bell/phone within reach;with chair alarm set  Nurse Communication Mobility status  PT Assessment  PT Recommendation/Assessment Patient needs continued PT services  PT Visit Diagnosis Muscle weakness (generalized) (M62.81);Other abnormalities of gait and mobility (R26.89);Difficulty in walking, not elsewhere classified (R26.2)  PT Problem List Decreased strength;Decreased activity  tolerance;Decreased balance;Decreased mobility;Decreased knowledge of use of DME;Decreased safety awareness;Decreased knowledge of precautions  PT Plan  PT Frequency (ACUTE ONLY) Min 3X/week  PT Treatment/Interventions (ACUTE ONLY) DME instruction;Gait training;Stair training;Functional mobility training;Therapeutic activities;Therapeutic exercise;Balance training;Patient/family education  AM-PAC PT "6 Clicks" Mobility Outcome Measure (Version 2)  Help needed turning from your back to your side while in a flat bed without using bedrails? 3  Help needed moving from lying on your back to sitting on the side of a flat bed without using bedrails? 3  Help needed moving to and from a bed to a chair (including a wheelchair)? 3  Help needed standing up from a chair using your arms (e.g., wheelchair or bedside chair)? 3  Help needed to walk in hospital room? 3  Help needed climbing 3-5 steps with a railing?  3  6 Click Score 18  Consider Recommendation of Discharge To: Home with [facility]  Progressive Mobility  What is the highest level of mobility based on the progressive mobility assessment? Level 5 (Walks with assist in room/hall) - Balance while stepping forward/back and can walk in room with assist - Complete  Activity Ambulated with assistance in hallway  PT Recommendation  Follow Up Recommendations Home health PT  Assistance recommended at discharge Intermittent Supervision/Assistance  Patient can return home with the following A little help with walking and/or transfers;A little help with bathing/dressing/bathroom;Assistance with cooking/housework;Direct supervision/assist for medications management;Assist  for transportation;Help with stairs or ramp for entrance  Functional Status Assessment Patient has had a recent decline in their functional status and demonstrates the ability to make significant improvements in function in a reasonable and predictable amount of time.  PT equipment None recommended by  PT  Individuals Consulted  Consulted and Agree with Results and Recommendations Patient  Acute Rehab PT Goals  Patient Stated Goal get stronger and get home  PT Goal Formulation With patient  Time For Goal Achievement 07/21/21  Potential to Achieve Goals Good  PT Time Calculation  PT Start Time (ACUTE ONLY) 0958  PT Stop Time (ACUTE ONLY) 1034  PT Time Calculation (min) (ACUTE ONLY) 36 min  PT General Charges  $$ ACUTE PT VISIT 1 Visit  PT Evaluation  $PT Eval Low Complexity 1 Low  PT Treatments  $Therapeutic Exercise 8-22 mins     Verner Mould, DPT Acute Rehabilitation Services Office [phone] Pager [phone]  07/07/21 2:34 PM

## 2021-07-07 NOTE — Progress Notes (Signed)
Initial Nutrition Assessment  DOCUMENTATION CODES:   Non-severe (moderate) malnutrition in context of chronic illness  INTERVENTION:  Liberalize diet from a Heart Healthy to a 2 gram sodium diet to provide widest variety of menu options to enhance nutritional adequacy Ensure Enlive po once daily, each supplement provides 350 kcal and 20 grams of protein (pt prefers strawberry) Boost Breeze po TID, each supplement provides 250 kcal and 9 grams of protein Beneprotein TID with meals, each supplement provides 25 kcal and 6 grams of protein MVI with minerals daily "High Calorie, High Protein Nutrition Therapy" handout added to AVS  NUTRITION DIAGNOSIS:   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  GOAL:   Patient will meet greater than or equal to 90% of their needs  MONITOR:   PO intake, Supplement acceptance, Labs, Weight trends  REASON FOR ASSESSMENT:   Malnutrition Screening Tool    ASSESSMENT:   Pt admitted with SOB and chest pain, found to have PNA. PMH significant for chronic respiratory failure, CHF, afib/aflutter, HTN, HLD, SCLC s/p chemo and radiation.  Pt is very pleasant sitting in chair. Daughter at bedside. Reports having a significantly decreased appetite for >6 months. At home, she recalls eating cottage cheese with peaches around 10-11AM. Supper includes meals brought by neighbors or family. She has Premier Protein shakes at home but does not drink them often. Her beverage intake includes ~20-30oz of water with Mio flavoring and one cup of coffee in the morning. She states that since beginning chemo, she has lost her taste for foods and does not enjoy milk containing products much anymore. She also endorses long time throat dryness which her daughter was able to find a mouth spray that helps with this. She is agreeable to trying a Strawberry Ensure as well as [employer]. Spoke with her about adding Beneprotein as well as she can add this to her beverages throughout the day for added nutrition.   Meal completions: 06/29: 50%-dinner  She reports that her wt during Christmas was 180 lbs and since then has lost about 40 lbs. However, she also states that some of this wt loss was attributed to volume status as she required diuresis during her prior admission in February.  Reviewed wt history. Between 01/06/21-07/07/21, pt appears she has had a 12.9% wt loss which is significant for time frame.  Medications: Vitamin D, lasix, protonix, IV abx  Labs: sodium 131, CRP 3.5  NUTRITION - FOCUSED PHYSICAL EXAM:  Flowsheet Row Most Recent Value  Orbital Region No depletion  Upper Arm Region Moderate depletion  Thoracic and Lumbar Region No depletion  Buccal Region No depletion  Temple Region Mild depletion  Clavicle Bone Region No depletion  Clavicle and Acromion Bone Region Mild depletion  Scapular Bone Region Mild depletion  Dorsal Hand No depletion  Patellar Region Mild depletion  Anterior Thigh Region Mild depletion  Posterior Calf Region Moderate depletion  Edema (RD Assessment) None  Hair Other (Comment)  [hair loss d/t chemo]  Eyes Reviewed  Mouth Reviewed  Skin Reviewed  Nails Reviewed       Diet Order:   Diet Order             Diet 2 gram sodium Room service appropriate? Yes; Fluid consistency: Thin  Diet effective now                   EDUCATION NEEDS:   Education needs  have been addressed  Skin:  Skin Assessment: Reviewed RN Assessment  Last BM:  6/28  Height:   Ht Readings from Last 1 Encounters:  07/06/21 5\' 7"  (1.702 m)    Weight:   Wt Readings from Last 1 Encounters:  07/07/21 73.5 kg   BMI:  Body mass index is 25.38 kg/m.  Estimated Nutritional Needs:   Kcal:  1800-2000  Protein:  90-105g  Fluid:  >/=1.8L  Clayborne Dana, RDN, LDN Clinical Nutrition

## 2021-07-07 NOTE — Plan of Care (Signed)
  Problem: Education: Goal: Knowledge of General Education information will improve Description: Including pain rating scale, medication(s)/side effects and non-pharmacologic comfort measures Outcome: Progressing   Problem: Activity: Goal: Ability to tolerate increased activity will improve Outcome: Progressing   Problem: Clinical Measurements: Goal: Ability to maintain a body temperature in the normal range will improve Outcome: Progressing   Problem: Respiratory: Goal: Ability to maintain adequate ventilation will improve Outcome: Progressing Goal: Ability to maintain a clear airway will improve Outcome: Progressing

## 2021-07-07 NOTE — Discharge Instructions (Signed)

## 2021-07-07 NOTE — Telephone Encounter (Signed)
Called patient regarding upcoming August appointment, patient has been called and voicemail was left.

## 2021-07-08 ENCOUNTER — Inpatient Hospital Stay (HOSPITAL_COMMUNITY): Payer: Medicare HMO

## 2021-07-08 DIAGNOSIS — R0602 Shortness of breath: Secondary | ICD-10-CM

## 2021-07-08 DIAGNOSIS — Z515 Encounter for palliative care: Secondary | ICD-10-CM | POA: Diagnosis not present

## 2021-07-08 DIAGNOSIS — I5033 Acute on chronic diastolic (congestive) heart failure: Secondary | ICD-10-CM | POA: Diagnosis not present

## 2021-07-08 DIAGNOSIS — I480 Paroxysmal atrial fibrillation: Secondary | ICD-10-CM | POA: Diagnosis not present

## 2021-07-08 DIAGNOSIS — E440 Moderate protein-calorie malnutrition: Secondary | ICD-10-CM | POA: Diagnosis not present

## 2021-07-08 DIAGNOSIS — I4892 Unspecified atrial flutter: Secondary | ICD-10-CM | POA: Diagnosis not present

## 2021-07-08 DIAGNOSIS — J9621 Acute and chronic respiratory failure with hypoxia: Secondary | ICD-10-CM | POA: Diagnosis not present

## 2021-07-08 DIAGNOSIS — D701 Agranulocytosis secondary to cancer chemotherapy: Secondary | ICD-10-CM | POA: Diagnosis not present

## 2021-07-08 DIAGNOSIS — J439 Emphysema, unspecified: Secondary | ICD-10-CM | POA: Diagnosis not present

## 2021-07-08 DIAGNOSIS — Z7189 Other specified counseling: Secondary | ICD-10-CM | POA: Diagnosis not present

## 2021-07-08 DIAGNOSIS — R531 Weakness: Secondary | ICD-10-CM

## 2021-07-08 DIAGNOSIS — D630 Anemia in neoplastic disease: Secondary | ICD-10-CM | POA: Diagnosis not present

## 2021-07-08 DIAGNOSIS — I110 Hypertensive heart disease with heart failure: Secondary | ICD-10-CM | POA: Diagnosis not present

## 2021-07-08 DIAGNOSIS — C3432 Malignant neoplasm of lower lobe, left bronchus or lung: Secondary | ICD-10-CM | POA: Diagnosis not present

## 2021-07-08 LAB — COMPREHENSIVE METABOLIC PANEL
ALT: 30 U/L (ref 0–44)
AST: 25 U/L (ref 15–41)
Albumin: 3 g/dL — ABNORMAL LOW (ref 3.5–5.0)
Alkaline Phosphatase: 72 U/L (ref 38–126)
Anion gap: 8 (ref 5–15)
BUN: 13 mg/dL (ref 8–23)
CO2: 31 mmol/L (ref 22–32)
Calcium: 8.6 mg/dL — ABNORMAL LOW (ref 8.9–10.3)
Chloride: 90 mmol/L — ABNORMAL LOW (ref 98–111)
Creatinine, Ser: 0.81 mg/dL (ref 0.44–1.00)
GFR, Estimated: >60 mL/min (ref >60)
Glucose, Bld: 96 mg/dL (ref 70–99)
Potassium: 4.3 mmol/L (ref 3.5–5.1)
Sodium: 129 mmol/L — ABNORMAL LOW (ref 135–145)
Total Bilirubin: 0.4 mg/dL (ref 0.3–1.2)
Total Protein: 5.6 g/dL — ABNORMAL LOW (ref 6.5–8.1)

## 2021-07-08 LAB — CBC WITH DIFFERENTIAL/PLATELET
Abs Immature Granulocytes: 0.01 10*3/uL (ref 0.00–0.07)
Basophils Absolute: 0 10*3/uL (ref 0.0–0.1)
Basophils Relative: 0 %
Eosinophils Absolute: 0.1 10*3/uL (ref 0.0–0.5)
Eosinophils Relative: 4 %
HCT: 26.5 % — ABNORMAL LOW (ref 36.0–46.0)
Hemoglobin: 9.3 g/dL — ABNORMAL LOW (ref 12.0–15.0)
Immature Granulocytes: 0 %
Lymphocytes Relative: 7 %
Lymphs Abs: 0.3 10*3/uL — ABNORMAL LOW (ref 0.7–4.0)
MCH: 38.8 pg — ABNORMAL HIGH (ref 26.0–34.0)
MCHC: 35.1 g/dL (ref 30.0–36.0)
MCV: 110.4 fL — ABNORMAL HIGH (ref 80.0–100.0)
Monocytes Absolute: 0.4 10*3/uL (ref 0.1–1.0)
Monocytes Relative: 9 %
Neutro Abs: 3.2 10*3/uL (ref 1.7–7.7)
Neutrophils Relative %: 80 %
Platelets: 114 10*3/uL — ABNORMAL LOW (ref 150–400)
RBC: 2.4 MIL/uL — ABNORMAL LOW (ref 3.87–5.11)
RDW: 13.3 % (ref 11.5–15.5)
WBC: 4 10*3/uL (ref 4.0–10.5)
nRBC: 0 % (ref 0.0–0.2)

## 2021-07-08 LAB — PROCALCITONIN: Procalcitonin: <0.1 ng/mL

## 2021-07-08 LAB — MAGNESIUM: Magnesium: 1.8 mg/dL (ref 1.7–2.4)

## 2021-07-08 LAB — PHOSPHORUS: Phosphorus: 4.8 mg/dL — ABNORMAL HIGH (ref 2.5–4.6)

## 2021-07-08 MED ORDER — LIDOCAINE 5 % EX PTCH
1.0000 | MEDICATED_PATCH | CUTANEOUS | Status: DC
  Administered 2021-07-08 – 2021-07-09 (×2): 1 via TRANSDERMAL
  Filled 2021-07-08 (×2): qty 1

## 2021-07-08 MED ORDER — IPRATROPIUM BROMIDE 0.02 % IN SOLN
0.5000 mg | Freq: Four times a day (QID) | RESPIRATORY_TRACT | Status: DC
  Administered 2021-07-08 – 2021-07-09 (×5): 0.5 mg via RESPIRATORY_TRACT
  Filled 2021-07-08 (×5): qty 2.5

## 2021-07-08 MED ORDER — LEVALBUTEROL HCL 0.63 MG/3ML IN NEBU
0.6300 mg | INHALATION_SOLUTION | Freq: Four times a day (QID) | RESPIRATORY_TRACT | Status: DC
  Administered 2021-07-08 – 2021-07-09 (×5): 0.63 mg via RESPIRATORY_TRACT
  Filled 2021-07-08 (×5): qty 3

## 2021-07-08 MED ORDER — PHENOL 1.4 % MT LIQD
1.0000 | OROMUCOSAL | Status: DC | PRN
Start: 2021-07-08 — End: 2021-07-09
  Administered 2021-07-08: 1 via OROMUCOSAL
  Filled 2021-07-08: qty 177

## 2021-07-08 NOTE — Plan of Care (Signed)
  Problem: Education: Goal: Ability to demonstrate management of disease process will improve Outcome: Progressing   Problem: Activity: Goal: Capacity to carry out activities will improve Outcome: Progressing   Problem: Education: Goal: Knowledge of General Education information will improve Description: Including pain rating scale, medication(s)/side effects and non-pharmacologic comfort measures Outcome: Progressing   Problem: Activity: Goal: Risk for activity intolerance will decrease Outcome: Progressing   

## 2021-07-08 NOTE — Progress Notes (Signed)
SATURATION QUALIFICATIONS: (This note is used to comply with regulatory documentation for home oxygen)  Patient Saturations on 2L of oxygen at Rest = 95-98%  Patient Saturations on 2L of oxygen while Ambulating = 85-88%  Patient Saturations on 3 L of oxygen while Ambulating = 92-94%  Please briefly explain why patient needs home oxygen: Patient's baseline is 2L of oxygen. While the patient ambulated at her baseline O2,  sats fluctuated in the mid 80's. Patient's O2 bumped up to 3L and sats got up to and remained in the low to mid 90's.

## 2021-07-08 NOTE — Consult Note (Signed)
Consultation Note Date: 07/08/2021   Patient Name: Patricia [name]  DOB: [birth year]  MRN: [id number]  Age / Sex: 71 y.o., female  PCP: [name], MD Referring Physician: Kerney Elbe, DO  Reason for Consultation: Establishing goals of care  HPI/Patient Profile: 71 y.o. female   admitted on 07/06/2021    Clinical Assessment and Goals of Care: 71 year old lady lives at home with her husband, 2 daughters live nearby, has a history of chronic respiratory failure and is on 2 L oxygen nasal cannula chronically, also has mildly reduced ejection fraction 45-50% history of chronic congestive heart failure persistent atrial fibrillation.  Was diagnosed with a small cell lung cancer and is status post chemotherapy and radiation and follows with Dr. Earlie Server at the cancer center.  Patient presented to the emergency department with shortness of breath and because she was having pleuritic chest discomfort cough and rib cage discomfort which she describes almost as a spasms.  She tried muscle relaxers in the outpatient setting without any benefit.  Patient admitted to the hospital medicine service for left lower lobe community-acquired pneumonia. Patient started on antibiotics.  Palliative medicine team consulted for goals of care discussions. Patient is awake alert resting in bed. Palliative medicine is specialized medical care for people living with serious illness. It focuses on providing relief from the symptoms and stress of a serious illness. The goal is to improve quality of life for both the patient and the family. Goals of care: Broad aims of medical therapy in relation to the patient's values and preferences. Our aim is to provide medical care aimed at enabling patients to achieve the goals that matter most to them, given the circumstances of their particular medical situation and their constraints.  Brief  life review performed.  Cancer history discussed.  Discussed about scope of current hospitalization.  Goals wishes and values attempted to be explored.  See additional discussions as listed in the recommendations below.  NEXT OF KIN  Husband, 2 daughters.   SUMMARY OF RECOMMENDATIONS   Status discussions undertaken.  Patient states that she has elected for DO NOT RESUSCITATE.  DNR form filled out in May 2022 is also on the chart.  Hence, differences between full code versus DNR/DNI again discussed with the patient.  She has made a very clear preference that at end-of-life, she desires no heroic measures and elects for DNR/DNI. Continue current mode of care Lidocaine patch for symptom management of mid to upper back area at the site of her rib cage under the scapula. Recommend home with home health and home-based palliative services and outpatient follow-up with Dr. Earlie Server as scheduled. Thank you for the consult.  Code Status/Advance Care Planning: DNR   Symptom Management:     Palliative Prophylaxis:  Frequent Pain Assessment    Psycho-social/Spiritual:  Desire for further Chaplaincy support:yes Additional Recommendations: Caregiving  Support/Resources  Prognosis:  Unable to determine  Discharge Planning: Home with home health PT and home based Palliative Services      Primary Diagnoses: Present  on Admission:  Pneumonia   I have reviewed the medical record, interviewed the patient and family, and examined the patient. The following aspects are pertinent.  Past Medical History:  Diagnosis Date   Anemia    Anxiety    GERD (gastroesophageal reflux disease)    Hyperlipidemia    Hypertension    IBS (irritable bowel syndrome)    Insomnia    Low back pain    Lung cancer ([place]) 10/2020   TIA (transient ischemic attack) 10/2017   no deficits   Vitamin B12 deficiency    Vitamin D deficiency    Social History   Socioeconomic History   Marital status: Married     Spouse name: Not on file   Number of children: Not on file   Years of education: Not on file   Highest education level: Not on file  Occupational History   Not on file  Tobacco Use   Smoking status: Former    Packs/day: 1.00    Years: 50.00    Total pack years: 50.00    Types: Cigarettes    Start date: 54    Quit date: 12/14/2019    Years since quitting: 1.5   Smokeless tobacco: Never  Vaping Use   Vaping Use: Never used  Substance and Sexual Activity   Alcohol use: Not Currently    Comment: occ once every 2 months   Drug use: Never   Sexual activity: Not on file  Other Topics Concern   Not on file  Social History Narrative   Live with husband.  Education HS.  Retired.  Children 2 (daughter). 1 cup coffee.     Social Determinants of Health   Financial Resource Strain: Low Risk  (11/09/2020)   Overall Financial Resource Strain (CARDIA)    Difficulty of Paying Living Expenses: Not hard at all  Food Insecurity: No Food Insecurity (11/09/2020)   Hunger Vital Sign    Worried About Running Out of Food in the Last Year: Never true    Ran Out of Food in the Last Year: Never true  Transportation Needs: No Transportation Needs (11/09/2020)   PRAPARE - Hydrologist (Medical): No    Lack of Transportation (Non-Medical): No  Physical Activity: Not on file  Stress: No Stress Concern Present (11/09/2020)   [place]    Feeling of Stress : Only a little  Social Connections: Socially Integrated (11/09/2020)   Social Connection and Isolation Panel [NHANES]    Frequency of Communication with Friends and Family: More than three times a week    Frequency of Social Gatherings with Friends and Family: More than three times a week    Attends Religious Services: More than 4 times per year    Active Member of [employer] or Organizations: Yes    Attends Music therapist: More than 4 times per  year    Marital Status: Married   Family History  Problem Relation Age of Onset   Breast cancer Sister    Breast cancer Maternal Aunt    Breast cancer Paternal Aunt    Scheduled Meds:  albuterol  2.5 mg Nebulization TID   amiodarone  200 mg Oral Daily   apixaban  5 mg Oral BID   carisoprodol  350 mg Oral BID   cholecalciferol  1,000 Units Oral Daily   clonazePAM  1 mg Oral QHS   docusate sodium  100 mg Oral BID  escitalopram  10 mg Oral Daily   feeding supplement  1 Container Oral TID BM   feeding supplement  237 mL Oral Q24H   fluticasone  2 spray Each Nare QPM   furosemide  20 mg Oral Daily   gabapentin  600 mg Oral BID   guaiFENesin  1,200 mg Oral BID   lidocaine  1 patch Transdermal Q24H   linaclotide  145 mcg Oral q1600   memantine  10 mg Oral BID   metoprolol succinate  25 mg Oral Daily   multivitamin with minerals  1 tablet Oral Daily   pantoprazole  40 mg Oral Daily   protein supplement  1 Scoop Oral TID WC   rosuvastatin  5 mg Oral Daily   Continuous Infusions:  azithromycin 500 mg (07/07/21 1703)   cefTRIAXone (ROCEPHIN)  IV 1 g (07/07/21 1609)   PRN Meds:.acetaminophen, hydrOXYzine, metoprolol tartrate, ondansetron, prochlorperazine, senna-docusate, traMADol, zolpidem Medications Prior to Admission:  Prior to Admission medications   Medication Sig Start Date End Date Taking? Authorizing Provider  acetaminophen (TYLENOL) 325 MG tablet Take 650 mg by mouth every 6 (six) hours as needed for headache or moderate pain.   Yes [provider]  albuterol (VENTOLIN HFA) 108 (90 Base) MCG/ACT inhaler Inhale 2 puffs into the lungs every 6 (six) hours as needed for shortness of breath. 03/04/21  Yes [provider]  amiodarone (PACERONE) 200 MG tablet Take 1 tablet (200 mg total) by mouth daily. 04/24/21  Yes Camnitz, Will Hassell Done, MD  apixaban (ELIQUIS) 5 MG TABS tablet TAKE 1 TABLET(5 MG) BY MOUTH TWICE DAILY Patient taking differently: Take 5 mg by mouth  2 (two) times daily. 06/12/21  Yes Lelon Perla, MD  Biotin 1000 MCG CHEW Chew 1,000 mg by mouth daily.   Yes [provider]  Black Cohosh (REMIFEMIN PO) Take 1 tablet by mouth 2 (two) times daily.   Yes [provider]  carboxymethylcellulose (REFRESH PLUS) 0.5 % SOLN Place 1 drop into both eyes 3 (three) times daily as needed (for dryness).    Yes [provider]  carisoprodol (SOMA) 350 MG tablet Take 350 mg by mouth 2 (two) times daily. 09/26/17  Yes [provider]  Cholecalciferol (VITAMIN D3 PO) Take 2 capsules by mouth daily.   Yes [provider]  clobetasol ointment (TEMOVATE) 8.50 % Apply 1 application. topically daily as needed (vaginal irritation).   Yes [provider]  clonazePAM (KLONOPIN) 1 MG tablet Take 1 mg by mouth at bedtime.  10/15/17  Yes [provider]  Coenzyme Q10 200 MG capsule Take 400 mg by mouth daily.   Yes [provider]  Cyanocobalamin (B-12 PO) Take 2 capsules by mouth daily.   Yes [provider]  docusate sodium (COLACE) 100 MG capsule Take 1 capsule (100 mg total) by mouth 2 (two) times daily. Patient taking differently: Take 100 mg by mouth daily. 01/03/21  Yes Pokhrel, Laxman, MD  escitalopram (LEXAPRO) 10 MG tablet Take 1 tablet (10 mg total) by mouth daily. Patient taking differently: Take 20 mg by mouth daily. 01/04/21  Yes Pokhrel, Laxman, MD  fluticasone (FLONASE) 50 MCG/ACT nasal spray Place 2 sprays into both nostrils every evening. 10/11/17  Yes [provider]  furosemide (LASIX) 20 MG tablet Take 1 tablet (20 mg total) by mouth daily. Take only if weight goes above 180 lbs. Patient taking differently: Take 20 mg by mouth daily as needed for fluid (weight gain of 3 - 4  lbs overnight). 02/21/21 07/06/21 Yes Arrien, Jimmy Picket, MD  gabapentin (NEURONTIN) 600 MG tablet Take 600 mg by mouth 2 (two) times daily. in the morning and at lunchtime 09/16/17  Yes  [provider]  guaiFENesin-dextromethorphan (ROBITUSSIN DM) 100-10 MG/5ML syrup Take 10 mLs by mouth every 4 (four) hours as needed for cough. 01/03/21  Yes Pokhrel, Laxman, MD  hydrOXYzine (ATARAX) 25 MG tablet Take 25 mg by mouth at bedtime as needed for sleep or anxiety. 03/05/21  Yes [provider]  ipratropium (ATROVENT) 0.02 % nebulizer solution Take 0.5 mg by nebulization 3 (three) times daily. As directed 01/05/21  Yes [provider]  levalbuterol (XOPENEX) 1.25 MG/3ML nebulizer solution Inhale 1.25 mg into the lungs 3 (three) times daily. 01/10/21  Yes [provider]  linaclotide (LINZESS) 145 MCG CAPS capsule Take 1 capsule (145 mcg total) by mouth daily at 4 PM. Patient taking differently: Take 145 mcg by mouth daily as needed (laxative). 10/03/20  Yes Debbe Odea, MD  memantine (NAMENDA) 10 MG tablet Take 1 tablet (10 mg total) by mouth 2 (two) times daily. 02/13/21  Yes Hayden Pedro, PA-C  metoprolol succinate (TOPROL-XL) 25 MG 24 hr tablet Take 25 mg by mouth daily. 06/23/21  Yes [provider]  omeprazole (PRILOSEC) 20 MG capsule Take 20 mg by mouth daily before breakfast.  08/27/17  Yes [provider]  ondansetron (ZOFRAN-ODT) 8 MG disintegrating tablet Take 8 mg by mouth daily as needed for nausea or vomiting. 10/13/20  Yes [provider]  Polyethyl Glycol-Propyl Glycol (SYSTANE) 0.4-0.3 % GEL ophthalmic gel Place 1 Application into both eyes daily as needed (irritation).   Yes [provider]  polyethylene glycol (MIRALAX / GLYCOLAX) packet Take 17 g by mouth in the morning.   Yes [provider]  prochlorperazine (COMPAZINE) 10 MG tablet Take 1 tablet (10 mg total) by mouth every 6 (six) hours as needed for nausea or vomiting. 10/27/20  Yes Curt Bears, MD  rosuvastatin (CRESTOR) 5 MG tablet Take 5 mg by mouth daily. Take 1 Tablet Daily   Yes [provider]  sodium chloride 1 g  tablet Take 1 g by mouth 2 (two) times daily. 06/23/21  Yes [provider]  traMADol (ULTRAM) 50 MG tablet Take 50 mg by mouth 3 (three) times daily as needed for severe pain. 10/08/19  Yes [provider]  zaleplon (SONATA) 10 MG capsule Take 10 mg by mouth at bedtime as needed for sleep.   Yes [provider]  lidocaine (XYLOCAINE) 2 % solution Use as directed 15 mLs in the mouth or throat every 6 (six) hours as needed for mouth pain. Patient not taking: Reported on 07/06/2021 12/18/20   Sherwood Gambler, MD  magic mouthwash SOLN Take 5 mLs by mouth 4 (four) times daily as needed for mouth pain. Pt is allergic to Magnesium Patient not taking: Reported on 07/06/2021 11/04/20   Sherol Dade E, PA-C  magnesium oxide (MAG-OX) 400 (240 Mg) MG tablet Take 1 tablet (400 mg total) by mouth daily. Patient not taking: Reported on 07/06/2021 11/08/20   Heilingoetter, Cassandra L, PA-C  memantine (NAMENDA) 5 MG tablet Begin this prescription the first day of brain radiation. Week 1: take one tablet po qam. Week 2: take one tablet qam and qpm. Week 3: take two tablets qam, and one tablet po q pm. Week 4: take two tablets qam and qpm. Fill subsequent prescription q month. Patient not taking: Reported on 07/06/2021 02/13/21  Hayden Pedro, PA-C  nystatin (MYCOSTATIN) 100000 UNIT/ML suspension Take 5 mLs (500,000 Units total) by mouth 4 (four) times daily. Patient not taking: Reported on 07/06/2021 12/16/20   Tyler Pita, MD   Allergies  Allergen Reactions   Amoxicillin-Pot Clavulanate Nausea Only and Other (See Comments)    GI upset    Levaquin [Levofloxacin In D5w] Other (See Comments)    Joint problems    Magnesium Hydroxide Other (See Comments)    Welts    Oxycodone Nausea Only   Other Itching    Paper tape   Review of Systems Complains of cough and mid back discomfort  Physical Exam Awake alert oriented resting in bed On 2 L nasal cannula which is her  home oxygen requirements Diminished breath sounds bilaterally Regular work of breathing  Awake alert oriented no focal deficits Abdomen is not distended No edema  Mood and affect within normal limits  Vital Signs: BP (!) 99/59   Pulse 70   Temp 98.2 F (36.8 C) (Oral)   Resp 17   Ht 5\' 7"  (1.702 m)   Wt 75.3 kg   LMP  (LMP Unknown)   SpO2 98%   BMI 26.00 kg/m  Pain Scale: 0-10   Pain Score: 0-No pain   SpO2: SpO2: 98 % O2 Device:SpO2: 98 % O2 Flow Rate: .O2 Flow Rate (L/min): 2 L/min  IO: Intake/output summary:  Intake/Output Summary (Last 24 hours) at 07/08/2021 1107 Last data filed at 07/08/2021 1000 Gross per 24 hour  Intake 1173.88 ml  Output 850 ml  Net 323.88 ml    LBM: Last BM Date : 07/05/21 Baseline Weight: Weight: 74.4 kg Most recent weight: Weight: 75.3 kg     Palliative Assessment/Data:     Palliative performance scale 60%  Time In: 10.10 Time Out: 11.10 Time Total: 60 Greater than 50%  of this time was spent counseling and coordinating care related to the above assessment and plan.  Signed by: Loistine Chance, MD   Please contact Palliative Medicine Team phone at [phone] for questions and concerns.  For individual provider: See Shea Evans

## 2021-07-08 NOTE — Progress Notes (Addendum)
PROGRESS NOTE    Patricia [name]  IHK:[id number] DOB: 04-Sep-1950 DOA: 07/06/2021 PCP: [name], MD   Brief Narrative:   HPI per Dr. Vira Agar Dibia on 07/06/21 Patricia [name] is a 71 y.o. 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  Patient reports having pleuritic chest pain has been ongoing for the past one week.  Has a cough that is nonproductive.  This morning her shortness of breath appeared to be worse, she noted that her oxygen saturations were about 83% on her 2 L of nasal cannula.  Reports having intermittent chest pain. Patient denies fevers, chills, nausea, vomiting. At The ER chest x-ray showed small left pleural effusion questionable infection.  CT of the chest obtained showed no evidence of PE however showed underlying scarring and superimposed pneumonia.  Patient is being admitted for management of pneumonia.   **Interim History She feels a little bit better and is coughing up some sputum but states it is clear and "sticky".  Medications have been adjusted and patient continues to cough up some sputum.  Remains short of breath still on desaturate on 3 L.  Now complaining of some right-sided back pain.  Palliative care involved and adding a lidocaine patch.  Repeat chest x-ray today shows unchanged left pleural effusion and bibasilar airspace disease.   Assessment and Plan: No notes have been filed under this hospital service. Service: Hospitalist  Left [place] L[place] Community Acquired PNA with associated pleural effusion -Patient presents with worsening shortness of breath, CT scan of the chest concerning for pneumonia.   -She has been started on Antibiotics ceftriaxone and  azithromycin and will continue.   -Check procalcitonin, CRP, follow sputum and blood cultures.  -Check Legionella, MRSA. -Repeat CXR in the AM  -Will add Flutter Valve, Incentive Spirometry, and Guaifenesin 1200 mg po BID -C/w Albuterol Neb 2.5 mg Neb TID but will change and add Xopenex/Atrovent q6h -Repeat chest x-ray in the a.m. and will need an Ambulatory home O2 screen prior to discharge -Patient desaturated today on home ambulatory screening and needed at least 3 L of supplemental oxygen -Chest x-ray this a.m. showed "Unchanged left pleural effusion, volume loss and airspace opacity inferiorly in the left lung. The right lung is clear. The heart size and mediastinal contours are stable with aortic atherosclerosis. No  pneumothorax or acute osseous abnormality."   Paroxysmal Atrial Fibrillation, status post cardioversion -Patient is currently rate controlled, continue anticoagulation with Eliquis 5 mg twice daily. -She is on amiodarone and metoprolol XL 25 mg po Daily  -Continue to Monitor patient on telemetry   Chronic Systolic Failure. -Currently appears euvolemic. -Continue home dose of Lasix 20 mg daily for now -Discussed importance of daily weights and sodium/fluid restrictions. - Will ultimately need repeat Echo when sinus rhythm is restored to reassess LV function. If EF still down, may need to consider ischemic evaluation. -Patient is -259.9 mL  -Continue to monitor and monitor strict I's and O's and daily weights continue monitor for signs and symptoms of volume overload   Hypertension -Acceptable blood pressures, continue blood pressure lowering medications. -Continue monitor blood pressures per protocol -Last blood pressure reading was 104/67   Hyperlipidemia -Continue Rosuvastatin 5 mg p.o. 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   Constipation -Start  Colace -She does not want the Linzess    Hyponatremia -Mild with a sodium of 131 and is dropped to 129 -Continue monitor and trend and repeat CMP in a.m. -May need to hold Lasix if continues to drop and may need some gentle IV fluid hydration -Continue to Monitor and Trend and Repeat CMP in the AM   Macrocytic Anemia -Patient's hemoglobin/hematocrit is now relatively stable at 9.3/26.5 with an MCV of 110.4 -Check anemia panel in a.m. -Continue to monitor for signs or symptoms of bleeding; no overt bleeding noted -Repeat CBC in a.m.   Thrombocytopenia -Patient's platelet count is now gone from 106 and is now 114 -Continue monitor for signs and symptoms bleeding; no overt bleeding noted -Repeat CBC in a.m.  Back pain -Complaining of some muscular back pain to the upper back area at the site of her rib cage under the scapula -Palliative care has been consulted and recommending starting lidocaine patch  Goals of Care -Palliative care consulted for further goals of care discussion and she is elected for DO NOT RESUSCITATE and she is a DO NOT INTUBATE -Patient is having some mild back pain in the upper area at the site of her rib and her scapula so lidocaine patch has been initiated. -Palliative care recommending home with home health and home-based palliative services and outpatient follow-up with Dr. Julien Nordmann as scheduled  DVT prophylaxis:  apixaban (ELIQUIS) tablet 5 mg    Code Status: DNR Family Communication: No family currently at bedside  Disposition Plan:  Level of care: Telemetry Status is: Inpatient Remains inpatient appropriate because: Continues to be hypoxic and short of breath.  Anticipating discharging in the next 24 to 48 hours if improved if chest x-ray is improved   Consultants:  None  Procedures:  None  Antimicrobials:  Anti-infectives (From admission, onward)    Start     Dose/Rate Route Frequency Ordered Stop   07/06/21 1445  cefTRIAXone (ROCEPHIN) 1 g  in sodium chloride 0.9 % 100 mL IVPB        1 g 200 mL/hr over 30 Minutes Intravenous Every 24 hours 07/06/21 1431     07/06/21 1445  azithromycin (ZITHROMAX) 500 mg in sodium chloride 0.9 % 250 mL IVPB        500 mg 250 mL/hr over 60 Minutes Intravenous Every 24 hours 07/06/21 1431         Subjective: Seen and examined at bedside and still little short of breath and coughing up some sputum.  Complaining of some back pain.  Denies any lightheadedness or dizziness.  No other concerns or complaints this time.  Objective: Vitals:   07/08/21 0613 07/08/21 0616 07/08/21 1053 07/08/21 1458  BP: (!) 114/58  (!) 99/59 104/67  Pulse: 71  70 72  Resp: 17   20  Temp: 98.2 F (36.8 C)   98.3 F (36.8 C)  TempSrc: Oral   Oral  SpO2: 98%   99%  Weight:  75.3 kg    Height:        Intake/Output Summary (Last 24 hours) at 07/08/2021 1502 Last data filed at 07/08/2021 1300 Gross per 24 hour  Intake 1056.88 ml  Output 500 ml  Net 556.88 ml   Filed Weights   07/06/21 1205 07/07/21 0236 07/08/21 0616  Weight: 74.4 kg 73.5 kg 75.3 kg   Examination: Physical Exam:  Constitutional: WN/WD slightly  overweight chronically ill-appearing Caucasian female currently in no acute distress Respiratory: Slightly diminished to auscultation bilaterally with coarse breath sounds and some slight rhonchi and crackles on the left compared to right, no wheezing, rales. Normal respiratory effort and patient is not tachypenic. No accessory muscle use.  Unlabored breathing but is wearing supplemental oxygen via nasal cannula Cardiovascular: RRR, no murmurs / rubs / gallops. S1 and S2 auscultated.  Abdomen: Soft, non-tender, non-distended. Bowel sounds positive.  GU: Deferred. Musculoskeletal: No clubbing / cyanosis of digits/nails. No joint deformity upper and lower extremities.  Skin: No rashes, lesions, ulcers. No induration; Warm and dry.  Neurologic: CN 2-12 grossly intact with no focal deficits.  Romberg sign  and cerebellar reflexes not assessed.  Psychiatric: Normal judgment and insight. Alert and oriented x 3. Normal mood and appropriate affect.   Data Reviewed: I have personally reviewed following labs and imaging studies  CBC: Recent Labs  Lab 07/06/21 1207 07/07/21 0343 07/08/21 0359  WBC 5.6 4.1 4.0  NEUTROABS  --  3.2 3.2  HGB 11.0* 9.4* 9.3*  HCT 31.7* 27.4* 26.5*  MCV 112.4* 112.8* 110.4*  PLT 107* 106* 353*   Basic Metabolic Panel: Recent Labs  Lab 07/06/21 1207 07/07/21 0348 07/08/21 0359  NA 134* 131* 129*  K 4.9 4.1 4.3  CL 96* 92* 90*  CO2 30 30 31   GLUCOSE 102* 89 96  BUN 12 9 13   CREATININE 0.83 0.91 0.81  CALCIUM 9.1 8.6* 8.6*  MG  --   --  1.8  PHOS  --   --  4.8*   GFR: Estimated Creatinine Clearance: 66.5 mL/min (by C-G formula based on SCr of 0.81 mg/dL). Liver Function Tests: Recent Labs  Lab 07/06/21 1425 07/07/21 0348 07/08/21 0359  AST 26 27 25   ALT 32 31 30  ALKPHOS 78 79 72  BILITOT 0.6 0.4 0.4  PROT 5.8* 6.0* 5.6*  ALBUMIN 3.1* 3.1* 3.0*   Recent Labs  Lab 07/06/21 1425  LIPASE 20   No results for input(s): "AMMONIA" in the last 168 hours. Coagulation Profile: No results for input(s): "INR", "PROTIME" in the last 168 hours. Cardiac Enzymes: No results for input(s): "CKTOTAL", "CKMB", "CKMBINDEX", "TROPONINI" in the last 168 hours. BNP (last 3 results) No results for input(s): "PROBNP" in the last 8760 hours. HbA1C: No results for input(s): "HGBA1C" in the last 72 hours. CBG: No results for input(s): "GLUCAP" in the last 168 hours. Lipid Profile: No results for input(s): "CHOL", "HDL", "LDLCALC", "TRIG", "CHOLHDL", "LDLDIRECT" in the last 72 hours. Thyroid Function Tests: No results for input(s): "TSH", "T4TOTAL", "FREET4", "T3FREE", "THYROIDAB" in the last 72 hours. Anemia Panel: No results for input(s): "VITAMINB12", "FOLATE", "FERRITIN", "TIBC", "IRON", "RETICCTPCT" in the last 72 hours. Sepsis Labs: Recent Labs  Lab  07/06/21 1510 07/06/21 1857 07/07/21 0348 07/08/21 0359  PROCALCITON  --  <0.10 <0.10 <0.10  LATICACIDVEN 0.8 0.8  --   --     Recent Results (from the past 240 hour(s))  Culture, blood (Routine X 2) w Reflex to ID Panel     Status: None (Preliminary result)   Collection Time: 07/06/21  3:10 PM   Specimen: BLOOD  Result Value Ref Range Status   Specimen Description   Final    BLOOD LEFT ANTECUBITAL Performed at [facility], [place] [address], [place], [place] [address]    Special Requests   Final    BOTTLES DRAWN AEROBIC AND ANAEROBIC Blood Culture results may not be optimal due to  an excessive volume of blood received in culture bottles Performed at [place] [address], [place], [place] [address]    Culture   Final    NO GROWTH 2 DAYS Performed at [place] [address] [place], [place] [address]    Report Status PENDING  Incomplete  Culture, blood (Routine X 2) w Reflex to ID Panel     Status: None (Preliminary result)   Collection Time: 07/06/21  3:20 PM   Specimen: BLOOD  Result Value Ref Range Status   Specimen Description   Final    BLOOD RIGHT ANTECUBITAL Performed at [facility] [address] [place], [place] [address]    Special Requests   Final    BOTTLES DRAWN AEROBIC AND ANAEROBIC Blood Culture results may not be optimal due to an excessive volume of blood received in culture bottles Performed at [place] [address], [place], [place] [address]    Culture   Final    NO GROWTH 2 DAYS Performed at [place] [address], [place], [place] [address]    Report Status PENDING  Incomplete  SARS Coronavirus 2 by RT PCR (hospital order, performed in [facility] hospital lab) *cepheid single result test*     Status: None   Collection Time: 07/06/21  3:25 PM   Specimen: Nasal Swab  Result Value Ref Range Status   SARS Coronavirus 2 by RT PCR NEGATIVE  NEGATIVE Final    Comment: (NOTE) SARS-CoV-2 target nucleic acids are NOT DETECTED.  The SARS-CoV-2 RNA is generally detectable in upper and lower respiratory specimens during the acute phase of infection. The lowest concentration of SARS-CoV-2 viral copies this assay can detect is 250 copies / mL. A negative result does not preclude SARS-CoV-2 infection and should not be used as the sole basis for treatment or other patient management decisions.  A negative result may occur with improper specimen collection / handling, submission of specimen other than nasopharyngeal swab, presence of viral mutation(s) within the areas targeted by this assay, and inadequate number of viral copies (<250 copies / mL). A negative result must be combined with clinical observations, patient history, and epidemiological information.  Fact Sheet for Patients:   https://www.patel.info/  Fact Sheet for Healthcare Providers: https://hall.com/  This test is not yet approved or  cleared by the [place] FDA and has been authorized for detection and/or diagnosis of SARS-CoV-2 by FDA under an Emergency Use Authorization (EUA).  This EUA will remain in effect (meaning this test can be used) for the duration of the COVID-19 declaration under Section 564(b)(1) of the Act, 21 U.S.C. section 360bbb-3(b)(1), unless the authorization is terminated or revoked sooner.  Performed at [facility], [place] [address], [place], [place] [address]     Radiology Studies: DG CHEST PORT 1 VIEW  Result Date: 07/08/2021 CLINICAL DATA:  Dyspnea. EXAM: PORTABLE CHEST 1 VIEW COMPARISON:  CT 07/06/2021 and 05/29/2021. Radiographs 07/06/2021 and 07/05/2021. FINDINGS: 0526 hours. Unchanged left pleural effusion, volume loss and airspace opacity inferiorly in the left lung. The right lung is clear. The heart size and mediastinal contours are stable with aortic atherosclerosis.  No pneumothorax or acute osseous abnormality. IMPRESSION: Unchanged left pleural effusion and basilar airspace disease compared with recent prior studies. As correlated with prior CTs, there is evidence of underlying radiation change with potential superimposed pneumonia. Electronically Signed   By: Richardean Sale M.D.   On: 07/08/2021  08:47    Scheduled Meds:  albuterol  2.5 mg Nebulization TID   amiodarone  200 mg Oral Daily   apixaban  5 mg Oral BID   carisoprodol  350 mg Oral BID   cholecalciferol  1,000 Units Oral Daily   clonazePAM  1 mg Oral QHS   docusate sodium  100 mg Oral BID   escitalopram  10 mg Oral Daily   feeding supplement  1 Container Oral TID BM   feeding supplement  237 mL Oral Q24H   fluticasone  2 spray Each Nare QPM   furosemide  20 mg Oral Daily   gabapentin  600 mg Oral BID   guaiFENesin  1,200 mg Oral BID   lidocaine  1 patch Transdermal Q24H   linaclotide  145 mcg Oral q1600   memantine  10 mg Oral BID   metoprolol succinate  25 mg Oral Daily   multivitamin with minerals  1 tablet Oral Daily   pantoprazole  40 mg Oral Daily   protein supplement  1 Scoop Oral TID WC   rosuvastatin  5 mg Oral Daily   Continuous Infusions:  azithromycin 500 mg (07/08/21 1458)   cefTRIAXone (ROCEPHIN)  IV 1 g (07/08/21 1409)    LOS: 2 days   Raiford Noble, DO [facility] Available via Epic secure chat 7am-7pm After these hours, please refer to coverage provider listed on [email or web address] 07/08/2021, 3:02 PM

## 2021-07-09 ENCOUNTER — Inpatient Hospital Stay (HOSPITAL_COMMUNITY): Payer: Medicare HMO

## 2021-07-09 DIAGNOSIS — E440 Moderate protein-calorie malnutrition: Secondary | ICD-10-CM | POA: Diagnosis not present

## 2021-07-09 LAB — COMPREHENSIVE METABOLIC PANEL
ALT: 31 U/L (ref 0–44)
AST: 28 U/L (ref 15–41)
Albumin: 3.1 g/dL — ABNORMAL LOW (ref 3.5–5.0)
Alkaline Phosphatase: 76 U/L (ref 38–126)
Anion gap: 8 (ref 5–15)
BUN: 12 mg/dL (ref 8–23)
CO2: 30 mmol/L (ref 22–32)
Calcium: 8.4 mg/dL — ABNORMAL LOW (ref 8.9–10.3)
Chloride: 92 mmol/L — ABNORMAL LOW (ref 98–111)
Creatinine, Ser: 0.68 mg/dL (ref 0.44–1.00)
GFR, Estimated: >60 mL/min (ref >60)
Glucose, Bld: 90 mg/dL (ref 70–99)
Potassium: 3.8 mmol/L (ref 3.5–5.1)
Sodium: 130 mmol/L — ABNORMAL LOW (ref 135–145)
Total Bilirubin: 0.4 mg/dL (ref 0.3–1.2)
Total Protein: 5.9 g/dL — ABNORMAL LOW (ref 6.5–8.1)

## 2021-07-09 LAB — CBC WITH DIFFERENTIAL/PLATELET
Abs Immature Granulocytes: 0.02 10*3/uL (ref 0.00–0.07)
Basophils Absolute: 0 10*3/uL (ref 0.0–0.1)
Basophils Relative: 1 %
Eosinophils Absolute: 0.1 10*3/uL (ref 0.0–0.5)
Eosinophils Relative: 4 %
HCT: 25.4 % — ABNORMAL LOW (ref 36.0–46.0)
Hemoglobin: 8.9 g/dL — ABNORMAL LOW (ref 12.0–15.0)
Immature Granulocytes: 1 %
Lymphocytes Relative: 7 %
Lymphs Abs: 0.2 10*3/uL — ABNORMAL LOW (ref 0.7–4.0)
MCH: 38.4 pg — ABNORMAL HIGH (ref 26.0–34.0)
MCHC: 35 g/dL (ref 30.0–36.0)
MCV: 109.5 fL — ABNORMAL HIGH (ref 80.0–100.0)
Monocytes Absolute: 0.3 10*3/uL (ref 0.1–1.0)
Monocytes Relative: 8 %
Neutro Abs: 2.8 10*3/uL (ref 1.7–7.7)
Neutrophils Relative %: 79 %
Platelets: 100 10*3/uL — ABNORMAL LOW (ref 150–400)
RBC: 2.32 MIL/uL — ABNORMAL LOW (ref 3.87–5.11)
RDW: 13.4 % (ref 11.5–15.5)
WBC: 3.5 10*3/uL — ABNORMAL LOW (ref 4.0–10.5)
nRBC: 0 % (ref 0.0–0.2)

## 2021-07-09 LAB — GLUCOSE, CAPILLARY: Glucose-Capillary: 84 mg/dL (ref 70–99)

## 2021-07-09 LAB — MAGNESIUM: Magnesium: 1.9 mg/dL (ref 1.7–2.4)

## 2021-07-09 LAB — PHOSPHORUS: Phosphorus: 4.3 mg/dL (ref 2.5–4.6)

## 2021-07-09 MED ORDER — SENNOSIDES-DOCUSATE SODIUM 8.6-50 MG PO TABS
1.0000 | ORAL_TABLET | Freq: Two times a day (BID) | ORAL | Status: DC
  Administered 2021-07-09: 1 via ORAL
  Filled 2021-07-09: qty 1

## 2021-07-09 MED ORDER — RESOURCE INSTANT PROTEIN PO PWD PACKET: 1.0000 | Freq: Three times a day (TID) | ORAL | 0 refills | Status: DC

## 2021-07-09 MED ORDER — ADULT MULTIVITAMIN W/MINERALS CH: 1.0000 | ORAL_TABLET | Freq: Every day | ORAL | 0 refills | Status: DC

## 2021-07-09 MED ORDER — MENTHOL 3 MG MT LOZG
1.0000 | LOZENGE | OROMUCOSAL | Status: DC | PRN
  Filled 2021-07-09: qty 9

## 2021-07-09 MED ORDER — SODIUM CHLORIDE 0.9 % IV BOLUS
500.0000 mL | Freq: Once | INTRAVENOUS | Status: AC
  Administered 2021-07-09: 500 mL via INTRAVENOUS

## 2021-07-09 MED ORDER — LIDOCAINE 5 % EX PTCH: 1.0000 | MEDICATED_PATCH | CUTANEOUS | 0 refills | Status: DC

## 2021-07-09 MED ORDER — ORAL CARE MOUTH RINSE: 15.0000 mL | OROMUCOSAL | Status: DC | PRN

## 2021-07-09 MED ORDER — POLYETHYLENE GLYCOL 3350 17 G PO PACK
17.0000 g | PACK | Freq: Two times a day (BID) | ORAL | Status: DC
  Administered 2021-07-09: 17 g via ORAL
  Filled 2021-07-09 (×2): qty 1

## 2021-07-09 MED ORDER — BISACODYL 10 MG RE SUPP
10.0000 mg | Freq: Once | RECTAL | Status: AC
  Administered 2021-07-09: 10 mg via RECTAL
  Filled 2021-07-09: qty 1

## 2021-07-09 MED ORDER — ENSURE ENLIVE PO LIQD: 237.0000 mL | ORAL | 12 refills | Status: DC

## 2021-07-09 MED ORDER — GUAIFENESIN ER 600 MG PO TB12: 600.0000 mg | ORAL_TABLET | Freq: Two times a day (BID) | ORAL | 0 refills | Status: AC

## 2021-07-09 MED ORDER — CEFDINIR 300 MG PO CAPS: 300.0000 mg | ORAL_CAPSULE | Freq: Two times a day (BID) | ORAL | 0 refills | Status: AC

## 2021-07-09 MED ORDER — AZITHROMYCIN 500 MG PO TABS: 500.0000 mg | ORAL_TABLET | Freq: Every day | ORAL | 0 refills | Status: AC

## 2021-07-09 NOTE — Discharge Summary (Signed)
Physician Discharge Summary   Patient: Patricia [name] MRN: [id number] DOB: 26-Nov-1950  Admit date:     07/06/2021  Discharge date: 07/09/21  Discharge Physician: Raiford Noble, DO   PCP: [name], MD   Recommendations at discharge:   Follow-up with PCP within 1 to 2 weeks and repeat CBC, CMP, mag, Phos within 1 week Repeat chest x-ray in 3 to 6 weeks Follow-up with medical oncology within 1 to 2 weeks for hospital follow-up  Follow-up with radiation oncology in outpatient setting Follow-up with palliative care and the home-based setting  Discharge Diagnoses: Principal Problem:   Pneumonia Active Problems:   Malnutrition of moderate degree  Resolved Problems:   * No resolved hospital problems. [facility] Hospital Course: HPI per Dr. Vira Agar Dibia on 07/06/21 Patricia [name] is a 71 y.o. 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  Patient reports having pleuritic chest pain has been ongoing for the past one week.  Has a cough that is nonproductive.  This morning her shortness of breath appeared to be worse, she noted that her oxygen saturations were about 83% on her 2 L of nasal cannula.  Reports having intermittent chest pain. Patient denies fevers, chills, nausea, vomiting. At The ER chest x-ray showed small left pleural effusion questionable infection.  CT of the chest obtained showed no evidence of PE however showed underlying scarring and superimposed pneumonia.  Patient is being admitted for management of pneumonia.   **Interim History She feels a little bit better and is coughing up some sputum but states it is clear and "sticky".  Medications have been adjusted and patient  continues to cough up some sputum.  She remains short of breath yesterday and desaturated on 3 L and was complaining of some right back pain.  Palliative care involved and adding a lidocaine patch.  Repeat chest x-ray yesterday shows unchanged left pleural effusion and bibasilar airspace disease.  Today chest x-ray is relatively remained unchanged however patient is feeling much better and pain is controlled.  Patient's cough is still there but she states that the flutter valve and incentive is helping as well.  She will be transition to p.o. antibiotics and she had another ambulatory home O2 screen and did not desaturate on her home 2 L so she is medically stable to be discharged and follow-up with her PCP as well as medical oncologist.  She will need palliative care to follow-up at discharge as well if necessary.  Assessment and Plan: No notes have been filed under this hospital service. Service: Hospitalist  Left Lower Lobe Community Acquired PNA with associated pleural effusion, stable -Patient presents with worsening shortness of breath, CT scan of the chest concerning for pneumonia.   -She has been started on Antibiotics ceftriaxone and azithromycin and will continue and changed to p.o. azithromycin and p.o. cefdinir for discharge.   -Check procalcitonin, CRP, follow sputum and blood cultures.  -Check Legionella, MRSA. -Repeat CXR in the AM  -Will add Flutter Valve, Incentive Spirometry, and Guaifenesin 1200 mg po BID -C/w Albuterol Neb 2.5 mg Neb TID but will change and add Xopenex/Atrovent q6h -Repeat ambulatory home O2 screen done and she did not desaturate on her 2 L; Patient desaturated yesterday on home ambulatory screening and needed at  least 3 L of supplemental oxygen -Chest x-ray yesterday a.m. showed "Unchanged left pleural effusion, volume loss and airspace opacity inferiorly in the left lung. The right lung is clear. The heart size and mediastinal contours are stable with aortic  atherosclerosis. No  pneumothorax or acute osseous abnormality." -Today chest x-ray showed "Stable radiographic appearance of the chest. Findings appear largely chronic and related to radiation therapy for lung cancer although the left pleural effusion has mildly enlarged from May." -She will need PCP follow-up within 1 week and outpatient follow-up with her medical oncologist and repeat chest x-ray in 3 to 6 weeks   Paroxysmal Atrial Fibrillation, status post cardioversion -Patient is currently rate controlled, continue anticoagulation with Eliquis 5 mg twice daily. -She is on amiodarone and metoprolol XL 25 mg po Daily  -Continue to Monitor patient on telemetry and she had no issues   Chronic Systolic Failure. -Currently appears euvolemic. -Continue home dose of Lasix 20 mg daily for now -Discussed importance of daily weights and sodium/fluid restrictions. - Will ultimately need repeat Echo when sinus rhythm is restored to reassess LV function. If EF still down, may need to consider ischemic evaluation. -Patient is - 617.2 mL  -Continue to monitor and monitor strict I's and O's and daily weights continue monitor for signs and symptoms of volume overload   Hypertension -Acceptable blood pressures, continue blood pressure lowering medications. -Continue monitor blood pressures per protocol -Last blood pressure reading was 122/70   Hyperlipidemia -Continue Rosuvastatin 5 mg p.o. 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 -Follow-up with radiation oncology and medical oncology in outpatient setting   Constipation -Start Colace and adjust the bowel regimen and she had a small bowel movement this morning -She does not want the Linzess while hospitalized but can be resumed in the outpatient setting   Hyponatremia -Mild with a sodium of 131 and is dropped to 129 yesterday but is now back  up to 130 -Patient takes salt tablets in the outpatient setting this can be resumed -Continue monitor and trend and repeat CMP in a.m. -May need to hold Lasix if continues to drop and may need some gentle IV fluid hydration -Continue to Monitor and Trend and Repeat CMP within the week   Macrocytic Anemia -Patient's hemoglobin/hematocrit is now relatively stable at 9.3/26.5 yesterday and today is 8.9/25.4 with an MCV of 109.5 -Check anemia panel in the outpatient setting -Continue to monitor for signs or symptoms of bleeding; no overt bleeding noted -Repeat CBC in a.m.   Thrombocytopenia -Patient's platelet count is now gone from 106 and is now 114 yesterday and today is 100 -Continue monitor for signs and symptoms bleeding; no overt bleeding noted -Repeat CBC in a.m.   Back pain, improved -Complaining of some muscular back pain to the upper back area at the site of her rib cage under the scapula -Palliative care has been consulted and recommending starting lidocaine patch   Goals of Care -Palliative care consulted for further goals of care discussion and she is elected for DO NOT RESUSCITATE and she is a DO NOT INTUBATE -Patient is having some mild back pain in the upper area at the site of her rib and her scapula so lidocaine patch has been initiated. -Palliative care recommending home with home health and home-based palliative services and outpatient follow-up with Dr. Julien Nordmann as scheduled  Nutrition Documentation    [place]  ED to Hosp-Admission (Current) from 07/06/2021 in [place]  Nutrition Problem Moderate Malnutrition  Etiology chronic illness  [SCLC, chronic resp failure, CHF]  Nutrition Goal Patient will meet greater than or equal to 90% of their needs  Interventions Ensure Enlive (each supplement provides 350kcal and 20 grams of protein), MVI, Boost Breeze, Refer to RD note for recommendations, Education, Liberalize Diet       Consultants: None Procedures performed: None Disposition: Home Diet recommendation:  Discharge Diet Orders (From admission, onward)     Start     Ordered   07/09/21 0000  Diet general        07/09/21 1506           Regular diet DISCHARGE MEDICATION: Allergies as of 07/09/2021       Reactions   Amoxicillin-pot Clavulanate Nausea Only, Other (See Comments)   GI upset   Levaquin [levofloxacin In D5w] Other (See Comments)   Joint problems   Magnesium Hydroxide Other (See Comments)   Welts   Oxycodone Nausea Only   Other Itching   Paper tape        Medication List     STOP taking these medications    Biotin 1000 MCG Chew       TAKE these medications    acetaminophen 325 MG tablet Commonly known as: TYLENOL Take 650 mg by mouth every 6 (six) hours as needed for headache or moderate pain.   albuterol 108 (90 Base) MCG/ACT inhaler Commonly known as: VENTOLIN HFA Inhale 2 puffs into the lungs every 6 (six) hours as needed for shortness of breath.   amiodarone 200 MG tablet Commonly known as: PACERONE Take 1 tablet (200 mg total) by mouth daily.   azithromycin 500 MG tablet Commonly known as: Zithromax Take 1 tablet (500 mg total) by mouth daily for 3 days. Take 1 tablet daily for 3 days.   B-12 PO Take 2 capsules by mouth daily.   carboxymethylcellulose 0.5 % Soln Commonly known as: REFRESH PLUS Place 1 drop into both eyes 3 (three) times daily as needed (for dryness).   carisoprodol 350 MG tablet Commonly known as: SOMA Take 350 mg by mouth 2 (two) times daily.   cefdinir 300 MG capsule Commonly known as: OMNICEF Take 1 capsule (300 mg total) by mouth 2 (two) times daily for 5 days.   clobetasol ointment 0.05 % Commonly known as: TEMOVATE Apply 1 application. topically daily as needed (vaginal irritation).   clonazePAM 1 MG tablet Commonly known as: KLONOPIN Take 1 mg by mouth at bedtime.   Coenzyme Q10 200 MG capsule Take 400 mg by mouth  daily.   docusate sodium 100 MG capsule Commonly known as: COLACE Take 1 capsule (100 mg total) by mouth 2 (two) times daily. What changed: when to take this   Eliquis 5 MG Tabs tablet Generic drug: apixaban TAKE 1 TABLET(5 MG) BY MOUTH TWICE DAILY What changed: See the new instructions.   escitalopram 10 MG tablet Commonly known as: LEXAPRO Take 1 tablet (10 mg total) by mouth daily. What changed: how much to take   feeding supplement Liqd Take 237 mLs by mouth daily.   fluticasone 50 MCG/ACT nasal spray Commonly known as: FLONASE Place 2 sprays into both nostrils every evening.   furosemide 20 MG tablet Commonly known as: LASIX Take 1 tablet (20 mg total) by mouth daily. Take only if weight goes above 180 lbs. What changed:  when to take this  reasons to take this additional instructions   gabapentin 600 MG tablet Commonly known as: NEURONTIN Take 600 mg by mouth 2 (two) times daily. in the morning and at lunchtime   guaiFENesin 600 MG 12 hr tablet Commonly known as: MUCINEX Take 1 tablet (600 mg total) by mouth 2 (two) times daily for 5 days.   guaiFENesin-dextromethorphan 100-10 MG/5ML syrup Commonly known as: ROBITUSSIN DM Take 10 mLs by mouth every 4 (four) hours as needed for cough.   hydrOXYzine 25 MG tablet Commonly known as: ATARAX Take 25 mg by mouth at bedtime as needed for sleep or anxiety.   ipratropium 0.02 % nebulizer solution Commonly known as: ATROVENT Take 0.5 mg by nebulization 3 (three) times daily. As directed   levalbuterol 1.25 MG/3ML nebulizer solution Commonly known as: XOPENEX Inhale 1.25 mg into the lungs 3 (three) times daily.   lidocaine 2 % solution Commonly known as: XYLOCAINE Use as directed 15 mLs in the mouth or throat every 6 (six) hours as needed for mouth pain.   lidocaine 5 % Commonly known as: LIDODERM Place 1 patch onto the skin daily. Remove & Discard patch within 12 hours or as directed by MD Start taking on:  July 10, 2021   linaclotide 145 MCG Caps capsule Commonly known as: LINZESS Take 1 capsule (145 mcg total) by mouth daily at 4 PM. What changed:  when to take this reasons to take this   magic mouthwash Soln Take 5 mLs by mouth 4 (four) times daily as needed for mouth pain. Pt is allergic to Magnesium   magnesium oxide 400 (240 Mg) MG tablet Commonly known as: MAG-OX Take 1 tablet (400 mg total) by mouth daily.   memantine 10 MG tablet Commonly known as: Namenda Take 1 tablet (10 mg total) by mouth 2 (two) times daily. What changed: Another medication with the same name was removed. Continue taking this medication, and follow the directions you see here.   metoprolol succinate 25 MG 24 hr tablet Commonly known as: TOPROL-XL Take 25 mg by mouth daily.   multivitamin with minerals Tabs tablet Take 1 tablet by mouth daily. Start taking on: July 10, 2021   nystatin 100000 UNIT/ML suspension Commonly known as: MYCOSTATIN Take 5 mLs (500,000 Units total) by mouth 4 (four) times daily.   omeprazole 20 MG capsule Commonly known as: PRILOSEC Take 20 mg by mouth daily before breakfast.   ondansetron 8 MG disintegrating tablet Commonly known as: ZOFRAN-ODT Take 8 mg by mouth daily as needed for nausea or vomiting.   polyethylene glycol 17 g packet Commonly known as: MIRALAX / GLYCOLAX Take 17 g by mouth in the morning.   prochlorperazine 10 MG tablet Commonly known as: COMPAZINE Take 1 tablet (10 mg total) by mouth every 6 (six) hours as needed for nausea or vomiting.   protein supplement 6 g Powd Commonly known as: RESOURCE BENEPROTEIN Take 1 Scoop (6 g total) by mouth 3 (three) times daily with meals.   REMIFEMIN PO Take 1 tablet by mouth 2 (two) times daily.   rosuvastatin 5 MG tablet Commonly known as: CRESTOR Take 5 mg by mouth daily. Take 1 Tablet Daily   sodium chloride 1 g tablet Take 1 g by mouth 2 (two) times daily.   Systane 0.4-0.3 % Gel ophthalmic  gel Generic drug: Polyethyl Glycol-Propyl Glycol Place 1 Application into both eyes daily as needed (irritation).   traMADol 50 MG tablet Commonly known as: ULTRAM Take 50 mg by mouth 3 (  three) times daily as needed for severe pain.   VITAMIN D3 PO Take 2 capsules by mouth daily.   zaleplon 10 MG capsule Commonly known as: SONATA Take 10 mg by mouth at bedtime as needed for sleep.        Discharge Exam: Filed Weights   07/07/21 0236 07/08/21 0616 07/09/21 1709  Weight: 73.5 kg 75.3 kg 74.3 kg   Vitals:   07/09/21 1348 07/09/21 1431  BP:  122/70  Pulse:  77  Resp: 17 18  Temp:  98.5 F (36.9 C)  SpO2: 97% 100%   Examination: Physical Exam:  Constitutional: WN/WD chronically ill-appearing slightly overweight Caucasian female currently no acute distress Respiratory: Diminished to auscultation bilaterally with coarse breath sounds, no wheezing, rales, rhonchi or crackles. Normal respiratory effort and patient is not tachypenic. No accessory muscle use.  Unlabored breathing Cardiovascular: RRR, no murmurs / rubs / gallops. S1 and S2 auscultated. No extremity edema. Abdomen: Soft, non-tender, mildly distended second by habitus. Bowel sounds positive.  GU: Deferred. Musculoskeletal: No clubbing / cyanosis of digits/nails. No joint deformity upper and lower extremities.  Neurologic: CN 2-12 grossly intact with no focal deficits.  Romberg sign and cerebellar reflexes not assessed.  Psychiatric: Normal judgment and insight. Alert and oriented x 3. Normal mood and appropriate affect.    Condition at discharge: stable  The results of significant diagnostics from this hospitalization (including imaging, microbiology, ancillary and laboratory) are listed below for reference.   Imaging Studies: DG CHEST PORT 1 VIEW  Result Date: 07/09/2021 CLINICAL DATA:  Pneumonia. EXAM: PORTABLE CHEST 1 VIEW COMPARISON:  Radiographs 07/08/2021, 07/06/2021 and 07/05/2021. CT 07/06/2021.  FINDINGS: 0455 hours. The heart size and mediastinal contours are stable with aortic atherosclerosis. Left pleural effusion, volume loss and airspace opacity inferiorly in the left lung are unchanged. The right lung remains clear. No evidence of pneumothorax. IMPRESSION: Stable radiographic appearance of the chest. Findings appear largely chronic and related to radiation therapy for lung cancer although the left pleural effusion has mildly enlarged from May. Electronically Signed   By: Richardean Sale M.D.   On: 07/09/2021 08:50   DG CHEST PORT 1 VIEW  Result Date: 07/08/2021 CLINICAL DATA:  Dyspnea. EXAM: PORTABLE CHEST 1 VIEW COMPARISON:  CT 07/06/2021 and 05/29/2021. Radiographs 07/06/2021 and 07/05/2021. FINDINGS: 0526 hours. Unchanged left pleural effusion, volume loss and airspace opacity inferiorly in the left lung. The right lung is clear. The heart size and mediastinal contours are stable with aortic atherosclerosis. No pneumothorax or acute osseous abnormality. IMPRESSION: Unchanged left pleural effusion and basilar airspace disease compared with recent prior studies. As correlated with prior CTs, there is evidence of underlying radiation change with potential superimposed pneumonia. Electronically Signed   By: Richardean Sale M.D.   On: 07/08/2021 08:47   CT Angio Chest PE W/Cm &/Or Wo Cm  Result Date: 07/06/2021 CLINICAL DATA:  Shortness of breath, chest pain EXAM: CT ANGIOGRAPHY CHEST WITH CONTRAST TECHNIQUE: Multidetector CT imaging of the chest was performed using the standard protocol during bolus administration of intravenous contrast. Multiplanar CT image reconstructions and MIPs were obtained to evaluate the vascular anatomy. RADIATION DOSE REDUCTION: This exam was performed according to the departmental dose-optimization program which includes automated exposure control, adjustment of the mA and/or kV according to patient size and/or use of iterative reconstruction technique. CONTRAST:   183mL OMNIPAQUE IOHEXOL 350 MG/ML SOLN COMPARISON:  05/29/2021 FINDINGS: Cardiovascular: There is homogeneous enhancement in thoracic aorta. Coronary artery calcifications are seen. Left vertebral artery  is arising from the aortic arch. There are no intraluminal filling defects in the central pulmonary artery branches. Evaluation of small peripheral pulmonary artery branches is limited by infiltrates in the left mid and left lower lung fields. Mediastinum/Nodes: No significant lymphadenopathy seen. Lungs/Pleura: There is moderate left pleural effusion with interval increase. There are patchy infiltrates in the anterior left mid lung fields and in the left lower lobe with interval worsening. Increased interstitial markings with subpleural honeycombing is seen in the periphery of right lower lung fields. There is minimal right pleural effusion. Centrilobular emphysema is seen. Upper Abdomen: There is increased density in the dependent portion of gallbladder lumen. Musculoskeletal: There is interval mild decrease in height of upper endplate of body of T7 vertebra. Alignment of posterior margins of vertebral bodies is unremarkable. Review of the MIP images confirms the above findings. IMPRESSION: There is no evidence of pulmonary artery embolism. There is no evidence of thoracic aortic dissection. Scattered coronary artery calcifications are seen. Moderate left pleural effusion with interval increase in size. There are patchy infiltrates in the lingula and left lower lobe with interval increase. Findings suggest possible underlying scarring and superimposed pneumonia. There is increase in interstitial markings in the periphery of right lower lung fields with subpleural honeycombing. This may suggest interstitial lung disease with possible superimposed interstitial pneumonia. Centrilobular emphysema is seen. There is mild interval compression in the upper endplate of body of T7 vertebra suggesting recent mild compression  fracture. Alignment of posterior margins of vertebral bodies is unremarkable. Increased density in the dependent portion of gallbladder lumen may suggest presence of sludge and stones. Electronically Signed   By: Elmer Picker M.D.   On: 07/06/2021 14:20   DG Chest 2 View  Result Date: 07/06/2021 CLINICAL DATA:  Productive cough and dyspnea on exertion EXAM: CHEST - 2 VIEW COMPARISON:  Radiograph 02/11/2021, chest CT 05/29/2021 FINDINGS: Unchanged cardiomediastinal silhouette. There is a persistent small left pleural effusion with adjacent left basilar consolidation. No pneumothorax. Right lung is clear. No acute osseous abnormality. IMPRESSION: Persistent small left pleural effusion and adjacent left basilar consolidation, which could be atelectasis or infection. Effusion is similar in size to recent chest CT. Electronically Signed   By: Maurine Simmering M.D.   On: 07/06/2021 12:30   DG Chest 2 View  Result Date: 07/06/2021 CLINICAL DATA:  chest pain and SOB EXAM: CHEST - 2 VIEW COMPARISON:  Radiograph 07/05/2021 FINDINGS: Unchanged cardiomediastinal silhouette. There is a persistent small left pleural effusion and adjacent left basilar consolidation. Right lung is clear. No pneumothorax. No acute osseous abnormality. IMPRESSION: Persistent small left pleural effusion and adjacent left basilar consolidation, which could be atelectasis or infection. Electronically Signed   By: Maurine Simmering M.D.   On: 07/06/2021 12:29   DG Cervical Spine 2 or 3 views  Result Date: 07/04/2021 CLINICAL DATA:  Spondylosis without myelopathy or radiculopathy, cervicothoracic region EXAM: CERVICAL SPINE - 2-3 VIEW; THORACIC SPINE - 3 VIEWS COMPARISON:  MRI cervical spine September 2012; CT chest May 2023 FINDINGS: Cervical spine: There is 3 mm anterolisthesis of C4 on C5. There is mild disc space disease at this level. The lower cervical spine is not well visualized due to overlapping soft tissue structures. Multilevel facet  arthropathy involving C2 through C5. Prevertebral soft tissues are unremarkable. Thoracic spine: Normal alignment. Thoracic vertebral body heights are maintained. Scattered mild multilevel degenerative disc disease. Calcifications of the aortic arch. Query partially evaluated retrocardiac pulmonary opacity. IMPRESSION: Cervical, thoracic spine: 1. Diffuse degenerative  changes of the cervical spine including 3 mm anterolisthesis of C4 on C5 and multilevel facet arthropathy. 2. Thoracic spine demonstrates normal alignment with scattered mild degenerative changes. 3. Query partially evaluated retrocardiac pulmonary opacity. This was described on recent CT chest dated May 2023, and may be related to postradiation changes versus evolving pneumonia. Given persistence, serial evaluation with two-view chest radiograph is recommended. These results will be called to the ordering clinician or representative by the Radiologist Assistant, and communication documented in the PACS or [employer]. Electronically Signed   By: Albin Felling M.D.   On: 07/04/2021 15:56   DG Thoracic Spine W/Swimmers  Result Date: 07/04/2021 CLINICAL DATA:  Spondylosis without myelopathy or radiculopathy, cervicothoracic region EXAM: CERVICAL SPINE - 2-3 VIEW; THORACIC SPINE - 3 VIEWS COMPARISON:  MRI cervical spine September 2012; CT chest May 2023 FINDINGS: Cervical spine: There is 3 mm anterolisthesis of C4 on C5. There is mild disc space disease at this level. The lower cervical spine is not well visualized due to overlapping soft tissue structures. Multilevel facet arthropathy involving C2 through C5. Prevertebral soft tissues are unremarkable. Thoracic spine: Normal alignment. Thoracic vertebral body heights are maintained. Scattered mild multilevel degenerative disc disease. Calcifications of the aortic arch. Query partially evaluated retrocardiac pulmonary opacity. IMPRESSION: Cervical, thoracic spine: 1. Diffuse degenerative changes  of the cervical spine including 3 mm anterolisthesis of C4 on C5 and multilevel facet arthropathy. 2. Thoracic spine demonstrates normal alignment with scattered mild degenerative changes. 3. Query partially evaluated retrocardiac pulmonary opacity. This was described on recent CT chest dated May 2023, and may be related to postradiation changes versus evolving pneumonia. Given persistence, serial evaluation with two-view chest radiograph is recommended. These results will be called to the ordering clinician or representative by the Radiologist Assistant, and communication documented in the PACS or [employer]. Electronically Signed   By: Albin Felling M.D.   On: 07/04/2021 15:56   MR Brain W Wo Contrast  Result Date: 06/23/2021 CLINICAL DATA:  Brain metastases, assess treatment response 3T SRS Protocol; prophylactic whole brain radiation EXAM: MRI HEAD WITHOUT AND WITH CONTRAST TECHNIQUE: Multiplanar, multiecho pulse sequences of the brain and surrounding structures were obtained without and with intravenous contrast. CONTRAST:  41mL GADAVIST GADOBUTROL 1 MMOL/ML IV SOLN COMPARISON:  02/14/2021 FINDINGS: Brain: There is no acute infarction or intracranial hemorrhage. There is no intracranial mass, mass effect, or edema. There is no hydrocephalus or extra-axial fluid collection. Ventricles and sulci are stable in size and configuration. Patchy T2 hyperintensity in the supratentorial white matter is similar to the prior study and may reflect chronic microvascular ischemic changes. No abnormal enhancement. Vascular: Major vessel flow voids at the skull base are preserved. Skull and upper cervical spine: Normal marrow signal is preserved. Sinuses/Orbits: Paranasal sinuses are aerated. Orbits are unremarkable. Other: Sella is unremarkable.  Persistent left mastoid effusion. IMPRESSION: No evidence of intracranial metastatic disease. Electronically Signed   By: Macy Mis M.D.   On: 06/23/2021 10:19     Microbiology: Results for orders placed or performed during the hospital encounter of 07/06/21  Culture, blood (Routine X 2) w Reflex to ID Panel     Status: None (Preliminary result)   Collection Time: 07/06/21  3:10 PM   Specimen: BLOOD  Result Value Ref Range Status   Specimen Description   Final    BLOOD LEFT ANTECUBITAL Performed at [facility] [address] [place], [place] [address]    Special Requests   Final  BOTTLES DRAWN AEROBIC AND ANAEROBIC Blood Culture results may not be optimal due to an excessive volume of blood received in culture bottles Performed at [facility], [place] [address], [place], [place] [address]    Culture   Final    NO GROWTH 3 DAYS Performed at [facility], [place] [address], [place], [place] [address]    Report Status PENDING  Incomplete  Culture, blood (Routine X 2) w Reflex to ID Panel     Status: None (Preliminary result)   Collection Time: 07/06/21  3:20 PM   Specimen: BLOOD  Result Value Ref Range Status   Specimen Description   Final    BLOOD RIGHT ANTECUBITAL Performed at [facility] [address] [place], [place] [address]    Special Requests   Final    BOTTLES DRAWN AEROBIC AND ANAEROBIC Blood Culture results may not be optimal due to an excessive volume of blood received in culture bottles Performed at [facility] [address], [place], [place] [address]    Culture   Final    NO GROWTH 3 DAYS Performed at [facility], [place] [address] [place], [place] [address]    Report Status PENDING  Incomplete  SARS Coronavirus 2 by RT PCR (hospital order, performed in [facility] hospital lab) *cepheid single result test*     Status: None   Collection Time: 07/06/21  3:25 PM   Specimen: Nasal Swab  Result Value Ref Range Status   SARS Coronavirus 2 by RT PCR NEGATIVE NEGATIVE Final    Comment: (NOTE) SARS-CoV-2 target nucleic acids are NOT  DETECTED.  The SARS-CoV-2 RNA is generally detectable in upper and lower respiratory specimens during the acute phase of infection. The lowest concentration of SARS-CoV-2 viral copies this assay can detect is 250 copies / mL. A negative result does not preclude SARS-CoV-2 infection and should not be used as the sole basis for treatment or other patient management decisions.  A negative result may occur with improper specimen collection / handling, submission of specimen other than nasopharyngeal swab, presence of viral mutation(s) within the areas targeted by this assay, and inadequate number of viral copies (<250 copies / mL). A negative result must be combined with clinical observations, patient history, and epidemiological information.  Fact Sheet for Patients:   https://www.patel.info/  Fact Sheet for Healthcare Providers: https://hall.com/  This test is not yet approved or  cleared by the [place] FDA and has been authorized for detection and/or diagnosis of SARS-CoV-2 by FDA under an Emergency Use Authorization (EUA).  This EUA will remain in effect (meaning this test can be used) for the duration of the COVID-19 declaration under Section 564(b)(1) of the Act, 21 U.S.C. section 360bbb-3(b)(1), unless the authorization is terminated or revoked sooner.  Performed at [facility], [place] [address], [place], [place] [address]     Labs: CBC: Recent Labs  Lab 07/06/21 1207 07/07/21 0343 07/08/21 0359 07/09/21 0359  WBC 5.6 4.1 4.0 3.5*  NEUTROABS  --  3.2 3.2 2.8  HGB 11.0* 9.4* 9.3* 8.9*  HCT 31.7* 27.4* 26.5* 25.4*  MCV 112.4* 112.8* 110.4* 109.5*  PLT 107* 106* 114* 774*   Basic Metabolic Panel: Recent Labs  Lab 07/06/21 1207 07/07/21 0348 07/08/21 0359 07/09/21 0359  NA 134* 131* 129* 130*  K 4.9 4.1 4.3 3.8  CL 96* 92* 90* 92*  CO2 30 30 31 30   GLUCOSE 102* 89 96 90  BUN 12 9 13 12    CREATININE 0.83 0.91 0.81 0.68  CALCIUM 9.1 8.6* 8.6* 8.4*  MG  --   --  1.8 1.9  PHOS  --   --  4.8* 4.3   Liver Function Tests: Recent Labs  Lab 07/06/21 1425 07/07/21 0348 07/08/21 0359 07/09/21 0359  AST 26 27 25 28   ALT 32 31 30 31   ALKPHOS 78 79 72 76  BILITOT 0.6 0.4 0.4 0.4  PROT 5.8* 6.0* 5.6* 5.9*  ALBUMIN 3.1* 3.1* 3.0* 3.1*   CBG: Recent Labs  Lab 07/09/21 0757  GLUCAP 84   Discharge time spent: greater than 30 minutes.  Signed: Raiford Noble, DO [facility] 07/09/2021

## 2021-07-09 NOTE — Progress Notes (Signed)
AVS and discharge instructions reviewed w/ patient. Patient verbalized understanding and had no further questions. 

## 2021-07-09 NOTE — Plan of Care (Signed)
  Problem: Education: Goal: Ability to demonstrate management of disease process will improve Outcome: Progressing Goal: Ability to verbalize understanding of medication therapies will improve Outcome: Progressing   Problem: Activity: Goal: Capacity to carry out activities will improve Outcome: Progressing   Problem: Education: Goal: Knowledge of General Education information will improve Description: Including pain rating scale, medication(s)/side effects and non-pharmacologic comfort measures Outcome: Progressing   Problem: Clinical Measurements: Goal: Ability to maintain clinical measurements within normal limits will improve Outcome: Progressing

## 2021-07-09 NOTE — Progress Notes (Signed)
SATURATION QUALIFICATIONS: (This note is used to comply with regulatory documentation for home oxygen)  Patient Saturations on 2L of oxygen at Rest = 95-97%  Patient Saturations on 2L of oxygen while Ambulating = 89-91%  Please briefly explain why patient needs home oxygen: Patient tolerated ambulation on 2L of oxygen and sats did not drop below 88%. Patient's HR stayed in the 90s to low 100s and breathing was unlabored and regular. Patient denied SOB, but did feel winded after ambulating approximately 180 feet. Patient stood in place to rest and sats were 92%.

## 2021-07-10 LAB — LEGIONELLA PNEUMOPHILA SEROGP 1 UR AG: L. pneumophila Serogp 1 Ur Ag: NEGATIVE

## 2021-07-11 LAB — CULTURE, BLOOD (ROUTINE X 2)
Culture: NO GROWTH
Culture: NO GROWTH

## 2021-07-12 DIAGNOSIS — J9621 Acute and chronic respiratory failure with hypoxia: Secondary | ICD-10-CM | POA: Diagnosis not present

## 2021-07-12 DIAGNOSIS — I480 Paroxysmal atrial fibrillation: Secondary | ICD-10-CM | POA: Diagnosis not present

## 2021-07-12 DIAGNOSIS — D701 Agranulocytosis secondary to cancer chemotherapy: Secondary | ICD-10-CM | POA: Diagnosis not present

## 2021-07-12 DIAGNOSIS — I110 Hypertensive heart disease with heart failure: Secondary | ICD-10-CM | POA: Diagnosis not present

## 2021-07-12 DIAGNOSIS — J439 Emphysema, unspecified: Secondary | ICD-10-CM | POA: Diagnosis not present

## 2021-07-12 DIAGNOSIS — C3432 Malignant neoplasm of lower lobe, left bronchus or lung: Secondary | ICD-10-CM | POA: Diagnosis not present

## 2021-07-12 DIAGNOSIS — D630 Anemia in neoplastic disease: Secondary | ICD-10-CM | POA: Diagnosis not present

## 2021-07-12 DIAGNOSIS — I5033 Acute on chronic diastolic (congestive) heart failure: Secondary | ICD-10-CM | POA: Diagnosis not present

## 2021-07-12 DIAGNOSIS — I4892 Unspecified atrial flutter: Secondary | ICD-10-CM | POA: Diagnosis not present

## 2021-07-15 ENCOUNTER — Other Ambulatory Visit: Payer: Self-pay | Admitting: Radiation Oncology

## 2021-07-17 ENCOUNTER — Encounter: Payer: Self-pay | Admitting: Internal Medicine

## 2021-07-19 DIAGNOSIS — I10 Essential (primary) hypertension: Secondary | ICD-10-CM | POA: Diagnosis not present

## 2021-07-19 DIAGNOSIS — E611 Iron deficiency: Secondary | ICD-10-CM | POA: Diagnosis not present

## 2021-07-19 DIAGNOSIS — J9611 Chronic respiratory failure with hypoxia: Secondary | ICD-10-CM | POA: Diagnosis not present

## 2021-07-19 DIAGNOSIS — J189 Pneumonia, unspecified organism: Secondary | ICD-10-CM | POA: Diagnosis not present

## 2021-07-19 DIAGNOSIS — M549 Dorsalgia, unspecified: Secondary | ICD-10-CM | POA: Diagnosis not present

## 2021-07-19 DIAGNOSIS — D638 Anemia in other chronic diseases classified elsewhere: Secondary | ICD-10-CM | POA: Diagnosis not present

## 2021-07-19 DIAGNOSIS — C3492 Malignant neoplasm of unspecified part of left bronchus or lung: Secondary | ICD-10-CM | POA: Diagnosis not present

## 2021-07-19 DIAGNOSIS — E46 Unspecified protein-calorie malnutrition: Secondary | ICD-10-CM | POA: Diagnosis not present

## 2021-07-25 ENCOUNTER — Emergency Department (HOSPITAL_COMMUNITY)
Admission: EM | Admit: 2021-07-25 | Discharge: 2021-07-26 | Disposition: A | Discharge status: Home / Self Care | Payer: Medicare HMO | Attending: Emergency Medicine | Admitting: Emergency Medicine

## 2021-07-25 ENCOUNTER — Emergency Department (HOSPITAL_COMMUNITY): Payer: Medicare HMO

## 2021-07-25 ENCOUNTER — Encounter (HOSPITAL_COMMUNITY): Payer: Self-pay | Admitting: Pharmacy Technician

## 2021-07-25 ENCOUNTER — Other Ambulatory Visit: Payer: Self-pay

## 2021-07-25 DIAGNOSIS — J181 Lobar pneumonia, unspecified organism: Secondary | ICD-10-CM | POA: Diagnosis not present

## 2021-07-25 DIAGNOSIS — G8929 Other chronic pain: Secondary | ICD-10-CM | POA: Insufficient documentation

## 2021-07-25 DIAGNOSIS — J189 Pneumonia, unspecified organism: Secondary | ICD-10-CM | POA: Diagnosis not present

## 2021-07-25 DIAGNOSIS — I509 Heart failure, unspecified: Secondary | ICD-10-CM | POA: Insufficient documentation

## 2021-07-25 DIAGNOSIS — Z7901 Long term (current) use of anticoagulants: Secondary | ICD-10-CM | POA: Diagnosis not present

## 2021-07-25 DIAGNOSIS — Z79899 Other long term (current) drug therapy: Secondary | ICD-10-CM | POA: Diagnosis not present

## 2021-07-25 DIAGNOSIS — M546 Pain in thoracic spine: Secondary | ICD-10-CM | POA: Insufficient documentation

## 2021-07-25 DIAGNOSIS — I110 Hypertensive heart disease with heart failure: Secondary | ICD-10-CM | POA: Diagnosis not present

## 2021-07-25 DIAGNOSIS — R0602 Shortness of breath: Secondary | ICD-10-CM | POA: Insufficient documentation

## 2021-07-25 DIAGNOSIS — Z85118 Personal history of other malignant neoplasm of bronchus and lung: Secondary | ICD-10-CM | POA: Insufficient documentation

## 2021-07-25 DIAGNOSIS — R531 Weakness: Secondary | ICD-10-CM | POA: Diagnosis not present

## 2021-07-25 DIAGNOSIS — R140 Abdominal distension (gaseous): Secondary | ICD-10-CM | POA: Diagnosis not present

## 2021-07-25 DIAGNOSIS — R059 Cough, unspecified: Secondary | ICD-10-CM | POA: Diagnosis not present

## 2021-07-25 DIAGNOSIS — R091 Pleurisy: Secondary | ICD-10-CM | POA: Diagnosis not present

## 2021-07-25 DIAGNOSIS — J90 Pleural effusion, not elsewhere classified: Secondary | ICD-10-CM | POA: Diagnosis not present

## 2021-07-25 LAB — CBC
HCT: 29.2 % — ABNORMAL LOW (ref 36.0–46.0)
Hemoglobin: 10.3 g/dL — ABNORMAL LOW (ref 12.0–15.0)
MCH: 38.6 pg — ABNORMAL HIGH (ref 26.0–34.0)
MCHC: 35.3 g/dL (ref 30.0–36.0)
MCV: 109.4 fL — ABNORMAL HIGH (ref 80.0–100.0)
Platelets: 141 10*3/uL — ABNORMAL LOW (ref 150–400)
RBC: 2.67 MIL/uL — ABNORMAL LOW (ref 3.87–5.11)
RDW: 14.3 % (ref 11.5–15.5)
WBC: 5.1 10*3/uL (ref 4.0–10.5)
nRBC: 0 % (ref 0.0–0.2)

## 2021-07-25 LAB — TROPONIN I (HIGH SENSITIVITY)
Troponin I (High Sensitivity): 4 ng/L (ref <18)
Troponin I (High Sensitivity): 5 ng/L (ref <18)

## 2021-07-25 LAB — COMPREHENSIVE METABOLIC PANEL
ALT: 23 U/L (ref 0–44)
AST: 25 U/L (ref 15–41)
Albumin: 3.7 g/dL (ref 3.5–5.0)
Alkaline Phosphatase: 93 U/L (ref 38–126)
Anion gap: 14 (ref 5–15)
BUN: 11 mg/dL (ref 8–23)
CO2: 26 mmol/L (ref 22–32)
Calcium: 9.5 mg/dL (ref 8.9–10.3)
Chloride: 88 mmol/L — ABNORMAL LOW (ref 98–111)
Creatinine, Ser: 0.8 mg/dL (ref 0.44–1.00)
GFR, Estimated: >60 mL/min (ref >60)
Glucose, Bld: 102 mg/dL — ABNORMAL HIGH (ref 70–99)
Potassium: 4.5 mmol/L (ref 3.5–5.1)
Sodium: 128 mmol/L — ABNORMAL LOW (ref 135–145)
Total Bilirubin: 0.5 mg/dL (ref 0.3–1.2)
Total Protein: 6.6 g/dL (ref 6.5–8.1)

## 2021-07-25 LAB — BRAIN NATRIURETIC PEPTIDE: B Natriuretic Peptide: 137.8 pg/mL — ABNORMAL HIGH (ref 0.0–100.0)

## 2021-07-25 NOTE — ED Provider Triage Note (Signed)
Emergency Medicine Provider Triage Evaluation Note  Patricia [name] , a 71 y.o. female  was evaluated in triage.  Pt complains of shortness of breath and cough. States that she has been on antibiotics for pneumonia and just isnt getting better. Is on 2L [place] at baseline and has not needed additional oxygen. Also states she has chest pain that has been ongoing for the past week. Was just discharged on 7/02.  Review of Systems  Positive:  Negative:   Physical Exam  BP 100/69 (BP Location: Right Arm)   Pulse 75   Temp 97.8 F (36.6 C)   Resp 20   LMP  (LMP Unknown)   SpO2 100%  Gen:   Awake, no distress   Resp:  Normal effort  MSK:   Moves extremities without difficulty  Other:   Medical Decision Making  Medically screening exam initiated at 3:35 PM.  Appropriate orders placed.  Patricia [name] was informed that the remainder of the evaluation will be completed by another provider, this initial triage assessment does not replace that evaluation, and the importance of remaining in the ED until their evaluation is complete.     Bud Face, PA-C 07/25/21 1538

## 2021-07-25 NOTE — ED Triage Notes (Addendum)
Pt here with reports of pleurisy and PNA, dx approx 1 week ago. Pt started on doxycycline at that time. Pt reports ongoing cough and shob. Pt wears 2L [place] at baseline.

## 2021-07-26 ENCOUNTER — Emergency Department (HOSPITAL_COMMUNITY): Payer: Medicare HMO

## 2021-07-26 DIAGNOSIS — R140 Abdominal distension (gaseous): Secondary | ICD-10-CM | POA: Diagnosis not present

## 2021-07-26 DIAGNOSIS — R059 Cough, unspecified: Secondary | ICD-10-CM | POA: Diagnosis not present

## 2021-07-26 DIAGNOSIS — J90 Pleural effusion, not elsewhere classified: Secondary | ICD-10-CM | POA: Diagnosis not present

## 2021-07-26 DIAGNOSIS — R0602 Shortness of breath: Secondary | ICD-10-CM | POA: Diagnosis not present

## 2021-07-26 MED ORDER — MORPHINE SULFATE (PF) 4 MG/ML IV SOLN
4.0000 mg | Freq: Once | INTRAVENOUS | Status: AC
  Administered 2021-07-26: 4 mg via INTRAVENOUS
  Filled 2021-07-26: qty 1

## 2021-07-26 MED ORDER — IOHEXOL 350 MG/ML SOLN
54.0000 mL | Freq: Once | INTRAVENOUS | Status: AC | PRN
  Administered 2021-07-26: 54 mL via INTRAVENOUS

## 2021-07-26 MED ORDER — LACTATED RINGERS IV BOLUS: 500.0000 mL | Freq: Once | INTRAVENOUS | Status: DC

## 2021-07-26 MED ORDER — HYDROMORPHONE HCL 1 MG/ML IJ SOLN
1.0000 mg | Freq: Once | INTRAMUSCULAR | Status: AC
  Administered 2021-07-26: 1 mg via INTRAVENOUS
  Filled 2021-07-26: qty 1

## 2021-07-26 MED ORDER — HYDROCODONE-ACETAMINOPHEN 5-325 MG PO TABS: 2.0000 | ORAL_TABLET | ORAL | 0 refills | Status: DC | PRN

## 2021-07-26 NOTE — Discharge Instructions (Signed)
You are seen in the ER today for your shortness of breath and your back pain.  You do not have life-threatening illness.  You have been prescribed pain medication take at home as needed.  Please follow-up closely with your oncologist later today with any severe symptom.

## 2021-07-26 NOTE — ED Provider Notes (Signed)
[facility] EMERGENCY DEPARTMENT Provider Note   CSN: [id number] Arrival date & time: 07/25/21  1316     History  Chief Complaint  Patient presents with   Pneumonia   Shortness of Breath    Patricia [name] is a 71 y.o. female with stage IV Small cell lung cancer status postchemotherapy and radiation, CHF, chronic A-fib anticoagulated on Eliquis, hypertension, hyperlipidemia who presents with pleuritic chest pain that has been ongoing for 3 weeks with nonproductive cough.  Afebrile states that her shortness of breath is worse than normal though she has not had escalated her oxygen past her baseline 2 L.  Patient states that she was treated with antibiotics in the emergency department and in the hospital with Rocephin and azithromycin per chart review and prescribed doxycycline in the outpatient setting.  Unfortunately she had an allergic reaction to the doxycycline with skin erosions on the mouth and only completed 2 days of antibiosis following her discharge per patient.  At this time her primary concern is the ongoing pleuritic type back pain across her whole back.  She used 1 dose of Tylenol, tramadol, and gabapentin at home today without relief of her pain prompting her presentation to the emergency department. Last bwoel movement  yesterday, no urinary symptoms.   I have personally reviewed patient's medical records.  In addition to the above she has history of SIADH and TIA.  She is anticoagulated on Eliquis, rhythm control with amiodarone.  HPI     Home Medications Prior to Admission medications   Medication Sig Start Date End Date Taking? Authorizing Provider  HYDROcodone-acetaminophen (NORCO/VICODIN) 5-325 MG tablet Take 2 tablets by mouth every 4 (four) hours as needed. 07/26/21  Yes Tuck Dulworth, Gypsy Balsam, PA-C  acetaminophen (TYLENOL) 325 MG tablet Take 650 mg by mouth every 6 (six) hours as needed for headache or moderate pain.    [provider]   albuterol (VENTOLIN HFA) 108 (90 Base) MCG/ACT inhaler Inhale 2 puffs into the lungs every 6 (six) hours as needed for shortness of breath. 03/04/21   [provider]  amiodarone (PACERONE) 200 MG tablet Take 1 tablet (200 mg total) by mouth daily. 04/24/21   Camnitz, Ocie Doyne, MD  apixaban (ELIQUIS) 5 MG TABS tablet TAKE 1 TABLET(5 MG) BY MOUTH TWICE DAILY Patient taking differently: Take 5 mg by mouth 2 (two) times daily. 06/12/21   Lelon Perla, MD  Black Cohosh (REMIFEMIN PO) Take 1 tablet by mouth 2 (two) times daily.    [provider]  carboxymethylcellulose (REFRESH PLUS) 0.5 % SOLN Place 1 drop into both eyes 3 (three) times daily as needed (for dryness).     [provider]  carisoprodol (SOMA) 350 MG tablet Take 350 mg by mouth 2 (two) times daily. 09/26/17   [provider]  Cholecalciferol (VITAMIN D3 PO) Take 2 capsules by mouth daily.    [provider]  clobetasol ointment (TEMOVATE) 5.27 % Apply 1 application. topically daily as needed (vaginal irritation).    [provider]  clonazePAM (KLONOPIN) 1 MG tablet Take 1 mg by mouth at bedtime.  10/15/17   [provider]  Coenzyme Q10 200 MG capsule Take 400 mg by mouth daily.    [provider]  Cyanocobalamin (B-12 PO) Take 2 capsules by mouth daily.    [provider]  docusate sodium (COLACE) 100 MG capsule Take 1 capsule (100 mg total) by mouth 2 (two) times daily. Patient taking differently: Take 100  mg by mouth daily. 01/03/21   Pokhrel, Corrie Mckusick, MD  escitalopram (LEXAPRO) 10 MG tablet Take 1 tablet (10 mg total) by mouth daily. Patient taking differently: Take 20 mg by mouth daily. 01/04/21   Pokhrel, Corrie Mckusick, MD  feeding supplement (ENSURE ENLIVE / ENSURE PLUS) LIQD Take 237 mLs by mouth daily. 07/09/21   Sheikh, Omair Latif, DO  fluticasone (FLONASE) 50 MCG/ACT nasal spray Place 2 sprays into both nostrils every evening. 10/11/17   [provider]  furosemide (LASIX) 20 MG tablet Take 1 tablet (20 mg total) by mouth daily. Take only if weight goes above 180 lbs. Patient taking differently: Take 20 mg by mouth daily as needed for fluid (weight gain of 3 - 4 lbs overnight). 02/21/21 07/06/21  Arrien, Jimmy Picket, MD  gabapentin (NEURONTIN) 600 MG tablet Take 600 mg by mouth 2 (two) times daily. in the morning and at lunchtime 09/16/17   [provider]  guaiFENesin-dextromethorphan (ROBITUSSIN DM) 100-10 MG/5ML syrup Take 10 mLs by mouth every 4 (four) hours as needed for cough. 01/03/21   Pokhrel, Corrie Mckusick, MD  hydrOXYzine (ATARAX) 25 MG tablet Take 25 mg by mouth at bedtime as needed for sleep or anxiety. 03/05/21   [provider]  ipratropium (ATROVENT) 0.02 % nebulizer solution Take 0.5 mg by nebulization 3 (three) times daily. As directed 01/05/21   [provider]  levalbuterol Penne Lash) 1.25 MG/3ML nebulizer solution Inhale 1.25 mg into the lungs 3 (three) times daily. 01/10/21   [provider]  lidocaine (LIDODERM) 5 % Place 1 patch onto the skin daily. Remove & Discard patch within 12 hours or as directed by MD 07/10/21   Raiford Noble Latif, DO  lidocaine (XYLOCAINE) 2 % solution Use as directed 15 mLs in the mouth or throat every 6 (six) hours as needed for mouth pain. Patient not taking: Reported on 07/06/2021 12/18/20   Sherwood Gambler, MD  linaclotide [facility]) 145 MCG CAPS capsule Take 1 capsule (145 mcg total) by mouth daily at 4 PM. Patient taking differently: Take 145 mcg by mouth daily as needed (laxative). 10/03/20   Debbe Odea, MD  magic mouthwash SOLN Take 5 mLs by mouth 4 (four) times daily as needed for mouth pain. Pt is allergic to Magnesium Patient not taking: Reported on 07/06/2021 11/04/20   Sherol Dade E, PA-C  magnesium oxide (MAG-OX) 400 (240 Mg) MG tablet Take 1 tablet (400 mg total) by mouth daily. Patient not taking: Reported on 07/06/2021 11/08/20    Heilingoetter, Cassandra L, PA-C  memantine (NAMENDA) 10 MG tablet Take 1 tablet (10 mg total) by mouth 2 (two) times daily. 02/13/21   Hayden Pedro, PA-C  metoprolol succinate (TOPROL-XL) 25 MG 24 hr tablet Take 25 mg by mouth daily. 06/23/21   [provider]  Multiple Vitamin (MULTIVITAMIN WITH MINERALS) TABS tablet Take 1 tablet by mouth daily. 07/10/21   Raiford Noble Latif, DO  nystatin (MYCOSTATIN) 100000 UNIT/ML suspension Take 5 mLs (500,000 Units total) by mouth 4 (four) times daily. Patient not taking: Reported on 07/06/2021 12/16/20   Tyler Pita, MD  omeprazole (PRILOSEC) 20 MG capsule Take 20 mg by mouth daily before breakfast.  08/27/17   [provider]  ondansetron (ZOFRAN-ODT) 8 MG disintegrating tablet Take 8 mg by mouth daily as needed for nausea or vomiting. 10/13/20   [provider]  Polyethyl Glycol-Propyl Glycol (SYSTANE) 0.4-0.3 % GEL ophthalmic gel Place 1 Application into both eyes daily as needed (irritation).    [provider]  polyethylene glycol (MIRALAX / GLYCOLAX) packet Take 17 g by mouth in the morning.    [provider]  prochlorperazine (COMPAZINE) 10 MG tablet Take 1 tablet (10 mg total) by mouth every 6 (six) hours as needed for nausea or vomiting. 10/27/20   Curt Bears, MD  protein supplement (RESOURCE BENEPROTEIN) 6 g POWD Take 1 Scoop (6 g total) by mouth 3 (three) times daily with meals. 07/09/21   Raiford Noble Latif, DO  rosuvastatin (CRESTOR) 5 MG tablet Take 5 mg by mouth daily. Take 1 Tablet Daily    [provider]  sodium chloride 1 g tablet Take 1 g by mouth 2 (two) times daily. 06/23/21   [provider]  traMADol (ULTRAM) 50 MG tablet Take 50 mg by mouth 3 (three) times daily as needed for severe pain. 10/08/19   [provider]  zaleplon (SONATA) 10 MG capsule Take 10 mg by mouth at bedtime as needed for sleep.    [provider]      Allergies     Amoxicillin-pot clavulanate, Doxycycline, Levaquin [levofloxacin in d5w], Magnesium hydroxide, Oxycodone, and Other    Review of Systems   Review of Systems  Constitutional: Negative.   HENT: Negative.    Respiratory:  Positive for shortness of breath.   Cardiovascular:  Positive for chest pain.  Gastrointestinal:  Positive for constipation. Negative for diarrhea and nausea.  Genitourinary: Negative.   Musculoskeletal: Negative.   Skin: Negative.   Neurological: Negative.     Physical Exam Updated Vital Signs BP 125/66 (BP Location: Right Arm)   Pulse 60   Temp (!) 97.4 F (36.3 C)   Resp 17   LMP  (LMP Unknown)   SpO2 100%  Physical Exam Vitals and nursing note reviewed.  Constitutional:      Appearance: She is not ill-appearing or toxic-appearing.  HENT:     Head: Normocephalic and atraumatic.     Mouth/Throat:     Mouth: Mucous membranes are moist.     Pharynx: No oropharyngeal exudate or posterior oropharyngeal erythema.  Eyes:     General:        Right eye: No discharge.        Left eye: No discharge.     Extraocular Movements: Extraocular movements intact.     Conjunctiva/sclera: Conjunctivae normal.     Pupils: Pupils are equal, round, and reactive to light.  Cardiovascular:     Rate and Rhythm: Normal rate and regular rhythm.     Pulses: Normal pulses.  Pulmonary:     Effort: Pulmonary effort is normal. No tachypnea, accessory muscle usage or respiratory distress.     Breath sounds: Examination of the left-lower field reveals rales. Rales present. No wheezing.     Comments: On 2L supplemental O2 by [place], at baseline.  Abdominal:     General: Bowel sounds are normal. There is distension.     Palpations: There is no shifting dullness, hepatomegaly or mass.     Tenderness: There is no abdominal tenderness. There is no guarding or rebound.  Musculoskeletal:        General: No deformity.     Cervical back: Neck supple.  Skin:    General: Skin is warm and dry.      Capillary Refill: Capillary refill takes less than 2 seconds.  Neurological:     General: No focal deficit present.     Mental Status: She is alert. Mental status is at baseline.  Psychiatric:  Mood and Affect: Mood normal.     ED Results / Procedures / Treatments   Labs (all labs ordered are listed, but only abnormal results are displayed) Labs Reviewed  CBC - Abnormal; Notable for the following components:      Result Value   RBC 2.67 (*)    Hemoglobin 10.3 (*)    HCT 29.2 (*)    MCV 109.4 (*)    MCH 38.6 (*)    Platelets 141 (*)    All other components within normal limits  BRAIN NATRIURETIC PEPTIDE - Abnormal; Notable for the following components:   B Natriuretic Peptide 137.8 (*)    All other components within normal limits  COMPREHENSIVE METABOLIC PANEL - Abnormal; Notable for the following components:   Sodium 128 (*)    Chloride 88 (*)    Glucose, Bld 102 (*)    All other components within normal limits  TROPONIN I (HIGH SENSITIVITY)  TROPONIN I (HIGH SENSITIVITY)    EKG EKG Interpretation  Date/Time:  Tuesday July 25 2021 15:23:53 EDT Ventricular Rate:  77 PR Interval:  208 QRS Duration: 84 QT Interval:  416 QTC Calculation: 470 R Axis:   80 Text Interpretation: Normal sinus rhythm Normal ECG When compared with ECG of 07-Jul-2021 06:29, PREVIOUS ECG IS PRESENT Confirmed by Thayer Jew [phone]) on 07/25/2021 11:36:41 PM  Radiology CT Angio Chest PE W/Cm &/Or Wo Cm  Result Date: 07/26/2021 CLINICAL DATA:  Pleurisy with ongoing cough and shortness of breath. EXAM: CT ANGIOGRAPHY CHEST WITH CONTRAST TECHNIQUE: Multidetector CT imaging of the chest was performed using the standard protocol during bolus administration of intravenous contrast. Multiplanar CT image reconstructions and MIPs were obtained to evaluate the vascular anatomy. RADIATION DOSE REDUCTION: This exam was performed according to the departmental dose-optimization program which  includes automated exposure control, adjustment of the mA and/or kV according to patient size and/or use of iterative reconstruction technique. CONTRAST:  48mL OMNIPAQUE IOHEXOL 350 MG/ML SOLN COMPARISON:  07/06/2021 FINDINGS: Cardiovascular: The heart size is normal. No substantial pericardial effusion. Coronary artery calcification is evident. Mild atherosclerotic calcification is noted in the wall of the thoracic aorta.No thoracic aortic aneurysm. There is no filling defect within the opacified pulmonary arteries to suggest the presence of an acute pulmonary embolus. Mediastinum/Nodes: No mediastinal lymphadenopathy. There is no hilar lymphadenopathy. The esophagus has normal imaging features. There is no axillary lymphadenopathy. Lungs/Pleura: Centrilobular emphsyema noted. Post treatment scarring noted medial left lung with volume loss in left hemithorax. Left parahilar scarring is more confluent than on the 05/29/2021 and 07/06/2021 exams. Left lower lobe collapse/consolidative opacity is similar to prior. Moderate left pleural effusion is similar to the most recent comparison study. Upper Abdomen: Unremarkable. Musculoskeletal: No worrisome lytic or sclerotic osseous abnormality. Interval progression of the T7 compression fracture now with approximately 50% loss of height anteriorly and 25% loss of height posteriorly. Similar appearance of mild compression deformity at the T2 level superior endplate. Review of the MIP images confirms the above findings. IMPRESSION: 1. No CT evidence for acute pulmonary embolus. 2. Post treatment scarring medial left lung with volume loss in left hemithorax. Left parahilar scarring is more confluent than on the 05/29/2021 and 07/06/2021 exams. Imaging features may be related to progressive fibrosis although recurrent/residual disease not excluded. 3. Similar appearance of moderate left pleural effusion. 4. Interval progression of the T7 compression fracture now with  approximately 50% loss of height anteriorly and 25% loss of height posteriorly. Similar appearance of mild compression deformity at  the T2 level. 5. Aortic Atherosclerosis (ICD10-I70.0) and Emphysema (ICD10-J43.9). Electronically Signed   By: Misty Stanley M.D.   On: 07/26/2021 06:30   DG Abdomen 1 View  Result Date: 07/26/2021 CLINICAL DATA:  Abdominal distension. EXAM: ABDOMEN - 1 VIEW COMPARISON:  Radiograph dated 11/17/2019. FINDINGS: There is moderate stool throughout the colon. No bowel dilatation or evidence of obstruction. No free air. Osteopenia with degenerative changes of the spine. L2 vertebroplasty. No acute osseous pathology. IMPRESSION: Moderate stool throughout the colon. No bowel obstruction. Electronically Signed   By: Anner Crete M.D.   On: 07/26/2021 01:16   DG Chest 2 View  Result Date: 07/25/2021 CLINICAL DATA:  Pleurisy, diagnosed with pneumonia 3 weeks ago, weakness and shortness of breath EXAM: CHEST - 2 VIEW COMPARISON:  07/09/2021 FINDINGS: Frontal and lateral views of the chest demonstrate a stable cardiac silhouette. Persistent left basilar consolidation and effusion, with no significant change allowing for differences in positioning and technique. Right chest is clear. No pneumothorax. No acute bony abnormalities. IMPRESSION: 1. Grossly stable left basilar consolidation and effusion. Electronically Signed   By: Randa Ngo M.D.   On: 07/25/2021 16:34    Procedures Procedures    Medications Ordered in ED Medications  lactated ringers bolus 500 mL (500 mLs Intravenous Not Given 07/26/21 0704)  morphine (PF) 4 MG/ML injection 4 mg (4 mg Intravenous Given 07/26/21 0103)  HYDROmorphone (DILAUDID) injection 1 mg (1 mg Intravenous Given 07/26/21 0500)  iohexol (OMNIPAQUE) 350 MG/ML injection 54 mL (54 mLs Intravenous Contrast Given 07/26/21 0555)    ED Course/ Medical Decision Making/ A&P                           Medical Decision Making 71 year old female with  stage IV lung cancer who presents with concern for shortness of breath and back pain consistent with her previously diagnosed pleuritic pain.  Afebrile and intake, vital signs otherwise normal upon arrival.  Cardiac exam is unremarkable, pulmonary exam with rales in the left lung base, abdominal exam with some firmness and distention but otherwise nontender.  Neurovascular status is normal.  We will obtain abdominal film to evaluate abdominal distention and firmness to palpation, fluid bolus for hyponatremia ordered as well as analgesia.   Amount and/or Complexity of Data Reviewed Labs: ordered.    Details: CBC without cytosis but with anemia with hemoglobin of 10 near patient's baseline of 9.  CMP with hyponatremia of 128 decreased from patient's previous value of 130.  BNP only mildly elevated to 137.  Patient's baseline.  Troponin is negative. Radiology: ordered.    Details: Chest x-ray with stable left basilar consolidation with effusion, EKG with normal sinus rhythm  Risk Prescription drug management.   Will obtain CTA to r/o PE at this time, in context of malignancy.  CTA without PE, worsening of T-spine compression fracture, likely etiology of patient's pain.  Suspect shortness of breath is secondary to her ongoing malignancy, no respiratory distress at this time remains on baseline 2 L of oxygen by nasal cannula.  Feeling much better after IV analgesia.  No further work-up warranted in the ER at this time.  Clinical concern for emergent etiology of her symptoms remains low given reassuring work-up.  Will prescribe oral pain medication for the outpatient setting recommend close outpatient follow-up with her oncologist.  Lenell Antu and her husband voiced understanding of her medical evaluation and treatment plan. Each of their questions answered to their expressed  satisfaction.  Return precautions were given.  Patient is well-appearing, stable, and was discharged in good condition.  This chart  was dictated using voice recognition software, Dragon. Despite the best efforts of this provider to proofread and correct errors, errors may still occur which can change documentation meaning.  Final Clinical Impression(s) / ED Diagnoses Final diagnoses:  Shortness of breath  Chronic midline thoracic back pain    Rx / DC Orders ED Discharge Orders          Ordered    HYDROcodone-acetaminophen (NORCO/VICODIN) 5-325 MG tablet  Every 4 hours PRN        07/26/21 0714              Mckala Pantaleon, Gypsy Balsam, PA-C 07/26/21 0716    Orpah Greek, MD 07/27/21 [phone]

## 2021-07-30 NOTE — Progress Notes (Deleted)
Cardiology Office Note Date:  07/30/2021  Patient ID:  Patricia [name], Patricia [name] 08/01/1950, MRN [id number] PCP:  [name], MD  Cardiologist/Electrophysiologist: Dr. Curt Bears    Chief Complaint: *** 57mo  History of Present Illness: Laniesha Das is a 71 y.o. female with history of HTN, HLD, TIA, IBS, SVT, AFib, chronic back pain, lung Ca (non-operable)  She was referred to Dr. Curt Bears June 2020 for an episode of SVT.  She had worn a monitor noting SVT, no AFib (pending final report), planned to discuss case with neurology perhaps consider linq.  The patient wanted to avoid medicines/procedures, was instructed on vagal maneuvers. Final monitor report: Zero atrial fibrillation Sinus rhythm with episodes of paroxysmal SVT   Nov 2021, called with palpitations recommended 2 week monitoring, mentioned issues with pain as well and planned to f/u with her surgeon, thinking perhaps triggered by pain. 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 Date of surgery November 20, 2019)  12/14/19 had an ER visit brought via EMS, reportedly treated by EMS succesfully with adenosine Reported historically vagal maneuvers, cold rag on her face would resolve her palpitations until then. (ECG images noted in ED note) Was wearing long term monitor. She was prescribed lopressor 25mg  BID and discharged from the ER  I saw her Dec 2021 She is feeling well outside of her SVT and R wrist. She denies any symptoms of CP, SOB, DOE, She is limited with her are casted currently otherwise denies any limitations or concerns. No dizzy spells, near syncope or syncope. She had not had any SVT since her last visit, thinks her fall, wrist fracture has triggered some palpitations. The day she called EMS was lasting hours, began to feel weak, lightheaded, numb in her hands, feet and was quite scary. As  soon as they stopped it she felt better. She is tolerating the metoprolol and has not had further palpitations. She mailed the monitor back in yesterday She has been a week without smoking!  Discussed treatment strategies, she preferred to avoid procedures, planned to continue BB, titrate if needed, consider AAD if more   10/01/20 hospitalized with syncope, she was ill with recurrent loose stools for days more then her usual, she was hyponatremic, orthostatic vitals inconsistent. She did have SVT (probably an ATach) though syncope felt 2/2 hypovolemia. 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  She had a biopsy of her cancer at that time.  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  She has been since been started on chemotherapy  She saw Dr. Curt Bears in f/u Oct 27/22, had some brief/mild palpitations, tolerating chemo, no changes were made.  ER visit 11/11/20 with palpitations, tachycardia, EKG was ST 104bpm, was hypokalemic (3.4) (noted by her oncologist as well) and low mag (1.2), started on K+ supplementation, developed Afib w/RVR and given IV lopressor x2 and discharged from the ER back in SR  Saw Dr. Valeta Harms 11/16/20, noted she was planned for XRT with oncology.  Suspected component of COPD and started on maintenance inhaler  Called 11/11 with reports of elevated HRs given an appt 11/29  I saw her 12/06/20 She has had several episodes of racing heart rates associated with marked weakness and near syncope.  She had one this AM, they last 20-30 min, she has to lay or sit down immediately She denies CP, has  not fainted She in general has had a  mild increase in her SOB with the start of her chemo/XRT. When not in RVR/her tachycardia she denies any near syncope or syncope. No obvious bleeding, CBC/counts drifting downward, no one has asked her to stop her Eliquis She has been started on something she says to help with her  neutrophils In d/w Dr. Curt Bears, started on flecainide 50mg  BID  12/18/20 ER visit with painful swallowing found with esophagitis She was in AFib w/RVR 110's-130's,, unaware from a symptom perspective, given a couple dosed of metoprolol with lottle affect, pt declined admisison  I saw her 12/20/20 Chemo and XRT are starting to take their toll on her, she generally feels poorly, tired all of the time. Her throat hurts, says then they gave her the dilaudid in the ER was the first time she has felt OK in several weeks. She has not missed any Eliquis doses for sure She is not certain that she has been in AF the whole time, her BP cuff tells her her HR is in the 60's when she checks it, despite having the sense her HR is beating fast No CP No syncope Sunday she was in so much pain and felt so bad though she thought she might faint. Gets winded with exertion, but not any worse then it has been. No rest SOB She was in a typical flutter, 125, did not want to go to the ER, her flecainide increased  She was admitted 12/26/20 with worsening esophageal pain (2/esophagitis felt 2/2 XRT), progressive weakness and SOB She was in SR at time of admission though after resp txs reverted to Afib 2/RVR Given her significant anemia/pancytopenia her a/c was stopped and therefore her AAD stopped with efforts towards rate control She transferred to [facility] to continue her XRT and cardiology followed her there. Eventually seems HR did improve some and cardiology planned for her to keep close follow up though pt requested virtual visit. Discharged on 01/03/21 Dilt 360mg  daily, metoprolol 75mg  TID Discharge  WBC 0.6 >> 6.5 H/H 7.4/21 >>> 10.8/31 Plts 16 >>> 58  Hospitalized feb 2023, HF exacerbation, in an Atach w/FVR unable to achieve HR control with nodal blocking agents and amiodarone started and DCCV. ERAF with flutter/RVR Echo with LVEF down some to 45-50% DCCV again Transitioned to PO amio  March 21, 2021  outpt f/u back in AFlutter 110s and SBP 80's Had a fall that she reported as a loss of balance. Amio increased and planned for DCCV  DCCV 03/22/21 to SR  Saw Dr. Curt Bears 04/24/21, she was done with XRT/chemo, working with PT at home. Maintaining SR and her amiodarone reduced to daily.  Saw [facility] 06/29/21, orthostatic dizziness, her nephrologist had reduced her Toprol and doing better.  Hospitalized 07/06/21 - 07/09/21 with cough, progressive SOB, hypoxic, CT neg for PE, > did show underlying scarring and pneumonia.  While imaging did not appear to improve, clinically she felt better with treatment of antibiotics, nebs, etc. Discharged to f/u closely with PMD/oncology with notes that report Findings appear largely chronic and related to radiation therapy for lung cancer although the left pleural effusion has mildly enlarged from May Na was 130 at discharge discussed may need to stop lasix if trends down GOC were discussed  and she elected for DNR  07/26/21, ER visit with CP described as pleuritic, responded to pain meds, no acute findings and discharged from the ER  *** rhythm? *** BP *** orthostatic *** volume  AAD hx  Started on Flecainide 12/20/20 > up-titrated 12/20/20 ,stopped with recurrent arrhythmia and plans for rate control Amiodarone started Feb 2023  Past Medical History:  Diagnosis Date   Anemia    Anxiety    GERD (gastroesophageal reflux disease)    Hyperlipidemia    Hypertension    IBS (irritable bowel syndrome)    Insomnia    Low back pain    Lung cancer ([place]) 10/2020   TIA (transient ischemic attack) 10/2017   no deficits   Vitamin B12 deficiency    Vitamin D deficiency     Past Surgical History:  Procedure Laterality Date   BACK SURGERY     BREAST EXCISIONAL BIOPSY Left 1997   benign   BRONCHIAL BIOPSY  10/18/2020   Procedure: BRONCHIAL BIOPSIES;  Surgeon: [name], DO;  Location: [place];  Service: Pulmonary;;   BRONCHIAL BRUSHINGS   10/18/2020   Procedure: BRONCHIAL BRUSHINGS;  Surgeon: [name], DO;  Location: [place];  Service: Pulmonary;;   BRONCHIAL NEEDLE ASPIRATION BIOPSY  10/18/2020   Procedure: BRONCHIAL NEEDLE ASPIRATION BIOPSIES;  Surgeon: [name], DO;  Location: [place];  Service: Pulmonary;;   BRONCHIAL WASHINGS  10/18/2020   Procedure: BRONCHIAL WASHINGS;  Surgeon: [name], DO;  Location: [place];  Service: Pulmonary;;   CARDIOVERSION N/A 02/13/2021   Procedure: CARDIOVERSION;  Surgeon: Werner Lean, MD;  Location: [place];  Service: Cardiovascular;  Laterality: N/A;   CARDIOVERSION N/A 02/20/2021   Procedure: CARDIOVERSION;  Surgeon: Skeet Latch, MD;  Location: [place];  Service: Cardiovascular;  Laterality: N/A;   CARDIOVERSION N/A 03/22/2021   Procedure: CARDIOVERSION;  Surgeon: Freada Bergeron, MD;  Location: [facility] ENDOSCOPY;  Service: Cardiovascular;  Laterality: N/A;   hemorrhoidecotmy     HERNIA MESH REMOVAL     OPEN REDUCTION INTERNAL FIXATION (ORIF) DISTAL RADIAL FRACTURE Right 11/19/2019   Procedure: OPEN REDUCTION INTERNAL FIXATION (ORIF) DISTAL RADIUS AND ULNA FRACTURE WITH REPAIR AS NECESSARY;  Surgeon: Roseanne Kaufman, MD;  Location: [place];  Service: Orthopedics;  Laterality: Right;  2 hrs Block with IV sedation   ORIF RADIUS & ULNA FRACTURES     TONSILLECTOMY     VIDEO BRONCHOSCOPY WITH ENDOBRONCHIAL NAVIGATION Left 10/18/2020   Procedure: VIDEO BRONCHOSCOPY WITH ENDOBRONCHIAL NAVIGATION;  Surgeon: [name], DO;  Location: [place];  Service: Pulmonary;  Laterality: Left;  ION   VIDEO BRONCHOSCOPY WITH ENDOBRONCHIAL ULTRASOUND Bilateral 10/18/2020   Procedure: VIDEO BRONCHOSCOPY WITH ENDOBRONCHIAL ULTRASOUND;  Surgeon: [name], DO;  Location: [place];  Service: Pulmonary;  Laterality: Bilateral;   VIDEO BRONCHOSCOPY WITH RADIAL ENDOBRONCHIAL ULTRASOUND  10/18/2020   Procedure: VIDEO BRONCHOSCOPY WITH  RADIAL ENDOBRONCHIAL ULTRASOUND;  Surgeon: [name], DO;  Location: [facility] ENDOSCOPY;  Service: Pulmonary;;    Current Outpatient Medications  Medication Sig Dispense Refill   acetaminophen (TYLENOL) 325 MG tablet Take 650 mg by mouth every 6 (six) hours as needed for headache or moderate pain.     albuterol (VENTOLIN HFA) 108 (90 Base) MCG/ACT inhaler Inhale 2 puffs into the lungs every 6 (six) hours as needed for shortness of breath.     amiodarone (PACERONE) 200 MG tablet Take 1 tablet (200 mg total) by mouth daily. 90 tablet 3   apixaban (ELIQUIS) 5 MG TABS tablet TAKE 1 TABLET(5 MG) BY MOUTH TWICE DAILY (Patient taking differently: Take 5 mg by mouth 2 (two) times daily.) 60 tablet 5   Black Cohosh (REMIFEMIN PO) Take 1  tablet by mouth 2 (two) times daily.     carboxymethylcellulose (REFRESH PLUS) 0.5 % SOLN Place 1 drop into both eyes 3 (three) times daily as needed (for dryness).      carisoprodol (SOMA) 350 MG tablet Take 350 mg by mouth 2 (two) times daily.  5   Cholecalciferol (VITAMIN D3 PO) Take 2 capsules by mouth daily.     clobetasol ointment (TEMOVATE) 2.50 % Apply 1 application. topically daily as needed (vaginal irritation).     clonazePAM (KLONOPIN) 1 MG tablet Take 1 mg by mouth at bedtime.   3   Coenzyme Q10 200 MG capsule Take 400 mg by mouth daily.     Cyanocobalamin (B-12 PO) Take 2 capsules by mouth daily.     docusate sodium (COLACE) 100 MG capsule Take 1 capsule (100 mg total) by mouth 2 (two) times daily. (Patient taking differently: Take 100 mg by mouth daily.) 10 capsule 0   escitalopram (LEXAPRO) 10 MG tablet Take 1 tablet (10 mg total) by mouth daily. (Patient taking differently: Take 20 mg by mouth daily.) 30 tablet 2   feeding supplement (ENSURE ENLIVE / ENSURE PLUS) LIQD Take 237 mLs by mouth daily. 237 mL 12   fluticasone (FLONASE) 50 MCG/ACT nasal spray Place 2 sprays into both nostrils every evening.  5   furosemide (LASIX) 20 MG tablet Take 1 tablet (20  mg total) by mouth daily. Take only if weight goes above 180 lbs. (Patient taking differently: Take 20 mg by mouth daily as needed for fluid (weight gain of 3 - 4 lbs overnight).) 30 tablet 2   gabapentin (NEURONTIN) 600 MG tablet Take 600 mg by mouth 2 (two) times daily. in the morning and at lunchtime     guaiFENesin-dextromethorphan (ROBITUSSIN DM) 100-10 MG/5ML syrup Take 10 mLs by mouth every 4 (four) hours as needed for cough. 118 mL 0   HYDROcodone-acetaminophen (NORCO/VICODIN) 5-325 MG tablet Take 2 tablets by mouth every 4 (four) hours as needed. 15 tablet 0   hydrOXYzine (ATARAX) 25 MG tablet Take 25 mg by mouth at bedtime as needed for sleep or anxiety.     ipratropium (ATROVENT) 0.02 % nebulizer solution Take 0.5 mg by nebulization 3 (three) times daily. As directed     levalbuterol (XOPENEX) 1.25 MG/3ML nebulizer solution Inhale 1.25 mg into the lungs 3 (three) times daily.     lidocaine (LIDODERM) 5 % Place 1 patch onto the skin daily. Remove & Discard patch within 12 hours or as directed by MD 30 patch 0   lidocaine (XYLOCAINE) 2 % solution Use as directed 15 mLs in the mouth or throat every 6 (six) hours as needed for mouth pain. (Patient not taking: Reported on 07/06/2021) 100 mL 0   linaclotide (LINZESS) 145 MCG CAPS capsule Take 1 capsule (145 mcg total) by mouth daily at 4 PM. (Patient taking differently: Take 145 mcg by mouth daily as needed (laxative).) 30 capsule 0   magic mouthwash SOLN Take 5 mLs by mouth 4 (four) times daily as needed for mouth pain. Pt is allergic to Magnesium (Patient not taking: Reported on 07/06/2021) 240 mL 1   magnesium oxide (MAG-OX) 400 (240 Mg) MG tablet Take 1 tablet (400 mg total) by mouth daily. (Patient not taking: Reported on 07/06/2021) 30 tablet 1   memantine (NAMENDA) 10 MG tablet Take 1 tablet (10 mg total) by mouth 2 (two) times daily. 60 tablet 4   metoprolol succinate (TOPROL-XL) 25 MG 24 hr tablet Take 25  mg by mouth daily.     Multiple  Vitamin (MULTIVITAMIN WITH MINERALS) TABS tablet Take 1 tablet by mouth daily. 30 tablet 0   nystatin (MYCOSTATIN) 100000 UNIT/ML suspension Take 5 mLs (500,000 Units total) by mouth 4 (four) times daily. (Patient not taking: Reported on 07/06/2021) 440 mL 5   omeprazole (PRILOSEC) 20 MG capsule Take 20 mg by mouth daily before breakfast.      ondansetron (ZOFRAN-ODT) 8 MG disintegrating tablet Take 8 mg by mouth daily as needed for nausea or vomiting.     Polyethyl Glycol-Propyl Glycol (SYSTANE) 0.4-0.3 % GEL ophthalmic gel Place 1 Application into both eyes daily as needed (irritation).     polyethylene glycol (MIRALAX / GLYCOLAX) packet Take 17 g by mouth in the morning.     prochlorperazine (COMPAZINE) 10 MG tablet Take 1 tablet (10 mg total) by mouth every 6 (six) hours as needed for nausea or vomiting. 30 tablet 0   protein supplement (RESOURCE BENEPROTEIN) 6 g POWD Take 1 Scoop (6 g total) by mouth 3 (three) times daily with meals. 30 packet 0   rosuvastatin (CRESTOR) 5 MG tablet Take 5 mg by mouth daily. Take 1 Tablet Daily     sodium chloride 1 g tablet Take 1 g by mouth 2 (two) times daily.     traMADol (ULTRAM) 50 MG tablet Take 50 mg by mouth 3 (three) times daily as needed for severe pain.     zaleplon (SONATA) 10 MG capsule Take 10 mg by mouth at bedtime as needed for sleep.     No current facility-administered medications for this visit.    Allergies:   Amoxicillin-pot clavulanate, Doxycycline, Levaquin [levofloxacin in d5w], Magnesium hydroxide, Oxycodone, and Other   Social History:  The patient  reports that she quit smoking about 19 months ago. Her smoking use included cigarettes. She started smoking about 51 years ago. She has a 50.00 pack-year smoking history. She has never used smokeless tobacco. She reports that she does not currently use alcohol. She reports that she does not use drugs.   Family History:  The patient's family history includes Breast cancer in her maternal  aunt, paternal aunt, and sister.  ROS:  Please see the history of present illness.    All other systems are reviewed and otherwise negative.   PHYSICAL EXAM:  VS:  LMP  (LMP Unknown)  BMI: There is no height or weight on file to calculate BMI. Well nourished, well developed, in no acute distress HEENT: normocephalic, atraumatic Neck: no JVD, carotid bruits or masses Cardiac:  *** RRR; tachycardic no significant murmurs, no rubs, or gallops Lungs:  *** CTA b/l, no wheezing, rhonchi or rales Abd: soft, nontender MS: no deformity or atrophy Ext: *** no edema Skin: warm and dry, no rash Neuro:  No gross deficits appreciated Psych: euthymic mood, full affect     EKG:  done today and reviewed by myself Not done today  Echocardiogram 02/13/2021: Impressions:  1. Left ventricular ejection fraction, by estimation, is 45 to 50%. The  left ventricle has mildly decreased function. The left ventricle  demonstrates global hypokinesis.   2. The right ventricular size is mildly enlarged. There is mildly  elevated pulmonary artery systolic pressure. The estimated right  ventricular systolic pressure is 78.2 mmHg.   3. Right atrial size was mildly dilated.   4. Moderate pleural effusion in the left lateral region.   5. Tricuspid valve regurgitation is moderate.   6. The inferior vena cava is  dilated in size with <50% respiratory  variability, suggesting right atrial pressure of 15 mmHg.   Comparison(s): Prior images reviewed side by side. The left ventricular  function is worsened.    10/01/20: TTE  1. Left ventricular ejection fraction, by estimation, is 60 to 65%. The  left ventricle has normal function. The left ventricle has no regional  wall motion abnormalities. Left ventricular diastolic parameters are  consistent with Grade I diastolic dysfunction (impaired relaxation).   2. Right ventricular systolic function is normal. The right ventricular  size is normal.   3. Prominent  epicardial fat.   4. The mitral valve is normal in structure. No evidence of mitral valve  regurgitation. No evidence of mitral stenosis.   5. The aortic valve was not well visualized. There is mild calcification  of the aortic valve. Aortic valve regurgitation is not visualized. Mild  aortic valve sclerosis is present, with no evidence of aortic valve  stenosis.   6. The inferior vena cava is normal in size with <50% respiratory  variability, suggesting right atrial pressure of 8 mmHg.    2 weeks event monitor 12/08/19: Max 234 bpm 02:47pm, 12/01 Min 46 bpm 11:11pm, 12/07 Avg 76 bpm Less than 1% ventricular and supraventricular ectopy The predominant underlying rhythm was sinus rhythm 2% atrial fibrillation burden with heart rates of 107 to 234 bpm Triggered events associated with both atrial fibrillation and SVT.   TTE 10/25/17 - Left ventricle: The cavity size was normal. Systolic function was   normal. The estimated ejection fraction was in the range of 55%   to 60%. Wall motion was normal; there were no regional wall   motion abnormalities. Doppler parameters are consistent with   abnormal left ventricular relaxation (grade 1 diastolic   dysfunction). There was no evidence of elevated ventricular   filling pressure by Doppler parameters. - Aortic valve: There was no regurgitation. - Mitral valve: There was trivial regurgitation. - Right ventricle: The cavity size was normal. Wall thickness was   normal. Systolic function was normal. - Tricuspid valve: There was mild regurgitation. - Pulmonary arteries: Systolic pressure was within the normal   range. - Inferior vena cava: The vessel was normal in size. - Pericardium, extracardiac: There was no pericardial effusion.   Cardiac monitor 06/12/2018  Sinus rhythm with episodes of SVT  Recent Labs: 03/21/2021: TSH 1.910 07/09/2021: Magnesium 1.9 07/25/2021: ALT 23; B Natriuretic Peptide 137.8; BUN 11; Creatinine, Ser 0.80;  Hemoglobin 10.3; Platelets 141; Potassium 4.5; Sodium 128  No results found for requested labs within last 365 days.   CrCl cannot be calculated (Unknown ideal weight.).   Wt Readings from Last 3 Encounters:  07/09/21 163 lb 14.4 oz (74.3 kg)  06/29/21 164 lb 6.4 oz (74.6 kg)  06/01/21 163 lb 4 oz (74 kg)     Other studies reviewed: Additional studies/records reviewed today include: summarized above  ASSESSMENT AND PLAN:  1. SVT Likely an ATach 2. Paroxysmal AFib 3. Aflutter CHA2DS2Vasc is 5, on Eliquis, *** appropriately dosed ***   2. HTN    *** Looks ok      Disposition: ***   Current medicines are reviewed at length with the patient today.  The patient did not have any concerns regarding medicines.  Venetia Night, PA-C 07/30/2021 3:58 PM     [place] [id number] [phone] (office)  [phone] (fax)

## 2021-08-01 ENCOUNTER — Telehealth: Payer: Self-pay | Admitting: Medical Oncology

## 2021-08-01 ENCOUNTER — Ambulatory Visit: Payer: Medicare HMO | Admitting: Physician Assistant

## 2021-08-01 DIAGNOSIS — R7989 Other specified abnormal findings of blood chemistry: Secondary | ICD-10-CM

## 2021-08-01 NOTE — Telephone Encounter (Signed)
High Ferritin-Per Dr Julien Nordmann I informed her that it may be reactive. Will recheck at next appt.

## 2021-08-03 ENCOUNTER — Emergency Department (HOSPITAL_COMMUNITY): Payer: Medicare HMO

## 2021-08-03 ENCOUNTER — Encounter (HOSPITAL_COMMUNITY): Payer: Self-pay | Admitting: Emergency Medicine

## 2021-08-03 ENCOUNTER — Other Ambulatory Visit: Payer: Self-pay

## 2021-08-03 ENCOUNTER — Emergency Department (HOSPITAL_COMMUNITY)
Admission: EM | Admit: 2021-08-03 | Discharge: 2021-08-04 | Disposition: A | Discharge status: Home / Self Care | Payer: Medicare HMO | Attending: Emergency Medicine | Admitting: Emergency Medicine

## 2021-08-03 DIAGNOSIS — I700 Atherosclerosis of aorta: Secondary | ICD-10-CM | POA: Insufficient documentation

## 2021-08-03 DIAGNOSIS — N2889 Other specified disorders of kidney and ureter: Secondary | ICD-10-CM | POA: Diagnosis not present

## 2021-08-03 DIAGNOSIS — K5909 Other constipation: Secondary | ICD-10-CM | POA: Diagnosis not present

## 2021-08-03 DIAGNOSIS — M5126 Other intervertebral disc displacement, lumbar region: Secondary | ICD-10-CM | POA: Diagnosis not present

## 2021-08-03 DIAGNOSIS — M47816 Spondylosis without myelopathy or radiculopathy, lumbar region: Secondary | ICD-10-CM | POA: Insufficient documentation

## 2021-08-03 DIAGNOSIS — S22060A Wedge compression fracture of T7-T8 vertebra, initial encounter for closed fracture: Secondary | ICD-10-CM

## 2021-08-03 DIAGNOSIS — I10 Essential (primary) hypertension: Secondary | ICD-10-CM | POA: Diagnosis not present

## 2021-08-03 DIAGNOSIS — R0602 Shortness of breath: Secondary | ICD-10-CM | POA: Diagnosis not present

## 2021-08-03 DIAGNOSIS — Z85118 Personal history of other malignant neoplasm of bronchus and lung: Secondary | ICD-10-CM | POA: Diagnosis not present

## 2021-08-03 DIAGNOSIS — J90 Pleural effusion, not elsewhere classified: Secondary | ICD-10-CM | POA: Diagnosis not present

## 2021-08-03 DIAGNOSIS — R4781 Slurred speech: Secondary | ICD-10-CM | POA: Diagnosis not present

## 2021-08-03 DIAGNOSIS — I2510 Atherosclerotic heart disease of native coronary artery without angina pectoris: Secondary | ICD-10-CM | POA: Insufficient documentation

## 2021-08-03 DIAGNOSIS — M40204 Unspecified kyphosis, thoracic region: Secondary | ICD-10-CM | POA: Diagnosis not present

## 2021-08-03 DIAGNOSIS — R059 Cough, unspecified: Secondary | ICD-10-CM | POA: Insufficient documentation

## 2021-08-03 LAB — CBC WITH DIFFERENTIAL/PLATELET
Abs Immature Granulocytes: 0.01 10*3/uL (ref 0.00–0.07)
Basophils Absolute: 0 10*3/uL (ref 0.0–0.1)
Basophils Relative: 0 %
Eosinophils Absolute: 0.1 10*3/uL (ref 0.0–0.5)
Eosinophils Relative: 3 %
HCT: 28.5 % — ABNORMAL LOW (ref 36.0–46.0)
Hemoglobin: 9.6 g/dL — ABNORMAL LOW (ref 12.0–15.0)
Immature Granulocytes: 0 %
Lymphocytes Relative: 7 %
Lymphs Abs: 0.3 10*3/uL — ABNORMAL LOW (ref 0.7–4.0)
MCH: 37.8 pg — ABNORMAL HIGH (ref 26.0–34.0)
MCHC: 33.7 g/dL (ref 30.0–36.0)
MCV: 112.2 fL — ABNORMAL HIGH (ref 80.0–100.0)
Monocytes Absolute: 0.3 10*3/uL (ref 0.1–1.0)
Monocytes Relative: 7 %
Neutro Abs: 3.3 10*3/uL (ref 1.7–7.7)
Neutrophils Relative %: 83 %
Platelets: 126 10*3/uL — ABNORMAL LOW (ref 150–400)
RBC: 2.54 MIL/uL — ABNORMAL LOW (ref 3.87–5.11)
RDW: 14.6 % (ref 11.5–15.5)
WBC: 4 10*3/uL (ref 4.0–10.5)
nRBC: 0 % (ref 0.0–0.2)

## 2021-08-03 LAB — URINALYSIS, ROUTINE W REFLEX MICROSCOPIC
Bacteria, UA: NONE SEEN
Bilirubin Urine: NEGATIVE
Glucose, UA: NEGATIVE mg/dL
Hgb urine dipstick: NEGATIVE
Ketones, ur: NEGATIVE mg/dL
Leukocytes,Ua: NEGATIVE
Nitrite: NEGATIVE
Protein, ur: 30 mg/dL — AB
Specific Gravity, Urine: 1.02 (ref 1.005–1.030)
pH: 7 (ref 5.0–8.0)

## 2021-08-03 LAB — COMPREHENSIVE METABOLIC PANEL
ALT: 22 U/L (ref 0–44)
AST: 26 U/L (ref 15–41)
Albumin: 3.6 g/dL (ref 3.5–5.0)
Alkaline Phosphatase: 82 U/L (ref 38–126)
Anion gap: 9 (ref 5–15)
BUN: 18 mg/dL (ref 8–23)
CO2: 30 mmol/L (ref 22–32)
Calcium: 9.1 mg/dL (ref 8.9–10.3)
Chloride: 98 mmol/L (ref 98–111)
Creatinine, Ser: 0.67 mg/dL (ref 0.44–1.00)
GFR, Estimated: >60 mL/min (ref >60)
Glucose, Bld: 99 mg/dL (ref 70–99)
Potassium: 4.2 mmol/L (ref 3.5–5.1)
Sodium: 137 mmol/L (ref 135–145)
Total Bilirubin: 0.3 mg/dL (ref 0.3–1.2)
Total Protein: 7 g/dL (ref 6.5–8.1)

## 2021-08-03 LAB — BRAIN NATRIURETIC PEPTIDE: B Natriuretic Peptide: 183 pg/mL — ABNORMAL HIGH (ref 0.0–100.0)

## 2021-08-03 LAB — TROPONIN I (HIGH SENSITIVITY): Troponin I (High Sensitivity): 4 ng/L (ref <18)

## 2021-08-03 LAB — LIPASE, BLOOD: Lipase: 22 U/L (ref 11–51)

## 2021-08-03 MED ORDER — FENTANYL CITRATE PF 50 MCG/ML IJ SOSY
50.0000 ug | PREFILLED_SYRINGE | Freq: Once | INTRAMUSCULAR | Status: AC
  Administered 2021-08-03: 50 ug via INTRAVENOUS
  Filled 2021-08-03: qty 1

## 2021-08-03 MED ORDER — IOHEXOL 300 MG/ML  SOLN
100.0000 mL | Freq: Once | INTRAMUSCULAR | Status: AC | PRN
  Administered 2021-08-03: 100 mL via INTRAVENOUS

## 2021-08-03 MED ORDER — GADOBUTROL 1 MMOL/ML IV SOLN
7.5000 mL | Freq: Once | INTRAVENOUS | Status: AC | PRN
  Administered 2021-08-03: 7.5 mL via INTRAVENOUS

## 2021-08-03 MED ORDER — IPRATROPIUM-ALBUTEROL 0.5-2.5 (3) MG/3ML IN SOLN
3.0000 mL | Freq: Once | RESPIRATORY_TRACT | Status: AC
  Administered 2021-08-03: 3 mL via RESPIRATORY_TRACT
  Filled 2021-08-03: qty 3

## 2021-08-03 MED ORDER — SODIUM CHLORIDE 0.9 % IV BOLUS
500.0000 mL | Freq: Once | INTRAVENOUS | Status: AC
Start: 2021-08-03 — End: 2021-08-03
  Administered 2021-08-03: 500 mL via INTRAVENOUS

## 2021-08-03 NOTE — ED Provider Notes (Signed)
[place] DEPT Provider Note   CSN: [id number] Arrival date & time: 08/03/21  1447     History  Chief Complaint  Patient presents with   Back Pain   Cough    Patricia [name] is a 71 y.o. female.  HPI 71 year old female with a history of lung cancer that was treated as recently as February presents with generalized weakness, concern for continued pneumonia, and back pain.  Injured her back about 10 years ago and had kyphoplasty.  Has had a few falls over the last few weeks and her back has been bothering her for the last 1 month or so.  Is primarily lower back.  She is also been dealing with a cough and was treated with antibiotics for pneumonia and then started on doxycycline recently for concern for continued pneumonia though she developed an allergic reaction.  Cough has continued ever since last month.  Some green sputum.  No fevers.  She had chest pain yesterday to the point she thought she was having a heart attack though that is gone today.  No leg swelling.  Since chemotherapy starting in January she has been feeling generally weak and this is progressively worsening.  Has overall poor appetite.  She has diffuse generalized weakness.  No leg numbness, bowel or bladder incontinence.  No urinary symptoms.  Some abdominal discomfort on my exam though she states she has not noticed any prior to that.  Has chronic constipation.  She is chronically wearing 2 L.  A lot of the shortness of breath seems to be due to the degree of pain. Son notes that sometimes he can alleviate the pressure in her back when he pulls her up in the strap of the walker.  Home Medications Prior to Admission medications   Medication Sig Start Date End Date Taking? Authorizing Provider  acetaminophen (TYLENOL) 325 MG tablet Take 650 mg by mouth every 6 (six) hours as needed for headache or moderate pain.    [provider]  albuterol (VENTOLIN HFA) 108 (90 Base) MCG/ACT  inhaler Inhale 2 puffs into the lungs every 6 (six) hours as needed for shortness of breath. 03/04/21   [provider]  amiodarone (PACERONE) 200 MG tablet Take 1 tablet (200 mg total) by mouth daily. 04/24/21   Camnitz, Ocie Doyne, MD  apixaban (ELIQUIS) 5 MG TABS tablet TAKE 1 TABLET(5 MG) BY MOUTH TWICE DAILY Patient taking differently: Take 5 mg by mouth 2 (two) times daily. 06/12/21   Lelon Perla, MD  Black Cohosh (REMIFEMIN PO) Take 1 tablet by mouth 2 (two) times daily.    [provider]  carboxymethylcellulose (REFRESH PLUS) 0.5 % SOLN Place 1 drop into both eyes 3 (three) times daily as needed (for dryness).     [provider]  carisoprodol (SOMA) 350 MG tablet Take 350 mg by mouth 2 (two) times daily. 09/26/17   [provider]  Cholecalciferol (VITAMIN D3 PO) Take 2 capsules by mouth daily.    [provider]  clobetasol ointment (TEMOVATE) 4.81 % Apply 1 application. topically daily as needed (vaginal irritation).    [provider]  clonazePAM (KLONOPIN) 1 MG tablet Take 1 mg by mouth at bedtime.  10/15/17   [provider]  Coenzyme Q10 200 MG capsule Take 400 mg by mouth daily.    [provider]  Cyanocobalamin (B-12 PO) Take 2 capsules by mouth daily.    [provider]  docusate sodium (COLACE) 100  MG capsule Take 1 capsule (100 mg total) by mouth 2 (two) times daily. Patient taking differently: Take 100 mg by mouth daily. 01/03/21   Pokhrel, Corrie Mckusick, MD  escitalopram (LEXAPRO) 10 MG tablet Take 1 tablet (10 mg total) by mouth daily. Patient taking differently: Take 20 mg by mouth daily. 01/04/21   Pokhrel, Corrie Mckusick, MD  feeding supplement (ENSURE ENLIVE / ENSURE PLUS) LIQD Take 237 mLs by mouth daily. 07/09/21   Sheikh, Omair Latif, DO  fluticasone (FLONASE) 50 MCG/ACT nasal spray Place 2 sprays into both nostrils every evening. 10/11/17   [provider]  furosemide (LASIX) 20 MG tablet Take  1 tablet (20 mg total) by mouth daily. Take only if weight goes above 180 lbs. Patient taking differently: Take 20 mg by mouth daily as needed for fluid (weight gain of 3 - 4 lbs overnight). 02/21/21 07/06/21  Arrien, Jimmy Picket, MD  gabapentin (NEURONTIN) 600 MG tablet Take 600 mg by mouth 2 (two) times daily. in the morning and at lunchtime 09/16/17   [provider]  guaiFENesin-dextromethorphan (ROBITUSSIN DM) 100-10 MG/5ML syrup Take 10 mLs by mouth every 4 (four) hours as needed for cough. 01/03/21   Pokhrel, Corrie Mckusick, MD  HYDROcodone-acetaminophen (NORCO/VICODIN) 5-325 MG tablet Take 2 tablets by mouth every 4 (four) hours as needed. 07/26/21   Sponseller, Gypsy Balsam, PA-C  hydrOXYzine (ATARAX) 25 MG tablet Take 25 mg by mouth at bedtime as needed for sleep or anxiety. 03/05/21   [provider]  ipratropium (ATROVENT) 0.02 % nebulizer solution Take 0.5 mg by nebulization 3 (three) times daily. As directed 01/05/21   [provider]  levalbuterol Penne Lash) 1.25 MG/3ML nebulizer solution Inhale 1.25 mg into the lungs 3 (three) times daily. 01/10/21   [provider]  lidocaine (LIDODERM) 5 % Place 1 patch onto the skin daily. Remove & Discard patch within 12 hours or as directed by MD 07/10/21   Raiford Noble Latif, DO  lidocaine (XYLOCAINE) 2 % solution Use as directed 15 mLs in the mouth or throat every 6 (six) hours as needed for mouth pain. Patient not taking: Reported on 07/06/2021 12/18/20   Sherwood Gambler, MD  linaclotide [facility]) 145 MCG CAPS capsule Take 1 capsule (145 mcg total) by mouth daily at 4 PM. Patient taking differently: Take 145 mcg by mouth daily as needed (laxative). 10/03/20   Debbe Odea, MD  magic mouthwash SOLN Take 5 mLs by mouth 4 (four) times daily as needed for mouth pain. Pt is allergic to Magnesium Patient not taking: Reported on 07/06/2021 11/04/20   Sherol Dade E, PA-C  magnesium oxide (MAG-OX) 400 (240 Mg) MG tablet Take 1  tablet (400 mg total) by mouth daily. Patient not taking: Reported on 07/06/2021 11/08/20   Heilingoetter, Cassandra L, PA-C  memantine (NAMENDA) 10 MG tablet Take 1 tablet (10 mg total) by mouth 2 (two) times daily. 02/13/21   Hayden Pedro, PA-C  metoprolol succinate (TOPROL-XL) 25 MG 24 hr tablet Take 25 mg by mouth daily. 06/23/21   [provider]  Multiple Vitamin (MULTIVITAMIN WITH MINERALS) TABS tablet Take 1 tablet by mouth daily. 07/10/21   Raiford Noble Latif, DO  nystatin (MYCOSTATIN) 100000 UNIT/ML suspension Take 5 mLs (500,000 Units total) by mouth 4 (four) times daily. Patient not taking: Reported on 07/06/2021 12/16/20   Tyler Pita, MD  omeprazole (PRILOSEC) 20 MG capsule Take 20 mg by mouth daily before breakfast.  08/27/17   [provider]  ondansetron (ZOFRAN-ODT) 8 MG disintegrating  tablet Take 8 mg by mouth daily as needed for nausea or vomiting. 10/13/20   [provider]  Polyethyl Glycol-Propyl Glycol (SYSTANE) 0.4-0.3 % GEL ophthalmic gel Place 1 Application into both eyes daily as needed (irritation).    [provider]  polyethylene glycol (MIRALAX / GLYCOLAX) packet Take 17 g by mouth in the morning.    [provider]  prochlorperazine (COMPAZINE) 10 MG tablet Take 1 tablet (10 mg total) by mouth every 6 (six) hours as needed for nausea or vomiting. 10/27/20   Curt Bears, MD  protein supplement (RESOURCE BENEPROTEIN) 6 g POWD Take 1 Scoop (6 g total) by mouth 3 (three) times daily with meals. 07/09/21   Raiford Noble Latif, DO  rosuvastatin (CRESTOR) 5 MG tablet Take 5 mg by mouth daily. Take 1 Tablet Daily    [provider]  sodium chloride 1 g tablet Take 1 g by mouth 2 (two) times daily. 06/23/21   [provider]  traMADol (ULTRAM) 50 MG tablet Take 50 mg by mouth 3 (three) times daily as needed for severe pain. 10/08/19   [provider]  zaleplon (SONATA) 10 MG capsule Take 10 mg by  mouth at bedtime as needed for sleep.    [provider]      Allergies    Amoxicillin-pot clavulanate, Doxycycline, Levaquin [levofloxacin in d5w], Magnesium hydroxide, Oxycodone, and Other    Review of Systems   Review of Systems  Constitutional:  Negative for fever.  Respiratory:  Positive for cough and shortness of breath.   Cardiovascular:  Positive for chest pain. Negative for leg swelling.  Gastrointestinal:  Positive for constipation. Negative for abdominal pain, diarrhea and vomiting.  Genitourinary:  Negative for dysuria.  Musculoskeletal:  Positive for back pain.  Neurological:  Positive for weakness (generalized). Negative for numbness.    Physical Exam Updated Vital Signs BP 128/85   Pulse 65   Temp 98.3 F (36.8 C) (Oral)   Resp 19   Ht 5\' 7"  (1.702 m)   Wt 73.5 kg   LMP  (LMP Unknown)   SpO2 97%   BMI 25.37 kg/m  Physical Exam Vitals and nursing note reviewed.  Constitutional:      General: She is not in acute distress.    Appearance: She is well-developed. She is not ill-appearing or diaphoretic.  HENT:     Head: Normocephalic and atraumatic.  Cardiovascular:     Rate and Rhythm: Normal rate and regular rhythm.     Heart sounds: Normal heart sounds.  Pulmonary:     Effort: Pulmonary effort is normal.     Breath sounds: Wheezing (diffuse, expiratory, mild) present.  Abdominal:     Palpations: Abdomen is soft.     Tenderness: There is abdominal tenderness (mild, non-focal).  Musculoskeletal:     Cervical back: No tenderness.     Thoracic back: No tenderness.     Lumbar back: No tenderness.  Skin:    General: Skin is warm and dry.  Neurological:     Mental Status: She is alert.     Comments: Mild weakness, symmetric, in lower extremities, but still technically 5/5. Grossly normal sensation. 5/5 strength in BUE.     ED Results / Procedures / Treatments   Labs (all labs ordered are listed, but only abnormal results are displayed) Labs  Reviewed  URINALYSIS, ROUTINE W REFLEX MICROSCOPIC  COMPREHENSIVE METABOLIC PANEL  LIPASE, BLOOD  CBC WITH DIFFERENTIAL/PLATELET  BRAIN NATRIURETIC PEPTIDE  TROPONIN I (HIGH  SENSITIVITY)    EKG None  Radiology No results found.  Procedures Procedures    Medications Ordered in ED Medications  ipratropium-albuterol (DUONEB) 0.5-2.5 (3) MG/3ML nebulizer solution 3 mL (has no administration in time range)    ED Course/ Medical Decision Making/ A&P                           Medical Decision Making Amount and/or Complexity of Data Reviewed Labs: ordered.    Details: Urinalysis without UTI.  Metabolic panel unremarkable.  Chronic anemia and normal WBC. Radiology: ordered and independent interpretation performed.    Details: Chest x-ray with chronic pleural effusion, no pneumonia.  CT with dilated bowels but no SBO. ECG/medicine tests: ordered and independent interpretation performed.    Details: no acute ischemia  Risk Prescription drug management.   Given it seems like patient had weakness in the legs worse in the arms and back pain with her history of cancer, MRI with and without contrast obtained. This is currently pending  From a cough perspective it seems like this is probably just from a chronic pleural effusion though there are no emergent findings and it does not seem like an acute pneumonia.  She had chest pain yesterday but normal troponin here and no ischemia on EKG.  Constipation is chronic problem. Given the narrowing, will need to follow up with GI. She's had a colonoscopy before, but she's not sure by who.  Care to Dr. Leonette Monarch.       Final Clinical Impression(s) / ED Diagnoses Final diagnoses:  None    Rx / DC Orders ED Discharge Orders     None         Sherwood Gambler, MD 08/03/21 2314

## 2021-08-03 NOTE — ED Notes (Signed)
Pt. currently at MRI .  
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
## 2021-08-03 NOTE — Discharge Instructions (Signed)
If you develop worsening, recurrent, or continued back pain, numbness or weakness in the legs, incontinence of your bowels or bladders, numbness of your buttocks, fever, abdominal pain, or any other new/concerning symptoms then return to the ER for evaluation.  

## 2021-08-03 NOTE — ED Notes (Signed)
Pt has returned from MRI. 

## 2021-08-04 NOTE — ED Notes (Signed)
Dr. Cardama at bedside.  

## 2021-08-04 NOTE — ED Notes (Signed)
Pt A&OX4, pt verbalized understanding of d/c instructions and follow up care.
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
## 2021-08-07 DIAGNOSIS — F324 Major depressive disorder, single episode, in partial remission: Secondary | ICD-10-CM | POA: Diagnosis not present

## 2021-08-07 DIAGNOSIS — I10 Essential (primary) hypertension: Secondary | ICD-10-CM | POA: Diagnosis not present

## 2021-08-07 DIAGNOSIS — E7800 Pure hypercholesterolemia, unspecified: Secondary | ICD-10-CM | POA: Diagnosis not present

## 2021-08-07 DIAGNOSIS — K219 Gastro-esophageal reflux disease without esophagitis: Secondary | ICD-10-CM | POA: Diagnosis not present

## 2021-08-07 DIAGNOSIS — I5033 Acute on chronic diastolic (congestive) heart failure: Secondary | ICD-10-CM | POA: Diagnosis not present

## 2021-08-08 DIAGNOSIS — R2681 Unsteadiness on feet: Secondary | ICD-10-CM | POA: Diagnosis not present

## 2021-08-08 DIAGNOSIS — R29898 Other symptoms and signs involving the musculoskeletal system: Secondary | ICD-10-CM | POA: Diagnosis not present

## 2021-08-08 DIAGNOSIS — S22060A Wedge compression fracture of T7-T8 vertebra, initial encounter for closed fracture: Secondary | ICD-10-CM | POA: Diagnosis not present

## 2021-08-18 DIAGNOSIS — M546 Pain in thoracic spine: Secondary | ICD-10-CM | POA: Diagnosis not present

## 2021-08-18 DIAGNOSIS — S22060G Wedge compression fracture of T7-T8 vertebra, subsequent encounter for fracture with delayed healing: Secondary | ICD-10-CM | POA: Diagnosis not present

## 2021-08-23 ENCOUNTER — Other Ambulatory Visit: Payer: Self-pay | Admitting: Neurosurgery

## 2021-08-23 DIAGNOSIS — S2231XA Fracture of one rib, right side, initial encounter for closed fracture: Secondary | ICD-10-CM | POA: Diagnosis not present

## 2021-08-23 DIAGNOSIS — R0781 Pleurodynia: Secondary | ICD-10-CM | POA: Diagnosis not present

## 2021-08-28 DIAGNOSIS — E7800 Pure hypercholesterolemia, unspecified: Secondary | ICD-10-CM | POA: Diagnosis not present

## 2021-08-28 DIAGNOSIS — I10 Essential (primary) hypertension: Secondary | ICD-10-CM | POA: Diagnosis not present

## 2021-08-28 DIAGNOSIS — K219 Gastro-esophageal reflux disease without esophagitis: Secondary | ICD-10-CM | POA: Diagnosis not present

## 2021-08-28 DIAGNOSIS — I5033 Acute on chronic diastolic (congestive) heart failure: Secondary | ICD-10-CM | POA: Diagnosis not present

## 2021-08-31 DIAGNOSIS — C3492 Malignant neoplasm of unspecified part of left bronchus or lung: Secondary | ICD-10-CM | POA: Diagnosis not present

## 2021-08-31 DIAGNOSIS — R4781 Slurred speech: Secondary | ICD-10-CM | POA: Diagnosis not present

## 2021-08-31 DIAGNOSIS — I471 Supraventricular tachycardia: Secondary | ICD-10-CM | POA: Diagnosis not present

## 2021-08-31 DIAGNOSIS — R8281 Pyuria: Secondary | ICD-10-CM | POA: Diagnosis not present

## 2021-08-31 DIAGNOSIS — R410 Disorientation, unspecified: Secondary | ICD-10-CM | POA: Diagnosis not present

## 2021-08-31 DIAGNOSIS — N390 Urinary tract infection, site not specified: Secondary | ICD-10-CM | POA: Diagnosis not present

## 2021-08-31 DIAGNOSIS — S2239XA Fracture of one rib, unspecified side, initial encounter for closed fracture: Secondary | ICD-10-CM | POA: Diagnosis not present

## 2021-08-31 DIAGNOSIS — S22060A Wedge compression fracture of T7-T8 vertebra, initial encounter for closed fracture: Secondary | ICD-10-CM | POA: Diagnosis not present

## 2021-09-01 ENCOUNTER — Ambulatory Visit (HOSPITAL_COMMUNITY)
Admission: RE | Admit: 2021-09-01 | Discharge: 2021-09-01 | Disposition: A | Discharge status: Home / Self Care | Payer: Medicare HMO | Source: Ambulatory Visit | Attending: Internal Medicine | Admitting: Internal Medicine

## 2021-09-01 ENCOUNTER — Other Ambulatory Visit: Payer: Self-pay

## 2021-09-01 ENCOUNTER — Inpatient Hospital Stay: Payer: Medicare HMO | Attending: Internal Medicine

## 2021-09-01 DIAGNOSIS — R7989 Other specified abnormal findings of blood chemistry: Secondary | ICD-10-CM

## 2021-09-01 DIAGNOSIS — C3490 Malignant neoplasm of unspecified part of unspecified bronchus or lung: Secondary | ICD-10-CM

## 2021-09-01 DIAGNOSIS — J90 Pleural effusion, not elsewhere classified: Secondary | ICD-10-CM | POA: Diagnosis not present

## 2021-09-01 DIAGNOSIS — I700 Atherosclerosis of aorta: Secondary | ICD-10-CM | POA: Diagnosis not present

## 2021-09-01 DIAGNOSIS — Z79899 Other long term (current) drug therapy: Secondary | ICD-10-CM | POA: Insufficient documentation

## 2021-09-01 DIAGNOSIS — J439 Emphysema, unspecified: Secondary | ICD-10-CM | POA: Diagnosis not present

## 2021-09-01 DIAGNOSIS — C3432 Malignant neoplasm of lower lobe, left bronchus or lung: Secondary | ICD-10-CM | POA: Insufficient documentation

## 2021-09-01 LAB — CBC WITH DIFFERENTIAL (CANCER CENTER ONLY)
Abs Immature Granulocytes: 0.01 10*3/uL (ref 0.00–0.07)
Basophils Absolute: 0 10*3/uL (ref 0.0–0.1)
Basophils Relative: 0 %
Eosinophils Absolute: 0.1 10*3/uL (ref 0.0–0.5)
Eosinophils Relative: 2 %
HCT: 31 % — ABNORMAL LOW (ref 36.0–46.0)
Hemoglobin: 11 g/dL — ABNORMAL LOW (ref 12.0–15.0)
Immature Granulocytes: 0 %
Lymphocytes Relative: 6 %
Lymphs Abs: 0.3 10*3/uL — ABNORMAL LOW (ref 0.7–4.0)
MCH: 37 pg — ABNORMAL HIGH (ref 26.0–34.0)
MCHC: 35.5 g/dL (ref 30.0–36.0)
MCV: 104.4 fL — ABNORMAL HIGH (ref 80.0–100.0)
Monocytes Absolute: 0.4 10*3/uL (ref 0.1–1.0)
Monocytes Relative: 7 %
Neutro Abs: 4.1 10*3/uL (ref 1.7–7.7)
Neutrophils Relative %: 85 %
Platelet Count: 158 10*3/uL (ref 150–400)
RBC: 2.97 MIL/uL — ABNORMAL LOW (ref 3.87–5.11)
RDW: 13.5 % (ref 11.5–15.5)
WBC Count: 5 10*3/uL (ref 4.0–10.5)
nRBC: 0 % (ref 0.0–0.2)

## 2021-09-01 LAB — CMP (CANCER CENTER ONLY)
ALT: 42 U/L (ref 0–44)
AST: 47 U/L — ABNORMAL HIGH (ref 15–41)
Albumin: 4.1 g/dL (ref 3.5–5.0)
Alkaline Phosphatase: 104 U/L (ref 38–126)
Anion gap: 7 (ref 5–15)
BUN: 14 mg/dL (ref 8–23)
CO2: 32 mmol/L (ref 22–32)
Calcium: 9.4 mg/dL (ref 8.9–10.3)
Chloride: 90 mmol/L — ABNORMAL LOW (ref 98–111)
Creatinine: 0.62 mg/dL (ref 0.44–1.00)
GFR, Estimated: >60 mL/min (ref >60)
Glucose, Bld: 101 mg/dL — ABNORMAL HIGH (ref 70–99)
Potassium: 4 mmol/L (ref 3.5–5.1)
Sodium: 129 mmol/L — ABNORMAL LOW (ref 135–145)
Total Bilirubin: 0.4 mg/dL (ref 0.3–1.2)
Total Protein: 7.1 g/dL (ref 6.5–8.1)

## 2021-09-01 LAB — FERRITIN: Ferritin: 827 ng/mL — ABNORMAL HIGH (ref 11–307)

## 2021-09-01 MED ORDER — IOHEXOL 300 MG/ML  SOLN
75.0000 mL | Freq: Once | INTRAMUSCULAR | Status: AC | PRN
  Administered 2021-09-01: 75 mL via INTRAVENOUS

## 2021-09-01 MED ORDER — SODIUM CHLORIDE (PF) 0.9 % IJ SOLN
INTRAMUSCULAR | Status: AC
  Filled 2021-09-01: qty 50

## 2021-09-04 ENCOUNTER — Telehealth: Payer: Self-pay

## 2021-09-04 ENCOUNTER — Emergency Department (HOSPITAL_COMMUNITY): Payer: Medicare HMO

## 2021-09-04 ENCOUNTER — Emergency Department (HOSPITAL_COMMUNITY)
Admission: EM | Admit: 2021-09-04 | Discharge: 2021-09-05 | Disposition: A | Discharge status: Home / Self Care | Payer: Medicare HMO | Attending: Emergency Medicine | Admitting: Emergency Medicine

## 2021-09-04 ENCOUNTER — Encounter (HOSPITAL_COMMUNITY): Payer: Self-pay | Admitting: Emergency Medicine

## 2021-09-04 DIAGNOSIS — Z85118 Personal history of other malignant neoplasm of bronchus and lung: Secondary | ICD-10-CM | POA: Insufficient documentation

## 2021-09-04 DIAGNOSIS — I509 Heart failure, unspecified: Secondary | ICD-10-CM | POA: Diagnosis not present

## 2021-09-04 DIAGNOSIS — J439 Emphysema, unspecified: Secondary | ICD-10-CM | POA: Diagnosis not present

## 2021-09-04 DIAGNOSIS — J9811 Atelectasis: Secondary | ICD-10-CM | POA: Diagnosis not present

## 2021-09-04 DIAGNOSIS — R059 Cough, unspecified: Secondary | ICD-10-CM | POA: Diagnosis not present

## 2021-09-04 DIAGNOSIS — R0600 Dyspnea, unspecified: Secondary | ICD-10-CM

## 2021-09-04 DIAGNOSIS — Z7901 Long term (current) use of anticoagulants: Secondary | ICD-10-CM | POA: Insufficient documentation

## 2021-09-04 DIAGNOSIS — R0602 Shortness of breath: Secondary | ICD-10-CM | POA: Diagnosis not present

## 2021-09-04 DIAGNOSIS — J90 Pleural effusion, not elsewhere classified: Secondary | ICD-10-CM | POA: Diagnosis not present

## 2021-09-04 DIAGNOSIS — Z79899 Other long term (current) drug therapy: Secondary | ICD-10-CM | POA: Insufficient documentation

## 2021-09-04 DIAGNOSIS — I110 Hypertensive heart disease with heart failure: Secondary | ICD-10-CM | POA: Insufficient documentation

## 2021-09-04 DIAGNOSIS — I700 Atherosclerosis of aorta: Secondary | ICD-10-CM | POA: Diagnosis not present

## 2021-09-04 LAB — COMPREHENSIVE METABOLIC PANEL
ALT: 53 U/L — ABNORMAL HIGH (ref 0–44)
AST: 48 U/L — ABNORMAL HIGH (ref 15–41)
Albumin: 3.6 g/dL (ref 3.5–5.0)
Alkaline Phosphatase: 94 U/L (ref 38–126)
Anion gap: 11 (ref 5–15)
BUN: 10 mg/dL (ref 8–23)
CO2: 28 mmol/L (ref 22–32)
Calcium: 8.9 mg/dL (ref 8.9–10.3)
Chloride: 92 mmol/L — ABNORMAL LOW (ref 98–111)
Creatinine, Ser: 0.67 mg/dL (ref 0.44–1.00)
GFR, Estimated: >60 mL/min (ref >60)
Glucose, Bld: 112 mg/dL — ABNORMAL HIGH (ref 70–99)
Potassium: 3.9 mmol/L (ref 3.5–5.1)
Sodium: 131 mmol/L — ABNORMAL LOW (ref 135–145)
Total Bilirubin: 0.6 mg/dL (ref 0.3–1.2)
Total Protein: 6.8 g/dL (ref 6.5–8.1)

## 2021-09-04 LAB — CBC WITH DIFFERENTIAL/PLATELET
Abs Immature Granulocytes: 0.03 10*3/uL (ref 0.00–0.07)
Basophils Absolute: 0 10*3/uL (ref 0.0–0.1)
Basophils Relative: 0 %
Eosinophils Absolute: 0.1 10*3/uL (ref 0.0–0.5)
Eosinophils Relative: 1 %
HCT: 31.4 % — ABNORMAL LOW (ref 36.0–46.0)
Hemoglobin: 10.6 g/dL — ABNORMAL LOW (ref 12.0–15.0)
Immature Granulocytes: 1 %
Lymphocytes Relative: 4 %
Lymphs Abs: 0.2 10*3/uL — ABNORMAL LOW (ref 0.7–4.0)
MCH: 36.7 pg — ABNORMAL HIGH (ref 26.0–34.0)
MCHC: 33.8 g/dL (ref 30.0–36.0)
MCV: 108.7 fL — ABNORMAL HIGH (ref 80.0–100.0)
Monocytes Absolute: 0.3 10*3/uL (ref 0.1–1.0)
Monocytes Relative: 6 %
Neutro Abs: 4.6 10*3/uL (ref 1.7–7.7)
Neutrophils Relative %: 88 %
Platelets: 135 10*3/uL — ABNORMAL LOW (ref 150–400)
RBC: 2.89 MIL/uL — ABNORMAL LOW (ref 3.87–5.11)
RDW: 13.8 % (ref 11.5–15.5)
WBC: 5.2 10*3/uL (ref 4.0–10.5)
nRBC: 0 % (ref 0.0–0.2)

## 2021-09-04 LAB — BRAIN NATRIURETIC PEPTIDE: B Natriuretic Peptide: 150.5 pg/mL — ABNORMAL HIGH (ref 0.0–100.0)

## 2021-09-04 LAB — TROPONIN I (HIGH SENSITIVITY): Troponin I (High Sensitivity): 4 ng/L (ref <18)

## 2021-09-04 MED ORDER — IOHEXOL 350 MG/ML SOLN
80.0000 mL | Freq: Once | INTRAVENOUS | Status: AC | PRN
  Administered 2021-09-04: 100 mL via INTRAVENOUS

## 2021-09-04 MED ORDER — SODIUM CHLORIDE (PF) 0.9 % IJ SOLN
INTRAMUSCULAR | Status: AC
  Filled 2021-09-04: qty 50

## 2021-09-04 NOTE — ED Notes (Signed)
Needs labs and IV

## 2021-09-04 NOTE — ED Notes (Signed)
Paperwork reviewed with patient and husband at bedside. [facility] called for transport.

## 2021-09-04 NOTE — Discharge Instructions (Signed)
Follow-up with Dr. Earlie Server tomorrow as previously scheduled.  Return for any problem.

## 2021-09-04 NOTE — Telephone Encounter (Signed)
Dana w/[facility] LVM stating the pts respiration is 20. Pt is on 3L of O2 with sat 100%. She feels the pts breathing is shallow and feels the last two times she has been to the ER, nothing has been done.  I have spoken with the Hinton Dyer and confirmed the aforementioned and described the pts breathing as "stridor". Hinton Dyer was advised that pt needs to present to the ER as soon as possible. She is aware pt has appt tomorrow afternoon but has been strongly encouraged to have the pt present to the ER again for evaluation. She expressed understanding of this information and advised she will "try to convince" the pt to go back. I further advised for her to call EMS if needed.

## 2021-09-04 NOTE — ED Provider Notes (Signed)
[facility] DEPT Provider Note   CSN: [id number] Arrival date & time: 09/04/21  1842     History  Chief Complaint  Patient presents with   Shortness of Breath    Patricia [name] is a 71 y.o. female.  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  Patient reports symptoms have worsened over the last several days.  Patient reports recent falls with resulting rib fractures and compression fracture.  She denies recent fever.  She reports mild cough.  She appears comfortable at time of evaluation.  The history is provided by the patient and medical records.  Shortness of Breath Severity:  Moderate Onset quality:  Gradual Duration:  3 days Timing:  Constant Progression:  Worsening      Home Medications Prior to Admission medications   Medication Sig Start Date End Date Taking? Authorizing Provider  acetaminophen (TYLENOL) 325 MG tablet Take 650 mg by mouth every 6 (six) hours as needed for headache or moderate pain.    [provider]  albuterol (VENTOLIN HFA) 108 (90 Base) MCG/ACT inhaler Inhale 2 puffs into the lungs every 6 (six) hours as needed for shortness of breath. 03/04/21   [provider]  amiodarone (PACERONE) 200 MG tablet Take 1 tablet (200 mg total) by mouth daily. 04/24/21   Camnitz, Ocie Doyne, MD  apixaban (ELIQUIS) 5 MG TABS tablet TAKE 1 TABLET(5 MG) BY MOUTH TWICE DAILY Patient taking differently: Take 5 mg by mouth 2 (two) times daily. 06/12/21   Lelon Perla, MD  Black Cohosh (REMIFEMIN PO) Take 1 tablet by mouth 2 (two) times daily.    [provider]  carboxymethylcellulose (REFRESH  PLUS) 0.5 % SOLN Place 1 drop into both eyes 3 (three) times daily as needed (for dryness).     [provider]  carisoprodol (SOMA) 350 MG tablet Take 350 mg by mouth 2 (two) times daily. 09/26/17   [provider]  Cholecalciferol (VITAMIN D3 PO) Take 2 capsules by mouth daily.    [provider]  clobetasol ointment (TEMOVATE) 5.57 % Apply 1 application. topically daily as needed (vaginal irritation).    [provider]  clonazePAM (KLONOPIN) 1 MG tablet Take 1 mg by mouth at bedtime.  10/15/17   [provider]  Coenzyme Q10 200 MG capsule Take 400 mg by mouth daily.    [provider]  Cyanocobalamin (B-12 PO) Take 2 capsules by mouth daily.    [provider]  docusate sodium (COLACE) 100 MG capsule Take 1 capsule (100 mg total) by mouth 2 (two) times daily. Patient taking differently: Take 100 mg by mouth daily. 01/03/21   Pokhrel, Corrie Mckusick, MD  escitalopram (LEXAPRO) 10 MG tablet Take 1 tablet (10 mg total) by mouth daily. Patient taking differently: Take 20 mg by mouth daily. 01/04/21   Pokhrel, Corrie Mckusick, MD  feeding supplement (ENSURE ENLIVE / ENSURE PLUS) LIQD Take 237 mLs by mouth daily. 07/09/21   Sheikh, Omair Latif, DO  fluticasone (FLONASE) 50 MCG/ACT nasal spray Place 2 sprays into both nostrils every evening. 10/11/17   [provider]  furosemide (LASIX) 20 MG tablet Take 1 tablet (20 mg total) by mouth daily. Take only if weight goes above 180 lbs. Patient  taking differently: Take 20 mg by mouth daily as needed for fluid (weight gain of 3 - 4 lbs overnight). 02/21/21 07/06/21  Arrien, Jimmy Picket, MD  gabapentin (NEURONTIN) 600 MG tablet Take 600 mg by mouth 2 (two) times daily. in the morning and at lunchtime 09/16/17   [provider]  guaiFENesin-dextromethorphan (ROBITUSSIN DM) 100-10 MG/5ML syrup Take 10 mLs by mouth every 4 (four) hours as needed for cough. 01/03/21   Pokhrel, Corrie Mckusick, MD   HYDROcodone-acetaminophen (NORCO/VICODIN) 5-325 MG tablet Take 2 tablets by mouth every 4 (four) hours as needed. 07/26/21   Sponseller, Gypsy Balsam, PA-C  hydrOXYzine (ATARAX) 25 MG tablet Take 25 mg by mouth at bedtime as needed for sleep or anxiety. 03/05/21   [provider]  ipratropium (ATROVENT) 0.02 % nebulizer solution Take 0.5 mg by nebulization 3 (three) times daily. As directed 01/05/21   [provider]  levalbuterol Penne Lash) 1.25 MG/3ML nebulizer solution Inhale 1.25 mg into the lungs 3 (three) times daily. 01/10/21   [provider]  lidocaine (LIDODERM) 5 % Place 1 patch onto the skin daily. Remove & Discard patch within 12 hours or as directed by MD 07/10/21   Raiford Noble Latif, DO  lidocaine (XYLOCAINE) 2 % solution Use as directed 15 mLs in the mouth or throat every 6 (six) hours as needed for mouth pain. Patient not taking: Reported on 07/06/2021 12/18/20   Sherwood Gambler, MD  linaclotide [facility]) 145 MCG CAPS capsule Take 1 capsule (145 mcg total) by mouth daily at 4 PM. Patient taking differently: Take 145 mcg by mouth daily as needed (laxative). 10/03/20   Debbe Odea, MD  magic mouthwash SOLN Take 5 mLs by mouth 4 (four) times daily as needed for mouth pain. Pt is allergic to Magnesium Patient not taking: Reported on 07/06/2021 11/04/20   Sherol Dade E, PA-C  magnesium oxide (MAG-OX) 400 (240 Mg) MG tablet Take 1 tablet (400 mg total) by mouth daily. Patient not taking: Reported on 07/06/2021 11/08/20   Heilingoetter, Cassandra L, PA-C  memantine (NAMENDA) 10 MG tablet Take 1 tablet (10 mg total) by mouth 2 (two) times daily. 02/13/21   Hayden Pedro, PA-C  metoprolol succinate (TOPROL-XL) 25 MG 24 hr tablet Take 25 mg by mouth daily. 06/23/21   [provider]  Multiple Vitamin (MULTIVITAMIN WITH MINERALS) TABS tablet Take 1 tablet by mouth daily. 07/10/21   Raiford Noble Latif, DO  nystatin (MYCOSTATIN) 100000 UNIT/ML suspension  Take 5 mLs (500,000 Units total) by mouth 4 (four) times daily. Patient not taking: Reported on 07/06/2021 12/16/20   Tyler Pita, MD  omeprazole (PRILOSEC) 20 MG capsule Take 20 mg by mouth daily before breakfast.  08/27/17   [provider]  ondansetron (ZOFRAN-ODT) 8 MG disintegrating tablet Take 8 mg by mouth daily as needed for nausea or vomiting. 10/13/20   [provider]  Polyethyl Glycol-Propyl Glycol (SYSTANE) 0.4-0.3 % GEL ophthalmic gel Place 1 Application into both eyes daily as needed (irritation).    [provider]  polyethylene glycol (MIRALAX / GLYCOLAX) packet Take 17 g by mouth in the morning.    [provider]  prochlorperazine (COMPAZINE) 10 MG tablet Take 1 tablet (10 mg total) by mouth every 6 (six) hours as needed for nausea or vomiting. 10/27/20   Curt Bears, MD  protein supplement (RESOURCE BENEPROTEIN) 6 g POWD Take 1 Scoop (6 g total) by mouth 3 (three) times daily with meals. 07/09/21   Kerney Elbe, DO  rosuvastatin (CRESTOR) 5 MG tablet Take 5 mg by mouth daily. Take 1 Tablet Daily    [provider]  sodium chloride 1 g tablet Take 1 g by mouth 2 (two) times daily. 06/23/21   [provider]  traMADol (ULTRAM) 50 MG tablet Take 50 mg by mouth 3 (three) times daily as needed for severe pain. 10/08/19   [provider]  zaleplon (SONATA) 10 MG capsule Take 10 mg by mouth at bedtime as needed for sleep.    [provider]      Allergies    Amoxicillin-pot clavulanate, Doxycycline, Levaquin [levofloxacin in d5w], Magnesium hydroxide, Oxycodone, and Other    Review of Systems   Review of Systems  Respiratory:  Positive for shortness of breath.   All other systems reviewed and are negative.   Physical Exam Updated Vital Signs BP (!) 144/79 (BP Location: Right Arm)   Pulse 69   Temp 98.4 F (36.9 C) (Oral)   Resp 15   LMP  (LMP Unknown)   SpO2 99%  Physical Exam Vitals and  nursing note reviewed.  Constitutional:      General: She is not in acute distress.    Appearance: Normal appearance. She is well-developed.  HENT:     Head: Normocephalic and atraumatic.  Eyes:     Conjunctiva/sclera: Conjunctivae normal.     Pupils: Pupils are equal, round, and reactive to light.  Cardiovascular:     Rate and Rhythm: Normal rate and regular rhythm.     Heart sounds: Normal heart sounds.  Pulmonary:     Effort: Pulmonary effort is normal. No respiratory distress.     Breath sounds: Normal breath sounds.  Abdominal:     General: There is no distension.     Palpations: Abdomen is soft.     Tenderness: There is no abdominal tenderness.  Musculoskeletal:        General: No deformity. Normal range of motion.     Cervical back: Normal range of motion and neck supple.  Skin:    General: Skin is warm and dry.  Neurological:     General: No focal deficit present.     Mental Status: She is alert and oriented to person, place, and time.     ED Results / Procedures / Treatments   Labs (all labs ordered are listed, but only abnormal results are displayed) Labs Reviewed  CBC WITH DIFFERENTIAL/PLATELET - Abnormal; Notable for the following components:      Result Value   RBC 2.89 (*)    Hemoglobin 10.6 (*)    HCT 31.4 (*)    MCV 108.7 (*)    MCH 36.7 (*)    Platelets 135 (*)    Lymphs Abs 0.2 (*)    All other components within normal limits  COMPREHENSIVE METABOLIC PANEL - Abnormal; Notable for the following components:   Sodium 131 (*)    Chloride 92 (*)    Glucose, Bld 112 (*)    AST 48 (*)    ALT 53 (*)    All other components within normal limits  BRAIN NATRIURETIC PEPTIDE - Abnormal; Notable for the following components:   B Natriuretic Peptide 150.5 (*)    All other components within normal limits  TROPONIN I (HIGH SENSITIVITY)  TROPONIN I (HIGH SENSITIVITY)    EKG EKG Interpretation  Date/Time:  Monday September 04 2021 18:57:18 EDT Ventricular  Rate:  67 PR Interval:  207 QRS Duration: 93 QT Interval:  460 QTC  Calculation: 486 R Axis:   61 Text Interpretation: Sinus rhythm Low voltage, precordial leads Borderline prolonged QT interval Confirmed by Dene Gentry [phone]) on 09/04/2021 7:04:26 PM  Radiology CT Angio Chest PE W and/or Wo Contrast  Result Date: 09/04/2021 CLINICAL DATA:  Shortness of breath, cough, recent fall with 2 broken ribs and compression fracture, history of lung cancer EXAM: CT ANGIOGRAPHY CHEST WITH CONTRAST TECHNIQUE: Multidetector CT imaging of the chest was performed using the standard protocol during bolus administration of intravenous contrast. Multiplanar CT image reconstructions and MIPs were obtained to evaluate the vascular anatomy. RADIATION DOSE REDUCTION: This exam was performed according to the departmental dose-optimization program which includes automated exposure control, adjustment of the mA and/or kV according to patient size and/or use of iterative reconstruction technique. CONTRAST:  154mL OMNIPAQUE IOHEXOL 350 MG/ML SOLN COMPARISON:  Chest radiograph dated 09/04/2021. CT chest dated 09/01/2021. FINDINGS: Cardiovascular: Satisfactory opacification of the bilateral pulmonary arteries to the segmental level. No evidence of pulmonary embolism. Although not tailored for evaluation of the thoracic aorta, there is no evidence of thoracic aortic aneurysm or dissection. The heart is top-normal in size.  No pericardial effusion. Three vessel coronary sclerosis. Mediastinum/Nodes: 14 mm short axis subcarinal node, suspicious. 11 mm short axis right hilar node. Visualized thyroid is unremarkable. Lungs/Pleura: Moderate left and trace right pleural effusions. Associated bilateral lower lobe compressive atelectasis. Radiation changes in the left upper lobe. Moderate centrilobular and paraseptal emphysematous changes, upper lung predominant. No pneumothorax. Upper Abdomen: Visualized upper abdomen is notable for  vascular calcifications and vicarious excretion of contrast in the gallbladder. Musculoskeletal: Fractures of the right lateral 9th and 10th ribs, unchanged. Additional fracture of the right posteromedial 11th rib (series 4/image 110). Severe compression fracture deformity at T7, unchanged. Review of the MIP images confirms the above findings. IMPRESSION: No evidence of pulmonary embolism. Otherwise unchanged from recent CT. Moderate left and trace right pleural effusions. Associated bilateral lower lobe compressive atelectasis. Radiation changes in the left upper lobe. Mildly enlarged subcarinal and right hilar nodes, suspicious for nodal metastases. Right posterolateral 9th-11th rib fractures. Severe compression fracture deformity at T7. Aortic Atherosclerosis (ICD10-I70.0) and Emphysema (ICD10-J43.9). Electronically Signed   By: Julian Hy M.D.   On: 09/04/2021 20:27   DG Chest Port 1 View  Result Date: 09/04/2021 CLINICAL DATA:  Shortness of breath. EXAM: PORTABLE CHEST 1 VIEW COMPARISON:  August 23, 2021 FINDINGS: The heart size and mediastinal contours are within normal limits. There is moderate severity calcification of the aortic arch. There is stable opacification of left lung base with stable post treatment scarring seen along the left hilum. A stable moderate size left pleural effusion is also seen. The right lung is clear. No pneumothorax is identified. The visualized skeletal structures are unremarkable. IMPRESSION: 1. Stable post treatment scarring seen along the left hilum. 2. Stable moderate size left pleural effusion and suspected left basilar consolidation. Electronically Signed   By: Virgina Norfolk M.D.   On: 09/04/2021 19:20    Procedures Procedures    Medications Ordered in ED Medications - No data to display  ED Course/ Medical Decision Making/ A&P                           Medical Decision Making Amount and/or Complexity of Data Reviewed Labs: ordered. Radiology:  ordered.  Risk Prescription drug management.    Medical Screen Complete  This patient presented to the ED with complaint of sob.  This  complaint involves an extensive number of treatment options. The initial differential diagnosis includes, but is not limited to, infection, metabolic abnormality, pulmonary embolus, etc.  This presentation is: Acute, Chronic, Self-Limited, Previously Undiagnosed, Uncertain Prognosis, Complicated, Systemic Symptoms, and Threat to Life/Bodily Function  Patient with multiple comorbidities presents with complaint of shortness of breath.  Patient appears to be comfortable at time of initial exam.    History is suggestive that her complaints are somewhat chronic in nature.  Obtain screening labs are without significant acute abnormality.  Additionally CT imaging of the chest is without evidence of PE or other significant acute pathology.  Patient is reassured by her work-up here in the ED.  She and her spouse are comfortable with discharge home.  She has an appointment tomorrow with Dr. Earlie Server.  Importance of close follow-up is stressed.  Strict return precautions given and understood.  Additional history obtained:  External records from outside sources obtained and reviewed including prior ED visits and prior Inpatient records.    Lab Tests:  I ordered and personally interpreted labs.    Imaging Studies ordered:  I ordered imaging studies including chest x-ray, CTA PE I independently visualized and interpreted obtained imaging which showed NAD I agree with the radiologist interpretation.   Cardiac Monitoring:  The patient was maintained on a cardiac monitor.  I personally viewed and interpreted the cardiac monitor which showed an underlying rhythm of: NSR  Problem List / ED Course:  Dyspnea   Reevaluation:  After the interventions noted above, I reevaluated the patient and found that they have: improved   Disposition:  After  consideration of the diagnostic results and the patients response to treatment, I feel that the patent would benefit from close outpatient follow-up.          Final Clinical Impression(s) / ED Diagnoses Final diagnoses:  Dyspnea, unspecified type    Rx / DC Orders ED Discharge Orders     None         Valarie Merino, MD 09/04/21 2127

## 2021-09-04 NOTE — ED Triage Notes (Signed)
Per EMS, patient from home, c/o SOB x2 months worsening today. Non productive cough. Recent fall with two broken ribs and compression fracture. Lungs clear. Hx lung cancer last year.  3L [place] at baseline.   BP 140/90 HR 88 95% on 3L

## 2021-09-05 ENCOUNTER — Other Ambulatory Visit: Payer: Self-pay

## 2021-09-05 ENCOUNTER — Inpatient Hospital Stay (HOSPITAL_BASED_OUTPATIENT_CLINIC_OR_DEPARTMENT_OTHER): Payer: Medicare HMO | Admitting: Internal Medicine

## 2021-09-05 VITALS — BP 128/85 | HR 88 | Temp 97.7°F

## 2021-09-05 DIAGNOSIS — J90 Pleural effusion, not elsewhere classified: Secondary | ICD-10-CM | POA: Diagnosis not present

## 2021-09-05 DIAGNOSIS — C3490 Malignant neoplasm of unspecified part of unspecified bronchus or lung: Secondary | ICD-10-CM

## 2021-09-05 DIAGNOSIS — C3432 Malignant neoplasm of lower lobe, left bronchus or lung: Secondary | ICD-10-CM | POA: Diagnosis not present

## 2021-09-05 DIAGNOSIS — Z79899 Other long term (current) drug therapy: Secondary | ICD-10-CM | POA: Diagnosis not present

## 2021-09-05 NOTE — Progress Notes (Signed)
[facility] [phone]   [phone]  OFFICE PROGRESS NOTE  [name], MD [place] [address]  DIAGNOSIS:  Limited stage (T2a, N2, M0) small cell lung cancer presented with left lower lobe lung mass in addition to left infrahilar and subcarinal lymphadenopathy diagnosed in October 2022.   PRIOR THERAPY:  1) Systemic chemotherapy with cisplatin 80 mg/m2  on days 1 and etoposide 100 mg/m2 on days 1, 2, and 3 IV every 3 weeks. First dose 10/31/20.  Status post 4 cycles.  Her dose of cisplatin was reduced to 60 Mg/M2 from cycle #3 and starting cycle #4 of etoposide was also reduced to 80 Mg/M2 secondary to intolerance and pancytopenia. 2) prophylactic cranial irradiation under the care of of Dr. Lisbeth Renshaw   CURRENT THERAPY: Observation.  INTERVAL HISTORY: Apolonia Ellwood 71 y.o. female returns to the clinic today for follow-up visit accompanied by her husband.  The patient continues to complain of increasing fatigue and weakness as well as shortness of breath at baseline increased with exertion and she is currently on home oxygen.  She denied having any chest pain but has cough with no hemoptysis.  She has no nausea, vomiting, diarrhea or constipation.  She denied having any headache or visual changes.  She was seen recently at the emergency department and had CT angiogram of the chest performed and compared to the previous scan few days ago showed the persistent enlargement of the subcarinal lymph node as well as left pleural effusion.  She is here today for evaluation and recommendation regarding her condition.  MEDICAL HISTORY: Past Medical History:  Diagnosis Date   Anemia    Anxiety    GERD (gastroesophageal reflux disease)    Hyperlipidemia    Hypertension    IBS (irritable bowel syndrome)    Insomnia    Low back pain    Lung cancer ([place]) 10/2020   TIA (transient ischemic attack) 10/2017   no deficits    Vitamin B12 deficiency    Vitamin D deficiency     ALLERGIES:  is allergic to amoxicillin-pot clavulanate, doxycycline, levaquin [levofloxacin in d5w], magnesium hydroxide, oxycodone, and other.  MEDICATIONS:  Current Outpatient Medications  Medication Sig Dispense Refill   acetaminophen (TYLENOL) 325 MG tablet Take 650 mg by mouth every 6 (six) hours as needed for headache or moderate pain.     albuterol (VENTOLIN HFA) 108 (90 Base) MCG/ACT inhaler Inhale 2 puffs into the lungs every 6 (six) hours as needed for shortness of breath.     amiodarone (PACERONE) 200 MG tablet Take 1 tablet (200 mg total) by mouth daily. 90 tablet 3   apixaban (ELIQUIS) 5 MG TABS tablet TAKE 1 TABLET(5 MG) BY MOUTH TWICE DAILY 60 tablet 5   Black Cohosh (REMIFEMIN PO) Take 1 tablet by mouth 2 (two) times daily.     carboxymethylcellulose (REFRESH PLUS) 0.5 % SOLN Place 1 drop into both eyes 3 (three) times daily as needed (for dryness).      Cholecalciferol (VITAMIN D3 PO) Take 2 capsules by mouth daily.     clobetasol ointment (TEMOVATE) 5.00 % Apply 1 application. topically daily as needed (vaginal irritation).     clonazePAM (KLONOPIN) 1 MG tablet Take 1 mg by mouth at bedtime.   3   Coenzyme Q10 200 MG capsule Take 400 mg by mouth daily.     Cyanocobalamin (B-12 PO) Take 2 capsules by mouth daily.  docusate sodium (COLACE) 100 MG capsule Take 1 capsule (100 mg total) by mouth 2 (two) times daily. (Patient taking differently: Take 100 mg by mouth daily.) 10 capsule 0   escitalopram (LEXAPRO) 10 MG tablet Take 1 tablet (10 mg total) by mouth daily. (Patient not taking: Reported on 09/05/2021) 30 tablet 2   escitalopram (LEXAPRO) 20 MG tablet Take 20 mg by mouth daily.     feeding supplement (ENSURE ENLIVE / ENSURE PLUS) LIQD Take 237 mLs by mouth daily. (Patient taking differently: Take 237 mLs by mouth 2 (two) times daily between meals.) 237 mL 12   fluticasone (FLONASE) 50 MCG/ACT nasal spray Place 2 sprays  into both nostrils every evening.  5   furosemide (LASIX) 20 MG tablet Take 1 tablet (20 mg total) by mouth daily. Take only if weight goes above 180 lbs. (Patient taking differently: Take 20 mg by mouth daily as needed for fluid (weight gain of 3 - 4 lbs overnight).) 30 tablet 2   guaiFENesin-dextromethorphan (ROBITUSSIN DM) 100-10 MG/5ML syrup Take 10 mLs by mouth every 4 (four) hours as needed for cough. (Patient not taking: Reported on 09/05/2021) 118 mL 0   HYDROcodone-acetaminophen (NORCO) 10-325 MG tablet Take 1 tablet by mouth every 6 (six) hours as needed for pain.     HYDROcodone-acetaminophen (NORCO/VICODIN) 5-325 MG tablet Take 2 tablets by mouth every 4 (four) hours as needed. (Patient not taking: Reported on 09/05/2021) 15 tablet 0   hydrOXYzine (ATARAX) 25 MG tablet Take 25 mg by mouth at bedtime as needed for sleep or anxiety.     ipratropium (ATROVENT) 0.02 % nebulizer solution Take 0.5 mg by nebulization 3 (three) times daily as needed for shortness of breath or wheezing.     levalbuterol (XOPENEX) 1.25 MG/3ML nebulizer solution Inhale 1.25 mg into the lungs every 8 (eight) hours as needed for shortness of breath or wheezing.     lidocaine (LIDODERM) 5 % Place 1 patch onto the skin daily. Remove & Discard patch within 12 hours or as directed by MD (Patient not taking: Reported on 09/05/2021) 30 patch 0   lidocaine (XYLOCAINE) 2 % solution Use as directed 15 mLs in the mouth or throat every 6 (six) hours as needed for mouth pain. (Patient not taking: Reported on 07/06/2021) 100 mL 0   linaclotide (LINZESS) 145 MCG CAPS capsule Take 1 capsule (145 mcg total) by mouth daily at 4 PM. (Patient taking differently: Take 145 mcg by mouth daily as needed (laxative).) 30 capsule 0   magic mouthwash SOLN Take 5 mLs by mouth 4 (four) times daily as needed for mouth pain. Pt is allergic to Magnesium (Patient not taking: Reported on 07/06/2021) 240 mL 1   magnesium oxide (MAG-OX) 400 (240 Mg) MG tablet  Take 1 tablet (400 mg total) by mouth daily. (Patient not taking: Reported on 07/06/2021) 30 tablet 1   memantine (NAMENDA) 10 MG tablet Take 1 tablet (10 mg total) by mouth 2 (two) times daily. (Patient taking differently: Take 10 mg by mouth daily.) 60 tablet 4   metoprolol succinate (TOPROL-XL) 25 MG 24 hr tablet Take 25 mg by mouth daily.     Multiple Vitamin (MULTIVITAMIN WITH MINERALS) TABS tablet Take 1 tablet by mouth daily. 30 tablet 0   nystatin (MYCOSTATIN) 100000 UNIT/ML suspension Take 5 mLs (500,000 Units total) by mouth 4 (four) times daily. (Patient not taking: Reported on 07/06/2021) 440 mL 5   omeprazole (PRILOSEC) 20 MG capsule Take 20 mg by mouth daily  before breakfast.      Polyethyl Glycol-Propyl Glycol (SYSTANE) 0.4-0.3 % GEL ophthalmic gel Place 1 Application into both eyes daily as needed (irritation).     polyethylene glycol (MIRALAX / GLYCOLAX) packet Take 17 g by mouth in the morning.     prochlorperazine (COMPAZINE) 10 MG tablet Take 1 tablet (10 mg total) by mouth every 6 (six) hours as needed for nausea or vomiting. 30 tablet 0   protein supplement (RESOURCE BENEPROTEIN) 6 g POWD Take 1 Scoop (6 g total) by mouth 3 (three) times daily with meals. (Patient not taking: Reported on 09/05/2021) 30 packet 0   rosuvastatin (CRESTOR) 5 MG tablet Take 5 mg by mouth daily.     sodium chloride 1 g tablet Take 1 g by mouth 2 (two) times daily.     traMADol (ULTRAM) 50 MG tablet Take 50 mg by mouth 3 (three) times daily as needed for severe pain.     zaleplon (SONATA) 10 MG capsule Take 10 mg by mouth at bedtime as needed for sleep.     No current facility-administered medications for this visit.    SURGICAL HISTORY:  Past Surgical History:  Procedure Laterality Date   BACK SURGERY     BREAST EXCISIONAL BIOPSY Left 1997   benign   BRONCHIAL BIOPSY  10/18/2020   Procedure: BRONCHIAL BIOPSIES;  Surgeon: [name], DO;  Location: [facility];  Service: Pulmonary;;    BRONCHIAL BRUSHINGS  10/18/2020   Procedure: BRONCHIAL BRUSHINGS;  Surgeon: [name], DO;  Location: [facility];  Service: Pulmonary;;   BRONCHIAL NEEDLE ASPIRATION BIOPSY  10/18/2020   Procedure: BRONCHIAL NEEDLE ASPIRATION BIOPSIES;  Surgeon: [name], DO;  Location: [facility];  Service: Pulmonary;;   BRONCHIAL WASHINGS  10/18/2020   Procedure: BRONCHIAL WASHINGS;  Surgeon: [name], DO;  Location: [facility];  Service: Pulmonary;;   CARDIOVERSION N/A 02/13/2021   Procedure: CARDIOVERSION;  Surgeon: Werner Lean, MD;  Location: [facility];  Service: Cardiovascular;  Laterality: N/A;   CARDIOVERSION N/A 02/20/2021   Procedure: CARDIOVERSION;  Surgeon: Skeet Latch, MD;  Location: [facility];  Service: Cardiovascular;  Laterality: N/A;   CARDIOVERSION N/A 03/22/2021   Procedure: CARDIOVERSION;  Surgeon: Freada Bergeron, MD;  Location: [facility];  Service: Cardiovascular;  Laterality: N/A;   hemorrhoidecotmy     HERNIA MESH REMOVAL     OPEN REDUCTION INTERNAL FIXATION (ORIF) DISTAL RADIAL FRACTURE Right 11/19/2019   Procedure: OPEN REDUCTION INTERNAL FIXATION (ORIF) DISTAL RADIUS AND ULNA FRACTURE WITH REPAIR AS NECESSARY;  Surgeon: Roseanne Kaufman, MD;  Location: [facility];  Service: Orthopedics;  Laterality: Right;  2 hrs Block with IV sedation   ORIF RADIUS & ULNA FRACTURES     TONSILLECTOMY     VIDEO BRONCHOSCOPY WITH ENDOBRONCHIAL NAVIGATION Left 10/18/2020   Procedure: VIDEO BRONCHOSCOPY WITH ENDOBRONCHIAL NAVIGATION;  Surgeon: [name], DO;  Location: [facility];  Service: Pulmonary;  Laterality: Left;  ION   VIDEO BRONCHOSCOPY WITH ENDOBRONCHIAL ULTRASOUND Bilateral 10/18/2020   Procedure: VIDEO BRONCHOSCOPY WITH ENDOBRONCHIAL ULTRASOUND;  Surgeon: [name], DO;  Location: [facility];  Service: Pulmonary;  Laterality: Bilateral;   VIDEO BRONCHOSCOPY WITH RADIAL ENDOBRONCHIAL ULTRASOUND  10/18/2020   Procedure: VIDEO  BRONCHOSCOPY WITH RADIAL ENDOBRONCHIAL ULTRASOUND;  Surgeon: [name], DO;  Location: [facility];  Service: Pulmonary;;    REVIEW OF SYSTEMS:  Constitutional: positive for anorexia, fatigue, and weight loss Eyes: negative Ears, nose, mouth, throat, and face: negative Respiratory: positive for  cough and dyspnea on exertion Cardiovascular: negative Gastrointestinal: negative Genitourinary:negative Integument/breast: negative Hematologic/lymphatic: negative Musculoskeletal:positive for muscle weakness Neurological: negative Behavioral/Psych: negative Endocrine: negative Allergic/Immunologic: negative   PHYSICAL EXAMINATION: General appearance: alert, cooperative, fatigued, and no distress Head: Normocephalic, without obvious abnormality, atraumatic Neck: no adenopathy, no JVD, supple, symmetrical, trachea midline, and thyroid not enlarged, symmetric, no tenderness/mass/nodules Lymph nodes: Cervical, supraclavicular, and axillary nodes normal. Resp: diminished breath sounds LLL and dullness to percussion LLL Back: symmetric, no curvature. ROM normal. No CVA tenderness. Cardio: regular rate and rhythm, S1, S2 normal, no murmur, click, rub or gallop GI: soft, non-tender; bowel sounds normal; no masses,  no organomegaly Extremities: extremities normal, atraumatic, no cyanosis or edema Neurologic: Alert and oriented X 3, normal strength and tone. Normal symmetric reflexes. Normal coordination and gait  ECOG PERFORMANCE STATUS: 1 - Symptomatic but completely ambulatory  Blood pressure 128/85, pulse 88, temperature 97.7 F (36.5 C), temperature source Tympanic, SpO2 95 %.  LABORATORY DATA: Lab Results  Component Value Date   WBC 5.2 09/04/2021   HGB 10.6 (L) 09/04/2021   HCT 31.4 (L) 09/04/2021   MCV 108.7 (H) 09/04/2021   PLT 135 (L) 09/04/2021      Chemistry      Component Value Date/Time   NA 131 (L) 09/04/2021 1858   NA 133 (L) 03/17/2021 1152   K 3.9 09/04/2021  1858   CL 92 (L) 09/04/2021 1858   CO2 28 09/04/2021 1858   BUN 10 09/04/2021 1858   BUN 13 03/17/2021 1152   CREATININE 0.67 09/04/2021 1858   CREATININE 0.62 09/01/2021 1325      Component Value Date/Time   CALCIUM 8.9 09/04/2021 1858   ALKPHOS 94 09/04/2021 1858   AST 48 (H) 09/04/2021 1858   AST 47 (H) 09/01/2021 1325   ALT 53 (H) 09/04/2021 1858   ALT 42 09/01/2021 1325   BILITOT 0.6 09/04/2021 1858   BILITOT 0.4 09/01/2021 1325       RADIOGRAPHIC STUDIES: CT Angio Chest PE W and/or Wo Contrast  Result Date: 09/04/2021 CLINICAL DATA:  Shortness of breath, cough, recent fall with 2 broken ribs and compression fracture, history of lung cancer EXAM: CT ANGIOGRAPHY CHEST WITH CONTRAST TECHNIQUE: Multidetector CT imaging of the chest was performed using the standard protocol during bolus administration of intravenous contrast. Multiplanar CT image reconstructions and MIPs were obtained to evaluate the vascular anatomy. RADIATION DOSE REDUCTION: This exam was performed according to the departmental dose-optimization program which includes automated exposure control, adjustment of the mA and/or kV according to patient size and/or use of iterative reconstruction technique. CONTRAST:  130mL OMNIPAQUE IOHEXOL 350 MG/ML SOLN COMPARISON:  Chest radiograph dated 09/04/2021. CT chest dated 09/01/2021. FINDINGS: Cardiovascular: Satisfactory opacification of the bilateral pulmonary arteries to the segmental level. No evidence of pulmonary embolism. Although not tailored for evaluation of the thoracic aorta, there is no evidence of thoracic aortic aneurysm or dissection. The heart is top-normal in size.  No pericardial effusion. Three vessel coronary sclerosis. Mediastinum/Nodes: 14 mm short axis subcarinal node, suspicious. 11 mm short axis right hilar node. Visualized thyroid is unremarkable. Lungs/Pleura: Moderate left and trace right pleural effusions. Associated bilateral lower lobe compressive  atelectasis. Radiation changes in the left upper lobe. Moderate centrilobular and paraseptal emphysematous changes, upper lung predominant. No pneumothorax. Upper Abdomen: Visualized upper abdomen is notable for vascular calcifications and vicarious excretion of contrast in the gallbladder. Musculoskeletal: Fractures of the right lateral 9th and 10th ribs, unchanged. Additional fracture of the right  posteromedial 11th rib (series 4/image 110). Severe compression fracture deformity at T7, unchanged. Review of the MIP images confirms the above findings. IMPRESSION: No evidence of pulmonary embolism. Otherwise unchanged from recent CT. Moderate left and trace right pleural effusions. Associated bilateral lower lobe compressive atelectasis. Radiation changes in the left upper lobe. Mildly enlarged subcarinal and right hilar nodes, suspicious for nodal metastases. Right posterolateral 9th-11th rib fractures. Severe compression fracture deformity at T7. Aortic Atherosclerosis (ICD10-I70.0) and Emphysema (ICD10-J43.9). Electronically Signed   By: Julian Hy M.D.   On: 09/04/2021 20:27   DG Chest Port 1 View  Result Date: 09/04/2021 CLINICAL DATA:  Shortness of breath. EXAM: PORTABLE CHEST 1 VIEW COMPARISON:  August 23, 2021 FINDINGS: The heart size and mediastinal contours are within normal limits. There is moderate severity calcification of the aortic arch. There is stable opacification of left lung base with stable post treatment scarring seen along the left hilum. A stable moderate size left pleural effusion is also seen. The right lung is clear. No pneumothorax is identified. The visualized skeletal structures are unremarkable. IMPRESSION: 1. Stable post treatment scarring seen along the left hilum. 2. Stable moderate size left pleural effusion and suspected left basilar consolidation. Electronically Signed   By: Virgina Norfolk M.D.   On: 09/04/2021 19:20   CT Chest W Contrast  Result Date:  09/04/2021 CLINICAL DATA:  Small-cell lung cancer restaging, chemotherapy complete * Tracking Code: BO * EXAM: CT CHEST WITH CONTRAST TECHNIQUE: Multidetector CT imaging of the chest was performed during intravenous contrast administration. RADIATION DOSE REDUCTION: This exam was performed according to the departmental dose-optimization program which includes automated exposure control, adjustment of the mA and/or kV according to patient size and/or use of iterative reconstruction technique. CONTRAST:  56mL OMNIPAQUE IOHEXOL 300 MG/ML  SOLN COMPARISON:  Rib radiographs, 08/23/2021, CT chest, 08/26/2021 07/06/2021, 05/29/2021 FINDINGS: Cardiovascular: Aortic atherosclerosis. Normal heart size. Three-vessel coronary artery calcifications. No pericardial effusion. Mediastinum/Nodes: Significant interval enlargement of subcarinal lymph nodes, measuring up to 2.7 x 1.7 cm, previously 2.0 x 1.0 cm on most recent PE examination dated 07/26/2021 (series 2, image 68). Thyroid gland, trachea, and esophagus demonstrate no significant findings. Lungs/Pleura: Moderate to severe centrilobular and paraseptal emphysema. Unchanged, moderate left pleural effusion associated atelectasis or consolidation. New, small right pleural effusion associated atelectasis or consolidation. Unchanged post treatment appearance of the perihilar and infrahilar left lung with fibrosis and volume loss (series 5, image 73). Upper Abdomen: No acute abnormality. Musculoskeletal: No chest wall abnormality. Mildly displaced acute fractures of the posterolateral right ninth and tenth ribs, as seen by prior rib radiographs (series 2, image 122). High-grade sclerotic wedge deformity of T7, with increased height loss compared to prior examination (series 7, image 82). IMPRESSION: 1. Significant interval enlargement of subcarinal lymph nodes, worrisome for recurrent nodal metastatic disease. 2. Unchanged post treatment/post radiation appearance of the perihilar  and infrahilar left lung. 3. Unchanged, moderate left pleural effusion and associated atelectasis or consolidation. New, small right pleural effusion associated atelectasis or consolidation. 4. High-grade sclerotic wedge deformity of T7, with increased height loss compared to prior examination. 5. Mildly displaced acute fractures of the posterolateral right ninth and tenth ribs, as seen by prior dedicated rib radiographs. 6. Emphysema. 7. Coronary artery disease. Aortic Atherosclerosis (ICD10-I70.0) and Emphysema (ICD10-J43.9). Electronically Signed   By: Delanna Ahmadi M.D.   On: 09/04/2021 08:33    ASSESSMENT AND PLAN: This is a very pleasant 71 years old white female recently diagnosed with limited stage (T2  a, N2, M0) small cell lung cancer presented with left lower lobe lung mass in addition to left hilar and subcarinal lymphadenopathy in October 2022.  The patient completed a course of systemic chemotherapy with cisplatin and etoposide status post 4 cycles concurrent with radiation. Starting from cycle #4 her dose of cisplatin will be reduced to 60 Mg/M2 on day 1 and 2 etoposide 80 Mg/M2 on days 1, 2 and 3. The patient has a rough time with this treatment with significant fatigue and weakness as well as pancytopenia. She also underwent prophylactic cranial irradiation under the care of Dr. Lisbeth Renshaw. The patient is currently on observation but she has increasing fatigue and weakness as well as worsening dyspnea. She had repeat CT scan of the chest performed recently that showed significant interval enlargement of a subcarinal lymph node worrisome for recurrent nodal metastasis.  She continues to have unchanged radiation changes as well as any changes moderate left pleural effusion and a new small right pleural effusion. I recommended for the patient to have a PET scan for further evaluation of her disease and to rule out disease recurrence in the subcarinal lymphadenopathy. For the persistent left pleural  effusion and shortness of breath, I will arrange for the patient to have a therapeutic and diagnostic left ultrasound-guided thoracentesis. I will see the patient back for follow-up visit in around 2 weeks for evaluation and discussion of her condition and treatment options based on the PET scan and the cytology of the fluid. The patient was advised to call immediately if she has any other concerning symptoms in the interval.  The patient voices understanding of current disease status and treatment options and is in agreement with the current care plan.  All questions were answered. The patient knows to call the clinic with any problems, questions or concerns. We can certainly see the patient much sooner if necessary. The total time spent in the appointment was 35 minutes.  Disclaimer: This note was dictated with voice recognition software. Similar sounding words can inadvertently be transcribed and may not be corrected upon review.

## 2021-09-05 NOTE — ED Notes (Signed)
Awaiting PTAR.

## 2021-09-06 DIAGNOSIS — I4892 Unspecified atrial flutter: Secondary | ICD-10-CM | POA: Diagnosis not present

## 2021-09-06 DIAGNOSIS — C3432 Malignant neoplasm of lower lobe, left bronchus or lung: Secondary | ICD-10-CM | POA: Diagnosis not present

## 2021-09-06 DIAGNOSIS — D701 Agranulocytosis secondary to cancer chemotherapy: Secondary | ICD-10-CM | POA: Diagnosis not present

## 2021-09-06 DIAGNOSIS — D630 Anemia in neoplastic disease: Secondary | ICD-10-CM | POA: Diagnosis not present

## 2021-09-06 DIAGNOSIS — I480 Paroxysmal atrial fibrillation: Secondary | ICD-10-CM | POA: Diagnosis not present

## 2021-09-06 DIAGNOSIS — I5032 Chronic diastolic (congestive) heart failure: Secondary | ICD-10-CM | POA: Diagnosis not present

## 2021-09-06 DIAGNOSIS — J439 Emphysema, unspecified: Secondary | ICD-10-CM | POA: Diagnosis not present

## 2021-09-06 DIAGNOSIS — J9611 Chronic respiratory failure with hypoxia: Secondary | ICD-10-CM | POA: Diagnosis not present

## 2021-09-06 DIAGNOSIS — I110 Hypertensive heart disease with heart failure: Secondary | ICD-10-CM | POA: Diagnosis not present

## 2021-09-06 NOTE — Pre-Procedure Instructions (Signed)
Surgical Instructions    Your procedure is scheduled on Thursday, September 7th.  Report to [name] "A" at 12:45 P.M., then check in with the Admitting office.  Call this number if you have problems the morning of surgery:  [phone]   If you have any questions prior to your surgery date call [phone]: Open Monday-Friday 8am-4pm    Remember:  Do not eat or drink after midnight the night before your surgery    Take these medicines the morning of surgery with A SIP OF WATER  amiodarone (PACERONE)  escitalopram (LEXAPRO) memantine (NAMENDA)  metoprolol succinate (TOPROL-XL) omeprazole (PRILOSEC) rosuvastatin (CRESTOR)    Take these medications as needed: acetaminophen (TYLENOL)  Abuterol inhaler- Please bring all inhalers with you the day of surgery.  Eye drops HYDROcodone-acetaminophen (NORCO)  hydrOXYzine (ATARAX)  Nebulizers prochlorperazine (COMPAZINE) traMADol (ULTRAM)   Follow your surgeon's instructions on when to stop apixaban (ELIQUIS).  If no instructions were given by your surgeon then you will need to call the office to get those instructions.    As of today, STOP taking any Aspirin (unless otherwise instructed by your surgeon) Aleve, Naproxen, Ibuprofen, Motrin, Advil, Goody's, BC's, all herbal medications, fish oil, and all vitamins.                     Do NOT Smoke (Tobacco/Vaping) for 24 hours prior to your procedure.  If you use a CPAP at night, you may bring your mask/headgear for your overnight stay.   Contacts, glasses, piercing's, hearing aid's, dentures or partials may not be worn into surgery, please bring cases for these belongings.    For patients admitted to the hospital, discharge time will be determined by your treatment team.   Patients discharged the day of surgery will not be allowed to drive home, and someone needs to stay with them for 24 hours.  SURGICAL WAITING ROOM VISITATION Patients having surgery or a  procedure may have no more than 2 support people in the waiting area - these visitors may rotate.   Children under the age of 79 must have an adult with them who is not the patient. If the patient needs to stay at the hospital during part of their recovery, the visitor guidelines for inpatient rooms apply. Pre-op nurse will coordinate an appropriate time for 1 support person to accompany patient in pre-op.  This support person may not rotate.   Please refer to the [facility] website for the visitor guidelines for Inpatients (after your surgery is over and you are in a regular room).    Special instructions:   [facility]- Preparing For Surgery  Before surgery, you can play an important role. Because skin is not sterile, your skin needs to be as free of germs as possible. You can reduce the number of germs on your skin by washing with CHG (chlorahexidine gluconate) Soap before surgery.  CHG is an antiseptic cleaner which kills germs and bonds with the skin to continue killing germs even after washing.    Oral Hygiene is also important to reduce your risk of infection.  Remember - BRUSH YOUR TEETH THE MORNING OF SURGERY WITH YOUR REGULAR TOOTHPASTE  Please do not use if you have an allergy to CHG or antibacterial soaps. If your skin becomes reddened/irritated stop using the CHG.  Do not shave (including legs and underarms) for at least 48 hours prior to first CHG shower. It is OK to shave your face.  Please follow these  instructions carefully.   Shower the NIGHT BEFORE SURGERY and the MORNING OF SURGERY  If you chose to wash your hair, wash your hair first as usual with your normal shampoo.  After you shampoo, rinse your hair and body thoroughly to remove the shampoo.  Use CHG Soap as you would any other liquid soap. You can apply CHG directly to the skin and wash gently with a scrungie or a clean washcloth.   Apply the CHG Soap to your body ONLY FROM THE NECK DOWN.  Do not use on open  wounds or open sores. Avoid contact with your eyes, ears, mouth and genitals (private parts). Wash Face and genitals (private parts)  with your normal soap.   Wash thoroughly, paying special attention to the area where your surgery will be performed.  Thoroughly rinse your body with warm water from the neck down.  DO NOT shower/wash with your normal soap after using and rinsing off the CHG Soap.  Pat yourself dry with a CLEAN TOWEL.  Wear CLEAN PAJAMAS to bed the night before surgery  Place CLEAN SHEETS on your bed the night before your surgery  DO NOT SLEEP WITH PETS.   Day of Surgery: Take a shower with CHG soap. Do not wear jewelry or makeup Do not wear lotions, powders, perfumes, or deodorant. Do not shave 48 hours prior to surgery.  Do not bring valuables to the hospital.  [facility] is not responsible for any belongings or valuables. Do not wear nail polish, gel polish, artificial nails, or any other type of covering on natural nails (fingers and toes) If you have artificial nails or gel coating that need to be removed by a nail salon, please have this removed prior to surgery. Artificial nails or gel coating may interfere with anesthesia's ability to adequately monitor your vital signs. Wear Clean/Comfortable clothing the morning of surgery Remember to brush your teeth WITH YOUR REGULAR TOOTHPASTE.   Please read over the following fact sheets that you were given.    If you received a COVID test during your pre-op visit  it is requested that you wear a mask when out in public, stay away from anyone that may not be feeling well and notify your surgeon if you develop symptoms. If you have been in contact with anyone that has tested positive in the last 10 days please notify you surgeon.

## 2021-09-07 ENCOUNTER — Other Ambulatory Visit: Payer: Self-pay

## 2021-09-07 ENCOUNTER — Encounter (HOSPITAL_COMMUNITY): Payer: Self-pay | Admitting: Physician Assistant

## 2021-09-07 ENCOUNTER — Encounter (HOSPITAL_COMMUNITY): Payer: Self-pay

## 2021-09-07 ENCOUNTER — Encounter (HOSPITAL_COMMUNITY)
Admission: RE | Admit: 2021-09-07 | Discharge: 2021-09-07 | Disposition: A | Discharge status: Home / Self Care | Payer: Medicare HMO | Source: Ambulatory Visit | Attending: Neurosurgery | Admitting: Neurosurgery

## 2021-09-07 VITALS — BP 97/57 | HR 86 | Temp 98.0°F | Resp 18 | Ht 67.0 in | Wt 151.0 lb

## 2021-09-07 DIAGNOSIS — R0689 Other abnormalities of breathing: Secondary | ICD-10-CM | POA: Diagnosis not present

## 2021-09-07 DIAGNOSIS — J80 Acute respiratory distress syndrome: Secondary | ICD-10-CM | POA: Diagnosis not present

## 2021-09-07 DIAGNOSIS — I5032 Chronic diastolic (congestive) heart failure: Secondary | ICD-10-CM | POA: Diagnosis present

## 2021-09-07 DIAGNOSIS — X58XXXA Exposure to other specified factors, initial encounter: Secondary | ICD-10-CM | POA: Diagnosis present

## 2021-09-07 DIAGNOSIS — E785 Hyperlipidemia, unspecified: Secondary | ICD-10-CM | POA: Diagnosis present

## 2021-09-07 DIAGNOSIS — R195 Other fecal abnormalities: Secondary | ICD-10-CM | POA: Diagnosis not present

## 2021-09-07 DIAGNOSIS — C3490 Malignant neoplasm of unspecified part of unspecified bronchus or lung: Secondary | ICD-10-CM | POA: Diagnosis not present

## 2021-09-07 DIAGNOSIS — J90 Pleural effusion, not elsewhere classified: Secondary | ICD-10-CM | POA: Diagnosis not present

## 2021-09-07 DIAGNOSIS — I700 Atherosclerosis of aorta: Secondary | ICD-10-CM | POA: Diagnosis not present

## 2021-09-07 DIAGNOSIS — J918 Pleural effusion in other conditions classified elsewhere: Secondary | ICD-10-CM | POA: Diagnosis present

## 2021-09-07 DIAGNOSIS — J9611 Chronic respiratory failure with hypoxia: Secondary | ICD-10-CM | POA: Diagnosis not present

## 2021-09-07 DIAGNOSIS — E876 Hypokalemia: Secondary | ICD-10-CM | POA: Diagnosis not present

## 2021-09-07 DIAGNOSIS — Z515 Encounter for palliative care: Secondary | ICD-10-CM | POA: Diagnosis not present

## 2021-09-07 DIAGNOSIS — Z01818 Encounter for other preprocedural examination: Secondary | ICD-10-CM

## 2021-09-07 DIAGNOSIS — R091 Pleurisy: Secondary | ICD-10-CM | POA: Diagnosis not present

## 2021-09-07 DIAGNOSIS — I110 Hypertensive heart disease with heart failure: Secondary | ICD-10-CM | POA: Diagnosis present

## 2021-09-07 DIAGNOSIS — R079 Chest pain, unspecified: Secondary | ICD-10-CM | POA: Diagnosis not present

## 2021-09-07 DIAGNOSIS — Z923 Personal history of irradiation: Secondary | ICD-10-CM | POA: Diagnosis not present

## 2021-09-07 DIAGNOSIS — K567 Ileus, unspecified: Secondary | ICD-10-CM | POA: Diagnosis present

## 2021-09-07 DIAGNOSIS — R64 Cachexia: Secondary | ICD-10-CM | POA: Diagnosis present

## 2021-09-07 DIAGNOSIS — Z87891 Personal history of nicotine dependence: Secondary | ICD-10-CM | POA: Diagnosis not present

## 2021-09-07 DIAGNOSIS — J9621 Acute and chronic respiratory failure with hypoxia: Secondary | ICD-10-CM | POA: Diagnosis present

## 2021-09-07 DIAGNOSIS — Z4682 Encounter for fitting and adjustment of non-vascular catheter: Secondary | ICD-10-CM | POA: Diagnosis not present

## 2021-09-07 DIAGNOSIS — R7881 Bacteremia: Secondary | ICD-10-CM | POA: Diagnosis present

## 2021-09-07 DIAGNOSIS — R918 Other nonspecific abnormal finding of lung field: Secondary | ICD-10-CM | POA: Diagnosis not present

## 2021-09-07 DIAGNOSIS — J439 Emphysema, unspecified: Secondary | ICD-10-CM | POA: Diagnosis not present

## 2021-09-07 DIAGNOSIS — J9811 Atelectasis: Secondary | ICD-10-CM | POA: Diagnosis not present

## 2021-09-07 DIAGNOSIS — R0602 Shortness of breath: Secondary | ICD-10-CM | POA: Diagnosis not present

## 2021-09-07 DIAGNOSIS — Z9221 Personal history of antineoplastic chemotherapy: Secondary | ICD-10-CM | POA: Diagnosis not present

## 2021-09-07 DIAGNOSIS — R531 Weakness: Secondary | ICD-10-CM | POA: Diagnosis not present

## 2021-09-07 DIAGNOSIS — R4182 Altered mental status, unspecified: Secondary | ICD-10-CM | POA: Diagnosis not present

## 2021-09-07 DIAGNOSIS — Z9981 Dependence on supplemental oxygen: Secondary | ICD-10-CM | POA: Diagnosis not present

## 2021-09-07 DIAGNOSIS — Z85118 Personal history of other malignant neoplasm of bronchus and lung: Secondary | ICD-10-CM | POA: Diagnosis not present

## 2021-09-07 DIAGNOSIS — I480 Paroxysmal atrial fibrillation: Secondary | ICD-10-CM | POA: Diagnosis present

## 2021-09-07 DIAGNOSIS — S22069A Unspecified fracture of T7-T8 vertebra, initial encounter for closed fracture: Secondary | ICD-10-CM | POA: Diagnosis present

## 2021-09-07 DIAGNOSIS — J948 Other specified pleural conditions: Secondary | ICD-10-CM | POA: Diagnosis not present

## 2021-09-07 DIAGNOSIS — Y92239 Unspecified place in hospital as the place of occurrence of the external cause: Secondary | ICD-10-CM | POA: Diagnosis not present

## 2021-09-07 DIAGNOSIS — E222 Syndrome of inappropriate secretion of antidiuretic hormone: Secondary | ICD-10-CM | POA: Diagnosis present

## 2021-09-07 DIAGNOSIS — K56609 Unspecified intestinal obstruction, unspecified as to partial versus complete obstruction: Secondary | ICD-10-CM | POA: Diagnosis present

## 2021-09-07 DIAGNOSIS — C3432 Malignant neoplasm of lower lobe, left bronchus or lung: Secondary | ICD-10-CM | POA: Diagnosis not present

## 2021-09-07 DIAGNOSIS — S22060A Wedge compression fracture of T7-T8 vertebra, initial encounter for closed fracture: Secondary | ICD-10-CM | POA: Diagnosis not present

## 2021-09-07 DIAGNOSIS — Z66 Do not resuscitate: Secondary | ICD-10-CM | POA: Diagnosis present

## 2021-09-07 DIAGNOSIS — Z7189 Other specified counseling: Secondary | ICD-10-CM | POA: Diagnosis not present

## 2021-09-07 DIAGNOSIS — C799 Secondary malignant neoplasm of unspecified site: Secondary | ICD-10-CM | POA: Diagnosis present

## 2021-09-07 DIAGNOSIS — J9809 Other diseases of bronchus, not elsewhere classified: Secondary | ICD-10-CM | POA: Diagnosis not present

## 2021-09-07 DIAGNOSIS — Z85110 Personal history of malignant carcinoid tumor of bronchus and lung: Secondary | ICD-10-CM | POA: Diagnosis not present

## 2021-09-07 DIAGNOSIS — Z01812 Encounter for preprocedural laboratory examination: Secondary | ICD-10-CM | POA: Insufficient documentation

## 2021-09-07 DIAGNOSIS — J189 Pneumonia, unspecified organism: Secondary | ICD-10-CM | POA: Diagnosis present

## 2021-09-07 DIAGNOSIS — K6389 Other specified diseases of intestine: Secondary | ICD-10-CM | POA: Diagnosis not present

## 2021-09-07 DIAGNOSIS — I10 Essential (primary) hypertension: Secondary | ICD-10-CM | POA: Diagnosis not present

## 2021-09-07 DIAGNOSIS — R0902 Hypoxemia: Secondary | ICD-10-CM | POA: Diagnosis not present

## 2021-09-07 DIAGNOSIS — R140 Abdominal distension (gaseous): Secondary | ICD-10-CM | POA: Diagnosis not present

## 2021-09-07 DIAGNOSIS — R471 Dysarthria and anarthria: Secondary | ICD-10-CM | POA: Diagnosis present

## 2021-09-07 DIAGNOSIS — F22 Delusional disorders: Secondary | ICD-10-CM | POA: Diagnosis not present

## 2021-09-07 LAB — SURGICAL PCR SCREEN
MRSA, PCR: NEGATIVE
Staphylococcus aureus: NEGATIVE

## 2021-09-07 NOTE — Progress Notes (Signed)
Anesthesia Chart Review:  71 year old female for T7 kyphoplasty 09/14/2021 with Dr. 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  She completed systemic chemotherapy as well as prophylactic cranial irradiation. Recent increase in supplemental O2 requirement from 2L to 3L to maintain sats. Feels more SOB. CTA 8/28 showed moderate L pleural efffusion. Seen by Dr. Julien Nordmann 8/29 and thoracentesis ordered for 9/1.  Follows with cardiology for history of chronic CHF with mildly reduced EF of 45-50%, persistent atrial fibrillation/flutter on Eliquis, SVT, hypertension, hyperlipidemia, prior TIA. She is s/p multiple DCCV, most recently 03/22/21 with restoration of SR. Last seen 06/29/21 and stable from cardiac standpoint, 6 month followup recommended.   Cleared by PCP to hold Eliquis 5d.  Pt presented to ED on 09/09/21 with altered mental status and hypoxia.  She was found to have recurrent left pleural effusion and was admitted for management.  Ability to proceed with kyphoplasty pending inpatient work-up.  TTE 02/13/2021:  1. Left ventricular ejection fraction, by estimation, is 45 to 50%. The  left ventricle has mildly decreased function. The left ventricle  demonstrates global hypokinesis.   2. The right ventricular size is mildly enlarged. There is mildly  elevated pulmonary artery systolic pressure. The estimated right  ventricular systolic pressure is 78.2 mmHg.   3. Right atrial size was mildly dilated.   4. Moderate pleural effusion in the left lateral region.   5. Tricuspid valve regurgitation is moderate.   6. The inferior vena cava is dilated in size with <50% respiratory  variability, suggesting right atrial pressure of 15 mmHg.   Comparison(s): Prior images reviewed side by side. The left ventricular  function is worsened.    Wynonia Musty [facility] Short Stay Center/Anesthesiology Phone [phone] 09/12/2021 9:36 AM

## 2021-09-07 NOTE — Progress Notes (Signed)
PCP - Dr. [name] Oncologist- Dr. Curt Bears  Cardiologist - Dr. Reggy Eye  PPM/ICD - n/a  Chest x-ray - 08/30/21 EKG - 09/04/21 Stress Test - denies ECHO - 02/13/21 Cardiac Cath -denies   Sleep Study - denies CPAP - denies  Blood Thinner Instructions:Eliquis: Hold 5 days pre-op. LD will be 09/08/21 Aspirin Instructions: n/a  NPO at MD  COVID TEST- n/a  Anesthesia review: Yes, Karoline Caldwell, PA-C to f/u with pt after thoracentesis to see an improvement of respiratory status.    Patient denies shortness of breath, fever, cough and chest pain at PAT appointment   All instructions explained to the patient, with a verbal understanding of the material. Patient agrees to go over the instructions while at home for a better understanding. Patient also instructed to self quarantine after being tested for COVID-19. The opportunity to ask questions was provided.

## 2021-09-08 ENCOUNTER — Inpatient Hospital Stay (HOSPITAL_COMMUNITY)
Admission: RE | Admit: 2021-09-08 | Discharge: 2021-09-08 | Disposition: A | Discharge status: Home / Self Care | Payer: Medicare HMO | Source: Ambulatory Visit | Attending: Internal Medicine | Admitting: Internal Medicine

## 2021-09-08 ENCOUNTER — Telehealth: Payer: Self-pay | Admitting: Physician Assistant

## 2021-09-08 ENCOUNTER — Ambulatory Visit (HOSPITAL_COMMUNITY)
Admission: RE | Admit: 2021-09-08 | Discharge: 2021-09-08 | Disposition: A | Discharge status: Home / Self Care | Payer: Medicare HMO | Source: Ambulatory Visit | Attending: Radiology | Admitting: Radiology

## 2021-09-08 DIAGNOSIS — J90 Pleural effusion, not elsewhere classified: Secondary | ICD-10-CM | POA: Insufficient documentation

## 2021-09-08 DIAGNOSIS — C3492 Malignant neoplasm of unspecified part of left bronchus or lung: Secondary | ICD-10-CM | POA: Insufficient documentation

## 2021-09-08 DIAGNOSIS — C3490 Malignant neoplasm of unspecified part of unspecified bronchus or lung: Secondary | ICD-10-CM | POA: Diagnosis not present

## 2021-09-08 DIAGNOSIS — J948 Other specified pleural conditions: Secondary | ICD-10-CM | POA: Diagnosis not present

## 2021-09-08 MED ORDER — LIDOCAINE HCL 1 % IJ SOLN
INTRAMUSCULAR | Status: AC
  Administered 2021-09-08: 10 mL
  Filled 2021-09-08: qty 20

## 2021-09-08 NOTE — Procedures (Signed)
Ultrasound-guided diagnostic and therapeutic left thoracentesis performed yielding 750 cc of yellow fluid. No immediate complications. Follow-up chest x-ray pending. The fluid was sent to the lab for preordered studies. EBL < 2 cc. Due to pt coughing only the above amount of fluid was removed .

## 2021-09-08 NOTE — Telephone Encounter (Signed)
Scheduled per 08/28 los, called and spoke with patient's relative. Patient will be notified of upcoming appointment per my chart.

## 2021-09-09 ENCOUNTER — Encounter (HOSPITAL_COMMUNITY): Payer: Self-pay

## 2021-09-09 ENCOUNTER — Inpatient Hospital Stay (HOSPITAL_COMMUNITY): Payer: Medicare HMO

## 2021-09-09 ENCOUNTER — Inpatient Hospital Stay (HOSPITAL_COMMUNITY)
Admission: EM | Admit: 2021-09-09 | Discharge: 2021-09-23 | DRG: 186 | Disposition: A | Discharge status: Hospice | Payer: Medicare HMO | Attending: Family Medicine | Admitting: Family Medicine

## 2021-09-09 ENCOUNTER — Emergency Department (HOSPITAL_COMMUNITY): Payer: Medicare HMO

## 2021-09-09 ENCOUNTER — Other Ambulatory Visit: Payer: Self-pay

## 2021-09-09 DIAGNOSIS — R0602 Shortness of breath: Principal | ICD-10-CM

## 2021-09-09 DIAGNOSIS — C3432 Malignant neoplasm of lower lobe, left bronchus or lung: Secondary | ICD-10-CM | POA: Diagnosis present

## 2021-09-09 DIAGNOSIS — Z515 Encounter for palliative care: Secondary | ICD-10-CM

## 2021-09-09 DIAGNOSIS — E222 Syndrome of inappropriate secretion of antidiuretic hormone: Secondary | ICD-10-CM | POA: Diagnosis present

## 2021-09-09 DIAGNOSIS — J9621 Acute and chronic respiratory failure with hypoxia: Secondary | ICD-10-CM | POA: Diagnosis present

## 2021-09-09 DIAGNOSIS — Z85118 Personal history of other malignant neoplasm of bronchus and lung: Secondary | ICD-10-CM | POA: Diagnosis not present

## 2021-09-09 DIAGNOSIS — E785 Hyperlipidemia, unspecified: Secondary | ICD-10-CM | POA: Diagnosis present

## 2021-09-09 DIAGNOSIS — Z85110 Personal history of malignant carcinoid tumor of bronchus and lung: Secondary | ICD-10-CM | POA: Diagnosis not present

## 2021-09-09 DIAGNOSIS — Z7189 Other specified counseling: Secondary | ICD-10-CM | POA: Diagnosis not present

## 2021-09-09 DIAGNOSIS — R531 Weakness: Secondary | ICD-10-CM | POA: Diagnosis not present

## 2021-09-09 DIAGNOSIS — J189 Pneumonia, unspecified organism: Secondary | ICD-10-CM | POA: Diagnosis present

## 2021-09-09 DIAGNOSIS — Z803 Family history of malignant neoplasm of breast: Secondary | ICD-10-CM

## 2021-09-09 DIAGNOSIS — K56609 Unspecified intestinal obstruction, unspecified as to partial versus complete obstruction: Secondary | ICD-10-CM | POA: Diagnosis present

## 2021-09-09 DIAGNOSIS — I10 Essential (primary) hypertension: Secondary | ICD-10-CM | POA: Diagnosis not present

## 2021-09-09 DIAGNOSIS — J90 Pleural effusion, not elsewhere classified: Principal | ICD-10-CM | POA: Diagnosis present

## 2021-09-09 DIAGNOSIS — Z9981 Dependence on supplemental oxygen: Secondary | ICD-10-CM

## 2021-09-09 DIAGNOSIS — Z9221 Personal history of antineoplastic chemotherapy: Secondary | ICD-10-CM

## 2021-09-09 DIAGNOSIS — Z66 Do not resuscitate: Secondary | ICD-10-CM

## 2021-09-09 DIAGNOSIS — R471 Dysarthria and anarthria: Secondary | ICD-10-CM | POA: Diagnosis present

## 2021-09-09 DIAGNOSIS — I480 Paroxysmal atrial fibrillation: Secondary | ICD-10-CM | POA: Diagnosis present

## 2021-09-09 DIAGNOSIS — I700 Atherosclerosis of aorta: Secondary | ICD-10-CM | POA: Diagnosis not present

## 2021-09-09 DIAGNOSIS — R918 Other nonspecific abnormal finding of lung field: Secondary | ICD-10-CM | POA: Diagnosis not present

## 2021-09-09 DIAGNOSIS — J439 Emphysema, unspecified: Secondary | ICD-10-CM | POA: Diagnosis not present

## 2021-09-09 DIAGNOSIS — Z8673 Personal history of transient ischemic attack (TIA), and cerebral infarction without residual deficits: Secondary | ICD-10-CM

## 2021-09-09 DIAGNOSIS — R64 Cachexia: Secondary | ICD-10-CM | POA: Diagnosis present

## 2021-09-09 DIAGNOSIS — C799 Secondary malignant neoplasm of unspecified site: Secondary | ICD-10-CM | POA: Diagnosis present

## 2021-09-09 DIAGNOSIS — Y92239 Unspecified place in hospital as the place of occurrence of the external cause: Secondary | ICD-10-CM | POA: Diagnosis not present

## 2021-09-09 DIAGNOSIS — Z87891 Personal history of nicotine dependence: Secondary | ICD-10-CM

## 2021-09-09 DIAGNOSIS — I110 Hypertensive heart disease with heart failure: Secondary | ICD-10-CM | POA: Diagnosis present

## 2021-09-09 DIAGNOSIS — Z888 Allergy status to other drugs, medicaments and biological substances status: Secondary | ICD-10-CM

## 2021-09-09 DIAGNOSIS — S22069A Unspecified fracture of T7-T8 vertebra, initial encounter for closed fracture: Secondary | ICD-10-CM | POA: Diagnosis present

## 2021-09-09 DIAGNOSIS — Z4682 Encounter for fitting and adjustment of non-vascular catheter: Secondary | ICD-10-CM | POA: Diagnosis not present

## 2021-09-09 DIAGNOSIS — R4182 Altered mental status, unspecified: Secondary | ICD-10-CM | POA: Diagnosis not present

## 2021-09-09 DIAGNOSIS — K219 Gastro-esophageal reflux disease without esophagitis: Secondary | ICD-10-CM | POA: Diagnosis present

## 2021-09-09 DIAGNOSIS — E876 Hypokalemia: Secondary | ICD-10-CM | POA: Diagnosis not present

## 2021-09-09 DIAGNOSIS — J9611 Chronic respiratory failure with hypoxia: Secondary | ICD-10-CM | POA: Diagnosis not present

## 2021-09-09 DIAGNOSIS — F22 Delusional disorders: Secondary | ICD-10-CM | POA: Diagnosis not present

## 2021-09-09 DIAGNOSIS — J80 Acute respiratory distress syndrome: Secondary | ICD-10-CM | POA: Diagnosis not present

## 2021-09-09 DIAGNOSIS — J9811 Atelectasis: Secondary | ICD-10-CM | POA: Diagnosis not present

## 2021-09-09 DIAGNOSIS — R0902 Hypoxemia: Secondary | ICD-10-CM | POA: Diagnosis not present

## 2021-09-09 DIAGNOSIS — R7881 Bacteremia: Secondary | ICD-10-CM | POA: Diagnosis present

## 2021-09-09 DIAGNOSIS — Z881 Allergy status to other antibiotic agents status: Secondary | ICD-10-CM

## 2021-09-09 DIAGNOSIS — Z923 Personal history of irradiation: Secondary | ICD-10-CM | POA: Diagnosis not present

## 2021-09-09 DIAGNOSIS — Z79899 Other long term (current) drug therapy: Secondary | ICD-10-CM

## 2021-09-09 DIAGNOSIS — J918 Pleural effusion in other conditions classified elsewhere: Secondary | ICD-10-CM | POA: Diagnosis present

## 2021-09-09 DIAGNOSIS — R0689 Other abnormalities of breathing: Secondary | ICD-10-CM | POA: Diagnosis not present

## 2021-09-09 DIAGNOSIS — J9809 Other diseases of bronchus, not elsewhere classified: Secondary | ICD-10-CM | POA: Diagnosis not present

## 2021-09-09 DIAGNOSIS — R410 Disorientation, unspecified: Secondary | ICD-10-CM | POA: Diagnosis not present

## 2021-09-09 DIAGNOSIS — T424X5A Adverse effect of benzodiazepines, initial encounter: Secondary | ICD-10-CM | POA: Diagnosis not present

## 2021-09-09 DIAGNOSIS — I5032 Chronic diastolic (congestive) heart failure: Secondary | ICD-10-CM | POA: Diagnosis present

## 2021-09-09 DIAGNOSIS — Z6824 Body mass index (BMI) 24.0-24.9, adult: Secondary | ICD-10-CM

## 2021-09-09 DIAGNOSIS — K6389 Other specified diseases of intestine: Secondary | ICD-10-CM | POA: Diagnosis not present

## 2021-09-09 DIAGNOSIS — G4700 Insomnia, unspecified: Secondary | ICD-10-CM | POA: Diagnosis present

## 2021-09-09 DIAGNOSIS — K567 Ileus, unspecified: Secondary | ICD-10-CM | POA: Diagnosis present

## 2021-09-09 DIAGNOSIS — F419 Anxiety disorder, unspecified: Secondary | ICD-10-CM | POA: Diagnosis present

## 2021-09-09 DIAGNOSIS — X58XXXA Exposure to other specified factors, initial encounter: Secondary | ICD-10-CM | POA: Diagnosis present

## 2021-09-09 DIAGNOSIS — S22060A Wedge compression fracture of T7-T8 vertebra, initial encounter for closed fracture: Secondary | ICD-10-CM | POA: Diagnosis not present

## 2021-09-09 DIAGNOSIS — Z885 Allergy status to narcotic agent status: Secondary | ICD-10-CM

## 2021-09-09 LAB — COMPREHENSIVE METABOLIC PANEL
ALT: 44 U/L (ref 0–44)
AST: 33 U/L (ref 15–41)
Albumin: 3.4 g/dL — ABNORMAL LOW (ref 3.5–5.0)
Alkaline Phosphatase: 96 U/L (ref 38–126)
Anion gap: 12 (ref 5–15)
BUN: 18 mg/dL (ref 8–23)
CO2: 26 mmol/L (ref 22–32)
Calcium: 9.1 mg/dL (ref 8.9–10.3)
Chloride: 94 mmol/L — ABNORMAL LOW (ref 98–111)
Creatinine, Ser: 0.96 mg/dL (ref 0.44–1.00)
GFR, Estimated: >60 mL/min (ref >60)
Glucose, Bld: 145 mg/dL — ABNORMAL HIGH (ref 70–99)
Potassium: 3.9 mmol/L (ref 3.5–5.1)
Sodium: 132 mmol/L — ABNORMAL LOW (ref 135–145)
Total Bilirubin: 0.5 mg/dL (ref 0.3–1.2)
Total Protein: 6.8 g/dL (ref 6.5–8.1)

## 2021-09-09 LAB — I-STAT VENOUS BLOOD GAS, ED
Acid-Base Excess: 4 mmol/L — ABNORMAL HIGH (ref 0.0–2.0)
Bicarbonate: 30 mmol/L — ABNORMAL HIGH (ref 20.0–28.0)
Calcium, Ion: 1.12 mmol/L — ABNORMAL LOW (ref 1.15–1.40)
HCT: 41 % (ref 36.0–46.0)
Hemoglobin: 13.9 g/dL (ref 12.0–15.0)
O2 Saturation: 61 %
Potassium: 4 mmol/L (ref 3.5–5.1)
Sodium: 131 mmol/L — ABNORMAL LOW (ref 135–145)
TCO2: 32 mmol/L (ref 22–32)
pCO2, Ven: 50.3 mmHg (ref 44–60)
pH, Ven: 7.383 (ref 7.25–7.43)
pO2, Ven: 33 mmHg (ref 32–45)

## 2021-09-09 LAB — CBC WITH DIFFERENTIAL/PLATELET
Abs Immature Granulocytes: 0.05 10*3/uL (ref 0.00–0.07)
Basophils Absolute: 0 10*3/uL (ref 0.0–0.1)
Basophils Relative: 0 %
Eosinophils Absolute: 0 10*3/uL (ref 0.0–0.5)
Eosinophils Relative: 0 %
HCT: 38.5 % (ref 36.0–46.0)
Hemoglobin: 13.6 g/dL (ref 12.0–15.0)
Immature Granulocytes: 0 %
Lymphocytes Relative: 1 %
Lymphs Abs: 0.2 10*3/uL — ABNORMAL LOW (ref 0.7–4.0)
MCH: 37.2 pg — ABNORMAL HIGH (ref 26.0–34.0)
MCHC: 35.3 g/dL (ref 30.0–36.0)
MCV: 105.2 fL — ABNORMAL HIGH (ref 80.0–100.0)
Monocytes Absolute: 0.5 10*3/uL (ref 0.1–1.0)
Monocytes Relative: 3 %
Neutro Abs: 16.2 10*3/uL — ABNORMAL HIGH (ref 1.7–7.7)
Neutrophils Relative %: 96 %
Platelets: 178 10*3/uL (ref 150–400)
RBC: 3.66 MIL/uL — ABNORMAL LOW (ref 3.87–5.11)
RDW: 14.1 % (ref 11.5–15.5)
WBC: 16.9 10*3/uL — ABNORMAL HIGH (ref 4.0–10.5)
nRBC: 0 % (ref 0.0–0.2)

## 2021-09-09 LAB — AMMONIA: Ammonia: 19 umol/L (ref 9–35)

## 2021-09-09 LAB — CBG MONITORING, ED
Glucose-Capillary: 142 mg/dL — ABNORMAL HIGH (ref 70–99)
Glucose-Capillary: 106 mg/dL — ABNORMAL HIGH (ref 70–99)

## 2021-09-09 LAB — MAGNESIUM: Magnesium: 1.7 mg/dL (ref 1.7–2.4)

## 2021-09-09 MED ORDER — BISACODYL 5 MG PO TBEC
10.0000 mg | DELAYED_RELEASE_TABLET | Freq: Once | ORAL | Status: AC
  Administered 2021-09-09: 10 mg via ORAL
  Filled 2021-09-09: qty 2

## 2021-09-09 MED ORDER — ONDANSETRON HCL 4 MG PO TABS
4.0000 mg | ORAL_TABLET | Freq: Four times a day (QID) | ORAL | Status: DC | PRN
  Administered 2021-09-21: 4 mg via ORAL
  Filled 2021-09-09: qty 1

## 2021-09-09 MED ORDER — ACETAMINOPHEN 325 MG PO TABS
650.0000 mg | ORAL_TABLET | Freq: Four times a day (QID) | ORAL | Status: DC | PRN
  Administered 2021-09-09 – 2021-09-12 (×2): 650 mg via ORAL
  Filled 2021-09-09 (×2): qty 2

## 2021-09-09 MED ORDER — ALBUTEROL SULFATE (2.5 MG/3ML) 0.083% IN NEBU: 2.5000 mg | INHALATION_SOLUTION | RESPIRATORY_TRACT | Status: DC | PRN

## 2021-09-09 MED ORDER — ONDANSETRON HCL 4 MG/2ML IJ SOLN
4.0000 mg | Freq: Four times a day (QID) | INTRAMUSCULAR | Status: DC | PRN
  Administered 2021-09-11 – 2021-09-23 (×6): 4 mg via INTRAVENOUS
  Filled 2021-09-09 (×6): qty 2

## 2021-09-09 MED ORDER — HYDROCODONE-ACETAMINOPHEN 5-325 MG PO TABS
1.0000 | ORAL_TABLET | ORAL | Status: DC | PRN
  Administered 2021-09-10 – 2021-09-13 (×8): 2 via ORAL
  Administered 2021-09-14: 1 via ORAL
  Administered 2021-09-14: 2 via ORAL
  Administered 2021-09-14: 1 via ORAL
  Administered 2021-09-15: 2 via ORAL
  Administered 2021-09-15 (×2): 1 via ORAL
  Administered 2021-09-16 (×2): 2 via ORAL
  Administered 2021-09-16: 1 via ORAL
  Administered 2021-09-17 – 2021-09-21 (×17): 2 via ORAL
  Administered 2021-09-22: 1 via ORAL
  Administered 2021-09-22 – 2021-09-23 (×4): 2 via ORAL
  Filled 2021-09-09: qty 2
  Filled 2021-09-09 (×3): qty 1
  Filled 2021-09-09 (×14): qty 2
  Filled 2021-09-09: qty 1
  Filled 2021-09-09 (×2): qty 2
  Filled 2021-09-09: qty 1
  Filled 2021-09-09 (×6): qty 2
  Filled 2021-09-09: qty 1
  Filled 2021-09-09 (×9): qty 2
  Filled 2021-09-09: qty 1
  Filled 2021-09-09: qty 2
  Filled 2021-09-09: qty 1

## 2021-09-09 MED ORDER — METOPROLOL SUCCINATE ER 25 MG PO TB24
25.0000 mg | ORAL_TABLET | Freq: Every day | ORAL | Status: DC
  Administered 2021-09-10 – 2021-09-18 (×9): 25 mg via ORAL
  Filled 2021-09-09 (×10): qty 1

## 2021-09-09 MED ORDER — HEPARIN SODIUM (PORCINE) 5000 UNIT/ML IJ SOLN
5000.0000 [IU] | Freq: Three times a day (TID) | INTRAMUSCULAR | Status: DC
  Administered 2021-09-09 – 2021-09-17 (×22): 5000 [IU] via SUBCUTANEOUS
  Filled 2021-09-09 (×22): qty 1

## 2021-09-09 MED ORDER — POLYETHYLENE GLYCOL 3350 17 G PO PACK
34.0000 g | PACK | Freq: Once | ORAL | Status: AC
Start: 2021-09-09 — End: 2021-09-09
  Administered 2021-09-09: 34 g via ORAL
  Filled 2021-09-09: qty 2

## 2021-09-09 MED ORDER — SODIUM CHLORIDE 0.9 % IV SOLN
500.0000 mg | Freq: Once | INTRAVENOUS | Status: AC
  Administered 2021-09-09: 500 mg via INTRAVENOUS
  Filled 2021-09-09: qty 5

## 2021-09-09 MED ORDER — ACETAMINOPHEN 650 MG RE SUPP: 650.0000 mg | Freq: Four times a day (QID) | RECTAL | Status: DC | PRN

## 2021-09-09 MED ORDER — AMIODARONE HCL 200 MG PO TABS
200.0000 mg | ORAL_TABLET | Freq: Every day | ORAL | Status: DC
  Administered 2021-09-09 – 2021-09-23 (×15): 200 mg via ORAL
  Filled 2021-09-09 (×15): qty 1

## 2021-09-09 MED ORDER — SODIUM CHLORIDE 0.9 % IV SOLN
1.0000 g | Freq: Once | INTRAVENOUS | Status: AC
  Administered 2021-09-09: 1 g via INTRAVENOUS
  Filled 2021-09-09: qty 10

## 2021-09-09 MED ORDER — ROSUVASTATIN CALCIUM 5 MG PO TABS
5.0000 mg | ORAL_TABLET | Freq: Every day | ORAL | Status: DC
  Administered 2021-09-09 – 2021-09-22 (×14): 5 mg via ORAL
  Filled 2021-09-09 (×14): qty 1

## 2021-09-09 NOTE — Assessment & Plan Note (Signed)
Continue supplemental O2. Normally wearing 2-3 L/min at home.

## 2021-09-09 NOTE — Assessment & Plan Note (Signed)
Initial head CT is negative. Pt stopped her Eliquis 48 hours ago in preparation for kyphoplasty for her vertebral fracture. Pt without any focal deficits now except for an exceedingly nasal-quality voice...something her family states is not normal for pt's speech pattern.  May need MRI brain to further evaluate. Pt outside window for any intervention as her LKN was evening of 09-08-2021.

## 2021-09-09 NOTE — Assessment & Plan Note (Signed)
Stable. Off eliquis for 48 hous due to upcoming kyphoplasty. In NSR.

## 2021-09-09 NOTE — Assessment & Plan Note (Addendum)
Admit to inpatient med/surg bed. Pt is s/p left thoracentesis yesterday. cxr shows reaccumulation of her pleural effusion. Doubt hemothorax as pt's HgB is actually higher today than 5 days ago. Cytology pending on pleural fluid from yesterday. Will obtain CT chest to evaluate.  Her productive cough could be from pneumonia or from retained secretions trapped in her lung due to her pleural effusion. And now that part of her pleural effusion has been removed, she is now starting to mobilizes these secretions. EDP has ordered dose of IV rocephin/zithromax. Will hold off on further abx treatment until recurrent pleural effusion +- pneumonia has been evaluated with CT.

## 2021-09-09 NOTE — ED Notes (Signed)
The family for this patient came out to the nurses station stating that the patient was having a productive cough but was not able to completely expel the mucus, and the patient began to gag. This EMT suctioned the patient's airway. The patient seemed a little bit more comfortable after suctioning. This EMT informed the RN and the Provider.

## 2021-09-09 NOTE — ED Notes (Signed)
This EMT performed a bladder scan on this patient. The bladder scan resulted with 0 mL

## 2021-09-09 NOTE — Assessment & Plan Note (Signed)
Verified with pt. Witnessed by her husband and both her dtrs

## 2021-09-09 NOTE — ED Triage Notes (Signed)
Pt arrived via [facility] from home for AMS, lethargic, respiratory distress. Per EMS, pt was 83% on 3L 02 per [place]. Pt had pleurocentesis yesterday. Pt respiratory sounds rattling. MD at bedside. Pt wears 3L 02 at baseline.

## 2021-09-09 NOTE — ED Notes (Signed)
I notified Dr Alvino Chapel of pt's elevated bp

## 2021-09-09 NOTE — ED Provider Notes (Signed)
[facility] EMERGENCY DEPARTMENT Provider Note   CSN: [id number] Arrival date & time: 09/09/21  1508     History  Chief Complaint  Patient presents with   AMS/lethargic    Patricia [name] is a 70 y.o. female. Senting due to altered mental status and increased work of breathing.  Patient is altered on arrival and unable to provide further history.  Per family at bedside, patient was altered upon awakening this morning and appeared to be very weak.  No known fevers.  Patient has had increased work of breathing over the past 3 days.  Yesterday she had left-sided thoracentesis.  At baseline, patient is weak, but she is able to carry a conversation, but today she has not been able to.  She has been on pain medications after a recent fall during which she sustained right-sided rib fractures and a spinal compression fracture. Also reports constipation with no bowel movement in 3 days.  HPI     Home Medications Prior to Admission medications   Medication Sig Start Date End Date Taking? Authorizing Provider  acetaminophen (TYLENOL) 325 MG tablet Take 650 mg by mouth every 6 (six) hours as needed for headache or moderate pain.   Yes [provider]  albuterol (VENTOLIN HFA) 108 (90 Base) MCG/ACT inhaler Inhale 2 puffs into the lungs every 6 (six) hours as needed for shortness of breath. 03/04/21  Yes [provider]  amiodarone (PACERONE) 200 MG tablet Take 1 tablet (200 mg total) by mouth daily. 04/24/21  Yes Camnitz, Will Hassell Done, MD  apixaban (ELIQUIS) 5 MG TABS tablet TAKE 1 TABLET(5 MG) BY MOUTH TWICE DAILY 06/12/21  Yes Crenshaw, Denice Bors, MD  Black Cohosh (REMIFEMIN PO) Take 1 tablet by mouth 2 (two) times daily.   Yes [provider]  carboxymethylcellulose (REFRESH PLUS) 0.5 % SOLN Place 1 drop into both eyes 3 (three) times daily as needed (for dryness).    Yes [provider]  carisoprodol (SOMA) 350 MG tablet Take 350 mg by mouth 3  (three) times daily as needed for muscle spasms.   Yes [provider]  Cholecalciferol (VITAMIN D3) 25 MCG (1000 UT) CHEW Chew 1 tablet by mouth daily.   Yes [provider]  clobetasol ointment (TEMOVATE) 8.85 % Apply 1 application. topically daily as needed (vaginal irritation).   Yes [provider]  clonazePAM (KLONOPIN) 1 MG tablet Take 1 mg by mouth at bedtime.  10/15/17  Yes [provider]  Coenzyme Q10 200 MG capsule Take 400 mg by mouth daily.   Yes [provider]  Cyanocobalamin (B-12 PO) Take 2 capsules by mouth daily.   Yes [provider]  docusate sodium (COLACE) 100 MG capsule Take 1 capsule (100 mg total) by mouth 2 (two) times daily. Patient taking differently: Take 300 mg by mouth daily. 01/03/21  Yes Pokhrel, Laxman, MD  escitalopram (LEXAPRO) 20 MG tablet Take 20 mg by mouth daily. 08/15/21  Yes [provider]  fluticasone (FLONASE) 50 MCG/ACT nasal spray Place 2 sprays into both nostrils daily as needed for allergies. 10/11/17  Yes [provider]  furosemide (LASIX) 20 MG tablet Take 20 mg by mouth daily as needed for fluid.   Yes [provider]  gabapentin (NEURONTIN) 600 MG tablet Take 600 mg by mouth 2 (two) times daily.   Yes [provider]  guaiFENesin (MUCINEX) 600 MG 12 hr tablet Take 600 mg by mouth daily.   Yes [provider]  HYDROcodone-acetaminophen (NORCO) 10-325 MG tablet Take 1 tablet by mouth every 6 (six) hours as needed for pain. 08/24/21  Yes [provider]  hydrOXYzine (ATARAX) 25 MG tablet Take 25 mg by mouth at bedtime as needed for sleep or anxiety. 03/05/21  Yes [provider]  ipratropium (ATROVENT) 0.02 % nebulizer solution Take 0.5 mg by nebulization 3 (three) times daily as needed for shortness of breath or wheezing. 01/05/21  Yes [provider]  levalbuterol (XOPENEX) 1.25 MG/3ML nebulizer solution Inhale 1.25 mg into the  lungs every 8 (eight) hours as needed for shortness of breath or wheezing. 01/10/21  Yes [provider]  memantine (NAMENDA) 10 MG tablet Take 1 tablet (10 mg total) by mouth 2 (two) times daily. 02/13/21  Yes Hayden Pedro, PA-C  metoprolol succinate (TOPROL-XL) 25 MG 24 hr tablet Take 12.5 mg by mouth daily. 06/23/21  Yes [provider]  Multiple Vitamin (MULTIVITAMIN WITH MINERALS) TABS tablet Take 1 tablet by mouth daily. 07/10/21  Yes Sheikh, Omair Latif, DO  omeprazole (PRILOSEC) 20 MG capsule Take 20 mg by mouth daily before breakfast.  08/27/17  Yes [provider]  Polyethyl Glycol-Propyl Glycol (SYSTANE) 0.4-0.3 % GEL ophthalmic gel Place 1 Application into both eyes daily as needed (irritation).   Yes [provider]  polyethylene glycol (MIRALAX / GLYCOLAX) packet Take 17 g by mouth in the morning.   Yes [provider]  promethazine (PHENERGAN) 25 MG tablet Take 25 mg by mouth every 6 (six) hours as needed for nausea or vomiting.   Yes [provider]  PROTEIN PO Take 237 mLs by mouth daily.   Yes [provider]  rosuvastatin (CRESTOR) 5 MG tablet Take 5 mg by mouth daily.   Yes [provider]  sodium chloride 1 g tablet Take 1 g by mouth 2 (two) times daily. 06/23/21  Yes [provider]  escitalopram (LEXAPRO) 10 MG tablet Take 1 tablet (10 mg total) by mouth daily. Patient not taking: Reported on 09/05/2021 01/04/21   Flora Lipps, MD  feeding supplement (ENSURE ENLIVE / ENSURE PLUS) LIQD Take 237 mLs by mouth daily. Patient not taking: Reported on 09/09/2021 07/09/21   Raiford Noble Latif, DO  furosemide (LASIX) 20 MG tablet Take 1 tablet (20 mg total) by mouth daily. Take only if weight goes above 180 lbs. Patient taking differently: Take 20 mg by mouth daily as needed for fluid (weight gain of 3 - 4 lbs overnight). 02/21/21 09/05/21  Arrien, Jimmy Picket, MD  guaiFENesin-dextromethorphan  (ROBITUSSIN DM) 100-10 MG/5ML syrup Take 10 mLs by mouth every 4 (four) hours as needed for cough. Patient not taking: Reported on 09/05/2021 01/03/21   Pokhrel, Corrie Mckusick, MD  lidocaine (LIDODERM) 5 % Place 1 patch onto the skin daily. Remove & Discard patch within 12 hours or as directed by MD Patient not taking: Reported on 09/05/2021 07/10/21   Raiford Noble Latif, DO  lidocaine (XYLOCAINE) 2 % solution Use as directed 15 mLs in the mouth or throat every 6 (six) hours as needed for mouth pain. Patient not taking: Reported on 07/06/2021 12/18/20   Sherwood Gambler, MD  linaclotide [facility]) 145 MCG CAPS capsule Take 1 capsule (145 mcg total) by mouth daily at 4 PM. Patient not taking: Reported on 09/09/2021 10/03/20   Debbe Odea, MD  magic mouthwash SOLN Take 5 mLs by mouth 4 (four) times daily as needed for mouth pain. Pt is allergic to Magnesium Patient not taking: Reported on 07/06/2021  11/04/20   Walisiewicz, Verline Lema E, PA-C  magnesium oxide (MAG-OX) 400 (240 Mg) MG tablet Take 1 tablet (400 mg total) by mouth daily. Patient not taking: Reported on 07/06/2021 11/08/20   Heilingoetter, Cassandra L, PA-C  nystatin (MYCOSTATIN) 100000 UNIT/ML suspension Take 5 mLs (500,000 Units total) by mouth 4 (four) times daily. Patient not taking: Reported on 07/06/2021 12/16/20   Tyler Pita, MD  prochlorperazine (COMPAZINE) 10 MG tablet Take 1 tablet (10 mg total) by mouth every 6 (six) hours as needed for nausea or vomiting. Patient not taking: Reported on 09/09/2021 10/27/20   Curt Bears, MD  protein supplement (RESOURCE BENEPROTEIN) 6 g POWD Take 1 Scoop (6 g total) by mouth 3 (three) times daily with meals. Patient not taking: Reported on 09/05/2021 07/09/21   Raiford Noble Latif, DO  traMADol (ULTRAM) 50 MG tablet Take 50 mg by mouth 3 (three) times daily as needed for severe pain. 10/08/19   [provider]      Allergies    Amoxicillin-pot clavulanate, Doxycycline, Levaquin [levofloxacin in  d5w], Magnesium hydroxide, Oxycodone, and Other    Review of Systems   Review of Systems  Unable to perform ROS: Mental status change    Physical Exam Updated Vital Signs BP 125/70   Pulse 95   Temp 99.5 F (37.5 C) (Oral)   Resp (!) 30   Ht 5\' 7"  (1.702 m)   Wt 68.5 kg   LMP  (LMP Unknown)   SpO2 100%   BMI 23.65 kg/m  Physical Exam Vitals and nursing note reviewed.  Constitutional:      General: She is not in acute distress.    Appearance: She is well-developed.  HENT:     Head: Normocephalic and atraumatic.  Eyes:     Conjunctiva/sclera: Conjunctivae normal.  Cardiovascular:     Rate and Rhythm: Normal rate and regular rhythm.     Heart sounds: No murmur heard. Pulmonary:     Effort: Tachypnea and accessory muscle usage present. No respiratory distress.     Breath sounds: Examination of the right-upper field reveals rhonchi. Examination of the left-upper field reveals decreased breath sounds. Examination of the right-middle field reveals rhonchi. Examination of the left-middle field reveals decreased breath sounds. Examination of the right-lower field reveals rhonchi. Examination of the left-lower field reveals decreased breath sounds. Decreased breath sounds and rhonchi present.  Abdominal:     Palpations: Abdomen is soft.     Tenderness: There is no abdominal tenderness.  Musculoskeletal:        General: No swelling.     Cervical back: Neck supple.  Skin:    General: Skin is warm and dry.     Capillary Refill: Capillary refill takes less than 2 seconds.  Neurological:     Mental Status: She is alert.  Psychiatric:        Mood and Affect: Mood normal.     ED Results / Procedures / Treatments   Labs (all labs ordered are listed, but only abnormal results are displayed) Labs Reviewed  CBC WITH DIFFERENTIAL/PLATELET - Abnormal; Notable for the following components:      Result Value   WBC 16.9 (*)    RBC 3.66 (*)    MCV 105.2 (*)    MCH 37.2 (*)    Neutro  Abs 16.2 (*)    Lymphs Abs 0.2 (*)    All other components within normal limits  COMPREHENSIVE METABOLIC PANEL - Abnormal; Notable for the following components:   Sodium  132 (*)    Chloride 94 (*)    Glucose, Bld 145 (*)    Albumin 3.4 (*)    All other components within normal limits  CBG MONITORING, ED - Abnormal; Notable for the following components:   Glucose-Capillary 142 (*)    All other components within normal limits  I-STAT VENOUS BLOOD GAS, ED - Abnormal; Notable for the following components:   Bicarbonate 30.0 (*)    Acid-Base Excess 4.0 (*)    Sodium 131 (*)    Calcium, Ion 1.12 (*)    All other components within normal limits  CBG MONITORING, ED - Abnormal; Notable for the following components:   Glucose-Capillary 106 (*)    All other components within normal limits  CULTURE, BLOOD (ROUTINE X 2)  CULTURE, BLOOD (ROUTINE X 2)  AMMONIA  MAGNESIUM  URINALYSIS, ROUTINE W REFLEX MICROSCOPIC  PROCALCITONIN  COMPREHENSIVE METABOLIC PANEL  MAGNESIUM  CBC WITH DIFFERENTIAL/PLATELET    EKG EKG Interpretation  Date/Time:  Saturday September 09 2021 15:14:23 EDT Ventricular Rate:  101 PR Interval:  185 QRS Duration: 97 QT Interval:  382 QTC Calculation: 496 R Axis:   100 Text Interpretation: Sinus tachycardia Right axis deviation Low voltage, precordial leads Borderline T abnormalities, anterior leads Borderline prolonged QT interval Confirmed by Davonna Belling [phone]) on 09/09/2021 6:08:11 PM  Radiology CT Chest Wo Contrast  Result Date: 09/09/2021 CLINICAL DATA:  Pleural effusion. Malignancy suspected. History of lung cancer. EXAM: CT CHEST WITHOUT CONTRAST TECHNIQUE: Multidetector CT imaging of the chest was performed following the standard protocol without IV contrast. RADIATION DOSE REDUCTION: This exam was performed according to the departmental dose-optimization program which includes automated exposure control, adjustment of the mA and/or kV according to patient  size and/or use of iterative reconstruction technique. COMPARISON:  Chest radiograph dated 09/09/2021. Chest CT dated 09/04/2021. FINDINGS: Evaluation of this exam is limited in the absence of intravenous contrast. Cardiovascular: There is no cardiomegaly or pericardial effusion. Advanced 3 vessel coronary vascular calcification. Mild atherosclerotic calcification of the thoracic aorta. The central pulmonary arteries are grossly unremarkable. Mediastinum/Nodes: Subcarinal adenopathy measures approximately 16 mm in short axis. Evaluation of the left hilar lymph nodes is limited due to consolidative changes of the left lung. Esophagus is grossly unremarkable. No mediastinal fluid collection. Lungs/Pleura: Background of moderate centrilobular emphysema. Moderate left pleural effusion with near complete consolidation of the left lower lobe and partial consolidation of the lingula similar to prior CT. There is post radiation changes in the left upper lobe similar to prior CT. Interval resolution of the previously seen right pleural effusion. No pneumothorax. The central airways remain patent. Upper Abdomen: High attenuating content within gallbladder, likely vicarious excretion of contrast. Musculoskeletal: Osteopenia with degenerative changes of the spine. T7 compression fracture with greater than 50% loss of vertebral body height similar to prior CT. Fractures of the posterolateral ninth and eleventh rib on the right as seen on the prior CT. No new fracture. IMPRESSION: 1. Overall no significant interval change in the appearance of the lungs since the prior CT of 09/04/2021. 2. Moderate left pleural effusion with near complete consolidation of the left lower lobe and partial consolidation of the lingula similar to prior CT. 3. Post radiation changes in the left upper lobe similar to prior CT. 4. Interval resolution of the previously seen right pleural effusion. 5. Fractures of the posterolateral ninth and eleventh rib  on the right as seen on the prior CT. No new fracture. 6. Aortic Atherosclerosis (ICD10-I70.0) and Emphysema (  ICD10-J43.9). Electronically Signed   By: Anner Crete M.D.   On: 09/09/2021 20:15   CT Head Wo Contrast  Result Date: 09/09/2021 CLINICAL DATA:  Mental status change, unknown cause EXAM: CT HEAD WITHOUT CONTRAST TECHNIQUE: Contiguous axial images were obtained from the base of the skull through the vertex without intravenous contrast. RADIATION DOSE REDUCTION: This exam was performed according to the departmental dose-optimization program which includes automated exposure control, adjustment of the mA and/or kV according to patient size and/or use of iterative reconstruction technique. COMPARISON:  Head CT 09/30/2020, brain MRI 06/22/2021 FINDINGS: Brain: No intracranial hemorrhage, mass effect, or midline shift. No evidence of intracranial mass on this unenhanced exam. No hydrocephalus. The basilar cisterns are patent. Mild periventricular chronic small vessel ischemia. No evidence of territorial infarct or acute ischemia. No extra-axial or intracranial fluid collection. Vascular: Atherosclerosis of skullbase vasculature without hyperdense vessel or abnormal calcification. Skull: No fracture or focal lesion. Sinuses/Orbits: Occasional opacification of ethmoid air cells. Trace opacification of mastoid air cells. No sinus fluid levels. Other: None. IMPRESSION: 1. No acute intracranial abnormality. 2. Mild chronic small vessel ischemia. Electronically Signed   By: Keith Rake M.D.   On: 09/09/2021 18:46   DG Chest Portable 1 View  Result Date: 09/09/2021 CLINICAL DATA:  Shortness of breath EXAM: PORTABLE CHEST 1 VIEW COMPARISON:  Previous studies including the examination of 09/08/2021 FINDINGS: There is increased density in left lower lung field obscuring left hemidiaphragm. There are no signs of alveolar pulmonary edema in the parahilar regions. No new infiltrates are seen in right lung.  IMPRESSION: There is interval increased opacity in the left lower lung fields suggesting increase in left pleural effusion and underlying atelectasis/pneumonia. Electronically Signed   By: Elmer Picker M.D.   On: 09/09/2021 15:42   US Thoracentesis Asp Pleural space w/IMG guide  Result Date: 09/08/2021 INDICATION: Patient with history of small cell lung cancer, dyspnea, left pleural effusion. Request received for diagnostic and therapeutic left thoracentesis. EXAM: ULTRASOUND GUIDED DIAGNOSTIC AND THERAPEUTIC LEFT THORACENTESIS MEDICATIONS: 8 mL 1% lidocaine COMPLICATIONS: None immediate. PROCEDURE: An ultrasound guided thoracentesis was thoroughly discussed with the patient and questions answered. The benefits, risks, alternatives and complications were also discussed. The patient understands and wishes to proceed with the procedure. Written consent was obtained. Ultrasound was performed to localize and mark an adequate pocket of fluid in the left chest. The area was then prepped and draped in the normal sterile fashion. 1% Lidocaine was used for local anesthesia. Under ultrasound guidance a 6 Fr Safe-T-Centesis catheter was introduced. Thoracentesis was performed. The catheter was removed and a dressing applied. FINDINGS: A total of approximately 750 cc of yellow fluid was removed. Samples were sent to the laboratory as requested by the clinical team. Due to pt coughing only the above amount of fluid was removed today. IMPRESSION: Successful ultrasound guided diagnostic and therapeutic left thoracentesis yielding 750 cc of pleural fluid. Read by: Rowe Robert, PA-C Electronically Signed   By: Jerilynn Mages.  Shick M.D.   On: 09/08/2021 12:43   DG Chest 1 View  Result Date: 09/08/2021 CLINICAL DATA:  Status post thoracentesis. EXAM: CHEST  1 VIEW COMPARISON:  08/27/2021 FINDINGS: No evidence for pneumothorax. Left pleural effusion is decreased in the interval. Left mid lung and basilar atelectasis or scarring  evident. Bones are diffusely demineralized. IMPRESSION: Interval decrease in left pleural effusion. No pneumothorax. Electronically Signed   By: Misty Stanley M.D.   On: 09/08/2021 11:48    Procedures Procedures  Medications Ordered in ED Medications  amiodarone (PACERONE) tablet 200 mg (200 mg Oral Given 09/09/21 2217)  metoprolol succinate (TOPROL-XL) 24 hr tablet 25 mg (has no administration in time range)  rosuvastatin (CRESTOR) tablet 5 mg (5 mg Oral Given 09/09/21 2218)  heparin injection 5,000 Units (5,000 Units Subcutaneous Given 09/09/21 2218)  acetaminophen (TYLENOL) tablet 650 mg (650 mg Oral Given 09/09/21 2233)    Or  acetaminophen (TYLENOL) suppository 650 mg ( Rectal See Alternative 09/09/21 2233)  ondansetron (ZOFRAN) tablet 4 mg (has no administration in time range)    Or  ondansetron (ZOFRAN) injection 4 mg (has no administration in time range)  HYDROcodone-acetaminophen (NORCO/VICODIN) 5-325 MG per tablet 1-2 tablet (has no administration in time range)  albuterol (PROVENTIL) (2.5 MG/3ML) 0.083% nebulizer solution 2.5 mg (has no administration in time range)  cefTRIAXone (ROCEPHIN) 1 g in sodium chloride 0.9 % 100 mL IVPB (0 g Intravenous Stopped 09/09/21 2106)  azithromycin (ZITHROMAX) 500 mg in sodium chloride 0.9 % 250 mL IVPB (0 mg Intravenous Stopped 09/09/21 2112)  polyethylene glycol (MIRALAX / GLYCOLAX) packet 34 g (34 g Oral Given 09/09/21 2218)  bisacodyl (DULCOLAX) EC tablet 10 mg (10 mg Oral Given 09/09/21 2217)    ED Course/ Medical Decision Making/ A&P                           Medical Decision Making Amount and/or Complexity of Data Reviewed Labs: ordered. Radiology: ordered.  Risk Decision regarding hospitalization.   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 Of note, yesterday patient had a thoracentesis during which 750 cc fluid was removed from the left side.  Patient is not currently on antibiotics, however she did recently complete a course of antibiotics for UTI.  On arrival, patient is able to state her name, but is not able to carry a conversation.  She is following commands.  She arrived on a nonrebreather, however she was transitioned to her baseline 3 L nasal cannula.  Differential diagnosis includes pleural effusion, sepsis, AKI, CVA, hypoglycemia, pneumonia, electrolyte abnormalities, polypharmacy, pneumothorax, anemia, PE, CHF exacerbation. Low suspicion for seizure as patient is following commands on arrival and does not have history of seizure or seizure like activity.   Family arrived to bedside and verified patient's DNR status.  Chest x ray obtained which showed increase in left sided pleural effusion in comparison to post thoracentesis yesterday afternoon. No pneumothorax.  EKG showed sinus tachycardia, rate 101.  Normal axis.  No ST elevations or depressions.  QTc 496.  Labs reviewed and found to be notable for reassuring pH of 7.38, with PCO2 50.3.  Leukocytosis of 16.9, increased from normal 5 days ago.  No significant anemia.  Glucose 142.  PE considered, however patient was recently evaluated less than 1 week ago and did not have a pulmonary embolism at that time.  Due to having additional reason for shortness of breath evident on chest x-ray with large left-sided pleural effusion, additional PE work-up deferred at this time.  Additionally, patient is on Eliquis.  On re eval, patient is more alert and closer to baseline. Overall, concern for re accumulation of pleural effusion and possible pneumonia in setting of  AMS, productive cough, and leukocytosis. Rocephin and azithromycin given. Patient admitted to hospitalist service  and CT chest ordered for further evaluation of effusion.         Final Clinical Impression(s) / ED Diagnoses Final diagnoses:  None    Rx / DC Orders ED Discharge Orders     None         Rosine Abe, MD 09/09/21 8250    Davonna Belling, MD 09/10/21 1500

## 2021-09-09 NOTE — Assessment & Plan Note (Signed)
Pt has been awaiting kyphoplasty for 2-3 months now. Was scheduled for this week. Discussed with family that if pt has active infection with pneumonia, then most likely kyphoplasty would have to be canceled.

## 2021-09-09 NOTE — H&P (Signed)
.  History and Physical    Patricia [name] YIR:[id number] DOB: [birth year] DOA: 09/09/2021  DOS: the patient was seen and examined on 09/09/2021  PCP: [name], MD   Patient coming from: Home  I have personally briefly reviewed patient's old medical records in [place]  CC: AMS, SOB, hypoxia HPI: 71 year old Caucasian female history of small cell lung cancer left side, A-fib, recent vertebral fracture awaiting kyphoplasty, pleural effusion status postthoracentesis yesterday presents to the ER today with altered mental status and worsening shortness of breath.  Family states that she underwent a 750 mL thoracentesis yesterday.  Documented that thoracentesis was aborted early due to the patient coughing.  Family states the patient went home went to sleep.  This morning there had difficulty waking her up.  Patient did take some extra sleeping medicine yesterday including Klonopin, Soma, Atarax.  Patient had a nasal quality to her voice today.  She was also altered.  She was also hypoxic with supplemental oxygen saturations of 85% at home.  Patient brought to the ER.  Arrival temp 98.5 heart rate 101 blood pressure 170/123 satting 94% on 4 L.  Labs showed a white count of 16.9, hemoglobin 13.5, platelets of 178.  Notable is her CBC from 5 days ago on 09/04/2021 showed a white count of 5.2, hemoglobin 10.6, platelets 135  CMP today showed a sodium 132, potassium 3.9, chloride of 94, BUN of 18, creatinine 0.98  Ammonia level normal at 19.  CT head showed no acute intracranial abnormality.  Chronic small vessel ischemic disease.  Chest x-ray showed personally viewed interpreted shows new left-sided pleural effusion.  [facility] contacted for admission.   ED Course: cxr shows left pleural effusion  Review of Systems:  Review of Systems  Constitutional:  Negative for chills and fever.  HENT: Negative.    Eyes: Negative.   Respiratory:  Positive for cough, sputum  production and shortness of breath.   Cardiovascular: Negative.   Gastrointestinal:  Positive for constipation.       Constipation for 3 days  Genitourinary: Negative.   Musculoskeletal:  Positive for back pain.  Skin: Negative.   Neurological:  Positive for speech change and weakness.  Endo/Heme/Allergies: Negative.   Psychiatric/Behavioral: Negative.    All other systems reviewed and are negative.   Past Medical History:  Diagnosis Date   Anemia    Anxiety    GERD (gastroesophageal reflux disease)    Hyperlipidemia    Hypertension    IBS (irritable bowel syndrome)    Insomnia    Low back pain    Lung cancer ([place]) 10/2020   TIA (transient ischemic attack) 10/2017   no deficits   Vitamin B12 deficiency    Vitamin D deficiency     Past Surgical History:  Procedure Laterality Date   BACK SURGERY     BREAST EXCISIONAL BIOPSY Left 1997   benign   BRONCHIAL BIOPSY  10/18/2020   Procedure: BRONCHIAL BIOPSIES;  Surgeon: [name], DO;  Location: [facility];  Service: Pulmonary;;   BRONCHIAL BRUSHINGS  10/18/2020   Procedure: BRONCHIAL BRUSHINGS;  Surgeon: [name], DO;  Location: [facility];  Service: Pulmonary;;   BRONCHIAL NEEDLE ASPIRATION BIOPSY  10/18/2020   Procedure: BRONCHIAL NEEDLE ASPIRATION BIOPSIES;  Surgeon: [name], DO;  Location: [facility];  Service: Pulmonary;;   BRONCHIAL WASHINGS  10/18/2020   Procedure: BRONCHIAL WASHINGS;  Surgeon: [name], DO;  Location: [facility];  Service: Pulmonary;;  CARDIOVERSION N/A 02/13/2021   Procedure: CARDIOVERSION;  Surgeon: Werner Lean, MD;  Location: [facility];  Service: Cardiovascular;  Laterality: N/A;   CARDIOVERSION N/A 02/20/2021   Procedure: CARDIOVERSION;  Surgeon: Skeet Latch, MD;  Location: [facility];  Service: Cardiovascular;  Laterality: N/A;   CARDIOVERSION N/A 03/22/2021   Procedure: CARDIOVERSION;  Surgeon: Freada Bergeron, MD;  Location: [facility]  ENDOSCOPY;  Service: Cardiovascular;  Laterality: N/A;   hemorrhoidecotmy     HERNIA MESH REMOVAL     OPEN REDUCTION INTERNAL FIXATION (ORIF) DISTAL RADIAL FRACTURE Right 11/19/2019   Procedure: OPEN REDUCTION INTERNAL FIXATION (ORIF) DISTAL RADIUS AND ULNA FRACTURE WITH REPAIR AS NECESSARY;  Surgeon: Roseanne Kaufman, MD;  Location: [facility];  Service: Orthopedics;  Laterality: Right;  2 hrs Block with IV sedation   ORIF RADIUS & ULNA FRACTURES     TONSILLECTOMY     VIDEO BRONCHOSCOPY WITH ENDOBRONCHIAL NAVIGATION Left 10/18/2020   Procedure: VIDEO BRONCHOSCOPY WITH ENDOBRONCHIAL NAVIGATION;  Surgeon: [name], DO;  Location: [facility];  Service: Pulmonary;  Laterality: Left;  ION   VIDEO BRONCHOSCOPY WITH ENDOBRONCHIAL ULTRASOUND Bilateral 10/18/2020   Procedure: VIDEO BRONCHOSCOPY WITH ENDOBRONCHIAL ULTRASOUND;  Surgeon: [name], DO;  Location: [facility];  Service: Pulmonary;  Laterality: Bilateral;   VIDEO BRONCHOSCOPY WITH RADIAL ENDOBRONCHIAL ULTRASOUND  10/18/2020   Procedure: VIDEO BRONCHOSCOPY WITH RADIAL ENDOBRONCHIAL ULTRASOUND;  Surgeon: [name], DO;  Location: [place] [facility];  Service: Pulmonary;;     reports that she quit smoking about 20 months ago. Her smoking use included cigarettes. She started smoking about 51 years ago. She has a 50.00 pack-year smoking history. She has never used smokeless tobacco. She reports that she does not currently use alcohol. She reports that she does not use drugs.  Allergies  Allergen Reactions   Amoxicillin-Pot Clavulanate Nausea Only and Other (See Comments)    GI upset    Doxycycline Swelling    Oral swelling   Levaquin [Levofloxacin In D5w] Other (See Comments)    Joint problems    Magnesium Hydroxide Other (See Comments)    Welts    Oxycodone Nausea Only   Other Itching    Paper tape    Family History  Problem Relation Age of Onset   Breast cancer Sister    Breast cancer Maternal Aunt    Breast  cancer Paternal Aunt     Prior to Admission medications   Medication Sig Start Date End Date Taking? Authorizing Provider  acetaminophen (TYLENOL) 325 MG tablet Take 650 mg by mouth every 6 (six) hours as needed for headache or moderate pain.    [provider]  albuterol (VENTOLIN HFA) 108 (90 Base) MCG/ACT inhaler Inhale 2 puffs into the lungs every 6 (six) hours as needed for shortness of breath. 03/04/21   [provider]  amiodarone (PACERONE) 200 MG tablet Take 1 tablet (200 mg total) by mouth daily. 04/24/21   Camnitz, Will Hassell Done, MD  apixaban (ELIQUIS) 5 MG TABS tablet TAKE 1 TABLET(5 MG) BY MOUTH TWICE DAILY 06/12/21   Lelon Perla, MD  Black Cohosh (REMIFEMIN PO) Take 1 tablet by mouth 2 (two) times daily.    [provider]  carboxymethylcellulose (REFRESH PLUS) 0.5 % SOLN Place 1 drop into both eyes 3 (three) times daily as needed (for dryness).     [provider]  Cholecalciferol (VITAMIN D3 PO) Take 2 capsules by mouth daily.    [provider]  clobetasol ointment (TEMOVATE) 0.05 %  Apply 1 application. topically daily as needed (vaginal irritation).    [provider]  clonazePAM (KLONOPIN) 1 MG tablet Take 1 mg by mouth at bedtime.  10/15/17   [provider]  Coenzyme Q10 200 MG capsule Take 400 mg by mouth daily.    [provider]  Cyanocobalamin (B-12 PO) Take 2 capsules by mouth daily.    [provider]  docusate sodium (COLACE) 100 MG capsule Take 1 capsule (100 mg total) by mouth 2 (two) times daily. Patient taking differently: Take 100 mg by mouth daily. 01/03/21   Pokhrel, Corrie Mckusick, MD  escitalopram (LEXAPRO) 10 MG tablet Take 1 tablet (10 mg total) by mouth daily. Patient not taking: Reported on 09/05/2021 01/04/21   Flora Lipps, MD  escitalopram (LEXAPRO) 20 MG tablet Take 20 mg by mouth daily. 08/15/21   [provider]  feeding supplement (ENSURE ENLIVE / ENSURE PLUS) LIQD  Take 237 mLs by mouth daily. Patient taking differently: Take 237 mLs by mouth 2 (two) times daily between meals. 07/09/21   Sheikh, Omair Latif, DO  fluticasone (FLONASE) 50 MCG/ACT nasal spray Place 2 sprays into both nostrils every evening. 10/11/17   [provider]  furosemide (LASIX) 20 MG tablet Take 1 tablet (20 mg total) by mouth daily. Take only if weight goes above 180 lbs. Patient taking differently: Take 20 mg by mouth daily as needed for fluid (weight gain of 3 - 4 lbs overnight). 02/21/21 09/05/21  Arrien, Jimmy Picket, MD  guaiFENesin-dextromethorphan (ROBITUSSIN DM) 100-10 MG/5ML syrup Take 10 mLs by mouth every 4 (four) hours as needed for cough. Patient not taking: Reported on 09/05/2021 01/03/21   Flora Lipps, MD  HYDROcodone-acetaminophen (NORCO) 10-325 MG tablet Take 1 tablet by mouth every 6 (six) hours as needed for pain. 08/24/21   [provider]  HYDROcodone-acetaminophen (NORCO/VICODIN) 5-325 MG tablet Take 2 tablets by mouth every 4 (four) hours as needed. Patient not taking: Reported on 09/05/2021 07/26/21   Sponseller, Gypsy Balsam, PA-C  hydrOXYzine (ATARAX) 25 MG tablet Take 25 mg by mouth at bedtime as needed for sleep or anxiety. 03/05/21   [provider]  ipratropium (ATROVENT) 0.02 % nebulizer solution Take 0.5 mg by nebulization 3 (three) times daily as needed for shortness of breath or wheezing. 01/05/21   [provider]  levalbuterol Penne Lash) 1.25 MG/3ML nebulizer solution Inhale 1.25 mg into the lungs every 8 (eight) hours as needed for shortness of breath or wheezing. 01/10/21   [provider]  lidocaine (LIDODERM) 5 % Place 1 patch onto the skin daily. Remove & Discard patch within 12 hours or as directed by MD Patient not taking: Reported on 09/05/2021 07/10/21   Raiford Noble Latif, DO  lidocaine (XYLOCAINE) 2 % solution Use as directed 15 mLs in the mouth or throat every 6 (six) hours as needed for mouth pain. Patient  not taking: Reported on 07/06/2021 12/18/20   Sherwood Gambler, MD  linaclotide [facility]) 145 MCG CAPS capsule Take 1 capsule (145 mcg total) by mouth daily at 4 PM. Patient taking differently: Take 145 mcg by mouth daily as needed (laxative). 10/03/20   Debbe Odea, MD  magic mouthwash SOLN Take 5 mLs by mouth 4 (four) times daily as needed for mouth pain. Pt is allergic to Magnesium Patient not taking: Reported on 07/06/2021 11/04/20   Sherol Dade E, PA-C  magnesium oxide (MAG-OX) 400 (240 Mg) MG tablet Take 1 tablet (400 mg total) by mouth daily. Patient not taking: Reported  on 07/06/2021 11/08/20   Heilingoetter, Cassandra L, PA-C  memantine (NAMENDA) 10 MG tablet Take 1 tablet (10 mg total) by mouth 2 (two) times daily. Patient taking differently: Take 10 mg by mouth daily. 02/13/21   Hayden Pedro, PA-C  metoprolol succinate (TOPROL-XL) 25 MG 24 hr tablet Take 25 mg by mouth daily. 06/23/21   [provider]  Multiple Vitamin (MULTIVITAMIN WITH MINERALS) TABS tablet Take 1 tablet by mouth daily. 07/10/21   Raiford Noble Latif, DO  nystatin (MYCOSTATIN) 100000 UNIT/ML suspension Take 5 mLs (500,000 Units total) by mouth 4 (four) times daily. Patient not taking: Reported on 07/06/2021 12/16/20   Tyler Pita, MD  omeprazole (PRILOSEC) 20 MG capsule Take 20 mg by mouth daily before breakfast.  08/27/17   [provider]  Polyethyl Glycol-Propyl Glycol (SYSTANE) 0.4-0.3 % GEL ophthalmic gel Place 1 Application into both eyes daily as needed (irritation).    [provider]  polyethylene glycol (MIRALAX / GLYCOLAX) packet Take 17 g by mouth in the morning.    [provider]  prochlorperazine (COMPAZINE) 10 MG tablet Take 1 tablet (10 mg total) by mouth every 6 (six) hours as needed for nausea or vomiting. 10/27/20   Curt Bears, MD  protein supplement (RESOURCE BENEPROTEIN) 6 g POWD Take 1 Scoop (6 g total) by mouth 3 (three) times daily with  meals. Patient not taking: Reported on 09/05/2021 07/09/21   Raiford Noble Latif, DO  rosuvastatin (CRESTOR) 5 MG tablet Take 5 mg by mouth daily.    [provider]  sodium chloride 1 g tablet Take 1 g by mouth 2 (two) times daily. 06/23/21   [provider]  traMADol (ULTRAM) 50 MG tablet Take 50 mg by mouth 3 (three) times daily as needed for severe pain. 10/08/19   [provider]  zaleplon (SONATA) 10 MG capsule Take 10 mg by mouth at bedtime as needed for sleep.    [provider]    Physical Exam: Vitals:   09/09/21 1755 09/09/21 1800 09/09/21 1859 09/09/21 1900  BP: 102/62 (!) 114/51 138/83 (!) 119/109  Pulse: 93 94 99 99  Resp: (!) 27 (!) 28 (!) 38 (!) 33  Temp:    99.5 F (37.5 C)  TempSrc:    Oral  SpO2: 100% 100% 100% 100%  Weight:      Height:        Physical Exam Vitals and nursing note reviewed.  Constitutional:      General: She is not in acute distress.    Appearance: She is not toxic-appearing or diaphoretic.  HENT:     Head: Normocephalic and atraumatic.     Nose: Nose normal.  Eyes:     General: No scleral icterus. Cardiovascular:     Rate and Rhythm: Normal rate and regular rhythm.  Pulmonary:     Breath sounds: Examination of the left-middle field reveals rhonchi. Rhonchi present.     Comments: No BS in left base Abdominal:     General: Bowel sounds are normal. There is distension.     Tenderness: There is no abdominal tenderness. There is no guarding or rebound.  Musculoskeletal:     Right lower leg: No edema.     Left lower leg: No edema.  Skin:    General: Skin is warm and dry.  Neurological:     General: No focal deficit present.     Mental Status: She is alert and oriented to person, place, and time.  Comments: Nasal-quality to her speech(not her normal per family). AxOx4      Labs on Admission: I have personally reviewed following labs and imaging studies  CBC: Recent Labs  Lab 09/04/21 1858  09/09/21 1515 09/09/21 1524  WBC 5.2 16.9*  --   NEUTROABS 4.6 16.2*  --   HGB 10.6* 13.6 13.9  HCT 31.4* 38.5 41.0  MCV 108.7* 105.2*  --   PLT 135* 178  --    Basic Metabolic Panel: Recent Labs  Lab 09/04/21 1858 09/09/21 1515 09/09/21 1524  NA 131* 132* 131*  K 3.9 3.9 4.0  CL 92* 94*  --   CO2 28 26  --   GLUCOSE 112* 145*  --   BUN 10 18  --   CREATININE 0.67 0.96  --   CALCIUM 8.9 9.1  --   MG  --  1.7  --    GFR: Estimated Creatinine Clearance: 51.5 mL/min (by C-G formula based on SCr of 0.96 mg/dL). Liver Function Tests: Recent Labs  Lab 09/04/21 1858 09/09/21 1515  AST 48* 33  ALT 53* 44  ALKPHOS 94 96  BILITOT 0.6 0.5  PROT 6.8 6.8  ALBUMIN 3.6 3.4*   No results for input(s): "LIPASE", "AMYLASE" in the last 168 hours. Recent Labs  Lab 09/09/21 1520  AMMONIA 19   Coagulation Profile: No results for input(s): "INR", "PROTIME" in the last 168 hours. Cardiac Enzymes: Recent Labs  Lab 09/04/21 1858  TROPONINIHS 4   BNP (last 3 results) No results for input(s): "PROBNP" in the last 8760 hours. HbA1C: No results for input(s): "HGBA1C" in the last 72 hours. CBG: Recent Labs  Lab 09/09/21 1557 09/09/21 1915  GLUCAP 142* 106*   Lipid Profile: No results for input(s): "CHOL", "HDL", "LDLCALC", "TRIG", "CHOLHDL", "LDLDIRECT" in the last 72 hours. Thyroid Function Tests: No results for input(s): "TSH", "T4TOTAL", "FREET4", "T3FREE", "THYROIDAB" in the last 72 hours. Anemia Panel: No results for input(s): "VITAMINB12", "FOLATE", "FERRITIN", "TIBC", "IRON", "RETICCTPCT" in the last 72 hours. Urine analysis:    Component Value Date/Time   COLORURINE YELLOW 08/03/2021 1700   APPEARANCEUR CLEAR 08/03/2021 1700   LABSPEC 1.020 08/03/2021 1700   PHURINE 7.0 08/03/2021 1700   GLUCOSEU NEGATIVE 08/03/2021 1700   HGBUR NEGATIVE 08/03/2021 1700   BILIRUBINUR NEGATIVE 08/03/2021 1700   KETONESUR NEGATIVE 08/03/2021 1700   PROTEINUR 30 (A) 08/03/2021  1700   NITRITE NEGATIVE 08/03/2021 1700   LEUKOCYTESUR NEGATIVE 08/03/2021 1700    Radiological Exams on Admission: I have personally reviewed images CT Head Wo Contrast  Result Date: 09/09/2021 CLINICAL DATA:  Mental status change, unknown cause EXAM: CT HEAD WITHOUT CONTRAST TECHNIQUE: Contiguous axial images were obtained from the base of the skull through the vertex without intravenous contrast. RADIATION DOSE REDUCTION: This exam was performed according to the departmental dose-optimization program which includes automated exposure control, adjustment of the mA and/or kV according to patient size and/or use of iterative reconstruction technique. COMPARISON:  Head CT 09/30/2020, brain MRI 06/22/2021 FINDINGS: Brain: No intracranial hemorrhage, mass effect, or midline shift. No evidence of intracranial mass on this unenhanced exam. No hydrocephalus. The basilar cisterns are patent. Mild periventricular chronic small vessel ischemia. No evidence of territorial infarct or acute ischemia. No extra-axial or intracranial fluid collection. Vascular: Atherosclerosis of skullbase vasculature without hyperdense vessel or abnormal calcification. Skull: No fracture or focal lesion. Sinuses/Orbits: Occasional opacification of ethmoid air cells. Trace opacification of mastoid air cells. No sinus fluid levels. Other: None. IMPRESSION: 1.  No acute intracranial abnormality. 2. Mild chronic small vessel ischemia. Electronically Signed   By: Keith Rake M.D.   On: 09/09/2021 18:46   DG Chest Portable 1 View  Result Date: 09/09/2021 CLINICAL DATA:  Shortness of breath EXAM: PORTABLE CHEST 1 VIEW COMPARISON:  Previous studies including the examination of 09/08/2021 FINDINGS: There is increased density in left lower lung field obscuring left hemidiaphragm. There are no signs of alveolar pulmonary edema in the parahilar regions. No new infiltrates are seen in right lung. IMPRESSION: There is interval increased opacity  in the left lower lung fields suggesting increase in left pleural effusion and underlying atelectasis/pneumonia. Electronically Signed   By: Elmer Picker M.D.   On: 09/09/2021 15:42   US Thoracentesis Asp Pleural space w/IMG guide  Result Date: 09/08/2021 INDICATION: Patient with history of small cell lung cancer, dyspnea, left pleural effusion. Request received for diagnostic and therapeutic left thoracentesis. EXAM: ULTRASOUND GUIDED DIAGNOSTIC AND THERAPEUTIC LEFT THORACENTESIS MEDICATIONS: 8 mL 1% lidocaine COMPLICATIONS: None immediate. PROCEDURE: An ultrasound guided thoracentesis was thoroughly discussed with the patient and questions answered. The benefits, risks, alternatives and complications were also discussed. The patient understands and wishes to proceed with the procedure. Written consent was obtained. Ultrasound was performed to localize and mark an adequate pocket of fluid in the left chest. The area was then prepped and draped in the normal sterile fashion. 1% Lidocaine was used for local anesthesia. Under ultrasound guidance a 6 Fr Safe-T-Centesis catheter was introduced. Thoracentesis was performed. The catheter was removed and a dressing applied. FINDINGS: A total of approximately 750 cc of yellow fluid was removed. Samples were sent to the laboratory as requested by the clinical team. Due to pt coughing only the above amount of fluid was removed today. IMPRESSION: Successful ultrasound guided diagnostic and therapeutic left thoracentesis yielding 750 cc of pleural fluid. Read by: Rowe Robert, PA-C Electronically Signed   By: Jerilynn Mages.  Shick M.D.   On: 09/08/2021 12:43   DG Chest 1 View  Result Date: 09/08/2021 CLINICAL DATA:  Status post thoracentesis. EXAM: CHEST  1 VIEW COMPARISON:  08/27/2021 FINDINGS: No evidence for pneumothorax. Left pleural effusion is decreased in the interval. Left mid lung and basilar atelectasis or scarring evident. Bones are diffusely demineralized.  IMPRESSION: Interval decrease in left pleural effusion. No pneumothorax. Electronically Signed   By: Misty Stanley M.D.   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 Pt is s/p left thoracentesis yesterday. cxr shows reaccumulation of her pleural effusion. Doubt hemothorax as pt's HgB is actually higher today than 5 days ago. Cytology pending on pleural fluid from yesterday. Will obtain CT chest to evaluate.  Her productive cough could be from pneumonia or from retained secretions trapped in her lung due to her pleural effusion. And now that part of her pleural effusion has been removed, she is now starting to mobilizes these secretions. EDP has ordered dose of IV rocephin/zithromax. Will hold off on further abx treatment until recurrent  pleural effusion +- pneumonia has been evaluated with CT.  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 Pt has not had any surgery. Oncology concerned about enlarged pulmonary lymph nodes. Possible metastasis to LN. They wanted an outpatient PET/CT. Discussed with family that pt's pleural effusion could be malignant and that would mean that pt's lung cancer has returned. Awaiting pleural fluid cytology  Dysarthria Initial head CT is negative. Pt stopped her Eliquis 48 hours ago in preparation for kyphoplasty for her vertebral fracture. Pt without any focal deficits now except for an exceedingly nasal-quality  voice...something her family states is not normal for pt's speech pattern.  May need MRI brain to further evaluate. Pt outside window for any intervention as her LKN was evening of 09-08-2021.  DNR (do not resuscitate)/DNI(Do Not Intubate) Verified with pt. Witnessed by her husband and both her dtrs  Fracture of seventh thoracic vertebra ([place]) Pt has been awaiting kyphoplasty for 2-3 months now. Was scheduled for this week. Discussed with family that if pt has active infection with pneumonia, then most likely kyphoplasty would have to be canceled.  Chronic respiratory failure with hypoxia (HCC) Continue supplemental O2. Normally wearing 2-3 L/min at home.  AF (paroxysmal atrial fibrillation) (HCC) Stable. Off eliquis for 48 hous due to upcoming kyphoplasty. In NSR.  Essential hypertension Stable.   DVT prophylaxis: SQ Heparin Code Status: DNR/DNI(Do NOT Intubate). Verified with pt. Witnessed by husband and both dtrs. Family Communication: discussed with pt, husband and both dtr  Disposition Plan: return home  Consults called: none  Admission status: Inpatient, Med-Surg   Kristopher Oppenheim, DO [facility] 09/09/2021, 7:58 PM

## 2021-09-09 NOTE — Subjective & Objective (Signed)
CC: AMS, SOB, hypoxia HPI: 71 year old Caucasian female history of small cell lung cancer left side, A-fib, recent vertebral fracture awaiting kyphoplasty, pleural effusion status postthoracentesis yesterday presents to the ER today with altered mental status and worsening shortness of breath.  Family states that she underwent a 750 mL thoracentesis yesterday.  Documented that thoracentesis was aborted early due to the patient coughing.  Family states the patient went home went to sleep.  This morning there had difficulty waking her up.  Patient did take some extra sleeping medicine yesterday including Klonopin, Soma, Atarax.  Patient had a nasal quality to her voice today.  She was also altered.  She was also hypoxic with supplemental oxygen saturations of 85% at home.  Patient brought to the ER.  Arrival temp 98.5 heart rate 101 blood pressure 170/123 satting 94% on 4 L.  Labs showed a white count of 16.9, hemoglobin 13.5, platelets of 178.  Notable is her CBC from 5 days ago on 09/04/2021 showed a white count of 5.2, hemoglobin 10.6, platelets 135  CMP today showed a sodium 132, potassium 3.9, chloride of 94, BUN of 18, creatinine 0.98  Ammonia level normal at 19.  CT head showed no acute intracranial abnormality.  Chronic small vessel ischemic disease.  Chest x-ray showed personally viewed interpreted shows new left-sided pleural effusion.  [facility] hospital[facility] contacted for admission.

## 2021-09-09 NOTE — Assessment & Plan Note (Signed)
Stable

## 2021-09-09 NOTE — Assessment & Plan Note (Signed)
Pt is s/p chemo/XRT. Pt has not had any surgery. Oncology concerned about enlarged pulmonary lymph nodes. Possible metastasis to LN. They wanted an outpatient PET/CT. Discussed with family that pt's pleural effusion could be malignant and that would mean that pt's lung cancer has returned. Awaiting pleural fluid cytology

## 2021-09-09 NOTE — ED Notes (Signed)
The tech and I attempted to straight cath pt to get urine and did not get any urine.

## 2021-09-09 NOTE — ED Notes (Signed)
ED TO INPATIENT HANDOFF REPORT  ED Nurse Name and Phone #: Shirlee Limerick [phone]  S Name/Age/Gender Patricia [name] 71 y.o. female Room/Bed: 015C/015C  Code Status   Code Status: DNR  Home/SNF/Other Home Patient oriented to: self, place, time, and situation Is this baseline? Yes   Triage Complete: Triage complete  Chief Complaint Recurrent left pleural effusion [J90]  Triage Note Pt arrived via [facility] from home for AMS, lethargic, respiratory distress. Per EMS, pt was 83% on 3L 02 per [place]. Pt had pleurocentesis yesterday. Pt respiratory sounds rattling. MD at bedside. Pt wears 3L 02 at baseline.   Allergies Allergies  Allergen Reactions   Amoxicillin-Pot Clavulanate Nausea Only and Other (See Comments)    GI upset    Doxycycline Swelling    Oral swelling   Levaquin [Levofloxacin In D5w] Other (See Comments)    Joint problems    Magnesium Hydroxide Other (See Comments)    Welts    Oxycodone Nausea Only   Other Itching    Paper tape    Level of Care/Admitting Diagnosis ED Disposition     ED Disposition  Admit   Condition  --   [facility] Area: [place] [[id number]]  Level of Care: Med-Surg [16]  May admit patient to [facility] or [facility] if equivalent level of care is available:: No  Covid Evaluation: Asymptomatic - no recent exposure (last 10 days) testing not required  Diagnosis: Recurrent left pleural effusion [[id number]]  Admitting Physician: Bridgett Larsson, [place]  Attending Physician: Bridgett Larsson, ERIC [[id number]]  Certification:: I certify this patient will need inpatient services for at least 2 midnights  Estimated Length of Stay: 4          B Medical/Surgery History Past Medical History:  Diagnosis Date   Anemia    Anxiety    GERD (gastroesophageal reflux disease)    Hyperlipidemia    Hypertension    IBS (irritable bowel syndrome)    Insomnia    Low back pain    Lung cancer ([place]) 10/2020   TIA (transient ischemic attack) 10/2017    no deficits   Vitamin B12 deficiency    Vitamin D deficiency    Past Surgical History:  Procedure Laterality Date   BACK SURGERY     BREAST EXCISIONAL BIOPSY Left 1997   benign   BRONCHIAL BIOPSY  10/18/2020   Procedure: BRONCHIAL BIOPSIES;  Surgeon: [name], DO;  Location: [place];  Service: Pulmonary;;   BRONCHIAL BRUSHINGS  10/18/2020   Procedure: BRONCHIAL BRUSHINGS;  Surgeon: [name], DO;  Location: [place];  Service: Pulmonary;;   BRONCHIAL NEEDLE ASPIRATION BIOPSY  10/18/2020   Procedure: BRONCHIAL NEEDLE ASPIRATION BIOPSIES;  Surgeon: [name], DO;  Location: [place];  Service: Pulmonary;;   BRONCHIAL WASHINGS  10/18/2020   Procedure: BRONCHIAL WASHINGS;  Surgeon: [name], DO;  Location: [place];  Service: Pulmonary;;   CARDIOVERSION N/A 02/13/2021   Procedure: CARDIOVERSION;  Surgeon: Werner Lean, MD;  Location: [place];  Service: Cardiovascular;  Laterality: N/A;   CARDIOVERSION N/A 02/20/2021   Procedure: CARDIOVERSION;  Surgeon: Skeet Latch, MD;  Location: [place];  Service: Cardiovascular;  Laterality: N/A;   CARDIOVERSION N/A 03/22/2021   Procedure: CARDIOVERSION;  Surgeon: Freada Bergeron, MD;  Location: [place];  Service: Cardiovascular;  Laterality: N/A;   hemorrhoidecotmy     HERNIA MESH REMOVAL     OPEN REDUCTION INTERNAL FIXATION (ORIF) DISTAL RADIAL FRACTURE Right 11/19/2019  Procedure: OPEN REDUCTION INTERNAL FIXATION (ORIF) DISTAL RADIUS AND ULNA FRACTURE WITH REPAIR AS NECESSARY;  Surgeon: Roseanne Kaufman, MD;  Location: [place];  Service: Orthopedics;  Laterality: Right;  2 hrs Block with IV sedation   ORIF RADIUS & ULNA FRACTURES     TONSILLECTOMY     VIDEO BRONCHOSCOPY WITH ENDOBRONCHIAL NAVIGATION Left 10/18/2020   Procedure: VIDEO BRONCHOSCOPY WITH ENDOBRONCHIAL NAVIGATION;  Surgeon: [name], DO;  Location: [place];  Service: Pulmonary;  Laterality:  Left;  ION   VIDEO BRONCHOSCOPY WITH ENDOBRONCHIAL ULTRASOUND Bilateral 10/18/2020   Procedure: VIDEO BRONCHOSCOPY WITH ENDOBRONCHIAL ULTRASOUND;  Surgeon: [name], DO;  Location: [place];  Service: Pulmonary;  Laterality: Bilateral;   VIDEO BRONCHOSCOPY WITH RADIAL ENDOBRONCHIAL ULTRASOUND  10/18/2020   Procedure: VIDEO BRONCHOSCOPY WITH RADIAL ENDOBRONCHIAL ULTRASOUND;  Surgeon: [name], DO;  Location: [place] [facility];  Service: Pulmonary;;     A IV Location/Drains/Wounds Patient Lines/Drains/Airways Status     Active Line/Drains/Airways     Name Placement date Placement time Site Days   Peripheral IV 09/09/21 20 G Left Antecubital 09/09/21  1523  Antecubital  less than 1   Peripheral IV 09/09/21 20 G Right Antecubital 09/09/21  1538  Antecubital  less than 1   External Urinary Catheter 09/09/21  1920  --  less than 1            Intake/Output Last 24 hours No intake or output data in the 24 hours ending 09/09/21 1949  Labs/Imaging Results for orders placed or performed during the hospital encounter of 09/09/21 (from the past 48 hour(s))  CBC with Differential     Status: Abnormal   Collection Time: 09/09/21  3:15 PM  Result Value Ref Range   WBC 16.9 (H) 4.0 - 10.5 K/uL   RBC 3.66 (L) 3.87 - 5.11 MIL/uL   Hemoglobin 13.6 12.0 - 15.0 g/dL   HCT 38.5 36.0 - 46.0 %   MCV 105.2 (H) 80.0 - 100.0 fL   MCH 37.2 (H) 26.0 - 34.0 pg   MCHC 35.3 30.0 - 36.0 g/dL   RDW 14.1 11.5 - 15.5 %   Platelets 178 150 - 400 K/uL   nRBC 0.0 0.0 - 0.2 %   Neutrophils Relative % 96 %   Neutro Abs 16.2 (H) 1.7 - 7.7 K/uL   Lymphocytes Relative 1 %   Lymphs Abs 0.2 (L) 0.7 - 4.0 K/uL   Monocytes Relative 3 %   Monocytes Absolute 0.5 0.1 - 1.0 K/uL   Eosinophils Relative 0 %   Eosinophils Absolute 0.0 0.0 - 0.5 K/uL   Basophils Relative 0 %   Basophils Absolute 0.0 0.0 - 0.1 K/uL   Immature Granulocytes 0 %   Abs Immature Granulocytes 0.05 0.00 - 0.07 K/uL    Comment:  Performed at [facility], [address], [place], [place] [address]  Magnesium     Status: None   Collection Time: 09/09/21  3:15 PM  Result Value Ref Range   Magnesium 1.7 1.7 - 2.4 mg/dL    Comment: Performed at [facility], [place] [address] [place], [place] [address]  Comprehensive metabolic panel     Status: Abnormal   Collection Time: 09/09/21  3:15 PM  Result Value Ref Range   Sodium 132 (L) 135 - 145 mmol/L   Potassium 3.9 3.5 - 5.1 mmol/L   Chloride 94 (L) 98 - 111 mmol/L   CO2 26 22 - 32 mmol/L   Glucose,  Bld 145 (H) 70 - 99 mg/dL    Comment: Glucose reference range applies only to samples taken after fasting for at least 8 hours.   BUN 18 8 - 23 mg/dL   Creatinine, Ser 0.96 0.44 - 1.00 mg/dL   Calcium 9.1 8.9 - 10.3 mg/dL   Total Protein 6.8 6.5 - 8.1 g/dL   Albumin 3.4 (L) 3.5 - 5.0 g/dL   AST 33 15 - 41 U/L   ALT 44 0 - 44 U/L   Alkaline Phosphatase 96 38 - 126 U/L   Total Bilirubin 0.5 0.3 - 1.2 mg/dL   GFR, Estimated >60 >60 mL/min    Comment: (NOTE) Calculated using the CKD-EPI Creatinine Equation (2021)    Anion gap 12 5 - 15    Comment: Performed at [facility] [address] [place], [place] [address]  Ammonia     Status: None   Collection Time: 09/09/21  3:20 PM  Result Value Ref Range   Ammonia 19 9 - 35 umol/L    Comment: Performed at [facility], [place] [address] [place], [place] [address]  I-Stat venous blood gas, [facility] ED only)     Status: Abnormal   Collection Time: 09/09/21  3:24 PM  Result Value Ref Range   pH, Ven 7.383 7.25 - 7.43   pCO2, Ven 50.3 44 - 60 mmHg   pO2, Ven 33 32 - 45 mmHg   Bicarbonate 30.0 (H) 20.0 - 28.0 mmol/L   TCO2 32 22 - 32 mmol/L   O2 Saturation 61 %   Acid-Base Excess 4.0 (H) 0.0 - 2.0 mmol/L   Sodium 131 (L) 135 - 145 mmol/L   Potassium 4.0 3.5 - 5.1 mmol/L   Calcium, Ion 1.12 (L) 1.15 - 1.40 mmol/L   HCT 41.0 36.0 - 46.0 %   Hemoglobin 13.9 12.0 - 15.0 g/dL   Sample type VENOUS     Comment NOTIFIED PHYSICIAN   POC CBG, ED     Status: Abnormal   Collection Time: 09/09/21  3:57 PM  Result Value Ref Range   Glucose-Capillary 142 (H) 70 - 99 mg/dL    Comment: Glucose reference range applies only to samples taken after fasting for at least 8 hours.  CBG monitoring, ED     Status: Abnormal   Collection Time: 09/09/21  7:15 PM  Result Value Ref Range   Glucose-Capillary 106 (H) 70 - 99 mg/dL    Comment: Glucose reference range applies only to samples taken after fasting for at least 8 hours.   CT Head Wo Contrast  Result Date: 09/09/2021 CLINICAL DATA:  Mental status change, unknown cause EXAM: CT HEAD WITHOUT CONTRAST TECHNIQUE: Contiguous axial images were obtained from the base of the skull through the vertex without intravenous contrast. RADIATION DOSE REDUCTION: This exam was performed according to the departmental dose-optimization program which includes automated exposure control, adjustment of the mA and/or kV according to patient size and/or use of iterative reconstruction technique. COMPARISON:  Head CT 09/30/2020, brain MRI 06/22/2021 FINDINGS: Brain: No intracranial hemorrhage, mass effect, or midline shift. No evidence of intracranial mass on this unenhanced exam. No hydrocephalus. The basilar cisterns are patent. Mild periventricular chronic small vessel ischemia. No evidence of territorial infarct or acute ischemia. No extra-axial or intracranial fluid collection. Vascular: Atherosclerosis of skullbase vasculature without hyperdense vessel or abnormal calcification. Skull: No fracture or focal lesion. Sinuses/Orbits: Occasional opacification of ethmoid air cells. Trace opacification of mastoid air cells. No sinus fluid  levels. Other: None. IMPRESSION: 1. No acute intracranial abnormality. 2. Mild chronic small vessel ischemia. Electronically Signed   By: Keith Rake M.D.   On: 09/09/2021 18:46   DG Chest Portable 1 View  Result Date: 09/09/2021 CLINICAL DATA:   Shortness of breath EXAM: PORTABLE CHEST 1 VIEW COMPARISON:  Previous studies including the examination of 09/08/2021 FINDINGS: There is increased density in left lower lung field obscuring left hemidiaphragm. There are no signs of alveolar pulmonary edema in the parahilar regions. No new infiltrates are seen in right lung. IMPRESSION: There is interval increased opacity in the left lower lung fields suggesting increase in left pleural effusion and underlying atelectasis/pneumonia. Electronically Signed   By: Elmer Picker M.D.   On: 09/09/2021 15:42   US Thoracentesis Asp Pleural space w/IMG guide  Result Date: 09/08/2021 INDICATION: Patient with history of small cell lung cancer, dyspnea, left pleural effusion. Request received for diagnostic and therapeutic left thoracentesis. EXAM: ULTRASOUND GUIDED DIAGNOSTIC AND THERAPEUTIC LEFT THORACENTESIS MEDICATIONS: 8 mL 1% lidocaine COMPLICATIONS: None immediate. PROCEDURE: An ultrasound guided thoracentesis was thoroughly discussed with the patient and questions answered. The benefits, risks, alternatives and complications were also discussed. The patient understands and wishes to proceed with the procedure. Written consent was obtained. Ultrasound was performed to localize and mark an adequate pocket of fluid in the left chest. The area was then prepped and draped in the normal sterile fashion. 1% Lidocaine was used for local anesthesia. Under ultrasound guidance a 6 Fr Safe-T-Centesis catheter was introduced. Thoracentesis was performed. The catheter was removed and a dressing applied. FINDINGS: A total of approximately 750 cc of yellow fluid was removed. Samples were sent to the laboratory as requested by the clinical team. Due to pt coughing only the above amount of fluid was removed today. IMPRESSION: Successful ultrasound guided diagnostic and therapeutic left thoracentesis yielding 750 cc of pleural fluid. Read by: Rowe Robert, PA-C Electronically  Signed   By: Jerilynn Mages.  Shick M.D.   On: 09/08/2021 12:43   DG Chest 1 View  Result Date: 09/08/2021 CLINICAL DATA:  Status post thoracentesis. EXAM: CHEST  1 VIEW COMPARISON:  08/27/2021 FINDINGS: No evidence for pneumothorax. Left pleural effusion is decreased in the interval. Left mid lung and basilar atelectasis or scarring evident. Bones are diffusely demineralized. IMPRESSION: Interval decrease in left pleural effusion. No pneumothorax. Electronically Signed   By: Misty Stanley M.D.   On: 09/08/2021 11:48    Pending Labs Unresulted Labs (From admission, onward)     Start     Ordered   09/09/21 1914  Procalcitonin - Baseline  Add-on,   AD        09/09/21 1913   09/09/21 1550  Urinalysis, Routine w reflex microscopic  Once,   URGENT        09/09/21 1549   09/09/21 1517  Blood culture (routine x 2)  BLOOD CULTURE X 2,   R      09/09/21 1517            Vitals/Pain Today's Vitals   09/09/21 1755 09/09/21 1800 09/09/21 1859 09/09/21 1900  BP: 102/62 (!) 114/51 138/83 (!) 119/109  Pulse: 93 94 99 99  Resp: (!) 27 (!) 28 (!) 38 (!) 33  Temp:    99.5 F (37.5 C)  TempSrc:    Oral  SpO2: 100% 100% 100% 100%  Weight:      Height:      PainSc:        Isolation Precautions  No active isolations  Medications Medications  cefTRIAXone (ROCEPHIN) 1 g in sodium chloride 0.9 % 100 mL IVPB (1 g Intravenous New Bag/Given 09/09/21 1919)  azithromycin (ZITHROMAX) 500 mg in sodium chloride 0.9 % 250 mL IVPB (has no administration in time range)    Mobility non-ambulatory High fall risk   Focused Assessments Pulmonary Assessment Handoff:  Lung sounds: Bilateral Breath Sounds: Diminished L Breath Sounds: Diminished R Breath Sounds: Coarse crackles O2 Device: Nasal Cannula O2 Flow Rate (L/min): 4 L/min    R Recommendations: See Admitting Provider Note  Report given to:   Additional Notes:

## 2021-09-10 ENCOUNTER — Inpatient Hospital Stay (HOSPITAL_COMMUNITY): Payer: Medicare HMO

## 2021-09-10 DIAGNOSIS — R0602 Shortness of breath: Secondary | ICD-10-CM | POA: Diagnosis not present

## 2021-09-10 DIAGNOSIS — I480 Paroxysmal atrial fibrillation: Secondary | ICD-10-CM | POA: Diagnosis not present

## 2021-09-10 DIAGNOSIS — R471 Dysarthria and anarthria: Secondary | ICD-10-CM

## 2021-09-10 DIAGNOSIS — I10 Essential (primary) hypertension: Secondary | ICD-10-CM

## 2021-09-10 DIAGNOSIS — J90 Pleural effusion, not elsewhere classified: Secondary | ICD-10-CM | POA: Diagnosis not present

## 2021-09-10 DIAGNOSIS — R7881 Bacteremia: Secondary | ICD-10-CM

## 2021-09-10 DIAGNOSIS — J9621 Acute and chronic respiratory failure with hypoxia: Secondary | ICD-10-CM

## 2021-09-10 DIAGNOSIS — S22060A Wedge compression fracture of T7-T8 vertebra, initial encounter for closed fracture: Secondary | ICD-10-CM

## 2021-09-10 LAB — BLOOD CULTURE ID PANEL (REFLEXED) - BCID2

## 2021-09-10 LAB — COMPREHENSIVE METABOLIC PANEL
ALT: 47 U/L — ABNORMAL HIGH (ref 0–44)
AST: 41 U/L (ref 15–41)
Albumin: 2.8 g/dL — ABNORMAL LOW (ref 3.5–5.0)
Alkaline Phosphatase: 71 U/L (ref 38–126)
Anion gap: 14 (ref 5–15)
BUN: 25 mg/dL — ABNORMAL HIGH (ref 8–23)
CO2: 24 mmol/L (ref 22–32)
Calcium: 8.8 mg/dL — ABNORMAL LOW (ref 8.9–10.3)
Chloride: 93 mmol/L — ABNORMAL LOW (ref 98–111)
Creatinine, Ser: 1.03 mg/dL — ABNORMAL HIGH (ref 0.44–1.00)
GFR, Estimated: 58 mL/min — ABNORMAL LOW (ref >60)
Glucose, Bld: 111 mg/dL — ABNORMAL HIGH (ref 70–99)
Potassium: 3.4 mmol/L — ABNORMAL LOW (ref 3.5–5.1)
Sodium: 131 mmol/L — ABNORMAL LOW (ref 135–145)
Total Bilirubin: 0.3 mg/dL (ref 0.3–1.2)
Total Protein: 5.8 g/dL — ABNORMAL LOW (ref 6.5–8.1)

## 2021-09-10 LAB — PROCALCITONIN: Procalcitonin: 1.23 ng/mL

## 2021-09-10 LAB — CBC WITH DIFFERENTIAL/PLATELET
Abs Immature Granulocytes: 0.12 10*3/uL — ABNORMAL HIGH (ref 0.00–0.07)
Basophils Absolute: 0 10*3/uL (ref 0.0–0.1)
Basophils Relative: 0 %
Eosinophils Absolute: 0 10*3/uL (ref 0.0–0.5)
Eosinophils Relative: 0 %
HCT: 31.4 % — ABNORMAL LOW (ref 36.0–46.0)
Hemoglobin: 11.3 g/dL — ABNORMAL LOW (ref 12.0–15.0)
Immature Granulocytes: 1 %
Lymphocytes Relative: 1 %
Lymphs Abs: 0.2 10*3/uL — ABNORMAL LOW (ref 0.7–4.0)
MCH: 36.9 pg — ABNORMAL HIGH (ref 26.0–34.0)
MCHC: 36 g/dL (ref 30.0–36.0)
MCV: 102.6 fL — ABNORMAL HIGH (ref 80.0–100.0)
Monocytes Absolute: 0.8 10*3/uL (ref 0.1–1.0)
Monocytes Relative: 5 %
Neutro Abs: 15 10*3/uL — ABNORMAL HIGH (ref 1.7–7.7)
Neutrophils Relative %: 93 %
Platelets: 132 10*3/uL — ABNORMAL LOW (ref 150–400)
RBC: 3.06 MIL/uL — ABNORMAL LOW (ref 3.87–5.11)
RDW: 14.4 % (ref 11.5–15.5)
WBC: 16.2 10*3/uL — ABNORMAL HIGH (ref 4.0–10.5)
nRBC: 0 % (ref 0.0–0.2)

## 2021-09-10 LAB — MAGNESIUM: Magnesium: 1.6 mg/dL — ABNORMAL LOW (ref 1.7–2.4)

## 2021-09-10 MED ORDER — POTASSIUM CHLORIDE 10 MEQ/100ML IV SOLN
10.0000 meq | INTRAVENOUS | Status: AC
  Administered 2021-09-10 (×5): 10 meq via INTRAVENOUS
  Filled 2021-09-10 (×5): qty 100

## 2021-09-10 MED ORDER — SODIUM CHLORIDE 0.9 % IV SOLN
2.0000 g | Freq: Two times a day (BID) | INTRAVENOUS | Status: DC
  Administered 2021-09-10 – 2021-09-11 (×3): 2 g via INTRAVENOUS
  Filled 2021-09-10 (×3): qty 12.5

## 2021-09-10 MED ORDER — MAGNESIUM SULFATE 50 % IJ SOLN
3.0000 g | Freq: Once | INTRAVENOUS | Status: AC
  Administered 2021-09-10: 3 g via INTRAVENOUS
  Filled 2021-09-10: qty 6

## 2021-09-10 MED ORDER — LACTULOSE ENEMA
300.0000 mL | Freq: Once | ORAL | Status: AC
  Administered 2021-09-10: 300 mL via RECTAL
  Filled 2021-09-10: qty 300

## 2021-09-10 MED ORDER — SODIUM CHLORIDE 0.9 % IV SOLN: INTRAVENOUS | Status: DC | PRN

## 2021-09-10 MED ORDER — DIPHENHYDRAMINE HCL 25 MG PO CAPS
25.0000 mg | ORAL_CAPSULE | Freq: Once | ORAL | Status: DC | PRN
  Filled 2021-09-10: qty 1

## 2021-09-10 MED ORDER — IPRATROPIUM-ALBUTEROL 0.5-2.5 (3) MG/3ML IN SOLN
3.0000 mL | Freq: Four times a day (QID) | RESPIRATORY_TRACT | Status: DC
Start: 2021-09-10 — End: 2021-09-12
  Administered 2021-09-10 – 2021-09-12 (×6): 3 mL via RESPIRATORY_TRACT
  Filled 2021-09-10 (×6): qty 3

## 2021-09-10 NOTE — Progress Notes (Addendum)
PHARMACY - PHYSICIAN COMMUNICATION CRITICAL VALUE ALERT - BLOOD CULTURE IDENTIFICATION (BCID)  Patricia [name] is an 71 y.o. female who presented to [facility] on 09/09/2021 with a chief complaint of AMS, SOB, and hypoxia.   Assessment:  4/4 bottles positive for GNR unable to be identified by BCID. Patient is afebrile with a stable WBC and hemodynamics.   Name of physician (or Provider) Contacted:  Orlean Patten, MD. Relayed changes to provider via secure chat.   Current antibiotics: None, received 1x dose of ceftriaxone and azithromycin on 9/2   Changes to prescribed antibiotics recommended:  Per protocol added cefepime 2G Q12H  for unidentified GNR in blood culture.   Results for orders placed or performed during the hospital encounter of 09/09/21  Blood Culture ID Panel (Reflexed) (Collected: 09/09/2021  3:22 PM)  Result Value Ref Range   Enterococcus faecalis NOT DETECTED NOT DETECTED   Enterococcus Faecium NOT DETECTED NOT DETECTED   Listeria monocytogenes NOT DETECTED NOT DETECTED   Staphylococcus species NOT DETECTED NOT DETECTED   Staphylococcus aureus (BCID) NOT DETECTED NOT DETECTED   Staphylococcus epidermidis NOT DETECTED NOT DETECTED   Staphylococcus lugdunensis NOT DETECTED NOT DETECTED   Streptococcus species NOT DETECTED NOT DETECTED   Streptococcus agalactiae NOT DETECTED NOT DETECTED   Streptococcus pneumoniae NOT DETECTED NOT DETECTED   Streptococcus pyogenes NOT DETECTED NOT DETECTED   A.calcoaceticus-baumannii NOT DETECTED NOT DETECTED   Bacteroides fragilis NOT DETECTED NOT DETECTED   Enterobacterales NOT DETECTED NOT DETECTED   Enterobacter cloacae complex NOT DETECTED NOT DETECTED   Escherichia coli NOT DETECTED NOT DETECTED   Klebsiella aerogenes NOT DETECTED NOT DETECTED   Klebsiella oxytoca NOT DETECTED NOT DETECTED   Klebsiella pneumoniae NOT DETECTED NOT DETECTED   Proteus species NOT DETECTED NOT DETECTED   Salmonella species NOT DETECTED NOT DETECTED    Serratia marcescens NOT DETECTED NOT DETECTED   Haemophilus influenzae NOT DETECTED NOT DETECTED   Neisseria meningitidis NOT DETECTED NOT DETECTED   Pseudomonas aeruginosa NOT DETECTED NOT DETECTED   Stenotrophomonas maltophilia NOT DETECTED NOT DETECTED   Candida albicans NOT DETECTED NOT DETECTED   Candida auris NOT DETECTED NOT DETECTED   Candida glabrata NOT DETECTED NOT DETECTED   Candida krusei NOT DETECTED NOT DETECTED   Candida parapsilosis NOT DETECTED NOT DETECTED   Candida tropicalis NOT DETECTED NOT DETECTED   Cryptococcus neoformans/gattii NOT DETECTED NOT DETECTED    Ventura Sellers 09/10/2021  10:48 AM

## 2021-09-10 NOTE — Evaluation (Signed)
Clinical/Bedside Swallow Evaluation Patient Details  Name: Patricia [name] MRN: [id number] Date of Birth: March 21, 1950  Today's Date: 09/10/2021 Time: SLP Start Time (ACUTE ONLY): 3710 SLP Stop Time (ACUTE ONLY): 6269 SLP Time Calculation (min) (ACUTE ONLY): 18 min  Past Medical History:  Past Medical History:  Diagnosis Date   Anemia    Anxiety    GERD (gastroesophageal reflux disease)    Hyperlipidemia    Hypertension    IBS (irritable bowel syndrome)    Insomnia    Low back pain    Lung cancer ([place]) 10/2020   TIA (transient ischemic attack) 10/2017   no deficits   Vitamin B12 deficiency    Vitamin D deficiency    Past Surgical History:  Past Surgical History:  Procedure Laterality Date   BACK SURGERY     BREAST EXCISIONAL BIOPSY Left 1997   benign   BRONCHIAL BIOPSY  10/18/2020   Procedure: BRONCHIAL BIOPSIES;  Surgeon: [name], DO;  Location: [place] [facility];  Service: Pulmonary;;   BRONCHIAL BRUSHINGS  10/18/2020   Procedure: BRONCHIAL BRUSHINGS;  Surgeon: [name], DO;  Location: [place];  Service: Pulmonary;;   BRONCHIAL NEEDLE ASPIRATION BIOPSY  10/18/2020   Procedure: BRONCHIAL NEEDLE ASPIRATION BIOPSIES;  Surgeon: [name], DO;  Location: [place];  Service: Pulmonary;;   BRONCHIAL WASHINGS  10/18/2020   Procedure: BRONCHIAL WASHINGS;  Surgeon: [name], DO;  Location: [place];  Service: Pulmonary;;   CARDIOVERSION N/A 02/13/2021   Procedure: CARDIOVERSION;  Surgeon: Werner Lean, MD;  Location: [place];  Service: Cardiovascular;  Laterality: N/A;   CARDIOVERSION N/A 02/20/2021   Procedure: CARDIOVERSION;  Surgeon: Skeet Latch, MD;  Location: [place];  Service: Cardiovascular;  Laterality: N/A;   CARDIOVERSION N/A 03/22/2021   Procedure: CARDIOVERSION;  Surgeon: Freada Bergeron, MD;  Location: [facility];  Service: Cardiovascular;  Laterality: N/A;   hemorrhoidecotmy     HERNIA MESH REMOVAL      OPEN REDUCTION INTERNAL FIXATION (ORIF) DISTAL RADIAL FRACTURE Right 11/19/2019   Procedure: OPEN REDUCTION INTERNAL FIXATION (ORIF) DISTAL RADIUS AND ULNA FRACTURE WITH REPAIR AS NECESSARY;  Surgeon: Roseanne Kaufman, MD;  Location: [place];  Service: Orthopedics;  Laterality: Right;  2 hrs Block with IV sedation   ORIF RADIUS & ULNA FRACTURES     TONSILLECTOMY     VIDEO BRONCHOSCOPY WITH ENDOBRONCHIAL NAVIGATION Left 10/18/2020   Procedure: VIDEO BRONCHOSCOPY WITH ENDOBRONCHIAL NAVIGATION;  Surgeon: [name], DO;  Location: [place];  Service: Pulmonary;  Laterality: Left;  ION   VIDEO BRONCHOSCOPY WITH ENDOBRONCHIAL ULTRASOUND Bilateral 10/18/2020   Procedure: VIDEO BRONCHOSCOPY WITH ENDOBRONCHIAL ULTRASOUND;  Surgeon: [name], DO;  Location: [facility];  Service: Pulmonary;  Laterality: Bilateral;   VIDEO BRONCHOSCOPY WITH RADIAL ENDOBRONCHIAL ULTRASOUND  10/18/2020   Procedure: VIDEO BRONCHOSCOPY WITH RADIAL ENDOBRONCHIAL ULTRASOUND;  Surgeon: [name], DO;  Location: [facility];  Service: Pulmonary;;   HPI:  Pt is a 71 year old female who presented to the ED with altered mental status and worsening shortness of breath. CT chest 9/2: Moderate left pleural effusion with near complete consolidation of the left lower lobe and partial consolidation of the lingula similar to prior CT. Dysarthria with hypernasality noted by MD which is reportedly atypical. CT head negative & MRI results not available at time of evaluation. 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    Assessment / Plan /  Recommendation  Clinical Impression  Pt was seen for bedside swallow evaluation and she denied a history of dysphagia. Pt's daughter and husband reported that the pt's mentation was improved compared to when she was admitted. Pt reported that she does become dyspneic  during meals and that she typically takes a break if this occurs. Oral mechanism exam was [facility] and she presented with adequate, natural dentition. Pt tolerated all solids and liquids without signs or symptoms of oropharyngeal dysphagia. A regular texture diet with thin liquids is recommended at this time. Considering pt's acute worsening of respiratory status, SLP will follow pt briefly to ensure tolerance. SLP Visit Diagnosis: Dysphagia, unspecified (R13.10)    Aspiration Risk  Mild aspiration risk    Diet Recommendation Regular;Thin liquid   Liquid Administration via: Cup;Straw Medication Administration: Whole meds with liquid Supervision: Patient able to self feed Compensations: Slow rate (rest breaks if dyspneic) Postural Changes: Seated upright at 90 degrees    Other  Recommendations Oral Care Recommendations: Oral care BID    Recommendations for follow up therapy are one component of a multi-disciplinary discharge planning process, led by the attending physician.  Recommendations may be updated based on patient status, additional functional criteria and insurance authorization.  Follow up Recommendations No SLP follow up      Assistance Recommended at Discharge    Functional Status Assessment Patient has not had a recent decline in their functional status  Frequency and Duration min 1 x/week  1 week       Prognosis        Swallow Study   General Date of Onset: 09/09/21 HPI: Pt is a 71 year old female who presented to the ED with altered mental status and worsening shortness of breath. CT chest 9/2: Moderate left pleural effusion with near complete consolidation of the left lower lobe and partial consolidation of the lingula similar to prior CT. Dysarthria with hypernasality noted by MD which is reportedly atypical. CT head negative & MRI results not available at time of evaluation. 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 Type of Study: MBS-Modified Barium Swallow Study Previous Swallow Assessment: none Diet Prior to this Study: Regular;Thin liquids Temperature Spikes Noted: No Respiratory Status: Nasal cannula History of Recent Intubation: No Behavior/Cognition: Alert;Cooperative;Pleasant mood Oral Cavity Assessment: Within Functional Limits Oral Care Completed by SLP: No Oral Cavity - Dentition: Adequate natural dentition Vision: Functional for self-feeding Self-Feeding Abilities: Needs assist Patient Positioning: Upright in bed;Postural control adequate for testing Baseline Vocal Quality: Normal Volitional Swallow: Able to elicit    Oral/Motor/Sensory Function Overall Oral Motor/Sensory Function: Within functional limits   Ice Chips Ice chips: Within functional limits Presentation: Spoon   Thin Liquid Thin Liquid: Within functional limits Presentation: Straw;Cup    Nectar Thick Nectar Thick Liquid: Not tested   Honey Thick Honey Thick Liquid: Not tested   Puree Puree: Within functional limits Presentation: Spoon   Solid     Solid: Within functional limits Presentation: Neeses I. Hardin Negus, [place], [place] Office number [phone]  Horton Marshall 09/10/2021,1:04 PM

## 2021-09-10 NOTE — Progress Notes (Signed)
SLP Cancellation Note  Patient Details Name: Patricia [name] MRN: [id number] DOB: 02/04/1950   Cancelled treatment:       Reason Eval/Treat Not Completed: Patient at procedure or test/unavailable (Pt currently at MRI. SLP will follow up.)  Tylasia Fletchall I. Hardin Negus, [place], [place] Office number [phone]  Horton Marshall 09/10/2021, 8:38 AM

## 2021-09-10 NOTE — Progress Notes (Signed)
Patricia [name] SWF:[id number] DOB: 1950/02/05 DOA: 09/09/2021 PCP: [name], MD   Subj: 71 year old WF PMHx small cell lung cancer left side, A-fib, recent vertebral fracture awaiting kyphoplasty, pleural effusion S/P thoracentesis yesterday   Presents to the ER today with AMS and worsening SOB.  Family states that she underwent a 750 mL thoracentesis yesterday.  Documented that thoracentesis was aborted early due to the patient coughing.   Family states the patient went home went to sleep.  This morning there had difficulty waking her up.  Patient did take some extra sleeping medicine yesterday including Klonopin, Soma, Atarax.   Patient had a nasal quality to her voice today.  She was also altered.  She was also hypoxic with supplemental oxygen saturations of 85% at home.   Patient brought to the ER.   Arrival temp 98.5 heart rate 101 blood pressure 170/123 satting 94% on 4 L.   Labs showed a white count of 16.9, hemoglobin 13.5, platelets of 178.   Notable is her CBC from 5 days ago on 09/04/2021 showed a white count of 5.2, hemoglobin 10.6, platelets 135   CMP today showed a sodium 132, potassium 3.9, chloride of 94, BUN of 18, creatinine 0.98   Ammonia level normal at 19.   CT head showed no acute intracranial abnormality.  Chronic small vessel ischemic disease.   Chest x-ray showed personally viewed interpreted shows new left-sided pleural effusion.  Obj: 9/3 A/O x4 positive chronic respiratory failure with hypoxia on 3 L O2 via [place] at home.  States last chemo was in January, last radiation in February.  Per family appetite has been poor for some time.  Patient states on her left side several fractured ribs from a fall unsure exactly when because she has numerous falls.  In addition fractures in her spine.  States all of the structural issues are seriously inhibiting her mobility.  Also complains of acute on chronic constipation.  Requests enema    Objective: VITAL  SIGNS: Temp: 98.3 F (36.8 C) (09/03 1300) Temp Source: Oral (09/03 1300) BP: 103/62 (09/03 1300) Pulse Rate: 81 (09/03 1300) SPO2; 100% FIO2: 4 L   Intake/Output Summary (Last 24 hours) at 09/10/2021 1834 Last data filed at 09/10/2021 1821 Gross per 24 hour  Intake 1015.99 ml  Output 0 ml  Net 1015.99 ml     Exam: General: A/O x4 positive acute on chronic acute respiratory distress, cachectic Lungs: tachypnea, decreased breath sounds bilaterally LEFT>>> RIGHT, without wheezes or crackles Cardiovascular: Tachycardic, without murmur gallop or rub normal S1 and S2 Abdomen: Nontender, nondistended, soft, bowel sounds positive, no rebound, no ascites, no appreciable mass Extremities: No significant cyanosis, clubbing, or edema bilateral lower extremities Skin: Negative rashes, lesions, ulcers Psychiatric:  Negative depression, negative anxiety, negative fatigue, negative mania  Central nervous system:  Cranial nerves II through XII intact, tongue/uvula midline, all extremities muscle strength 5/5, sensation intact throughout, negative dysarthria, negative expressive aphasia, negative receptive aphasia.    Mobility Assessment (last 72 hours)     Mobility Assessment     Row Name 09/09/21 2300           Does patient have an order for bedrest or is patient medically unstable Yes- Bedfast (Level 1) - Complete                  DVT prophylaxis: Subcu heparin Code Status: DNR Family Communication: 9/3 husband and daughter at bedside for discussion of plan of care all questions answered Status is:  Inpatient    Dispo: The patient is from: Home              Anticipated d/c is to: Home              Anticipated d/c date is: > 3 days              Patient currently is not medically stable to d/c.    Procedures/Significant Events: 9/1 Thoracentesis   Consultants:     Cultures  8/31 MRSA by PCR negative 9/1 LEFT lung pleural fluid pending 9/2 blood LEFT AC positive  GNR 9/2 blood RIGHT AC positive GNR   Antimicrobials: Anti-infectives (From admission, onward)    Start     Ordered Stop   09/10/21 1145  ceFEPIme (MAXIPIME) 2 g in sodium chloride 0.9 % 100 mL IVPB        09/10/21 1052     09/09/21 1900  cefTRIAXone (ROCEPHIN) 1 g in sodium chloride 0.9 % 100 mL IVPB        09/09/21 1850 09/09/21 2106   09/09/21 1900  azithromycin (ZITHROMAX) 500 mg in sodium chloride 0.9 % 250 mL IVPB        09/09/21 1850 09/09/21 2112        A/P  Positive GNR Bacteremia -Continue current antibiotic.  Await speciation  Recurrent left pleural effusion -s/p left thoracentesis yesterday.  -cxr shows reaccumulation of her pleural effusion. Doubt hemothorax as pt's HgB is actually higher today than 5 days ago.  -Cytology pending on pleural fluid from yesterday.  -Obtain CT chest to evaluate.  Patient has no air movement in lower portion of left upper lobe, lingula, left lower lobe secondary to mass, pleural effusion, and radiation changes to lung tissue. -Unsure fluid was sent to lab for appropriate lab work-up.  Will need to call main lab in a.m. and speak with lab techs to determine where specimen was sent. 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  -Pt has not had any surgery.  -Oncology concerned about enlarged pulmonary lymph nodes. Possible metastasis to LN. -they wanted an outpatient PET/CT. Discussed with family that pt's pleural effusion could be malignant and that would mean that pt's lung cancer has returned.  -Awaiting pleural fluid cytology   Dysarthria -Initial head CT is negative.  -Pt stopped her Eliquis 48 hours ago in preparation for kyphoplasty for her vertebral fracture.  -Pt without any focal deficits now except for an exceedingly nasal-quality voice...something her family states is not normal for pt's speech pattern.  May need MRI brain  to further evaluate. Pt outside window for any intervention as her LKN was evening of 09-08-2021.   Fracture of seventh thoracic vertebra (HCC) -Pt has been awaiting kyphoplasty for 2-3 months now. Was scheduled for this week.  -Discussed with family that if pt has active infection with pneumonia, then most likely kyphoplasty would have to be canceled.   Chronic respiratory failure with hypoxia (3 L O2 ) -  AF (paroxysmal atrial fibrillation) (HCC) -Currently on subcu heparin which is not therapeutic for A-fib.  Will discuss with family and patient about restarting Eliquis.  Patient is not going to receive kyphoplasty given her multiple medical problems.   Essential hypertension -Toprol 25 mg daily   Constipation - 9/3 patient requests enema - 9/3 lactulose enema  Hypokalemia - Potassium goal> 4 - 9/3 Potassium IV 50 mEq  Hypomagnesmia - Magnesium goal> 2 - 9/3 Magnesium IV 3 g      Care during the described time interval was provided by me .  I have reviewed this patient's available data, including medical history, events of note, physical examination, and all test results as part of my evaluation.

## 2021-09-11 ENCOUNTER — Inpatient Hospital Stay (HOSPITAL_COMMUNITY): Payer: Medicare HMO

## 2021-09-11 DIAGNOSIS — I480 Paroxysmal atrial fibrillation: Secondary | ICD-10-CM | POA: Diagnosis not present

## 2021-09-11 DIAGNOSIS — J90 Pleural effusion, not elsewhere classified: Secondary | ICD-10-CM | POA: Diagnosis present

## 2021-09-11 DIAGNOSIS — K56609 Unspecified intestinal obstruction, unspecified as to partial versus complete obstruction: Secondary | ICD-10-CM

## 2021-09-11 DIAGNOSIS — R7881 Bacteremia: Secondary | ICD-10-CM | POA: Diagnosis not present

## 2021-09-11 DIAGNOSIS — R0602 Shortness of breath: Secondary | ICD-10-CM | POA: Diagnosis not present

## 2021-09-11 LAB — CBC WITH DIFFERENTIAL/PLATELET
Abs Immature Granulocytes: 0.09 10*3/uL — ABNORMAL HIGH (ref 0.00–0.07)
Basophils Absolute: 0 10*3/uL (ref 0.0–0.1)
Basophils Relative: 0 %
Eosinophils Absolute: 0 10*3/uL (ref 0.0–0.5)
Eosinophils Relative: 0 %
HCT: 28.4 % — ABNORMAL LOW (ref 36.0–46.0)
Hemoglobin: 10.2 g/dL — ABNORMAL LOW (ref 12.0–15.0)
Immature Granulocytes: 1 %
Lymphocytes Relative: 2 %
Lymphs Abs: 0.2 10*3/uL — ABNORMAL LOW (ref 0.7–4.0)
MCH: 37 pg — ABNORMAL HIGH (ref 26.0–34.0)
MCHC: 35.9 g/dL (ref 30.0–36.0)
MCV: 102.9 fL — ABNORMAL HIGH (ref 80.0–100.0)
Monocytes Absolute: 0.6 10*3/uL (ref 0.1–1.0)
Monocytes Relative: 5 %
Neutro Abs: 11.9 10*3/uL — ABNORMAL HIGH (ref 1.7–7.7)
Neutrophils Relative %: 92 %
Platelets: 122 10*3/uL — ABNORMAL LOW (ref 150–400)
RBC: 2.76 MIL/uL — ABNORMAL LOW (ref 3.87–5.11)
RDW: 14.4 % (ref 11.5–15.5)
WBC: 12.8 10*3/uL — ABNORMAL HIGH (ref 4.0–10.5)
nRBC: 0 % (ref 0.0–0.2)

## 2021-09-11 LAB — URINALYSIS, ROUTINE W REFLEX MICROSCOPIC
Bilirubin Urine: NEGATIVE
Glucose, UA: NEGATIVE mg/dL
Hgb urine dipstick: NEGATIVE
Ketones, ur: 5 mg/dL — AB
Leukocytes,Ua: NEGATIVE
Nitrite: NEGATIVE
Protein, ur: 30 mg/dL — AB
Specific Gravity, Urine: 1.033 — ABNORMAL HIGH (ref 1.005–1.030)
pH: 5 (ref 5.0–8.0)

## 2021-09-11 LAB — BLOOD GAS, ARTERIAL
Acid-Base Excess: 4.4 mmol/L — ABNORMAL HIGH (ref 0.0–2.0)
Bicarbonate: 29.2 mmol/L — ABNORMAL HIGH (ref 20.0–28.0)
Drawn by: 24686
O2 Saturation: 98.2 %
Patient temperature: 37
pCO2 arterial: 43 mmHg (ref 32–48)
pH, Arterial: 7.44 (ref 7.35–7.45)
pO2, Arterial: 81 mmHg — ABNORMAL LOW (ref 83–108)

## 2021-09-11 LAB — COMPREHENSIVE METABOLIC PANEL
ALT: 40 U/L (ref 0–44)
AST: 29 U/L (ref 15–41)
Albumin: 2.4 g/dL — ABNORMAL LOW (ref 3.5–5.0)
Alkaline Phosphatase: 71 U/L (ref 38–126)
Anion gap: 10 (ref 5–15)
BUN: 30 mg/dL — ABNORMAL HIGH (ref 8–23)
CO2: 24 mmol/L (ref 22–32)
Calcium: 8.6 mg/dL — ABNORMAL LOW (ref 8.9–10.3)
Chloride: 97 mmol/L — ABNORMAL LOW (ref 98–111)
Creatinine, Ser: 0.82 mg/dL (ref 0.44–1.00)
GFR, Estimated: >60 mL/min (ref >60)
Glucose, Bld: 97 mg/dL (ref 70–99)
Potassium: 4 mmol/L (ref 3.5–5.1)
Sodium: 131 mmol/L — ABNORMAL LOW (ref 135–145)
Total Bilirubin: 0.5 mg/dL (ref 0.3–1.2)
Total Protein: 5.5 g/dL — ABNORMAL LOW (ref 6.5–8.1)

## 2021-09-11 LAB — AMMONIA: Ammonia: 17 umol/L (ref 9–35)

## 2021-09-11 LAB — MAGNESIUM: Magnesium: 2.1 mg/dL (ref 1.7–2.4)

## 2021-09-11 LAB — LACTIC ACID, PLASMA
Lactic Acid, Venous: 1.5 mmol/L (ref 0.5–1.9)
Lactic Acid, Venous: 1.4 mmol/L (ref 0.5–1.9)

## 2021-09-11 LAB — PHOSPHORUS: Phosphorus: 3.2 mg/dL (ref 2.5–4.6)

## 2021-09-11 MED ORDER — PANTOPRAZOLE SODIUM 40 MG IV SOLR
40.0000 mg | Freq: Two times a day (BID) | INTRAVENOUS | Status: DC
  Administered 2021-09-11 – 2021-09-23 (×25): 40 mg via INTRAVENOUS
  Filled 2021-09-11 (×25): qty 10

## 2021-09-11 MED ORDER — DEXTROSE 5 % IV SOLN: 500.0000 mg | Freq: Three times a day (TID) | INTRAVENOUS | Status: DC | PRN

## 2021-09-11 MED ORDER — CHLORHEXIDINE GLUCONATE CLOTH 2 % EX PADS
6.0000 | MEDICATED_PAD | Freq: Every day | CUTANEOUS | Status: DC
  Administered 2021-09-13 – 2021-09-22 (×10): 6 via TOPICAL

## 2021-09-11 MED ORDER — MORPHINE SULFATE (PF) 2 MG/ML IV SOLN
1.0000 mg | INTRAVENOUS | Status: DC | PRN
  Administered 2021-09-12 (×2): 1 mg via INTRAVENOUS
  Administered 2021-09-12: 2 mg via INTRAVENOUS
  Administered 2021-09-12: 1 mg via INTRAVENOUS
  Administered 2021-09-13 – 2021-09-14 (×2): 2 mg via INTRAVENOUS
  Filled 2021-09-11 (×7): qty 1

## 2021-09-11 MED ORDER — LORAZEPAM 2 MG/ML IJ SOLN: 2.0000 mg | Freq: Once | INTRAMUSCULAR | Status: DC

## 2021-09-11 MED ORDER — ESCITALOPRAM OXALATE 10 MG PO TABS
10.0000 mg | ORAL_TABLET | Freq: Every day | ORAL | Status: DC
  Administered 2021-09-11 – 2021-09-22 (×12): 10 mg via ORAL
  Filled 2021-09-11 (×12): qty 1

## 2021-09-11 MED ORDER — LACTULOSE 10 GM/15ML PO SOLN
30.0000 g | Freq: Two times a day (BID) | ORAL | Status: DC
  Administered 2021-09-11: 30 g via ORAL
  Filled 2021-09-11: qty 45

## 2021-09-11 MED ORDER — LORAZEPAM 2 MG/ML IJ SOLN
2.0000 mg | Freq: Once | INTRAMUSCULAR | Status: AC
Start: 2021-09-11 — End: 2021-09-11
  Administered 2021-09-11: 2 mg via INTRAVENOUS
  Filled 2021-09-11: qty 1

## 2021-09-11 MED ORDER — AMPICILLIN-SULBACTAM SODIUM 3 (2-1) G IJ SOLR
3.0000 g | Freq: Four times a day (QID) | INTRAMUSCULAR | Status: DC
  Administered 2021-09-11 – 2021-09-22 (×44): 3 g via INTRAVENOUS
  Filled 2021-09-11 (×46): qty 8

## 2021-09-11 NOTE — Progress Notes (Addendum)
Patricia [name] SHF:[id number] DOB: 07-08-1950 DOA: 09/09/2021 PCP: [name], MD   Subj: 71 year old WF PMHx small cell lung cancer left side, A-fib, recent vertebral fracture awaiting kyphoplasty, pleural effusion S/P thoracentesis yesterday   Presents to the ER today with AMS and worsening SOB.  Family states that she underwent a 750 mL thoracentesis yesterday.  Documented that thoracentesis was aborted early due to the patient coughing.   Family states the patient went home went to sleep.  This morning there had difficulty waking her up.  Patient did take some extra sleeping medicine yesterday including Klonopin, Soma, Atarax.   Patient had a nasal quality to her voice today.  She was also altered.  She was also hypoxic with supplemental oxygen saturations of 85% at home.   Patient brought to the ER.   Arrival temp 98.5 heart rate 101 blood pressure 170/123 satting 94% on 4 L.   Labs showed a white count of 16.9, hemoglobin 13.5, platelets of 178.   Notable is her CBC from 5 days ago on 09/04/2021 showed a white count of 5.2, hemoglobin 10.6, platelets 135   CMP today showed a sodium 132, potassium 3.9, chloride of 94, BUN of 18, creatinine 0.98   Ammonia level normal at 19.   CT head showed no acute intracranial abnormality.  Chronic small vessel ischemic disease.   Chest x-ray showed personally viewed interpreted shows new left-sided pleural effusion.  Obj: 9/4 afebrile overnight A/O x4, however patient's short-term memory is starting to wax and wane this AM.  Certainly more confused than yesterday.      Objective: VITAL SIGNS: Temp: 98 F (36.7 C) (09/04 0721) Temp Source: Oral (09/04 0721) BP: 93/63 (09/04 0721) Pulse Rate: 80 (09/04 0721) SPO2; 100% FIO2: 4 L   Intake/Output Summary (Last 24 hours) at 09/11/2021 1746 Last data filed at 09/11/2021 1313 Gross per 24 hour  Intake 1018.99 ml  Output 725 ml  Net 293.99 ml     Exam: General: A/O x4 positive  acute on chronic acute respiratory distress, cachectic Lungs: tachypnea, decreased breath sounds bilaterally LEFT>>> RIGHT, without wheezes or crackles Cardiovascular: Tachycardic, without murmur gallop or rub normal S1 and S2 Abdomen: Nontender, nondistended, soft, bowel sounds positive, no rebound, no ascites, no appreciable mass Extremities: No significant cyanosis, clubbing, or edema bilateral lower extremities Skin: Negative rashes, lesions, ulcers Psychiatric:  Negative depression, negative anxiety, negative fatigue, negative mania  Central nervous system:  Cranial nerves II through XII intact, tongue/uvula midline, all extremities muscle strength 5/5, sensation intact throughout, negative dysarthria, negative expressive aphasia, negative receptive aphasia.    Mobility Assessment (last 72 hours)     Mobility Assessment     Row Name 09/10/21 2300 09/09/21 2300         Does patient have an order for bedrest or is patient medically unstable Yes- Bedfast (Level 1) - Complete Yes- Bedfast (Level 1) - Complete                 DVT prophylaxis: Subcu heparin Code Status: DNR Family Communication: 9/3 husband and daughter at bedside for discussion of plan of care all questions answered Status is: Inpatient    Dispo: The patient is from: Home              Anticipated d/c is to: Home              Anticipated d/c date is: > 3 days  Patient currently is not medically stable to d/c.    Procedures/Significant Events: 9/1 Thoracentesis: 9/4 spoke at length with lab and they never received sample for processing 9/4 DG CXR: There is interval complete opacification of left hemithorax suggesting atelectasis in left lung, possibly due to central obstructing mucous plug. Part of this opacity is due to left pleural effusion.  Small patchy infiltrate in right lower lung field may suggest atelectasis/pneumonia. There is no pneumothorax. 9/4 KUB There is increased amount  of gas which appears to be mostly in the colon suggesting possible ileus or distal colonic obstruction. There is no significant small bowel dilation. Consultants:     Cultures  8/31 MRSA by PCR negative 9/1 LEFT lung pleural fluid never sent per lab 9/2 blood LEFT AC Positive Pasteurella Multocida 9/2 blood RIGHT AC Positive Pasteurella Multocida    Antimicrobials: Anti-infectives (From admission, onward)    Start     Ordered Stop   09/11/21 1600  Ampicillin-Sulbactam (UNASYN) 3 g in sodium chloride 0.9 % 100 mL IVPB        09/11/21 1454     09/10/21 1145  ceFEPIme (MAXIPIME) 2 g in sodium chloride 0.9 % 100 mL IVPB  Status:  Discontinued        09/10/21 1052 09/11/21 1452   09/09/21 1900  cefTRIAXone (ROCEPHIN) 1 g in sodium chloride 0.9 % 100 mL IVPB        09/09/21 1850 09/09/21 2106   09/09/21 1900  azithromycin (ZITHROMAX) 500 mg in sodium chloride 0.9 % 250 mL IVPB        09/09/21 1850 09/09/21 2112              A/P  Positive Pasteurella Multocida Bacteremia -Continue current antibiotic.  Await speciation  Recurrent left pleural effusion -s/p left thoracentesis yesterday.  -cxr shows reaccumulation of her pleural effusion. Doubt hemothorax as pt's HgB is actually higher today than 5 days ago.  -Cytology pending on pleural fluid from yesterday.  -Obtain CT chest to evaluate.  Patient has no air movement in lower portion of left upper lobe, lingula, left lower lobe secondary to mass, pleural effusion, and radiation changes to lung tissue. -Unsure fluid was sent to lab for appropriate lab work-up.  Will need to call main lab in a.m. and speak with lab techs to determine where specimen was sent. -Flutter valve - Incentive spirometry - DuoNeb -Mucinex DM -9/4 review of CT chest 9/2 effusion appears loculated -9/4 LEFT thoracentesis pending -9/4 consult Dr. Leory Plowman Icard or on call PCCM in A.m. 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 Lorna Few oncology patient with recurrent cancer?   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  -Pt has not had any surgery.  -Oncology concerned about enlarged pulmonary lymph nodes. Possible metastasis to LN. -they wanted an outpatient PET/CT. Discussed with family that pt's pleural effusion could be malignant and that would mean that pt's lung cancer has returned.  - 9/4 CT recurrent pleural effusion   Dysarthria -Initial head CT is negative.  -Pt stopped her Eliquis 48 hours ago in preparation for kyphoplasty for her vertebral fracture.  -Pt without any focal deficits now except for an exceedingly nasal-quality voice...something her family states is not normal for pt's speech pattern.  May need MRI brain to further evaluate. Pt outside window for any intervention as her LKN was evening of 09-08-2021.  Fracture of seventh thoracic vertebra (HCC) -Pt has been awaiting kyphoplasty for 2-3 months now. Was scheduled for this week.  -Discussed with family that if pt has active infection with pneumonia, then most likely kyphoplasty would have to be canceled.   Acute on Chronic respiratory failure with hypoxia (3 L O2 ) - Secondary to recurrent pleural effusion most likely loculated, most likely malignant.  AF (paroxysmal atrial fibrillation) (HCC) -Currently on subcu heparin which is not therapeutic for A-fib.  Will discuss with family and patient about restarting Eliquis.  Patient is not going to receive kyphoplasty given her multiple medical problems.   Essential hypertension -Toprol 25 mg daily   Constipation - 9/3 patient requests enema - 9/3 lactulose enema -9/4 lactulose 30 g BID (hold) SBO  SBO/ileus - 9/4 KUB C/W ileus/SBO.  Place NG tube for decompression.  Hypokalemia - Potassium goal> 4 - 9/3 Potassium IV 50 mEq  Hypomagnesmia - Magnesium goal> 2 - 9/3 Magnesium IV 3  g        Care during the described time interval was provided by me .  I have reviewed this patient's available data, including medical history, events of note, physical examination, and all test results as part of my evaluation.

## 2021-09-11 NOTE — Progress Notes (Signed)
Pt presented to ED with altered mental status and worsening shortness of breath. X-ray showed pleural effusion. Pt from home. TOC will follow to assist with potential discharge needs.

## 2021-09-12 ENCOUNTER — Inpatient Hospital Stay (HOSPITAL_COMMUNITY): Payer: Medicare HMO

## 2021-09-12 DIAGNOSIS — R7881 Bacteremia: Secondary | ICD-10-CM | POA: Diagnosis not present

## 2021-09-12 DIAGNOSIS — J9611 Chronic respiratory failure with hypoxia: Secondary | ICD-10-CM

## 2021-09-12 DIAGNOSIS — J90 Pleural effusion, not elsewhere classified: Secondary | ICD-10-CM | POA: Diagnosis not present

## 2021-09-12 DIAGNOSIS — R0602 Shortness of breath: Secondary | ICD-10-CM | POA: Diagnosis not present

## 2021-09-12 DIAGNOSIS — I480 Paroxysmal atrial fibrillation: Secondary | ICD-10-CM | POA: Diagnosis not present

## 2021-09-12 LAB — CULTURE, BLOOD (ROUTINE X 2): Special Requests: ADEQUATE

## 2021-09-12 LAB — COMPREHENSIVE METABOLIC PANEL
ALT: 30 U/L (ref 0–44)
AST: 18 U/L (ref 15–41)
Albumin: 2.2 g/dL — ABNORMAL LOW (ref 3.5–5.0)
Alkaline Phosphatase: 77 U/L (ref 38–126)
Anion gap: 9 (ref 5–15)
BUN: 28 mg/dL — ABNORMAL HIGH (ref 8–23)
CO2: 25 mmol/L (ref 22–32)
Calcium: 8.5 mg/dL — ABNORMAL LOW (ref 8.9–10.3)
Chloride: 96 mmol/L — ABNORMAL LOW (ref 98–111)
Creatinine, Ser: 0.67 mg/dL (ref 0.44–1.00)
GFR, Estimated: >60 mL/min (ref >60)
Glucose, Bld: 113 mg/dL — ABNORMAL HIGH (ref 70–99)
Potassium: 4.1 mmol/L (ref 3.5–5.1)
Sodium: 130 mmol/L — ABNORMAL LOW (ref 135–145)
Total Bilirubin: 0.6 mg/dL (ref 0.3–1.2)
Total Protein: 5.6 g/dL — ABNORMAL LOW (ref 6.5–8.1)

## 2021-09-12 LAB — URINE CULTURE
Culture: NO GROWTH
Special Requests: NORMAL

## 2021-09-12 LAB — EXPECTORATED SPUTUM ASSESSMENT W GRAM STAIN, RFLX TO RESP C

## 2021-09-12 LAB — CBC WITH DIFFERENTIAL/PLATELET
Abs Immature Granulocytes: 0.29 10*3/uL — ABNORMAL HIGH (ref 0.00–0.07)
Basophils Absolute: 0 10*3/uL (ref 0.0–0.1)
Basophils Relative: 0 %
Eosinophils Absolute: 0 10*3/uL (ref 0.0–0.5)
Eosinophils Relative: 0 %
HCT: 28.7 % — ABNORMAL LOW (ref 36.0–46.0)
Hemoglobin: 10.3 g/dL — ABNORMAL LOW (ref 12.0–15.0)
Immature Granulocytes: 2 %
Lymphocytes Relative: 1 %
Lymphs Abs: 0.1 10*3/uL — ABNORMAL LOW (ref 0.7–4.0)
MCH: 36.8 pg — ABNORMAL HIGH (ref 26.0–34.0)
MCHC: 35.9 g/dL (ref 30.0–36.0)
MCV: 102.5 fL — ABNORMAL HIGH (ref 80.0–100.0)
Monocytes Absolute: 0.8 10*3/uL (ref 0.1–1.0)
Monocytes Relative: 6 %
Neutro Abs: 13.1 10*3/uL — ABNORMAL HIGH (ref 1.7–7.7)
Neutrophils Relative %: 91 %
Platelets: 128 10*3/uL — ABNORMAL LOW (ref 150–400)
RBC: 2.8 MIL/uL — ABNORMAL LOW (ref 3.87–5.11)
RDW: 14.2 % (ref 11.5–15.5)
WBC: 14.3 10*3/uL — ABNORMAL HIGH (ref 4.0–10.5)
nRBC: 0 % (ref 0.0–0.2)

## 2021-09-12 LAB — BODY FLUID CELL COUNT WITH DIFFERENTIAL
Eos, Fluid: 0 %
Lymphs, Fluid: 3 %
Monocyte-Macrophage-Serous Fluid: 1 % — ABNORMAL LOW (ref 50–90)
Neutrophil Count, Fluid: 96 % — ABNORMAL HIGH (ref 0–25)
Total Nucleated Cell Count, Fluid: 2650 cu mm — ABNORMAL HIGH (ref 0–1000)

## 2021-09-12 LAB — PHOSPHORUS: Phosphorus: 2.7 mg/dL (ref 2.5–4.6)

## 2021-09-12 LAB — LACTATE DEHYDROGENASE, PLEURAL OR PERITONEAL FLUID: LD, Fluid: 942 U/L — ABNORMAL HIGH (ref 3–23)

## 2021-09-12 LAB — CYTOLOGY - NON PAP

## 2021-09-12 LAB — GLUCOSE, PLEURAL OR PERITONEAL FLUID: Glucose, Fluid: <20 mg/dL

## 2021-09-12 LAB — MAGNESIUM: Magnesium: 1.8 mg/dL (ref 1.7–2.4)

## 2021-09-12 LAB — PROTEIN, PLEURAL OR PERITONEAL FLUID: Total protein, fluid: 3.6 g/dL

## 2021-09-12 MED ORDER — IPRATROPIUM-ALBUTEROL 0.5-2.5 (3) MG/3ML IN SOLN
3.0000 mL | Freq: Two times a day (BID) | RESPIRATORY_TRACT | Status: DC
  Administered 2021-09-12 – 2021-09-23 (×22): 3 mL via RESPIRATORY_TRACT
  Filled 2021-09-12 (×24): qty 3

## 2021-09-12 MED ORDER — SODIUM CHLORIDE 3 % IN NEBU
4.0000 mL | INHALATION_SOLUTION | Freq: Two times a day (BID) | RESPIRATORY_TRACT | Status: AC
  Administered 2021-09-13 – 2021-09-15 (×5): 4 mL via RESPIRATORY_TRACT
  Filled 2021-09-12 (×8): qty 4

## 2021-09-12 MED ORDER — LIDOCAINE HCL 1 % IJ SOLN
INTRAMUSCULAR | Status: AC
  Filled 2021-09-12: qty 20

## 2021-09-12 MED ORDER — ALBUTEROL SULFATE (2.5 MG/3ML) 0.083% IN NEBU
2.5000 mg | INHALATION_SOLUTION | RESPIRATORY_TRACT | Status: DC | PRN
  Administered 2021-09-17 – 2021-09-23 (×9): 2.5 mg via RESPIRATORY_TRACT
  Filled 2021-09-12 (×10): qty 3

## 2021-09-12 NOTE — Progress Notes (Signed)
Pt tolerated NG tube overnight in low intermittent suction with less than 100 ml gastric secretion. Patient is alert and oriented X4. Pt pulled her NG tube due discomfort around her nose. Education given to PT and prepare to re-insert new NG tube however patient refuse at this time.

## 2021-09-12 NOTE — Procedures (Signed)
Thoracentesis  Procedure Note  Patricia [name]  [id number]  03-14-1950  Date:09/12/21  Time:3:35 PM   Provider Performing:Helana Macbride [name]   Procedure: Thoracentesis with imaging guidance (06237)  Indication(s) Pleural Effusion  Consent Risks of the procedure as well as the alternatives and risks of each were explained to the patient and/or caregiver.  Consent for the procedure was obtained and is signed in the bedside chart  Anesthesia Topical only with 1% lidocaine    Time Out Verified patient identification, verified procedure, site/side was marked, verified correct patient position, special equipment/implants available, medications/allergies/relevant history reviewed, required imaging and test results available.   Sterile Technique Maximal sterile technique including full sterile barrier drape, hand hygiene, sterile gown, sterile gloves, mask, hair covering, sterile ultrasound probe cover (if used).  Procedure Description Ultrasound was used to identify appropriate pleural anatomy for placement and overlying skin marked.  Area of drainage cleaned and draped in sterile fashion. Lidocaine was used to anesthetize the skin and subcutaneous tissue.  200 cc's of yellow/ hemorrhagic appearing fluid was drained from the left pleural space. Catheter then removed and bandaid applied to site.   Complications/Tolerance None; patient tolerated the procedure well. Chest X-ray is ordered to confirm no post-procedural complication.   EBL Minimal   Specimen(s) Pleural fluid  Patricia [name] V. Patricia Soho MD

## 2021-09-12 NOTE — Consult Note (Signed)
NAME:  Patricia [name], MRN:  [id number], DOB:  02-13-1950, LOS: 3 ADMISSION DATE:  09/09/2021, CONSULTATION DATE:  09/12/21 REFERRING MD:  Sherral Hammers, CHIEF COMPLAINT:  pleural effusion   History of Present Illness:  Patricia [name] is a 71 y.o. 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  CT chest was done which showed L pleural effusion and enlarged subcarinal lymph node.  She saw her oncologist Dr. Earlie Server on 8/29 and was sent for thoracentesis on 9/1.  750cc straw colored fluid withdrawn before severe coughing aborted procedure.  The next day on 9/2 she presented with worsened mental status and hypoxia to 85% on baseline home 3L O2.   CXR showed re-accumulation of L pleural effusion, WBC elevated to 16k and she was admitted to the hospitalists service.  Repeat CT chest with repeated L pleural effusion and lingula consolidation.  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   IR was consulted for repeat thora, however noted densely loculated effusions with septations so opted not to proceed.   PCCM consulted for possible chest tube.  Cytology pending from prior thoracentesis.  Pt reports feeling "terrible" but is on 4L and states her breathing is about the same.  She has pain in her ribs and back with coughing, feels weak and nauseated.  She states that if her cancer has returned, she would not be interested in pursuing further chemo/radiation.  Pertinent  Medical History   has a past medical history of Anemia, Anxiety, GERD (gastroesophageal reflux disease), Hyperlipidemia, Hypertension, IBS (irritable bowel syndrome), Insomnia, Low back pain, Lung cancer ([place]) (10/2020), TIA (transient  ischemic attack) (10/2017), Vitamin B12 deficiency, and Vitamin D deficiency. Small cell lung cancer    Significant Hospital Events: Including procedures, antibiotic start and stop dates in addition to other pertinent events   9/1 outpatient thoracentesis by IR 9/2 presented to the ED with AMS and dyspnea 9/5 PCCM consult  Interim History / Subjective:  As above  Objective   Blood pressure 134/79, pulse (!) 104, temperature 99 F (37.2 C), resp. rate 20, height 5\' 7"  (1.702 m), weight 44 kg, SpO2 98 %.    FiO2 (%):  [28 %] 28 %   Intake/Output Summary (Last 24 hours) at 09/12/2021 0929 Last data filed at 09/12/2021 0400 Gross per 24 hour  Intake 340.28 ml  Output 200 ml  Net 140.28 ml   Filed Weights   09/10/21 0500 09/11/21 0527 09/12/21 0433  Weight: 66.5 kg 44.5 kg 44 kg   General:  chronically  ill-appearing F, resting in bed, appears uncomfortable HEENT: MM pink/moist, sclera anicteric Neuro: awake, alert, oriented and non-focal CV: s1s2 rrr, no m/r/g PULM:  decreased air entry LLL, no wheezing or rhonchi, no distress on 4L [place] GI: soft, mildly distended, non-tender Extremities: warm/dry, no edema  Skin: no rashes or lesions   Resolved Hospital Problem list     Assessment & Plan:    L pleural effusion and likely pneumonia in the setting of prior small cell lung Ca (2022) treated with chemo/radiation with enlarged subcarinal lymph node  Pasturella Multocida bacteremia  Consider hemothorax in the setting of recent thoracentesis, pt has been off Eliquis for the last 5 days -called pathology and  pleural fluid was just processed today, only cytology ordered, will see if additional pleural fluid studies can be added  -Pt has stated she would not seek further chemo and radiation if her lung cancer has re-occurred  -agree with Unasyn for pasturella bacteremia and likely pneumonia -diagnostic thoracentesis today, if hemothorax or complicated fluid may need chest  tube   Best Practice (right click and "Reselect all SmartList Selections" daily)   Per primary  Labs   CBC: Recent Labs  Lab 09/09/21 1515 09/09/21 1524 09/10/21 0242 09/11/21 0229 09/12/21 0254  WBC 16.9*  --  16.2* 12.8* 14.3*  NEUTROABS 16.2*  --  15.0* 11.9* 13.1*  HGB 13.6 13.9 11.3* 10.2* 10.3*  HCT 38.5 41.0 31.4* 28.4* 28.7*  MCV 105.2*  --  102.6* 102.9* 102.5*  PLT 178  --  132* 122* 128*    Basic Metabolic Panel: Recent Labs  Lab 09/09/21 1515 09/09/21 1524 09/10/21 0242 09/11/21 0229 09/12/21 0254  NA 132* 131* 131* 131* 130*  K 3.9 4.0 3.4* 4.0 4.1  CL 94*  --  93* 97* 96*  CO2 26  --  24 24 25   GLUCOSE 145*  --  111* 97 113*  BUN 18  --  25* 30* 28*  CREATININE 0.96  --  1.03* 0.82 0.67  CALCIUM 9.1  --  8.8* 8.6* 8.5*  MG 1.7  --  1.6* 2.1 1.8  PHOS  --   --   --  3.2 2.7   GFR: Estimated Creatinine Clearance: 44.2 mL/min (by C-G formula based on SCr of 0.67 mg/dL). Recent Labs  Lab 09/09/21 1515 09/10/21 0242 09/11/21 0229 09/11/21 1638 09/11/21 1821 09/12/21 0254  PROCALCITON 1.23  --   --   --   --   --   WBC 16.9* 16.2* 12.8*  --   --  14.3*  LATICACIDVEN  --   --   --  1.5 1.4  --     Liver Function Tests: Recent Labs  Lab 09/09/21 1515 09/10/21 0242 09/11/21 0229 09/12/21 0254  AST 33 41 29 18  ALT 44 47* 40 30  ALKPHOS 96 71 71 77  BILITOT 0.5 0.3 0.5 0.6  PROT 6.8 5.8* 5.5* 5.6*  ALBUMIN 3.4* 2.8* 2.4* 2.2*   No results for input(s): "LIPASE", "AMYLASE" in the last 168 hours. Recent Labs  Lab 09/09/21 1520 09/11/21 1638  AMMONIA 19 17    ABG    Component Value Date/Time   PHART 7.44 09/11/2021 1630   PCO2ART 43 09/11/2021 1630   PO2ART 81 (L) 09/11/2021 1630   HCO3 29.2 (H) 09/11/2021 1630   TCO2 32 09/09/2021 1524   O2SAT 98.2 09/11/2021 1630     Coagulation Profile: No results for input(s): "INR", "PROTIME" in the last 168 hours.  Cardiac Enzymes: No results for input(s): "CKTOTAL", "CKMB",  "CKMBINDEX", "TROPONINI" in the last 168 hours.  HbA1C: No results found for: "HGBA1C"  CBG: Recent Labs  Lab 09/09/21 1557 09/09/21 1915  GLUCAP 142* 106*    Review of Systems:   Please see the history of present illness. All other systems reviewed and are negative    Past Medical History:  She,  has a past medical history of Anemia, Anxiety, GERD (gastroesophageal reflux disease), Hyperlipidemia, Hypertension, IBS (irritable bowel syndrome), Insomnia, Low back pain, Lung cancer ([place]) (10/2020), TIA (transient ischemic attack) (10/2017), Vitamin B12 deficiency, and Vitamin D deficiency.   Surgical History:   Past Surgical History:  Procedure Laterality Date  BACK SURGERY     BREAST EXCISIONAL BIOPSY Left 1997   benign   BRONCHIAL BIOPSY  10/18/2020   Procedure: BRONCHIAL BIOPSIES;  Surgeon: [name], DO;  Location: [facility];  Service: Pulmonary;;   BRONCHIAL BRUSHINGS  10/18/2020   Procedure: BRONCHIAL BRUSHINGS;  Surgeon: [name], DO;  Location: [place];  Service: Pulmonary;;   BRONCHIAL NEEDLE ASPIRATION BIOPSY  10/18/2020   Procedure: BRONCHIAL NEEDLE ASPIRATION BIOPSIES;  Surgeon: [name], DO;  Location: [place];  Service: Pulmonary;;   BRONCHIAL WASHINGS  10/18/2020   Procedure: BRONCHIAL WASHINGS;  Surgeon: [name], DO;  Location: [place];  Service: Pulmonary;;   CARDIOVERSION N/A 02/13/2021   Procedure: CARDIOVERSION;  Surgeon: Werner Lean, MD;  Location: [place];  Service: Cardiovascular;  Laterality: N/A;   CARDIOVERSION N/A 02/20/2021   Procedure: CARDIOVERSION;  Surgeon: Skeet Latch, MD;  Location: [place];  Service: Cardiovascular;  Laterality: N/A;   CARDIOVERSION N/A 03/22/2021   Procedure: CARDIOVERSION;  Surgeon: Freada Bergeron, MD;  Location: [facility];  Service: Cardiovascular;  Laterality: N/A;   hemorrhoidecotmy     HERNIA MESH REMOVAL     OPEN REDUCTION INTERNAL  FIXATION (ORIF) DISTAL RADIAL FRACTURE Right 11/19/2019   Procedure: OPEN REDUCTION INTERNAL FIXATION (ORIF) DISTAL RADIUS AND ULNA FRACTURE WITH REPAIR AS NECESSARY;  Surgeon: Roseanne Kaufman, MD;  Location: [place];  Service: Orthopedics;  Laterality: Right;  2 hrs Block with IV sedation   ORIF RADIUS & ULNA FRACTURES     TONSILLECTOMY     VIDEO BRONCHOSCOPY WITH ENDOBRONCHIAL NAVIGATION Left 10/18/2020   Procedure: VIDEO BRONCHOSCOPY WITH ENDOBRONCHIAL NAVIGATION;  Surgeon: [name], DO;  Location: [place];  Service: Pulmonary;  Laterality: Left;  ION   VIDEO BRONCHOSCOPY WITH ENDOBRONCHIAL ULTRASOUND Bilateral 10/18/2020   Procedure: VIDEO BRONCHOSCOPY WITH ENDOBRONCHIAL ULTRASOUND;  Surgeon: [name], DO;  Location: [place];  Service: Pulmonary;  Laterality: Bilateral;   VIDEO BRONCHOSCOPY WITH RADIAL ENDOBRONCHIAL ULTRASOUND  10/18/2020   Procedure: VIDEO BRONCHOSCOPY WITH RADIAL ENDOBRONCHIAL ULTRASOUND;  Surgeon: [name], DO;  Location: [place] [facility];  Service: Pulmonary;;     Social History:   reports that she quit smoking about 20 months ago. Her smoking use included cigarettes. She started smoking about 51 years ago. She has a 50.00 pack-year smoking history. She has never used smokeless tobacco. She reports that she does not currently use alcohol. She reports that she does not use drugs.   Family History:  Her family history includes Breast cancer in her maternal aunt, paternal aunt, and sister.   Allergies Allergies  Allergen Reactions   Amoxicillin-Pot Clavulanate Nausea Only and Other (See Comments)    GI upset    Doxycycline Swelling    Oral swelling   Levaquin [Levofloxacin In D5w] Other (See Comments)    Joint problems    Magnesium Hydroxide Other (See Comments)    Welts    Oxycodone Nausea Only   Other Itching    Paper tape     Home Medications  Prior to Admission medications   Medication Sig Start Date End Date Taking?  Authorizing Provider  acetaminophen (TYLENOL) 325 MG tablet Take 650 mg by mouth every 6 (six) hours as needed for headache or moderate pain.   Yes [provider]  albuterol (VENTOLIN HFA) 108 (90 Base) MCG/ACT inhaler Inhale 2 puffs into the lungs every 6 (six) hours as needed for shortness of breath. 03/04/21  Yes [provider]  amiodarone (PACERONE) 200 MG tablet Take 1 tablet (200 mg total) by mouth daily. 04/24/21  Yes Camnitz, Will Hassell Done, MD  apixaban (ELIQUIS) 5 MG TABS tablet TAKE 1 TABLET(5 MG) BY MOUTH TWICE DAILY 06/12/21  Yes Crenshaw, Denice Bors, MD  Black Cohosh (REMIFEMIN PO) Take 1 tablet by mouth 2 (two) times daily.   Yes [provider]  carboxymethylcellulose (REFRESH PLUS) 0.5 % SOLN Place 1 drop into both eyes 3 (three) times daily as needed (for dryness).    Yes [provider]  carisoprodol (SOMA) 350 MG tablet Take 350 mg by mouth 3 (three) times daily as needed for muscle spasms.   Yes [provider]  Cholecalciferol (VITAMIN D3) 25 MCG (1000 UT) CHEW Chew 1 tablet by mouth daily.   Yes [provider]  clobetasol ointment (TEMOVATE) 2.95 % Apply 1 application. topically daily as needed (vaginal irritation).   Yes [provider]  clonazePAM (KLONOPIN) 1 MG tablet Take 1 mg by mouth at bedtime.  10/15/17  Yes [provider]  Coenzyme Q10 200 MG capsule Take 400 mg by mouth daily.   Yes [provider]  Cyanocobalamin (B-12 PO) Take 2 capsules by mouth daily.   Yes [provider]  docusate sodium (COLACE) 100 MG capsule Take 1 capsule (100 mg total) by mouth 2 (two) times daily. Patient taking differently: Take 300 mg by mouth daily. 01/03/21  Yes Pokhrel, Laxman, MD  escitalopram (LEXAPRO) 20 MG tablet Take 20 mg by mouth daily. 08/15/21  Yes [provider]  fluticasone (FLONASE) 50 MCG/ACT nasal spray Place 2 sprays into both nostrils daily as needed for allergies. 10/11/17  Yes  [provider]  furosemide (LASIX) 20 MG tablet Take 20 mg by mouth daily as needed for fluid.   Yes [provider]  gabapentin (NEURONTIN) 600 MG tablet Take 600 mg by mouth 2 (two) times daily.   Yes [provider]  guaiFENesin (MUCINEX) 600 MG 12 hr tablet Take 600 mg by mouth daily.   Yes [provider]  HYDROcodone-acetaminophen (NORCO) 10-325 MG tablet Take 1 tablet by mouth every 6 (six) hours as needed for pain. 08/24/21  Yes [provider]  hydrOXYzine (ATARAX) 25 MG tablet Take 25 mg by mouth at bedtime as needed for sleep or anxiety. 03/05/21  Yes [provider]  ipratropium (ATROVENT) 0.02 % nebulizer solution Take 0.5 mg by nebulization 3 (three) times daily as needed for shortness of breath or wheezing. 01/05/21  Yes [provider]  levalbuterol (XOPENEX) 1.25 MG/3ML nebulizer solution Inhale 1.25 mg into the lungs every 8 (eight) hours as needed for shortness of breath or wheezing. 01/10/21  Yes [provider]  memantine (NAMENDA) 10 MG tablet Take 1 tablet (10 mg total) by mouth 2 (two) times daily. 02/13/21  Yes Hayden Pedro, PA-C  metoprolol succinate (TOPROL-XL) 25 MG 24 hr tablet Take 12.5 mg by mouth daily. 06/23/21  Yes [provider]  Multiple Vitamin (MULTIVITAMIN WITH MINERALS) TABS tablet Take 1 tablet by mouth daily. 07/10/21  Yes Sheikh, Omair Latif, DO  omeprazole (PRILOSEC) 20 MG capsule Take 20 mg by mouth daily before breakfast.  08/27/17  Yes [provider]  Polyethyl Glycol-Propyl Glycol (SYSTANE) 0.4-0.3 % GEL ophthalmic gel Place 1 Application into both eyes daily as needed (irritation).   Yes [provider]  polyethylene glycol (MIRALAX / GLYCOLAX) packet Take 17 g by mouth in the morning.   Yes [provider]  promethazine (PHENERGAN) 25 MG tablet Take 25 mg by mouth every 6 (six) hours as needed for nausea or vomiting.   Yes [provider]  PROTEIN PO Take 237 mLs by mouth daily.   Yes [provider]  rosuvastatin (CRESTOR) 5 MG tablet Take 5 mg by mouth daily.   Yes [provider]  sodium chloride 1 g tablet Take 1 g by mouth 2 (two) times daily. 06/23/21  Yes [provider]  escitalopram (LEXAPRO) 10 MG tablet Take 1 tablet (10 mg total) by mouth daily. Patient not taking: Reported on 09/05/2021 01/04/21   Flora Lipps, MD  feeding supplement (ENSURE ENLIVE / ENSURE PLUS) LIQD Take 237 mLs by mouth daily. Patient not taking: Reported on 09/09/2021 07/09/21   Raiford Noble Latif, DO  furosemide (LASIX) 20 MG tablet Take 1 tablet (20 mg total) by mouth daily. Take only if weight goes above 180 lbs. Patient taking differently: Take 20 mg by mouth daily as needed for fluid (weight gain of 3 - 4 lbs overnight). 02/21/21 09/05/21  Arrien, Jimmy Picket, MD  guaiFENesin-dextromethorphan (ROBITUSSIN DM) 100-10 MG/5ML syrup Take 10 mLs by mouth every 4 (four) hours as needed for cough. Patient not taking: Reported on 09/05/2021 01/03/21   Pokhrel, Corrie Mckusick, MD  lidocaine (LIDODERM) 5 % Place 1 patch onto the skin daily. Remove & Discard patch within 12 hours or as directed by MD Patient not taking: Reported on 09/05/2021 07/10/21   Raiford Noble Latif, DO  lidocaine (XYLOCAINE) 2 % solution Use as directed 15 mLs in the mouth or throat every 6 (six) hours as needed for mouth pain. Patient not taking: Reported on 07/06/2021 12/18/20   Sherwood Gambler, MD  linaclotide [facility]) 145 MCG CAPS capsule Take 1 capsule (145 mcg total) by mouth daily at 4 PM. Patient not taking: Reported on 09/09/2021 10/03/20   Debbe Odea, MD  magic mouthwash SOLN Take 5 mLs by mouth 4 (four) times daily as needed for mouth pain. Pt is allergic to Magnesium Patient not taking: Reported on 07/06/2021 11/04/20   Sherol Dade E, PA-C  magnesium oxide (MAG-OX) 400 (240 Mg) MG tablet Take 1 tablet (400 mg total) by mouth daily. Patient  not taking: Reported on 07/06/2021 11/08/20   Heilingoetter, Cassandra L, PA-C  nystatin (MYCOSTATIN) 100000 UNIT/ML suspension Take 5 mLs (500,000 Units total) by mouth 4 (four) times daily. Patient not taking: Reported on 07/06/2021 12/16/20   Tyler Pita, MD  prochlorperazine (COMPAZINE) 10 MG tablet Take 1 tablet (10 mg total) by mouth every 6 (six) hours as needed for nausea or vomiting. Patient not taking: Reported on 09/09/2021 10/27/20   Curt Bears, MD  protein supplement (RESOURCE BENEPROTEIN) 6 g POWD Take 1 Scoop (6 g total) by mouth 3 (three) times daily with meals. Patient not taking: Reported on 09/05/2021 07/09/21   Raiford Noble Latif, DO  traMADol (ULTRAM) 50 MG tablet Take 50 mg by mouth 3 (three) times daily as needed for severe pain. 10/08/19   [provider]     Critical care time: n/a     Otilio Carpen Jalisia Puchalski, PA-C [facility] See Amion for pager If no response to pager , please call [phone] until 7pm After 7:00 pm call Elink  [phone]

## 2021-09-12 NOTE — Progress Notes (Signed)
Assisted Dr Elsworth Soho with bedside thoracentesis.  Patient tolerated well.  Awaiting PCXR.

## 2021-09-12 NOTE — Progress Notes (Addendum)
Lab called with critical fluid glucose result. RN notified MD. Patient continues to refuse NG tube. Patient and daughters educated about need for NG tube. Daughter stated, "She would just pull it out again." MD aware.

## 2021-09-12 NOTE — Progress Notes (Signed)
Patricia [name] QQI:[id number] DOB: 1950/11/15 DOA: 09/09/2021 PCP: [name], MD   Subj: 71 year old WF PMHx small cell lung cancer left side, A-fib, recent vertebral fracture awaiting kyphoplasty, pleural effusion S/P thoracentesis yesterday   Presents to the ER today with AMS and worsening SOB.  Family states that she underwent a 750 mL thoracentesis yesterday.  Documented that thoracentesis was aborted early due to the patient coughing.   Family states the patient went home went to sleep.  This morning there had difficulty waking her up.  Patient did take some extra sleeping medicine yesterday including Klonopin, Soma, Atarax.   Patient had a nasal quality to her voice today.  She was also altered.  She was also hypoxic with supplemental oxygen saturations of 85% at home.   Patient brought to the ER.   Arrival temp 98.5 heart rate 101 blood pressure 170/123 satting 94% on 4 L.   Labs showed a white count of 16.9, hemoglobin 13.5, platelets of 178.   Notable is her CBC from 5 days ago on 09/04/2021 showed a white count of 5.2, hemoglobin 10.6, platelets 135   CMP today showed a sodium 132, potassium 3.9, chloride of 94, BUN of 18, creatinine 0.98   Ammonia level normal at 19.   CT head showed no acute intracranial abnormality.  Chronic small vessel ischemic disease.   Chest x-ray showed personally viewed interpreted shows new left-sided pleural effusion.  Obj: 9/5 afebrile overnight afebrile overnight.  RN reported that patient pulled her NG tube~0530     Objective: VITAL SIGNS: BP: 124/68 (09/05 0433) Pulse Rate: 99 (09/05 0433) SPO2; 92% FIO2: 4 L   Intake/Output Summary (Last 24 hours) at 09/12/2021 9417 Last data filed at 09/12/2021 0400 Gross per 24 hour  Intake 340.28 ml  Output 200 ml  Net 140.28 ml      Exam: General: A/O x4 positive acute on chronic acute respiratory distress, cachectic Lungs: tachypnea, decreased breath sounds bilaterally LEFT>>>  RIGHT, without wheezes or crackles Cardiovascular: Tachycardic, without murmur gallop or rub normal S1 and S2 Abdomen: Positive tender to palpation, positive distended, negative soft, bowel sounds p no rebound, no ascites, no appreciable mass Extremities: No significant cyanosis, clubbing, or edema bilateral lower extremities Skin: Negative rashes, lesions, ulcers Psychiatric:  Negative depression, negative anxiety, negative fatigue, negative mania  Central nervous system:  Cranial nerves II through XII intact, tongue/uvula midline, all extremities muscle strength 5/5, sensation intact throughout, negative dysarthria, negative expressive aphasia, negative receptive aphasia.    Mobility Assessment (last 72 hours)     Mobility Assessment     Row Name 09/11/21 2300 09/10/21 2300 09/09/21 2300       Does patient have an order for bedrest or is patient medically unstable Yes- Bedfast (Level 1) - Complete Yes- Bedfast (Level 1) - Complete Yes- Bedfast (Level 1) - Complete                DVT prophylaxis: Subcu heparin Code Status: DNR Family Communication: 9/5 husband at bedside for discussion of plan of care all questions answered Status is: Inpatient    Dispo: The patient is from: Home              Anticipated d/c is to: Home              Anticipated d/c date is: > 3 days              Patient currently is not medically stable to d/c.  Procedures/Significant Events: 9/1 Thoracentesis: 9/4 spoke at length with lab and they never received sample for processing 9/2 CT chest W0 contrast -Overall no significant interval change in the appearance of the lungs since the prior CT of 09/04/2021. -Moderate left pleural effusion with near complete consolidation LLL/partial consolidation  lingula similar to prior CT. -Post radiation changes LUL similar to prior CT. -Interval resolution  right pleural effusion. -Fractures of the posterolateral ninth and eleventh rib on right.  No new  fracture. 9/4 DG CXR: -interval complete opacification left hemithorax suggesting atelectasis in left lung, possibly due to central obstructing mucous plug. Part of this opacity is due to left pleural effusion. -Small patchy infiltrate in right lower lung field may suggest atelectasis/pneumonia. There is no pneumothorax. 9/4 KUB There is increased amount of gas which appears to be mostly in the colon suggesting possible ileus or distal colonic obstruction.  9/5 LEFT Thoracentesis 292ml Aspirated   Consultants:  PCCM Dr. Elsworth Soho    Cultures  8/31 MRSA by PCR negative 9/1 LEFT lung pleural fluid never sent per lab; ADDENDUM 9/5 fluid found pending 9/2 blood LEFT AC Positive Pasteurella Multocida 9/2 blood RIGHT AC Positive Pasteurella Multocida 9/5 LEFT pleural fluid pending    Antimicrobials: Anti-infectives (From admission, onward)    Start     Ordered Stop   09/11/21 1600  Ampicillin-Sulbactam (UNASYN) 3 g in sodium chloride 0.9 % 100 mL IVPB        09/11/21 1454     09/10/21 1145  ceFEPIme (MAXIPIME) 2 g in sodium chloride 0.9 % 100 mL IVPB  Status:  Discontinued        09/10/21 1052 09/11/21 1452   09/09/21 1900  cefTRIAXone (ROCEPHIN) 1 g in sodium chloride 0.9 % 100 mL IVPB        09/09/21 1850 09/09/21 2106   09/09/21 1900  azithromycin (ZITHROMAX) 500 mg in sodium chloride 0.9 % 250 mL IVPB        09/09/21 1850 09/09/21 2112              A/P  Positive Pasteurella Multocida Bacteremia -Continue current antibiotic.  Await speciation  Recurrent left pleural effusion -s/p left thoracentesis yesterday.  -cxr shows reaccumulation of her pleural effusion. Doubt hemothorax as pt's HgB is actually higher today than 5 days ago.  -Cytology pending on pleural fluid from yesterday.  -Obtain CT chest to evaluate.  Patient has no air movement in lower portion of left upper lobe, lingula, left lower lobe secondary to mass, pleural effusion, and radiation changes to lung  tissue. -Unsure fluid was sent to lab for appropriate lab work-up.  Will need to call main lab in a.m. and speak with lab techs to determine where specimen was sent. -Flutter valve - Incentive spirometry - DuoNeb -Mucinex DM -9/4 review of CT chest 9/2 effusion appears loculated -9/4 LEFT thoracentesis pending -9/4 consult Dr. Leory Plowman Icard or on call PCCM in A.m. 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 Lorna Few oncology patient with recurrent cancer? -9/5 discussed case with Dr. Elsworth Soho PCCM will see patient, aware that patient now in IR for repeat thoracentesis -9/5 continue to hold Eliquis May require chest tube.   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  -Pt has not had any surgery.  -Oncology concerned about enlarged pulmonary lymph nodes. Possible metastasis to LN. -they wanted an outpatient PET/CT. Discussed with family that  pt's pleural effusion could be malignant and that would mean that pt's lung cancer has returned.  - 9/4 CT recurrent pleural effusion   Dysarthria -Initial head CT is negative.  -Pt stopped her Eliquis 48 hours ago in preparation for kyphoplasty for her vertebral fracture.  -Pt without any focal deficits now except for an exceedingly nasal-quality voice...something her family states is not normal for pt's speech pattern.  May need MRI brain to further evaluate. Pt outside window for any intervention as her LKN was evening of 09-08-2021.   Fracture of seventh thoracic vertebra (HCC) -Pt has been awaiting kyphoplasty for 2-3 months now. Was scheduled for this week.  -Discussed with family that if pt has active infection with pneumonia, then most likely kyphoplasty would have to be canceled.   Acute on Chronic respiratory failure with hypoxia (3 L O2 ) - Secondary to recurrent pleural effusion most likely loculated, most likely malignant.  AF  (paroxysmal atrial fibrillation) (HCC) -Currently on subcu heparin which is not therapeutic for A-fib.  Will discuss with family and patient about restarting Eliquis.  Patient is not going to receive kyphoplasty given her multiple medical problems.   Essential hypertension -Toprol 25 mg daily   Constipation - 9/3 patient requests enema - 9/3 lactulose enema -9/4 lactulose 30 g BID (hold) SBO  SBO/ileus - 9/4 KUB C/W ileus/SBO.  Place NG tube for decompression. -9/5 consider obtaining CT abdomen and pelvis mets?  Hypokalemia - Potassium goal> 4 - 9/3 Potassium IV 50 mEq  Hypomagnesmia - Magnesium goal> 2 - 9/3 Magnesium IV 3 g        Care during the described time interval was provided by me .  I have reviewed this patient's available data, including medical history, events of note, physical examination, and all test results as part of my evaluation.

## 2021-09-12 NOTE — Progress Notes (Signed)
Limited US of left chest finds densely loculated effusion with innumerable septations. Likely trapped lung, which would explain why pt had severe cough after previous thoracentesis Procedure NOT performed. Consider pulmonary consult for consideration of chest tube/tPa  Ascencion Dike PA-C Interventional Radiology 09/12/2021 8:29 AM

## 2021-09-13 ENCOUNTER — Encounter (HOSPITAL_COMMUNITY)
Admission: EM | Disposition: A | Discharge status: Hospice | Payer: Self-pay | Source: Home / Self Care | Attending: Internal Medicine

## 2021-09-13 ENCOUNTER — Telehealth: Payer: Self-pay | Admitting: *Deleted

## 2021-09-13 ENCOUNTER — Inpatient Hospital Stay (HOSPITAL_COMMUNITY): Payer: Medicare HMO

## 2021-09-13 DIAGNOSIS — J90 Pleural effusion, not elsewhere classified: Secondary | ICD-10-CM | POA: Diagnosis not present

## 2021-09-13 LAB — COMPREHENSIVE METABOLIC PANEL
ALT: 29 U/L (ref 0–44)
AST: 32 U/L (ref 15–41)
Albumin: 2 g/dL — ABNORMAL LOW (ref 3.5–5.0)
Alkaline Phosphatase: 89 U/L (ref 38–126)
Anion gap: 12 (ref 5–15)
BUN: 26 mg/dL — ABNORMAL HIGH (ref 8–23)
CO2: 25 mmol/L (ref 22–32)
Calcium: 8.4 mg/dL — ABNORMAL LOW (ref 8.9–10.3)
Chloride: 97 mmol/L — ABNORMAL LOW (ref 98–111)
Creatinine, Ser: 0.76 mg/dL (ref 0.44–1.00)
GFR, Estimated: >60 mL/min (ref >60)
Glucose, Bld: 98 mg/dL (ref 70–99)
Potassium: 3.7 mmol/L (ref 3.5–5.1)
Sodium: 134 mmol/L — ABNORMAL LOW (ref 135–145)
Total Bilirubin: 0.7 mg/dL (ref 0.3–1.2)
Total Protein: 5.3 g/dL — ABNORMAL LOW (ref 6.5–8.1)

## 2021-09-13 LAB — CBC WITH DIFFERENTIAL/PLATELET
Abs Immature Granulocytes: 0.07 10*3/uL (ref 0.00–0.07)
Basophils Absolute: 0 10*3/uL (ref 0.0–0.1)
Basophils Relative: 0 %
Eosinophils Absolute: 0 10*3/uL (ref 0.0–0.5)
Eosinophils Relative: 0 %
HCT: 26.7 % — ABNORMAL LOW (ref 36.0–46.0)
Hemoglobin: 9.6 g/dL — ABNORMAL LOW (ref 12.0–15.0)
Immature Granulocytes: 1 %
Lymphocytes Relative: 2 %
Lymphs Abs: 0.2 10*3/uL — ABNORMAL LOW (ref 0.7–4.0)
MCH: 37.2 pg — ABNORMAL HIGH (ref 26.0–34.0)
MCHC: 36 g/dL (ref 30.0–36.0)
MCV: 103.5 fL — ABNORMAL HIGH (ref 80.0–100.0)
Monocytes Absolute: 0.8 10*3/uL (ref 0.1–1.0)
Monocytes Relative: 6 %
Neutro Abs: 12.7 10*3/uL — ABNORMAL HIGH (ref 1.7–7.7)
Neutrophils Relative %: 91 %
Platelets: 113 10*3/uL — ABNORMAL LOW (ref 150–400)
RBC: 2.58 MIL/uL — ABNORMAL LOW (ref 3.87–5.11)
RDW: 14.5 % (ref 11.5–15.5)
WBC: 13.8 10*3/uL — ABNORMAL HIGH (ref 4.0–10.5)
nRBC: 0 % (ref 0.0–0.2)

## 2021-09-13 LAB — PHOSPHORUS: Phosphorus: 3.2 mg/dL (ref 2.5–4.6)

## 2021-09-13 LAB — LACTATE DEHYDROGENASE: LDH: 152 U/L (ref 98–192)

## 2021-09-13 LAB — MAGNESIUM: Magnesium: 1.7 mg/dL (ref 1.7–2.4)

## 2021-09-13 SURGERY — CHEST TUBE INSERTION
Anesthesia: LOCAL | Laterality: Left

## 2021-09-13 MED ORDER — BISACODYL 10 MG RE SUPP
10.0000 mg | Freq: Once | RECTAL | Status: AC
  Administered 2021-09-14: 10 mg via RECTAL
  Filled 2021-09-13: qty 1

## 2021-09-13 MED ORDER — MAGNESIUM OXIDE -MG SUPPLEMENT 400 (240 MG) MG PO TABS
200.0000 mg | ORAL_TABLET | Freq: Two times a day (BID) | ORAL | Status: DC
  Administered 2021-09-13: 200 mg via ORAL
  Filled 2021-09-13: qty 1

## 2021-09-13 MED ORDER — GUAIFENESIN ER 600 MG PO TB12
1200.0000 mg | ORAL_TABLET | Freq: Two times a day (BID) | ORAL | Status: DC
  Administered 2021-09-13 – 2021-09-22 (×19): 1200 mg via ORAL
  Filled 2021-09-13 (×21): qty 2

## 2021-09-13 NOTE — Telephone Encounter (Signed)
     Patient  visit on [facility]   09/05/2021 was for SOB  Have you been able to follow up with your primary care physician? Patient in hospital due to exascerbating symptoms  frpm lung cancer  The patient was able to obtain any needed medicine or equipment.  Are there diet recommendations that you are having difficulty following?  Patient expresses understanding of discharge instructions and education provided has no other needs at this time.    [facility] [phone] [address] , [place] [address] Email : [email or web address]

## 2021-09-13 NOTE — Progress Notes (Signed)
PROGRESS NOTE    Patricia [name]  MBW:[id number] DOB: 10/01/1950 DOA: 09/09/2021 PCP: [name], MD   Brief Narrative: 70 year old with past medical history significant for small cell lung cancer on the left side, A-fib, recent vertebral fracture awaiting kyphoplasty, pleural effusion status postthoracentesis the day prior to admission.  She presented to the ED complaining of worsening shortness of breath and with altered mental status.  After she had thoracentesis, she went home family had difficulty waking her up.  She took some extra sleeping medicine, Klonopin, Soma, Atarax.  Patient was found to be hypoxic with oxygen saturation 85 room air at home.  Evaluation in the ED she was tachycardic, she was satting 94% on 4 L.  CT chest showed moderate left pleural effusion with near complete consolidation of the left lower lobe and partial consolidation of the lingula, interval resolution of previously seen right pleural effusion.  Admitted with acute hypoxic respiratory failure in the setting of pleural effusion. Neurologist was consulted and patient underwent thoracentesis yielding 200 cc of clear but hemorrhagic fluid.  Patient found to have parapneumonic effusion.  Dr. Katy Fitch discussed with patient, patient would like to proceed with more comfort care approach.  Dr. Katy Fitch to discuss with husband.  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  Total nucleated cell 2,650. Cytology:  WBC trending down.  Patient had a long conversation with Dr. Katy Fitch she is interested in home with hospice.  Dr. Katy Fitch to speak with her husband. If patient and family wishes to proceed with further treatment she will require a chest tube placement and fibrinolysis.  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   Small cell carcinoma of lower lobe on the left Status post chemoradiation Oncology concerned about enlarged pulmonary lymph nodes. Possible metastasis to LN.- outpatient PET/CT  Dysarthria: Initial CT head negative Patient is stop her Eliquis 48 hours ago in preparation for kyphoplasty for her vertebral fracture. Patient with nasal quality to voice, not her usual speech pattern. MRI; 9/3: No acute intracranial abnormality.  Mild sequela of whole brain radiation with noted strong evidence of metastatic disease in this noncontrast exam.  Fracture of seventh thoracic vertebra Is scheduled for kyphoplasty take for this week. With active infection, no safe to proceed with kyphoplasty  A-fib: Holding Eliquis, awaiting decision for chest tube.  Hypertension: Continue with Toprol  Constipation, SBO vs Ileus;  Patient remove NGT .  Patient has soft mushy BM two day ago.  NPO Will get FU KUB.   Hypokalemia; replaced.   Hypomagnesemia. Start oral magnesium    Estimated body mass index is 19.68 kg/m as calculated from the following:   Height as of this encounter: 5\' 7"  (1.702 m).   Weight as of this encounter: 57 kg.   DVT prophylaxis: Heparin  Code Status: DNR Family Communication: husband at bedside.  Disposition Plan:  Status is: Inpatient Remains inpatient appropriate because: management pleural effusion, Ileus.  Consultants:  Pulmonology   Procedures:  9/1 Thoracentesis: 9/4 spoke at length with lab and they never received sample for processing 9/2 CT chest W0  contrast -Overall no significant interval change in the appearance of the lungs since the prior CT of 09/04/2021. -Moderate left pleural effusion with near complete consolidation LLL/partial consolidation  lingula similar to prior CT. -Post radiation changes LUL similar to prior CT. -Interval resolution  right pleural effusion. -Fractures of the posterolateral ninth and eleventh rib on right.  No new fracture. 9/4 DG CXR: -interval complete opacification left hemithorax suggesting atelectasis in left lung, possibly due to central obstructing mucous plug. Part of this opacity is due to left pleural effusion. -Small patchy infiltrate in right lower lung field may suggest atelectasis/pneumonia. There is no pneumothorax. 9/4 KUB There is increased amount of gas which appears to be mostly in the colon suggesting possible ileus or distal colonic obstruction.  9/5 LEFT Thoracentesis 252ml Aspirated  Antimicrobials:    Subjective: She denies worsening dyspnea. Passing gas.  She does not want chest tube. Husband would like to speak with Dr Valeta Harms.   Objective: Vitals:   09/12/21 1957 09/12/21 2040 09/13/21 0411 09/13/21 0453  BP:  128/63 113/74   Pulse:  99 99   Resp:  20 20   Temp:  (!) 97.3 F (36.3 C) 97.7 F (36.5 C)   TempSrc:  Oral Oral   SpO2: 96% 99% 94%   Weight:    57 kg  Height:        Intake/Output Summary (Last 24 hours) at 09/13/2021 0806 Last data filed at 09/12/2021 2250 Gross per 24 hour  Intake --  Output 350 ml  Net -350 ml   Filed Weights   09/11/21 0527 09/12/21 0433 09/13/21 0453  Weight: 44.5 kg 44 kg 57 kg    Examination:  General exam: Appears calm and comfortable  Respiratory system: Clear to auscultation. Respiratory effort normal. Cardiovascular system: S1 & S2 heard, RRR.  Gastrointestinal system: Abdomen is nondistended, soft and nontender. No organomegaly or masses felt. Normal bowel sounds heard. Central nervous system: Alert and oriented. No  focal neurological deficits. Extremities: Symmetric 5 x 5 power.    Data Reviewed: I have personally reviewed following labs and imaging studies  CBC: Recent Labs  Lab 09/09/21 1515 09/09/21 1524 09/10/21 0242 09/11/21 0229 09/12/21 0254  WBC 16.9*  --  16.2* 12.8* 14.3*  NEUTROABS 16.2*  --  15.0* 11.9* 13.1*  HGB 13.6 13.9 11.3* 10.2* 10.3*  HCT 38.5 41.0 31.4* 28.4* 28.7*  MCV 105.2*  --  102.6* 102.9* 102.5*  PLT 178  --  132* 122* 354*   Basic Metabolic Panel: Recent Labs  Lab 09/09/21 1515 09/09/21 1524 09/10/21 0242 09/11/21 0229 09/12/21 0254  NA 132* 131* 131* 131* 130*  K 3.9 4.0 3.4* 4.0 4.1  CL 94*  --  93* 97* 96*  CO2 26  --  24 24 25   GLUCOSE 145*  --  111* 97 113*  BUN 18  --  25* 30* 28*  CREATININE 0.96  --  1.03* 0.82 0.67  CALCIUM 9.1  --  8.8* 8.6* 8.5*  MG 1.7  --  1.6* 2.1 1.8  PHOS  --   --   --  3.2 2.7   GFR: Estimated Creatinine Clearance: 57.2 mL/min (by C-G formula based on SCr of 0.67 mg/dL). Liver Function Tests: Recent Labs  Lab 09/09/21 1515 09/10/21 0242 09/11/21 0229 09/12/21 0254  AST 33 41 29  18  ALT 44 47* 40 30  ALKPHOS 96 71 71 77  BILITOT 0.5 0.3 0.5 0.6  PROT 6.8 5.8* 5.5* 5.6*  ALBUMIN 3.4* 2.8* 2.4* 2.2*   No results for input(s): "LIPASE", "AMYLASE" in the last 168 hours. Recent Labs  Lab 09/09/21 1520 09/11/21 1638  AMMONIA 19 17   Coagulation Profile: No results for input(s): "INR", "PROTIME" in the last 168 hours. Cardiac Enzymes: No results for input(s): "CKTOTAL", "CKMB", "CKMBINDEX", "TROPONINI" in the last 168 hours. BNP (last 3 results) No results for input(s): "PROBNP" in the last 8760 hours. HbA1C: No results for input(s): "HGBA1C" in the last 72 hours. CBG: Recent Labs  Lab 09/09/21 1557 09/09/21 1915  GLUCAP 142* 106*   Lipid Profile: No results for input(s): "CHOL", "HDL", "LDLCALC", "TRIG", "CHOLHDL", "LDLDIRECT" in the last 72 hours. Thyroid Function Tests: No results for  input(s): "TSH", "T4TOTAL", "FREET4", "T3FREE", "THYROIDAB" in the last 72 hours. Anemia Panel: No results for input(s): "VITAMINB12", "FOLATE", "FERRITIN", "TIBC", "IRON", "RETICCTPCT" in the last 72 hours. Sepsis Labs: Recent Labs  Lab 09/09/21 1515 09/11/21 1638 09/11/21 1821  PROCALCITON 1.23  --   --   LATICACIDVEN  --  1.5 1.4    Recent Results (from the past 240 hour(s))  Surgical pcr screen     Status: None   Collection Time: 09/07/21  2:49 PM   Specimen: Nasal Mucosa; Nasal Swab  Result Value Ref Range Status   MRSA, PCR NEGATIVE NEGATIVE Final   Staphylococcus aureus NEGATIVE NEGATIVE Final    Comment: (NOTE) The Xpert SA Assay (FDA approved for NASAL specimens in patients 52 years of age and older), is one component of a comprehensive surveillance program. It is not intended to diagnose infection nor to guide or monitor treatment. Performed at [facility], [place] [address] [place], [place] [address]   Blood culture (routine x 2)     Status: Abnormal   Collection Time: 09/09/21  3:17 PM   Specimen: BLOOD  Result Value Ref Range Status   Specimen Description BLOOD LEFT ANTECUBITAL  Final   Special Requests   Final    BOTTLES DRAWN AEROBIC AND ANAEROBIC Blood Culture adequate volume   Culture  Setup Time   Final    GRAM NEGATIVE RODS IN BOTH AEROBIC AND ANAEROBIC BOTTLES CRITICAL VALUE NOTED.  VALUE IS CONSISTENT WITH PREVIOUSLY REPORTED AND CALLED VALUE.    Culture (A)  Final    PASTEURELLA MULTOCIDA Usually susceptible to penicillin and other beta lactam agents,quinolones,macrolides and tetracyclines. HEALTH DEPARTMENT NOTIFIED Performed at [facility], [place] [address] [place], [place] [address]    Report Status 09/12/2021 FINAL  Final  Blood culture (routine x 2)     Status: Abnormal   Collection Time: 09/09/21  3:22 PM   Specimen: BLOOD  Result Value Ref Range Status   Specimen Description BLOOD RIGHT ANTECUBITAL  Final   Special Requests    Final    BOTTLES DRAWN AEROBIC AND ANAEROBIC Blood Culture adequate volume   Culture  Setup Time   Final    GRAM NEGATIVE RODS IN BOTH AEROBIC AND ANAEROBIC BOTTLES CRITICAL RESULT CALLED TO, READ BACK BY AND VERIFIED WITH: PHARMD HEATHER [phone] [id number] FCP    Culture (A)  Final    PASTEURELLA MULTOCIDA Usually susceptible to penicillin and other beta lactam agents,quinolones,macrolides and tetracyclines. HEALTH DEPARTMENT NOTIFIED Performed at [facility], [place] [address], [place], [place] [address]    Report Status 09/12/2021 FINAL  Final  Blood Culture ID Panel (Reflexed)     Status: None   Collection Time: 09/09/21  3:22 PM  Result Value Ref Range Status   Enterococcus faecalis NOT DETECTED NOT DETECTED Final   Enterococcus Faecium NOT DETECTED NOT DETECTED Final   Listeria monocytogenes NOT DETECTED NOT DETECTED Final    Comment: CRITICAL RESULT CALLED TO, READ BACK BY AND VERIFIED WITH: PHARMD HEATHER W [id number] FCP    Staphylococcus species NOT DETECTED NOT DETECTED Final   Staphylococcus aureus (BCID) NOT DETECTED NOT DETECTED Final   Staphylococcus epidermidis NOT DETECTED NOT DETECTED Final   Staphylococcus lugdunensis NOT DETECTED NOT DETECTED Final   Streptococcus species NOT DETECTED NOT DETECTED Final   Streptococcus agalactiae NOT DETECTED NOT DETECTED Final   Streptococcus pneumoniae NOT DETECTED NOT DETECTED Final   Streptococcus pyogenes NOT DETECTED NOT DETECTED Final   A.calcoaceticus-baumannii NOT DETECTED NOT DETECTED Final   Bacteroides fragilis NOT DETECTED NOT DETECTED Final   Enterobacterales NOT DETECTED NOT DETECTED Final   Enterobacter cloacae complex NOT DETECTED NOT DETECTED Final   Escherichia coli NOT DETECTED NOT DETECTED Final   Klebsiella aerogenes NOT DETECTED NOT DETECTED Final   Klebsiella oxytoca NOT DETECTED NOT DETECTED Final   Klebsiella pneumoniae NOT DETECTED NOT DETECTED Final   Proteus species NOT DETECTED NOT DETECTED  Final   Salmonella species NOT DETECTED NOT DETECTED Final   Serratia marcescens NOT DETECTED NOT DETECTED Final   Haemophilus influenzae NOT DETECTED NOT DETECTED Final   Neisseria meningitidis NOT DETECTED NOT DETECTED Final   Pseudomonas aeruginosa NOT DETECTED NOT DETECTED Final   Stenotrophomonas maltophilia NOT DETECTED NOT DETECTED Final   Candida albicans NOT DETECTED NOT DETECTED Final   Candida auris NOT DETECTED NOT DETECTED Final   Candida glabrata NOT DETECTED NOT DETECTED Final   Candida krusei NOT DETECTED NOT DETECTED Final   Candida parapsilosis NOT DETECTED NOT DETECTED Final   Candida tropicalis NOT DETECTED NOT DETECTED Final   Cryptococcus neoformans/gattii NOT DETECTED NOT DETECTED Final    Comment: Performed at [facility], [address], [place], [place] [address]  Expectorated Sputum Assessment w Gram Stain, Rflx to Resp Cult     Status: None   Collection Time: 09/10/21  1:31 PM   Specimen: Sputum  Result Value Ref Range Status   Specimen Description SPU  Final   Special Requests NONE  Final   Sputum evaluation   Final    Sputum specimen not acceptable for testing.  Please recollect.   Gram Stain Report Called to,Read Back By and Verified With: RN ACorinna Capra [phone] @2114  FH Performed at [facility], [place] [address] [place], [place] [address]    Report Status 09/12/2021 FINAL  Final  Urine Culture     Status: None   Collection Time: 09/11/21 10:10 AM   Specimen: Urine, Catheterized  Result Value Ref Range Status   Specimen Description URINE, CATHETERIZED  Final   Special Requests Normal  Final   Culture   Final    NO GROWTH Performed at [place] [address], [place],  [address]    Report Status 09/12/2021 FINAL  Final         Radiology Studies: DG CHEST PORT 1 VIEW  Result Date: 09/12/2021 CLINICAL DATA:  Atelectasis of left lung EXAM: PORTABLE CHEST 1 VIEW COMPARISON:  Previous studies including the examination of  09/11/2021 FINDINGS: There is partial improvement in aeration in left upper and left mid  lung fields. These continued opacification in the left lower lung field. There are faint radiopacities in the periphery of left upper lung field, possibly suggesting loculated effusions. There is blunting of right lateral costophrenic angle. Increased interstitial markings are seen in right lower lung field. There is no pneumothorax. IMPRESSION: There is interval partial improvement in aeration in left lung suggesting decrease in atelectasis. Still, there is complete opacification of left lower lung field and partial opacification of left upper lung fields suggesting residual atelectasis/pneumonia and pleural effusion. Increased interstitial markings in right lower lung fields suggest interstitial pneumonia. Small right pleural effusion. Electronically Signed   By: Elmer Picker M.D.   On: 09/12/2021 16:24   IR US CHEST  Result Date: 09/12/2021 CLINICAL DATA:  Left lung atelectasis with component of pleural fluid. History of small cell lung carcinoma. EXAM: CHEST ULTRASOUND COMPARISON:  CT of the chest on 09/09/2021 FINDINGS: By ultrasound, component of pleural fluid is relatively small and extremely complex, multiloculated and septated. Thoracentesis was not performed today. The predominant component opacity by chest x-ray yesterday clearly represents atelectasis of essentially the entire left lung from bronchial obstruction. IMPRESSION: Component of pleural fluid is relatively small and very complex, multiloculated and multi-septated by ultrasound. Thoracentesis was not performed. Progressive opacification of the left hemithorax by chest x-ray clearly represents central left bronchial obstruction. Electronically Signed   By: Aletta Edouard M.D.   On: 09/12/2021 08:49   DG Abd Portable 1V  Result Date: 09/11/2021 CLINICAL DATA:  NG placement. EXAM: PORTABLE ABDOMEN - 1 VIEW COMPARISON:  CT abdomen pelvis dated  08/03/2021 and abdominal radiograph dated 09/11/2021. FINDINGS: Partially visualized enteric tube with tip and side-port in the left upper abdomen in the stomach. Diffuse small bowel dilatation measure up to 6 cm. There is opacification of the visualized left lung. IMPRESSION: 1. Partially visualized enteric tube with tip and side-port in the stomach. 2. Diffuse small bowel dilatation. Electronically Signed   By: Anner Crete M.D.   On: 09/11/2021 20:04   DG Abd 1 View  Result Date: 09/11/2021 CLINICAL DATA:  Abdominal distension EXAM: ABDOMEN - 1 VIEW COMPARISON:  Previous studies including CT done on 08/03/2021 and abdomen radiograph done on 07/26/2021 FINDINGS: There is opacification of visualized left lower lung field. Small patchy infiltrate is seen right lower lung field. There is increase in the amount of gas in colon. Moderate amount of stool is seen in the colon without signs of fecal impaction in rectum. There is no definite demonstrable small-bowel dilation. Stomach is not distended. IMPRESSION: There is increased amount of gas which appears to be mostly in the colon suggesting possible ileus or distal colonic obstruction. There is no significant small bowel dilation. There is opacification of left lower lung fields suggesting large pleural effusion. Electronically Signed   By: Elmer Picker M.D.   On: 09/11/2021 16:30   DG Chest 2 View  Result Date: 09/11/2021 CLINICAL DATA:  Pleural effusion EXAM: CHEST - 2 VIEW COMPARISON:  Previous studies including the examination of 09/09/2021 FINDINGS: There is interval complete opacification of left hemithorax. This suggests atelectasis in left lung, possibly due to central obstructing mucous plug. Small patchy infiltrate is seen in right lower lung field. Right lateral CP angle is clear. There is no pneumothorax. IMPRESSION: There is interval complete opacification of left hemithorax suggesting atelectasis in left lung, possibly due to central  obstructing mucous plug. Part of this opacity is due to left pleural effusion. Small patchy infiltrate in right lower lung  field may suggest atelectasis/pneumonia. There is no pneumothorax. These results will be called to the ordering clinician or representative by the Radiologist Assistant, and communication documented in the PACS or [employer]. Electronically Signed   By: Elmer Picker M.D.   On: 09/11/2021 16:26        Scheduled Meds:  amiodarone  200 mg Oral Daily   Chlorhexidine Gluconate Cloth  6 each Topical Daily   escitalopram  10 mg Oral Daily   heparin  5,000 Units Subcutaneous Q8H   ipratropium-albuterol  3 mL Nebulization BID   metoprolol succinate  25 mg Oral Daily   pantoprazole (PROTONIX) IV  40 mg Intravenous Q12H   rosuvastatin  5 mg Oral Daily   sodium chloride HYPERTONIC  4 mL Nebulization BID   Continuous Infusions:  sodium chloride 10 mL/hr at 09/11/21 1004   ampicillin-sulbactam (UNASYN) IV 3 g (09/13/21 0416)   methocarbamol (ROBAXIN) IV       LOS: 4 days    Time spent: 35 minutes.    Elmarie Shiley, MD [facility]   If 7PM-7AM, please contact night-coverage [email or web address]  09/13/2021, 8:06 AM

## 2021-09-13 NOTE — Progress Notes (Signed)
NAME:  Patricia [name], MRN:  [id number], DOB:  05-Jan-1951, LOS: 4 ADMISSION DATE:  09/09/2021, CONSULTATION DATE:  09/13/21 REFERRING MD:  Sherral Hammers, CHIEF COMPLAINT:  pleural effusion   History of Present Illness:  Patricia [name] is a 71 y.o. 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  CT chest was done which showed L pleural effusion and enlarged subcarinal lymph node.  She saw her oncologist Dr. Earlie Server on 8/29 and was sent for thoracentesis on 9/1.  750cc straw colored fluid withdrawn before severe coughing aborted procedure.  The next day on 9/2 she presented with worsened mental status and hypoxia to 85% on baseline home 3L O2.   CXR showed re-accumulation of L pleural effusion, WBC elevated to 16k and she was admitted to the hospitalists service.  Repeat CT chest with repeated L pleural effusion and lingula consolidation.  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   IR was consulted for repeat thora, however noted densely loculated effusions with septations so opted not to proceed.   PCCM consulted for possible chest tube.  Cytology pending from prior thoracentesis.  Pt reports feeling "terrible" but is on 4L and states her breathing is about the same.  She has pain in her ribs and back with coughing, feels weak and nauseated.  She states that if her cancer has returned, she would not be interested in pursuing further chemo/radiation.  Pertinent  Medical History   has a past medical history of Anemia, Anxiety, GERD (gastroesophageal reflux disease), Hyperlipidemia, Hypertension, IBS (irritable bowel syndrome), Insomnia, Low back pain, Lung cancer ([place]) (10/2020), TIA (transient  ischemic attack) (10/2017), Vitamin B12 deficiency, and Vitamin D deficiency. Small cell lung cancer    Significant Hospital Events: Including procedures, antibiotic start and stop dates in addition to other pertinent events   9/1 outpatient thoracentesis by IR 9/2 presented to the ED with AMS and dyspnea 9/5 PCCM consult  Interim History / Subjective:   Tachycardic, fatigued, weak  Objective   Blood pressure 113/74, pulse (!) 123, temperature 97.7 F (36.5 C), temperature source Oral, resp. rate (!) 28, height 5\' 7"  (1.702 m), weight 57 kg, SpO2 98 %.    FiO2 (%):  [36 %] 36 %   Intake/Output Summary (Last 24 hours) at 09/13/2021 0956 Last data filed at 09/12/2021 2250 Gross per 24 hour  Intake --  Output 350 ml  Net -350 ml   Filed Weights   09/11/21 0527 09/12/21 0433 09/13/21 0453  Weight: 44.5 kg 44 kg 57 kg   General: Chronically ill-appearing female HEENT: Mucous membranes moist, NCAT Neuro: Awake alert following commands. CV: Regular rate rhythm, S1-S2 PULM: Diminished breath sounds bilaterally no crackles no wheeze GI: s soft, nontender nondistended Extremities: Warm dry no edema Skin: No rash  Resolved Hospital Problem list    Assessment & Plan:   Left-sided parapneumonic effusion Small cell lung Ca (2022) treated with chemo/radiation with enlarged subcarinal lymph node  Pasturella Multocida bacteremia  Plan: Parapneumonic effusion, status post thoracentesis Ultrasound imaging with loculations Continue on antimicrobials with Unasyn at this time. Today we discussed chest tube placement with patient. She is somewhat distressed about everything that is going on. She is very weak. We had a  long discussion about her goals of care and desire. She is interested in potentially going home on home hospice. Even if that was her decision to go home on hospice I do think she needs to complete a prolonged course of antimicrobials. If we wanted to treat her  parapneumonic effusion the next step would be chest tube placement to consider fibrinolysis but this would require at least 4-5 hospital day stay.  If she is wanting to go home.  I will reach out to her primary doctor, Dr. Tyrell Antonio I have also placed a referral to palliative care.  Best Practice (right click and "Reselect all SmartList Selections" daily)   Per primary  Labs   CBC: Recent Labs  Lab 09/09/21 1515 09/09/21 1524 09/10/21 0242 09/11/21 0229 09/12/21 0254 09/13/21 0735  WBC 16.9*  --  16.2* 12.8* 14.3* 13.8*  NEUTROABS 16.2*  --  15.0* 11.9* 13.1* 12.7*  HGB 13.6 13.9 11.3* 10.2* 10.3* 9.6*  HCT 38.5 41.0 31.4* 28.4* 28.7* 26.7*  MCV 105.2*  --  102.6* 102.9* 102.5* 103.5*  PLT 178  --  132* 122* 128* 113*    Basic Metabolic Panel: Recent Labs  Lab 09/09/21 1515 09/09/21 1524 09/10/21 0242 09/11/21 0229 09/12/21 0254 09/13/21 0735  NA 132* 131* 131* 131* 130* 134*  K 3.9 4.0 3.4* 4.0 4.1 3.7  CL 94*  --  93* 97* 96* 97*  CO2 26  --  24 24 25 25   GLUCOSE 145*  --  111* 97 113* 98  BUN 18  --  25* 30* 28* 26*  CREATININE 0.96  --  1.03* 0.82 0.67 0.76  CALCIUM 9.1  --  8.8* 8.6* 8.5* 8.4*  MG 1.7  --  1.6* 2.1 1.8 1.7  PHOS  --   --   --  3.2 2.7 3.2   GFR: Estimated Creatinine Clearance: 57.2 mL/min (by C-G formula based on SCr of 0.76 mg/dL). Recent Labs  Lab 09/09/21 1515 09/10/21 0242 09/11/21 0229 09/11/21 1638 09/11/21 1821 09/12/21 0254 09/13/21 0735  PROCALCITON 1.23  --   --   --   --   --   --   WBC 16.9* 16.2* 12.8*  --   --  14.3* 13.8*  LATICACIDVEN  --   --   --  1.5 1.4  --   --     Liver Function Tests: Recent Labs  Lab 09/09/21 1515 09/10/21 0242 09/11/21 0229 09/12/21 0254 09/13/21 0735  AST 33 41 29 18 32  ALT 44 47* 40 30 29  ALKPHOS 96 71 71 77 89  BILITOT 0.5 0.3 0.5 0.6 0.7  PROT 6.8 5.8* 5.5* 5.6* 5.3*  ALBUMIN 3.4* 2.8* 2.4* 2.2* 2.0*   No results for input(s): "LIPASE", "AMYLASE" in the last 168  hours. Recent Labs  Lab 09/09/21 1520 09/11/21 1638  AMMONIA 19 17    ABG    Component Value Date/Time   PHART 7.44 09/11/2021 1630   PCO2ART 43 09/11/2021 1630   PO2ART 81 (L) 09/11/2021 1630   HCO3 29.2 (H) 09/11/2021 1630   TCO2 32 09/09/2021 1524   O2SAT 98.2 09/11/2021 1630     Coagulation Profile: No results for input(s): "INR", "PROTIME" in the last 168 hours.  Cardiac Enzymes: No results for input(s): "CKTOTAL", "CKMB", "CKMBINDEX", "TROPONINI" in the last 168 hours.  HbA1C: No results found for: "HGBA1C"  CBG: Recent Labs  Lab 09/09/21 1557 09/09/21 1915  GLUCAP 142* 106*    Review of Systems:  Please see the history of present illness. All other systems reviewed and are negative    Past Medical History:  She,  has a past medical history of Anemia, Anxiety, GERD (gastroesophageal reflux disease), Hyperlipidemia, Hypertension, IBS (irritable bowel syndrome), Insomnia, Low back pain, Lung cancer ([place]) (10/2020), TIA (transient ischemic attack) (10/2017), Vitamin B12 deficiency, and Vitamin D deficiency.   Surgical History:   Past Surgical History:  Procedure Laterality Date   BACK SURGERY     BREAST EXCISIONAL BIOPSY Left 1997   benign   BRONCHIAL BIOPSY  10/18/2020   Procedure: BRONCHIAL BIOPSIES;  Surgeon: [name], DO;  Location: [facility];  Service: Pulmonary;;   BRONCHIAL BRUSHINGS  10/18/2020   Procedure: BRONCHIAL BRUSHINGS;  Surgeon: [name], DO;  Location: [place];  Service: Pulmonary;;   BRONCHIAL NEEDLE ASPIRATION BIOPSY  10/18/2020   Procedure: BRONCHIAL NEEDLE ASPIRATION BIOPSIES;  Surgeon: [name], DO;  Location: [place];  Service: Pulmonary;;   BRONCHIAL WASHINGS  10/18/2020   Procedure: BRONCHIAL WASHINGS;  Surgeon: [name], DO;  Location: [place];  Service: Pulmonary;;   CARDIOVERSION N/A 02/13/2021   Procedure: CARDIOVERSION;  Surgeon: Werner Lean, MD;  Location: [place];  Service: Cardiovascular;  Laterality: N/A;   CARDIOVERSION N/A 02/20/2021   Procedure: CARDIOVERSION;  Surgeon: Skeet Latch, MD;  Location: [place];  Service: Cardiovascular;  Laterality: N/A;   CARDIOVERSION N/A 03/22/2021   Procedure: CARDIOVERSION;  Surgeon: Freada Bergeron, MD;  Location: [facility];  Service: Cardiovascular;  Laterality: N/A;   hemorrhoidecotmy     HERNIA MESH REMOVAL     OPEN REDUCTION INTERNAL FIXATION (ORIF) DISTAL RADIAL FRACTURE Right 11/19/2019   Procedure: OPEN REDUCTION INTERNAL FIXATION (ORIF) DISTAL RADIUS AND ULNA FRACTURE WITH REPAIR AS NECESSARY;  Surgeon: Roseanne Kaufman, MD;  Location: [place];  Service: Orthopedics;  Laterality: Right;  2 hrs Block with IV sedation   ORIF RADIUS & ULNA FRACTURES     TONSILLECTOMY     VIDEO BRONCHOSCOPY WITH ENDOBRONCHIAL NAVIGATION Left 10/18/2020   Procedure: VIDEO BRONCHOSCOPY WITH ENDOBRONCHIAL NAVIGATION;  Surgeon: [name], DO;  Location: [place];  Service: Pulmonary;  Laterality: Left;  ION   VIDEO BRONCHOSCOPY WITH ENDOBRONCHIAL ULTRASOUND Bilateral 10/18/2020   Procedure: VIDEO BRONCHOSCOPY WITH ENDOBRONCHIAL ULTRASOUND;  Surgeon: [name], DO;  Location: [place];  Service: Pulmonary;  Laterality: Bilateral;   VIDEO BRONCHOSCOPY WITH RADIAL ENDOBRONCHIAL ULTRASOUND  10/18/2020   Procedure: VIDEO BRONCHOSCOPY WITH RADIAL ENDOBRONCHIAL ULTRASOUND;  Surgeon: [name], DO;  Location: [place] [facility];  Service: Pulmonary;;     Social History:   reports that she quit smoking about 21 months ago. Her smoking use included cigarettes. She started smoking about 51 years ago. She has a 50.00 pack-year smoking history. She has never used smokeless tobacco. She reports that she does not currently use alcohol. She reports that she does not use drugs.   Family History:  Her family history includes Breast cancer in her maternal aunt, paternal aunt, and sister.    Allergies Allergies  Allergen Reactions   Amoxicillin-Pot Clavulanate Nausea Only and Other (See Comments)    GI upset    Doxycycline Swelling    Oral swelling   Levaquin [Levofloxacin In D5w] Other (See Comments)    Joint problems    Magnesium Hydroxide Other (See Comments)    Welts    Oxycodone Nausea Only   Other Itching    Paper tape     Home Medications  Prior to Admission medications   Medication Sig Start Date End Date Taking? Authorizing Provider  acetaminophen (TYLENOL) 325 MG tablet Take 650 mg by mouth every 6 (six) hours as needed for headache or moderate pain.   Yes [provider]  albuterol (VENTOLIN HFA) 108 (90 Base) MCG/ACT inhaler Inhale 2 puffs into the lungs every 6 (six) hours as needed for shortness of breath. 03/04/21  Yes [provider]  amiodarone (PACERONE) 200 MG tablet Take 1 tablet (200 mg total) by mouth daily. 04/24/21  Yes Camnitz, Will Hassell Done, MD  apixaban (ELIQUIS) 5 MG TABS tablet TAKE 1 TABLET(5 MG) BY MOUTH TWICE DAILY 06/12/21  Yes Crenshaw, Denice Bors, MD  Black Cohosh (REMIFEMIN PO) Take 1 tablet by mouth 2 (two) times daily.   Yes [provider]  carboxymethylcellulose (REFRESH PLUS) 0.5 % SOLN Place 1 drop into both eyes 3 (three) times daily as needed (for dryness).    Yes [provider]  carisoprodol (SOMA) 350 MG tablet Take 350 mg by mouth 3 (three) times daily as needed for muscle spasms.   Yes [provider]  Cholecalciferol (VITAMIN D3) 25 MCG (1000 UT) CHEW Chew 1 tablet by mouth daily.   Yes [provider]  clobetasol ointment (TEMOVATE) 3.79 % Apply 1 application. topically daily as needed (vaginal irritation).   Yes [provider]  clonazePAM (KLONOPIN) 1 MG tablet Take 1 mg by mouth at bedtime.  10/15/17  Yes [provider]  Coenzyme Q10 200 MG capsule Take 400 mg by mouth daily.   Yes [provider]  Cyanocobalamin (B-12 PO) Take 2 capsules by  mouth daily.   Yes [provider]  docusate sodium (COLACE) 100 MG capsule Take 1 capsule (100 mg total) by mouth 2 (two) times daily. Patient taking differently: Take 300 mg by mouth daily. 01/03/21  Yes Pokhrel, Laxman, MD  escitalopram (LEXAPRO) 20 MG tablet Take 20 mg by mouth daily. 08/15/21  Yes [provider]  fluticasone (FLONASE) 50 MCG/ACT nasal spray Place 2 sprays into both nostrils daily as needed for allergies. 10/11/17  Yes [provider]  furosemide (LASIX) 20 MG tablet Take 20 mg by mouth daily as needed for fluid.   Yes [provider]  gabapentin (NEURONTIN) 600 MG tablet Take 600 mg by mouth 2 (two) times daily.   Yes [provider]  guaiFENesin (MUCINEX) 600 MG 12 hr tablet Take 600 mg by mouth daily.   Yes [provider]  HYDROcodone-acetaminophen (NORCO) 10-325 MG tablet Take 1 tablet by mouth every 6 (six) hours as needed for pain. 08/24/21  Yes [provider]  hydrOXYzine (ATARAX) 25 MG tablet Take 25 mg by mouth at bedtime as needed for sleep or anxiety. 03/05/21  Yes [provider]  ipratropium (ATROVENT) 0.02 % nebulizer solution Take 0.5 mg by nebulization 3 (three) times daily as needed for shortness of breath or wheezing. 01/05/21  Yes [provider]  levalbuterol (XOPENEX) 1.25 MG/3ML nebulizer solution Inhale 1.25 mg into the lungs every 8 (eight) hours as needed for shortness of breath or wheezing. 01/10/21  Yes [provider]  memantine (NAMENDA) 10 MG tablet Take 1 tablet (10 mg total) by mouth 2 (two) times daily. 02/13/21  Yes Hayden Pedro, PA-C  metoprolol succinate (TOPROL-XL) 25 MG 24 hr tablet Take 12.5 mg by mouth daily. 06/23/21  Yes [provider]  Multiple Vitamin (MULTIVITAMIN WITH MINERALS) TABS tablet Take 1 tablet by mouth daily. 07/10/21  Yes Sheikh, Omair Latif, DO  omeprazole (PRILOSEC) 20 MG capsule Take 20 mg by mouth daily before breakfast.   08/27/17  Yes [provider]  Polyethyl Glycol-Propyl Glycol (SYSTANE) 0.4-0.3 % GEL ophthalmic gel Place 1 Application into both eyes daily as needed (irritation).   Yes [provider]  polyethylene glycol (MIRALAX / GLYCOLAX) packet Take 17 g by mouth in the morning.   Yes [provider]  promethazine (PHENERGAN) 25 MG tablet Take 25 mg by mouth every 6 (six) hours as needed for nausea or vomiting.   Yes [provider]  PROTEIN PO Take 237 mLs by mouth daily.   Yes [provider]  rosuvastatin (CRESTOR) 5 MG tablet Take 5 mg by mouth daily.   Yes [provider]  sodium chloride 1 g tablet Take 1 g by mouth 2 (two) times daily. 06/23/21  Yes [provider]  escitalopram (LEXAPRO) 10 MG tablet Take 1 tablet (10 mg total) by mouth daily. Patient not taking: Reported on 09/05/2021 01/04/21   Flora Lipps, MD  feeding supplement (ENSURE ENLIVE / ENSURE PLUS) LIQD Take 237 mLs by mouth daily. Patient not taking: Reported on 09/09/2021 07/09/21   Raiford Noble Latif, DO  furosemide (LASIX) 20 MG tablet Take 1 tablet (20 mg total) by mouth daily. Take only if weight goes above 180 lbs. Patient taking differently: Take 20 mg by mouth daily as needed for fluid (weight gain of 3 - 4 lbs overnight). 02/21/21 09/05/21  Arrien, Jimmy Picket, MD  guaiFENesin-dextromethorphan (ROBITUSSIN DM) 100-10 MG/5ML syrup Take 10 mLs by mouth every 4 (four) hours as needed for cough. Patient not taking: Reported on 09/05/2021 01/03/21   Pokhrel, Corrie Mckusick, MD  lidocaine (LIDODERM) 5 % Place 1 patch onto the skin daily. Remove & Discard patch within 12 hours or as directed by MD Patient not taking: Reported on 09/05/2021 07/10/21   Raiford Noble Latif, DO  lidocaine (XYLOCAINE) 2 % solution Use as directed 15 mLs in the mouth or throat every 6 (six) hours as needed for mouth pain. Patient not taking: Reported on 07/06/2021 12/18/20   Sherwood Gambler, MD   linaclotide [facility]) 145 MCG CAPS capsule Take 1 capsule (145 mcg total) by mouth daily at 4 PM. Patient not taking: Reported on 09/09/2021 10/03/20   Debbe Odea, MD  magic mouthwash SOLN Take 5 mLs by mouth 4 (four) times daily as needed for mouth pain. Pt is allergic to Magnesium Patient not taking: Reported on 07/06/2021 11/04/20   Sherol Dade E, PA-C  magnesium oxide (MAG-OX) 400 (240 Mg) MG tablet Take 1 tablet (400 mg total) by mouth daily. Patient not taking: Reported on 07/06/2021 11/08/20   Heilingoetter, Cassandra L, PA-C  nystatin (MYCOSTATIN) 100000 UNIT/ML suspension Take 5 mLs (500,000 Units total) by mouth 4 (four) times daily. Patient not taking: Reported on 07/06/2021 12/16/20   Tyler Pita, MD  prochlorperazine (COMPAZINE) 10 MG tablet Take 1 tablet (10 mg total) by mouth every 6 (six) hours as needed for nausea or vomiting. Patient not taking: Reported on 09/09/2021 10/27/20   Curt Bears, MD  protein supplement (RESOURCE BENEPROTEIN) 6 g POWD Take 1 Scoop (6 g total) by mouth 3 (three) times daily with meals. Patient not taking: Reported on 09/05/2021 07/09/21   Raiford Noble Latif, DO  traMADol (ULTRAM) 50 MG tablet Take 50 mg by mouth 3 (three) times daily as needed for severe pain. 10/08/19   [provider]      Octavio Graves  Jatara Huettner, DO [facility] 09/13/2021 9:56 AM

## 2021-09-13 NOTE — Care Management Important Message (Signed)
Important Message  Patient Details  Name: Patricia [name] MRN: [id number] Date of Birth: Jan 05, 1951   Medicare Important Message Given:  Yes     Shelda Altes 09/13/2021, 7:46 AM

## 2021-09-14 ENCOUNTER — Ambulatory Visit: Admit: 2021-09-14 | Payer: Medicare HMO | Admitting: Neurosurgery

## 2021-09-14 DIAGNOSIS — S22060A Wedge compression fracture of T7-T8 vertebra, initial encounter for closed fracture: Secondary | ICD-10-CM | POA: Diagnosis not present

## 2021-09-14 DIAGNOSIS — C3432 Malignant neoplasm of lower lobe, left bronchus or lung: Secondary | ICD-10-CM | POA: Diagnosis not present

## 2021-09-14 DIAGNOSIS — J90 Pleural effusion, not elsewhere classified: Secondary | ICD-10-CM | POA: Diagnosis not present

## 2021-09-14 DIAGNOSIS — Z7189 Other specified counseling: Secondary | ICD-10-CM

## 2021-09-14 DIAGNOSIS — J9611 Chronic respiratory failure with hypoxia: Secondary | ICD-10-CM | POA: Diagnosis not present

## 2021-09-14 DIAGNOSIS — R7881 Bacteremia: Secondary | ICD-10-CM | POA: Diagnosis not present

## 2021-09-14 DIAGNOSIS — Z515 Encounter for palliative care: Secondary | ICD-10-CM

## 2021-09-14 LAB — COMPREHENSIVE METABOLIC PANEL
ALT: 28 U/L (ref 0–44)
AST: 29 U/L (ref 15–41)
Albumin: 2 g/dL — ABNORMAL LOW (ref 3.5–5.0)
Alkaline Phosphatase: 87 U/L (ref 38–126)
Anion gap: 13 (ref 5–15)
BUN: 27 mg/dL — ABNORMAL HIGH (ref 8–23)
CO2: 24 mmol/L (ref 22–32)
Calcium: 8.3 mg/dL — ABNORMAL LOW (ref 8.9–10.3)
Chloride: 97 mmol/L — ABNORMAL LOW (ref 98–111)
Creatinine, Ser: 0.79 mg/dL (ref 0.44–1.00)
GFR, Estimated: >60 mL/min (ref >60)
Glucose, Bld: 89 mg/dL (ref 70–99)
Potassium: 3.6 mmol/L (ref 3.5–5.1)
Sodium: 134 mmol/L — ABNORMAL LOW (ref 135–145)
Total Bilirubin: 0.8 mg/dL (ref 0.3–1.2)
Total Protein: 5.3 g/dL — ABNORMAL LOW (ref 6.5–8.1)

## 2021-09-14 LAB — CBC WITH DIFFERENTIAL/PLATELET
Abs Immature Granulocytes: 0.14 10*3/uL — ABNORMAL HIGH (ref 0.00–0.07)
Basophils Absolute: 0 10*3/uL (ref 0.0–0.1)
Basophils Relative: 0 %
Eosinophils Absolute: 0 10*3/uL (ref 0.0–0.5)
Eosinophils Relative: 0 %
HCT: 24.5 % — ABNORMAL LOW (ref 36.0–46.0)
Hemoglobin: 8.8 g/dL — ABNORMAL LOW (ref 12.0–15.0)
Immature Granulocytes: 1 %
Lymphocytes Relative: 2 %
Lymphs Abs: 0.2 10*3/uL — ABNORMAL LOW (ref 0.7–4.0)
MCH: 36.4 pg — ABNORMAL HIGH (ref 26.0–34.0)
MCHC: 35.9 g/dL (ref 30.0–36.0)
MCV: 101.2 fL — ABNORMAL HIGH (ref 80.0–100.0)
Monocytes Absolute: 0.7 10*3/uL (ref 0.1–1.0)
Monocytes Relative: 5 %
Neutro Abs: 11.5 10*3/uL — ABNORMAL HIGH (ref 1.7–7.7)
Neutrophils Relative %: 92 %
Platelets: 102 10*3/uL — ABNORMAL LOW (ref 150–400)
RBC: 2.42 MIL/uL — ABNORMAL LOW (ref 3.87–5.11)
RDW: 14.7 % (ref 11.5–15.5)
WBC: 12.6 10*3/uL — ABNORMAL HIGH (ref 4.0–10.5)
nRBC: 0 % (ref 0.0–0.2)

## 2021-09-14 LAB — PHOSPHORUS: Phosphorus: 3.4 mg/dL (ref 2.5–4.6)

## 2021-09-14 LAB — PATHOLOGIST SMEAR REVIEW

## 2021-09-14 LAB — MAGNESIUM: Magnesium: 1.9 mg/dL (ref 1.7–2.4)

## 2021-09-14 SURGERY — KYPHOPLASTY
Anesthesia: General

## 2021-09-14 MED ORDER — NALOXEGOL OXALATE 25 MG PO TABS
25.0000 mg | ORAL_TABLET | Freq: Every day | ORAL | Status: DC
  Administered 2021-09-15 – 2021-09-21 (×7): 25 mg via ORAL
  Filled 2021-09-14 (×8): qty 1

## 2021-09-14 MED ORDER — FLEET ENEMA 7-19 GM/118ML RE ENEM: 1.0000 | ENEMA | Freq: Once | RECTAL | Status: DC | PRN

## 2021-09-14 MED ORDER — HYDROMORPHONE HCL 1 MG/ML IJ SOLN
0.5000 mg | INTRAMUSCULAR | Status: DC | PRN
  Administered 2021-09-15 – 2021-09-22 (×17): 0.5 mg via INTRAVENOUS
  Filled 2021-09-14 (×17): qty 0.5

## 2021-09-14 MED ORDER — SORBITOL 70 % SOLN: 60.0000 mL | Freq: Once | Status: DC

## 2021-09-14 MED ORDER — SENNOSIDES-DOCUSATE SODIUM 8.6-50 MG PO TABS
1.0000 | ORAL_TABLET | Freq: Two times a day (BID) | ORAL | Status: DC
  Administered 2021-09-14 – 2021-09-22 (×7): 1 via ORAL
  Filled 2021-09-14 (×10): qty 1

## 2021-09-14 MED ORDER — FLEET ENEMA 7-19 GM/118ML RE ENEM
1.0000 | ENEMA | Freq: Once | RECTAL | Status: AC
Start: 2021-09-14 — End: 2021-09-14
  Administered 2021-09-14: 1 via RECTAL
  Filled 2021-09-14: qty 1

## 2021-09-14 NOTE — Progress Notes (Signed)
SLP Cancellation Note  Patient Details Name: Xcaret Morad MRN: [id number] DOB: [birth year]   Cancelled treatment:       Reason Eval/Treat Not Completed: Other (comment) (Pt has been NPO due to ileus and pt is currently considering d/c with hospice care. Considering pt's functional swallow mechanism during the eval on 9/3, pt may resume the previously recommended diet (regular/thin) when clinically indicated. SLP will sign off at this time, but please reconsult if warranted.)  Breleigh Carpino I. Hardin Negus, [place], [place] Office number [phone]  Horton Marshall 09/14/2021, 10:31 AM

## 2021-09-14 NOTE — Progress Notes (Addendum)
PROGRESS NOTE    Patricia [name]  XTG:[id number] DOB: Sep 21, 1950 DOA: 09/09/2021 PCP: [name], MD   Brief Narrative: 71 year old with past medical history significant for small cell lung cancer on the left side, A-fib, recent vertebral fracture awaiting kyphoplasty, pleural effusion status postthoracentesis the day prior to admission.  She presented to the ED complaining of worsening shortness of breath and with altered mental status.  After she had thoracentesis, she went home family had difficulty waking her up.  She took some extra sleeping medicine, Klonopin, Soma, Atarax.  Patient was found to be hypoxic with oxygen saturation 85 room air at home.  Evaluation in the ED she was tachycardic, she was satting 94% on 4 L.  CT chest showed moderate left pleural effusion with near complete consolidation of the left lower lobe and partial consolidation of the lingula, interval resolution of previously seen right pleural effusion.  Admitted with acute hypoxic respiratory failure in the setting of pleural effusion. Neurologist was consulted and patient underwent thoracentesis yielding 200 cc of clear but hemorrhagic fluid.  Patient found to have parapneumonic effusion.  Dr. Katy Fitch discussed with patient, patient would like to proceed with more comfort care approach.   Palliative care consulted, evaluation for residential hospice.   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  Total nucleated cell 2,650. Cytology:  WBC trending down.  Patient had a long conversation with Dr. Katy Fitch she is interested in  hospice care.   If patient and family wishes to proceed with further treatment she will require a chest tube placement and fibrinolysis. Patient would like hospice care.  Palliative care consulted.   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   Small cell carcinoma of lower lobe on the left Status post chemoradiation Oncology concerned about enlarged pulmonary lymph nodes. Possible metastasis to LN.- outpatient PET/CT  Dysarthria: Initial CT head negative Patient is stop her Eliquis 48 hours ago in preparation for kyphoplasty for her vertebral fracture. Patient with nasal quality to voice, not her usual speech pattern. MRI; 9/3: No acute intracranial abnormality.  Mild sequela of whole brain radiation with noted strong evidence of metastatic disease in this noncontrast exam.  Fracture of seventh thoracic vertebra Is scheduled for kyphoplasty take for this week. With active infection, no safe to proceed with kyphoplasty  A-fib: Holding Eliquis, awaiting decision for chest tube.  Hypertension: Continue with Toprol  Constipation, SBO vs Ileus;  Patient remove NGT .  Patient has soft mushy BM two day ago.  NPO KUB less small bowel dilation, stool thorough out colon.  Plan to do fleet enema and start senna.   Hypokalemia; replaced.   Hypomagnesemia. Allergies to magnesium  Mild hyponatremia; monitor.   Chronic Diastolic HF; compensated,.   Estimated body mass index is 19.79 kg/m as calculated from the following:   Height as of this encounter: 5\' 7"  (1.702 m).   Weight as of this encounter: 57.3 kg.  DVT prophylaxis: Heparin  Code Status: DNR Family Communication: husband at bedside.  Disposition Plan:  Status is: Inpatient Remains inpatient appropriate because: management  pleural effusion, Ileus.     Consultants:  Pulmonology   Procedures:  9/1 Thoracentesis: 9/4 spoke at length with lab and they never received sample for processing 9/2 CT chest W0 contrast -Overall no significant interval change in the appearance of the lungs since the prior CT of 09/04/2021. -Moderate left pleural effusion with near complete consolidation LLL/partial consolidation  lingula similar to prior CT. -Post radiation changes LUL similar to prior CT. -Interval resolution  right pleural effusion. -Fractures of the posterolateral ninth and eleventh rib on right.  No new fracture. 9/4 DG CXR: -interval complete opacification left hemithorax suggesting atelectasis in left lung, possibly due to central obstructing mucous plug. Part of this opacity is due to left pleural effusion. -Small patchy infiltrate in right lower lung field may suggest atelectasis/pneumonia. There is no pneumothorax. 9/4 KUB There is increased amount of gas which appears to be mostly in the colon suggesting possible ileus or distal colonic obstruction.  9/5 LEFT Thoracentesis 244ml Aspirated  Antimicrobials:    Subjective: She is complaining of constipation. .  No passing gas.  Denies worsening dyspnea.   Objective: Vitals:   09/14/21 0446 09/14/21 0500 09/14/21 0808 09/14/21 1111  BP: 126/73   128/88  Pulse: 84   87  Resp: (!) 25   (!) 23  Temp: 97.7 F (36.5 C)   98.3 F (36.8 C)  TempSrc: Oral   Oral  SpO2: 98%  97% 97%  Weight:  57.3 kg    Height:        Intake/Output Summary (Last 24 hours) at 09/14/2021 1414 Last data filed at 09/14/2021 1121 Gross per 24 hour  Intake 799.72 ml  Output 550 ml  Net 249.72 ml    Filed Weights   09/12/21 0433 09/13/21 0453 09/14/21 0500  Weight: 44 kg 57 kg 57.3 kg    Examination:  General exam: NAD Respiratory system: BL air movement.  Cardiovascular system: S 1, S 2 RRR Gastrointestinal system: BS present, soft, nt Central nervous system:  alert Extremities: no edema    Data Reviewed: I have personally reviewed following labs and imaging studies  CBC: Recent Labs  Lab 09/10/21 0242 09/11/21 0229 09/12/21 0254 09/13/21 0735 09/14/21 0335  WBC 16.2* 12.8* 14.3* 13.8* 12.6*  NEUTROABS 15.0* 11.9* 13.1* 12.7* 11.5*  HGB 11.3* 10.2* 10.3* 9.6* 8.8*  HCT 31.4* 28.4* 28.7* 26.7* 24.5*  MCV 102.6* 102.9* 102.5* 103.5* 101.2*  PLT 132* 122* 128* 113* 102*    Basic Metabolic Panel: Recent Labs  Lab 09/10/21 0242 09/11/21 0229 09/12/21 0254 09/13/21 0735 09/14/21 0335  NA 131* 131* 130* 134* 134*  K 3.4* 4.0 4.1 3.7 3.6  CL 93* 97* 96* 97* 97*  CO2 24 24 25 25 24   GLUCOSE 111* 97 113* 98 89  BUN 25* 30* 28* 26* 27*  CREATININE 1.03* 0.82 0.67 0.76 0.79  CALCIUM 8.8* 8.6* 8.5* 8.4* 8.3*  MG 1.6* 2.1 1.8 1.7 1.9  PHOS  --  3.2 2.7 3.2 3.4    GFR: Estimated Creatinine Clearance: 57.5 mL/min (by C-G formula based on SCr of 0.79 mg/dL). Liver Function Tests: Recent Labs  Lab 09/10/21 0242 09/11/21 0229 09/12/21 0254 09/13/21 0735 09/14/21 0335  AST 41 29 18 32 29  ALT 47* 40 30 29 28   ALKPHOS 71 71 77 89 87  BILITOT 0.3 0.5 0.6 0.7  0.8  PROT 5.8* 5.5* 5.6* 5.3* 5.3*  ALBUMIN 2.8* 2.4* 2.2* 2.0* 2.0*    No results for input(s): "LIPASE", "AMYLASE" in the last 168 hours. Recent Labs  Lab 09/09/21 1520 09/11/21 1638  AMMONIA 19 17    Coagulation Profile: No results for input(s): "INR", "PROTIME" in the last 168 hours. Cardiac Enzymes: No results for input(s): "CKTOTAL", "CKMB", "CKMBINDEX", "TROPONINI" in the last 168 hours. BNP (last 3 results) No results for input(s): "PROBNP" in the last 8760 hours. HbA1C: No results for input(s): "HGBA1C" in the last 72 hours. CBG: Recent Labs  Lab 09/09/21 1557 09/09/21 1915  GLUCAP 142* 106*    Lipid Profile: No results for input(s): "CHOL", "HDL", "LDLCALC", "TRIG", "CHOLHDL", "LDLDIRECT" in the last 72 hours. Thyroid Function Tests: No  results for input(s): "TSH", "T4TOTAL", "FREET4", "T3FREE", "THYROIDAB" in the last 72 hours. Anemia Panel: No results for input(s): "VITAMINB12", "FOLATE", "FERRITIN", "TIBC", "IRON", "RETICCTPCT" in the last 72 hours. Sepsis Labs: Recent Labs  Lab 09/09/21 1515 09/11/21 1638 09/11/21 1821  PROCALCITON 1.23  --   --   LATICACIDVEN  --  1.5 1.4     Recent Results (from the past 240 hour(s))  Surgical pcr screen     Status: None   Collection Time: 09/07/21  2:49 PM   Specimen: Nasal Mucosa; Nasal Swab  Result Value Ref Range Status   MRSA, PCR NEGATIVE NEGATIVE Final   Staphylococcus aureus NEGATIVE NEGATIVE Final    Comment: (NOTE) The Xpert SA Assay (FDA approved for NASAL specimens in patients 19 years of age and older), is one component of a comprehensive surveillance program. It is not intended to diagnose infection nor to guide or monitor treatment. Performed at [facility], [place] [address], [place], [place] [address]   Blood culture (routine x 2)     Status: Abnormal   Collection Time: 09/09/21  3:17 PM   Specimen: BLOOD  Result Value Ref Range Status   Specimen Description BLOOD LEFT ANTECUBITAL  Final   Special Requests   Final    BOTTLES DRAWN AEROBIC AND ANAEROBIC Blood Culture adequate volume   Culture  Setup Time   Final    GRAM NEGATIVE RODS IN BOTH AEROBIC AND ANAEROBIC BOTTLES CRITICAL VALUE NOTED.  VALUE IS CONSISTENT WITH PREVIOUSLY REPORTED AND CALLED VALUE.    Culture (A)  Final    PASTEURELLA MULTOCIDA Usually susceptible to penicillin and other beta lactam agents,quinolones,macrolides and tetracyclines. HEALTH DEPARTMENT NOTIFIED Performed at [facility], [place] [address] [place], [place] [address]    Report Status 09/12/2021 FINAL  Final  Blood culture (routine x 2)     Status: Abnormal   Collection Time: 09/09/21  3:22 PM   Specimen: BLOOD  Result Value Ref Range Status   Specimen Description BLOOD RIGHT ANTECUBITAL  Final    Special Requests   Final    BOTTLES DRAWN AEROBIC AND ANAEROBIC Blood Culture adequate volume   Culture  Setup Time   Final    GRAM NEGATIVE RODS IN BOTH AEROBIC AND ANAEROBIC BOTTLES CRITICAL RESULT CALLED TO, READ BACK BY AND VERIFIED WITH: PHARMD HEATHER [id number] FCP    Culture (A)  Final    PASTEURELLA MULTOCIDA Usually susceptible to penicillin and other beta lactam agents,quinolones,macrolides and tetracyclines. HEALTH DEPARTMENT NOTIFIED Performed at [facility], [place] [address], [place],  [address]    Report Status 09/12/2021 FINAL  Final  Blood Culture ID Panel (Reflexed)  Status: None   Collection Time: 09/09/21  3:22 PM  Result Value Ref Range Status   Enterococcus faecalis NOT DETECTED NOT DETECTED Final   Enterococcus Faecium NOT DETECTED NOT DETECTED Final   Listeria monocytogenes NOT DETECTED NOT DETECTED Final    Comment: CRITICAL RESULT CALLED TO, READ BACK BY AND VERIFIED WITH: PHARMD HEATHER W [id number] FCP    Staphylococcus species NOT DETECTED NOT DETECTED Final   Staphylococcus aureus (BCID) NOT DETECTED NOT DETECTED Final   Staphylococcus epidermidis NOT DETECTED NOT DETECTED Final   Staphylococcus lugdunensis NOT DETECTED NOT DETECTED Final   Streptococcus species NOT DETECTED NOT DETECTED Final   Streptococcus agalactiae NOT DETECTED NOT DETECTED Final   Streptococcus pneumoniae NOT DETECTED NOT DETECTED Final   Streptococcus pyogenes NOT DETECTED NOT DETECTED Final   A.calcoaceticus-baumannii NOT DETECTED NOT DETECTED Final   Bacteroides fragilis NOT DETECTED NOT DETECTED Final   Enterobacterales NOT DETECTED NOT DETECTED Final   Enterobacter cloacae complex NOT DETECTED NOT DETECTED Final   Escherichia coli NOT DETECTED NOT DETECTED Final   Klebsiella aerogenes NOT DETECTED NOT DETECTED Final   Klebsiella oxytoca NOT DETECTED NOT DETECTED Final   Klebsiella pneumoniae NOT DETECTED NOT DETECTED Final   Proteus species NOT  DETECTED NOT DETECTED Final   Salmonella species NOT DETECTED NOT DETECTED Final   Serratia marcescens NOT DETECTED NOT DETECTED Final   Haemophilus influenzae NOT DETECTED NOT DETECTED Final   Neisseria meningitidis NOT DETECTED NOT DETECTED Final   Pseudomonas aeruginosa NOT DETECTED NOT DETECTED Final   Stenotrophomonas maltophilia NOT DETECTED NOT DETECTED Final   Candida albicans NOT DETECTED NOT DETECTED Final   Candida auris NOT DETECTED NOT DETECTED Final   Candida glabrata NOT DETECTED NOT DETECTED Final   Candida krusei NOT DETECTED NOT DETECTED Final   Candida parapsilosis NOT DETECTED NOT DETECTED Final   Candida tropicalis NOT DETECTED NOT DETECTED Final   Cryptococcus neoformans/gattii NOT DETECTED NOT DETECTED Final    Comment: Performed at [facility], [address], [place], [place] [address]  Expectorated Sputum Assessment w Gram Stain, Rflx to Resp Cult     Status: None   Collection Time: 09/10/21  1:31 PM   Specimen: Sputum  Result Value Ref Range Status   Specimen Description SPU  Final   Special Requests NONE  Final   Sputum evaluation   Final    Sputum specimen not acceptable for testing.  Please recollect.   Gram Stain Report Called to,Read Back By and Verified With: RN Mortimer Fries [phone] @2114  FH Performed at [facility], [place] [address], [place], [place] [address]    Report Status 09/12/2021 FINAL  Final  Urine Culture     Status: None   Collection Time: 09/11/21 10:10 AM   Specimen: Urine, Catheterized  Result Value Ref Range Status   Specimen Description URINE, CATHETERIZED  Final   Special Requests Normal  Final   Culture   Final    NO GROWTH Performed at [place] [address], [place], [place] [address]    Report Status 09/12/2021 FINAL  Final  Body fluid culture w Gram Stain     Status: None (Preliminary result)   Collection Time: 09/12/21  3:30 PM   Specimen: Pleural Fluid  Result Value Ref Range Status   Specimen  Description PLEURAL  Final   Special Requests NONE  Final   Gram Stain PENDING  Incomplete   Culture   Final  NO GROWTH 2 DAYS Performed at [facility], [place] [address], [place], [place] [address]    Report Status PENDING  Incomplete         Radiology Studies: DG Abd 1 View  Result Date: 09/13/2021 CLINICAL DATA:  Ileus EXAM: ABDOMEN - 1 VIEW COMPARISON:  09/11/2021 FINDINGS: Large stool burden throughout the colon. No evidence of bowel obstruction. Gas within mildly prominent large and small bowel, likely mild ileus, improved since prior study. No organomegaly or free air. IMPRESSION: Improving gaseous distention of bowel, likely improving ileus. Large stool burden throughout the colon. Electronically Signed   By: Rolm Baptise M.D.   On: 09/13/2021 16:11   DG CHEST PORT 1 VIEW  Result Date: 09/12/2021 CLINICAL DATA:  Atelectasis of left lung EXAM: PORTABLE CHEST 1 VIEW COMPARISON:  Previous studies including the examination of 09/11/2021 FINDINGS: There is partial improvement in aeration in left upper and left mid lung fields. These continued opacification in the left lower lung field. There are faint radiopacities in the periphery of left upper lung field, possibly suggesting loculated effusions. There is blunting of right lateral costophrenic angle. Increased interstitial markings are seen in right lower lung field. There is no pneumothorax. IMPRESSION: There is interval partial improvement in aeration in left lung suggesting decrease in atelectasis. Still, there is complete opacification of left lower lung field and partial opacification of left upper lung fields suggesting residual atelectasis/pneumonia and pleural effusion. Increased interstitial markings in right lower lung fields suggest interstitial pneumonia. Small right pleural effusion. Electronically Signed   By: Elmer Picker M.D.   On: 09/12/2021 16:24        Scheduled Meds:  amiodarone  200 mg Oral Daily    Chlorhexidine Gluconate Cloth  6 each Topical Daily   escitalopram  10 mg Oral Daily   guaiFENesin  1,200 mg Oral BID   heparin  5,000 Units Subcutaneous Q8H   ipratropium-albuterol  3 mL Nebulization BID   metoprolol succinate  25 mg Oral Daily   pantoprazole (PROTONIX) IV  40 mg Intravenous Q12H   rosuvastatin  5 mg Oral Daily   senna-docusate  1 tablet Oral BID   sodium chloride HYPERTONIC  4 mL Nebulization BID   sodium phosphate  1 enema Rectal Once   Continuous Infusions:  sodium chloride 10 mL/hr at 09/11/21 1004   ampicillin-sulbactam (UNASYN) IV 3 g (09/14/21 1121)   methocarbamol (ROBAXIN) IV       LOS: 5 days    Time spent: 35 minutes.    Elmarie Shiley, MD [facility]   If 7PM-7AM, please contact night-coverage [email or web address]  09/14/2021, 2:14 PM

## 2021-09-14 NOTE — Progress Notes (Signed)
NAME:  Patricia [name], MRN:  [id number], DOB:  July 18, 1950, LOS: 5 ADMISSION DATE:  09/09/2021, CONSULTATION DATE:  09/14/21 REFERRING MD:  Sherral Hammers, CHIEF COMPLAINT:  pleural effusion   History of Present Illness:  Patricia [name] is a 71 y.o. 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  CT chest was done which showed L pleural effusion and enlarged subcarinal lymph node.  She saw her oncologist Dr. Earlie Server on 8/29 and was sent for thoracentesis on 9/1.  750cc straw colored fluid withdrawn before severe coughing aborted procedure.  The next day on 9/2 she presented with worsened mental status and hypoxia to 85% on baseline home 3L O2.   CXR showed re-accumulation of L pleural effusion, WBC elevated to 16k and she was admitted to the hospitalists service.  Repeat CT chest with repeated L pleural effusion and lingula consolidation.  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   IR was consulted for repeat thora, however noted densely loculated effusions with septations so opted not to proceed.   PCCM consulted for possible chest tube.  Cytology pending from prior thoracentesis.  Pt reports feeling "terrible" but is on 4L and states her breathing is about the same.  She has pain in her ribs and back with coughing, feels weak and nauseated.  She states that if her cancer has returned, she would not be interested in pursuing further chemo/radiation.  Pertinent  Medical History   has a past medical history of Anemia, Anxiety, GERD (gastroesophageal reflux disease), Hyperlipidemia, Hypertension, IBS (irritable bowel syndrome), Insomnia, Low back pain, Lung cancer ([place]) (10/2020), TIA (transient  ischemic attack) (10/2017), Vitamin B12 deficiency, and Vitamin D deficiency. Small cell lung cancer    Significant Hospital Events: Including procedures, antibiotic start and stop dates in addition to other pertinent events   9/1 outpatient thoracentesis by IR 9/2 presented to the ED with AMS and dyspnea 9/5 PCCM consult  Interim History / Subjective:   Weak. Sore. Tired. But she feels better than yesterday   Objective   Blood pressure 126/73, pulse 84, temperature 97.7 F (36.5 C), temperature source Oral, resp. rate (!) 25, height 5\' 7"  (1.702 m), weight 57.3 kg, SpO2 97 %.        Intake/Output Summary (Last 24 hours) at 09/14/2021 0919 Last data filed at 09/14/2021 0556 Gross per 24 hour  Intake 799.72 ml  Output 700 ml  Net 99.72 ml   Filed Weights   09/12/21 0433 09/13/21 0453 09/14/21 0500  Weight: 44 kg 57 kg 57.3 kg   General: chronically ill appearing fm, resting in bed  HEENT: MMM, NCAT  Neuro: AAOx3  CV: RRR, s1s2 PULM: diminished breath sounds bilaterally  GI: soft nt nd  Extremities: warm dry no edema  Skin:  no rash   Resolved Hospital Problem list    Assessment & Plan:   Left-sided parapneumonic effusion Small cell lung Ca (2022) treated with chemo/radiation with enlarged subcarinal lymph node  Pasturella Multocida bacteremia  Plan:  Long discussion at bedside with patient and husband  She does not want any additional procedures  I think she need prolonged ABX  At least 21 days of augmentin s/p discharge  I think her best option is  discharge with inpatient hospice care at a facility I do not think her husband will be able to care for her at home  They are agreeable.  Appreciate palliative input   I will reach out to her primary doctor, Dr. Tyrell Antonio I have also placed a referral to palliative care.  Best Practice (right click and "Reselect all SmartList Selections" daily)   Per primary  Labs   CBC: Recent Labs  Lab 09/10/21 0242  09/11/21 0229 09/12/21 0254 09/13/21 0735 09/14/21 0335  WBC 16.2* 12.8* 14.3* 13.8* 12.6*  NEUTROABS 15.0* 11.9* 13.1* 12.7* 11.5*  HGB 11.3* 10.2* 10.3* 9.6* 8.8*  HCT 31.4* 28.4* 28.7* 26.7* 24.5*  MCV 102.6* 102.9* 102.5* 103.5* 101.2*  PLT 132* 122* 128* 113* 102*    Basic Metabolic Panel: Recent Labs  Lab 09/10/21 0242 09/11/21 0229 09/12/21 0254 09/13/21 0735 09/14/21 0335  NA 131* 131* 130* 134* 134*  K 3.4* 4.0 4.1 3.7 3.6  CL 93* 97* 96* 97* 97*  CO2 24 24 25 25 24   GLUCOSE 111* 97 113* 98 89  BUN 25* 30* 28* 26* 27*  CREATININE 1.03* 0.82 0.67 0.76 0.79  CALCIUM 8.8* 8.6* 8.5* 8.4* 8.3*  MG 1.6* 2.1 1.8 1.7 1.9  PHOS  --  3.2 2.7 3.2 3.4   GFR: Estimated Creatinine Clearance: 57.5 mL/min (by C-G formula based on SCr of 0.79 mg/dL). Recent Labs  Lab 09/09/21 1515 09/10/21 0242 09/11/21 0229 09/11/21 1638 09/11/21 1821 09/12/21 0254 09/13/21 0735 09/14/21 0335  PROCALCITON 1.23  --   --   --   --   --   --   --   WBC 16.9*   < > 12.8*  --   --  14.3* 13.8* 12.6*  LATICACIDVEN  --   --   --  1.5 1.4  --   --   --    < > = values in this interval not displayed.    Liver Function Tests: Recent Labs  Lab 09/10/21 0242 09/11/21 0229 09/12/21 0254 09/13/21 0735 09/14/21 0335  AST 41 29 18 32 29  ALT 47* 40 30 29 28   ALKPHOS 71 71 77 89 87  BILITOT 0.3 0.5 0.6 0.7 0.8  PROT 5.8* 5.5* 5.6* 5.3* 5.3*  ALBUMIN 2.8* 2.4* 2.2* 2.0* 2.0*   No results for input(s): "LIPASE", "AMYLASE" in the last 168 hours. Recent Labs  Lab 09/09/21 1520 09/11/21 1638  AMMONIA 19 17    ABG    Component Value Date/Time   PHART 7.44 09/11/2021 1630   PCO2ART 43 09/11/2021 1630   PO2ART 81 (L) 09/11/2021 1630   HCO3 29.2 (H) 09/11/2021 1630   TCO2 32 09/09/2021 1524   O2SAT 98.2 09/11/2021 1630     Coagulation Profile: No results for input(s): "INR", "PROTIME" in the last 168 hours.  Cardiac Enzymes: No results for input(s): "CKTOTAL", "CKMB",  "CKMBINDEX", "TROPONINI" in the last 168 hours.  HbA1C: No results found for: "HGBA1C"  CBG: Recent Labs  Lab 09/09/21 1557 09/09/21 1915  GLUCAP 142* 106*    Review of Systems:   Please see the history of present illness. All other systems reviewed and are negative    Past Medical History:  She,  has a past medical history of Anemia, Anxiety, GERD (gastroesophageal reflux disease), Hyperlipidemia, Hypertension, IBS (irritable bowel syndrome), Insomnia, Low back pain, Lung cancer ([place]) (10/2020), TIA (transient ischemic attack) (10/2017), Vitamin B12 deficiency, and Vitamin D deficiency.   Surgical History:   Past Surgical History:  Procedure Laterality Date   BACK SURGERY     BREAST EXCISIONAL BIOPSY Left 1997   benign   BRONCHIAL BIOPSY  10/18/2020   Procedure: BRONCHIAL BIOPSIES;  Surgeon: [name], DO;  Location: [facility];  Service: Pulmonary;;   BRONCHIAL BRUSHINGS  10/18/2020   Procedure: BRONCHIAL BRUSHINGS;  Surgeon: [name], DO;  Location: [facility];  Service: Pulmonary;;   BRONCHIAL NEEDLE ASPIRATION BIOPSY  10/18/2020   Procedure: BRONCHIAL NEEDLE ASPIRATION BIOPSIES;  Surgeon: [name], DO;  Location: [facility];  Service: Pulmonary;;   BRONCHIAL WASHINGS  10/18/2020   Procedure: BRONCHIAL WASHINGS;  Surgeon: [name], DO;  Location: [facility];  Service: Pulmonary;;   CARDIOVERSION N/A 02/13/2021   Procedure: CARDIOVERSION;  Surgeon: Werner Lean, MD;  Location: [facility];  Service: Cardiovascular;  Laterality: N/A;   CARDIOVERSION N/A 02/20/2021   Procedure: CARDIOVERSION;  Surgeon: Skeet Latch, MD;  Location: [facility];  Service: Cardiovascular;  Laterality: N/A;   CARDIOVERSION N/A 03/22/2021   Procedure: CARDIOVERSION;  Surgeon: Freada Bergeron, MD;  Location[facility];  Service: Cardiovascular;  Laterality: N/A;   hemorrhoidecotmy     HERNIA MESH REMOVAL     OPEN REDUCTION INTERNAL  FIXATION (ORIF) DISTAL RADIAL FRACTURE Right 11/19/2019   Procedure: OPEN REDUCTION INTERNAL FIXATION (ORIF) DISTAL RADIUS AND ULNA FRACTURE WITH REPAIR AS NECESSARY;  Surgeon: Roseanne Kaufman, MD;  Location: [name];  Service: Orthopedics;  Laterality: Right;  2 hrs Block with IV sedation   ORIF RADIUS & ULNA FRACTURES     TONSILLECTOMY     VIDEO BRONCHOSCOPY WITH ENDOBRONCHIAL NAVIGATION Left 10/18/2020   Procedure: VIDEO BRONCHOSCOPY WITH ENDOBRONCHIAL NAVIGATION;  Surgeon: [name], DO;  Location: [place];  Service: Pulmonary;  Laterality: Left;  ION   VIDEO BRONCHOSCOPY WITH ENDOBRONCHIAL ULTRASOUND Bilateral 10/18/2020   Procedure: VIDEO BRONCHOSCOPY WITH ENDOBRONCHIAL ULTRASOUND;  Surgeon: [name], DO;  Location: [place];  Service: Pulmonary;  Laterality: Bilateral;   VIDEO BRONCHOSCOPY WITH RADIAL ENDOBRONCHIAL ULTRASOUND  10/18/2020   Procedure: VIDEO BRONCHOSCOPY WITH RADIAL ENDOBRONCHIAL ULTRASOUND;  Surgeon: [name], DO;  Location: [facility];  Service: Pulmonary;;     Social History:   reports that she quit smoking about 21 months ago. Her smoking use included cigarettes. She started smoking about 51 years ago. She has a 50.00 pack-year smoking history. She has never used smokeless tobacco. She reports that she does not currently use alcohol. She reports that she does not use drugs.   Family History:  Her family history includes Breast cancer in her maternal aunt, paternal aunt, and sister.   Allergies Allergies  Allergen Reactions   Amoxicillin-Pot Clavulanate Nausea Only and Other (See Comments)    GI upset    Doxycycline Swelling    Oral swelling   Levaquin [Levofloxacin In D5w] Other (See Comments)    Joint problems    Magnesium Hydroxide Other (See Comments)    Welts    Oxycodone Nausea Only   Other Itching    Paper tape     Home Medications  Prior to Admission medications   Medication Sig Start Date End Date Taking?  Authorizing Provider  acetaminophen (TYLENOL) 325 MG tablet Take 650 mg by mouth every 6 (six) hours as needed for headache or moderate pain.   Yes [provider]  albuterol (VENTOLIN HFA) 108 (90 Base) MCG/ACT inhaler Inhale 2 puffs into the lungs every 6 (six) hours as needed for shortness of breath. 03/04/21  Yes [provider]  amiodarone (PACERONE) 200 MG tablet Take 1 tablet (200 mg total) by mouth daily. 04/24/21  Yes Camnitz, Will Hassell Done, MD  apixaban (ELIQUIS) 5 MG TABS tablet TAKE 1 TABLET(5 MG) BY MOUTH TWICE DAILY 06/12/21  Yes Crenshaw, Denice Bors, MD  Black Cohosh (REMIFEMIN PO) Take 1 tablet by mouth 2 (two) times daily.   Yes [provider]  carboxymethylcellulose (REFRESH PLUS) 0.5 % SOLN Place 1 drop into both eyes 3 (three) times daily as needed (for dryness).    Yes [provider]  carisoprodol (SOMA) 350 MG tablet Take 350 mg by mouth 3 (three) times daily as needed for muscle spasms.   Yes [provider]  Cholecalciferol (VITAMIN D3) 25 MCG (1000 UT) CHEW Chew 1 tablet by mouth daily.   Yes [provider]  clobetasol ointment (TEMOVATE) 8.78 % Apply 1 application. topically daily as needed (vaginal irritation).   Yes [provider]  clonazePAM (KLONOPIN) 1 MG tablet Take 1 mg by mouth at bedtime.  10/15/17  Yes [provider]  Coenzyme Q10 200 MG capsule Take 400 mg by mouth daily.   Yes [provider]  Cyanocobalamin (B-12 PO) Take 2 capsules by mouth daily.   Yes [provider]  docusate sodium (COLACE) 100 MG capsule Take 1 capsule (100 mg total) by mouth 2 (two) times daily. Patient taking differently: Take 300 mg by mouth daily. 01/03/21  Yes Pokhrel, Laxman, MD  escitalopram (LEXAPRO) 20 MG tablet Take 20 mg by mouth daily. 08/15/21  Yes [provider]  fluticasone (FLONASE) 50 MCG/ACT nasal spray Place 2 sprays into both nostrils daily as needed for allergies. 10/11/17  Yes  [provider]  furosemide (LASIX) 20 MG tablet Take 20 mg by mouth daily as needed for fluid.   Yes [provider]  gabapentin (NEURONTIN) 600 MG tablet Take 600 mg by mouth 2 (two) times daily.   Yes [provider]  guaiFENesin (MUCINEX) 600 MG 12 hr tablet Take 600 mg by mouth daily.   Yes [provider]  HYDROcodone-acetaminophen (NORCO) 10-325 MG tablet Take 1 tablet by mouth every 6 (six) hours as needed for pain. 08/24/21  Yes [provider]  hydrOXYzine (ATARAX) 25 MG tablet Take 25 mg by mouth at bedtime as needed for sleep or anxiety. 03/05/21  Yes [provider]  ipratropium (ATROVENT) 0.02 % nebulizer solution Take 0.5 mg by nebulization 3 (three) times daily as needed for shortness of breath or wheezing. 01/05/21  Yes [provider]  levalbuterol (XOPENEX) 1.25 MG/3ML nebulizer solution Inhale 1.25 mg into the lungs every 8 (eight) hours as needed for shortness of breath or wheezing. 01/10/21  Yes [provider]  memantine (NAMENDA) 10 MG tablet Take 1 tablet (10 mg total) by mouth 2 (two) times daily. 02/13/21  Yes Hayden Pedro, PA-C  metoprolol succinate (TOPROL-XL) 25 MG 24 hr tablet Take 12.5 mg by mouth daily. 06/23/21  Yes [provider]  Multiple Vitamin (MULTIVITAMIN WITH MINERALS) TABS tablet Take 1 tablet by mouth daily. 07/10/21  Yes Sheikh, Omair Latif, DO  omeprazole (PRILOSEC) 20 MG capsule Take 20 mg by mouth daily before breakfast.  08/27/17  Yes [provider]  Polyethyl Glycol-Propyl Glycol (SYSTANE) 0.4-0.3 % GEL ophthalmic gel Place 1 Application into both eyes daily as needed (irritation).   Yes [provider]  polyethylene glycol (MIRALAX / GLYCOLAX) packet Take 17 g by mouth in the morning.  Yes [provider]  promethazine (PHENERGAN) 25 MG tablet Take 25 mg by mouth every 6 (six) hours as needed for nausea or vomiting.   Yes [provider]  PROTEIN PO Take 237 mLs by mouth daily.   Yes [provider]  rosuvastatin (CRESTOR) 5 MG tablet Take 5 mg by mouth daily.   Yes [provider]  sodium chloride 1 g tablet Take 1 g by mouth 2 (two) times daily. 06/23/21  Yes [provider]  escitalopram (LEXAPRO) 10 MG tablet Take 1 tablet (10 mg total) by mouth daily. Patient not taking: Reported on 09/05/2021 01/04/21   Flora Lipps, MD  feeding supplement (ENSURE ENLIVE / ENSURE PLUS) LIQD Take 237 mLs by mouth daily. Patient not taking: Reported on 09/09/2021 07/09/21   Raiford Noble Latif, DO  furosemide (LASIX) 20 MG tablet Take 1 tablet (20 mg total) by mouth daily. Take only if weight goes above 180 lbs. Patient taking differently: Take 20 mg by mouth daily as needed for fluid (weight gain of 3 - 4 lbs overnight). 02/21/21 09/05/21  Arrien, Jimmy Picket, MD  guaiFENesin-dextromethorphan (ROBITUSSIN DM) 100-10 MG/5ML syrup Take 10 mLs by mouth every 4 (four) hours as needed for cough. Patient not taking: Reported on 09/05/2021 01/03/21   Pokhrel, Corrie Mckusick, MD  lidocaine (LIDODERM) 5 % Place 1 patch onto the skin daily. Remove & Discard patch within 12 hours or as directed by MD Patient not taking: Reported on 09/05/2021 07/10/21   Raiford Noble Latif, DO  lidocaine (XYLOCAINE) 2 % solution Use as directed 15 mLs in the mouth or throat every 6 (six) hours as needed for mouth pain. Patient not taking: Reported on 07/06/2021 12/18/20   Sherwood Gambler, MD  linaclotide [facility]) 145 MCG CAPS capsule Take 1 capsule (145 mcg total) by mouth daily at 4 PM. Patient not taking: Reported on 09/09/2021 10/03/20   Debbe Odea, MD  magic mouthwash SOLN Take 5 mLs by mouth 4 (four) times daily as needed for mouth pain. Pt is allergic to Magnesium Patient not taking: Reported on 07/06/2021 11/04/20   Sherol Dade E, PA-C  magnesium oxide (MAG-OX) 400 (240 Mg) MG tablet Take 1 tablet (400 mg total) by mouth daily. Patient  not taking: Reported on 07/06/2021 11/08/20   Heilingoetter, Cassandra L, PA-C  nystatin (MYCOSTATIN) 100000 UNIT/ML suspension Take 5 mLs (500,000 Units total) by mouth 4 (four) times daily. Patient not taking: Reported on 07/06/2021 12/16/20   Tyler Pita, MD  prochlorperazine (COMPAZINE) 10 MG tablet Take 1 tablet (10 mg total) by mouth every 6 (six) hours as needed for nausea or vomiting. Patient not taking: Reported on 09/09/2021 10/27/20   Curt Bears, MD  protein supplement (RESOURCE BENEPROTEIN) 6 g POWD Take 1 Scoop (6 g total) by mouth 3 (three) times daily with meals. Patient not taking: Reported on 09/05/2021 07/09/21   Raiford Noble Latif, DO  traMADol (ULTRAM) 50 MG tablet Take 50 mg by mouth 3 (three) times daily as needed for severe pain. 10/08/19   [provider]      [name], DO [facility] 09/14/2021 9:19 AM

## 2021-09-14 NOTE — TOC Progression Note (Signed)
Transition of Care [facility]) - Progression Note    Patient Details  Name: Patricia [name] MRN: [id number] Date of Birth: [birth year]  Transition of Care [facility]) CM/SW Contact  Zenon Mayo, RN Phone Number: 09/14/2021, 10:30 AM  Clinical Narrative:    Patient is from home with spouse, has recurrent pl effusion, has had lung cancer in past, 3 liters is her baseline, barriers is ileus and effusion, patient pulled out NG tube and does not want to have another one put in , she does not want anything else to be done, most  likely per  Critical care MD note may need residential hospice. TOC following.          Expected Discharge Plan and Services                                                 Social Determinants of Health (SDOH) Interventions    Readmission Risk Interventions    02/13/2021    1:15 PM 10/17/2020    1:03 PM  Readmission Risk Prevention Plan  Post Dischage Appt  Complete  Medication Screening  Complete  Transportation Screening Complete Complete  Medication Review ([place]) Complete   PCP or Specialist appointment within 3-5 days of discharge Complete   HRI or [place] Complete   SW Recovery Care/Counseling Consult Complete   [place] Not Applicable

## 2021-09-14 NOTE — Plan of Care (Signed)
  Problem: Activity: Goal: Risk for activity intolerance will decrease Outcome: Progressing   Problem: Nutrition: Goal: Adequate nutrition will be maintained Outcome: Progressing   Problem: Coping: Goal: Level of anxiety will decrease Outcome: Progressing   Problem: Pain Managment: Goal: General experience of comfort will improve Outcome: Progressing

## 2021-09-14 NOTE — Consult Note (Signed)
Palliative Care Consult Note                                  Date: 09/14/2021   Patient Name: Patricia [name]  DOB: [birth year]  MRN: [id number]  Age / Sex: 71 y.o., female  PCP: [name], MD Referring Physician: Elmarie Shiley, MD  Reason for Consultation: Establishing goals of care  HPI/Patient Profile: 71 y.o. female  with past medical history of small cell lung cancer diagnosed in 2022 s/p chemo and radiation, anxiety, atrial fibrillation, and hypertension.  She is experienced increased shortness of breath, fatigue, and several falls over the last 4 to 6 weeks resulting in rib fractures and vertebral fracture that was scheduled for kyphoplasty this week.  CT chest showed left pleural effusion and enlarged subcarinal lymph node.  She saw her oncologist Dr. Julien Nordmann on 8/29 and was sent for thoracentesis on 9/1.  750 mL fluid withdrawn prior to procedure was aborted due to coughing.  The next day on 9/2 she presented with worsened mental status and hypoxia to 85% on baseline home O2 3 L.  Patient admitted to [facility] for recurrent left pleural effusion.  Palliative Medicine was consulted for goals of care.  Past Medical History:  Diagnosis Date   Anemia    Anxiety    GERD (gastroesophageal reflux disease)    Hyperlipidemia    Hypertension    IBS (irritable bowel syndrome)    Insomnia    Low back pain    Lung cancer ([place]) 10/2020   TIA (transient ischemic attack) 10/2017   no deficits   Vitamin B12 deficiency    Vitamin D deficiency     Subjective:   I have reviewed medical records including progress notes, labs and imaging. Per Dr. Juline Patch note from this morning, patient does not want additional procedures.   I met with Mazell and her husband [name] at bedside today to discuss diagnosis, prognosis, GOC, EOL wishes, disposition, and options.  I introduced Palliative Medicine as specialized medical care for people living with  serious illness. It focuses on providing relief from the symptoms and stress of a serious illness.   I brief life review was discussed. Lenell Antu and [name] have 2 daughters, [name] and Avenel. They have 3 granddaughters. 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 Prior to her cancer diagnosis, she and [name] enjoyed fishing, gardening, and going to [employer].  We reviewed Aeriana's current clinical status in the context of underlying small cell lung cancer. Discussed the current issues including bacteremia, pleural effusion, and vertebral fracture. We reviewed the results from the pleural fluid collected 9/1, which was negative for malignant cells. We discussed her visit with Dr. Julien Nordmann on 8/29; they share he was concerned about some enlarged lymph nodes and recommended a PET scan. Overall plan was to re-start treatment as soon as possible.   [name] shares that Ramya's back fracture was the beginning of things going "downhill". She has become progressively weaker as her mobility has been limited by back pain. It has become increasingly difficult for [name] to care for her at home.   Created space and opportunity for Amiaya and [name] to explore thoughts and feelings regarding current medical situation. Anjanette feels that her condition has progressively worsened and she doesn't anticipate it getting better. She is tearful as she states she is "ready to go home to  [place]" and that she misses her mother and father. She expresses wishing she never did chemo or radiation.   Kealani also shares feeling conflicted, because her youngest daughter [name] recently pleaded with her "not to give up". [name] tells me their daughters have also considered whether they should seek a second opinion. We plan for a family meeting tomorrow at 2 pm.  Review of Systems  Constitutional:  Positive for fatigue.  Gastrointestinal:  Positive for constipation.  Musculoskeletal:  Positive  for back pain.    Objective:   Primary Diagnoses: Present on Admission:  Recurrent left pleural effusion  AF (paroxysmal atrial fibrillation) (HCC)  Chronic respiratory failure with hypoxia (HCC)  Essential hypertension  Fracture of seventh thoracic vertebra (HCC)  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 (HCC)  Bacteremia  Loculated pleural effusion   Physical Exam Vitals reviewed.  Constitutional:      General: She is not in acute distress.    Appearance: She is ill-appearing.  Pulmonary:     Effort: Pulmonary effort is normal.  Neurological:     Mental Status: She is alert and oriented to person, place, and time.     Motor: Weakness present.  Psychiatric:        Mood and Affect: Affect is tearful.     Vital Signs:  BP 128/88 (BP Location: Left Arm)   Pulse 87   Temp 98.3 F (36.8 C) (Oral)   Resp (!) 23   Ht '5\' 7"'  (1.702 m)   Wt 57.3 kg Comment: pt unable to stand d/t weakness  LMP  (LMP Unknown)   SpO2 97%   BMI 19.79 kg/m   Palliative Assessment/Data: PPS 30-40%     Assessment & Plan:   SUMMARY OF RECOMMENDATIONS   DNR/DNI as previously documented Continue current supportive care  Family meeting tomorrow at 2 pm  Primary Decision Maker: PATIENT  Symptom Management:  Dilaudid 0.5 mg every 3 hours as needed for pain Continue hydrocodone-acetaminophen 5-325 1-2 tablets every 4 hours as needed for pain  Bowel Regimen: (patient has struggled with chronic constipation for most of her life now worsened by opioids) Movantik 25 mg daily Sorbitol 70% solution 60 ml x 1 dose Fleet's enema x 1 prn (if no relief with sorbitol)  Prognosis:  Very guarded  Discharge Planning:  To Be Determined     Thank you for allowing [place] to participate in the care of [place]   Signed by: Elie Confer, NP Palliative Medicine Team  Team Phone # [phone]  For individual providers, please see  AMION

## 2021-09-15 ENCOUNTER — Other Ambulatory Visit: Payer: Self-pay | Admitting: Student

## 2021-09-15 DIAGNOSIS — J90 Pleural effusion, not elsewhere classified: Secondary | ICD-10-CM | POA: Diagnosis not present

## 2021-09-15 LAB — CBC WITH DIFFERENTIAL/PLATELET
Abs Immature Granulocytes: 0.29 10*3/uL — ABNORMAL HIGH (ref 0.00–0.07)
Basophils Absolute: 0 10*3/uL (ref 0.0–0.1)
Basophils Relative: 0 %
Eosinophils Absolute: 0 10*3/uL (ref 0.0–0.5)
Eosinophils Relative: 0 %
HCT: 25.1 % — ABNORMAL LOW (ref 36.0–46.0)
Hemoglobin: 8.8 g/dL — ABNORMAL LOW (ref 12.0–15.0)
Immature Granulocytes: 3 %
Lymphocytes Relative: 2 %
Lymphs Abs: 0.2 10*3/uL — ABNORMAL LOW (ref 0.7–4.0)
MCH: 36.2 pg — ABNORMAL HIGH (ref 26.0–34.0)
MCHC: 35.1 g/dL (ref 30.0–36.0)
MCV: 103.3 fL — ABNORMAL HIGH (ref 80.0–100.0)
Monocytes Absolute: 0.6 10*3/uL (ref 0.1–1.0)
Monocytes Relative: 6 %
Neutro Abs: 9.3 10*3/uL — ABNORMAL HIGH (ref 1.7–7.7)
Neutrophils Relative %: 89 %
Platelets: 98 10*3/uL — ABNORMAL LOW (ref 150–400)
RBC: 2.43 MIL/uL — ABNORMAL LOW (ref 3.87–5.11)
RDW: 14.7 % (ref 11.5–15.5)
WBC: 10.4 10*3/uL (ref 4.0–10.5)
nRBC: 0 % (ref 0.0–0.2)

## 2021-09-15 LAB — PHOSPHORUS: Phosphorus: 3 mg/dL (ref 2.5–4.6)

## 2021-09-15 LAB — COMPREHENSIVE METABOLIC PANEL
ALT: 33 U/L (ref 0–44)
AST: 46 U/L — ABNORMAL HIGH (ref 15–41)
Albumin: 1.9 g/dL — ABNORMAL LOW (ref 3.5–5.0)
Alkaline Phosphatase: 87 U/L (ref 38–126)
Anion gap: 13 (ref 5–15)
BUN: 23 mg/dL (ref 8–23)
CO2: 25 mmol/L (ref 22–32)
Calcium: 8.2 mg/dL — ABNORMAL LOW (ref 8.9–10.3)
Chloride: 94 mmol/L — ABNORMAL LOW (ref 98–111)
Creatinine, Ser: 0.65 mg/dL (ref 0.44–1.00)
GFR, Estimated: >60 mL/min (ref >60)
Glucose, Bld: 95 mg/dL (ref 70–99)
Potassium: 3.2 mmol/L — ABNORMAL LOW (ref 3.5–5.1)
Sodium: 132 mmol/L — ABNORMAL LOW (ref 135–145)
Total Bilirubin: 0.6 mg/dL (ref 0.3–1.2)
Total Protein: 5.2 g/dL — ABNORMAL LOW (ref 6.5–8.1)

## 2021-09-15 LAB — MAGNESIUM: Magnesium: 1.7 mg/dL (ref 1.7–2.4)

## 2021-09-15 LAB — CYTOLOGY - NON PAP

## 2021-09-15 MED ORDER — CLONAZEPAM 0.5 MG PO TABS
0.5000 mg | ORAL_TABLET | Freq: Two times a day (BID) | ORAL | Status: DC | PRN
  Administered 2021-09-15: 0.5 mg via ORAL
  Filled 2021-09-15: qty 1

## 2021-09-15 MED ORDER — POLYETHYLENE GLYCOL 3350 17 G PO PACK
17.0000 g | PACK | Freq: Two times a day (BID) | ORAL | Status: DC
  Administered 2021-09-18: 17 g via ORAL
  Filled 2021-09-15 (×7): qty 1

## 2021-09-15 MED ORDER — POLYETHYLENE GLYCOL 3350 17 G PO PACK
17.0000 g | PACK | Freq: Every day | ORAL | Status: DC
  Administered 2021-09-15: 17 g via ORAL
  Filled 2021-09-15: qty 1

## 2021-09-15 MED ORDER — CALCIUM CARBONATE ANTACID 500 MG PO CHEW
1.0000 | CHEWABLE_TABLET | Freq: Three times a day (TID) | ORAL | Status: DC | PRN
  Administered 2021-09-15 – 2021-09-17 (×2): 200 mg via ORAL
  Filled 2021-09-15 (×2): qty 1

## 2021-09-15 MED ORDER — LIDOCAINE 5 % EX PTCH
1.0000 | MEDICATED_PATCH | CUTANEOUS | Status: DC
  Administered 2021-09-15 – 2021-09-22 (×7): 1 via TRANSDERMAL
  Filled 2021-09-15 (×7): qty 1

## 2021-09-15 MED ORDER — HYDROCOD POLI-CHLORPHE POLI ER 10-8 MG/5ML PO SUER: 5.0000 mL | Freq: Two times a day (BID) | ORAL | Status: DC | PRN

## 2021-09-15 MED ORDER — LACTULOSE 10 GM/15ML PO SOLN
20.0000 g | Freq: Two times a day (BID) | ORAL | Status: DC
  Administered 2021-09-15 – 2021-09-18 (×2): 20 g via ORAL
  Filled 2021-09-15 (×8): qty 30

## 2021-09-15 MED ORDER — POTASSIUM CHLORIDE CRYS ER 20 MEQ PO TBCR
40.0000 meq | EXTENDED_RELEASE_TABLET | Freq: Once | ORAL | Status: AC
  Administered 2021-09-15: 40 meq via ORAL
  Filled 2021-09-15: qty 2

## 2021-09-15 NOTE — Progress Notes (Signed)
Palliative Medicine Progress Note   Patient Name: Patricia [name]       Date: 09/15/2021 DOB: [birth year]  Age: 71 y.o. MRN#: [id number] Attending Physician: Elmarie Shiley, MD Primary Care Physician: [name], MD Admit Date: 09/09/2021    HPI/Patient Profile: 71 y.o. female  with past medical history of small cell lung cancer diagnosed in 2022 s/p chemo and radiation, anxiety, atrial fibrillation, and hypertension.  She is experienced increased shortness of breath, fatigue, and several falls over the last 4 to 6 weeks resulting in rib fractures and vertebral fracture that was scheduled for kyphoplasty this week.  CT chest showed left pleural effusion and enlarged subcarinal lymph node.  She saw her oncologist Dr. Julien Nordmann on 8/29 and was sent for thoracentesis on 9/1.  750 mL fluid withdrawn prior to procedure was aborted due to coughing.  The next day on 9/2 she presented with worsened mental status and hypoxia to 85% on baseline home O2 3 L.  Patient admitted to [facility] for recurrent left pleural effusion.  Palliative Medicine was consulted for goals of care.  Subjective (Family Meeting):   Dr. Tyrell Antonio, Dr. Tamala Julian, and myself met today at bedside with patient, her husband, and 2 daughters [name] and [place]. Family expresses much frustration with patient's medical course and care over the past few months.  They expressed that patient's vertebral fracture has resulted in agonizing pain, which has resulted in poor mobility and progressive functional decline.  They are frustrated that the kyphoplasty has been delayed because it was "not emergent".  They express frustration that patient has "beat cancer" but now has all of these other complications.    Discussed that although pleural fluid did not  show malignant cells, this does not mean that cancer has not returned.  Discussed concern for enlarged lymph nodes and need for PET scan.  Discussed that she has a very aggressive type of cancer which frequently recurs at some point.   Discussed that patient is not currently a candidate for kyphoplasty due to infection.  Discussed that aggressive treatment of the lung infection would include chest tube placement, as IV antibiotics alone are not adequate to clear the infection.  Patient has previously stated to pulmonary that she does not want a chest tube, and she reiterates that today.   Family inquires about transfer  to [facility] for a second opinion.  Dr. Tamala Julian reiterates that drainage is a cornerstone of infectious disease management, and that there are no novel treatments that would be offered even at an academic medical center.  Dr. Tyrell Antonio states she is willing to request a transfer, but after further questioning family declines that offer.  There is some discussion regarding patient's waxing and waning mental status and her capacity to make decisions.  I did express that patient has consistently stated (at least in the past 2 days) that she does not want additional procedures, treatment, or diagnostic testing.  It was offered to family to have a formal capacity evaluation to which they declined for now.  Patient expresses that her family is upset that she is "giving up".  I responded that when patients decide to focus on comfort, it is not because they are giving up, it is usually because their bodies have been through so much and they are tired.  Family is understandably emotional and tearful.  They ultimately state they will support patient's decision to be comfortable and not pursue additional interventions.  I provided information on home hospice services versus a residential hospice facility.   I encouraged them to take time to process and discuss further as a family, and let them  know I would be available to follow-up tomorrow.     Objective:  Physical Exam Vitals reviewed.  Constitutional:      General: She is not in acute distress.    Appearance: She is ill-appearing.  Pulmonary:     Effort: Pulmonary effort is normal.  Neurological:     Mental Status: She is alert.     Motor: Weakness present.     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)   Chronic respiratory failure with hypoxia (HCC)   Fracture of seventh thoracic vertebra (HCC)   DNR (do not resuscitate)/DNI(Do Not Intubate)   Dysarthria   Bacteremia   Loculated pleural effusion   SBO (small bowel obstruction) (HCC)    Recommendations/Plan: DNR/DNI as previously documented Continue current supportive care Patient/family considering transition to comfort care with hospice but not ready to make decisions today - I will follow-up with him tomorrow   Symptom Management:  Continue Dilaudid 0.5 mg every 3 hours as needed for pain Continue hydrocodone-acetaminophen 5-325 1-2 tablets every 4 hours as needed for pain Movantik 25 mg daily for constipation  Prognosis:  Very guarded  Discharge Planning: To Be Determined   Thank you for allowing the Palliative Medicine Team to assist in the care of this patient.   Greater than 50%  of this time was spent counseling and coordinating care related to the above  assessment and plan.  Total time: 70 minutes   Lavena Bullion, NP Palliative Medicine   Please contact Palliative Medicine Team phone at [phone] for questions and concerns.  For individual provider, see AMION.

## 2021-09-15 NOTE — Progress Notes (Signed)
PROGRESS NOTE    Patricia [name]  WSF:[id number] DOB: May 19, 1950 DOA: 09/09/2021 PCP: [name], MD   Brief Narrative: 71 year old with past medical history significant for small cell lung cancer on the left side, A-fib, recent vertebral fracture awaiting kyphoplasty, pleural effusion status postthoracentesis the day prior to admission.  She presented to the ED complaining of worsening shortness of breath and with altered mental status.  After she had thoracentesis, she went home family had difficulty waking her up.  She took some extra sleeping medicine, Klonopin, Soma, Atarax.  Patient was found to be hypoxic with oxygen saturation 85 room air at home.  Evaluation in the ED she was tachycardic, she was satting 94% on 4 L.  CT chest showed moderate left pleural effusion with near complete consolidation of the left lower lobe and partial consolidation of the lingula, interval resolution of previously seen right pleural effusion.  Admitted with acute hypoxic respiratory failure in the setting of pleural effusion. Neurologist was consulted and patient underwent thoracentesis yielding 200 cc of clear but hemorrhagic fluid.  Patient found to have parapneumonic effusion.  Dr. Katy Fitch discussed with patient, patient would like to proceed with more comfort care approach.   Palliative care consulted, for goals of care.   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  Total nucleated cell 2,650. Cytology:  WBC trending down.  Patient had a long conversation with Dr. Katy Fitch she is interested in  hospice care.   If patient and family wishes to proceed with further treatment she will require a chest tube placement and fibrinolysis. Patient would like hospice care.  Palliative care consulted. Goal of care meeting today.   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  She will need 21 days Augmentin per pulmonology.   Small cell carcinoma of lower lobe on the left Status post chemoradiation Oncology concerned about enlarged pulmonary lymph nodes. Possible metastasis to LN.- outpatient PET/CT  Dysarthria: Initial CT head negative Patient is stop her Eliquis 48 hours ago in preparation for kyphoplasty for her vertebral fracture. Patient with nasal quality to voice, not her usual speech pattern. MRI; 9/3: No acute intracranial abnormality.  Mild sequela of whole brain radiation with noted strong evidence of metastatic disease in this noncontrast exam.  Fracture of seventh thoracic vertebra Is scheduled for kyphoplasty take for this week. With active infection, no safe to proceed with kyphoplasty  A-fib: Holding Eliquis, awaiting decision for chest tube. SubQ heparin.   Hypertension: Continue with Toprol  Constipation, SBO vs Ileus;  Patient remove NGT .  KUB less small bowel dilation, stool thorough out colon.  Had BM 8/7  Continue with senna, started  miralax. Advance die to full liquid.   Hypokalemia; replaced.   Hypomagnesemia. Allergies to magnesium  Mild hyponatremia; monitor.   Chronic Diastolic HF; compensated,.   Estimated body mass index is 19.92 kg/m as calculated from the following:   Height as of this encounter:  5\' 7"  (1.702 m).   Weight as of this encounter: 57.7 kg.   DVT prophylaxis: Heparin  Code Status: DNR Family Communication: husband at bedside 9/07.   Disposition Plan:  Status is: Inpatient Remains inpatient appropriate because: management pleural effusion, Ileus.     Consultants:  Pulmonology   Procedures:  9/1 Thoracentesis: 9/4 spoke at length with lab and they never received sample for processing 9/2 CT chest W0 contrast -Overall no significant interval change in the appearance of the lungs since the prior CT of 09/04/2021. -Moderate left pleural effusion with near complete consolidation LLL/partial consolidation  lingula similar to prior CT. -Post radiation changes LUL similar to prior CT. -Interval resolution  right pleural effusion. -Fractures of the posterolateral ninth and eleventh rib on right.  No new fracture. 9/4 DG CXR: -interval complete opacification left hemithorax suggesting atelectasis in left lung, possibly due to central obstructing mucous plug. Part of this opacity is due to left pleural effusion. -Small patchy infiltrate in right lower lung field may suggest atelectasis/pneumonia. There is no pneumothorax. 9/4 KUB There is increased amount of gas which appears to be mostly in the colon suggesting possible ileus or distal colonic obstruction.  9/5 LEFT Thoracentesis 212ml Aspirated  Antimicrobials:    Subjective: She had BM yesterday.  She continue to say she doesn't wants aggressive intervention. She report cough, wake her up. I have order Tussionex  Objective: Vitals:   09/15/21 0400 09/15/21 0743 09/15/21 0827 09/15/21 0938  BP:  135/85    Pulse:  82    Resp:  (!) 21  20  Temp:  98 F (36.7 C)    TempSrc:  Oral    SpO2:  94% 96%   Weight: 57.7 kg     Height:        Intake/Output Summary (Last 24 hours) at 09/15/2021 1302 Last data filed at 09/15/2021 0900 Gross per 24 hour  Intake 856.74 ml  Output 550 ml  Net 306.74 ml    Filed Weights   09/13/21 0453 09/14/21 0500 09/15/21 0400  Weight: 57 kg 57.3 kg 57.7 kg    Examination:  General exam: NAD Respiratory system: BL air  movement.  Cardiovascular system: S1, S2 RRR Gastrointestinal system: BS present, soft, nt Central nervous system: Alert, follows command Extremities: No edema    Data Reviewed: I have personally reviewed following labs and imaging studies  CBC: Recent Labs  Lab 09/11/21 0229 09/12/21 0254 09/13/21 0735 09/14/21 0335 09/15/21 0601  WBC 12.8* 14.3* 13.8* 12.6* 10.4  NEUTROABS 11.9* 13.1* 12.7* 11.5* 9.3*  HGB 10.2* 10.3* 9.6* 8.8* 8.8*  HCT 28.4* 28.7* 26.7* 24.5* 25.1*  MCV 102.9* 102.5* 103.5* 101.2* 103.3*  PLT 122* 128* 113* 102* 98*    Basic Metabolic Panel: Recent Labs  Lab 09/11/21 0229 09/12/21 0254 09/13/21 0735 09/14/21 0335 09/15/21 0601  NA 131* 130* 134* 134* 132*  K 4.0 4.1 3.7 3.6 3.2*  CL 97* 96* 97* 97* 94*  CO2 24 25 25 24 25   GLUCOSE 97 113* 98 89 95  BUN 30* 28* 26* 27* 23  CREATININE 0.82 0.67 0.76 0.79 0.65  CALCIUM 8.6* 8.5* 8.4* 8.3* 8.2*  MG 2.1 1.8 1.7 1.9 1.7  PHOS 3.2 2.7 3.2 3.4 3.0    GFR: Estimated Creatinine Clearance: 57.9 mL/min (by C-G formula based on SCr of 0.65 mg/dL). Liver Function Tests: Recent Labs  Lab 09/11/21 0229 09/12/21 0254 09/13/21 0735 09/14/21 0335 09/15/21 0601  AST 29 18 32 29 46*  ALT 40 30 29 28  33  ALKPHOS 71 77 89 87 87  BILITOT 0.5 0.6 0.7 0.8 0.6  PROT 5.5* 5.6* 5.3* 5.3* 5.2*  ALBUMIN 2.4* 2.2* 2.0* 2.0* 1.9*    No results for input(s): "LIPASE", "AMYLASE" in the last 168 hours. Recent Labs  Lab 09/09/21 1520 09/11/21 1638  AMMONIA 19 17    Coagulation Profile: No results for input(s): "INR", "PROTIME" in the last 168 hours. Cardiac Enzymes: No results for input(s): "CKTOTAL", "CKMB", "CKMBINDEX", "TROPONINI" in the last 168 hours. BNP (last 3 results) No results for input(s): "PROBNP" in the last 8760 hours. HbA1C: No results for input(s): "HGBA1C" in the last 72 hours. CBG: Recent Labs  Lab 09/09/21 1557 09/09/21 1915  GLUCAP 142* 106*    Lipid Profile: No results for  input(s): "CHOL", "HDL", "LDLCALC", "TRIG", "CHOLHDL", "LDLDIRECT" in the last 72 hours. Thyroid Function Tests: No results for input(s): "TSH", "T4TOTAL", "FREET4", "T3FREE", "THYROIDAB" in the last 72 hours. Anemia Panel: No results for input(s): "VITAMINB12", "FOLATE", "FERRITIN", "TIBC", "IRON", "RETICCTPCT" in the last 72 hours. Sepsis Labs: Recent Labs  Lab 09/09/21 1515 09/11/21 1638 09/11/21 1821  PROCALCITON 1.23  --   --   LATICACIDVEN  --  1.5 1.4     Recent Results (from the past 240 hour(s))  Surgical pcr screen     Status: None   Collection Time: 09/07/21  2:49 PM   Specimen: Nasal Mucosa; Nasal Swab  Result Value Ref Range Status   MRSA, PCR NEGATIVE NEGATIVE Final   Staphylococcus aureus NEGATIVE NEGATIVE Final    Comment: (NOTE) The Xpert SA Assay (FDA approved for NASAL specimens in patients 76 years of age and older), is one component of a comprehensive surveillance program. It is not intended to diagnose infection nor to guide or monitor treatment. Performed at [facility], [place] [address], [place], [place] [address]   Blood culture (routine x 2)     Status: Abnormal   Collection Time: 09/09/21  3:17 PM   Specimen: BLOOD  Result Value Ref Range Status   Specimen Description BLOOD LEFT ANTECUBITAL  Final   Special Requests   Final    BOTTLES DRAWN AEROBIC AND ANAEROBIC Blood Culture adequate volume   Culture  Setup Time   Final    GRAM NEGATIVE RODS IN BOTH AEROBIC AND ANAEROBIC BOTTLES CRITICAL VALUE NOTED.  VALUE IS CONSISTENT WITH PREVIOUSLY REPORTED AND CALLED VALUE.    Culture (A)  Final    PASTEURELLA MULTOCIDA Usually susceptible to penicillin and other beta lactam agents,quinolones,macrolides and tetracyclines. HEALTH DEPARTMENT NOTIFIED Performed at [facility], [place] [address], [place], [place] [address]    Report Status 09/12/2021 FINAL  Final  Blood culture (routine x 2)     Status: Abnormal   Collection Time: 09/09/21   3:22 PM   Specimen: BLOOD  Result Value Ref Range Status   Specimen Description BLOOD RIGHT ANTECUBITAL  Final   Special Requests   Final    BOTTLES DRAWN AEROBIC AND ANAEROBIC Blood Culture adequate volume   Culture  Setup Time   Final    GRAM NEGATIVE RODS IN BOTH AEROBIC AND ANAEROBIC BOTTLES CRITICAL RESULT CALLED TO, READ BACK BY AND VERIFIED WITH: PHARMD HEATHER W [id number] FCP    Culture (A)  Final    PASTEURELLA MULTOCIDA Usually susceptible to penicillin and other beta lactam agents,quinolones,macrolides and tetracyclines. HEALTH DEPARTMENT NOTIFIED Performed at [facility], [place] [address], [place], [place] [address]  Report Status 09/12/2021 FINAL  Final  Blood Culture ID Panel (Reflexed)     Status: None   Collection Time: 09/09/21  3:22 PM  Result Value Ref Range Status   Enterococcus faecalis NOT DETECTED NOT DETECTED Final   Enterococcus Faecium NOT DETECTED NOT DETECTED Final   Listeria monocytogenes NOT DETECTED NOT DETECTED Final    Comment: CRITICAL RESULT CALLED TO, READ BACK BY AND VERIFIED WITH: PHARMD HEATHER W [id number] FCP    Staphylococcus species NOT DETECTED NOT DETECTED Final   Staphylococcus aureus (BCID) NOT DETECTED NOT DETECTED Final   Staphylococcus epidermidis NOT DETECTED NOT DETECTED Final   Staphylococcus lugdunensis NOT DETECTED NOT DETECTED Final   Streptococcus species NOT DETECTED NOT DETECTED Final   Streptococcus agalactiae NOT DETECTED NOT DETECTED Final   Streptococcus pneumoniae NOT DETECTED NOT DETECTED Final   Streptococcus pyogenes NOT DETECTED NOT DETECTED Final   A.calcoaceticus-baumannii NOT DETECTED NOT DETECTED Final   Bacteroides fragilis NOT DETECTED NOT DETECTED Final   Enterobacterales NOT DETECTED NOT DETECTED Final   Enterobacter cloacae complex NOT DETECTED NOT DETECTED Final   Escherichia coli NOT DETECTED NOT DETECTED Final   Klebsiella aerogenes NOT DETECTED NOT DETECTED Final   Klebsiella oxytoca  NOT DETECTED NOT DETECTED Final   Klebsiella pneumoniae NOT DETECTED NOT DETECTED Final   Proteus species NOT DETECTED NOT DETECTED Final   Salmonella species NOT DETECTED NOT DETECTED Final   Serratia marcescens NOT DETECTED NOT DETECTED Final   Haemophilus influenzae NOT DETECTED NOT DETECTED Final   Neisseria meningitidis NOT DETECTED NOT DETECTED Final   Pseudomonas aeruginosa NOT DETECTED NOT DETECTED Final   Stenotrophomonas maltophilia NOT DETECTED NOT DETECTED Final   Candida albicans NOT DETECTED NOT DETECTED Final   Candida auris NOT DETECTED NOT DETECTED Final   Candida glabrata NOT DETECTED NOT DETECTED Final   Candida krusei NOT DETECTED NOT DETECTED Final   Candida parapsilosis NOT DETECTED NOT DETECTED Final   Candida tropicalis NOT DETECTED NOT DETECTED Final   Cryptococcus neoformans/gattii NOT DETECTED NOT DETECTED Final    Comment: Performed at [facility], [address] [place], [place] [address]  Expectorated Sputum Assessment w Gram Stain, Rflx to Resp Cult     Status: None   Collection Time: 09/10/21  1:31 PM   Specimen: Sputum  Result Value Ref Range Status   Specimen Description SPU  Final   Special Requests NONE  Final   Sputum evaluation   Final    Sputum specimen not acceptable for testing.  Please recollect.   Gram Stain Report Called to,Read Back By and Verified With: RN Mortimer Fries [phone] @2114  FH Performed at [facility], [place] [address], [place], [place] [address]    Report Status 09/12/2021 FINAL  Final  Urine Culture     Status: None   Collection Time: 09/11/21 10:10 AM   Specimen: Urine, Catheterized  Result Value Ref Range Status   Specimen Description URINE, CATHETERIZED  Final   Special Requests Normal  Final   Culture   Final    NO GROWTH Performed at [place] [address], [place],  [address]    Report Status 09/12/2021 FINAL  Final  Body fluid culture w Gram Stain     Status: None (Preliminary result)    Collection Time: 09/12/21  3:30 PM   Specimen: Pleural Fluid  Result Value Ref Range Status   Specimen Description PLEURAL  Final   Special Requests NONE  Final  Gram Stain PENDING  Incomplete   Culture   Final    NO GROWTH 2 DAYS Performed at [facility], [place] [address], [place], [place] [address]    Report Status PENDING  Incomplete         Radiology Studies: DG Abd 1 View  Result Date: 09/13/2021 CLINICAL DATA:  Ileus EXAM: ABDOMEN - 1 VIEW COMPARISON:  09/11/2021 FINDINGS: Large stool burden throughout the colon. No evidence of bowel obstruction. Gas within mildly prominent large and small bowel, likely mild ileus, improved since prior study. No organomegaly or free air. IMPRESSION: Improving gaseous distention of bowel, likely improving ileus. Large stool burden throughout the colon. Electronically Signed   By: Rolm Baptise M.D.   On: 09/13/2021 16:11        Scheduled Meds:  amiodarone  200 mg Oral Daily   Chlorhexidine Gluconate Cloth  6 each Topical Daily   escitalopram  10 mg Oral Daily   guaiFENesin  1,200 mg Oral BID   heparin  5,000 Units Subcutaneous Q8H   ipratropium-albuterol  3 mL Nebulization BID   lidocaine  1 patch Transdermal Q24H   metoprolol succinate  25 mg Oral Daily   naloxegol oxalate  25 mg Oral Daily   pantoprazole (PROTONIX) IV  40 mg Intravenous Q12H   polyethylene glycol  17 g Oral Daily   rosuvastatin  5 mg Oral Daily   senna-docusate  1 tablet Oral BID   sodium chloride HYPERTONIC  4 mL Nebulization BID   sorbitol  60 mL Oral Once   Continuous Infusions:  sodium chloride 10 mL/hr at 09/11/21 1004   ampicillin-sulbactam (UNASYN) IV 3 g (09/15/21 0938)   methocarbamol (ROBAXIN) IV       LOS: 6 days    Time spent: 35 minutes.    Elmarie Shiley, MD [facility]   If 7PM-7AM, please contact night-coverage [email or web address]  09/15/2021, 1:02 PM

## 2021-09-16 DIAGNOSIS — J90 Pleural effusion, not elsewhere classified: Secondary | ICD-10-CM | POA: Diagnosis not present

## 2021-09-16 LAB — CBC WITH DIFFERENTIAL/PLATELET
Abs Immature Granulocytes: 0.27 10*3/uL — ABNORMAL HIGH (ref 0.00–0.07)
Basophils Absolute: 0 10*3/uL (ref 0.0–0.1)
Basophils Relative: 0 %
Eosinophils Absolute: 0 10*3/uL (ref 0.0–0.5)
Eosinophils Relative: 0 %
HCT: 26.1 % — ABNORMAL LOW (ref 36.0–46.0)
Hemoglobin: 9.3 g/dL — ABNORMAL LOW (ref 12.0–15.0)
Immature Granulocytes: 3 %
Lymphocytes Relative: 2 %
Lymphs Abs: 0.2 10*3/uL — ABNORMAL LOW (ref 0.7–4.0)
MCH: 36 pg — ABNORMAL HIGH (ref 26.0–34.0)
MCHC: 35.6 g/dL (ref 30.0–36.0)
MCV: 101.2 fL — ABNORMAL HIGH (ref 80.0–100.0)
Monocytes Absolute: 0.5 10*3/uL (ref 0.1–1.0)
Monocytes Relative: 5 %
Neutro Abs: 8.3 10*3/uL — ABNORMAL HIGH (ref 1.7–7.7)
Neutrophils Relative %: 90 %
Platelets: 98 10*3/uL — ABNORMAL LOW (ref 150–400)
RBC: 2.58 MIL/uL — ABNORMAL LOW (ref 3.87–5.11)
RDW: 14.6 % (ref 11.5–15.5)
WBC: 9.2 10*3/uL (ref 4.0–10.5)
nRBC: 0 % (ref 0.0–0.2)

## 2021-09-16 LAB — BODY FLUID CULTURE W GRAM STAIN
Culture: NO GROWTH
Gram Stain: NONE SEEN

## 2021-09-16 LAB — MAGNESIUM: Magnesium: 1.7 mg/dL (ref 1.7–2.4)

## 2021-09-16 LAB — BASIC METABOLIC PANEL
Anion gap: 15 (ref 5–15)
BUN: 19 mg/dL (ref 8–23)
CO2: 25 mmol/L (ref 22–32)
Calcium: 8 mg/dL — ABNORMAL LOW (ref 8.9–10.3)
Chloride: 93 mmol/L — ABNORMAL LOW (ref 98–111)
Creatinine, Ser: 0.61 mg/dL (ref 0.44–1.00)
GFR, Estimated: >60 mL/min (ref >60)
Glucose, Bld: 123 mg/dL — ABNORMAL HIGH (ref 70–99)
Potassium: 2.6 mmol/L — CL (ref 3.5–5.1)
Sodium: 133 mmol/L — ABNORMAL LOW (ref 135–145)

## 2021-09-16 LAB — PHOSPHORUS: Phosphorus: 2.8 mg/dL (ref 2.5–4.6)

## 2021-09-16 MED ORDER — HALOPERIDOL LACTATE 5 MG/ML IJ SOLN: 2.0000 mg | Freq: Four times a day (QID) | INTRAMUSCULAR | Status: DC | PRN

## 2021-09-16 MED ORDER — METOPROLOL TARTRATE 5 MG/5ML IV SOLN: 2.5000 mg | Freq: Four times a day (QID) | INTRAVENOUS | Status: DC | PRN

## 2021-09-16 MED ORDER — MELATONIN 3 MG PO TABS
3.0000 mg | ORAL_TABLET | Freq: Every evening | ORAL | Status: DC | PRN
  Administered 2021-09-16 – 2021-09-17 (×2): 3 mg via ORAL
  Filled 2021-09-16 (×2): qty 1

## 2021-09-16 MED ORDER — POTASSIUM CHLORIDE 10 MEQ/100ML IV SOLN
10.0000 meq | INTRAVENOUS | Status: AC
  Administered 2021-09-16 (×4): 10 meq via INTRAVENOUS
  Filled 2021-09-16 (×4): qty 100

## 2021-09-16 MED ORDER — POTASSIUM CHLORIDE CRYS ER 20 MEQ PO TBCR
40.0000 meq | EXTENDED_RELEASE_TABLET | ORAL | Status: AC
  Administered 2021-09-16 (×2): 40 meq via ORAL
  Filled 2021-09-16 (×2): qty 2

## 2021-09-16 MED ORDER — SODIUM CHLORIDE 1 G PO TABS
1.0000 g | ORAL_TABLET | Freq: Every day | ORAL | Status: DC
  Administered 2021-09-16 – 2021-09-21 (×6): 1 g via ORAL
  Filled 2021-09-16 (×6): qty 1

## 2021-09-16 NOTE — Progress Notes (Signed)
PROGRESS NOTE    Patricia [name]  PVV:[id number] DOB: 1950/10/23 DOA: 09/09/2021 PCP: [name], MD   Brief Narrative: 71 year old with past medical history significant for small cell lung cancer on the left side, A-fib, recent vertebral fracture awaiting kyphoplasty, pleural effusion status postthoracentesis the day prior to admission.  She presented to the ED complaining of worsening shortness of breath and with altered mental status.  After she had thoracentesis, she went home family had difficulty waking her up.  She took some extra sleeping medicine, Klonopin, Soma, Atarax.  Patient was found to be hypoxic with oxygen saturation 85 room air at home.  Evaluation in the ED she was tachycardic, she was satting 94% on 4 L.  CT chest showed moderate left pleural effusion with near complete consolidation of the left lower lobe and partial consolidation of the lingula, interval resolution of previously seen right pleural effusion.  Admitted with acute hypoxic respiratory failure in the setting of pleural effusion. Neurologist was consulted and patient underwent thoracentesis yielding 200 cc of clear but hemorrhagic fluid.  Patient found to have parapneumonic effusion.  Dr. Katy Fitch discussed with patient, patient would like to proceed with more comfort care approach.   Palliative care consulted, family meeting 9/08, see palliative care PN 9/08. Plan to continue with support care, family/patient considering transition to comfort care, palliative to discussed further today.   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  Total nucleated cell 2,650. Cytology:  WBC trending down.  Patient had a long conversation with Dr. Katy Fitch she is interested in  hospice care.   If patient and family wishes to proceed with further treatment she will require a chest tube placement and fibrinolysis. Patient would like hospice care.  Palliative care consulted, another meeting today.   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  She will need 21 days Augmentin per pulmonology.   Small cell carcinoma of lower lobe on the left Status post chemoradiation Oncology concerned about enlarged pulmonary lymph nodes. Possible metastasis to LN.- outpatient PET/CT  Dysarthria: Initial CT head negative Patient is stop her Eliquis 48 hours ago in preparation for kyphoplasty for her vertebral fracture. Patient with nasal quality to voice, not her usual speech pattern. MRI; 9/3: No acute intracranial abnormality.  Mild sequela of whole brain radiation with noted strong evidence of metastatic disease in this noncontrast exam.  Fracture of seventh thoracic vertebra She was  scheduled for kyphoplasty this week.  With active infection, no safe to proceed with kyphoplasty  A-fib: Holding Eliquis, awaiting decision for chest tube. SubQ heparin.   Hypertension: Continue with Toprol  Constipation, SBO vs Ileus;  Patient remove NGT .  KUB less small bowel dilation, stool thorough out colon.  Had BM 8/7 ---large BM 9/08 Continue with senna, started  miralax. Lactulose.  Plan to advanced diet to regular diet.   Hypokalemia; replaced.   Hypomagnesemia. Allergies to magnesium  Mild hyponatremia; monitor. Resume  sodium tablet.  Acute delirium; patient more confuse this am. Suspect related to klonopin. She is alert and  conversant this am. Oriented to person, place situation. Will discontinue Klonopin. Melatonin order PRN  Chronic Diastolic HF; compensated,.   Estimated body mass index is 24 kg/m as calculated from the following:   Height as of this encounter: 5\' 7"  (1.702 m).   Weight as of this encounter: 69.5 kg.   DVT prophylaxis: Heparin  Code Status: DNR Family Communication: husband at bedside 9/08.  Disposition Plan:  Status is: Inpatient Remains inpatient appropriate because: management pleural effusion, Ileus.     Consultants:  Pulmonology   Procedures:  9/1 Thoracentesis: 9/4 spoke at length with lab and they never received sample for processing 9/2 CT chest W0 contrast -Overall no significant interval change in the appearance of the lungs since the prior CT of 09/04/2021. -Moderate left pleural effusion with near complete consolidation LLL/partial consolidation  lingula similar to prior CT. -Post radiation changes LUL similar to prior CT. -Interval resolution  right pleural effusion. -Fractures of the posterolateral ninth and eleventh rib on right.  No new fracture. 9/4 DG CXR: -interval complete opacification left hemithorax suggesting atelectasis in left lung, possibly due to central obstructing mucous plug. Part of this opacity is due to left pleural effusion. -Small patchy infiltrate in right lower lung field may suggest atelectasis/pneumonia. There is no pneumothorax. 9/4 KUB There is increased amount of gas which appears to be mostly in the colon suggesting possible ileus or distal colonic obstruction.  9/5 LEFT Thoracentesis 269ml Aspirated  Antimicrobials:    Subjective: She report chest congestion and cough. She was started on Guaifenesin. Explain to her symptoms are related to PNA. She continue to decline chest tube. She has been going  through a lot pain. She just received IV pain medication from nurse. She had large bowel movement.   Objective: Vitals:    09/15/21 1947 09/15/21 2016 09/16/21 0330 09/16/21 0730  BP:  125/67 135/80   Pulse:  80 82   Resp:  19 19   Temp:  97.8 F (36.6 C) 98.2 F (36.8 C)   TempSrc:  Oral Oral   SpO2: 98% 96% 97% 98%  Weight:   69.5 kg   Height:        Intake/Output Summary (Last 24 hours) at 09/16/2021 1326 Last data filed at 09/16/2021 0900 Gross per 24 hour  Intake 1063.5 ml  Output 551 ml  Net 512.5 ml    Filed Weights   09/14/21 0500 09/15/21 0400 09/16/21 0330  Weight: 57.3 kg 57.7 kg 69.5 kg    Examination:  General exam: NAD Respiratory system: BL air movement, ronchus Cardiovascular system: S 1, S 2 IRR tachycardic Gastrointestinal system: BS present, soft nt Central nervous system: alert, follows command Extremities: No edema    Data Reviewed: I have personally reviewed following labs and imaging studies  CBC: Recent Labs  Lab 09/12/21 0254 09/13/21 0735 09/14/21 0335 09/15/21 0601 09/16/21 0320  WBC 14.3* 13.8* 12.6* 10.4 9.2  NEUTROABS 13.1* 12.7* 11.5* 9.3* 8.3*  HGB 10.3* 9.6* 8.8* 8.8* 9.3*  HCT 28.7* 26.7* 24.5* 25.1* 26.1*  MCV 102.5* 103.5* 101.2* 103.3* 101.2*  PLT 128* 113* 102* 98* 98*    Basic Metabolic Panel: Recent Labs  Lab 09/11/21 0229 09/12/21 0254 09/13/21 0735 09/14/21 0335 09/15/21 0601 09/16/21 0320  NA 131* 130* 134* 134* 132*  --   K 4.0 4.1 3.7 3.6 3.2*  --   CL 97* 96*  97* 97* 94*  --   CO2 24 25 25 24 25   --   GLUCOSE 97 113* 98 89 95  --   BUN 30* 28* 26* 27* 23  --   CREATININE 0.82 0.67 0.76 0.79 0.65  --   CALCIUM 8.6* 8.5* 8.4* 8.3* 8.2*  --   MG 2.1 1.8 1.7 1.9 1.7 1.7  PHOS 3.2 2.7 3.2 3.4 3.0 2.8    GFR: Estimated Creatinine Clearance: 61.8 mL/min (by C-G formula based on SCr of 0.65 mg/dL). Liver Function Tests: Recent Labs  Lab 09/11/21 0229 09/12/21 0254 09/13/21 0735 09/14/21 0335 09/15/21 0601  AST 29 18 32 29 46*  ALT 40 30 29 28  33  ALKPHOS 71 77 89 87 87  BILITOT 0.5 0.6 0.7 0.8 0.6  PROT 5.5* 5.6*  5.3* 5.3* 5.2*  ALBUMIN 2.4* 2.2* 2.0* 2.0* 1.9*    No results for input(s): "LIPASE", "AMYLASE" in the last 168 hours. Recent Labs  Lab 09/09/21 1520 09/11/21 1638  AMMONIA 19 17    Coagulation Profile: No results for input(s): "INR", "PROTIME" in the last 168 hours. Cardiac Enzymes: No results for input(s): "CKTOTAL", "CKMB", "CKMBINDEX", "TROPONINI" in the last 168 hours. BNP (last 3 results) No results for input(s): "PROBNP" in the last 8760 hours. HbA1C: No results for input(s): "HGBA1C" in the last 72 hours. CBG: Recent Labs  Lab 09/09/21 1557 09/09/21 1915  GLUCAP 142* 106*    Lipid Profile: No results for input(s): "CHOL", "HDL", "LDLCALC", "TRIG", "CHOLHDL", "LDLDIRECT" in the last 72 hours. Thyroid Function Tests: No results for input(s): "TSH", "T4TOTAL", "FREET4", "T3FREE", "THYROIDAB" in the last 72 hours. Anemia Panel: No results for input(s): "VITAMINB12", "FOLATE", "FERRITIN", "TIBC", "IRON", "RETICCTPCT" in the last 72 hours. Sepsis Labs: Recent Labs  Lab 09/09/21 1515 09/11/21 1638 09/11/21 1821  PROCALCITON 1.23  --   --   LATICACIDVEN  --  1.5 1.4     Recent Results (from the past 240 hour(s))  Surgical pcr screen     Status: None   Collection Time: 09/07/21  2:49 PM   Specimen: Nasal Mucosa; Nasal Swab  Result Value Ref Range Status   MRSA, PCR NEGATIVE NEGATIVE Final   Staphylococcus aureus NEGATIVE NEGATIVE Final    Comment: (NOTE) The Xpert SA Assay (FDA approved for NASAL specimens in patients 60 years of age and older), is one component of a comprehensive surveillance program. It is not intended to diagnose infection nor to guide or monitor treatment. Performed at [facility], [place] [address] [place], [place] [address]   Blood culture (routine x 2)     Status: Abnormal   Collection Time: 09/09/21  3:17 PM   Specimen: BLOOD  Result Value Ref Range Status   Specimen Description BLOOD LEFT ANTECUBITAL  Final   Special  Requests   Final    BOTTLES DRAWN AEROBIC AND ANAEROBIC Blood Culture adequate volume   Culture  Setup Time   Final    GRAM NEGATIVE RODS IN BOTH AEROBIC AND ANAEROBIC BOTTLES CRITICAL VALUE NOTED.  VALUE IS CONSISTENT WITH PREVIOUSLY REPORTED AND CALLED VALUE.    Culture (A)  Final    PASTEURELLA MULTOCIDA Usually susceptible to penicillin and other beta lactam agents,quinolones,macrolides and tetracyclines. HEALTH DEPARTMENT NOTIFIED Performed at [facility], [place] [address], [place], [place] [address]    Report Status 09/12/2021 FINAL  Final  Blood culture (routine x 2)     Status: Abnormal   Collection Time: 09/09/21  3:22  PM   Specimen: BLOOD  Result Value Ref Range Status   Specimen Description BLOOD RIGHT ANTECUBITAL  Final   Special Requests   Final    BOTTLES DRAWN AEROBIC AND ANAEROBIC Blood Culture adequate volume   Culture  Setup Time   Final    GRAM NEGATIVE RODS IN BOTH AEROBIC AND ANAEROBIC BOTTLES CRITICAL RESULT CALLED TO, READ BACK BY AND VERIFIED WITH: PHARMD HEATHER W [id number] FCP    Culture (A)  Final    PASTEURELLA MULTOCIDA Usually susceptible to penicillin and other beta lactam agents,quinolones,macrolides and tetracyclines. HEALTH DEPARTMENT NOTIFIED Performed at [facility], [place] [address], [place], [place] [address]    Report Status 09/12/2021 FINAL  Final  Blood Culture ID Panel (Reflexed)     Status: None   Collection Time: 09/09/21  3:22 PM  Result Value Ref Range Status   Enterococcus faecalis NOT DETECTED NOT DETECTED Final   Enterococcus Faecium NOT DETECTED NOT DETECTED Final   Listeria monocytogenes NOT DETECTED NOT DETECTED Final    Comment: CRITICAL RESULT CALLED TO, READ BACK BY AND VERIFIED WITH: PHARMD HEATHER W [id number] FCP    Staphylococcus species NOT DETECTED NOT DETECTED Final   Staphylococcus aureus (BCID) NOT DETECTED NOT DETECTED Final   Staphylococcus epidermidis NOT DETECTED NOT DETECTED Final    Staphylococcus lugdunensis NOT DETECTED NOT DETECTED Final   Streptococcus species NOT DETECTED NOT DETECTED Final   Streptococcus agalactiae NOT DETECTED NOT DETECTED Final   Streptococcus pneumoniae NOT DETECTED NOT DETECTED Final   Streptococcus pyogenes NOT DETECTED NOT DETECTED Final   A.calcoaceticus-baumannii NOT DETECTED NOT DETECTED Final   Bacteroides fragilis NOT DETECTED NOT DETECTED Final   Enterobacterales NOT DETECTED NOT DETECTED Final   Enterobacter cloacae complex NOT DETECTED NOT DETECTED Final   Escherichia coli NOT DETECTED NOT DETECTED Final   Klebsiella aerogenes NOT DETECTED NOT DETECTED Final   Klebsiella oxytoca NOT DETECTED NOT DETECTED Final   Klebsiella pneumoniae NOT DETECTED NOT DETECTED Final   Proteus species NOT DETECTED NOT DETECTED Final   Salmonella species NOT DETECTED NOT DETECTED Final   Serratia marcescens NOT DETECTED NOT DETECTED Final   Haemophilus influenzae NOT DETECTED NOT DETECTED Final   Neisseria meningitidis NOT DETECTED NOT DETECTED Final   Pseudomonas aeruginosa NOT DETECTED NOT DETECTED Final   Stenotrophomonas maltophilia NOT DETECTED NOT DETECTED Final   Candida albicans NOT DETECTED NOT DETECTED Final   Candida auris NOT DETECTED NOT DETECTED Final   Candida glabrata NOT DETECTED NOT DETECTED Final   Candida krusei NOT DETECTED NOT DETECTED Final   Candida parapsilosis NOT DETECTED NOT DETECTED Final   Candida tropicalis NOT DETECTED NOT DETECTED Final   Cryptococcus neoformans/gattii NOT DETECTED NOT DETECTED Final    Comment: Performed at [facility], [address] [place], [place] [address]  Expectorated Sputum Assessment w Gram Stain, Rflx to Resp Cult     Status: None   Collection Time: 09/10/21  1:31 PM   Specimen: Sputum  Result Value Ref Range Status   Specimen Description SPU  Final   Special Requests NONE  Final   Sputum evaluation   Final    Sputum specimen not acceptable for testing.  Please recollect.    Gram Stain Report Called to,Read Back By and Verified With: RN ACorinna Capra [phone] @2114  FH Performed at [facility], [place] [address] [place], [place] [address]    Report Status 09/12/2021 FINAL  Final  Urine Culture  Status: None   Collection Time: 09/11/21 10:10 AM   Specimen: Urine, Catheterized  Result Value Ref Range Status   Specimen Description URINE, CATHETERIZED  Final   Special Requests Normal  Final   Culture   Final    NO GROWTH Performed at [facility], [address] [place], [place] [address]    Report Status 09/12/2021 FINAL  Final  Body fluid culture w Gram Stain     Status: None   Collection Time: 09/12/21  3:30 PM   Specimen: Pleural Fluid  Result Value Ref Range Status   Specimen Description PLEURAL  Final   Special Requests NONE  Final   Gram Stain NO WBC SEEN NO ORGANISMS SEEN   Final   Culture   Final    NO GROWTH 3 DAYS Performed at [facility], [place] [address] [place], [place] [address]    Report Status 09/16/2021 FINAL  Final         Radiology Studies: No results found.      Scheduled Meds:  amiodarone  200 mg Oral Daily   Chlorhexidine Gluconate Cloth  6 each Topical Daily   escitalopram  10 mg Oral Daily   guaiFENesin  1,200 mg Oral BID   heparin  5,000 Units Subcutaneous Q8H   ipratropium-albuterol  3 mL Nebulization BID   lactulose  20 g Oral BID   lidocaine  1 patch Transdermal Q24H   metoprolol succinate  25 mg Oral Daily   naloxegol oxalate  25 mg Oral Daily   pantoprazole (PROTONIX) IV  40 mg Intravenous Q12H   polyethylene glycol  17 g Oral BID   rosuvastatin  5 mg Oral Daily   senna-docusate  1 tablet Oral BID   sorbitol  60 mL Oral Once   Continuous Infusions:  sodium chloride 10 mL/hr at 09/11/21 1004   ampicillin-sulbactam (UNASYN) IV 3 g (09/16/21 1003)   methocarbamol (ROBAXIN) IV       LOS: 7 days    Time spent: 35 minutes.    Elmarie Shiley, MD [facility]   If  7PM-7AM, please contact night-coverage [email or web address]  09/16/2021, 1:26 PM

## 2021-09-16 NOTE — Plan of Care (Signed)
  Problem: Pain Managment: Goal: General experience of comfort will improve Outcome: Completed/Met

## 2021-09-16 NOTE — Progress Notes (Signed)
Palliative Medicine Progress Note   Patient Name: Patricia [name]       Date: 09/16/2021 DOB: 1950-04-14  Age: 71 y.o. MRN#: [id number] Attending Physician: Elmarie Shiley, MD Primary Care Physician: [name], MD Admit Date: 09/09/2021    HPI/Patient Profile:  71 y.o. female  with past medical history of small cell lung cancer diagnosed in 2022 s/p chemo and radiation, anxiety, atrial fibrillation, and hypertension.  She is experienced increased shortness of breath, fatigue, and several falls over the last 4 to 6 weeks resulting in rib fractures and vertebral fracture that was scheduled for kyphoplasty this week.  CT chest showed left pleural effusion and enlarged subcarinal lymph node.  She saw her oncologist Dr. Julien Nordmann on 8/29 and was sent for thoracentesis on 9/1.  750 mL fluid withdrawn prior to procedure was aborted due to coughing.  The next day on 9/2 she presented with worsened mental status and hypoxia to 85% on baseline home O2 3 L.  Patient admitted to [facility] for recurrent left pleural effusion.  Palliative Medicine was consulted for goals of care.  Subjective: Chart reviewed. Visited patient and her family at bedside. Husband and daughter are present. Patient is more confused today than yesterday. Per family (and later confirmed with RN), patient developed delirium and paranoia last night reportedly after a dose of klonazepam, even though patient has taken this for many years for insomnia. Discussed that delirium is usually multi-factorial but can be caused by medication, infection/acute illness, or just being in the hospital.   Created space and opportunity for patient and family to express thoughts regarding difficult discussion yesterday. Despite patient's worsening  confusion/delirium, family feels she still has intermittent moments of clarity. During these moments, she is consistent in her wishes to focus on comfort and not to undergo further procedures. I provided information on comfort care as provided in a residential hospice facility, emphasizing discontinuation of full scope medical interventions, including antibiotics, IV fluids, labs, cardiac monitoring, etc. Discussed that the natural course is allowed to occur, while managing symptoms to alleviate discomfort at end of life. Family verbalize understanding. They are not ready to proceed with referral to residential hospice at this time, and need additional time to process and consider options.     Objective:  Physical Exam Vitals reviewed.  Constitutional:  General: She is not in acute distress.    Appearance: She is ill-appearing.  Pulmonary:     Effort: Pulmonary effort is normal.  Neurological:     Mental Status: She is alert.     Motor: Weakness present.     Comments: Intermittent confusion             Vital Signs: BP 135/80 (BP Location: Left Arm)   Pulse 82   Temp 98.2 F (36.8 C) (Oral)   Resp 19   Ht 5\' 7"  (1.702 m)   Wt 69.5 kg Comment: pt. 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 Please call [phone] with urgent needs   Symptom Management:  Continue Dilaudid 0.5 mg every 3 hours as needed for pain Continue hydrocodone-acetaminophen 5-325 1-2 tablets every 4 hours as needed for pain Movantik 25 mg daily for constipation Haldol 2 mg IV every 6 hours as needed for agitation/delirium  Prognosis:  Very guarded  Discharge Planning: To Be Determined    Thank you for allowing the Palliative Medicine Team to assist in the care of this patient.   MDM - High   Lavena Bullion, NP   Please contact Palliative Medicine Team phone at [phone] for questions and concerns.  For individual providers, please see AMION.

## 2021-09-16 NOTE — Progress Notes (Signed)
Pt is confused and paranoid this am. This has not been pt's behavior the last couple of nights. Reoriented pt to time/place/situation but does not believe she is at [place] given at [facility] to help with pt's insomnia. Prn pain medication not given so far this shift. Heather, pt's daughter, called by this RN and informed her of the above information. Pt denies pain and cooperative with bedside care. Fall precautions in place. Call light and personal items in reach.

## 2021-09-17 DIAGNOSIS — J90 Pleural effusion, not elsewhere classified: Secondary | ICD-10-CM | POA: Diagnosis not present

## 2021-09-17 LAB — CBC WITH DIFFERENTIAL/PLATELET
Abs Immature Granulocytes: 0.22 10*3/uL — ABNORMAL HIGH (ref 0.00–0.07)
Basophils Absolute: 0 10*3/uL (ref 0.0–0.1)
Basophils Relative: 0 %
Eosinophils Absolute: 0 10*3/uL (ref 0.0–0.5)
Eosinophils Relative: 0 %
HCT: 27.8 % — ABNORMAL LOW (ref 36.0–46.0)
Hemoglobin: 9.5 g/dL — ABNORMAL LOW (ref 12.0–15.0)
Immature Granulocytes: 2 %
Lymphocytes Relative: 2 %
Lymphs Abs: 0.2 10*3/uL — ABNORMAL LOW (ref 0.7–4.0)
MCH: 35.3 pg — ABNORMAL HIGH (ref 26.0–34.0)
MCHC: 34.2 g/dL (ref 30.0–36.0)
MCV: 103.3 fL — ABNORMAL HIGH (ref 80.0–100.0)
Monocytes Absolute: 0.4 10*3/uL (ref 0.1–1.0)
Monocytes Relative: 4 %
Neutro Abs: 9.5 10*3/uL — ABNORMAL HIGH (ref 1.7–7.7)
Neutrophils Relative %: 92 %
Platelets: 90 10*3/uL — ABNORMAL LOW (ref 150–400)
RBC: 2.69 MIL/uL — ABNORMAL LOW (ref 3.87–5.11)
RDW: 14.8 % (ref 11.5–15.5)
WBC: 10.3 10*3/uL (ref 4.0–10.5)
nRBC: 0 % (ref 0.0–0.2)

## 2021-09-17 LAB — BASIC METABOLIC PANEL
Anion gap: 14 (ref 5–15)
BUN: 17 mg/dL (ref 8–23)
CO2: 24 mmol/L (ref 22–32)
Calcium: 8 mg/dL — ABNORMAL LOW (ref 8.9–10.3)
Chloride: 97 mmol/L — ABNORMAL LOW (ref 98–111)
Creatinine, Ser: 0.63 mg/dL (ref 0.44–1.00)
GFR, Estimated: >60 mL/min (ref >60)
Glucose, Bld: 105 mg/dL — ABNORMAL HIGH (ref 70–99)
Potassium: 4.1 mmol/L (ref 3.5–5.1)
Sodium: 135 mmol/L (ref 135–145)

## 2021-09-17 LAB — GLUCOSE, CAPILLARY: Glucose-Capillary: 105 mg/dL — ABNORMAL HIGH (ref 70–99)

## 2021-09-17 MED ORDER — ENSURE ENLIVE PO LIQD
237.0000 mL | Freq: Two times a day (BID) | ORAL | Status: DC
  Administered 2021-09-17 – 2021-09-20 (×2): 237 mL via ORAL

## 2021-09-17 MED ORDER — GUAIFENESIN-DM 100-10 MG/5ML PO SYRP
5.0000 mL | ORAL_SOLUTION | ORAL | Status: DC | PRN
  Administered 2021-09-17 – 2021-09-21 (×5): 5 mL via ORAL
  Filled 2021-09-17 (×5): qty 5

## 2021-09-17 MED ORDER — APIXABAN 5 MG PO TABS
5.0000 mg | ORAL_TABLET | Freq: Two times a day (BID) | ORAL | Status: DC
  Administered 2021-09-17: 5 mg via ORAL
  Filled 2021-09-17: qty 1

## 2021-09-17 MED ORDER — MAGNESIUM GLUCONATE 500 MG PO TABS
500.0000 mg | ORAL_TABLET | Freq: Every day | ORAL | Status: DC
  Administered 2021-09-17 – 2021-09-22 (×6): 500 mg via ORAL
  Filled 2021-09-17 (×7): qty 1

## 2021-09-17 NOTE — Progress Notes (Signed)
PROGRESS NOTE    Patricia [name]  DPO:[id number] DOB: [birth year] DOA: 09/09/2021 PCP: [name], MD   Brief Narrative: 71 year old with past medical history significant for small cell lung cancer on the left side, A-fib, recent vertebral fracture awaiting kyphoplasty, pleural effusion status postthoracentesis the day prior to admission.  She presented to the ED complaining of worsening shortness of breath and with altered mental status.  After she had thoracentesis, she went home family had difficulty waking her up.  She took some extra sleeping medicine, Klonopin, Soma, Atarax.  Patient was found to be hypoxic with oxygen saturation 85 room air at home.  Evaluation in the ED she was tachycardic, she was satting 94% on 4 L.  CT chest showed moderate left pleural effusion with near complete consolidation of the left lower lobe and partial consolidation of the lingula, interval resolution of previously seen right pleural effusion.  Admitted with acute hypoxic respiratory failure in the setting of pleural effusion. Neurologist was consulted and patient underwent thoracentesis yielding 200 cc of clear but hemorrhagic fluid.  Patient found to have parapneumonic effusion.  Dr. Katy Fitch discussed with patient, patient would like to proceed with more comfort care approach.   Palliative care consulted, family meeting 9/08, see palliative care PN 9/08. Plan to continue with support care, family/patient considering transition to comfort care, palliative to discussed further today.   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  Total nucleated cell 2,650. Cytology:  WBC trending down.  Patient had a long conversation with Dr. Katy Fitch she is interested in  hospice care.   If patient and family wishes to proceed with further treatment she will require a chest tube placement and fibrinolysis. Patient would like hospice care.  Palliative care consulted, family is not ready to transition to comfort care. Palliative care meeting tomorrow.   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  She will need 21 days Augmentin per pulmonology.   Small cell carcinoma of lower lobe on the left Status post chemoradiation Oncology concerned about enlarged pulmonary lymph nodes. Possible metastasis to LN.- outpatient PET/CT  Dysarthria: Initial CT head negative Patient is stop her Eliquis 48 hours ago in preparation for kyphoplasty for her vertebral fracture. Patient with nasal quality to voice, not her usual speech pattern. MRI; 9/3: No acute intracranial abnormality.  Mild sequela of whole brain radiation with noted strong evidence of metastatic disease in this noncontrast exam.  Fracture of seventh thoracic vertebra She was  scheduled for kyphoplasty this week.  With active infection, no safe to proceed with kyphoplasty  A-fib: Resume Eliquis. Patient is against chest tube.   Hypertension: Continue with Toprol  Constipation, SBO vs Ileus;  Patient remove NGT .  KUB less small bowel dilation, stool thorough out colon.  Had BM 8/7 ---large BM 9/08 Continue with senna, started  miralax. Lactulose.  On regular diet.   Hypokalemia; replaced.   Hypomagnesemia. Allergies to magnesium.  Plan to try Magnesium gluconate.  Mild hyponatremia; monitor. Resume sodium tablet.  Acute delirium; patient more confuse  this am. Suspect related to klonopin. She is alert and conversant this am. Oriented to person, place situation. Will discontinue Klonopin. Melatonin order PRN Ammonia level normal. Plan to check TSH  Chronic Diastolic HF; compensated,.   Estimated body mass index is 24.2 kg/m as calculated from the following:   Height as of this encounter: 5\' 7"  (1.702 m).   Weight as of this encounter: 70.1 kg.   DVT prophylaxis: Heparin  Code Status: DNR Family Communication: Daughter 9/10 Disposition Plan:  Status is: Inpatient Remains inpatient appropriate because: management pleural effusion, Ileus.     Consultants:  Pulmonology   Procedures:  9/1 Thoracentesis: 9/4 spoke at length with lab and they never received sample for processing 9/2 CT chest W0 contrast -Overall no significant interval change in the appearance of the lungs since the prior CT of 09/04/2021. -Moderate left pleural effusion with near complete consolidation LLL/partial consolidation  lingula similar to prior CT. -Post radiation changes LUL similar to prior CT. -Interval resolution  right pleural effusion. -Fractures of the posterolateral ninth and eleventh rib on right.  No new fracture. 9/4 DG CXR: -interval complete opacification left hemithorax suggesting atelectasis in left lung, possibly due to central obstructing mucous plug. Part of this opacity is due to left pleural effusion. -Small patchy infiltrate in right lower lung field may suggest atelectasis/pneumonia. There is no pneumothorax. 9/4 KUB There is increased amount of gas which appears to be mostly in the colon suggesting possible ileus or distal colonic obstruction.  9/5 LEFT Thoracentesis 256ml Aspirated  Antimicrobials:    Subjective: She report cough, chest pain left side rib and upper left side abdominal pain when she cough. She has been having BM  Objective: Vitals:   09/16/21 2135 09/16/21 2358 09/17/21 0406 09/17/21 0750  BP:  129/75  123/67 122/62  Pulse:  98 74 76  Resp:  (!) 22 18 17   Temp:   98 F (36.7 C) 97.6 F (36.4 C)  TempSrc:   Oral Oral  SpO2: 100% 97% 99% 100%  Weight:  70.1 kg    Height:        Intake/Output Summary (Last 24 hours) at 09/17/2021 1347 Last data filed at 09/17/2021 0917 Gross per 24 hour  Intake 1239.84 ml  Output 151 ml  Net 1088.84 ml    Filed Weights   09/15/21 0400 09/16/21 0330 09/16/21 2358  Weight: 57.7 kg 69.5 kg 70.1 kg    Examination:  General exam: NAD Respiratory system: BL air movement  Cardiovascular system: S 1, S 2 RRR Gastrointestinal system: BS present, soft nt Central nervous system: Alert, follows command Extremities: No edema    Data Reviewed: I have personally reviewed following labs and imaging studies  CBC: Recent Labs  Lab 09/13/21 0735 09/14/21 0335 09/15/21 0601 09/16/21 0320 09/17/21 0606  WBC 13.8* 12.6* 10.4 9.2 10.3  NEUTROABS 12.7* 11.5* 9.3* 8.3* 9.5*  HGB 9.6* 8.8* 8.8* 9.3* 9.5*  HCT 26.7* 24.5* 25.1* 26.1* 27.8*  MCV 103.5* 101.2* 103.3* 101.2* 103.3*  PLT 113* 102* 98* 98* 90*    Basic Metabolic Panel: Recent Labs  Lab 09/12/21 0254 09/13/21 0735 09/14/21 0335 09/15/21 0601 09/16/21 0320 09/16/21 1256 09/17/21 0741  NA 130* 134* 134* 132*  --  133* 135  K 4.1 3.7 3.6 3.2*  --  2.6* 4.1  CL 96* 97* 97* 94*  --  93* 97*  CO2 25 25 24 25   --  25 24  GLUCOSE 113* 98 89 95  --  123* 105*  BUN 28* 26* 27* 23  --  19 17  CREATININE 0.67 0.76 0.79 0.65  --  0.61 0.63  CALCIUM 8.5* 8.4* 8.3* 8.2*  --  8.0* 8.0*  MG 1.8 1.7 1.9 1.7 1.7  --   --   PHOS 2.7 3.2 3.4 3.0 2.8  --   --     GFR: Estimated Creatinine Clearance: 61.8 mL/min (by C-G formula based on SCr of 0.63 mg/dL). Liver Function Tests: Recent Labs  Lab 09/11/21 0229 09/12/21 0254 09/13/21 0735 09/14/21 0335 09/15/21 0601  AST 29 18 32 29 46*  ALT 40 30 29 28  33  ALKPHOS 71 77 89 87 87  BILITOT 0.5 0.6 0.7 0.8 0.6  PROT 5.5* 5.6* 5.3* 5.3* 5.2*   ALBUMIN 2.4* 2.2* 2.0* 2.0* 1.9*    No results for input(s): "LIPASE", "AMYLASE" in the last 168 hours. Recent Labs  Lab 09/11/21 1638  AMMONIA 17    Coagulation Profile: No results for input(s): "INR", "PROTIME" in the last 168 hours. Cardiac Enzymes: No results for input(s): "CKTOTAL", "CKMB", "CKMBINDEX", "TROPONINI" in the last 168 hours. BNP (last 3 results) No results for input(s): "PROBNP" in the last 8760 hours. HbA1C: No results for input(s): "HGBA1C" in the last 72 hours. CBG: Recent Labs  Lab 09/17/21 0609  GLUCAP 105*    Lipid Profile: No results for input(s): "CHOL", "HDL", "LDLCALC", "TRIG", "CHOLHDL", "LDLDIRECT" in the last 72 hours. Thyroid Function Tests: No results for input(s): "TSH", "T4TOTAL", "FREET4", "T3FREE", "THYROIDAB" in the last 72 hours. Anemia Panel: No results for input(s): "VITAMINB12", "FOLATE", "FERRITIN", "TIBC", "IRON", "RETICCTPCT" in the last 72 hours. Sepsis Labs: Recent Labs  Lab 09/11/21 1638 09/11/21 1821  LATICACIDVEN 1.5 1.4     Recent Results (from the past 240 hour(s))  Surgical pcr screen     Status: None   Collection Time: 09/07/21  2:49 PM   Specimen: Nasal Mucosa; Nasal Swab  Result Value Ref Range Status   MRSA, PCR NEGATIVE NEGATIVE Final   Staphylococcus aureus NEGATIVE NEGATIVE Final    Comment: (NOTE) The Xpert SA Assay (FDA approved for NASAL specimens in patients 64 years of age and older), is one component of a comprehensive surveillance program. It is not intended to diagnose infection nor to guide or monitor treatment. Performed at [facility], [place] [address] [place], [place] [address]   Blood culture (routine x 2)     Status: Abnormal   Collection Time: 09/09/21  3:17 PM   Specimen: BLOOD  Result Value Ref Range Status   Specimen Description BLOOD LEFT ANTECUBITAL  Final   Special Requests   Final    BOTTLES DRAWN AEROBIC AND ANAEROBIC Blood Culture adequate volume   Culture  Setup  Time   Final    GRAM NEGATIVE RODS IN BOTH AEROBIC AND ANAEROBIC BOTTLES CRITICAL VALUE NOTED.  VALUE IS CONSISTENT WITH PREVIOUSLY REPORTED AND CALLED VALUE.    Culture (A)  Final    PASTEURELLA MULTOCIDA Usually susceptible to penicillin and other beta lactam agents,quinolones,macrolides and tetracyclines. HEALTH DEPARTMENT NOTIFIED Performed at [facility], [place] [address] [place], [place] [address]    Report Status 09/12/2021 FINAL  Final  Blood culture (routine x 2)     Status: Abnormal   Collection Time: 09/09/21  3:22 PM   Specimen: BLOOD  Result Value Ref Range Status  Specimen Description BLOOD RIGHT ANTECUBITAL  Final   Special Requests   Final    BOTTLES DRAWN AEROBIC AND ANAEROBIC Blood Culture adequate volume   Culture  Setup Time   Final    GRAM NEGATIVE RODS IN BOTH AEROBIC AND ANAEROBIC BOTTLES CRITICAL RESULT CALLED TO, READ BACK BY AND VERIFIED WITH: PHARMD HEATHER W [id number] FCP    Culture (A)  Final    PASTEURELLA MULTOCIDA Usually susceptible to penicillin and other beta lactam agents,quinolones,macrolides and tetracyclines. HEALTH DEPARTMENT NOTIFIED Performed at [facility], [place] [address], [place], [place] [address]    Report Status 09/12/2021 FINAL  Final  Blood Culture ID Panel (Reflexed)     Status: None   Collection Time: 09/09/21  3:22 PM  Result Value Ref Range Status   Enterococcus faecalis NOT DETECTED NOT DETECTED Final   Enterococcus Faecium NOT DETECTED NOT DETECTED Final   Listeria monocytogenes NOT DETECTED NOT DETECTED Final    Comment: CRITICAL RESULT CALLED TO, READ BACK BY AND VERIFIED WITH: PHARMD HEATHER W [id number] FCP    Staphylococcus species NOT DETECTED NOT DETECTED Final   Staphylococcus aureus (BCID) NOT DETECTED NOT DETECTED Final   Staphylococcus epidermidis NOT DETECTED NOT DETECTED Final   Staphylococcus lugdunensis NOT DETECTED NOT DETECTED Final   Streptococcus species NOT DETECTED NOT DETECTED  Final   Streptococcus agalactiae NOT DETECTED NOT DETECTED Final   Streptococcus pneumoniae NOT DETECTED NOT DETECTED Final   Streptococcus pyogenes NOT DETECTED NOT DETECTED Final   A.calcoaceticus-baumannii NOT DETECTED NOT DETECTED Final   Bacteroides fragilis NOT DETECTED NOT DETECTED Final   Enterobacterales NOT DETECTED NOT DETECTED Final   Enterobacter cloacae complex NOT DETECTED NOT DETECTED Final   Escherichia coli NOT DETECTED NOT DETECTED Final   Klebsiella aerogenes NOT DETECTED NOT DETECTED Final   Klebsiella oxytoca NOT DETECTED NOT DETECTED Final   Klebsiella pneumoniae NOT DETECTED NOT DETECTED Final   Proteus species NOT DETECTED NOT DETECTED Final   Salmonella species NOT DETECTED NOT DETECTED Final   Serratia marcescens NOT DETECTED NOT DETECTED Final   Haemophilus influenzae NOT DETECTED NOT DETECTED Final   Neisseria meningitidis NOT DETECTED NOT DETECTED Final   Pseudomonas aeruginosa NOT DETECTED NOT DETECTED Final   Stenotrophomonas maltophilia NOT DETECTED NOT DETECTED Final   Candida albicans NOT DETECTED NOT DETECTED Final   Candida auris NOT DETECTED NOT DETECTED Final   Candida glabrata NOT DETECTED NOT DETECTED Final   Candida krusei NOT DETECTED NOT DETECTED Final   Candida parapsilosis NOT DETECTED NOT DETECTED Final   Candida tropicalis NOT DETECTED NOT DETECTED Final   Cryptococcus neoformans/gattii NOT DETECTED NOT DETECTED Final    Comment: Performed at [facility], [address], [place], [place] [address]  Expectorated Sputum Assessment w Gram Stain, Rflx to Resp Cult     Status: None   Collection Time: 09/10/21  1:31 PM   Specimen: Sputum  Result Value Ref Range Status   Specimen Description SPU  Final   Special Requests NONE  Final   Sputum evaluation   Final    Sputum specimen not acceptable for testing.  Please recollect.   Gram Stain Report Called to,Read Back By and Verified With: RN ACorinna Capra [phone] @2114  FH Performed at [facility], [place] [address], [place], [place] [address]    Report Status 09/12/2021 FINAL  Final  Urine Culture     Status: None   Collection Time: 09/11/21 10:10 AM  Specimen: Urine, Catheterized  Result Value Ref Range Status   Specimen Description URINE, CATHETERIZED  Final   Special Requests Normal  Final   Culture   Final    NO GROWTH Performed at [facility], [address], [place], [place] [address]    Report Status 09/12/2021 FINAL  Final  Body fluid culture w Gram Stain     Status: None   Collection Time: 09/12/21  3:30 PM   Specimen: Pleural Fluid  Result Value Ref Range Status   Specimen Description PLEURAL  Final   Special Requests NONE  Final   Gram Stain NO WBC SEEN NO ORGANISMS SEEN   Final   Culture   Final    NO GROWTH 3 DAYS Performed at [facility], [place] [address] [place], [place] [address]    Report Status 09/16/2021 FINAL  Final         Radiology Studies: No results found.      Scheduled Meds:  amiodarone  200 mg Oral Daily   apixaban  5 mg Oral BID   Chlorhexidine Gluconate Cloth  6 each Topical Daily   escitalopram  10 mg Oral Daily   feeding supplement  237 mL Oral BID BM   guaiFENesin  1,200 mg Oral BID   ipratropium-albuterol  3 mL Nebulization BID   lactulose  20 g Oral BID   lidocaine  1 patch Transdermal Q24H   magnesium gluconate  500 mg Oral Daily   metoprolol succinate  25 mg Oral Daily   naloxegol oxalate  25 mg Oral Daily   pantoprazole (PROTONIX) IV  40 mg Intravenous Q12H   polyethylene glycol  17 g Oral BID   rosuvastatin  5 mg Oral Daily   senna-docusate  1 tablet Oral BID   sodium chloride  1 g Oral Daily   sorbitol  60 mL Oral Once   Continuous Infusions:  sodium chloride 10 mL/hr at 09/11/21 1004   ampicillin-sulbactam (UNASYN) IV 3 g (09/17/21 0917)   methocarbamol (ROBAXIN) IV       LOS: 8 days    Time spent: 35 minutes.    Elmarie Shiley, MD [facility]   If 7PM-7AM,  please contact night-coverage [email or web address]  09/17/2021, 1:47 PM

## 2021-09-18 ENCOUNTER — Inpatient Hospital Stay (HOSPITAL_COMMUNITY): Payer: Medicare HMO

## 2021-09-18 ENCOUNTER — Inpatient Hospital Stay (HOSPITAL_COMMUNITY)
Admission: RE | Admit: 2021-09-18 | Discharge: 2021-09-18 | Disposition: A | Discharge status: Home / Self Care | Payer: Medicare HMO | Source: Ambulatory Visit | Attending: Internal Medicine | Admitting: Internal Medicine

## 2021-09-18 DIAGNOSIS — J90 Pleural effusion, not elsewhere classified: Secondary | ICD-10-CM | POA: Diagnosis not present

## 2021-09-18 LAB — CBC WITH DIFFERENTIAL/PLATELET
Abs Immature Granulocytes: 0.18 10*3/uL — ABNORMAL HIGH (ref 0.00–0.07)
Basophils Absolute: 0 10*3/uL (ref 0.0–0.1)
Basophils Relative: 0 %
Eosinophils Absolute: 0 10*3/uL (ref 0.0–0.5)
Eosinophils Relative: 0 %
HCT: 23.2 % — ABNORMAL LOW (ref 36.0–46.0)
Hemoglobin: 7.9 g/dL — ABNORMAL LOW (ref 12.0–15.0)
Immature Granulocytes: 2 %
Lymphocytes Relative: 2 %
Lymphs Abs: 0.2 10*3/uL — ABNORMAL LOW (ref 0.7–4.0)
MCH: 35.4 pg — ABNORMAL HIGH (ref 26.0–34.0)
MCHC: 34.1 g/dL (ref 30.0–36.0)
MCV: 104 fL — ABNORMAL HIGH (ref 80.0–100.0)
Monocytes Absolute: 0.4 10*3/uL (ref 0.1–1.0)
Monocytes Relative: 4 %
Neutro Abs: 9 10*3/uL — ABNORMAL HIGH (ref 1.7–7.7)
Neutrophils Relative %: 92 %
Platelets: 71 10*3/uL — ABNORMAL LOW (ref 150–400)
RBC: 2.23 MIL/uL — ABNORMAL LOW (ref 3.87–5.11)
RDW: 15.1 % (ref 11.5–15.5)
WBC: 9.8 10*3/uL (ref 4.0–10.5)
nRBC: 0 % (ref 0.0–0.2)

## 2021-09-18 LAB — BASIC METABOLIC PANEL
Anion gap: 8 (ref 5–15)
BUN: 20 mg/dL (ref 8–23)
CO2: 26 mmol/L (ref 22–32)
Calcium: 7.9 mg/dL — ABNORMAL LOW (ref 8.9–10.3)
Chloride: 99 mmol/L (ref 98–111)
Creatinine, Ser: 0.65 mg/dL (ref 0.44–1.00)
GFR, Estimated: >60 mL/min (ref >60)
Glucose, Bld: 103 mg/dL — ABNORMAL HIGH (ref 70–99)
Potassium: 3.7 mmol/L (ref 3.5–5.1)
Sodium: 133 mmol/L — ABNORMAL LOW (ref 135–145)

## 2021-09-18 LAB — HEMOGLOBIN AND HEMATOCRIT, BLOOD
HCT: 26.7 % — ABNORMAL LOW (ref 36.0–46.0)
Hemoglobin: 9.2 g/dL — ABNORMAL LOW (ref 12.0–15.0)

## 2021-09-18 LAB — CULTURE, BLOOD (ROUTINE X 2): Special Requests: ADEQUATE

## 2021-09-18 LAB — TSH: TSH: 1.942 u[IU]/mL (ref 0.350–4.500)

## 2021-09-18 MED ORDER — APIXABAN 5 MG PO TABS
5.0000 mg | ORAL_TABLET | Freq: Two times a day (BID) | ORAL | Status: DC
  Administered 2021-09-18 – 2021-09-22 (×8): 5 mg via ORAL
  Filled 2021-09-18 (×8): qty 1

## 2021-09-18 NOTE — Progress Notes (Addendum)
PROGRESS NOTE    Patricia [name]  FXT:[id number] DOB: 1950-01-17 DOA: 09/09/2021 PCP: [name], MD   Brief Narrative: 71 year old with past medical history significant for small cell lung cancer on the left side, A-fib, recent vertebral fracture awaiting kyphoplasty, pleural effusion status postthoracentesis the day prior to admission.  She presented to the ED complaining of worsening shortness of breath and with altered mental status.  After she had thoracentesis, she went home family had difficulty waking her up.  She took some extra sleeping medicine, Klonopin, Soma, Atarax.  Patient was found to be hypoxic with oxygen saturation 85 room air at home.  Evaluation in the ED she was tachycardic, she was satting 94% on 4 L.  CT chest showed moderate left pleural effusion with near complete consolidation of the left lower lobe and partial consolidation of the lingula, interval resolution of previously seen right pleural effusion.  Admitted with acute hypoxic respiratory failure in the setting of pleural effusion. Neurologist was consulted and patient underwent thoracentesis yielding 200 cc of clear but hemorrhagic fluid.  Patient found to have parapneumonic effusion.  Dr. Katy Fitch discussed with patient, patient would like to proceed with more comfort care approach.   Palliative care consulted, family meeting 9/08, see palliative care PN 9/08. Plan to continue with support care, family/patient considering transition to comfort care, palliative to discussed further.   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  Total nucleated cell 2,650. Cytology: No malignant cells/  WBC trending down.  Patient had a long conversation with Dr. Katy Fitch she is interested in  hospice care.   If patient and family wishes to proceed with further treatment she will require a chest tube placement and fibrinolysis. Patient would like hospice care.  Palliative care consulted, family is not ready to transition to comfort care. Palliative care meeting today.    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  Source not apparent, but they have dog at home.  She will need 21 days Augmentin per pulmonology.   Small cell carcinoma of lower lobe on the left Status post chemoradiation Oncology concerned about enlarged pulmonary lymph nodes. Possible metastasis to LN.- outpatient PET/CT  Dysarthria: Initial CT head negative Patient is stop her Eliquis 48 hours ago in preparation for kyphoplasty for her vertebral fracture. Patient with nasal quality to voice, not her usual speech pattern. MRI; 9/3: No acute intracranial abnormality.  Mild sequela of whole brain radiation with noted strong evidence of metastatic disease in this noncontrast exam.  Fracture of seventh thoracic vertebra She was  scheduled for kyphoplasty this week.  With active infection, no safe to proceed with kyphoplasty Discussed with PA (neurosurgery)  Viona Gilmore, patient needs resolution of Pneumonia, loculated pleural effusion to consider kyphoplasty.  I also spoke with Dr Laurence Ferrari with IR who said patient will need to be clear from Bacteremia but also would need resolution of loculated pleural effusion, because of recommendation for  chest tube.   A-fib:  Patient is against chest tube.  Hold eliquis due to decrease Hb, repeat labs this afternoon.    Hypertension: Continue with Toprol  Constipation, SBO vs Ileus;  Patient remove NGT .  KUB less small bowel dilation, stool thorough out colon.  Had BM 8/7 ---large BM 9/08 Continue with senna, started  miralax. Lactulose.  On regular diet.   Hypokalemia; Replaced.   Hypomagnesemia. Tolerating Magnesium gluconate.  Mild hyponatremia; monitor. Resume sodium tablet.  Acute delirium; patient more confuse this am. Suspect related to klonopin. She is alert and conversant this am. Oriented to person, place situation. Will discontinue Klonopin. Melatonin order PRN Ammonia level normal. Plan to check TSH  Chronic Diastolic HF; compensated,.   Estimated body mass index is 23 kg/m as calculated from the following:   Height as of this encounter: 5\' 7"  (1.702 m).   Weight as of this encounter: 66.6 kg.   DVT prophylaxis: Heparin  Code Status: DNR Family Communication: Daughter 9/10 Disposition Plan:  Status is: Inpatient Remains inpatient appropriate because: management pleural effusion, Ileus.     Consultants:  Pulmonology   Procedures:  9/1 Thoracentesis: 9/4 spoke at length with lab and they never received sample for processing 9/2 CT chest W0 contrast -Overall no significant interval change in the appearance of the lungs since the prior CT of 09/04/2021. -Moderate left pleural effusion with near complete consolidation LLL/partial consolidation  lingula similar to prior CT. -Post radiation changes LUL similar to prior CT. -Interval resolution  right pleural effusion. -Fractures of the posterolateral ninth and eleventh rib on right.  No new fracture. 9/4 DG CXR: -interval complete opacification left hemithorax suggesting atelectasis in left lung, possibly due to central obstructing mucous plug. Part of this opacity is due to left pleural effusion. -Small patchy infiltrate in right lower lung field may suggest atelectasis/pneumonia. There is no pneumothorax. 9/4 KUB There  is increased amount of gas which appears to be mostly in the colon suggesting possible ileus or distal colonic obstruction.  9/5 LEFT Thoracentesis 244ml Aspirated  Antimicrobials:    Subjective: She report SOB, this am  improved after nebulizer. She has remain on 3 L oxygen.  No gross blood on the stool. Abdominal pain is only when she cough.   Objective: Vitals:   09/18/21 0500 09/18/21 1019 09/18/21 1129 09/18/21 1144  BP: (!) 137/94 106/65    Pulse: 82 (!) 120  77  Resp: 20 19    Temp: 98.2 F (36.8 C)     TempSrc: Oral     SpO2: 92% 96% 97%   Weight:      Height:        Intake/Output Summary (Last 24 hours) at 09/18/2021 1226 Last data filed at 09/18/2021 0431 Gross per 24 hour  Intake 474.03 ml  Output 375 ml  Net 99.03 ml    Filed Weights   09/16/21 0330 09/16/21 2358 09/18/21 0400  Weight: 69.5 kg 70.1 kg 66.6 kg    Examination:  General exam: NAD Respiratory system: BL air movement Cardiovascular system: S 1, S 2 RRR Gastrointestinal system: BS, present, soft, nt Central nervous system: Alert, follows command Extremities: No edema    Data Reviewed: I have personally reviewed following labs and imaging studies  CBC: Recent Labs  Lab 09/14/21 0335 09/15/21 0601 09/16/21 0320 09/17/21 0606 09/18/21 0408 09/18/21 1150  WBC 12.6* 10.4 9.2 10.3 9.8  --   NEUTROABS 11.5* 9.3* 8.3* 9.5* 9.0*  --   HGB  8.8* 8.8* 9.3* 9.5* 7.9* 9.2*  HCT 24.5* 25.1* 26.1* 27.8* 23.2* 26.7*  MCV 101.2* 103.3* 101.2* 103.3* 104.0*  --   PLT 102* 98* 98* 90* 71*  --     Basic Metabolic Panel: Recent Labs  Lab 09/12/21 0254 09/13/21 0735 09/14/21 0335 09/15/21 0601 09/16/21 0320 09/16/21 1256 09/17/21 0741 09/18/21 0408  NA 130* 134* 134* 132*  --  133* 135 133*  K 4.1 3.7 3.6 3.2*  --  2.6* 4.1 3.7  CL 96* 97* 97* 94*  --  93* 97* 99  CO2 25 25 24 25   --  25 24 26   GLUCOSE 113* 98 89 95  --  123* 105* 103*  BUN 28* 26* 27* 23  --  19 17 20   CREATININE  0.67 0.76 0.79 0.65  --  0.61 0.63 0.65  CALCIUM 8.5* 8.4* 8.3* 8.2*  --  8.0* 8.0* 7.9*  MG 1.8 1.7 1.9 1.7 1.7  --   --   --   PHOS 2.7 3.2 3.4 3.0 2.8  --   --   --     GFR: Estimated Creatinine Clearance: 61.8 mL/min (by C-G formula based on SCr of 0.65 mg/dL). Liver Function Tests: Recent Labs  Lab 09/12/21 0254 09/13/21 0735 09/14/21 0335 09/15/21 0601  AST 18 32 29 46*  ALT 30 29 28  33  ALKPHOS 77 89 87 87  BILITOT 0.6 0.7 0.8 0.6  PROT 5.6* 5.3* 5.3* 5.2*  ALBUMIN 2.2* 2.0* 2.0* 1.9*    No results for input(s): "LIPASE", "AMYLASE" in the last 168 hours. Recent Labs  Lab 09/11/21 1638  AMMONIA 17    Coagulation Profile: No results for input(s): "INR", "PROTIME" in the last 168 hours. Cardiac Enzymes: No results for input(s): "CKTOTAL", "CKMB", "CKMBINDEX", "TROPONINI" in the last 168 hours. BNP (last 3 results) No results for input(s): "PROBNP" in the last 8760 hours. HbA1C: No results for input(s): "HGBA1C" in the last 72 hours. CBG: Recent Labs  Lab 09/17/21 0609  GLUCAP 105*    Lipid Profile: No results for input(s): "CHOL", "HDL", "LDLCALC", "TRIG", "CHOLHDL", "LDLDIRECT" in the last 72 hours. Thyroid Function Tests: Recent Labs    09/18/21 0408  TSH 1.942   Anemia Panel: No results for input(s): "VITAMINB12", "FOLATE", "FERRITIN", "TIBC", "IRON", "RETICCTPCT" in the last 72 hours. Sepsis Labs: Recent Labs  Lab 09/11/21 1638 09/11/21 1821  LATICACIDVEN 1.5 1.4     Recent Results (from the past 240 hour(s))  Blood culture (routine x 2)     Status: Abnormal   Collection Time: 09/09/21  3:17 PM   Specimen: BLOOD  Result Value Ref Range Status   Specimen Description BLOOD LEFT ANTECUBITAL  Final   Special Requests   Final    BOTTLES DRAWN AEROBIC AND ANAEROBIC Blood Culture adequate volume   Culture  Setup Time   Final    GRAM NEGATIVE RODS IN BOTH AEROBIC AND ANAEROBIC BOTTLES CRITICAL VALUE NOTED.  VALUE IS CONSISTENT WITH PREVIOUSLY  REPORTED AND CALLED VALUE.    Culture (A)  Final    PASTEURELLA MULTOCIDA Usually susceptible to penicillin and other beta lactam agents,quinolones,macrolides and tetracyclines. HEALTH DEPARTMENT NOTIFIED Performed at [facility], [place] [address] [place], [place] [address]    Report Status 09/12/2021 FINAL  Final  Blood culture (routine x 2)     Status: Abnormal   Collection Time: 09/09/21  3:22 PM   Specimen: BLOOD  Result Value Ref Range Status   Specimen Description BLOOD  RIGHT ANTECUBITAL  Final   Special Requests   Final    BOTTLES DRAWN AEROBIC AND ANAEROBIC Blood Culture adequate volume   Culture  Setup Time   Final    GRAM NEGATIVE RODS IN BOTH AEROBIC AND ANAEROBIC BOTTLES CRITICAL RESULT CALLED TO, READ BACK BY AND VERIFIED WITH: PHARMD HEATHER W [id number] [id number] FCP    Culture (A)  Final    PASTEURELLA MULTOCIDA Usually susceptible to penicillin and other beta lactam agents,quinolones,macrolides and tetracyclines. HEALTH DEPARTMENT NOTIFIED Performed at [facility], [place] [address], [place], [place] [address]    Report Status 09/18/2021 FINAL  Final  Blood Culture ID Panel (Reflexed)     Status: None   Collection Time: 09/09/21  3:22 PM  Result Value Ref Range Status   Enterococcus faecalis NOT DETECTED NOT DETECTED Final   Enterococcus Faecium NOT DETECTED NOT DETECTED Final   Listeria monocytogenes NOT DETECTED NOT DETECTED Final    Comment: CRITICAL RESULT CALLED TO, READ BACK BY AND VERIFIED WITH: PHARMD HEATHER W 1029 [id number] FCP    Staphylococcus species NOT DETECTED NOT DETECTED Final   Staphylococcus aureus (BCID) NOT DETECTED NOT DETECTED Final   Staphylococcus epidermidis NOT DETECTED NOT DETECTED Final   Staphylococcus lugdunensis NOT DETECTED NOT DETECTED Final   Streptococcus species NOT DETECTED NOT DETECTED Final   Streptococcus agalactiae NOT DETECTED NOT DETECTED Final   Streptococcus pneumoniae NOT DETECTED NOT DETECTED Final    Streptococcus pyogenes NOT DETECTED NOT DETECTED Final   A.calcoaceticus-baumannii NOT DETECTED NOT DETECTED Final   Bacteroides fragilis NOT DETECTED NOT DETECTED Final   Enterobacterales NOT DETECTED NOT DETECTED Final   Enterobacter cloacae complex NOT DETECTED NOT DETECTED Final   Escherichia coli NOT DETECTED NOT DETECTED Final   Klebsiella aerogenes NOT DETECTED NOT DETECTED Final   Klebsiella oxytoca NOT DETECTED NOT DETECTED Final   Klebsiella pneumoniae NOT DETECTED NOT DETECTED Final   Proteus species NOT DETECTED NOT DETECTED Final   Salmonella species NOT DETECTED NOT DETECTED Final   Serratia marcescens NOT DETECTED NOT DETECTED Final   Haemophilus influenzae NOT DETECTED NOT DETECTED Final   Neisseria meningitidis NOT DETECTED NOT DETECTED Final   Pseudomonas aeruginosa NOT DETECTED NOT DETECTED Final   Stenotrophomonas maltophilia NOT DETECTED NOT DETECTED Final   Candida albicans NOT DETECTED NOT DETECTED Final   Candida auris NOT DETECTED NOT DETECTED Final   Candida glabrata NOT DETECTED NOT DETECTED Final   Candida krusei NOT DETECTED NOT DETECTED Final   Candida parapsilosis NOT DETECTED NOT DETECTED Final   Candida tropicalis NOT DETECTED NOT DETECTED Final   Cryptococcus neoformans/gattii NOT DETECTED NOT DETECTED Final    Comment: Performed at [facility], [address], [place], [place] [address]  Expectorated Sputum Assessment w Gram Stain, Rflx to Resp Cult     Status: None   Collection Time: 09/10/21  1:31 PM   Specimen: Sputum  Result Value Ref Range Status   Specimen Description SPU  Final   Special Requests NONE  Final   Sputum evaluation   Final    Sputum specimen not acceptable for testing.  Please recollect.   Gram Stain Report Called to,Read Back By and Verified With: RN Mortimer Fries [phone] @2114  FH Performed at [facility], [place] [address] [place], [place] [address]    Report Status 09/12/2021 FINAL  Final  Urine Culture     Status:  None   Collection Time: 09/11/21 10:10 AM   Specimen: Urine, Catheterized  Result Value Ref Range Status   Specimen Description URINE, CATHETERIZED  Final   Special Requests Normal  Final   Culture   Final    NO GROWTH Performed at [facility], [address], [place], [place] [address]    Report Status 09/12/2021 FINAL  Final  Body fluid culture w Gram Stain     Status: None   Collection Time: 09/12/21  3:30 PM   Specimen: Pleural Fluid  Result Value Ref Range Status   Specimen Description PLEURAL  Final   Special Requests NONE  Final   Gram Stain NO WBC SEEN NO ORGANISMS SEEN   Final   Culture   Final    NO GROWTH 3 DAYS Performed at [facility], [place] [address] [place],  [address]    Report Status 09/16/2021 FINAL  Final         Radiology Studies: No results found.      Scheduled Meds:  amiodarone  200 mg Oral Daily   Chlorhexidine Gluconate Cloth  6 each Topical Daily   escitalopram  10 mg Oral Daily   feeding supplement  237 mL Oral BID BM   guaiFENesin  1,200 mg Oral BID   ipratropium-albuterol  3 mL Nebulization BID   lactulose  20 g Oral BID   lidocaine  1 patch Transdermal Q24H   magnesium gluconate  500 mg Oral Daily   metoprolol succinate  25 mg Oral Daily   naloxegol oxalate  25 mg Oral Daily   pantoprazole (PROTONIX) IV  40 mg Intravenous Q12H   polyethylene glycol  17 g Oral BID   rosuvastatin  5 mg Oral Daily   senna-docusate  1 tablet Oral BID   sodium chloride  1 g Oral Daily   sorbitol  60 mL Oral Once   Continuous Infusions:  sodium chloride 10 mL/hr at 09/11/21 1004   ampicillin-sulbactam (UNASYN) IV 3 g (09/18/21 1044)   methocarbamol (ROBAXIN) IV       LOS: 9 days    Time spent: 35 minutes.    Elmarie Shiley, MD [facility]   If 7PM-7AM, please contact night-coverage [email or web address]  09/18/2021, 12:26 PM

## 2021-09-18 NOTE — Progress Notes (Signed)
Palliative Medicine Progress Note   Patient Name: Patricia [name]       Date: 09/18/2021 DOB: Apr 01, 1950  Age: 71 y.o. MRN#: [id number] Attending Physician: Elmarie Shiley, MD Primary Care Physician: [name], MD Admit Date: 09/09/2021    HPI/Patient Profile: 71 y.o. female  with past medical history of small cell lung cancer diagnosed in 2022 s/p chemo and radiation, anxiety, atrial fibrillation, and hypertension.  She is experienced increased shortness of breath, fatigue, and several falls over the last 4 to 6 weeks resulting in rib fractures and vertebral fracture that was scheduled for kyphoplasty this week.  CT chest showed left pleural effusion and enlarged subcarinal lymph node.  She saw her oncologist Dr. Julien Nordmann on 8/29 and was sent for thoracentesis on 9/1.  750 mL fluid withdrawn prior to procedure was aborted due to coughing.  The next day on 9/2 she presented with worsened mental status and hypoxia to 85% on baseline home O2 3 L.  Patient admitted to [facility] for recurrent left pleural effusion.  Palliative Medicine was consulted for goals of care.  Subjective: Chart reviewed.  I visited patient and her husband Patricia [name] at bedside.  She reports her pain is well controlled at this time.  However, she reports mild shortness of breath, specifically that it is difficult to take a deep breath.  Her mental status is very clear today and much improved from when I saw her on Saturday.   We discussed plan of care moving forward.  [name] wants to know where she can go, and I reiterated that it depends on what is the goal of care.  Reviewed that if goal of care is comfort only, that she would likely be eligible for residential hospice facility.  Reviewed that all life prolonging interventions such  as IV fluid and antibiotics would not be continued upon transfer.  [name] verbalizes understanding.  Patricia [name] (after speaking with their daughters) has questions about additional options.  He inquires whether the infection has improved, [name]'s mental status has improved.  He wants to know what the specific criteria have to be met for her to be eligible for kyphoplasty.  I let patient and husband know I would order a chest x-ray and discuss with Dr. Tyrell Antonio.  Chest x-ray showed loculated pleural effusion, similar to prior imaging (reviewed by Dr.  Smith).   Dr. Tyrell Antonio discussed with neurosurgery, who stated there would need to be resolution of  para-pneumonic effusion for consideration of kyphoplasty. I returned to bedside and discussed this with [facility].  Reviewed that chest tube would be needed for effective treatment of infection and resolution of pleural effusion.  Anneka again states she does not want a chest tube.  We talked about a hospice facility and what that would look like.  [name] is very comfortable with the idea of a peaceful setting where she will have her symptoms managed and be cared for until she died.  Emotional support provided.    Objective:  Physical Exam Vitals reviewed.  Constitutional:      General: She is not in acute distress.    Appearance: She is ill-appearing.  Cardiovascular:     Rate and Rhythm: Normal rate.  Pulmonary:     Effort: Pulmonary effort is normal.  Neurological:     Mental Status: She is alert and oriented to person, place, and time.     Motor: Weakness present.             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   Prognosis: Very guarded  Discharge Planning: To Be Determined  Care plan was discussed with Dr. Tyrell Antonio and patient's RN  Thank you for allowing the Palliative Medicine Team to assist in the care of this patient.   MDM - High   Lavena Bullion, NP   Please contact Palliative Medicine Team phone at [phone] for questions and concerns.  For individual providers, please see AMION.

## 2021-09-18 NOTE — Plan of Care (Signed)
  Problem: Health Behavior/Discharge Planning: Goal: Ability to manage health-related needs will improve Outcome: Progressing   Problem: Clinical Measurements: Goal: Diagnostic test results will improve Outcome: Progressing Goal: Respiratory complications will improve Outcome: Progressing

## 2021-09-19 DIAGNOSIS — J90 Pleural effusion, not elsewhere classified: Secondary | ICD-10-CM | POA: Diagnosis not present

## 2021-09-19 LAB — CBC WITH DIFFERENTIAL/PLATELET
Abs Immature Granulocytes: 0.17 10*3/uL — ABNORMAL HIGH (ref 0.00–0.07)
Basophils Absolute: 0 10*3/uL (ref 0.0–0.1)
Basophils Relative: 0 %
Eosinophils Absolute: 0 10*3/uL (ref 0.0–0.5)
Eosinophils Relative: 0 %
HCT: 23.1 % — ABNORMAL LOW (ref 36.0–46.0)
Hemoglobin: 8.1 g/dL — ABNORMAL LOW (ref 12.0–15.0)
Immature Granulocytes: 2 %
Lymphocytes Relative: 2 %
Lymphs Abs: 0.2 10*3/uL — ABNORMAL LOW (ref 0.7–4.0)
MCH: 36.2 pg — ABNORMAL HIGH (ref 26.0–34.0)
MCHC: 35.1 g/dL (ref 30.0–36.0)
MCV: 103.1 fL — ABNORMAL HIGH (ref 80.0–100.0)
Monocytes Absolute: 0.4 10*3/uL (ref 0.1–1.0)
Monocytes Relative: 4 %
Neutro Abs: 9.8 10*3/uL — ABNORMAL HIGH (ref 1.7–7.7)
Neutrophils Relative %: 92 %
Platelets: 67 10*3/uL — ABNORMAL LOW (ref 150–400)
RBC: 2.24 MIL/uL — ABNORMAL LOW (ref 3.87–5.11)
RDW: 15.5 % (ref 11.5–15.5)
WBC: 10.6 10*3/uL — ABNORMAL HIGH (ref 4.0–10.5)
nRBC: 0 % (ref 0.0–0.2)

## 2021-09-19 MED ORDER — RISAQUAD PO CAPS
1.0000 | ORAL_CAPSULE | Freq: Every day | ORAL | Status: DC
  Administered 2021-09-19 – 2021-09-22 (×4): 1 via ORAL
  Filled 2021-09-19 (×4): qty 1

## 2021-09-19 MED ORDER — METOPROLOL SUCCINATE ER 25 MG PO TB24
25.0000 mg | ORAL_TABLET | Freq: Every day | ORAL | Status: DC
  Administered 2021-09-19 – 2021-09-23 (×5): 25 mg via ORAL
  Filled 2021-09-19 (×5): qty 1

## 2021-09-19 NOTE — Progress Notes (Signed)
Palliative Medicine Progress Note   Patient Name: Patricia [name]       Date: 09/19/2021 DOB: 01/02/1951  Age: 71 y.o. MRN#: [id number] Attending Physician: Patricia Shiley, MD Primary Care Physician: Patricia Frees, MD Admit Date: 09/09/2021    HPI/Patient Profile: 71 y.o. female  with past medical history of small cell lung cancer diagnosed in 2022 s/p chemo and radiation, anxiety, atrial fibrillation, and hypertension.  She is experienced increased shortness of breath, fatigue, and several falls over the last 4 to 6 weeks resulting in rib fractures and vertebral fracture that was scheduled for kyphoplasty this week.  CT chest showed left pleural effusion and enlarged subcarinal lymph node.  She saw her oncologist Dr. Julien [name] on 8/29 and was sent for thoracentesis on 9/1.  750 mL fluid withdrawn prior to procedure was aborted due to coughing.  The next day on 9/2 she presented with worsened mental status and hypoxia to 85% on baseline home O2 3 L.  Patient admitted to [facility] for recurrent left pleural effusion.  Palliative Medicine was consulted for goals of care.    Subjective: I visited Patricia [name] and Patricia [name] at bedside. Discussion was had regarding potential candidacy for kyphoplasty, which family had inquired about yesterday.   I explained to Patricia [name] (and later to patient's daughters by phone) that Dr. Tyrell Antonio discussed with neurosurgery, who stated there would need to be complete resolution of the effusion for consideration of kyphoplasty. Discussed that chest x-ray yesterday showed no change in the pleural effusion. Reviewed that pleural effusion will not resolve with antibiotics alone are not and that Patricia [name] would need chest tube placement to allow for drainage of the infection. Patricia [name] has been  consistent in stating she does not want a chest tube, however family is asking she reconsider as they do not feel it is a major procedure. Discussed that chest tube placement is usually done in IR (image guided), with local anesthetic in combination with light IV sedation. Discussed that chest tube would remain in place until effusion was resolved. Patricia [name] requests additional time to process. Discussed that the decision remains full scope treatment with chest tube placement or comfort care.  I strongly encouraged patient and family to come to a decision by Friday, mainly due to Patricia [name] tired of being in the hospital.   On the phone, I  was able to address daughters additional questions and concerns. They states they will ultimately support their mother's decision to pursue comfort care, although it obviously is not the outcome they wanted. Emotional support provided.    Objective:  Physical Exam Vitals reviewed.  Constitutional:      General: She is not in acute distress.    Appearance: She is ill-appearing.  Pulmonary:     Effort: Pulmonary effort is normal.  Neurological:     Mental Status: She is alert and oriented to person, place, and time.     Motor: Weakness present.             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)   Chronic respiratory failure with hypoxia (HCC)   Fracture of seventh thoracic vertebra (HCC)   DNR (do not  resuscitate)/DNI(Do Not Intubate)   Dysarthria   Bacteremia   Loculated pleural effusion   SBO (small bowel obstruction) (HCC)    Recommendations/Plan: DNR/DNI as previously documented Continue current supportive care Encouraged pt/family to come to a decision by Friday if possible - full scope care with chest tube placement versus comfort care  I will follow up with patient and family when I return to service on Thursday 9/14  Prognosis: Very guarded  Discharge Planning: To Be Determined  Care plan was discussed with Dr. Tyrell Antonio   Thank you for allowing the Palliative Medicine Team to assist in the care of this patient.   Greater than 50%  of this time was spent counseling and coordinating care related to the above assessment and plan.  Total time: 87 minutes   Lavena Bullion, NP Palliative Medicine   Please contact Palliative Medicine Team phone at [phone] for questions and concerns.  For individual provider, see AMION.

## 2021-09-19 NOTE — Progress Notes (Signed)
Pt stated taking her metoprolol at bedtime, and would like to have it changed.  MD paged to make aware.

## 2021-09-19 NOTE — Progress Notes (Addendum)
PROGRESS NOTE    Patricia [name]  GQQ:[id number] DOB: [birth year] DOA: 09/09/2021 PCP: [name], MD   Brief Narrative: 71 year old with past medical history significant for small cell lung cancer on the left side, A-fib, recent vertebral fracture awaiting kyphoplasty, pleural effusion status postthoracentesis the day prior to admission.  She presented to the ED complaining of worsening shortness of breath and with altered mental status.  After she had thoracentesis, she went home family had difficulty waking her up.  She took some extra sleeping medicine, Klonopin, Soma, Atarax.  Patient was found to be hypoxic with oxygen saturation 85 room air at home.  Evaluation in the ED she was tachycardic, she was satting 94% on 4 L.  CT chest showed moderate left pleural effusion with near complete consolidation of the left lower lobe and partial consolidation of the lingula, interval resolution of previously seen right pleural effusion.  Admitted with acute hypoxic respiratory failure in the setting of pleural effusion. Neurologist was consulted and patient underwent thoracentesis yielding 200 cc of clear but hemorrhagic fluid.  Patient found to have parapneumonic effusion.  Dr. Katy Fitch discussed with patient, patient would like to proceed with more comfort care approach.   Palliative care consulted, family meeting 9/08, see palliative care PN 9/08. Plan to continue with support care, family/patient considering transition to full comfort care, palliative to discussed further 9/12.  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  Total nucleated cell 2,650. Cytology: No malignant cells/  WBC normalized.  Patient had a long conversation with Dr. Katy Fitch, she is interested in  hospice care.   If patient and family wishes to proceed with further treatment she will require a chest tube placement and fibrinolysis for resolution of infection.  Patient would like hospice care.  Palliative care consulted, family is not ready to transition to comfort care. Palliative care meeting 9/12. Chest x ray same as before. With loculated effusion, lingula and left lower lobe opacification and volume loss.   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  Source not apparent, but they have dog at home.  She will need 21 days Augmentin per pulmonology after discharge if discharge home, rehab.   Small cell carcinoma of lower lobe on the left, Stage T2a, N2,M0 at time of diagnosis 10/2020. Status post chemoradiation Oncology concerned about enlarged pulmonary lymph nodes. Possible metastasis to LN.- outpatient PET/CT  Dysarthria: Initial CT head negative Patient is stop her Eliquis 48 hours ago in preparation for kyphoplasty for her vertebral fracture. Patient with nasal quality to voice, not her usual speech pattern. MRI; 9/3: No acute intracranial abnormality.  Mild sequela of whole brain radiation with noted strong evidence of metastatic disease in this noncontrast exam.  Fracture of seventh thoracic vertebra She was  scheduled for kyphoplasty this week.  With active infection, no safe to proceed with kyphoplasty Discussed with PA (neurosurgery)  Viona Gilmore, patient needs resolution of Pneumonia, loculated pleural  effusion to consider kyphoplasty.  I also spoke with Dr Laurence Ferrari with IR who said patient will need  to be clear from Bacteremia but also would need resolution of loculated pleural effusion, because of recommendation for chest tube.   A-fib: Patient is against chest tube.  Continue with eliquis, continue to monitor hb and platelet.   Hypertension: Continue with Toprol  Constipation, SBO vs Ileus;  Patient remove NGT .  KUB less small bowel dilation, stool thorough out colon.  Had BM 8/7 ---large BM 9/08 Continue with senna, started  miralax. Lactulose.  On regular diet.   Hypokalemia; Replaced.   Hypomagnesemia. Tolerating Magnesium gluconate.  Mild hyponatremia; monitor. Resume sodium tablet.  Acute delirium; patient more confuse this am. Suspect related to klonopin. She is alert and conversant this am. Oriented to person, place situation. Will discontinue Klonopin. Melatonin order PRN Ammonia level normal. Plan to check TSH  Chronic Diastolic HF; compensated,.   Estimated body mass index is 23.13 kg/m as calculated from the following:   Height as of this encounter: 5\' 7"  (1.702 m).   Weight as of this encounter: 67 kg.   DVT prophylaxis: Heparin  Code Status: DNR Family Communication: Daughter 9/10 Disposition Plan:  Status is: Inpatient Remains inpatient appropriate because: management pleural effusion, Ileus.     Consultants:  Pulmonology   Procedures:  9/1 Thoracentesis: 9/4 spoke at length with lab and they never received sample for processing 9/2 CT chest W0 contrast -Overall no significant interval change in the appearance of the lungs since the prior CT of 09/04/2021. -Moderate left pleural effusion with near complete consolidation LLL/partial consolidation  lingula similar to prior CT. -Post radiation changes LUL similar to prior CT. -Interval resolution  right pleural effusion. -Fractures of the posterolateral ninth and eleventh rib on right.  No new fracture. 9/4 DG CXR: -interval complete opacification left hemithorax suggesting atelectasis in left  lung, possibly due to central obstructing mucous plug. Part of this opacity is due to left pleural effusion. -Small patchy infiltrate in right lower lung field may suggest atelectasis/pneumonia. There is no pneumothorax. 9/4 KUB There is increased amount of gas which appears to be mostly in the colon suggesting possible ileus or distal colonic obstruction.  9/5 LEFT Thoracentesis 238ml Aspirated  Antimicrobials:    Subjective: She continue to have SOB, I explain to her is likely related to pleural effusion, infiltrates lung. She said she is ready for comfort care.  Awaiting palliative care meeting with family today.  She is oriented.   Objective: Vitals:   09/19/21 0645 09/19/21 0800 09/19/21 0819 09/19/21 0858  BP:  128/81    Pulse:  73    Resp:  20    Temp:      TempSrc:      SpO2:   92% 99%  Weight: 67 kg     Height:        Intake/Output Summary (Last 24 hours) at 09/19/2021 1252 Last data filed at 09/19/2021 0600 Gross per 24 hour  Intake 340 ml  Output 602 ml  Net -262 ml    Filed Weights   09/16/21 2358 09/18/21 0400 09/19/21 0645  Weight: 70.1 kg 66.6 kg 67 kg    Examination:  General exam: NAD Respiratory system: BL air movement.  Cardiovascular system: S 1 S 2 RRR Gastrointestinal system: BS present, soft,  Central nervous system ; alert, conversant.  Extremities: no edema    Data Reviewed: I have personally reviewed following labs and imaging studies  CBC: Recent Labs  Lab 09/15/21 0601 09/16/21 0320 09/17/21 0606 09/18/21 0408 09/18/21 1150 09/19/21 0454  WBC 10.4 9.2 10.3 9.8  --  10.6*  NEUTROABS 9.3* 8.3* 9.5* 9.0*  --  9.8*  HGB 8.8* 9.3* 9.5* 7.9* 9.2* 8.1*  HCT 25.1* 26.1* 27.8* 23.2* 26.7* 23.1*  MCV 103.3* 101.2* 103.3* 104.0*  --  103.1*  PLT 98* 98* 90* 71*  --  67*    Basic Metabolic Panel: Recent Labs  Lab 09/13/21 0735 09/14/21 0335 09/15/21 0601 09/16/21 0320 09/16/21 1256 09/17/21 0741 09/18/21 0408  NA 134*  134* 132*  --  133* 135 133*  K 3.7 3.6 3.2*  --  2.6* 4.1 3.7  CL 97* 97* 94*  --  93* 97* 99  CO2 25 24 25   --  25 24 26   GLUCOSE 98 89 95  --  123* 105* 103*  BUN 26* 27* 23  --  19 17 20   CREATININE 0.76 0.79 0.65  --  0.61 0.63 0.65  CALCIUM 8.4* 8.3* 8.2*  --  8.0* 8.0* 7.9*  MG 1.7 1.9 1.7 1.7  --   --   --   PHOS 3.2 3.4 3.0 2.8  --   --   --     GFR: Estimated Creatinine Clearance: 61.8 mL/min (by C-G formula based on SCr of 0.65 mg/dL). Liver Function Tests: Recent Labs  Lab 09/13/21 0735 09/14/21 0335 09/15/21 0601  AST 32 29 46*  ALT 29 28 33  ALKPHOS 89 87 87  BILITOT 0.7 0.8 0.6  PROT 5.3* 5.3* 5.2*  ALBUMIN 2.0* 2.0* 1.9*    No results for input(s): "LIPASE", "AMYLASE" in the last 168 hours. No results for input(s): "AMMONIA" in the last 168 hours.  Coagulation Profile: No results for input(s): "INR", "PROTIME" in the last 168 hours. Cardiac Enzymes: No results for input(s): "CKTOTAL", "CKMB", "CKMBINDEX", "TROPONINI" in the last 168 hours. BNP (last 3 results) No results for input(s): "PROBNP" in the last 8760 hours. HbA1C: No results for input(s): "HGBA1C" in the last 72 hours. CBG: Recent Labs  Lab 09/17/21 0609  GLUCAP 105*    Lipid Profile: No results for input(s): "CHOL", "HDL", "LDLCALC", "TRIG", "CHOLHDL", "LDLDIRECT" in the last 72 hours. Thyroid Function Tests: Recent Labs    09/18/21 0408  TSH 1.942    Anemia Panel: No results for input(s): "VITAMINB12", "FOLATE", "FERRITIN", "TIBC", "IRON", "RETICCTPCT" in the last 72 hours. Sepsis Labs: No results for input(s): "PROCALCITON", "LATICACIDVEN" in the last 168 hours.   Recent Results (from the past 240 hour(s))  Blood culture (routine x 2)     Status: Abnormal   Collection Time: 09/09/21  3:17 PM   Specimen: BLOOD  Result Value Ref Range Status   Specimen Description BLOOD LEFT ANTECUBITAL  Final   Special Requests   Final    BOTTLES DRAWN AEROBIC AND ANAEROBIC Blood Culture  adequate volume   Culture  Setup Time   Final    GRAM NEGATIVE RODS IN BOTH AEROBIC AND ANAEROBIC BOTTLES CRITICAL VALUE NOTED.  VALUE IS CONSISTENT WITH PREVIOUSLY REPORTED AND CALLED VALUE.    Culture (A)  Final    PASTEURELLA MULTOCIDA Usually susceptible to penicillin and other beta lactam agents,quinolones,macrolides and tetracyclines. HEALTH DEPARTMENT NOTIFIED Performed at [facility], [place] [address], [place], [place] [address]    Report Status 09/12/2021 FINAL  Final  Blood culture (routine x 2)     Status: Abnormal   Collection Time: 09/09/21  3:22  PM   Specimen: BLOOD  Result Value Ref Range Status   Specimen Description BLOOD RIGHT ANTECUBITAL  Final   Special Requests   Final    BOTTLES DRAWN AEROBIC AND ANAEROBIC Blood Culture adequate volume   Culture  Setup Time   Final    GRAM NEGATIVE RODS IN BOTH AEROBIC AND ANAEROBIC BOTTLES CRITICAL RESULT CALLED TO, READ BACK BY AND VERIFIED WITH: PHARMD HEATHER W [id number] FCP    Culture (A)  Final    PASTEURELLA MULTOCIDA Usually susceptible to penicillin and other beta lactam agents,quinolones,macrolides and tetracyclines. HEALTH DEPARTMENT NOTIFIED Performed at [facility], [place] [address] [place], [place] [address]    Report Status 09/18/2021 FINAL  Final  Blood Culture ID Panel (Reflexed)     Status: None   Collection Time: 09/09/21  3:22 PM  Result Value Ref Range Status   Enterococcus faecalis NOT DETECTED NOT DETECTED Final   Enterococcus Faecium NOT DETECTED NOT DETECTED Final   Listeria monocytogenes NOT DETECTED NOT DETECTED Final    Comment: CRITICAL RESULT CALLED TO, READ BACK BY AND VERIFIED WITH: PHARMD HEATHER W [id number] FCP    Staphylococcus species NOT DETECTED NOT DETECTED Final   Staphylococcus aureus (BCID) NOT DETECTED NOT DETECTED Final   Staphylococcus epidermidis NOT DETECTED NOT DETECTED Final   Staphylococcus lugdunensis NOT DETECTED NOT DETECTED Final   Streptococcus  species NOT DETECTED NOT DETECTED Final   Streptococcus agalactiae NOT DETECTED NOT DETECTED Final   Streptococcus pneumoniae NOT DETECTED NOT DETECTED Final   Streptococcus pyogenes NOT DETECTED NOT DETECTED Final   A.calcoaceticus-baumannii NOT DETECTED NOT DETECTED Final   Bacteroides fragilis NOT DETECTED NOT DETECTED Final   Enterobacterales NOT DETECTED NOT DETECTED Final   Enterobacter cloacae complex NOT DETECTED NOT DETECTED Final   Escherichia coli NOT DETECTED NOT DETECTED Final   Klebsiella aerogenes NOT DETECTED NOT DETECTED Final   Klebsiella oxytoca NOT DETECTED NOT DETECTED Final   Klebsiella pneumoniae NOT DETECTED NOT DETECTED Final   Proteus species NOT DETECTED NOT DETECTED Final   Salmonella species NOT DETECTED NOT DETECTED Final   Serratia marcescens NOT DETECTED NOT DETECTED Final   Haemophilus influenzae NOT DETECTED NOT DETECTED Final   Neisseria meningitidis NOT DETECTED NOT DETECTED Final   Pseudomonas aeruginosa NOT DETECTED NOT DETECTED Final   Stenotrophomonas maltophilia NOT DETECTED NOT DETECTED Final   Candida albicans NOT DETECTED NOT DETECTED Final   Candida auris NOT DETECTED NOT DETECTED Final   Candida glabrata NOT DETECTED NOT DETECTED Final   Candida krusei NOT DETECTED NOT DETECTED Final   Candida parapsilosis NOT DETECTED NOT DETECTED Final   Candida tropicalis NOT DETECTED NOT DETECTED Final   Cryptococcus neoformans/gattii NOT DETECTED NOT DETECTED Final    Comment: Performed at [facility], [address] [place], [place] [address]  Expectorated Sputum Assessment w Gram Stain, Rflx to Resp Cult     Status: None   Collection Time: 09/10/21  1:31 PM   Specimen: Sputum  Result Value Ref Range Status   Specimen Description SPU  Final   Special Requests NONE  Final   Sputum evaluation   Final    Sputum specimen not acceptable for testing.  Please recollect.   Gram Stain Report Called to,Read Back By and Verified With: RN ACorinna Capra  [phone] @2114  FH Performed at [facility], [place] [address], [place], [place] [address]    Report Status 09/12/2021 FINAL  Final  Urine Culture  Status: None   Collection Time: 09/11/21 10:10 AM   Specimen: Urine, Catheterized  Result Value Ref Range Status   Specimen Description URINE, CATHETERIZED  Final   Special Requests Normal  Final   Culture   Final    NO GROWTH Performed at [facility], [address], [place], [place] [address]    Report Status 09/12/2021 FINAL  Final  Body fluid culture w Gram Stain     Status: None   Collection Time: 09/12/21  3:30 PM   Specimen: Pleural Fluid  Result Value Ref Range Status   Specimen Description PLEURAL  Final   Special Requests NONE  Final   Gram Stain NO WBC SEEN NO ORGANISMS SEEN   Final   Culture   Final    NO GROWTH 3 DAYS Performed at [facility], [place] [address] [place], [place] [address]    Report Status 09/16/2021 FINAL  Final         Radiology Studies: DG CHEST PORT 1 VIEW  Result Date: 09/18/2021 CLINICAL DATA:  Shortness of breath EXAM: PORTABLE CHEST 1 VIEW COMPARISON:  AP chest 09/12/2021, chest two views 09/11/2021, CT chest 09/09/2021 FINDINGS: There is partial aeration of the left mid to upper lung. There is again homogeneous opacification of the inferior 50% of the left hemithorax. Less dense but fairly homogeneous opacification of the left mid to upper left lung with some areas of aeration consistent with the large posterior pleural effusion and overlying aerated left upper lobe and lingula portions of the anterior left lung on prior CT. Mild right interstitial thickening is similar to prior. Increased upper lung lucencies within the right lung again consistent with the bilateral emphysematous changes seen on prior CT. No pneumothorax is seen. The visualized cardiac silhouette again appears mildly enlarged. Mediastinal contours are grossly unremarkable with moderate calcification within the  aortic arch. Moderate multilevel degenerative disc changes of the thoracic spine. Kyphoplasty cement within the approximate L2 vertebral body level where there is mild height loss. IMPRESSION: Redemonstration of homogeneous opacification of the inferior greater than superior aspect of the left lung consistent with the inferior greater than superior multiloculated left pleural effusion combined with lingular and left lower lobe airspace opacification and volume loss better seen on prior CT. Electronically Signed   By: Yvonne Kendall M.D.   On: 09/18/2021 14:30        Scheduled Meds:  acidophilus  1 capsule Oral Daily   amiodarone  200 mg Oral Daily   apixaban  5 mg Oral BID   Chlorhexidine Gluconate Cloth  6 each Topical Daily   escitalopram  10 mg Oral Daily   feeding supplement  237 mL Oral BID BM   guaiFENesin  1,200 mg Oral BID   ipratropium-albuterol  3 mL Nebulization BID   lactulose  20 g Oral BID   lidocaine  1 patch Transdermal Q24H   magnesium gluconate  500 mg Oral Daily   metoprolol succinate  25 mg Oral Daily   naloxegol oxalate  25 mg Oral Daily   pantoprazole (PROTONIX) IV  40 mg Intravenous Q12H   polyethylene glycol  17 g Oral BID   rosuvastatin  5 mg Oral Daily   senna-docusate  1 tablet Oral BID   sodium chloride  1 g Oral Daily   sorbitol  60 mL Oral Once   Continuous Infusions:  sodium chloride 10 mL/hr at 09/11/21 1004   ampicillin-sulbactam (UNASYN) IV 3 g (09/19/21 1118)  methocarbamol (ROBAXIN) IV       LOS: 10 days    Time spent: 35 minutes.    Elmarie Shiley, MD [facility]   If 7PM-7AM, please contact night-coverage [email or web address]  09/19/2021, 12:52 PM

## 2021-09-20 DIAGNOSIS — I480 Paroxysmal atrial fibrillation: Secondary | ICD-10-CM | POA: Diagnosis not present

## 2021-09-20 DIAGNOSIS — R7881 Bacteremia: Secondary | ICD-10-CM | POA: Diagnosis not present

## 2021-09-20 DIAGNOSIS — J90 Pleural effusion, not elsewhere classified: Secondary | ICD-10-CM | POA: Diagnosis not present

## 2021-09-20 DIAGNOSIS — R471 Dysarthria and anarthria: Secondary | ICD-10-CM | POA: Diagnosis not present

## 2021-09-20 LAB — CBC WITH DIFFERENTIAL/PLATELET
Abs Immature Granulocytes: 0.12 10*3/uL — ABNORMAL HIGH (ref 0.00–0.07)
Basophils Absolute: 0 10*3/uL (ref 0.0–0.1)
Basophils Relative: 0 %
Eosinophils Absolute: 0 10*3/uL (ref 0.0–0.5)
Eosinophils Relative: 0 %
HCT: 23.3 % — ABNORMAL LOW (ref 36.0–46.0)
Hemoglobin: 8 g/dL — ABNORMAL LOW (ref 12.0–15.0)
Immature Granulocytes: 1 %
Lymphocytes Relative: 2 %
Lymphs Abs: 0.2 10*3/uL — ABNORMAL LOW (ref 0.7–4.0)
MCH: 35.9 pg — ABNORMAL HIGH (ref 26.0–34.0)
MCHC: 34.3 g/dL (ref 30.0–36.0)
MCV: 104.5 fL — ABNORMAL HIGH (ref 80.0–100.0)
Monocytes Absolute: 0.4 10*3/uL (ref 0.1–1.0)
Monocytes Relative: 4 %
Neutro Abs: 10.1 10*3/uL — ABNORMAL HIGH (ref 1.7–7.7)
Neutrophils Relative %: 93 %
Platelets: 72 10*3/uL — ABNORMAL LOW (ref 150–400)
RBC: 2.23 MIL/uL — ABNORMAL LOW (ref 3.87–5.11)
RDW: 15.4 % (ref 11.5–15.5)
WBC: 10.9 10*3/uL — ABNORMAL HIGH (ref 4.0–10.5)
nRBC: 0 % (ref 0.0–0.2)

## 2021-09-20 LAB — BASIC METABOLIC PANEL
Anion gap: 9 (ref 5–15)
BUN: 15 mg/dL (ref 8–23)
CO2: 27 mmol/L (ref 22–32)
Calcium: 8 mg/dL — ABNORMAL LOW (ref 8.9–10.3)
Chloride: 100 mmol/L (ref 98–111)
Creatinine, Ser: 0.55 mg/dL (ref 0.44–1.00)
GFR, Estimated: >60 mL/min (ref >60)
Glucose, Bld: 97 mg/dL (ref 70–99)
Potassium: 3.5 mmol/L (ref 3.5–5.1)
Sodium: 136 mmol/L (ref 135–145)

## 2021-09-20 NOTE — TOC Progression Note (Signed)
Transition of Care [facility]) - Progression Note    Patient Details  Name: Patricia [name] MRN: [id number] Date of Birth: 04/19/1950  Transition of Care [facility]) CM/SW Contact  Zenon Mayo, RN Phone Number: 09/20/2021, 12:22 PM  Clinical Narrative:    Palliative continues in discussion with family, hopeful for family to make a decision by Friday per Palliative note. Full scope care with chest tube placement versus comfort care.  TOC following.           Expected Discharge Plan and Services                                                 Social Determinants of Health (SDOH) Interventions    Readmission Risk Interventions    02/13/2021    1:15 PM 10/17/2020    1:03 PM  Readmission Risk Prevention Plan  Post Dischage Appt  Complete  Medication Screening  Complete  Transportation Screening Complete Complete  Medication Review ([place]) Complete   PCP or Specialist appointment within 3-5 days of discharge Complete   HRI or [place] Complete   SW Recovery Care/Counseling Consult Complete   [place] Not Applicable

## 2021-09-20 NOTE — Hospital Course (Addendum)
Patricia [name] is a 71 y.o. female with a history of small cell lung cancer, atrial fibrillation, T7 vertebbral fracture, pleural effusion. Patient presented secondary to shortness of breath and altered mental status secondary to hypoxia from recurrent effusion. Patient found to have evidence of parapneumonic effusion in addition to bacteremia. Patient managed on Unasyn for treatment. Chest tube offered, but patient declined. Palliative care consulted for continued goals of care discussions, which took a few days before the decision was made to pursue hospice on discharge.

## 2021-09-20 NOTE — Progress Notes (Signed)
PROGRESS NOTE    Patricia [name]  FIE:[id number] DOB: 02-09-1950 DOA: 09/09/2021 PCP: [name], MD   Brief Narrative: No notes on file   Assessment and Plan:  Left-sided parapneumonic effusion Pulmonology consulted. Patient underwent thoracentesis on 9/1 and repeat thoracentesis on 9/5. No malignant cells or bacteria noted on fluid analysis, however PMN count of 2,650. Recommendation for chest tube placement, however patient has deferred this for now secondary to quality of life and goals of care concerns. Palliative care is consulted. Patient is considering cessation of active treatment and focus on comfort measures with possible plan to transfer to hospice facility.  Small cell lung cancer Patient follows with Dr. Julien Nordmann as an outpatient. History of chemotherapy. Patient also received prophylactic whole brain radiation. 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   Pasturella multocida bacteremia Unknown source; possibly lung although pleural fluid cultures were negative. -Continue Unasyn IV  Dysarthria CT head negative for acute process. MRI without acute process. Dysarthria appears improved.  Acute respiratory failure with hypoxia Secondary to parapneumonic effusion. Patient on 4 L/min of supplemental oxygen -Wean to room air as able  T7 vertebral fracture Patient with prior plan for kyphoplasty which is deferred secondary to active infection.  Atrial fibrillation Eliquis continued secondary to patient's decline for chest tube insertion. Patient is also on Toprol XL and amiodarone -Continue Toprol XL, amiodarone and Eliquis  Primary hypertension -Continue Toprol XL  Constipation Ileus Patient managed with bowel regimen, but required management with an NG tube a/ intermittent suction. Ileus appears resolved.  Hypomagnesemia -Continue supplementation  Hyponatremia Mild. Started on sodium tablets with improvement.  Asymptomatic. 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 -Continue sodium tablets for now; may need to discontinue.  Delirium Appears resolved.  Chronic diastolic heart failure Stable and compensated.   DVT prophylaxis: Eliquis Code Status:   Code Status: DNR Family Communication: Husband at bedside Disposition Plan: Discharge pending continued goals of care discussion vs continued active management of effusion/bacteremia   Consultants:  PCCM Interventional radiology Palliative care medicine  Procedures:  Thoracentesis  Antimicrobials: Ceftriaxone Cefepime Azithromycin Unasyn    Subjective: Patient reports no dyspnea or chest pain. Currently getting a mani-pedi.  Objective: BP 111/76   Pulse 79   Temp 98.6 F (37 C) (Oral)   Resp 20   Ht '5\' 7"'  (1.702 m)   Wt 67.9 kg   LMP  (LMP Unknown)   SpO2 99%   BMI 23.45 kg/m   Examination:  General exam: Appears calm and comfortable Respiratory system: Clear to auscultation on right with rales and decreased breath sounds on left. Respiratory effort normal. Cardiovascular system: S1 & S2 heard, irregular rhythm, normal rate. Gastrointestinal system: Abdomen is nondistended, soft and nontender. Normal bowel sounds heard. Central nervous system: Alert and oriented. No focal neurological deficits. Musculoskeletal: No calf tenderness Psychiatry: Judgement and insight appear normal. Mood & affect appropriate.    Data Reviewed: I have personally reviewed following labs and imaging studies  CBC Lab Results  Component Value Date   WBC 10.9 (H) 09/20/2021   RBC 2.23 (L) 09/20/2021   HGB 8.0 (L) 09/20/2021   HCT 23.3 (L) 09/20/2021   MCV 104.5 (H) 09/20/2021   MCH 35.9 (H) 09/20/2021   PLT 72 (L) 09/20/2021   MCHC 34.3 09/20/2021   RDW 15.4 09/20/2021   LYMPHSABS 0.2 (L) 09/20/2021   MONOABS 0.4 09/20/2021   EOSABS 0.0 09/20/2021   BASOSABS 0.0 09/20/2021  Last metabolic panel Lab Results   Component Value Date   NA 136 09/20/2021   K 3.5 09/20/2021   CL 100 09/20/2021   CO2 27 09/20/2021   BUN 15 09/20/2021   CREATININE 0.55 09/20/2021   GLUCOSE 97 09/20/2021   GFRNONAA >60 09/20/2021   GFRAA  02/22/2009    >60        The eGFR has been calculated using the MDRD equation. This calculation has not been validated in all clinical situations. eGFR's persistently <60 mL/min signify possible Chronic Kidney Disease.   CALCIUM 8.0 (L) 09/20/2021   PHOS 2.8 09/16/2021   PROT 5.2 (L) 09/15/2021   ALBUMIN 1.9 (L) 09/15/2021   BILITOT 0.6 09/15/2021   ALKPHOS 87 09/15/2021   AST 46 (H) 09/15/2021   ALT 33 09/15/2021   ANIONGAP 9 09/20/2021    GFR: Estimated Creatinine Clearance: 61.8 mL/min (by C-G formula based on SCr of 0.55 mg/dL).  Recent Results (from the past 240 hour(s))  Urine Culture     Status: None   Collection Time: 09/11/21 10:10 AM   Specimen: Urine, Catheterized  Result Value Ref Range Status   Specimen Description URINE, CATHETERIZED  Final   Special Requests Normal  Final   Culture   Final    NO GROWTH Performed at [facility], [address] [place], [place] [address]    Report Status 09/12/2021 FINAL  Final  Body fluid culture w Gram Stain     Status: None   Collection Time: 09/12/21  3:30 PM   Specimen: Pleural Fluid  Result Value Ref Range Status   Specimen Description PLEURAL  Final   Special Requests NONE  Final   Gram Stain NO WBC SEEN NO ORGANISMS SEEN   Final   Culture   Final    NO GROWTH 3 DAYS Performed at [facility], [place] [address], [place], [place] [address]    Report Status 09/16/2021 FINAL  Final      Radiology Studies: No results found.    LOS: 11 days    Cordelia Poche, MD [facility] 09/20/2021, 7:01 PM   If 7PM-7AM, please contact night-coverage [email or web address]

## 2021-09-20 NOTE — TOC Initial Note (Signed)
Transition of Care [facility]) - Initial/Assessment Note    Patient Details  Name: Patricia [name] MRN: [id number] Date of Birth: [birth year]  [facility] CM/SW Contact:    Zenon Mayo, RN Phone Number: 09/20/2021, 12:39 PM  Clinical Narrative:                 Palliative continues in discussion with family, hopeful for family to make a decision by Friday per Palliative note. Full scope care with chest tube placement versus comfort care.  TOC following.  Expected Discharge Plan: [place] Barriers to Discharge: Continued Medical Work up   Patient Goals and CMS Choice Patient states their goals for this hospitalization and ongoing recovery are:: return home   Choice offered to / list presented to : NA  Expected Discharge Plan and Services Expected Discharge Plan: [place]   Discharge Planning Services: CM Consult                       DME Agency: NA                  Prior Living Arrangements/Services   Lives with:: Spouse   Do you feel safe going back to the place where you live?: Yes      Need for Family Participation in Patient Care: Yes (Comment) Care giver support system in place?: Yes (comment)   Criminal Activity/Legal Involvement Pertinent to Current Situation/Hospitalization: No - Comment as needed  Activities of Daily Living Home Assistive Devices/Equipment: Oxygen, Wheelchair, Shower chair with back, Hand-held shower hose, Bedside commode/3-in-1, Eyeglasses ADL Screening (condition at time of admission) Patient's cognitive ability adequate to safely complete daily activities?: No Is the patient deaf or have difficulty hearing?: Yes Does the patient have difficulty seeing, even when wearing glasses/contacts?: No Does the patient have difficulty concentrating, remembering, or making decisions?: No Patient able to express need for assistance with ADLs?: Yes Does the patient have difficulty dressing  or bathing?: Yes Independently performs ADLs?: No Communication: Independent Dressing (OT): Needs assistance Is this a change from baseline?: Change from baseline, expected to last <3days Grooming: Dependent Is this a change from baseline?: Change from baseline, expected to last <3 days Feeding: Needs assistance Is this a change from baseline?: Change from baseline, expected to last >3 days Bathing: Dependent Is this a change from baseline?: Pre-admission baseline Toileting: Needs assistance Is this a change from baseline?: Pre-admission baseline In/Out Bed: Needs assistance Is this a change from baseline?: Pre-admission baseline Walks in Home: Needs assistance Is this a change from baseline?: Pre-admission baseline Does the patient have difficulty walking or climbing stairs?: Yes Weakness of Legs: Both Weakness of Arms/Hands: Both  Permission Sought/Granted                  Emotional Assessment       Orientation: : Oriented to Self, Oriented to Place, Oriented to  Time, Oriented to Situation Alcohol / Substance Use: Not Applicable Psych Involvement: No (comment)  Admission diagnosis:  Recurrent left pleural effusion [J90] Patient Active Problem List   Diagnosis Date Noted   Loculated pleural effusion 09/11/2021   SBO (small bowel obstruction) ([place]) 09/11/2021   Bacteremia 09/10/2021   Recurrent left pleural effusion 09/09/2021   Fracture of seventh thoracic vertebra ([place]) 09/09/2021   DNR (do not resuscitate)/DNI(Do Not Intubate) 09/09/2021   Dysarthria 09/09/2021   Malnutrition of moderate degree 07/07/2021   Acute on chronic congestive  heart failure (HCC)    Atrial flutter with rapid ventricular response ([place]) 02/14/2021   Paroxysmal atrial fibrillation with RVR (HCC) 02/12/2021   SIADH (syndrome of inappropriate ADH production) ([place]) 02/11/2021   Chronic respiratory failure with hypoxia ([place]) 02/11/2021   Acute on chronic respiratory failure with hypoxia ([place])   02/11/2021   Bilateral pleural effusion 02/11/2021   Acute on chronic diastolic CHF (congestive heart failure) ([place]) 95/09/3265   Complicated UTI (urinary tract infection) 12/25/2020   Dyspnea 12/25/2020   Dyslipidemia 12/25/2020   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   Antineoplastic chemotherapy induced pancytopenia ([place]) 12/25/2020   Esophagitis 12/25/2020   Encounter for antineoplastic chemotherapy 12/12/2020   Chemotherapy induced neutropenia ([place]) 11/14/2020   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 ([place]) 10/21/2020   Lung nodule 10/18/2020   Anxiety    AF (paroxysmal atrial fibrillation) (HCC)    Paroxysmal SVT (supraventricular tachycardia) (HCC)    Chronic pain    Hyponatremia 10/01/2020   Essential hypertension 10/01/2020   Syncope 09/30/2020   Asthmatic bronchitis 08/11/2020   Colles' fracture of right radius, initial encounter for closed fracture 11/19/2019   PCP:  [name], MD Pharmacy:   [facility] [place], [place] AT [address] [address] Phone: [phone] Fax: [phone]     Social Determinants of Health (SDOH) Interventions    Readmission Risk Interventions    09/20/2021   12:36 PM 02/13/2021    1:15 PM 10/17/2020    1:03 PM  Readmission Risk Prevention Plan  Post Dischage Appt   Complete  Medication Screening   Complete  Transportation Screening  Complete Complete  Medication Review Press photographer) Complete Complete   PCP or Specialist appointment within 3-5 days of discharge Complete Complete   HRI or Home Care Consult Complete Complete   SW Recovery Care/Counseling Consult Complete Complete   Palliative Care Screening Complete Not [place] Not Applicable Not Applicable

## 2021-09-21 ENCOUNTER — Inpatient Hospital Stay: Payer: Medicare HMO | Admitting: Physician Assistant

## 2021-09-21 DIAGNOSIS — J9611 Chronic respiratory failure with hypoxia: Secondary | ICD-10-CM | POA: Diagnosis not present

## 2021-09-21 DIAGNOSIS — S22060A Wedge compression fracture of T7-T8 vertebra, initial encounter for closed fracture: Secondary | ICD-10-CM | POA: Diagnosis not present

## 2021-09-21 DIAGNOSIS — C3432 Malignant neoplasm of lower lobe, left bronchus or lung: Secondary | ICD-10-CM | POA: Diagnosis not present

## 2021-09-21 DIAGNOSIS — J90 Pleural effusion, not elsewhere classified: Secondary | ICD-10-CM | POA: Diagnosis not present

## 2021-09-21 DIAGNOSIS — I480 Paroxysmal atrial fibrillation: Secondary | ICD-10-CM | POA: Diagnosis not present

## 2021-09-21 DIAGNOSIS — R471 Dysarthria and anarthria: Secondary | ICD-10-CM | POA: Diagnosis not present

## 2021-09-21 DIAGNOSIS — R7881 Bacteremia: Secondary | ICD-10-CM | POA: Diagnosis not present

## 2021-09-21 LAB — CBC WITH DIFFERENTIAL/PLATELET
Abs Immature Granulocytes: 0.14 10*3/uL — ABNORMAL HIGH (ref 0.00–0.07)
Basophils Absolute: 0 10*3/uL (ref 0.0–0.1)
Basophils Relative: 0 %
Eosinophils Absolute: 0 10*3/uL (ref 0.0–0.5)
Eosinophils Relative: 0 %
HCT: 24.5 % — ABNORMAL LOW (ref 36.0–46.0)
Hemoglobin: 8.2 g/dL — ABNORMAL LOW (ref 12.0–15.0)
Immature Granulocytes: 1 %
Lymphocytes Relative: 3 %
Lymphs Abs: 0.3 10*3/uL — ABNORMAL LOW (ref 0.7–4.0)
MCH: 35.5 pg — ABNORMAL HIGH (ref 26.0–34.0)
MCHC: 33.5 g/dL (ref 30.0–36.0)
MCV: 106.1 fL — ABNORMAL HIGH (ref 80.0–100.0)
Monocytes Absolute: 0.4 10*3/uL (ref 0.1–1.0)
Monocytes Relative: 4 %
Neutro Abs: 10.1 10*3/uL — ABNORMAL HIGH (ref 1.7–7.7)
Neutrophils Relative %: 92 %
Platelets: 72 10*3/uL — ABNORMAL LOW (ref 150–400)
RBC: 2.31 MIL/uL — ABNORMAL LOW (ref 3.87–5.11)
RDW: 15.5 % (ref 11.5–15.5)
WBC: 11 10*3/uL — ABNORMAL HIGH (ref 4.0–10.5)
nRBC: 0 % (ref 0.0–0.2)

## 2021-09-21 MED ORDER — GERHARDT'S BUTT CREAM: TOPICAL_CREAM | CUTANEOUS | Status: DC | PRN

## 2021-09-21 MED ORDER — NALOXEGOL OXALATE 12.5 MG PO TABS
12.5000 mg | ORAL_TABLET | Freq: Every day | ORAL | Status: DC
  Administered 2021-09-22 – 2021-09-23 (×2): 12.5 mg via ORAL
  Filled 2021-09-21 (×2): qty 1

## 2021-09-21 MED ORDER — FUROSEMIDE 10 MG/ML IJ SOLN
40.0000 mg | Freq: Once | INTRAMUSCULAR | Status: AC
  Administered 2021-09-21: 40 mg via INTRAVENOUS
  Filled 2021-09-21: qty 4

## 2021-09-21 NOTE — Progress Notes (Signed)
Palliative Medicine Progress Note   Patient Name: Patricia [name]       Date: 09/21/2021 DOB: July 06, 1950  Age: 71 y.o. MRN#: [id number] Attending Physician: Mariel Aloe, MD Primary Care Physician: [name], MD Admit Date: 09/09/2021  Reason for Consultation/Follow-up: {Reason for Consult:23484}  HPI/Patient Profile: 71 y.o. female  with past medical history of small cell lung cancer diagnosed in 2022 s/p chemo and radiation, anxiety, atrial fibrillation, and hypertension.  She is experienced increased shortness of breath, fatigue, and several falls over the last 4 to 6 weeks resulting in rib fractures and vertebral fracture that was scheduled for kyphoplasty this week.  CT chest showed left pleural effusion and enlarged subcarinal lymph node.  She saw her oncologist Dr. Julien Nordmann on 8/29 and was sent for thoracentesis on 9/1.  750 mL fluid withdrawn prior to procedure was aborted due to coughing.  The next day on 9/2 she presented with worsened mental status and hypoxia to 85% on baseline home O2 3 L.  Patient admitted to [facility] for recurrent left pleural effusion.  Palliative Medicine was consulted for goals of care.  Subjective: Chart reviewed.  I met with Patricia [name] and her husband [name] at bedside.  Discussed that the decision remains full scope treatment with chest tube placement or comfort care.  We discussed what care would look like at [facility].  Reviewed that the goal is for comfort and dignity rather than prolonging life.  Patricia [name] states that she is at peace and ready to "go home and be with the Lord".    We discussed how difficult this decision can be for a patient but also further family.  [name] understands that this is completely her decision, but she also wants her family to have some  degree of acceptance and not be upset with her.  Objective:  Physical Exam Constitutional:      General: She is not in acute distress.    Appearance: She is ill-appearing.  Pulmonary:     Effort: Pulmonary effort is normal.  Neurological:     Mental Status: She is alert and oriented to person, place, and time.     Motor: Weakness present.             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 (HCC)   Chronic respiratory failure with hypoxia (HCC)   Fracture of seventh thoracic vertebra (HCC)   DNR (do not resuscitate)/DNI(Do Not Intubate)   Dysarthria   Bacteremia   Loculated pleural effusion   SBO (small bowel obstruction) (HCC)    Recommendations/Plan: DNR/DNI as previously documented Continue current interventions Patient and family struggling to make final decision regarding comfort care and transfer to hospice facility Meeting with patient and family tomorrow at 11 AM   Symptom management: Dilaudid 0.5 mg IV every 3 hours as needed for pain or dyspnea Reduce Movantik to 12.5 mg daily   Prognosis: Difficult to determine, but likely weeks if transitioned to full comfort care with more aggressive symptom management  Discharge Planning: To Be Determined   Thank you for allowing the Palliative Medicine Team to assist in the care of this patient.   ***    B , NP   Please  contact Palliative Medicine Team phone at [phone] for questions and concerns.  For individual providers, please see AMION.      

## 2021-09-21 NOTE — Progress Notes (Signed)
PROGRESS NOTE    Patricia [name]  RSW:[id number] DOB: 1950-12-17 DOA: 09/09/2021 PCP: [name], MD   Brief Narrative: Patricia [name] is a 71 y.o. female with a history of small cell lung cancer, atrial fibrillation, T7 vertebbral fracture, pleural effusion. Patient presented secondary to shortness of breath and altered mental status secondary to hypoxia from recurrent effusion. Patient found to have evidence of parapneumonic effusion in addition to bacteremia. Currently on Unasyn for treatment.   Assessment and Plan:  Left-sided parapneumonic effusion Pulmonology consulted. Patient underwent thoracentesis on 9/1 and repeat thoracentesis on 9/5. No malignant cells or bacteria noted on fluid analysis, however PMN count of 2,650. Recommendation for chest tube placement, however patient has deferred this for now secondary to quality of life and goals of care concerns. Palliative care is consulted. Patient is considering cessation of active treatment and focus on comfort measures with possible plan to transfer to hospice facility.  Small cell lung cancer Patient follows with Dr. Julien Nordmann as an outpatient. History of chemotherapy. Patient also received prophylactic whole brain radiation. 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   Pasturella multocida bacteremia Unknown source; possibly lung although pleural fluid cultures were negative. -Continue Unasyn IV  Dysarthria CT head negative for acute process. MRI without acute process. Dysarthria appears improved.  Lower extremity edema Associated leg discomfort. -Lasix 40 mg IV x1 and reassess  Acute respiratory failure with hypoxia Secondary to parapneumonic effusion. Patient on 4 L/min of supplemental oxygen -Wean to room air as able  T7 vertebral fracture Patient with prior plan for kyphoplasty which is deferred secondary to active infection.  Atrial fibrillation Eliquis continued  secondary to patient's decline for chest tube insertion. Patient is also on Toprol XL and amiodarone -Continue Toprol XL, amiodarone and Eliquis  Primary hypertension -Continue Toprol XL  Constipation Ileus Patient managed with bowel regimen, but required management with an NG tube a/ intermittent suction. Ileus appears resolved.  Hypomagnesemia -Continue supplementation  Hyponatremia Mild. Started on sodium tablets with improvement. Asymptomatic. 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 -Discontinue sodium tablets  Delirium Appears resolved.  Chronic diastolic heart failure Stable and compensated.   DVT prophylaxis: Eliquis Code Status:   Code Status: DNR Family Communication: Husband at bedside Disposition Plan: Discharge pending continued goals of care discussion vs continued active management of effusion/bacteremia   Consultants:  PCCM Interventional radiology Palliative care medicine  Procedures:  Thoracentesis  Antimicrobials: Ceftriaxone Cefepime Azithromycin Unasyn    Subjective: Leg swelling with some pain. Also with some intermittent dyspnea.  Objective: BP 111/65 (BP Location: Left Arm)   Pulse 76   Temp 98.7 F (37.1 C) (Oral)   Resp 19   Ht '5\' 7"'  (1.702 m)   Wt 67.1 kg   LMP  (LMP Unknown)   SpO2 95%   BMI 23.17 kg/m   Examination:  General exam: Appears calm and comfortable Respiratory system: Rales and diminished breath sounds on left. Clear breath sounds on right. Respiratory effort normal. Cardiovascular system: S1 & S2 heard, RRR. Gastrointestinal system: Abdomen is nondistended, soft and nontender. Normal bowel sounds heard. Central nervous system: Alert and oriented. No focal neurological deficits. Musculoskeletal: BLE 1+ pitting edema. No calf tenderness Skin: No cyanosis. No rashes   Data Reviewed: I have personally reviewed following labs and imaging studies  CBC Lab Results  Component Value Date    WBC 11.0 (H) 09/21/2021   RBC 2.31 (L) 09/21/2021   HGB 8.2 (L)  09/21/2021   HCT 24.5 (L) 09/21/2021   MCV 106.1 (H) 09/21/2021   MCH 35.5 (H) 09/21/2021   PLT 72 (L) 09/21/2021   MCHC 33.5 09/21/2021   RDW 15.5 09/21/2021   LYMPHSABS 0.3 (L) 09/21/2021   MONOABS 0.4 09/21/2021   EOSABS 0.0 09/21/2021   BASOSABS 0.0 41/29/0475     Last metabolic panel Lab Results  Component Value Date   NA 136 09/20/2021   K 3.5 09/20/2021   CL 100 09/20/2021   CO2 27 09/20/2021   BUN 15 09/20/2021   CREATININE 0.55 09/20/2021   GLUCOSE 97 09/20/2021   GFRNONAA >60 09/20/2021   GFRAA  02/22/2009    >60        The eGFR has been calculated using the MDRD equation. This calculation has not been validated in all clinical situations. eGFR's persistently <60 mL/min signify possible Chronic Kidney Disease.   CALCIUM 8.0 (L) 09/20/2021   PHOS 2.8 09/16/2021   PROT 5.2 (L) 09/15/2021   ALBUMIN 1.9 (L) 09/15/2021   BILITOT 0.6 09/15/2021   ALKPHOS 87 09/15/2021   AST 46 (H) 09/15/2021   ALT 33 09/15/2021   ANIONGAP 9 09/20/2021    GFR: Estimated Creatinine Clearance: 61.8 mL/min (by C-G formula based on SCr of 0.55 mg/dL).  Recent Results (from the past 240 hour(s))  Body fluid culture w Gram Stain     Status: None   Collection Time: 09/12/21  3:30 PM   Specimen: Pleural Fluid  Result Value Ref Range Status   Specimen Description PLEURAL  Final   Special Requests NONE  Final   Gram Stain NO WBC SEEN NO ORGANISMS SEEN   Final   Culture   Final    NO GROWTH 3 DAYS Performed at [facility], [address] [place], [place] [address]    Report Status 09/16/2021 FINAL  Final      Radiology Studies: No results found.    LOS: 12 days    Cordelia Poche, MD [facility] 09/21/2021, 3:29 PM   If 7PM-7AM, please contact night-coverage [email or web address]

## 2021-09-22 ENCOUNTER — Inpatient Hospital Stay (HOSPITAL_COMMUNITY): Payer: Medicare HMO

## 2021-09-22 DIAGNOSIS — J90 Pleural effusion, not elsewhere classified: Secondary | ICD-10-CM | POA: Diagnosis not present

## 2021-09-22 DIAGNOSIS — I480 Paroxysmal atrial fibrillation: Secondary | ICD-10-CM | POA: Diagnosis not present

## 2021-09-22 DIAGNOSIS — S22060A Wedge compression fracture of T7-T8 vertebra, initial encounter for closed fracture: Secondary | ICD-10-CM | POA: Diagnosis not present

## 2021-09-22 DIAGNOSIS — C3432 Malignant neoplasm of lower lobe, left bronchus or lung: Secondary | ICD-10-CM | POA: Diagnosis not present

## 2021-09-22 DIAGNOSIS — R7881 Bacteremia: Secondary | ICD-10-CM | POA: Diagnosis not present

## 2021-09-22 DIAGNOSIS — R471 Dysarthria and anarthria: Secondary | ICD-10-CM | POA: Diagnosis not present

## 2021-09-22 DIAGNOSIS — J9611 Chronic respiratory failure with hypoxia: Secondary | ICD-10-CM | POA: Diagnosis not present

## 2021-09-22 LAB — CBC WITH DIFFERENTIAL/PLATELET
Abs Immature Granulocytes: 0.14 10*3/uL — ABNORMAL HIGH (ref 0.00–0.07)
Basophils Absolute: 0 10*3/uL (ref 0.0–0.1)
Basophils Relative: 0 %
Eosinophils Absolute: 0 10*3/uL (ref 0.0–0.5)
Eosinophils Relative: 0 %
HCT: 23.6 % — ABNORMAL LOW (ref 36.0–46.0)
Hemoglobin: 8.1 g/dL — ABNORMAL LOW (ref 12.0–15.0)
Immature Granulocytes: 1 %
Lymphocytes Relative: 2 %
Lymphs Abs: 0.2 10*3/uL — ABNORMAL LOW (ref 0.7–4.0)
MCH: 36.2 pg — ABNORMAL HIGH (ref 26.0–34.0)
MCHC: 34.3 g/dL (ref 30.0–36.0)
MCV: 105.4 fL — ABNORMAL HIGH (ref 80.0–100.0)
Monocytes Absolute: 0.5 10*3/uL (ref 0.1–1.0)
Monocytes Relative: 5 %
Neutro Abs: 9.9 10*3/uL — ABNORMAL HIGH (ref 1.7–7.7)
Neutrophils Relative %: 92 %
Platelets: 80 10*3/uL — ABNORMAL LOW (ref 150–400)
RBC: 2.24 MIL/uL — ABNORMAL LOW (ref 3.87–5.11)
RDW: 15.5 % (ref 11.5–15.5)
WBC: 10.8 10*3/uL — ABNORMAL HIGH (ref 4.0–10.5)
nRBC: 0 % (ref 0.0–0.2)

## 2021-09-22 MED ORDER — LORAZEPAM 1 MG PO TABS
1.0000 mg | ORAL_TABLET | ORAL | Status: DC | PRN
  Administered 2021-09-22: 1 mg via ORAL
  Filled 2021-09-22: qty 1

## 2021-09-22 MED ORDER — HYDROMORPHONE HCL 1 MG/ML IJ SOLN
1.0000 mg | INTRAMUSCULAR | Status: DC | PRN
  Administered 2021-09-22 – 2021-09-23 (×7): 1 mg via INTRAVENOUS
  Filled 2021-09-22 (×7): qty 1

## 2021-09-22 MED ORDER — BIOTENE DRY MOUTH MT LIQD: 15.0000 mL | OROMUCOSAL | Status: DC | PRN

## 2021-09-22 MED ORDER — GLYCOPYRROLATE 0.2 MG/ML IJ SOLN: 0.2000 mg | INTRAMUSCULAR | Status: DC | PRN

## 2021-09-22 MED ORDER — LORAZEPAM 2 MG/ML IJ SOLN: 1.0000 mg | INTRAMUSCULAR | Status: DC | PRN

## 2021-09-22 MED ORDER — FUROSEMIDE 10 MG/ML IJ SOLN
40.0000 mg | Freq: Two times a day (BID) | INTRAMUSCULAR | Status: DC
  Administered 2021-09-22 – 2021-09-23 (×2): 40 mg via INTRAVENOUS
  Filled 2021-09-22 (×2): qty 4

## 2021-09-22 MED ORDER — GLYCOPYRROLATE 1 MG PO TABS: 1.0000 mg | ORAL_TABLET | ORAL | Status: DC | PRN

## 2021-09-22 MED ORDER — POLYVINYL ALCOHOL 1.4 % OP SOLN: 1.0000 [drp] | Freq: Four times a day (QID) | OPHTHALMIC | Status: DC | PRN

## 2021-09-22 MED ORDER — FUROSEMIDE 10 MG/ML IJ SOLN
40.0000 mg | Freq: Once | INTRAMUSCULAR | Status: AC
  Administered 2021-09-22: 40 mg via INTRAVENOUS
  Filled 2021-09-22: qty 4

## 2021-09-22 NOTE — Progress Notes (Signed)
PROGRESS NOTE    Patricia [name]  KTG:[id number] DOB: [birth year] DOA: 09/09/2021 PCP: Patricia Frees, MD   Brief Narrative: Patricia [name] is a 71 y.o. female with a history of small cell lung cancer, atrial fibrillation, T7 vertebbral fracture, pleural effusion. Patient presented secondary to shortness of breath and altered mental status secondary to hypoxia from recurrent effusion. Patient found to have evidence of parapneumonic effusion in addition to bacteremia. Currently on Unasyn for treatment.   Assessment and Plan:  Left-sided parapneumonic effusion Pulmonology consulted. Patient underwent thoracentesis on 9/1 and repeat thoracentesis on 9/5. No malignant cells or bacteria noted on fluid analysis, however PMN count of 2,650. Recommendation for chest tube placement, however patient has deferred this for now secondary to quality of life and goals of care concerns. Palliative care is consulted. Patient is considering cessation of active treatment and focus on comfort measures with possible plan to transfer to hospice facility.  Small cell lung cancer Patient follows with Patricia [name] as an outpatient. History of chemotherapy. Patient also received prophylactic whole brain radiation. 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   Pasturella multocida bacteremia Unknown source; possibly lung although pleural fluid cultures were negative. -Continue Unasyn IV  Dysarthria CT head negative for acute process. MRI without acute process. Dysarthria appears improved.  Lower extremity edema Associated leg discomfort. Improved with Lasix.  Acute respiratory failure with hypoxia Secondary to parapneumonic effusion. Patient requiring increased oxygen up to 6 L/min with worsening dyspnea. -Continue oxygen -Dilaudid 1 mg PRN -Lasix IV -Pending goals of care discussion, will consider repeat thoracentesis  T7 vertebral fracture Patient with prior plan  for kyphoplasty which is deferred secondary to active infection.  Atrial fibrillation Eliquis continued secondary to patient's decline for chest tube insertion. Patient is also on Toprol XL and amiodarone -Continue Toprol XL, amiodarone and Eliquis  Primary hypertension -Continue Toprol XL  Constipation Ileus Patient managed with bowel regimen, but required management with an NG tube a/ intermittent suction. Ileus appears resolved.  Hypomagnesemia -Continue supplementation  Hyponatremia Mild. Started on sodium tablets with improvement. Asymptomatic. 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 Discontinued sodium tablets.  Delirium Appears resolved.  Chronic diastolic heart failure Stable and compensated.   DVT prophylaxis: Eliquis Code Status:   Code Status: DNR Family Communication: Two daughters at bedside Disposition Plan: Discharge pending continued goals of care discussion vs continued active management of effusion/bacteremia   Consultants:  PCCM Interventional radiology Palliative care medicine  Procedures:  Thoracentesis  Antimicrobials: Ceftriaxone Cefepime Azithromycin Unasyn    Subjective: Patient reports worsening dyspnea today.  Objective: BP 93/79   Pulse 71   Temp 97.7 F (36.5 C) (Oral)   Resp 19   Ht _0  (1.702 m)   Wt 73.7 kg   LMP  (LMP Unknown)   SpO2 100%   BMI 25.45 kg/m   Examination:  General exam: Appears calm and comfortable Respiratory system: Significant rales on left. Pursed lip breathing. Increased work of breathing. Cardiovascular system: S1 & S2 heard, RRR. No murmurs, rubs, gallops or clicks. Gastrointestinal system: Abdomen is nondistended, soft and nontender. Normal bowel sounds heard. Central nervous system: Alert and oriented. No focal neurological deficits. Musculoskeletal: No calf tenderness Skin: No cyanosis. No rashes Psychiatry: Judgement and insight appear normal. Mood & affect  appropriate.    Data Reviewed: I have personally reviewed following labs and imaging studies  CBC Lab Results  Component Value Date   WBC 10.8 (H)  09/22/2021   RBC 2.24 (L) 09/22/2021   HGB 8.1 (L) 09/22/2021   HCT 23.6 (L) 09/22/2021   MCV 105.4 (H) 09/22/2021   MCH 36.2 (H) 09/22/2021   PLT 80 (L) 09/22/2021   MCHC 34.3 09/22/2021   RDW 15.5 09/22/2021   LYMPHSABS 0.2 (L) 09/22/2021   MONOABS 0.5 09/22/2021   EOSABS 0.0 09/22/2021   BASOSABS 0.0 85/63/1497     Last metabolic panel Lab Results  Component Value Date   NA 136 09/20/2021   K 3.5 09/20/2021   CL 100 09/20/2021   CO2 27 09/20/2021   BUN 15 09/20/2021   CREATININE 0.55 09/20/2021   GLUCOSE 97 09/20/2021   GFRNONAA >60 09/20/2021   GFRAA  02/22/2009    >60        The eGFR has been calculated using the MDRD equation. This calculation has not been validated in all clinical situations. eGFR's persistently <60 mL/min signify possible Chronic Kidney Disease.   CALCIUM 8.0 (L) 09/20/2021   PHOS 2.8 09/16/2021   PROT 5.2 (L) 09/15/2021   ALBUMIN 1.9 (L) 09/15/2021   BILITOT 0.6 09/15/2021   ALKPHOS 87 09/15/2021   AST 46 (H) 09/15/2021   ALT 33 09/15/2021   ANIONGAP 9 09/20/2021    GFR: Estimated Creatinine Clearance: 61.8 mL/min (by C-G formula based on SCr of 0.55 mg/dL).  Recent Results (from the past 240 hour(s))  Body fluid culture w Gram Stain     Status: None   Collection Time: 09/12/21  3:30 PM   Specimen: Pleural Fluid  Result Value Ref Range Status   Specimen Description PLEURAL  Final   Special Requests NONE  Final   Gram Stain NO WBC SEEN NO ORGANISMS SEEN   Final   Culture   Final    NO GROWTH 3 DAYS Performed at [facility], [address] [place], [place] [address]    Report Status 09/16/2021 FINAL  Final      Radiology Studies: No results found.    LOS: 13 days    Patricia Poche, MD [facility] 09/22/2021, 12:00 PM   If 7PM-7AM, please contact  night-coverage [email or web address]

## 2021-09-22 NOTE — TOC Progression Note (Signed)
Transition of Care [facility]) - Progression Note    Patient Details  Name: Dale Strausser MRN: [id number] Date of Birth: 1950/05/04  Transition of Care [facility]) CM/SW [place], [place] Phone Number: 09/22/2021, 12:07 PM  Clinical Narrative:     CSW met with pt and pt family bedside. Confirmed choice for [facility]. CSW made referral for [facility]. Pt's spouse [name] is best contact: [phone] [facility]).   Expected Discharge Plan: [place] Barriers to Discharge: Continued Medical Work up  Expected Discharge Plan and Services Expected Discharge Plan: [place]   Discharge Planning Services: CM Consult                       DME Agency: NA                   Social Determinants of Health (SDOH) Interventions    Readmission Risk Interventions    09/20/2021   12:36 PM 02/13/2021    1:15 PM 10/17/2020    1:03 PM  Readmission Risk Prevention Plan  Post Dischage Appt   Complete  Medication Screening   Complete  Transportation Screening  Complete Complete  Medication Review Press photographer) Complete Complete   PCP or Specialist appointment within 3-5 days of discharge Complete Complete   HRI or Home Care Consult Complete Complete   SW Recovery Care/Counseling Consult Complete Complete   Palliative Care Screening Complete Not [place] Not Applicable Not Applicable

## 2021-09-22 NOTE — Progress Notes (Signed)
Manufacturing engineer [facility]) Hospital Liaison Note  Referral received for patient/family interest in [facility]. Chart under review by [facility] physician.   Hospice eligibility pending.   Please call with any questions or concerns. Thank you

## 2021-09-22 NOTE — Progress Notes (Signed)
Palliative Medicine Progress Note   Patient Name: Patricia [name]       Date: 09/22/2021 DOB: 07-06-1950  Age: 71 y.o. MRN#: [id number] Attending Physician: Mariel Aloe, MD Primary Care Physician: [name], MD Admit Date: 09/09/2021    HPI/Patient Profile: 71 y.o. female  with past medical history of small cell lung cancer diagnosed in 2022 s/p chemo and radiation, anxiety, atrial fibrillation, and hypertension.  She is experienced increased shortness of breath, fatigue, and several falls over the last 4 to 6 weeks resulting in rib fractures and vertebral fracture that was scheduled for kyphoplasty this week.  CT chest showed left pleural effusion and enlarged subcarinal lymph node.  She saw her oncologist Dr. Julien Nordmann on 8/29 and was sent for thoracentesis on 9/1.  750 mL fluid withdrawn prior to procedure was aborted due to coughing.  The next day on 9/2 she presented with worsened mental status and hypoxia to 85% on baseline home O2 3 L.  Patient admitted to [facility] for recurrent left pleural effusion.  Palliative Medicine was consulted for goals of care.  Subjective: I met today at bedside with Linward Headland, and their daughters [name] and [place]. Skyelyn reports feeling worse today, mainly due to worsening shortness of breath. I recommended increasing prn dilaudid to 1 mg, and she is agreeable.  Ashlinn expresses her wish to proceed with comfort care and transfer to [facility], and her family is supportive of this decision.   Discussed transitioning to comfort care in the hospital, and what that would look like--keeping her clean and dry, no labs, no IV fluids, no antibiotics, no cardiac monitoring, minimizing of medications, and continuation of medication for pain and dyspnea. Discussed  natural trajectory at end of life. Emotional support provided.  Mckenzy is looking forward to a visit from their yellow lab [place] while she is at [facility]. Therapeutic listening, reflection, and story-telling was utilized throughout our visit.   I returned to bedside later this afternoon to reassess patient's comfort level and symptoms. Arleene reports the increased dose of dilaudid has been effective at relieving her pain and dyspnea.   Plan of care discussed with Dr. Lonny Prude, patient's RN, and TOC.    Objective:  Physical Exam Vitals reviewed.  Constitutional:      General: She is not in acute distress.  Appearance: She is ill-appearing.  Pulmonary:     Effort: Pulmonary effort is normal.  Neurological:     Mental Status: She is alert and oriented to person, place, and time.     Motor: Weakness present.             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)   Chronic respiratory failure with hypoxia (HCC)   Fracture of seventh thoracic vertebra (HCC)   DNR (do not resuscitate)/DNI(Do Not Intubate)   Dysarthria   Bacteremia   Loculated pleural effusion   SBO (small bowel obstruction) (HCC)    Recommendations/Plan: Full comfort measures initiated DNR/DNI as previously documented Referral to [place] consult placed; TOC and hospice liaison notified D/C labs, IV fluids, antibiotics, cardiac monitoring,  and minimize medications Added orders for symptom management at EOL  PMT will continue to follow   Symptom Management:  Dilaudid IV or Vicodin prn for pain or dyspnea Lorazepam (ATIVAN) prn for anxiety Haloperidol (HALDOL) prn for agitation  Glycopyrrolate (ROBINUL) for excessive secretions Ondansetron (ZOFRAN) prn for nausea Polyvinyl alcohol (LIQUIFILM TEARS) prn for dry eyes Antiseptic oral rinse (BIOTENE) prn for dry mouth  Prognosis:  < 2 weeks  Discharge Planning: Hospice facility   Thank you for allowing the Palliative Medicine Team to assist in the care of this patient.   MDM - High   Lavena Bullion, NP   Please contact Palliative Medicine Team phone at [phone] for questions and concerns.  For individual providers, please see AMION.

## 2021-09-23 DIAGNOSIS — J9611 Chronic respiratory failure with hypoxia: Secondary | ICD-10-CM | POA: Diagnosis not present

## 2021-09-23 DIAGNOSIS — I480 Paroxysmal atrial fibrillation: Secondary | ICD-10-CM | POA: Diagnosis not present

## 2021-09-23 DIAGNOSIS — J90 Pleural effusion, not elsewhere classified: Secondary | ICD-10-CM | POA: Diagnosis not present

## 2021-09-23 DIAGNOSIS — I10 Essential (primary) hypertension: Secondary | ICD-10-CM | POA: Diagnosis not present

## 2021-09-23 MED ORDER — MENTHOL 3 MG MT LOZG: 1.0000 | LOZENGE | OROMUCOSAL | Status: DC | PRN

## 2021-09-23 NOTE — TOC Transition Note (Signed)
Transition of Care [facility]) - CM/SW Discharge Note   Patient Details  Name: Patricia [name] MRN: [id number] Date of Birth: 1950-06-29  [facility] CM/SW Contact:  Bary Castilla, LCSW Phone Number: 09/23/2021, 12:10 PM   Clinical Narrative:     Patient will DC to:?[facility] Anticipated DC date:?09/23/2021 Family notified:?Spouse Transport by: Corey Harold   Per MD patient ready for DC to [facility]. RN, patient, patient's family, and facility notified of DC. Discharge Summary sent to facility. RN given number for report  [phone] . DC packet on chart. Ambulance transport requested for patient.  CSW signing off.   [place], [place] [phone]   Final next level of care: [place] Barriers to Discharge: Barriers Resolved   Patient Goals and CMS Choice Patient states their goals for this hospitalization and ongoing recovery are:: return home   Choice offered to / list presented to : NA  Discharge Placement              Patient chooses bed at: Other - please specify in the comment section below: ([facility]) Patient to be transferred to facility by: [place] Name of family member notified: Spouse Patient and family notified of of transfer: 09/23/21  Discharge Plan and Services   Discharge Planning Services: CM Consult              DME Agency: NA                  Social Determinants of Health ([place]) Interventions     Readmission Risk Interventions    09/20/2021   12:36 PM 02/13/2021    1:15 PM 10/17/2020    1:03 PM  Readmission Risk Prevention Plan  Post Dischage Appt   Complete  Medication Screening   Complete  Transportation Screening  Complete Complete  Medication Review Press photographer) Complete Complete   PCP or Specialist appointment within 3-5 days of discharge Complete Complete   HRI or [place] Complete Complete   SW Recovery Care/Counseling Consult Complete Complete   Palliative Care Screening Complete  Not [place] Not Applicable Not Applicable

## 2021-09-23 NOTE — Progress Notes (Signed)
Transported by [facility].

## 2021-09-23 NOTE — Progress Notes (Addendum)
Gave report to [place]. Discharge with foley catheter. Family request to leave PIV in,nurse aware. Awaiting transport.

## 2021-09-23 NOTE — Discharge Summary (Signed)
Physician Discharge Summary   Patient: Patricia [name] MRN: [id number] DOB: [birth year]  Admit date:     09/09/2021  Discharge date: 09/23/21  Discharge Physician: Cordelia Poche, MD   PCP: [name], MD   Recommendations at discharge:  None. Comfort measures.  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 *  Hospital Course: Patricia [name] is a 71 y.o. female with a history of small cell lung cancer, atrial fibrillation, T7 vertebbral fracture, pleural effusion. Patient presented secondary to shortness of breath and altered mental status secondary to hypoxia from recurrent effusion. Patient found to have evidence of parapneumonic effusion in addition to bacteremia. Patient managed on Unasyn for treatment. Chest tube offered, but patient declined. Palliative care consulted for continued goals of care discussions, which took a few days before the decision was made to pursue hospice on discharge.  Assessment and Plan:  Left-sided parapneumonic effusion Pulmonology consulted. Patient underwent thoracentesis on 9/1 and repeat thoracentesis on 9/5. No malignant cells or bacteria noted on fluid analysis, however PMN count of 2,650. Recommendation for chest tube placement, however patient has deferred this for now secondary to quality of life and goals of care concerns. Palliative care is consulted. Patient decided on transitioning to full comfort measures plan to transfer to hospice facility on discharge.   Small cell  lung cancer Patient follows with Dr. Julien Nordmann as an outpatient. History of chemotherapy. Patient also received prophylactic whole brain radiation. 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    Pasturella multocida bacteremia Unknown source; possibly lung although pleural fluid cultures were negative.Managed on Unasyn IV. Now patient is comfort measures.   Dysarthria CT head negative for acute process. MRI without acute process. Dysarthria appears improved.   Lower extremity edema Associated leg discomfort. Improved with Lasix.   Acute respiratory failure with hypoxia Secondary to parapneumonic effusion. Patient requiring increased oxygen up to 6 L/min with worsening dyspnea. Now comfort measures.   T7 vertebral fracture Patient with prior plan for kyphoplasty which is deferred secondary to active infection.   Atrial fibrillation Eliquis continued secondary to patient's decline for chest tube insertion. Patient is also on Toprol XL and amiodarone. Now comfort measures. Continue Toprol XL.   Primary hypertension Continue Toprol XL.   Constipation Ileus Patient managed with bowel regimen, but required management with an NG tube a/ intermittent suction. Ileus appears resolved.   Hypomagnesemia Supplementation given.   Hyponatremia Mild. Started on sodium tablets with improvement. Asymptomatic. 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 Discontinued sodium tablets.   Delirium Appears resolved.   Chronic diastolic heart failure Stable and compensated.   Consultants: PCCM, Interventional radiology, Palliative care medicine Procedures performed: Thoracentesis  Disposition: Hospice care Diet recommendation: Regular  DISCHARGE MEDICATION: Allergies as of 09/23/2021       Reactions   Amoxicillin-pot Clavulanate Nausea Only, Other (See Comments)   GI upset   Doxycycline Swelling   Oral swelling   Levaquin [levofloxacin In D5w]  Other (See Comments)   Joint problems  Magnesium Hydroxide Other (See Comments)   Welts   Oxycodone Nausea Only   Other Itching   Paper tape        Medication List     STOP taking these medications    acetaminophen 325 MG tablet Commonly known as: TYLENOL   albuterol 108 (90 Base) MCG/ACT inhaler Commonly known as: VENTOLIN HFA   amiodarone 200 MG tablet Commonly known as: PACERONE   B-12 PO   carboxymethylcellulose 0.5 % Soln Commonly known as: REFRESH PLUS   carisoprodol 350 MG tablet Commonly known as: SOMA   clobetasol ointment 0.05 % Commonly known as: TEMOVATE   clonazePAM 1 MG tablet Commonly known as: KLONOPIN   Coenzyme Q10 200 MG capsule   docusate sodium 100 MG capsule Commonly known as: COLACE   Eliquis 5 MG Tabs tablet Generic drug: apixaban   escitalopram 10 MG tablet Commonly known as: LEXAPRO   escitalopram 20 MG tablet Commonly known as: LEXAPRO   feeding supplement Liqd   fluticasone 50 MCG/ACT nasal spray Commonly known as: FLONASE   furosemide 20 MG tablet Commonly known as: LASIX   gabapentin 600 MG tablet Commonly known as: NEURONTIN   guaiFENesin 600 MG 12 hr tablet Commonly known as: MUCINEX   guaiFENesin-dextromethorphan 100-10 MG/5ML syrup Commonly known as: ROBITUSSIN DM   HYDROcodone-acetaminophen 10-325 MG tablet Commonly known as: NORCO   hydrOXYzine 25 MG tablet Commonly known as: ATARAX   ipratropium 0.02 % nebulizer solution Commonly known as: ATROVENT   levalbuterol 1.25 MG/3ML nebulizer solution Commonly known as: XOPENEX   lidocaine 2 % solution Commonly known as: XYLOCAINE   lidocaine 5 % Commonly known as: LIDODERM   linaclotide 145 MCG Caps capsule Commonly known as: LINZESS   magic mouthwash Soln   magnesium oxide 400 (240 Mg) MG tablet Commonly known as: MAG-OX   memantine 10 MG tablet Commonly known as: Namenda   multivitamin with minerals Tabs tablet   nystatin 100000  UNIT/ML suspension Commonly known as: MYCOSTATIN   omeprazole 20 MG capsule Commonly known as: PRILOSEC   polyethylene glycol 17 g packet Commonly known as: MIRALAX / GLYCOLAX   prochlorperazine 10 MG tablet Commonly known as: COMPAZINE   promethazine 25 MG tablet Commonly known as: PHENERGAN   PROTEIN PO   protein supplement 6 g Powd Commonly known as: RESOURCE BENEPROTEIN   REMIFEMIN PO   rosuvastatin 5 MG tablet Commonly known as: CRESTOR   sodium chloride 1 g tablet   Systane 0.4-0.3 % Gel ophthalmic gel Generic drug: Polyethyl Glycol-Propyl Glycol   traMADol 50 MG tablet Commonly known as: ULTRAM   Vitamin D3 25 MCG (1000 UT) Chew       TAKE these medications    metoprolol succinate 25 MG 24 hr tablet Commonly known as: TOPROL-XL Take 12.5 mg by mouth daily.        Follow-up Information     [name], MD Follow up.   Specialty: Family Medicine Contact information: [address] [id number] [phone]                Discharge Exam: BP 108/63 (BP Location: Left Arm)   Pulse 73   Temp 97.7 F (36.5 C) (Oral)   Resp 18   Ht 5\' 7"  (1.702 m)   Wt 73.7 kg   LMP  (LMP Unknown)   SpO2 94%   BMI 25.45 kg/m    Condition at discharge:  Comfort measures  The results of significant diagnostics from  this hospitalization (including imaging, microbiology, ancillary and laboratory) are listed below for reference.   Imaging Studies: DG Abd 1 View  Result Date: 09/22/2021 CLINICAL DATA:  Stool burden. EXAM: ABDOMEN - 1 VIEW COMPARISON:  September 13, 2021. FINDINGS: No abnormal bowel dilatation is noted. No significant stool burden is noted. IMPRESSION: No significant stool burden seen. Electronically Signed   By: Marijo Conception M.D.   On: 09/22/2021 14:41   DG CHEST PORT 1 VIEW  Result Date: 09/18/2021 CLINICAL DATA:  Shortness of breath EXAM: PORTABLE CHEST 1 VIEW COMPARISON:  AP chest 09/12/2021, chest two  views 09/11/2021, CT chest 09/09/2021 FINDINGS: There is partial aeration of the left mid to upper lung. There is again homogeneous opacification of the inferior 50% of the left hemithorax. Less dense but fairly homogeneous opacification of the left mid to upper left lung with some areas of aeration consistent with the large posterior pleural effusion and overlying aerated left upper lobe and lingula portions of the anterior left lung on prior CT. Mild right interstitial thickening is similar to prior. Increased upper lung lucencies within the right lung again consistent with the bilateral emphysematous changes seen on prior CT. No pneumothorax is seen. The visualized cardiac silhouette again appears mildly enlarged. Mediastinal contours are grossly unremarkable with moderate calcification within the aortic arch. Moderate multilevel degenerative disc changes of the thoracic spine. Kyphoplasty cement within the approximate L2 vertebral body level where there is mild height loss. IMPRESSION: Redemonstration of homogeneous opacification of the inferior greater than superior aspect of the left lung consistent with the inferior greater than superior multiloculated left pleural effusion combined with lingular and left lower lobe airspace opacification and volume loss better seen on prior CT. Electronically Signed   By: Yvonne Kendall M.D.   On: 09/18/2021 14:30   DG Abd 1 View  Result Date: 09/13/2021 CLINICAL DATA:  Ileus EXAM: ABDOMEN - 1 VIEW COMPARISON:  09/11/2021 FINDINGS: Large stool burden throughout the colon. No evidence of bowel obstruction. Gas within mildly prominent large and small bowel, likely mild ileus, improved since prior study. No organomegaly or free air. IMPRESSION: Improving gaseous distention of bowel, likely improving ileus. Large stool burden throughout the colon. Electronically Signed   By: Rolm Baptise M.D.   On: 09/13/2021 16:11   DG CHEST PORT 1 VIEW  Result Date: 09/12/2021 CLINICAL  DATA:  Atelectasis of left lung EXAM: PORTABLE CHEST 1 VIEW COMPARISON:  Previous studies including the examination of 09/11/2021 FINDINGS: There is partial improvement in aeration in left upper and left mid lung fields. These continued opacification in the left lower lung field. There are faint radiopacities in the periphery of left upper lung field, possibly suggesting loculated effusions. There is blunting of right lateral costophrenic angle. Increased interstitial markings are seen in right lower lung field. There is no pneumothorax. IMPRESSION: There is interval partial improvement in aeration in left lung suggesting decrease in atelectasis. Still, there is complete opacification of left lower lung field and partial opacification of left upper lung fields suggesting residual atelectasis/pneumonia and pleural effusion. Increased interstitial markings in right lower lung fields suggest interstitial pneumonia. Small right pleural effusion. Electronically Signed   By: Elmer Picker M.D.   On: 09/12/2021 16:24   IR US CHEST  Result Date: 09/12/2021 CLINICAL DATA:  Left lung atelectasis with component of pleural fluid. History of small cell lung carcinoma. EXAM: CHEST ULTRASOUND COMPARISON:  CT of the chest on 09/09/2021 FINDINGS: By ultrasound, component of pleural fluid  is relatively small and extremely complex, multiloculated and septated. Thoracentesis was not performed today. The predominant component opacity by chest x-ray yesterday clearly represents atelectasis of essentially the entire left lung from bronchial obstruction. IMPRESSION: Component of pleural fluid is relatively small and very complex, multiloculated and multi-septated by ultrasound. Thoracentesis was not performed. Progressive opacification of the left hemithorax by chest x-ray clearly represents central left bronchial obstruction. Electronically Signed   By: Aletta Edouard M.D.   On: 09/12/2021 08:49   DG Abd Portable 1V  Result  Date: 09/11/2021 CLINICAL DATA:  NG placement. EXAM: PORTABLE ABDOMEN - 1 VIEW COMPARISON:  CT abdomen pelvis dated 08/03/2021 and abdominal radiograph dated 09/11/2021. FINDINGS: Partially visualized enteric tube with tip and side-port in the left upper abdomen in the stomach. Diffuse small bowel dilatation measure up to 6 cm. There is opacification of the visualized left lung. IMPRESSION: 1. Partially visualized enteric tube with tip and side-port in the stomach. 2. Diffuse small bowel dilatation. Electronically Signed   By: Anner Crete M.D.   On: 09/11/2021 20:04   DG Abd 1 View  Result Date: 09/11/2021 CLINICAL DATA:  Abdominal distension EXAM: ABDOMEN - 1 VIEW COMPARISON:  Previous studies including CT done on 08/03/2021 and abdomen radiograph done on 07/26/2021 FINDINGS: There is opacification of visualized left lower lung field. Small patchy infiltrate is seen right lower lung field. There is increase in the amount of gas in colon. Moderate amount of stool is seen in the colon without signs of fecal impaction in rectum. There is no definite demonstrable small-bowel dilation. Stomach is not distended. IMPRESSION: There is increased amount of gas which appears to be mostly in the colon suggesting possible ileus or distal colonic obstruction. There is no significant small bowel dilation. There is opacification of left lower lung fields suggesting large pleural effusion. Electronically Signed   By: Elmer Picker M.D.   On: 09/11/2021 16:30   DG Chest 2 View  Result Date: 09/11/2021 CLINICAL DATA:  Pleural effusion EXAM: CHEST - 2 VIEW COMPARISON:  Previous studies including the examination of 09/09/2021 FINDINGS: There is interval complete opacification of left hemithorax. This suggests atelectasis in left lung, possibly due to central obstructing mucous plug. Small patchy infiltrate is seen in right lower lung field. Right lateral CP angle is clear. There is no pneumothorax. IMPRESSION: There is  interval complete opacification of left hemithorax suggesting atelectasis in left lung, possibly due to central obstructing mucous plug. Part of this opacity is due to left pleural effusion. Small patchy infiltrate in right lower lung field may suggest atelectasis/pneumonia. There is no pneumothorax. These results will be called to the ordering clinician or representative by the Radiologist Assistant, and communication documented in the PACS or [employer]. Electronically Signed   By: Elmer Picker M.D.   On: 09/11/2021 16:26   MR BRAIN WO CONTRAST  Result Date: 09/10/2021 CLINICAL DATA:  71 year old female with altered mental status. History of prophylactic whole brain radiation in the setting of small cell lung cancer. EXAM: MRI HEAD WITHOUT CONTRAST TECHNIQUE: Multiplanar, multiecho pulse sequences of the brain and surrounding structures were obtained without intravenous contrast. COMPARISON:  Head CT yesterday.  Brain MRI 06/22/2021. FINDINGS: Brain: No restricted diffusion to suggest acute infarction. No midline shift, mass effect, evidence of mass lesion, ventriculomegaly, extra-axial collection or acute intracranial hemorrhage. Cervicomedullary junction and pituitary are within normal limits. No signal changes suspicious for vasogenic edema on this noncontrast exam. Some progressive vague periventricular white matter T2 and  FLAIR hyperintensity which is probably sequelae of XRT. No discrete cortical encephalomalacia or chronic cerebral blood products. Deep gray nuclei, brainstem, and cerebellar signal appears stable Vascular: Major intracranial vascular flow voids are stable since June. Skull and upper cervical spine: Stable and negative. Visualized bone marrow signal is within normal limits. Sinuses/Orbits: Stable and negative. Other: Mild left greater than right mastoid air cell effusions are stable to regressed. Negative visible scalp and face. IMPRESSION: No acute intracranial abnormality.  Mild sequelae of whole brain radiation with no strong evidence of metastatic disease on this noncontrast exam. Electronically Signed   By: Genevie Ann M.D.   On: 09/10/2021 09:11   CT Chest Wo Contrast  Result Date: 09/09/2021 CLINICAL DATA:  Pleural effusion. Malignancy suspected. History of lung cancer. EXAM: CT CHEST WITHOUT CONTRAST TECHNIQUE: Multidetector CT imaging of the chest was performed following the standard protocol without IV contrast. RADIATION DOSE REDUCTION: This exam was performed according to the departmental dose-optimization program which includes automated exposure control, adjustment of the mA and/or kV according to patient size and/or use of iterative reconstruction technique. COMPARISON:  Chest radiograph dated 09/09/2021. Chest CT dated 09/04/2021. FINDINGS: Evaluation of this exam is limited in the absence of intravenous contrast. Cardiovascular: There is no cardiomegaly or pericardial effusion. Advanced 3 vessel coronary vascular calcification. Mild atherosclerotic calcification of the thoracic aorta. The central pulmonary arteries are grossly unremarkable. Mediastinum/Nodes: Subcarinal adenopathy measures approximately 16 mm in short axis. Evaluation of the left hilar lymph nodes is limited due to consolidative changes of the left lung. Esophagus is grossly unremarkable. No mediastinal fluid collection. Lungs/Pleura: Background of moderate centrilobular emphysema. Moderate left pleural effusion with near complete consolidation of the left lower lobe and partial consolidation of the lingula similar to prior CT. There is post radiation changes in the left upper lobe similar to prior CT. Interval resolution of the previously seen right pleural effusion. No pneumothorax. The central airways remain patent. Upper Abdomen: High attenuating content within gallbladder, likely vicarious excretion of contrast. Musculoskeletal: Osteopenia with degenerative changes of the spine. T7 compression  fracture with greater than 50% loss of vertebral body height similar to prior CT. Fractures of the posterolateral ninth and eleventh rib on the right as seen on the prior CT. No new fracture. IMPRESSION: 1. Overall no significant interval change in the appearance of the lungs since the prior CT of 09/04/2021. 2. Moderate left pleural effusion with near complete consolidation of the left lower lobe and partial consolidation of the lingula similar to prior CT. 3. Post radiation changes in the left upper lobe similar to prior CT. 4. Interval resolution of the previously seen right pleural effusion. 5. Fractures of the posterolateral ninth and eleventh rib on the right as seen on the prior CT. No new fracture. 6. Aortic Atherosclerosis (ICD10-I70.0) and Emphysema (ICD10-J43.9). Electronically Signed   By: Anner Crete M.D.   On: 09/09/2021 20:15   CT Head Wo Contrast  Result Date: 09/09/2021 CLINICAL DATA:  Mental status change, unknown cause EXAM: CT HEAD WITHOUT CONTRAST TECHNIQUE: Contiguous axial images were obtained from the base of the skull through the vertex without intravenous contrast. RADIATION DOSE REDUCTION: This exam was performed according to the departmental dose-optimization program which includes automated exposure control, adjustment of the mA and/or kV according to patient size and/or use of iterative reconstruction technique. COMPARISON:  Head CT 09/30/2020, brain MRI 06/22/2021 FINDINGS: Brain: No intracranial hemorrhage, mass effect, or midline shift. No evidence of intracranial mass on this unenhanced exam.  No hydrocephalus. The basilar cisterns are patent. Mild periventricular chronic small vessel ischemia. No evidence of territorial infarct or acute ischemia. No extra-axial or intracranial fluid collection. Vascular: Atherosclerosis of skullbase vasculature without hyperdense vessel or abnormal calcification. Skull: No fracture or focal lesion. Sinuses/Orbits: Occasional opacification of  ethmoid air cells. Trace opacification of mastoid air cells. No sinus fluid levels. Other: None. IMPRESSION: 1. No acute intracranial abnormality. 2. Mild chronic small vessel ischemia. Electronically Signed   By: Keith Rake M.D.   On: 09/09/2021 18:46   DG Chest Portable 1 View  Result Date: 09/09/2021 CLINICAL DATA:  Shortness of breath EXAM: PORTABLE CHEST 1 VIEW COMPARISON:  Previous studies including the examination of 09/08/2021 FINDINGS: There is increased density in left lower lung field obscuring left hemidiaphragm. There are no signs of alveolar pulmonary edema in the parahilar regions. No new infiltrates are seen in right lung. IMPRESSION: There is interval increased opacity in the left lower lung fields suggesting increase in left pleural effusion and underlying atelectasis/pneumonia. Electronically Signed   By: Elmer Picker M.D.   On: 09/09/2021 15:42   US Thoracentesis Asp Pleural space w/IMG guide  Result Date: 09/08/2021 INDICATION: Patient with history of small cell lung cancer, dyspnea, left pleural effusion. Request received for diagnostic and therapeutic left thoracentesis. EXAM: ULTRASOUND GUIDED DIAGNOSTIC AND THERAPEUTIC LEFT THORACENTESIS MEDICATIONS: 8 mL 1% lidocaine COMPLICATIONS: None immediate. PROCEDURE: An ultrasound guided thoracentesis was thoroughly discussed with the patient and questions answered. The benefits, risks, alternatives and complications were also discussed. The patient understands and wishes to proceed with the procedure. Written consent was obtained. Ultrasound was performed to localize and mark an adequate pocket of fluid in the left chest. The area was then prepped and draped in the normal sterile fashion. 1% Lidocaine was used for local anesthesia. Under ultrasound guidance a 6 Fr Safe-T-Centesis catheter was introduced. Thoracentesis was performed. The catheter was removed and a dressing applied. FINDINGS: A total of approximately 750 cc of  yellow fluid was removed. Samples were sent to the laboratory as requested by the clinical team. Due to pt coughing only the above amount of fluid was removed today. IMPRESSION: Successful ultrasound guided diagnostic and therapeutic left thoracentesis yielding 750 cc of pleural fluid. Read by: Rowe Robert, PA-C Electronically Signed   By: Jerilynn Mages.  Shick M.D.   On: 09/08/2021 12:43   DG Chest 1 View  Result Date: 09/08/2021 CLINICAL DATA:  Status post thoracentesis. EXAM: CHEST  1 VIEW COMPARISON:  08/27/2021 FINDINGS: No evidence for pneumothorax. Left pleural effusion is decreased in the interval. Left mid lung and basilar atelectasis or scarring evident. Bones are diffusely demineralized. IMPRESSION: Interval decrease in left pleural effusion. No pneumothorax. Electronically Signed   By: Misty Stanley M.D.   On: 09/08/2021 11:48   CT Angio Chest PE W and/or Wo Contrast  Result Date: 09/04/2021 CLINICAL DATA:  Shortness of breath, cough, recent fall with 2 broken ribs and compression fracture, history of lung cancer EXAM: CT ANGIOGRAPHY CHEST WITH CONTRAST TECHNIQUE: Multidetector CT imaging of the chest was performed using the standard protocol during bolus administration of intravenous contrast. Multiplanar CT image reconstructions and MIPs were obtained to evaluate the vascular anatomy. RADIATION DOSE REDUCTION: This exam was performed according to the departmental dose-optimization program which includes automated exposure control, adjustment of the mA and/or kV according to patient size and/or use of iterative reconstruction technique. CONTRAST:  150mL OMNIPAQUE IOHEXOL 350 MG/ML SOLN COMPARISON:  Chest radiograph dated 09/04/2021. CT chest dated  09/01/2021. FINDINGS: Cardiovascular: Satisfactory opacification of the bilateral pulmonary arteries to the segmental level. No evidence of pulmonary embolism. Although not tailored for evaluation of the thoracic aorta, there is no evidence of thoracic aortic  aneurysm or dissection. The heart is top-normal in size.  No pericardial effusion. Three vessel coronary sclerosis. Mediastinum/Nodes: 14 mm short axis subcarinal node, suspicious. 11 mm short axis right hilar node. Visualized thyroid is unremarkable. Lungs/Pleura: Moderate left and trace right pleural effusions. Associated bilateral lower lobe compressive atelectasis. Radiation changes in the left upper lobe. Moderate centrilobular and paraseptal emphysematous changes, upper lung predominant. No pneumothorax. Upper Abdomen: Visualized upper abdomen is notable for vascular calcifications and vicarious excretion of contrast in the gallbladder. Musculoskeletal: Fractures of the right lateral 9th and 10th ribs, unchanged. Additional fracture of the right posteromedial 11th rib (series 4/image 110). Severe compression fracture deformity at T7, unchanged. Review of the MIP images confirms the above findings. IMPRESSION: No evidence of pulmonary embolism. Otherwise unchanged from recent CT. Moderate left and trace right pleural effusions. Associated bilateral lower lobe compressive atelectasis. Radiation changes in the left upper lobe. Mildly enlarged subcarinal and right hilar nodes, suspicious for nodal metastases. Right posterolateral 9th-11th rib fractures. Severe compression fracture deformity at T7. Aortic Atherosclerosis (ICD10-I70.0) and Emphysema (ICD10-J43.9). Electronically Signed   By: Julian Hy M.D.   On: 09/04/2021 20:27   DG Chest Port 1 View  Result Date: 09/04/2021 CLINICAL DATA:  Shortness of breath. EXAM: PORTABLE CHEST 1 VIEW COMPARISON:  August 23, 2021 FINDINGS: The heart size and mediastinal contours are within normal limits. There is moderate severity calcification of the aortic arch. There is stable opacification of left lung base with stable post treatment scarring seen along the left hilum. A stable moderate size left pleural effusion is also seen. The right lung is clear. No  pneumothorax is identified. The visualized skeletal structures are unremarkable. IMPRESSION: 1. Stable post treatment scarring seen along the left hilum. 2. Stable moderate size left pleural effusion and suspected left basilar consolidation. Electronically Signed   By: Virgina Norfolk M.D.   On: 09/04/2021 19:20   CT Chest W Contrast  Result Date: 09/04/2021 CLINICAL DATA:  Small-cell lung cancer restaging, chemotherapy complete * Tracking Code: BO * EXAM: CT CHEST WITH CONTRAST TECHNIQUE: Multidetector CT imaging of the chest was performed during intravenous contrast administration. RADIATION DOSE REDUCTION: This exam was performed according to the departmental dose-optimization program which includes automated exposure control, adjustment of the mA and/or kV according to patient size and/or use of iterative reconstruction technique. CONTRAST:  46mL OMNIPAQUE IOHEXOL 300 MG/ML  SOLN COMPARISON:  Rib radiographs, 08/23/2021, CT chest, 08/26/2021 07/06/2021, 05/29/2021 FINDINGS: Cardiovascular: Aortic atherosclerosis. Normal heart size. Three-vessel coronary artery calcifications. No pericardial effusion. Mediastinum/Nodes: Significant interval enlargement of subcarinal lymph nodes, measuring up to 2.7 x 1.7 cm, previously 2.0 x 1.0 cm on most recent PE examination dated 07/26/2021 (series 2, image 68). Thyroid gland, trachea, and esophagus demonstrate no significant findings. Lungs/Pleura: Moderate to severe centrilobular and paraseptal emphysema. Unchanged, moderate left pleural effusion associated atelectasis or consolidation. New, small right pleural effusion associated atelectasis or consolidation. Unchanged post treatment appearance of the perihilar and infrahilar left lung with fibrosis and volume loss (series 5, image 73). Upper Abdomen: No acute abnormality. Musculoskeletal: No chest wall abnormality. Mildly displaced acute fractures of the posterolateral right ninth and tenth ribs, as seen by prior  rib radiographs (series 2, image 122). High-grade sclerotic wedge deformity of T7, with increased height loss compared  to prior examination (series 7, image 82). IMPRESSION: 1. Significant interval enlargement of subcarinal lymph nodes, worrisome for recurrent nodal metastatic disease. 2. Unchanged post treatment/post radiation appearance of the perihilar and infrahilar left lung. 3. Unchanged, moderate left pleural effusion and associated atelectasis or consolidation. New, small right pleural effusion associated atelectasis or consolidation. 4. High-grade sclerotic wedge deformity of T7, with increased height loss compared to prior examination. 5. Mildly displaced acute fractures of the posterolateral right ninth and tenth ribs, as seen by prior dedicated rib radiographs. 6. Emphysema. 7. Coronary artery disease. Aortic Atherosclerosis (ICD10-I70.0) and Emphysema (ICD10-J43.9). Electronically Signed   By: Delanna Ahmadi M.D.   On: 09/04/2021 08:33    Microbiology: Results for orders placed or performed during the hospital encounter of 09/09/21  Blood culture (routine x 2)     Status: Abnormal   Collection Time: 09/09/21  3:17 PM   Specimen: BLOOD  Result Value Ref Range Status   Specimen Description BLOOD LEFT ANTECUBITAL  Final   Special Requests   Final    BOTTLES DRAWN AEROBIC AND ANAEROBIC Blood Culture adequate volume   Culture  Setup Time   Final    GRAM NEGATIVE RODS IN BOTH AEROBIC AND ANAEROBIC BOTTLES CRITICAL VALUE NOTED.  VALUE IS CONSISTENT WITH PREVIOUSLY REPORTED AND CALLED VALUE.    Culture (A)  Final    PASTEURELLA MULTOCIDA Usually susceptible to penicillin and other beta lactam agents,quinolones,macrolides and tetracyclines. HEALTH DEPARTMENT NOTIFIED Performed at [facility], [place] [address] [place], [place] [address]    Report Status 09/12/2021 FINAL  Final  Blood culture (routine x 2)     Status: Abnormal   Collection Time: 09/09/21  3:22 PM   Specimen: BLOOD   Result Value Ref Range Status   Specimen Description BLOOD RIGHT ANTECUBITAL  Final   Special Requests   Final    BOTTLES DRAWN AEROBIC AND ANAEROBIC Blood Culture adequate volume   Culture  Setup Time   Final    GRAM NEGATIVE RODS IN BOTH AEROBIC AND ANAEROBIC BOTTLES CRITICAL RESULT CALLED TO, READ BACK BY AND VERIFIED WITH: PHARMD HEATHER W [id number] FCP    Culture (A)  Final    PASTEURELLA MULTOCIDA Usually susceptible to penicillin and other beta lactam agents,quinolones,macrolides and tetracyclines. HEALTH DEPARTMENT NOTIFIED Performed at [facility], [place] [address], [place], [place] [address]    Report Status 09/18/2021 FINAL  Final  Blood Culture ID Panel (Reflexed)     Status: None   Collection Time: 09/09/21  3:22 PM  Result Value Ref Range Status   Enterococcus faecalis NOT DETECTED NOT DETECTED Final   Enterococcus Faecium NOT DETECTED NOT DETECTED Final   Listeria monocytogenes NOT DETECTED NOT DETECTED Final    Comment: CRITICAL RESULT CALLED TO, READ BACK BY AND VERIFIED WITH: PHARMD HEATHER W [id number] FCP    Staphylococcus species NOT DETECTED NOT DETECTED Final   Staphylococcus aureus (BCID) NOT DETECTED NOT DETECTED Final   Staphylococcus epidermidis NOT DETECTED NOT DETECTED Final   Staphylococcus lugdunensis NOT DETECTED NOT DETECTED Final   Streptococcus species NOT DETECTED NOT DETECTED Final   Streptococcus agalactiae NOT DETECTED NOT DETECTED Final   Streptococcus pneumoniae NOT DETECTED NOT DETECTED Final   Streptococcus pyogenes NOT DETECTED NOT DETECTED Final   A.calcoaceticus-baumannii NOT DETECTED NOT DETECTED Final   Bacteroides fragilis NOT DETECTED NOT DETECTED Final   Enterobacterales NOT DETECTED NOT DETECTED Final   Enterobacter cloacae complex NOT DETECTED NOT DETECTED Final  Escherichia coli NOT DETECTED NOT DETECTED Final   Klebsiella aerogenes NOT DETECTED NOT DETECTED Final   Klebsiella oxytoca NOT DETECTED NOT DETECTED  Final   Klebsiella pneumoniae NOT DETECTED NOT DETECTED Final   Proteus species NOT DETECTED NOT DETECTED Final   Salmonella species NOT DETECTED NOT DETECTED Final   Serratia marcescens NOT DETECTED NOT DETECTED Final   Haemophilus influenzae NOT DETECTED NOT DETECTED Final   Neisseria meningitidis NOT DETECTED NOT DETECTED Final   Pseudomonas aeruginosa NOT DETECTED NOT DETECTED Final   Stenotrophomonas maltophilia NOT DETECTED NOT DETECTED Final   Candida albicans NOT DETECTED NOT DETECTED Final   Candida auris NOT DETECTED NOT DETECTED Final   Candida glabrata NOT DETECTED NOT DETECTED Final   Candida krusei NOT DETECTED NOT DETECTED Final   Candida parapsilosis NOT DETECTED NOT DETECTED Final   Candida tropicalis NOT DETECTED NOT DETECTED Final   Cryptococcus neoformans/gattii NOT DETECTED NOT DETECTED Final    Comment: Performed at [facility], [place] [address], [place], [place] [address]  Expectorated Sputum Assessment w Gram Stain, Rflx to Resp Cult     Status: None   Collection Time: 09/10/21  1:31 PM   Specimen: Sputum  Result Value Ref Range Status   Specimen Description SPU  Final   Special Requests NONE  Final   Sputum evaluation   Final    Sputum specimen not acceptable for testing.  Please recollect.   Gram Stain Report Called to,Read Back By and Verified With: RN ACorinna Capra [phone] @2114  FH Performed at [facility], [place] [address] [place], [place] [address]    Report Status 09/12/2021 FINAL  Final  Urine Culture     Status: None   Collection Time: 09/11/21 10:10 AM   Specimen: Urine, Catheterized  Result Value Ref Range Status   Specimen Description URINE, CATHETERIZED  Final   Special Requests Normal  Final   Culture   Final    NO GROWTH Performed at [place] [address] [place], [place] [address]    Report Status 09/12/2021 FINAL  Final  Body fluid culture w Gram Stain     Status: None   Collection Time: 09/12/21  3:30 PM   Specimen:  Pleural Fluid  Result Value Ref Range Status   Specimen Description PLEURAL  Final   Special Requests NONE  Final   Gram Stain NO WBC SEEN NO ORGANISMS SEEN   Final   Culture   Final    NO GROWTH 3 DAYS Performed at [facility], [place] [address] [place], [place] [address]    Report Status 09/16/2021 FINAL  Final    Labs: CBC: Recent Labs  Lab 09/18/21 0408 09/18/21 1150 09/19/21 0454 09/20/21 0514 09/21/21 0336 09/22/21 0433  WBC 9.8  --  10.6* 10.9* 11.0* 10.8*  NEUTROABS 9.0*  --  9.8* 10.1* 10.1* 9.9*  HGB 7.9* 9.2* 8.1* 8.0* 8.2* 8.1*  HCT 23.2* 26.7* 23.1* 23.3* 24.5* 23.6*  MCV 104.0*  --  103.1* 104.5* 106.1* 105.4*  PLT 71*  --  67* 72* 72* 80*   Basic Metabolic Panel: Recent Labs  Lab 09/16/21 1256 09/17/21 0741 09/18/21 0408 09/20/21 0514  NA 133* 135 133* 136  K 2.6* 4.1 3.7 3.5  CL 93* 97* 99 100  CO2 25 24 26 27   GLUCOSE 123* 105* 103* 97  BUN 19 17 20 15   CREATININE 0.61 0.63 0.65 0.55  CALCIUM 8.0* 8.0* 7.9* 8.0*  CBG: Recent Labs  Lab 09/17/21 0609  GLUCAP 105*    Discharge time spent: 35 minutes.  Signed: Cordelia Poche, MD [facility] 09/23/2021

## 2021-09-26 ENCOUNTER — Ambulatory Visit: Payer: Medicare HMO | Admitting: Physician Assistant

## 2021-10-04 ENCOUNTER — Ambulatory Visit (HOSPITAL_COMMUNITY): Payer: Medicare HMO

## 2021-10-06 ENCOUNTER — Telehealth: Payer: Self-pay

## 2021-10-06 NOTE — Telephone Encounter (Signed)
Notification received from scheduling department that pts daughter left a message advising pt passed away 2021/10/06.

## 2021-10-08 DEATH — deceased

## 2021-10-09 ENCOUNTER — Inpatient Hospital Stay: Payer: Medicare HMO

## 2021-10-09 ENCOUNTER — Ambulatory Visit: Payer: Medicare HMO | Admitting: Radiation Oncology

## 2021-12-14 ENCOUNTER — Ambulatory Visit: Payer: Medicare HMO | Admitting: Student

## 2022-03-11 IMAGING — CR DG CHEST 2V
2 series · 2 of 2 positions shown · non-contrast
Comparison: Chest x-ray dated November 11, 2020

CLINICAL DATA: Chest pain

EXAM:
CHEST - 2 VIEW

[w chest lat]
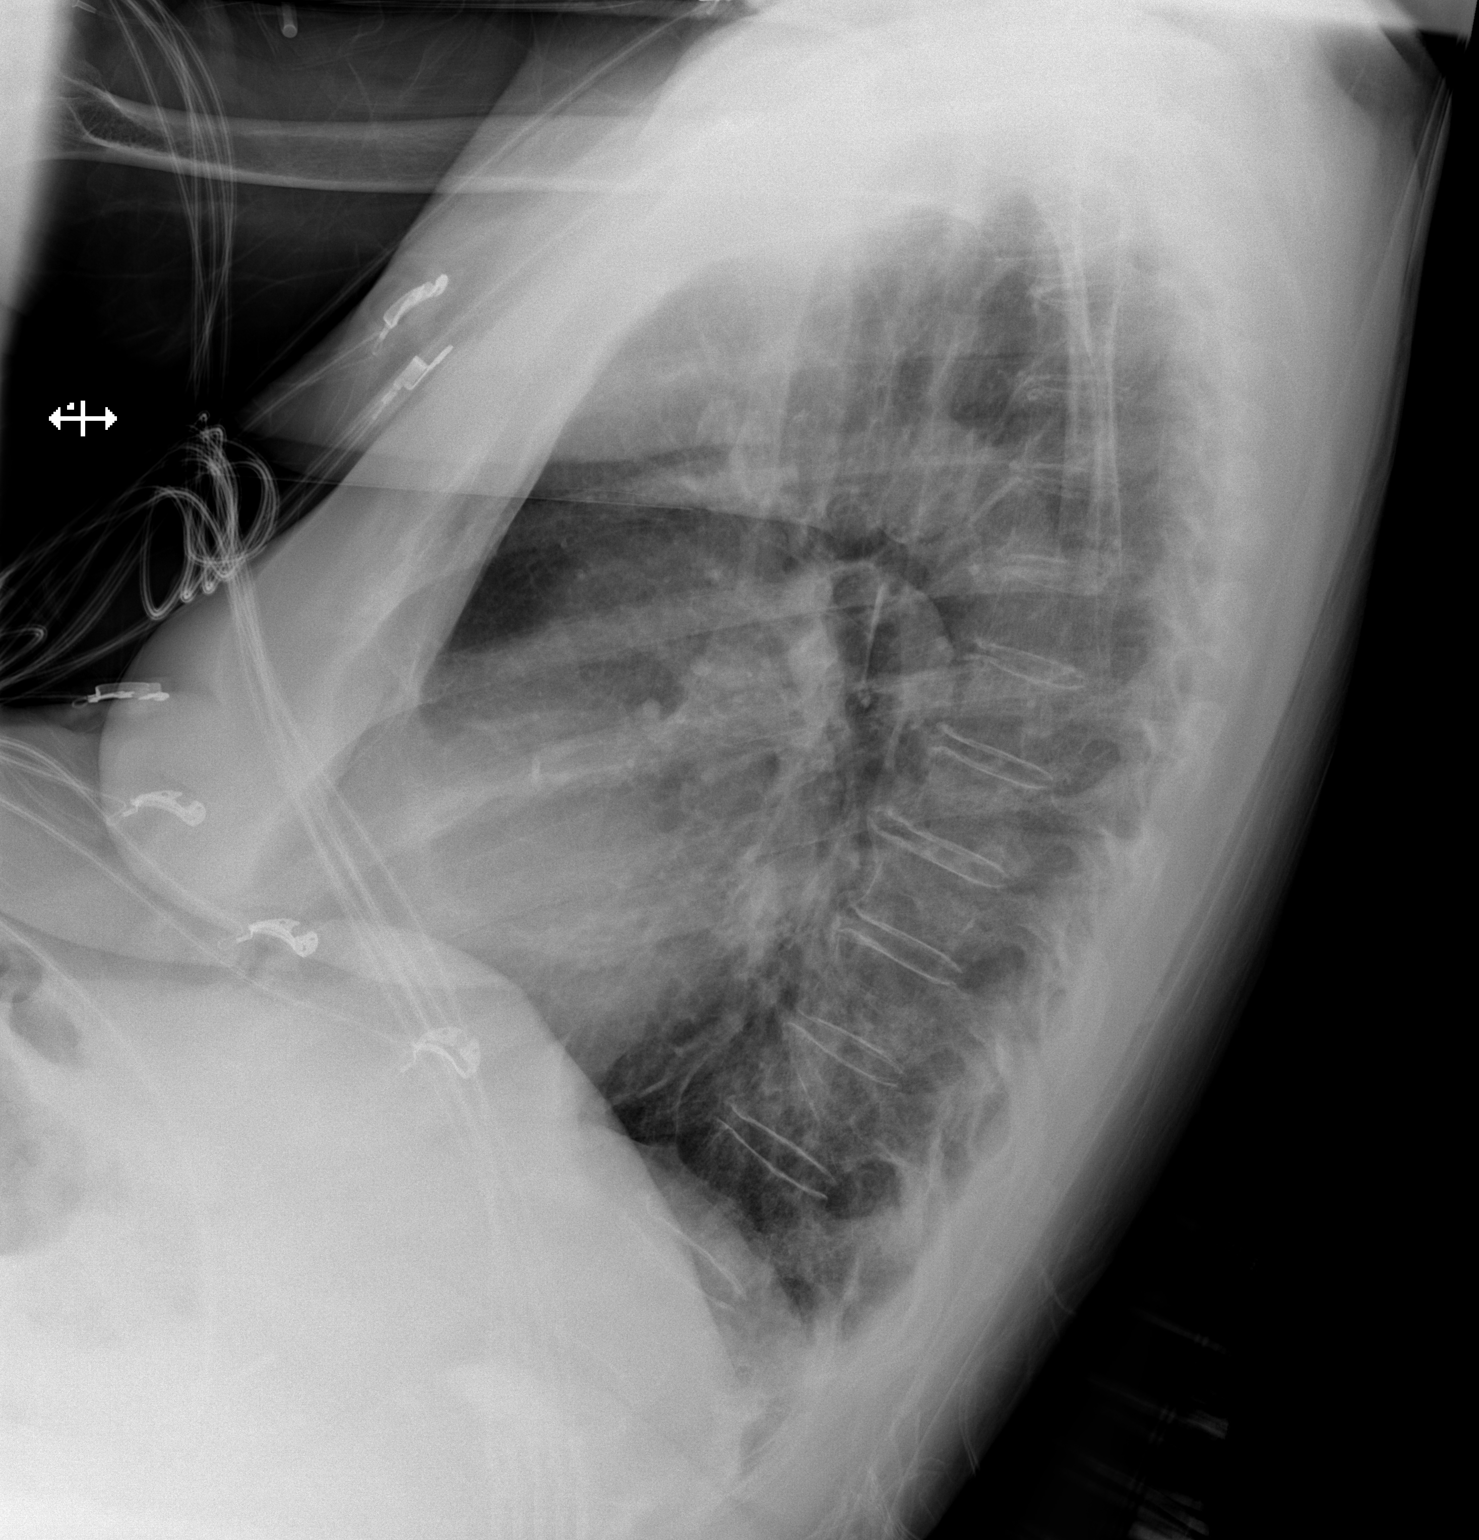

[x chest ap]
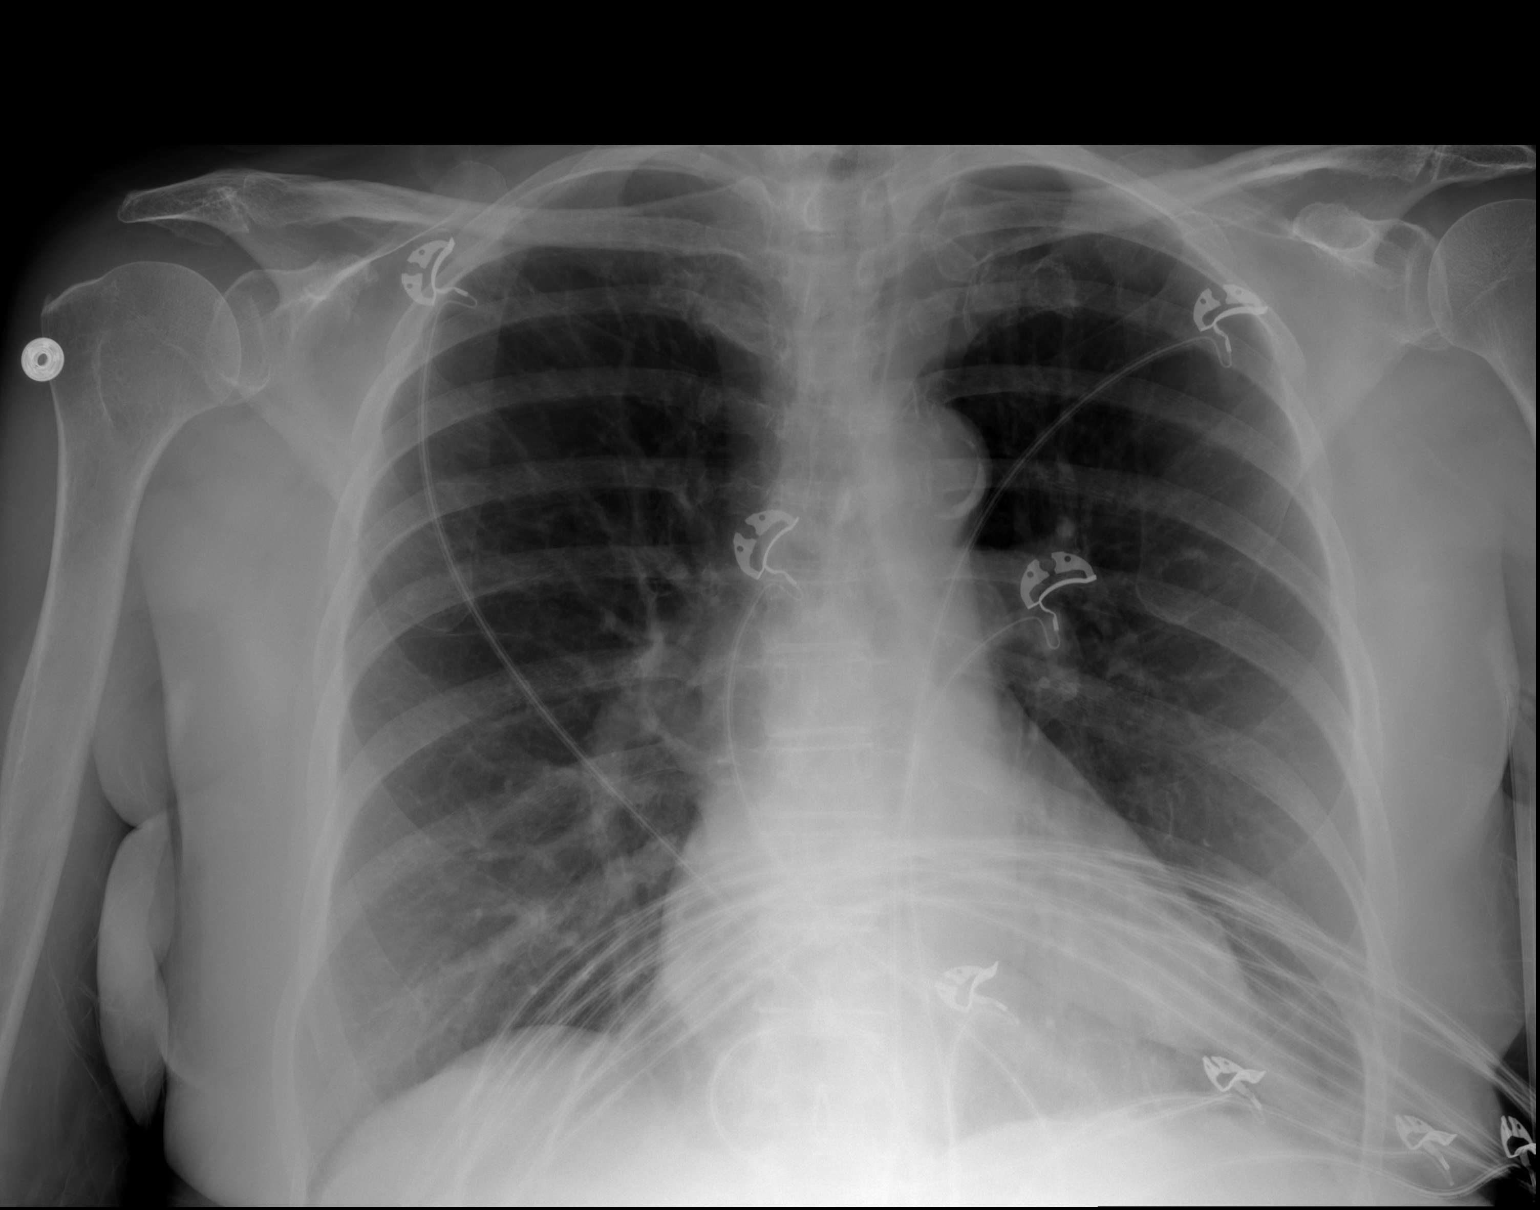

[2 of 2 positions shown; findings below may reference images not displayed]

FINDINGS: Cardiac and mediastinal contours are within normal limits. Mild left
basilar opacities. No focal consolidation. No pleural effusion or
pneumothorax.
IMPRESSION: Mild left basilar opacities, likely due to scarring or atelectasis.

## 2022-03-20 IMAGING — CT CT CHEST W/O CM
2 of 4 series · 15 of 36 positions shown, 18 images · non-contrast
Comparison: CT of the chest 12/08/2020.

CLINICAL DATA: Respiratory illness. Shortness of breath and
tachypnea. Ongoing treatments for lung cancer.

EXAM:
CT CHEST WITHOUT CONTRAST
TECHNIQUE: Multidetector CT imaging of the chest was performed following the
standard protocol without IV contrast.

[Series 2: thorax · axial · 0.67mm/px · z∈[-333,-43]mm · 12 of 173 slices shown, 15 images]
[im 14/173  mediastinal]
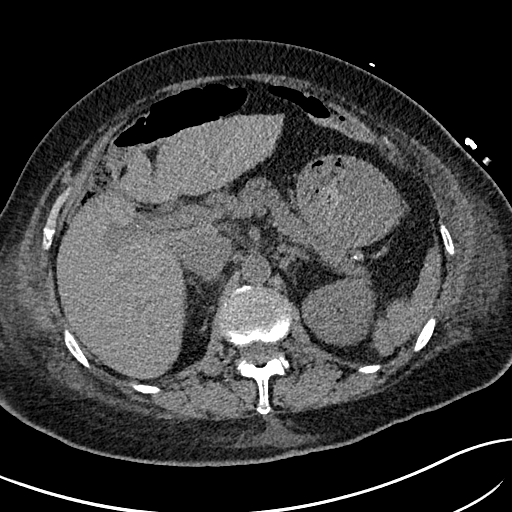
[im 14/173  lung]
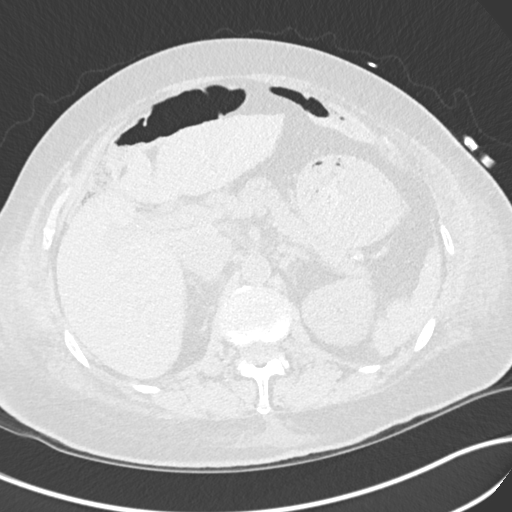
[im 27/173  lung]
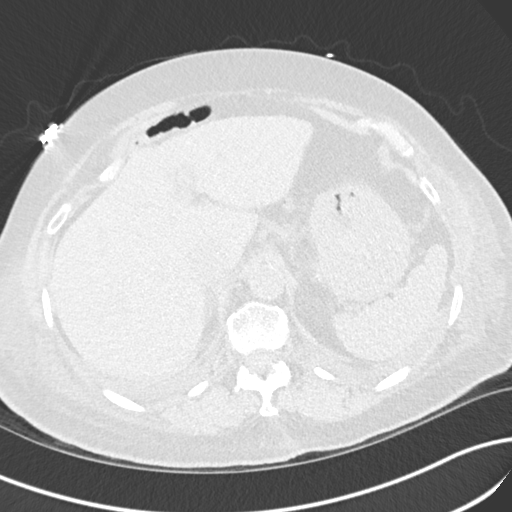
[im 40/173  lung]
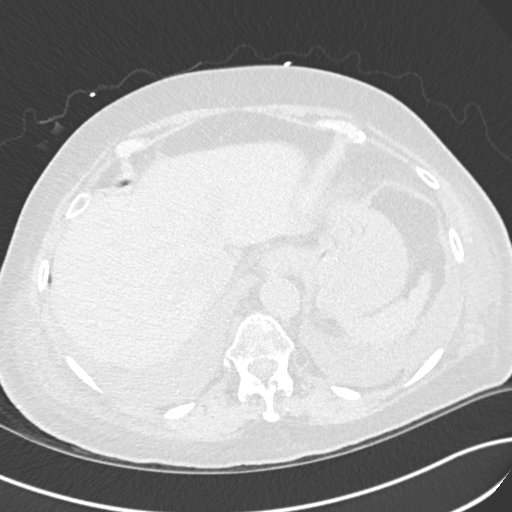
[im 53/173  lung]
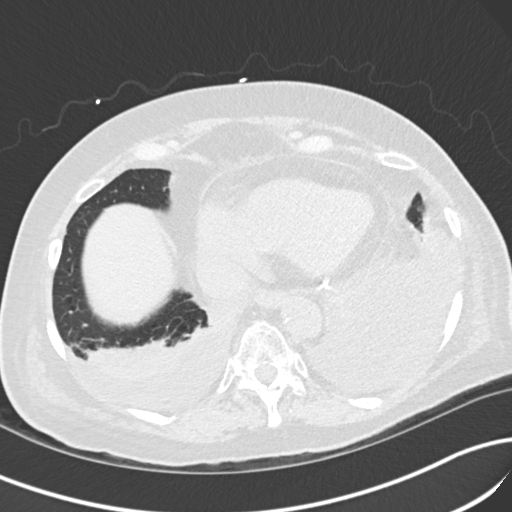
[im 67/173  mediastinal]
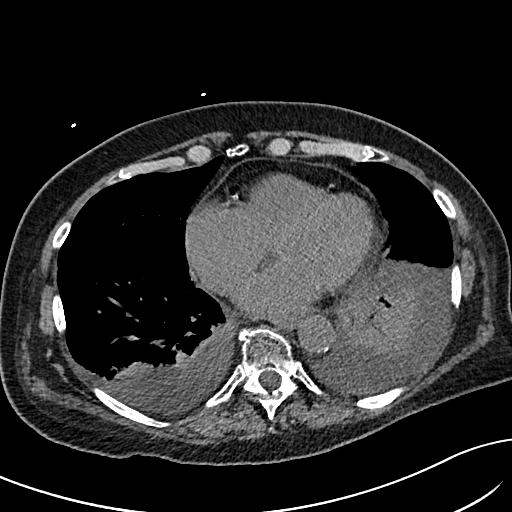
[im 67/173  lung]
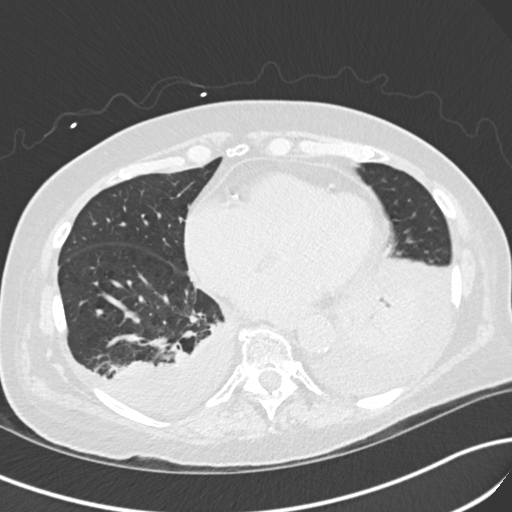
[im 80/173  lung]
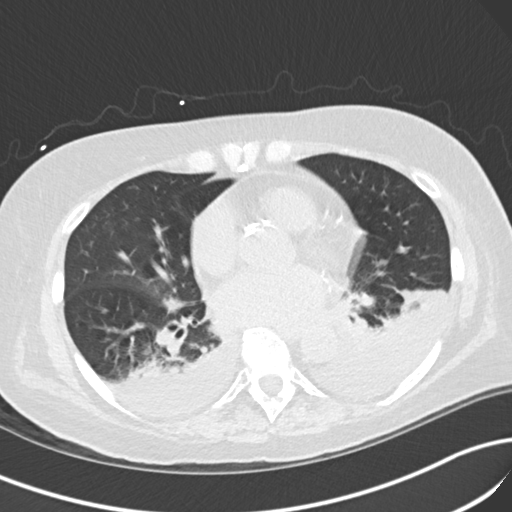
[im 93/173  lung]
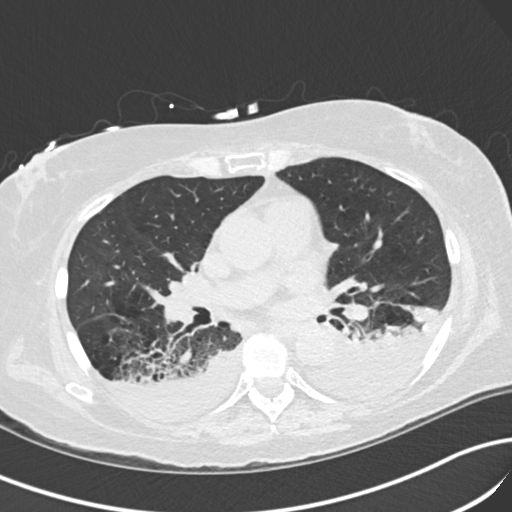
[im 106/173  lung]
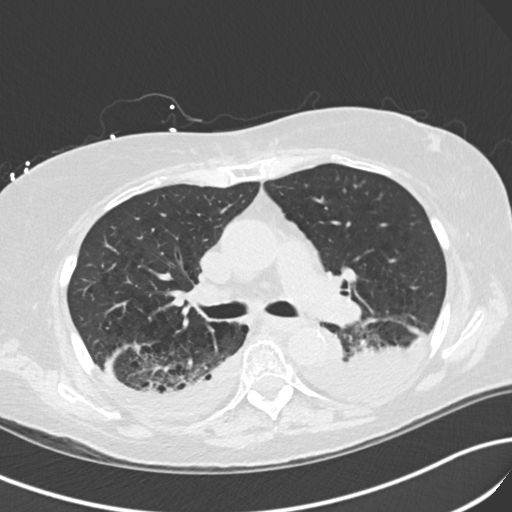
[im 120/173  mediastinal]
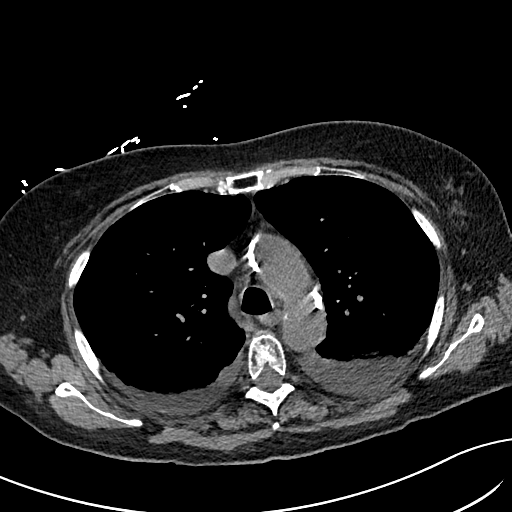
[im 120/173  lung]
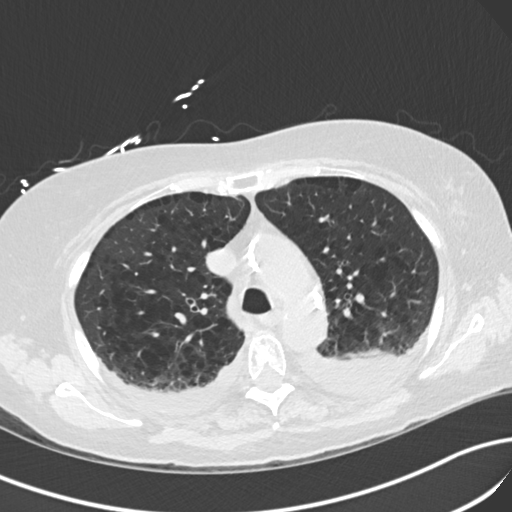
[im 133/173  lung]
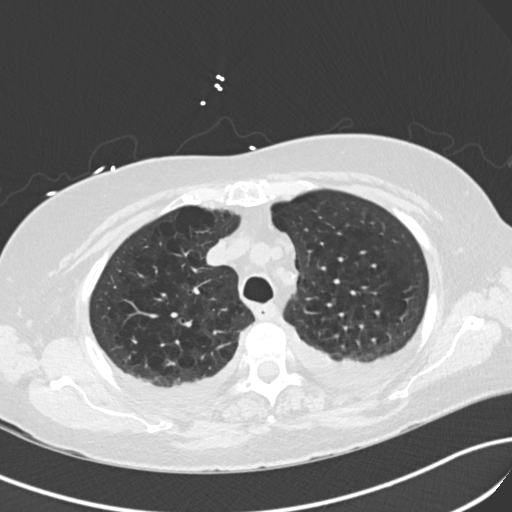
[im 146/173  lung]
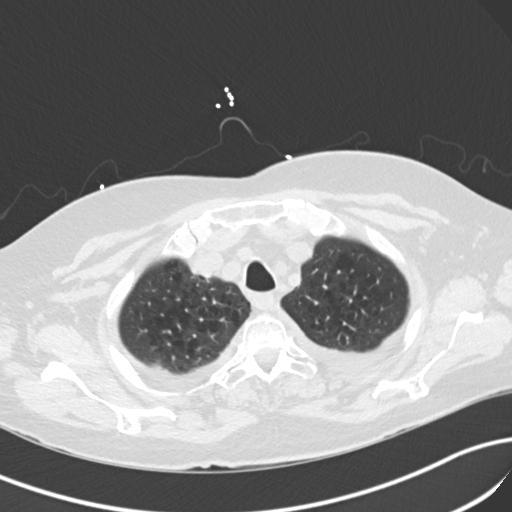
[im 159/173  lung]
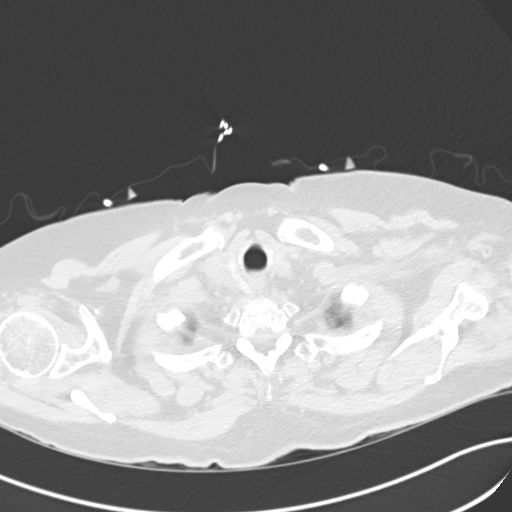

[Series 6: coronal · coronal · 0.68mm/px · 3 of 124 slices shown]
[im 25/124  lung]
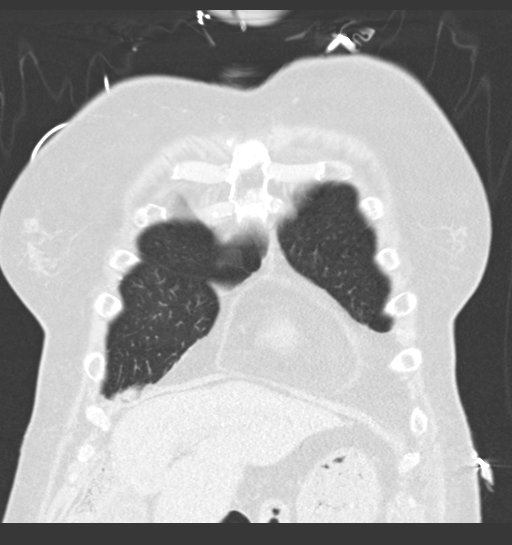
[im 50/124  lung]
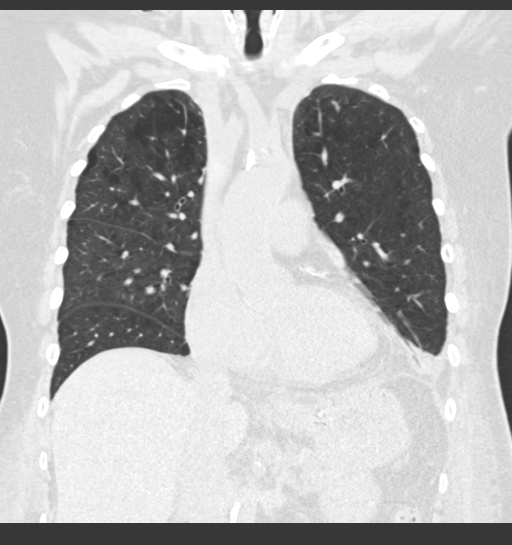
[im 74/124  lung]
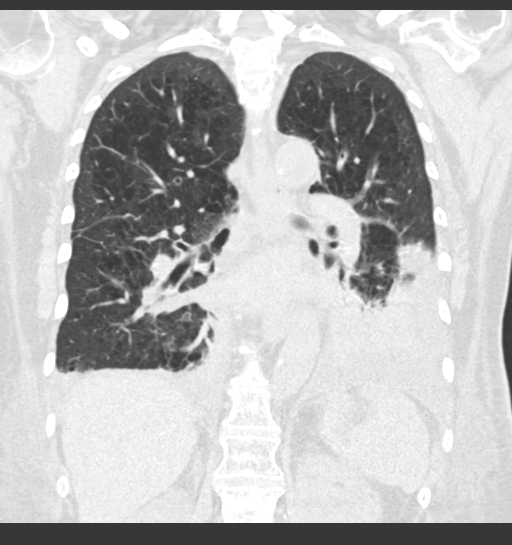

[15 of 36 positions shown; findings below may reference images not displayed]

FINDINGS: Cardiovascular: Heart is borderline enlarged, unchanged. Aorta is
normal in size. There are atherosclerotic calcifications of the
aorta and coronary arteries. There is no pericardial effusion.

Mediastinum/Nodes: There are no enlarged mediastinal or hilar lymph
nodes identified on this noncontrast study. The esophagus and
visualized thyroid gland are within normal limits.

Lungs/Pleura: There are new small bilateral pleural effusions. There
is new bilateral lower lobe atelectasis. There may be a small amount
of airspace consolidation in the left lower lobe. Moderate
emphysematous changes are again seen. There is no pneumothorax.
There are minimal secretions in the trachea. Trachea and central
airways appear patent.

Upper Abdomen: No acute abnormality.

Musculoskeletal: Vertebroplasty changes are seen at L2. No acute
fractures identified. There is a healed sternal fracture, unchanged.
There is mild body wall edema, new from prior.
IMPRESSION: 1. New small bilateral pleural effusions.
2. New bilateral lower lobe atelectasis. Can not exclude small
amount of airspace disease/pneumonia in the left lung base.
3. No new enlarged mediastinal or hilar lymph nodes allowing for
lack of intravenous contrast.
4. New body wall edema.
5. Aortic Atherosclerosis (1W1G0-W39.9) and Emphysema (1W1G0-QEC.F).
